# Patient Record
Sex: Male | Born: 1970 | Race: White | Hispanic: No | Marital: Married | Smoking: Current every day smoker
Health system: Southern US, Community
[De-identification: ages and names within clinical notes are randomized; demographics above are authoritative.]

## PROBLEM LIST (undated history)

## (undated) DIAGNOSIS — R002 Palpitations: Secondary | ICD-10-CM

## (undated) DIAGNOSIS — G8929 Other chronic pain: Secondary | ICD-10-CM

## (undated) DIAGNOSIS — D369 Benign neoplasm, unspecified site: Secondary | ICD-10-CM

## (undated) DIAGNOSIS — I456 Pre-excitation syndrome: Secondary | ICD-10-CM

## (undated) DIAGNOSIS — Z720 Tobacco use: Secondary | ICD-10-CM

## (undated) DIAGNOSIS — I10 Essential (primary) hypertension: Secondary | ICD-10-CM

## (undated) DIAGNOSIS — F419 Anxiety disorder, unspecified: Secondary | ICD-10-CM

## (undated) DIAGNOSIS — R160 Hepatomegaly, not elsewhere classified: Secondary | ICD-10-CM

## (undated) DIAGNOSIS — R000 Tachycardia, unspecified: Secondary | ICD-10-CM

## (undated) DIAGNOSIS — K649 Unspecified hemorrhoids: Secondary | ICD-10-CM

## (undated) DIAGNOSIS — K219 Gastro-esophageal reflux disease without esophagitis: Secondary | ICD-10-CM

## (undated) DIAGNOSIS — K760 Fatty (change of) liver, not elsewhere classified: Secondary | ICD-10-CM

## (undated) DIAGNOSIS — R55 Syncope and collapse: Secondary | ICD-10-CM

## (undated) DIAGNOSIS — I2510 Atherosclerotic heart disease of native coronary artery without angina pectoris: Secondary | ICD-10-CM

## (undated) DIAGNOSIS — M1990 Unspecified osteoarthritis, unspecified site: Secondary | ICD-10-CM

## (undated) DIAGNOSIS — G4731 Primary central sleep apnea: Secondary | ICD-10-CM

## (undated) HISTORY — DX: Palpitations: R00.2

## (undated) HISTORY — PX: ADENOIDECTOMY: SUR15

## (undated) HISTORY — DX: Hepatomegaly, not elsewhere classified: R16.0

## (undated) HISTORY — DX: Tachycardia, unspecified: R00.0

## (undated) HISTORY — PX: HAND SURGERY: SHX662

## (undated) HISTORY — PX: COLONOSCOPY: SHX174

## (undated) HISTORY — DX: Syncope and collapse: R55

## (undated) HISTORY — DX: Benign neoplasm, unspecified site: D36.9

## (undated) HISTORY — PX: APPENDECTOMY: SHX54

## (undated) HISTORY — DX: Unspecified hemorrhoids: K64.9

## (undated) HISTORY — PX: ATRIAL ABLATION SURGERY: SHX560

## (undated) HISTORY — DX: Anxiety disorder, unspecified: F41.9

## (undated) HISTORY — DX: Essential (primary) hypertension: I10

## (undated) HISTORY — PX: CARDIAC ELECTROPHYSIOLOGY STUDY AND ABLATION: SHX1294

## (undated) HISTORY — PX: SHOULDER SURGERY: SHX246

## (undated) HISTORY — PX: AV NODE ABLATION: SHX1209

## (undated) HISTORY — DX: Other chronic pain: G89.29

---

## 2000-02-10 ENCOUNTER — Encounter: Payer: Self-pay | Admitting: Emergency Medicine

## 2000-02-10 ENCOUNTER — Emergency Department (HOSPITAL_COMMUNITY): Admission: EM | Admit: 2000-02-10 | Discharge: 2000-02-10 | Payer: Self-pay | Admitting: Emergency Medicine

## 2002-04-04 ENCOUNTER — Emergency Department (HOSPITAL_COMMUNITY): Admission: EM | Admit: 2002-04-04 | Discharge: 2002-04-04 | Payer: Self-pay | Admitting: *Deleted

## 2002-04-04 ENCOUNTER — Encounter: Payer: Self-pay | Admitting: *Deleted

## 2004-08-24 ENCOUNTER — Inpatient Hospital Stay (HOSPITAL_COMMUNITY): Admission: AC | Admit: 2004-08-24 | Discharge: 2004-08-25 | Payer: Self-pay

## 2004-09-26 ENCOUNTER — Ambulatory Visit: Payer: Self-pay | Admitting: Internal Medicine

## 2004-10-22 ENCOUNTER — Ambulatory Visit: Payer: Self-pay | Admitting: Cardiology

## 2004-10-22 ENCOUNTER — Ambulatory Visit (HOSPITAL_COMMUNITY): Admission: RE | Admit: 2004-10-22 | Discharge: 2004-10-22 | Payer: Self-pay | Admitting: Internal Medicine

## 2004-11-14 ENCOUNTER — Encounter: Admission: RE | Admit: 2004-11-14 | Discharge: 2004-11-14 | Payer: Self-pay | Admitting: Orthopaedic Surgery

## 2004-11-16 ENCOUNTER — Encounter: Admission: RE | Admit: 2004-11-16 | Discharge: 2004-11-16 | Payer: Self-pay | Admitting: Orthopaedic Surgery

## 2004-12-03 ENCOUNTER — Encounter: Admission: RE | Admit: 2004-12-03 | Discharge: 2004-12-03 | Payer: Self-pay | Admitting: Orthopedic Surgery

## 2004-12-04 ENCOUNTER — Emergency Department (HOSPITAL_COMMUNITY): Admission: EM | Admit: 2004-12-04 | Discharge: 2004-12-04 | Payer: Self-pay | Admitting: Emergency Medicine

## 2005-08-15 ENCOUNTER — Ambulatory Visit: Payer: Self-pay | Admitting: Internal Medicine

## 2005-08-18 ENCOUNTER — Ambulatory Visit: Payer: Self-pay | Admitting: Cardiology

## 2005-09-15 DIAGNOSIS — I2510 Atherosclerotic heart disease of native coronary artery without angina pectoris: Secondary | ICD-10-CM

## 2005-09-15 HISTORY — DX: Atherosclerotic heart disease of native coronary artery without angina pectoris: I25.10

## 2005-10-06 ENCOUNTER — Ambulatory Visit (HOSPITAL_COMMUNITY): Admission: RE | Admit: 2005-10-06 | Discharge: 2005-10-06 | Payer: Self-pay | Admitting: Internal Medicine

## 2005-11-28 ENCOUNTER — Emergency Department (HOSPITAL_COMMUNITY): Admission: EM | Admit: 2005-11-28 | Discharge: 2005-11-29 | Payer: Self-pay | Admitting: Emergency Medicine

## 2006-04-04 ENCOUNTER — Inpatient Hospital Stay (HOSPITAL_COMMUNITY): Admission: EM | Admit: 2006-04-04 | Discharge: 2006-04-04 | Payer: Self-pay | Admitting: Internal Medicine

## 2006-04-04 ENCOUNTER — Ambulatory Visit: Payer: Self-pay | Admitting: *Deleted

## 2006-04-21 ENCOUNTER — Ambulatory Visit: Payer: Self-pay

## 2006-05-06 ENCOUNTER — Ambulatory Visit: Payer: Self-pay | Admitting: Internal Medicine

## 2006-05-14 ENCOUNTER — Inpatient Hospital Stay (HOSPITAL_BASED_OUTPATIENT_CLINIC_OR_DEPARTMENT_OTHER): Admission: RE | Admit: 2006-05-14 | Discharge: 2006-05-14 | Payer: Self-pay | Admitting: Cardiology

## 2006-05-14 ENCOUNTER — Ambulatory Visit: Payer: Self-pay | Admitting: Cardiology

## 2006-12-18 ENCOUNTER — Emergency Department (HOSPITAL_COMMUNITY): Admission: EM | Admit: 2006-12-18 | Discharge: 2006-12-18 | Payer: Self-pay | Admitting: Emergency Medicine

## 2008-02-09 ENCOUNTER — Encounter: Payer: Self-pay | Admitting: Cardiovascular Disease

## 2008-04-05 ENCOUNTER — Ambulatory Visit (HOSPITAL_COMMUNITY): Admission: RE | Admit: 2008-04-05 | Discharge: 2008-04-05 | Payer: Self-pay | Admitting: Urology

## 2008-04-12 ENCOUNTER — Ambulatory Visit: Payer: Self-pay | Admitting: Cardiovascular Disease

## 2008-05-07 ENCOUNTER — Ambulatory Visit: Payer: Self-pay | Admitting: Cardiology

## 2008-05-12 ENCOUNTER — Ambulatory Visit: Payer: Self-pay | Admitting: Cardiology

## 2008-12-23 DIAGNOSIS — Z8679 Personal history of other diseases of the circulatory system: Secondary | ICD-10-CM | POA: Insufficient documentation

## 2008-12-23 DIAGNOSIS — I10 Essential (primary) hypertension: Secondary | ICD-10-CM | POA: Insufficient documentation

## 2008-12-23 DIAGNOSIS — R55 Syncope and collapse: Secondary | ICD-10-CM | POA: Insufficient documentation

## 2008-12-23 DIAGNOSIS — R002 Palpitations: Secondary | ICD-10-CM

## 2008-12-23 DIAGNOSIS — F411 Generalized anxiety disorder: Secondary | ICD-10-CM | POA: Insufficient documentation

## 2009-01-26 ENCOUNTER — Encounter (INDEPENDENT_AMBULATORY_CARE_PROVIDER_SITE_OTHER): Payer: Self-pay | Admitting: *Deleted

## 2009-03-09 ENCOUNTER — Ambulatory Visit (HOSPITAL_COMMUNITY): Admission: RE | Admit: 2009-03-09 | Discharge: 2009-03-09 | Payer: Self-pay | Admitting: Family Medicine

## 2009-03-10 ENCOUNTER — Emergency Department (HOSPITAL_COMMUNITY): Admission: EM | Admit: 2009-03-10 | Discharge: 2009-03-10 | Payer: Self-pay | Admitting: Emergency Medicine

## 2009-03-17 ENCOUNTER — Emergency Department (HOSPITAL_COMMUNITY): Admission: EM | Admit: 2009-03-17 | Discharge: 2009-03-17 | Payer: Self-pay | Admitting: Emergency Medicine

## 2009-03-23 ENCOUNTER — Ambulatory Visit (HOSPITAL_COMMUNITY): Admission: RE | Admit: 2009-03-23 | Discharge: 2009-03-23 | Payer: Self-pay | Admitting: Internal Medicine

## 2009-04-17 ENCOUNTER — Ambulatory Visit (HOSPITAL_COMMUNITY): Admission: RE | Admit: 2009-04-17 | Discharge: 2009-04-17 | Payer: Self-pay | Admitting: Internal Medicine

## 2009-06-18 ENCOUNTER — Encounter: Payer: Self-pay | Admitting: Cardiovascular Disease

## 2009-07-11 ENCOUNTER — Encounter: Payer: Self-pay | Admitting: Cardiovascular Disease

## 2010-09-15 ENCOUNTER — Emergency Department (HOSPITAL_COMMUNITY)
Admission: EM | Admit: 2010-09-15 | Discharge: 2010-09-15 | Payer: Self-pay | Source: Home / Self Care | Admitting: Emergency Medicine

## 2010-10-06 ENCOUNTER — Encounter: Payer: Self-pay | Admitting: Urology

## 2010-10-06 ENCOUNTER — Encounter: Payer: Self-pay | Admitting: Internal Medicine

## 2010-10-17 NOTE — Consult Note (Signed)
Summary: [facility]   Imported By: Marylou Mccoy 09/25/2009 12:13:12  _____________________________________________________________________  External Attachment:    Type:   Image     Comment:   External Document

## 2010-10-17 NOTE — Consult Note (Signed)
Summary: [facility]   Imported By: Marylou Mccoy 09/25/2009 12:11:17  _____________________________________________________________________  External Attachment:    Type:   Image     Comment:   External Document

## 2010-10-23 ENCOUNTER — Encounter: Payer: Self-pay | Admitting: Internal Medicine

## 2010-10-23 ENCOUNTER — Other Ambulatory Visit: Payer: Medicare Other

## 2010-10-23 ENCOUNTER — Encounter (INDEPENDENT_AMBULATORY_CARE_PROVIDER_SITE_OTHER): Payer: Medicare Other | Admitting: Internal Medicine

## 2010-10-23 ENCOUNTER — Other Ambulatory Visit: Payer: Self-pay | Admitting: Internal Medicine

## 2010-10-23 DIAGNOSIS — R002 Palpitations: Secondary | ICD-10-CM

## 2010-10-23 DIAGNOSIS — R61 Generalized hyperhidrosis: Secondary | ICD-10-CM | POA: Insufficient documentation

## 2010-10-23 DIAGNOSIS — I10 Essential (primary) hypertension: Secondary | ICD-10-CM

## 2010-10-23 LAB — BASIC METABOLIC PANEL
BUN: 12 mg/dL (ref 6–23)
Chloride: 105 mEq/L (ref 96–112)
GFR: 94.87 mL/min (ref >60.00)
Potassium: 4.7 mEq/L (ref 3.5–5.1)
Sodium: 139 mEq/L (ref 135–145)

## 2010-10-23 LAB — MAGNESIUM: Magnesium: 2 mg/dL (ref 1.5–2.5)

## 2010-10-23 LAB — TSH: TSH: 1.57 u[IU]/mL (ref 0.35–5.50)

## 2010-10-31 NOTE — Assessment & Plan Note (Signed)
Summary: ec6- gd/per pt call/mb/rs from bumplist/gd**12/20 notes on fi...  Medications Added TOPROL XL 100 MG XR24H-TAB (METOPROLOL SUCCINATE) Take one by mouth two times a day LISINOPRIL 5 MG TABS (LISINOPRIL) Take one tablet by mouth daily KLOR-CON M20 20 MEQ CR-TABS (POTASSIUM CHLORIDE CRYS CR) take one tablet once daily      Allergies Added: ! MORPHINE  CC:  pt is having palpitations and a pounding feeling in his chest and reports that he is getting diaphoretic.  Marland Kitchen  History of Present Illness: Mr.Timothy [name] is seen as a consultation from Dr. Carlena Sax because of palpitations and syncope.  I met the patient many years ago and has seen occasionally over the years for similar complaints. Initially he was found to have supraventricular tachycardia and atrial fibrillation and underwent catheter ablation at [facility]. The records describe WPW; I don't remember whether he had concealed or manifest accessory pathways. He describes a second ablation procedure sometime thereafter and again I don't have records and I don't know what was done.  Over the last 15 years, however, he has been seen multiple times by me and colleagues because of palpitations chest pains dizziness. As best as I can tell from the records available, no arrhythmia has been documented.  He comes in today with complaints over the last 6 months his symptoms have worsened. He describes these as a large strong heartbeat which he has recognized for many many years followed by one of 2 palpitations syndrome. The first is a "strong heart heartbea" with which he is modestly symptomatic associated with fatiguethat lasts for the next day or so and a "weak heart beat" which is associated with nausea diaphoresis pallor and presyncope. He also had a syncopal episode with one of these events a couple of months ago. He ended up sitting down and did not collapse to the ground.  He also has a history of hypertension for which he is taking therapy with beta  blockers which have been used for the palpitations as well and a diuretic. He has a history of some type of edema and low potassium was also been on potassium supplementation.  Recent blood work has not been drawn that he recalls  He also notes that he isn't having night sweats for the last couple of months on his head he is soaking his garments   Current Medications (verified): 1)  Toprol Xl 100 Mg Xr24h-Tab (Metoprolol Succinate) .... Once Daily 2)  Aspirin Adult Low Strength 81 Mg Tbec (Aspirin) .... Once Daily 3)  Hydrochlorothiazide 25 Mg Tabs (Hydrochlorothiazide) .... Once Daily 4)  Klor-Con M20 20 Meq Cr-Tabs (Potassium Chloride Crys Cr) .... Take One Tablet Once Daily  Allergies (verified): 1)  ! Morphine  Past History:  Past Medical History: Last updated: 12/23/2008 PAST MEDICAL HISTORY:  1.  Tachycardia status post ablation x2.  2.  Palpitations/PVC's.  3.  History of syncope.  4.  Anxiety.  5.  Hypertension.  Family History: Last updated: 12/23/2008  Father with history of coronary artery disease status post CABG, not premature.  Social History: Last updated: 12/23/2008 Tobacco Use - Yes. --1/2 to 1 ppd  Past Surgical History: Ablation  Review of Systems       full review of systems was negative apart from a history of present illness and past medical history.   Vital Signs:  Patient profile:   40 year old male Height:      72 inches Weight:      193 pounds BMI:  26.27 Pulse rate:   78 / minute Pulse rhythm:   regular BP sitting:   142 / 84  (left arm) Cuff size:   regular  Vitals Entered By: Judithe Modest CMA (October 23, 2010 11:43 AM)  Physical Exam  General:  Alert and oriented middle-aged Caucasian male appearing his stated age in no acute distress. HEENT  normal . Neck veins were flat; carotids brisk and full without bruits. No lymphadenopathy. Back without kyphosis. Lungs clear. Heart sounds regular without murmurs or gallops. PMI  nondisplaced. Abdomen soft with active bowel sounds without midline pulsation or hepatomegaly. Femoral pulses and distal pulses intact. Extremities were without clubbing cyanosis or edemaSkin warm and drywith multiple tattoos Neurological exam grossly normal; affect is anxious appearing    EKG  Procedure date:  10/23/2010  Findings:      sinus rhythm at 78 And 12.09/2007/24 one Axis is 60  Impression & Recommendations:  Problem # 1:  PALPITATIONS (ICD-785.1) the patient has ongoing problems with palpitations. He is exceedingly anxious about this. He says he thinks he is going to die". I will use an event recorder to try to elucidate the mechanism of his palpitations. It sounds like he has PVCs. There may also be a secondary rhythm issue following.  Orders: Event (Event) TLB-BMP (Basic Metabolic Panel-BMET) (80048-METABOL) TLB-TSH (Thyroid Stimulating Hormone) (84443-TSH) TLB-Magnesium (Mg) (83735-MG)  Problem # 2:  SYNCOPE (ICD-780.2) He has had recurrent syncope and dizziness. There are accompanying epiphenomenon suggesting early mediated syndrome which may be provoked by an arrhythmia if one can be identified or other non-arrhythmic triggers. Because of the potential for intravascular depletion to be provocative, we will discontinue his hydrochlorothiazide and replace it with lisinopril for his blood pressure. At his next visit he will need a metabolic profile. His updated medication list for this problem includes:    Toprol Xl 100 Mg Xr24h-tab (Metoprolol succinate) .Marland Kitchen... Take one by mouth two times a day    Aspirin Adult Low Strength 81 Mg Tbec (Aspirin) ..... Once daily    Lisinopril 5 Mg Tabs (Lisinopril) .Marland Kitchen... Take one tablet by mouth daily  Problem # 3:  NIGHT SWEATS (ICD-780.8) with his night sweats and his increased arrhythmia we will check his thyroid panel. I will forward these results to Dr. Carlena Sax  Problem # 4:  ANXIETY STATE, UNSPECIFIED (ICD-300.00) This is  undoubtedly a huge part of the problem. In the event that no arrhythmias identified, further therapy for anxiety will be paramount. Even if arrhythmias identified, I would suggest biofeedback or some type of other anti-anxiety therapy to help him live with the problem.  Other Orders: Pulmonary Referral (Pulmonary)  Patient Instructions: 1)  Your physician recommends that you schedule a follow-up appointment in: 5 weeks 2)  Your physician has recommended you make the following change in your medication: Stop Hydrochlorothiazide.  Start Lisinopril 5 mg by mouth daily. 3)  Your physician has recommended that you wear an event monitor.  Event monitors are medical devices that record the heart's electrical activity. Doctors most often use these monitors to diagnose arrhythmias. Arrhythmias are problems with the speed or rhythm of the heartbeat. The monitor is a small, portable device. You can wear one while you do your normal daily activities. This is usually used to diagnose what is causing palpitations/syncope (passing out). 4)  Your physician has recommended that you have a sleep study.  This test records several body functions during sleep, including:  brain activity, eye movement, oxygen and carbon dioxide  blood levels, heart rate and rhythm, breathing rate and rhythm, the flow of air through your mouth and nose, snoring, body muscle movements, and chest and belly movement. Prescriptions: LISINOPRIL 5 MG TABS (LISINOPRIL) Take one tablet by mouth daily  #30 x 11   Entered by:   Dossie Arbour, RN, BSN   Authorized by:   Nathen May, MD, [facility]   Signed by:   Dossie Arbour, RN, BSN on 10/23/2010   Method used:   Electronically to        [facility]. [address] [phone]* (retail)       [address] [place], [place]  [address]       Ph: [phone]       Fax: [phone]   RxID:   [id number]   Appended Document: ec6- gd/per pt call/mb/rs from bumplist/gd**12/20 notes on  fi... also spent about 5 min discussing stompping smoking

## 2010-11-06 ENCOUNTER — Ambulatory Visit (INDEPENDENT_AMBULATORY_CARE_PROVIDER_SITE_OTHER): Payer: Medicare Other | Admitting: Pulmonary Disease

## 2010-11-06 ENCOUNTER — Encounter: Payer: Self-pay | Admitting: Pulmonary Disease

## 2010-11-06 DIAGNOSIS — R61 Generalized hyperhidrosis: Secondary | ICD-10-CM

## 2010-11-06 DIAGNOSIS — R55 Syncope and collapse: Secondary | ICD-10-CM

## 2010-11-12 NOTE — Assessment & Plan Note (Signed)
Summary: [facility] APPT ONLY/CB   Allergies: 1)  ! Morphine   Sleep Study  Procedure date:  11/07/2010  Findings:      Portable sleep study FLow evaluation time - 3H (6A to 9A) AHI 10/h indicative of mild sleep disordered brathing but predominant central apneas & a pattern s/o cheyne stokes' respiration. This can be seen in patients  with h/o heart failure or stroke.Please corelate clinically. Suggest in lab polysomnogram to further investigate.  Other Orders: Sleep Std Airflow/Heartrate and O2 SAT unattended ([address])

## 2010-11-15 ENCOUNTER — Encounter (INDEPENDENT_AMBULATORY_CARE_PROVIDER_SITE_OTHER): Payer: Self-pay | Admitting: *Deleted

## 2010-11-21 NOTE — Procedures (Signed)
Summary: [facility]  [facility]   Imported By: Lester  11/14/2010 10:33:26  _____________________________________________________________________  External Attachment:    Type:   Image     Comment:   External Document

## 2010-11-21 NOTE — Letter (Signed)
Summary: Appointment - Reschedule  [facility], Main Office  [address]   [place], [place] [address]   Phone: [phone]  Fax: [phone]     November 15, 2010 MRN: [id number]   Timothy [name] [address] [place], [place]  [address]   Dear Mr. BOB,   Due to a change in our office schedule, your appointment on    12-05-2010                at 1:55 p.m.   must be changed.  It is very important that we reach you to reschedule this appointment. We look forward to participating in your health care needs. Please contact us at the number listed above at your earliest convenience to reschedule this appointment.     Sincerely,     Lorne Skeens  [facility] Scheduling Team

## 2010-11-22 ENCOUNTER — Encounter: Payer: Self-pay | Admitting: Internal Medicine

## 2010-12-05 ENCOUNTER — Ambulatory Visit: Payer: Medicare Other | Admitting: Internal Medicine

## 2010-12-26 ENCOUNTER — Telehealth: Payer: Self-pay | Admitting: *Deleted

## 2010-12-26 NOTE — Telephone Encounter (Signed)
LEFT MESSAGE RE MONITOR RESULTS PER DR Graciela Husbands PVC'S ONLY  .Timothy [name]

## 2010-12-26 NOTE — Telephone Encounter (Signed)
lmtcb ./cy 

## 2010-12-27 NOTE — Telephone Encounter (Signed)
LMTCB ./CY 

## 2010-12-30 ENCOUNTER — Encounter: Payer: Self-pay | Admitting: *Deleted

## 2010-12-30 NOTE — Telephone Encounter (Signed)
SEVERAL ATTEMPTS HAVE BEEN MADE VIA PHONE WITH MONITOR RESULTS  NOTE MAILED TO PT WITH RESULTS./CY

## 2010-12-31 ENCOUNTER — Encounter: Payer: Self-pay | Admitting: Internal Medicine

## 2011-01-28 NOTE — Letter (Signed)
May 12, 2008    Madelin Rear. Sherwood Gambler, MD  [address]  [place], [place] [address]   RE:  ADRIC, WREDE  MRN:  [id number]  /  DOB:  1971/05/23   Dear Peyton Najjar,   It was my pleasure evaluating Mr. Timothy [name] in the office today in  consultation at your request for palpitations and episodic diaphoresis  and dyspnea.  Mr. Samaan has previously been followed by Dr. Graciela Husbands.  He  has a history of PSVT for which two ablation procedures were performed  at [facility] in 90s.  He subsequently has intermittently been  troubled by palpitations and chest pain.  He was completely evaluated in  mid 2007 with an event recorder and cardiac catheterization.  No  significant arrhythmias were diagnosed.  He was found to have  nonobstructive coronary artery disease with a 50% lesion in the first  diagonal and 20-30% lesions elsewhere.  He had been maintained on  disability for approximately 8 years.  Dr. Graciela Husbands advised him to return  to work.  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  He describes episodes where he feels a pulse  deficit in his wrist.  There are times when he becomes diaphoretic and  dyspneic.  The relationship between his apparent arrhythmia and those  symptoms is not entirely clear.  Recent blood work included a CBC,  chemistry profile, TSH, cortisol levels, urine studies for carcinoid and  urine studies for pheochromocytoma.  Results were essentially negative.  He had mildly low p.m. cortisol.   Past medical history is otherwise notable for hypertension, which has  reportedly been difficult to control at times.  Blood pressures have  always been well within the normal range when seen in the office.  He  has depression.  He is a cigarette smoker with significant continuing  consumption.  He had an implanted loop recorder in 2006 that apparently  was unproductive.   Current medications include,  1. Toprol-XL 100 mg b.i.d.  2. Aspirin 81 mg  daily.  3. Hydrochlorothiazide 25 mg daily.  4. Wellbutrin 150 mg daily.  5. KCl 20 mEq daily.  6. Celexa 40 mg daily.   He has no known drug allergies.   Review of systems, past medical, social history and family history were  updated.  There are no notable additions except as described above.   PHYSICAL EXAMINATION:  GENERAL:  On exam, anxious and somewhat depressed-  appearing gentleman in no acute distress.  VITAL SIGNS:  The weight is 215, 32 pounds more than in 2006.  Blood  pressure 105/70, heart rate 65 and regular, respirations 16.  HEENT:  EOMs full; normal oral mucosa.  NECK:  No jugular venous distention; normal carotid upstrokes without  bruits.  ENDOCRINE:  No thyromegaly.  HEMATOPOIETIC:  No adenopathy.  LUNGS:  Clear.  CARDIAC:  Normal first and second heart sounds.  ABDOMEN:  Soft and nontender; normal bowel sounds; no organomegaly.  EXTREMITIES:  No edema; normal distal pulses.   Mr. Tabbert carried an event recorder for 3 weeks.  He reported no  symptoms during that interval.  His rhythm was sinus throughout except  for a single missed beats with a 2.3-second pause.  This appeared to  represent either sinus exit block or sinus pause.   IMPRESSION:  Mr. Steeves has a remote history of Wolff-Parkinson-White,  paroxysmal supraventricular tachycardia, and atrial fibrillation.  It  appears that these arrhythmias were cured with radiofrequency ablation.  He has had subsequent chest pain, palpitations, and dyspnea for which no  specific etiology has been apparent.  I suggest that most of this is due  to anxiety.  That was also Dr. Odessa Fleming impression when Mr. Charette was  last seen in 2007.  Since he has had some bradycardia with Toprol, his  dose will be decreased to 100 mg daily.  Since he did have  nonobstructive coronary artery disease and has a mildly abnormal lipid  profile, treatment with a statin is warranted.  He will start  simvastatin at a dose of 40 mg  daily.  We will check a lipid panel now  in a few months from now at which time, I will see him again in the  office.  He will call for any more impressive symptoms.    Sincerely,      Gerrit Friends. Dietrich Pates, MD, [facility]  Electronically Signed    RMR/MedQ  DD: 05/12/2008  DT: 05/12/2008  Job #: [id number]

## 2011-01-31 NOTE — Discharge Summary (Signed)
NAMETHELBERT, GARTIN               ACCOUNT NO.:  [id number]   MEDICAL RECORD NO.:  [id number]          PATIENT TYPE:  INP   LOCATION:  3709                         FACILITY:  [facility]   PHYSICIAN:  Doylene Canning. Ladona Ridgel, M.D.  DATE OF BIRTH:  1971-08-11   DATE OF ADMISSION:  04/04/2006  DATE OF DISCHARGE:  04/04/2006                                 DISCHARGE SUMMARY   SUMMARY OF HISTORY:  Mr. Schoeneck is a 40 year old male who presented to the  emergency room stating that he had not felt right all day and described some  diaphoresis and episodes of palpitations.  He stated that he fell against  his dresser and dislocated the finger on his left hand.  He did not have  true loss of consciousness.  He did describe some chest discomfort with  palpitations but denied shortness of breath.  Since that time he has not had  any further problems with chest discomfort or palpitations and feels like he  is at his baseline now.   PAST MEDICAL HISTORY:  Notable for some type of tachyarrhythmia ablation at  [facility] x2, history of anxiety, syncope and hypertension   LABORATORY DATA:  Chest x-ray did not show any active disease.  EKG showed  normal sinus rhythm, normal axis, early R-wave, nonspecific ST-T wave  changes.  Admission H&H was 14.8 and 43.0, normal indices, platelets 309,  WBC 8.7.  Sodium 137, potassium 3.1, BUN 18, creatinine 0.9, glucose 92.  Subsequent potassium on the day of discharge was 3.3.  CK, MB and troponins  were negative x2.  BNP was 51.  Total cholesterol was 204, triglycerides  108, HDL 41, LDL 141.   HOSPITAL COURSE:  Overnight Mr. Mitten did not have any further chest  discomfort or palpitations.  Telemetry did not show any evidence of  dysrhythmias.  After review, Dr. Ladona Ridgel felt that the patient could be  discharged home on his home medications with the addition of potassium given  his hypokalemia.   DISCHARGE DIAGNOSES:  1.  Near syncope.  2.  Hypokalemia.  3.   Tobacco use.   DISPOSITION:  The patient is discharged home.  He is asked to maintain low  salt, fat and cholesterol diet.  His activities are not restricted.  He was  asked to bring all his medications to all appointments.   His medications include:  1.  Wellbutrin 150 mg daily.  2.  Hydrochlorothiazide 25 mg daily.  3.  Toprol XL 100 mg b.i.d.  4.  He received a new prescription for potassium 20 mEq daily.   He was advised no smoking or tobacco products.  I have faxed the office our  request to call the patient with arrangements for an outpatient stress  Myoview.  This could be done either in [facility] or with [facility] with  subsequent follow-up.  We have also asked him to call Dr. Sherwood Gambler for a 1-2  weeks appointment.  Our cardiology office will need to obtain the records  from [facility] in regards to his past medical history.      Joellyn Rued,  P.A. [facility]    ______________________________  Doylene Canning. Ladona Ridgel, M.D.    EW/MEDQ  D:  04/04/2006  T:  04/04/2006  Job:  [id number]
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
## 2011-01-31 NOTE — H&P (Signed)
Timothy [name], Timothy [name]               ACCOUNT NO.:  [id number]   MEDICAL RECORD NO.:  [id number]          PATIENT TYPE:  INP   LOCATION:  3709                         FACILITY:  [facility]   PHYSICIAN:  Rod Holler, MD      DATE OF BIRTH:  1971/01/11   DATE OF ADMISSION:  04/04/2006  DATE OF DISCHARGE:                                HISTORY & PHYSICAL   CHIEF COMPLAINT:  Chest tightness.   HISTORY OF PRESENT ILLNESS:  Timothy [name] is a 40 year old male with a  pertinent past medical history of an ablation for some type of tachycardia  performed at [facility] twice in the past. He presented to an outside  hospital tonight with a complaint of near syncope. The patient had not felt  right all day, felt somewhat diaphoretic during the day, and had had  episodes of palpitations during the day. This afternoon, the patient had an  episode of near syncope, no true loss of consciousness, nearly passed out,  and fell against his dresser. When he did that, the patient dislocated his  finger on this left hand. As stated above, the patient did not have a true  loss of consciousness. With this episode, he did have some chest tightness,  along with palpitations. He had no shortness of breath. Since that time, he  has had no further episodes of palpitations and no further chest discomfort.  The patient states that he feels like he is at his baseline now. The patient  has not had any episode of feeling light headed when he goes from the  sitting to the standing position. The patient has had no GI or other  illnesses in the past couple of days.   PAST MEDICAL HISTORY:  1.  Tachycardia status post ablation x2.  2.  Palpitations/PVC's.  3.  History of syncope.  4.  Anxiety.  5.  Hypertension.   MEDICATIONS:  1.  Wellbutrin 150 mg p.o. daily.  2.  Hydrochlorothiazide 25 mg p.o. daily.  3.  Toprol XL 100 mg p.o. b.i.d.   ALLERGIES:  NO KNOWN DRUG ALLERGIES.   SOCIAL HISTORY:  The patient smokes  1/2 to 1 pack per day.   FAMILY HISTORY:  Father with history of coronary artery disease status post  CABG, not premature.   REVIEW OF SYSTEMS:  All systems reviewed in details and are negative except  as noted in the history of present illness.   PHYSICAL EXAMINATION:  VITAL SIGNS:  Temperature 97.9, heart rate 90,  respiratory rate 18, blood pressure 144/80. Oxygen saturation 97% on room  air.  GENERAL:  A well developed, well nourished male. Alert and oriented times  three. No apparent distress.  HEENT:  Normocephalic and atraumatic. Pupils are equal, round, and reactive  to light. Extraocular movements intact. Oropharynx clear.  NECK:  Supple. No adenopathy, no JVD, no carotid bruits.  CHEST:  Lungs clear to auscultation bilaterally with equal bilateral breath  sounds.  CARDIOVASCULAR:  Regular rhythm. Normal rate. Normal S1 and S2. A 1 to 2  over 6 systolic ejection murmur,  2+ peripheral  pulses. No femoral bruits.  ABDOMEN:  Soft, nontender, and nondistended. Active bowel sounds. No  hepatosplenomegaly.  EXTREMITIES:  No clubbing, cyanosis, or edema.  NEUROLOGIC:  No focal deficits.  SKIN:  No rashes.   LABORATORY DATA:  EKG shows a normal sinus rhythm without any ST or T wave  changes.   At the outside hospital, white blood cell count 8.7. Hematocrit 43. Platelet  count 309,000. Sodium 131, potassium 3.1, chloride 99, bicarb 25, BUN 18,  creatinine 0.9, glucose 92. Myoglobin 59, CK MB 2.4, troponin less than  0.05.   IMPRESSION:  Timothy [name] is a 40 year old male admitted with an episode of  chest discomfort, palpitations, and episode of near-syncope.   PLAN:  1.  CARDIOVASCULAR:  Will admit the patient to a telemetry bed, rule out      with serial cardiac enzymes, place him on his home dose of      hydrochlorothiazide and Toprol XL. Will place him on an aspirin daily.      Will get a lipid panel, and daily EKG, along with an EKG upon admission      to the hospital.   2.  PSYCHIATRIC:  Will continue the patient on his home dose of Wellbutrin.  3.  PULMONARY:  Will get a PA and lateral chest x-ray.  4.  ENDOCRINE:  Check thyroid function test.  5.  FLUIDS FOR ELECTROLYTES AND NUTRITION:  The patient's potassium was 3.1      at the outside hospital and was given oral potassium there. We will      recheck a BMP in the morning along with checking a magnesium level.  6.  PROPHYLAXIS:  Since the patient had an episode of near syncope at home,      we will make the patient a fall risk.      Rod Holler, MD  Electronically Signed     TRK/MEDQ  D:  04/04/2006  T:  04/04/2006  Job:  [id number]

## 2011-01-31 NOTE — Cardiovascular Report (Signed)
NAMESHAHIN, KNIERIM NO.:  [id number]   MEDICAL RECORD NO.:  [id number]          PATIENT TYPE:  OIB   LOCATION:  1962                         FACILITY:  [facility]   PHYSICIAN:  Jonelle Sidle, MD DATE OF BIRTH:  05/20/1971   DATE OF PROCEDURE:  DATE OF DISCHARGE:  05/14/2006                              CARDIAC CATHETERIZATION   PRIMARY CARDIOLOGIST:  Dr. Hurman Horn.   INDICATIONS:  Mr. Banh  is a 40 year old male with a history of prior  electrophysiology ablation at [facility] several years  ago.  He has had intermittent chest discomfort as well as palpitations and  underwent a recent exercise myocardial perfusion study.  This study was  limited by inadequate heart rate response and the fact that the patient had  the leg discomfort actually causing him to jump off of the treadmill during  the test.  The perfusion images were read by Dr. Myrtis Ser to show an ejection  fraction of 47% with some decrease in tracer activity at the apex.  The  patient is now referred for diagnostic cardiac catheterization to clearly  define the coronary anatomy.  The potential risks and benefits of the  procedure were explained to the patient in advance and informed consent was  obtained.   PROCEDURES PERFORMED:  1. Left heart catheterization  2. Selective coronary angiography.  3. Left ventriculography.   ACCESS AND EQUIPMENT:  The area about the right femoral artery was  anesthetized with 1% lidocaine and a 4-French sheath was placed in the right  femoral artery via the modified Seldinger technique following a single  anterior wall puncture.  Standard preformed 4-French JL-4 and 3-D RC  catheters were used for selective coronary angiography and an angled pigtail  catheter was used for left heart catheterization and left ventriculography.  All exchanges were made over wire and the patient tolerated the procedure  well without immediate  complications.  A  total of 90 mL Omnipaque were  used.   HEMODYNAMIC RESULTS:  Aorta 102/77 mmHg.  Left ventricle 102/3 mmHg.   ANGIOGRAPHIC FINDINGS:  1. Left main coronary artery is relatively long and gives rise to the left      anterior descending and circumflex coronary arteries.  There is no      significant flow-limiting coronary atherosclerosis noted.  2. Left anterior descending is a medium caliber vessel extending to the      apex.  Septal perforators are noted in the midvessel segment and prior      to this very proximally are two  diagonal branches.  No significant      flow-limiting disease is noted within the left anterior descending.      Within the first diagonal there is a bifurcation and in the inferior      subbranch is a 50%  stenosis.  This vessel size is less than 2 mm.  3. The circumflex coronary artery is a medium to large caliber vessel with      three obtuse marginal branches.  There is no significant flow-limiting      coronary atherosclerosis  noted.  4. The right coronary artery is a dominant vessel that is medium to large      in caliber.  There are minor luminal irregularities including      approximately 20% stenosis in the posterolateral system.   Left ventriculography was performed in the RAO projection and reveals an  ejection fraction approximately 50-55% without focal wall motion abnormality  and with no significant mitral regurgitation.   Diagnoses  1. Mild branch vessel coronary atherosclerosis with no major flow-limiting      disease in the larger epicardial vessels.  2. Left ventricular ejection fraction of approximately 50-55% with no      significant mitral regurgitation and a left ventricular end-diastolic      pressure of 3 mmHg.   DISCUSSION:  I reviewed the results with the patient, his family and with  Dr. Graciela Husbands.  This point would anticipate risk factor modification strategies.  There is no clear revascularization need at this point.  Follow-up will  be  with Dr. Graciela Husbands  as arranged.      Jonelle Sidle, MD  Electronically Signed     SGM/MEDQ  D:  05/14/2006  T:  05/14/2006  Job:  [id number]   cc:   Duke Salvia, MD, [facility]  Madelin Rear. Sherwood Gambler, MD

## 2011-01-31 NOTE — Op Note (Signed)
NAMEKAREN, KINNARD NO.:  [id number]   MEDICAL RECORD NO.:  [id number]          PATIENT TYPE:  OIB   LOCATION:  2899                         FACILITY:  [facility]   PHYSICIAN:  Duke Salvia, M.D.  DATE OF BIRTH:  December 01, 1970   DATE OF PROCEDURE:  10/23/2003  DATE OF DISCHARGE:                                 OPERATIVE REPORT   PREOPERATIVE DIAGNOSIS:  Syncope with previously implanted loop.   POSTOPERATIVE DIAGNOSIS:  Syncope with previously implanted loop.   PROCEDURE:  Explantation of a previously implanted loop recorder.   Following obtaining informed consent, the patient was brought to the  electrophysiology laboratory and placed on the fluoroscopic table in the  supine position.  After routine prep and drape, lidocaine was infiltrated  along the line of the previous incision and carried down to the layer of the  prepectoral fascia using electrocautery and sharp dissection.  The pocket  was opened.  The device was explanted.  The anchoring suture had previously  come loose and had been in the subcutaneous tissue.  The patient's pocket  was then copiously irrigated with antibiotic-containing saline solution.  The anterior and posterior aspects were opposed.  The wound was closed in  three layers in the normal fashion.  Antibiotics had been flushed  thoroughly.  The patient tolerated the procedure without apparent  complication.      SCK/MEDQ  D:  10/22/2004  T:  10/22/2004  Job:  [id number]   cc:   Electrophysiology Laboratory

## 2011-01-31 NOTE — Assessment & Plan Note (Signed)
[facility]                           ELECTROPHYSIOLOGY OFFICE NOTE   NAME:Timothy [name], Timothy [name]                      MRN:          [id number]  DATE:05/06/2006                            DOB:          Nov 21, 1970    Mr. Giovanni is a 40 year old gentleman with a history of chest pains,  palpitations. He underwent ablation at [facility] years and years ago. He  continues to have concerns about chest pain.  He underwent stress-testing  which was negative apart from the limitations of his maximum heart rate at  76%.  He stopped the treadmill at that point because he had cramping in his  calves which is a recurring issue with exertion. He does smoke.   MEDICATIONS:  Include Toprol 100 b.i.d., aspirin and hydrochlorothiazide as  well as Wellbutrin.   PHYSICAL EXAMINATION:  VITAL SIGNS:  His blood pressure is 122/81, pulse is  65.  LUNGS:  Clear.  HEART:  Regular heart sounds.  EXTREMITIES:  Without edema.   IMPRESSION:  1. Atypical chest pains with negative Myoview.  2. Heart rate limited probably by his beta blocker.  3. Lower extremity cramping in his calves, question claudication.  4. History of palpitations with antecedent history of ablation for      multiple pathways in WTPW.   Mr. Harpster continues to be very anxious about his chest pain issues. There  may be an anxiety disorder component of this. Because this is limiting to  his work and at his age, he continues to be very anxious about this. We  decided to pursue catheterization to definitively exclude coronary artery  disease so that he can get on with his life.  We discussed the potential  benefit as well as the potential risks and he is agreeable to proceeding.   We will also undertake ABIs to look at his calf cramping.   We have advised him to stop smoking. He is to follow up with Dr. Sherwood Gambler. I  think the other thing that may be worth doing is to decrease his Toprol to  50 mg twice a day and  will talk with him about that.                                   Duke Salvia, MD, [facility]   SCK/MedQ  DD:  05/06/2006  DT:  05/06/2006  Job #:  [id number]   cc:   Madelin Rear. Sherwood Gambler, MD

## 2011-05-30 ENCOUNTER — Emergency Department (HOSPITAL_COMMUNITY)
Admission: EM | Admit: 2011-05-30 | Discharge: 2011-05-30 | Disposition: A | Discharge status: Home / Self Care | Payer: Medicare Other | Attending: Emergency Medicine | Admitting: Emergency Medicine

## 2011-05-30 ENCOUNTER — Encounter (HOSPITAL_COMMUNITY): Payer: Self-pay | Admitting: *Deleted

## 2011-05-30 ENCOUNTER — Other Ambulatory Visit: Payer: Self-pay

## 2011-05-30 ENCOUNTER — Emergency Department (HOSPITAL_COMMUNITY): Payer: Medicare Other

## 2011-05-30 DIAGNOSIS — E876 Hypokalemia: Secondary | ICD-10-CM | POA: Insufficient documentation

## 2011-05-30 DIAGNOSIS — Z7982 Long term (current) use of aspirin: Secondary | ICD-10-CM | POA: Insufficient documentation

## 2011-05-30 DIAGNOSIS — R079 Chest pain, unspecified: Secondary | ICD-10-CM | POA: Insufficient documentation

## 2011-05-30 DIAGNOSIS — F17200 Nicotine dependence, unspecified, uncomplicated: Secondary | ICD-10-CM | POA: Insufficient documentation

## 2011-05-30 DIAGNOSIS — I498 Other specified cardiac arrhythmias: Secondary | ICD-10-CM | POA: Insufficient documentation

## 2011-05-30 DIAGNOSIS — I10 Essential (primary) hypertension: Secondary | ICD-10-CM

## 2011-05-30 LAB — BASIC METABOLIC PANEL
CO2: 28 mEq/L (ref 19–32)
Chloride: 90 mEq/L — ABNORMAL LOW (ref 96–112)
Glucose, Bld: 100 mg/dL — ABNORMAL HIGH (ref 70–99)
Potassium: 3.4 mEq/L — ABNORMAL LOW (ref 3.5–5.1)
Sodium: 133 mEq/L — ABNORMAL LOW (ref 135–145)

## 2011-05-30 LAB — CBC
Hemoglobin: 18.2 g/dL — ABNORMAL HIGH (ref 13.0–17.0)
Platelets: 312 10*3/uL (ref 150–400)
RBC: 5.98 MIL/uL — ABNORMAL HIGH (ref 4.22–5.81)
WBC: 15 10*3/uL — ABNORMAL HIGH (ref 4.0–10.5)

## 2011-05-30 LAB — TROPONIN I: Troponin I: <0.3 ng/mL (ref <0.30)

## 2011-05-30 MED ORDER — LABETALOL HCL 5 MG/ML IV SOLN
20.0000 mg | Freq: Once | INTRAVENOUS | Status: AC
  Administered 2011-05-30: 20 mg via INTRAVENOUS
  Filled 2011-05-30: qty 4

## 2011-05-30 MED ORDER — POTASSIUM CHLORIDE 20 MEQ PO PACK: 40.0000 meq | PACK | Freq: Once | ORAL | Status: DC

## 2011-05-30 NOTE — ED Provider Notes (Signed)
History     CSN: [id number] Arrival date & time: 05/30/2011  3:29 PM   Chief Complaint  Patient presents with  . Hypertension   Reports tingling in fingers of both hands and intermittent chest pain lasting 1 minute anterior nonradiating for past 3 days seen by Dr.Fusco earlier today blood pressure determined to be elevated sent here for further evaluation treated with his usual blood pressure medications. Presently feels anxious. Admits to noncompliance with medications for several days took his full dose of blood pressure medications today nothing makes symptoms better or worse. No chest pain presently  (Include location/radiation/quality/duration/timing/severity/associated sxs/prior treatment) HPI   Past Medical History  Diagnosis Date  . Tachycardia   . Palpitations   . Syncope   . Anxiety   . HTN (hypertension)      Past Surgical History  Procedure Date  . Atrial ablation surgery     Family History  Problem Relation Age of Onset  . Coronary artery disease Father     cabg    History  Substance Use Topics  . Smoking status: Current Everyday Smoker  . Smokeless tobacco: Not on file  . Alcohol Use: Not on file      Review of Systems  Constitutional: Negative.   HENT: Negative.   Respiratory: Negative.   Cardiovascular: Positive for chest pain.       Diaphoresis  Gastrointestinal: Negative.   Musculoskeletal: Negative.   Skin: Negative.   Neurological:       Paresthesias  Hematological: Negative.   Psychiatric/Behavioral: Negative.        Anxiety    Allergies  Morphine  Home Medications   Current Outpatient Rx  Name Route Sig Dispense Refill  . ASPIRIN 81 MG PO TABS Oral Take 81 mg by mouth daily.      Marland Kitchen LISINOPRIL 5 MG PO TABS Oral Take 5 mg by mouth daily.      Marland Kitchen METOPROLOL SUCCINATE 100 MG PO TB24 Oral Take 100 mg by mouth 2 (two) times daily.      Marland Kitchen POTASSIUM CHLORIDE CRYS CR 20 MEQ PO TBCR Oral Take 20 mEq by mouth daily.        Physical  Exam    BP 164/117  Pulse 119  Temp 98.4 F (36.9 C)  Resp 20  Ht 5\' 11"  (1.803 m)  Wt 187 lb (84.823 kg)  BMI 26.08 kg/m2  SpO2 95%  Physical Exam  Nursing note and vitals reviewed. Constitutional: He appears well-developed and well-nourished.       Anxious  HENT:  Head: Normocephalic and atraumatic.  Eyes: Conjunctivae are normal. Pupils are equal, round, and reactive to light.  Neck: Neck supple. No tracheal deviation present. No thyromegaly present.  Cardiovascular: Regular rhythm.   No murmur heard.      tachcardic  Pulmonary/Chest: Effort normal and breath sounds normal.  Abdominal: Soft. Bowel sounds are normal. He exhibits no distension. There is no tenderness.  Musculoskeletal: Normal range of motion. He exhibits no edema and no tenderness.  Neurological: He is alert. Coordination normal.  Skin: Skin is warm and dry. No rash noted.  Psychiatric: He has a normal mood and affect. Thought content normal.    ED Course  Procedures  Results for orders placed in visit on 10/23/10  BASIC METABOLIC PANEL      Component Value Range   Sodium 139  135 - 145 (mEq/L)   Potassium 4.7  3.5 - 5.1 (mEq/L)   Chloride 105  96 - 112 (  mEq/L)   CO2 27  19 - 32 (mEq/L)   Glucose, Bld 72  70 - 99 (mg/dL)   BUN 12  6 - 23 (mg/dL)   Creatinine, Ser 0.9  0.4 - 1.5 (mg/dL)   Calcium 9.4  8.4 - 78.2 (mg/dL)   GFR 95.62  >13.08 (mL/min)  TSH      Component Value Range   TSH 1.57  0.35 - 5.50 (uIU/mL)  MAGNESIUM      Component Value Range   Magnesium 2.0  1.5 - 2.5 (mg/dL)   No results found.   No diagnosis found.  ED ECG REPORT   Date: 05/30/2011  EKG Time: 4:34 PM  Rate: 110  Rhythm: sinus tachycardia,  normal EKG, normal sinus rhythm, unchanged from previous tracings  Axis: normal  Intervals:none  ST&T Change: Nonspecific ST-T wave change  Narrative Interpretation: Previous tracing from 04/04/2006 normal sinus rhythm 70 beats per minute otherwise no significant  change  Received labetalol 20 mg intravenously at 5:45 PM patient asymptomatic.         MDM Case was discussed with Dr.Fusco. Dr. Sherwood Gambler has adjusted his blood pressure medicine earlier today the office, has increased Norvasc dose to 10 mg and added hydrochlorothiazide. I did not feel the patient suffered end organ damage as result of elevated blood pressure. Troponin is negative after several days of symptoms. Patient wishes to go home. Plan he should check his blood pressure tomorrow morning if higher than 140/90 is to call Dr. Carlena Sax for further advice. Return to the emergency department he feels worse tonight. Diagnosis hypertension. #2 hypokalemia       Doug Sou, MD 05/30/11 1750

## 2011-05-30 NOTE — ED Notes (Signed)
C/o tingling in fingers, burning sensation , elevated blood pressure

## 2011-10-02 DIAGNOSIS — L0390 Cellulitis, unspecified: Secondary | ICD-10-CM | POA: Diagnosis not present

## 2011-10-02 DIAGNOSIS — Z6827 Body mass index (BMI) 27.0-27.9, adult: Secondary | ICD-10-CM | POA: Diagnosis not present

## 2011-10-02 DIAGNOSIS — G8929 Other chronic pain: Secondary | ICD-10-CM | POA: Diagnosis not present

## 2011-10-02 DIAGNOSIS — L0291 Cutaneous abscess, unspecified: Secondary | ICD-10-CM | POA: Diagnosis not present

## 2011-10-02 DIAGNOSIS — I10 Essential (primary) hypertension: Secondary | ICD-10-CM | POA: Diagnosis not present

## 2011-12-25 DIAGNOSIS — Z6827 Body mass index (BMI) 27.0-27.9, adult: Secondary | ICD-10-CM | POA: Diagnosis not present

## 2011-12-25 DIAGNOSIS — F411 Generalized anxiety disorder: Secondary | ICD-10-CM | POA: Diagnosis not present

## 2011-12-25 DIAGNOSIS — I10 Essential (primary) hypertension: Secondary | ICD-10-CM | POA: Diagnosis not present

## 2011-12-25 DIAGNOSIS — G8929 Other chronic pain: Secondary | ICD-10-CM | POA: Diagnosis not present

## 2011-12-25 DIAGNOSIS — K083 Retained dental root: Secondary | ICD-10-CM | POA: Diagnosis not present

## 2012-01-19 DIAGNOSIS — D649 Anemia, unspecified: Secondary | ICD-10-CM | POA: Diagnosis not present

## 2012-01-19 DIAGNOSIS — G4733 Obstructive sleep apnea (adult) (pediatric): Secondary | ICD-10-CM | POA: Diagnosis not present

## 2012-01-19 DIAGNOSIS — K219 Gastro-esophageal reflux disease without esophagitis: Secondary | ICD-10-CM | POA: Diagnosis not present

## 2012-01-19 DIAGNOSIS — I4891 Unspecified atrial fibrillation: Secondary | ICD-10-CM | POA: Diagnosis not present

## 2012-01-19 DIAGNOSIS — I10 Essential (primary) hypertension: Secondary | ICD-10-CM | POA: Diagnosis not present

## 2012-01-19 DIAGNOSIS — R5383 Other fatigue: Secondary | ICD-10-CM | POA: Diagnosis not present

## 2012-01-19 DIAGNOSIS — R5381 Other malaise: Secondary | ICD-10-CM | POA: Diagnosis not present

## 2012-01-19 DIAGNOSIS — I2510 Atherosclerotic heart disease of native coronary artery without angina pectoris: Secondary | ICD-10-CM | POA: Diagnosis not present

## 2012-02-05 ENCOUNTER — Ambulatory Visit (HOSPITAL_COMMUNITY)
Admission: RE | Admit: 2012-02-05 | Discharge: 2012-02-05 | Disposition: A | Discharge status: Home / Self Care | Payer: Medicare Other | Source: Ambulatory Visit | Attending: Physician Assistant | Admitting: Physician Assistant

## 2012-02-05 ENCOUNTER — Other Ambulatory Visit (HOSPITAL_COMMUNITY): Payer: Self-pay | Admitting: Physician Assistant

## 2012-02-05 ENCOUNTER — Other Ambulatory Visit (HOSPITAL_COMMUNITY): Payer: Self-pay | Admitting: Internal Medicine

## 2012-02-05 ENCOUNTER — Ambulatory Visit (HOSPITAL_COMMUNITY)
Admission: RE | Admit: 2012-02-05 | Discharge: 2012-02-05 | Disposition: A | Discharge status: Home / Self Care | Payer: Medicare Other | Source: Ambulatory Visit | Attending: Internal Medicine | Admitting: Internal Medicine

## 2012-02-05 DIAGNOSIS — G8929 Other chronic pain: Secondary | ICD-10-CM | POA: Diagnosis not present

## 2012-02-05 DIAGNOSIS — F411 Generalized anxiety disorder: Secondary | ICD-10-CM | POA: Diagnosis not present

## 2012-02-05 DIAGNOSIS — S6390XA Sprain of unspecified part of unspecified wrist and hand, initial encounter: Secondary | ICD-10-CM | POA: Diagnosis not present

## 2012-02-05 DIAGNOSIS — S63259A Unspecified dislocation of unspecified finger, initial encounter: Secondary | ICD-10-CM | POA: Diagnosis not present

## 2012-02-05 DIAGNOSIS — M79609 Pain in unspecified limb: Secondary | ICD-10-CM | POA: Insufficient documentation

## 2012-02-05 DIAGNOSIS — IMO0002 Reserved for concepts with insufficient information to code with codable children: Secondary | ICD-10-CM

## 2012-02-05 DIAGNOSIS — I10 Essential (primary) hypertension: Secondary | ICD-10-CM | POA: Diagnosis not present

## 2012-02-05 DIAGNOSIS — M7989 Other specified soft tissue disorders: Secondary | ICD-10-CM | POA: Diagnosis not present

## 2012-02-05 DIAGNOSIS — S63279A Dislocation of unspecified interphalangeal joint of unspecified finger, initial encounter: Secondary | ICD-10-CM | POA: Diagnosis not present

## 2012-02-05 DIAGNOSIS — Z6827 Body mass index (BMI) 27.0-27.9, adult: Secondary | ICD-10-CM | POA: Diagnosis not present

## 2012-02-05 DIAGNOSIS — X58XXXA Exposure to other specified factors, initial encounter: Secondary | ICD-10-CM | POA: Insufficient documentation

## 2012-03-08 DIAGNOSIS — K219 Gastro-esophageal reflux disease without esophagitis: Secondary | ICD-10-CM | POA: Diagnosis not present

## 2012-03-08 DIAGNOSIS — R002 Palpitations: Secondary | ICD-10-CM | POA: Diagnosis not present

## 2012-03-08 DIAGNOSIS — I10 Essential (primary) hypertension: Secondary | ICD-10-CM | POA: Diagnosis not present

## 2012-03-08 DIAGNOSIS — Z6827 Body mass index (BMI) 27.0-27.9, adult: Secondary | ICD-10-CM | POA: Diagnosis not present

## 2012-03-08 DIAGNOSIS — G8929 Other chronic pain: Secondary | ICD-10-CM | POA: Diagnosis not present

## 2012-04-19 DIAGNOSIS — Z6828 Body mass index (BMI) 28.0-28.9, adult: Secondary | ICD-10-CM | POA: Diagnosis not present

## 2012-04-19 DIAGNOSIS — G8929 Other chronic pain: Secondary | ICD-10-CM | POA: Diagnosis not present

## 2012-04-19 DIAGNOSIS — J209 Acute bronchitis, unspecified: Secondary | ICD-10-CM | POA: Diagnosis not present

## 2012-04-19 DIAGNOSIS — I10 Essential (primary) hypertension: Secondary | ICD-10-CM | POA: Diagnosis not present

## 2012-05-22 DIAGNOSIS — G8929 Other chronic pain: Secondary | ICD-10-CM | POA: Diagnosis not present

## 2012-05-22 DIAGNOSIS — R002 Palpitations: Secondary | ICD-10-CM | POA: Diagnosis not present

## 2012-05-22 DIAGNOSIS — I10 Essential (primary) hypertension: Secondary | ICD-10-CM | POA: Diagnosis not present

## 2012-05-22 DIAGNOSIS — J209 Acute bronchitis, unspecified: Secondary | ICD-10-CM | POA: Diagnosis not present

## 2012-07-02 DIAGNOSIS — G8929 Other chronic pain: Secondary | ICD-10-CM | POA: Diagnosis not present

## 2012-07-02 DIAGNOSIS — Z6828 Body mass index (BMI) 28.0-28.9, adult: Secondary | ICD-10-CM | POA: Diagnosis not present

## 2012-07-02 DIAGNOSIS — I10 Essential (primary) hypertension: Secondary | ICD-10-CM | POA: Diagnosis not present

## 2012-07-29 DIAGNOSIS — J209 Acute bronchitis, unspecified: Secondary | ICD-10-CM | POA: Diagnosis not present

## 2012-07-29 DIAGNOSIS — G8929 Other chronic pain: Secondary | ICD-10-CM | POA: Diagnosis not present

## 2012-07-29 DIAGNOSIS — Z6829 Body mass index (BMI) 29.0-29.9, adult: Secondary | ICD-10-CM | POA: Diagnosis not present

## 2012-08-11 ENCOUNTER — Ambulatory Visit (INDEPENDENT_AMBULATORY_CARE_PROVIDER_SITE_OTHER): Payer: Medicare Other | Admitting: Cardiology

## 2012-08-11 ENCOUNTER — Encounter: Payer: Self-pay | Admitting: Cardiology

## 2012-08-11 VITALS — BP 141/98 | HR 72 | Ht 72.0 in | Wt 224.0 lb

## 2012-08-11 DIAGNOSIS — R002 Palpitations: Secondary | ICD-10-CM

## 2012-08-11 DIAGNOSIS — I2510 Atherosclerotic heart disease of native coronary artery without angina pectoris: Secondary | ICD-10-CM

## 2012-08-11 DIAGNOSIS — I10 Essential (primary) hypertension: Secondary | ICD-10-CM

## 2012-08-11 DIAGNOSIS — F17200 Nicotine dependence, unspecified, uncomplicated: Secondary | ICD-10-CM | POA: Insufficient documentation

## 2012-08-11 DIAGNOSIS — Z79899 Other long term (current) drug therapy: Secondary | ICD-10-CM

## 2012-08-11 DIAGNOSIS — R000 Tachycardia, unspecified: Secondary | ICD-10-CM

## 2012-08-11 DIAGNOSIS — IMO0001 Reserved for inherently not codable concepts without codable children: Secondary | ICD-10-CM | POA: Insufficient documentation

## 2012-08-11 MED ORDER — LISINOPRIL 20 MG PO TABS: 20.0000 mg | ORAL_TABLET | Freq: Every day | ORAL | Status: DC

## 2012-08-11 MED ORDER — DILTIAZEM HCL ER COATED BEADS 240 MG PO CP24: 240.0000 mg | ORAL_CAPSULE | Freq: Every day | ORAL | Status: DC

## 2012-08-11 NOTE — Patient Instructions (Addendum)
   Stop Amlodipine  Begin Diltiazem CD 240mg  daily  Increase Lisinopril to 20mg  daily Continue all other current medications.  Labs in 10 days (around December 9) for BMET, Lipid, & Magnesium   Echo  Office will contact with results  Follow up in  1 month

## 2012-08-11 NOTE — Progress Notes (Signed)
Patient ID: Timothy [name], male   DOB: Mar 30, 1971, 41 y.o.   MRN: [id number] PCP: Dr. Sherwood Gambler  41 yo with long history of arrhythmias and palpitations presents for cardiology followup.  He was seen by Dr. Graciela Husbands back in the 1990s.  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  He ended up having 3 ablations at [facility] (1995, 1998, and 2001).  Since then, he has had problems with palpitations.  He had a loop recorder through the [facility] for 2 years around 2004.  Apparently no significant arrhythmia was found.  Last event monitor was in 2/12 and showed PVCs.  He will feel an extra beat followed a pause and then a strong beat.  The strong beat will given him diaphoresis, nausea, and chest pain.  He also sometimes gets a pounding sensation in his chest.  The palpitations can last for a few seconds or up to 10 minutes. No syncope.  He has been having these palpitations for years, and they have been somewhat debilitating.  Symptoms occur on most days.  He has a lot of anxiety surrounding them.   He drinks a fair amount of soft drinks and iced tea.   He does not get exertional dyspnea or chest pain.  The only time he gets chest pain is when he has palpitations.  His BP has been running high (150s/100s at times at home, 141/98 today).  No recent labs this year in our system.  He continues to smoke about 1 ppd.   ECG: NSR, normal  Labs (9/12): K 3.4, creatinine 0.72  PMH: 1.  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  He had 3 ablations at [facility], in 1995, 1998, and 2001.  Loop recorder around 2004 from the [facility], apparently no significant arrhythmia was found.  Last event monitor in 2/12 showed only PVCs.  Frequent palpitations still.  2. CAD: Nonobstructive.  LHC (2007) showed 50% stenosis in a small diagonal, luminal irregularities in other vessels.  EF was 50-55% on LV-gram.  3. HTN 4. H/o syncope 5.  Anxiety 6. Home sleep study in 2/12 showed AHI 10/hr with predominant central sleep apnea.   SH: Divorced, smokes about 1/2 ppd, lives in [place], unemployed.    FH: Father with CAD s/p CABG.   ROS: All systems reviewed and negative except as per HPI.   Current Outpatient Prescriptions  Medication Sig Dispense Refill  . ALPRAZolam (XANAX) 1 MG tablet Take 1 mg by mouth 3 (three) times daily as needed. Takes 3 to 4 times daily as needed for anxiety       . aspirin 81 MG tablet Take 81 mg by mouth daily.        . hydrochlorothiazide (HYDRODIURIL) 25 MG tablet Take 25 mg by mouth daily.        Marland Kitchen lisinopril (PRINIVIL,ZESTRIL) 20 MG tablet Take 1 tablet (20 mg total) by mouth daily.  30 tablet  6  . metoprolol (TOPROL-XL) 100 MG 24 hr tablet Take 100 mg by mouth 2 (two) times daily.        . Naphazoline HCl (CLEAR EYES OP) Apply 1 drop to eye daily as needed. For red eyes       . oxycodone (OXY-IR) 5 MG capsule Take 5 mg by mouth every 6 (six) hours as needed. For pain. May take 3 to 4 times daily.       Marland Kitchen  potassium chloride (K-DUR,KLOR-CON) 10 MEQ tablet Take 10 mEq by mouth 2 (two) times daily.       . [DISCONTINUED] lisinopril (PRINIVIL,ZESTRIL) 5 MG tablet Take 5 mg by mouth daily.        . [DISCONTINUED] metoprolol (TOPROL XL) 100 MG 24 hr tablet Take 100 mg by mouth daily.        Marland Kitchen diltiazem (CARDIZEM CD) 240 MG 24 hr capsule Take 1 capsule (240 mg total) by mouth daily.  30 capsule  6  . [DISCONTINUED] potassium chloride SA (K-DUR,KLOR-CON) 20 MEQ tablet Take 20 mEq by mouth daily.          BP 141/98  Pulse 72  Ht 6' (1.829 m)  Wt 224 lb (101.606 kg)  BMI 30.38 kg/m2 General: NAD Neck: No JVD, no thyromegaly or thyroid nodule.  Lungs: Clear to auscultation bilaterally with normal respiratory effort. CV: Nondisplaced PMI.  Heart regular S1/S2, no S3/S4, no murmur.  No peripheral edema.  No carotid bruit.  Normal pedal pulses.  Abdomen: Soft, nontender, no hepatosplenomegaly, no  distention.  Skin: Intact without lesions or rashes.  Neurologic: Alert and oriented x 3.  Psych: Normal affect. Extremities: No clubbing or cyanosis.  HEENT: Normal.   Assessment/Plan: 1. Palpitations: These are quite bothersome and have been present for many years.  He has a lot of anxiety around them.  No syncope/presyncope, but he will get diaphoretic with nausea and chest pain when he has them.  By description, they seem consistent with PVCs.  Most recent monitoring with the same symptoms present in 2/12 showed PVCs.   - I suggested that he wear a 48 hour holter to document PVCs and assess total burden, but he is resistant to wearing a monitor again (has had multiple through the years).   - Continue Toprol XL 100 mg bid.  I will have him stop amlodipine and instead take diltiazem CD 240 mg daily to see if this helps suppress the symptoms.  We also discussed cutting back on caffeine.  - If the above change does not help, he would be open to getting the holter monitor.  If he has a significant PVC load, would consider PVC ablation given his degree of symptoms (rather than committing him to a long-term antiarrhythmic medication).  - Echocardiogram to assess LV systolic function.  - Check BMET and Mg to make sure K and Mg are not significantly low.  2. HTN: BP runs high.  As above, I am stopping amlodipine and starting diltiazem, which may worsen BP control.  I will increase lisinopril to 20 mg daily.   3. CAD: Patient has a history of nonobstructive CAD.  He continues to smoke.   - Continue ASA 81 daily.  - He would likely benefit from a statin.  Will check lipids to decide on which statin and what dose.  4. Smoking:  I strongly encouraged him to quit today.  5. Followup in 1 month.   Marca Ancona 08/11/2012 11:39 AM

## 2012-08-26 ENCOUNTER — Other Ambulatory Visit: Payer: Medicare Other

## 2012-08-26 DIAGNOSIS — R0989 Other specified symptoms and signs involving the circulatory and respiratory systems: Secondary | ICD-10-CM

## 2012-08-28 DIAGNOSIS — G8929 Other chronic pain: Secondary | ICD-10-CM | POA: Diagnosis not present

## 2012-08-28 DIAGNOSIS — Z6829 Body mass index (BMI) 29.0-29.9, adult: Secondary | ICD-10-CM | POA: Diagnosis not present

## 2012-08-28 DIAGNOSIS — I10 Essential (primary) hypertension: Secondary | ICD-10-CM | POA: Diagnosis not present

## 2012-08-28 DIAGNOSIS — K219 Gastro-esophageal reflux disease without esophagitis: Secondary | ICD-10-CM | POA: Diagnosis not present

## 2012-08-28 DIAGNOSIS — Z23 Encounter for immunization: Secondary | ICD-10-CM | POA: Diagnosis not present

## 2012-09-17 ENCOUNTER — Other Ambulatory Visit: Payer: Self-pay | Admitting: *Deleted

## 2012-09-17 DIAGNOSIS — R000 Tachycardia, unspecified: Secondary | ICD-10-CM

## 2012-09-17 DIAGNOSIS — Z79899 Other long term (current) drug therapy: Secondary | ICD-10-CM

## 2012-09-17 DIAGNOSIS — I10 Essential (primary) hypertension: Secondary | ICD-10-CM

## 2012-09-29 ENCOUNTER — Ambulatory Visit: Payer: Medicare Other | Admitting: Cardiology

## 2012-09-30 DIAGNOSIS — G8929 Other chronic pain: Secondary | ICD-10-CM | POA: Diagnosis not present

## 2012-09-30 DIAGNOSIS — I10 Essential (primary) hypertension: Secondary | ICD-10-CM | POA: Diagnosis not present

## 2012-09-30 DIAGNOSIS — Z6829 Body mass index (BMI) 29.0-29.9, adult: Secondary | ICD-10-CM | POA: Diagnosis not present

## 2012-10-29 DIAGNOSIS — Z6828 Body mass index (BMI) 28.0-28.9, adult: Secondary | ICD-10-CM | POA: Diagnosis not present

## 2012-10-29 DIAGNOSIS — G8929 Other chronic pain: Secondary | ICD-10-CM | POA: Diagnosis not present

## 2012-10-29 DIAGNOSIS — I10 Essential (primary) hypertension: Secondary | ICD-10-CM | POA: Diagnosis not present

## 2012-11-25 DIAGNOSIS — G8929 Other chronic pain: Secondary | ICD-10-CM | POA: Diagnosis not present

## 2012-12-28 DIAGNOSIS — Z125 Encounter for screening for malignant neoplasm of prostate: Secondary | ICD-10-CM | POA: Diagnosis not present

## 2012-12-28 DIAGNOSIS — I2510 Atherosclerotic heart disease of native coronary artery without angina pectoris: Secondary | ICD-10-CM | POA: Diagnosis not present

## 2012-12-28 DIAGNOSIS — G8929 Other chronic pain: Secondary | ICD-10-CM | POA: Diagnosis not present

## 2012-12-28 DIAGNOSIS — N400 Enlarged prostate without lower urinary tract symptoms: Secondary | ICD-10-CM | POA: Diagnosis not present

## 2012-12-28 DIAGNOSIS — Z6830 Body mass index (BMI) 30.0-30.9, adult: Secondary | ICD-10-CM | POA: Diagnosis not present

## 2012-12-28 DIAGNOSIS — I10 Essential (primary) hypertension: Secondary | ICD-10-CM | POA: Diagnosis not present

## 2013-01-19 ENCOUNTER — Encounter: Payer: Medicare Other | Admitting: Physician Assistant

## 2013-01-19 NOTE — Progress Notes (Signed)
Primary Cardiologist: Marca Ancona, MD   HPI: Scheduled followup.   -   Allergies  Allergen Reactions  . Morphine Other (See Comments)    Makes overly sleepy    Current Outpatient Prescriptions  Medication Sig Dispense Refill  . ALPRAZolam (XANAX) 1 MG tablet Take 1 mg by mouth 3 (three) times daily as needed. Takes 3 to 4 times daily as needed for anxiety       . aspirin 81 MG tablet Take 81 mg by mouth daily.        Marland Kitchen diltiazem (CARDIZEM CD) 240 MG 24 hr capsule Take 1 capsule (240 mg total) by mouth daily.  30 capsule  6  . hydrochlorothiazide (HYDRODIURIL) 25 MG tablet Take 25 mg by mouth daily.        Marland Kitchen lisinopril (PRINIVIL,ZESTRIL) 20 MG tablet Take 1 tablet (20 mg total) by mouth daily.  30 tablet  6  . metoprolol (TOPROL-XL) 100 MG 24 hr tablet Take 100 mg by mouth 2 (two) times daily.        . Naphazoline HCl (CLEAR EYES OP) Apply 1 drop to eye daily as needed. For red eyes       . oxycodone (OXY-IR) 5 MG capsule Take 5 mg by mouth every 6 (six) hours as needed. For pain. May take 3 to 4 times daily.       . potassium chloride (K-DUR,KLOR-CON) 10 MEQ tablet Take 10 mEq by mouth 2 (two) times daily.        No current facility-administered medications for this visit.    Past Medical History  Diagnosis Date  . Tachycardia   . Palpitations   . Syncope   . Anxiety   . HTN (hypertension)     Past Surgical History  Procedure Laterality Date  . Atrial ablation surgery      History   Social History  . Marital Status: Married    Spouse Name: N/A    Number of Children: N/A  . Years of Education: N/A   Occupational History  . Not on file.   Social History Main Topics  . Smoking status: Current Every Day Smoker  . Smokeless tobacco: Not on file  . Alcohol Use: Not on file  . Drug Use: Not on file  . Sexually Active: Not on file   Other Topics Concern  . Not on file   Social History Narrative  . No narrative on file    Family History  Problem Relation  Age of Onset  . Coronary artery disease Father     cabg    ROS: no nausea, vomiting; no fever, chills; no melena, hematochezia; no claudication  PHYSICAL EXAM: There were no vitals taken for this visit. GENERAL: 42 year-old male; NAD HEENT: NCAT, PERRLA, EOMI; sclera clear; no xanthelasma NECK: palpable bilateral carotid pulses, no bruits; no JVD; no TM LUNGS: CTA bilaterally CARDIAC: RRR (S1, S2); no significant murmurs; no rubs or gallops ABDOMEN: soft, non-tender; intact BS EXTREMETIES: intact distal pulses; no significant peripheral edema SKIN: warm/dry; no obvious rash/lesions MUSCULOSKELETAL: no joint deformity NEURO: no focal deficit; NL affect   EKG: reviewed and available in Electronic Records   ASSESSMENT & PLAN:  No problem-specific assessment & plan notes found for this encounter.   Gene Maxcine Strong, PAC

## 2013-01-27 ENCOUNTER — Other Ambulatory Visit (HOSPITAL_COMMUNITY): Payer: Self-pay | Admitting: Physician Assistant

## 2013-01-27 DIAGNOSIS — Z6829 Body mass index (BMI) 29.0-29.9, adult: Secondary | ICD-10-CM | POA: Diagnosis not present

## 2013-01-27 DIAGNOSIS — R109 Unspecified abdominal pain: Secondary | ICD-10-CM | POA: Diagnosis not present

## 2013-01-27 DIAGNOSIS — G8929 Other chronic pain: Secondary | ICD-10-CM | POA: Diagnosis not present

## 2013-02-01 ENCOUNTER — Ambulatory Visit (HOSPITAL_COMMUNITY)
Admission: RE | Admit: 2013-02-01 | Discharge: 2013-02-01 | Disposition: A | Discharge status: Home / Self Care | Payer: Medicare Other | Source: Ambulatory Visit | Attending: Physician Assistant | Admitting: Physician Assistant

## 2013-02-01 DIAGNOSIS — R112 Nausea with vomiting, unspecified: Secondary | ICD-10-CM | POA: Insufficient documentation

## 2013-02-01 DIAGNOSIS — I10 Essential (primary) hypertension: Secondary | ICD-10-CM | POA: Diagnosis not present

## 2013-02-01 DIAGNOSIS — R1011 Right upper quadrant pain: Secondary | ICD-10-CM | POA: Diagnosis not present

## 2013-02-01 DIAGNOSIS — R109 Unspecified abdominal pain: Secondary | ICD-10-CM

## 2013-02-08 ENCOUNTER — Encounter (INDEPENDENT_AMBULATORY_CARE_PROVIDER_SITE_OTHER): Payer: Self-pay | Admitting: *Deleted

## 2013-03-01 DIAGNOSIS — M545 Low back pain: Secondary | ICD-10-CM | POA: Diagnosis not present

## 2013-03-01 DIAGNOSIS — G8929 Other chronic pain: Secondary | ICD-10-CM | POA: Diagnosis not present

## 2013-03-01 DIAGNOSIS — Z6830 Body mass index (BMI) 30.0-30.9, adult: Secondary | ICD-10-CM | POA: Diagnosis not present

## 2013-03-02 ENCOUNTER — Other Ambulatory Visit (HOSPITAL_COMMUNITY): Payer: Self-pay | Admitting: Physician Assistant

## 2013-03-02 DIAGNOSIS — M545 Low back pain: Secondary | ICD-10-CM

## 2013-03-02 DIAGNOSIS — R197 Diarrhea, unspecified: Secondary | ICD-10-CM

## 2013-03-02 DIAGNOSIS — D582 Other hemoglobinopathies: Secondary | ICD-10-CM

## 2013-03-02 DIAGNOSIS — G8929 Other chronic pain: Secondary | ICD-10-CM

## 2013-03-03 ENCOUNTER — Ambulatory Visit (INDEPENDENT_AMBULATORY_CARE_PROVIDER_SITE_OTHER): Payer: Medicare Other | Admitting: Cardiology

## 2013-03-03 ENCOUNTER — Encounter: Payer: Self-pay | Admitting: Cardiology

## 2013-03-03 ENCOUNTER — Other Ambulatory Visit (HOSPITAL_COMMUNITY): Payer: Self-pay | Admitting: Physician Assistant

## 2013-03-03 ENCOUNTER — Ambulatory Visit (INDEPENDENT_AMBULATORY_CARE_PROVIDER_SITE_OTHER): Payer: Medicare Other | Admitting: Internal Medicine

## 2013-03-03 ENCOUNTER — Inpatient Hospital Stay
Admission: RE | Admit: 2013-03-03 | Discharge: 2013-03-03 | Disposition: A | Discharge status: Home / Self Care | Payer: Self-pay | Source: Ambulatory Visit | Attending: Cardiology | Admitting: Cardiology

## 2013-03-03 VITALS — BP 134/98 | HR 113 | Ht 72.0 in | Wt 218.0 lb

## 2013-03-03 DIAGNOSIS — F17200 Nicotine dependence, unspecified, uncomplicated: Secondary | ICD-10-CM

## 2013-03-03 DIAGNOSIS — I471 Supraventricular tachycardia: Secondary | ICD-10-CM

## 2013-03-03 DIAGNOSIS — I4949 Other premature depolarization: Secondary | ICD-10-CM | POA: Diagnosis not present

## 2013-03-03 DIAGNOSIS — R002 Palpitations: Secondary | ICD-10-CM

## 2013-03-03 DIAGNOSIS — I10 Essential (primary) hypertension: Secondary | ICD-10-CM

## 2013-03-03 DIAGNOSIS — I493 Ventricular premature depolarization: Secondary | ICD-10-CM

## 2013-03-03 DIAGNOSIS — I498 Other specified cardiac arrhythmias: Secondary | ICD-10-CM

## 2013-03-03 DIAGNOSIS — R109 Unspecified abdominal pain: Secondary | ICD-10-CM

## 2013-03-03 DIAGNOSIS — Z8679 Personal history of other diseases of the circulatory system: Secondary | ICD-10-CM | POA: Diagnosis not present

## 2013-03-03 MED ORDER — CLONIDINE HCL 0.2 MG PO TABS: 0.2000 mg | ORAL_TABLET | Freq: Two times a day (BID) | ORAL | Status: DC

## 2013-03-03 NOTE — Patient Instructions (Signed)
   Increase Clonidine to 0.2mg  twice a day - new sent to pharm Continue all other current medications. Renal arterial doppler  Echo  Office will contact with results via phone or letter.   Referral to Dr. Graciela Husbands Your physician wants you to follow up in: 6 months.  You will receive a reminder letter in the mail one-two months in advance.  If you don't receive a letter, please call our office to schedule the follow up appointment

## 2013-03-06 NOTE — Progress Notes (Signed)
Patient ID: Ned Card., male   DOB: 09-12-1971, 42 y.o.   MRN: [id number] PCP: Dr. Sherwood Gambler  42 yo with long history of arrhythmias and palpitations presents for cardiology followup.  He was seen by Dr. Graciela Husbands back in the 1990s.  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  He ended up having 3 ablations at [facility] (1995, 1998, and 2001).  Since then, he has had problems with palpitations.  He had a loop recorder through the [facility] for 2 years around 2004.  Apparently no significant arrhythmia was found.  Last event monitor was in 2/12 and showed PVCs.  He will feel an extra beat followed a pause and then a strong beat.  The strong beat will given him diaphoresis, nausea, and chest pain.  He also sometimes gets a pounding sensation in his chest.  The palpitations can last for a few seconds or up to 10 minutes. No syncope.  He has been having these palpitations for years, and they have been somewhat debilitating.  He has daily long runs of tachypalpitations (up to a minute on average now).  He has a lot of anxiety surrounding them.   He does not get exertional dyspnea or chest pain.  The only time he gets chest pain is when he has palpitations.  His BP continues to run high.  He takes clonidine only the morning, and evening blood pressure seems to run too high.  He stopped lisinopril because he thought it made his palpitations worse.  He continues to smoke about 1 ppd.   Labs (9/12): K 3.4, creatinine 0.72  PMH: 1.  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  He had 3 ablations at [facility], in 1995, 1998, and 2001.  Loop recorder around 2004 from the [facility], apparently no significant arrhythmia was found.  Last event monitor in 2/12 showed only PVCs.  Frequent palpitations and runs still.  2. CAD: Nonobstructive.  LHC (2007) showed 50% stenosis in a small diagonal, luminal irregularities in other  vessels.  EF was 50-55% on LV-gram.  3. HTN 4. H/o syncope 5. Anxiety 6. Home sleep study in 2/12 showed AHI 10/hr with predominant central sleep apnea.   SH: Divorced, smokes about 1 ppd, lives in [place], unemployed.    FH: Father with CAD s/p CABG.   ROS: All systems reviewed and negative except as per HPI.   Current Outpatient Prescriptions  Medication Sig Dispense Refill  . ALPRAZolam (XANAX) 1 MG tablet Take 1 mg by mouth 3 (three) times daily as needed. Takes 3 to 4 times daily as needed for anxiety       . aspirin 81 MG tablet Take 81 mg by mouth daily.        Marland Kitchen CARTIA XT 120 MG 24 hr capsule Take 1 capsule by mouth every evening.      . cloNIDine (CATAPRES) 0.2 MG tablet Take 1 tablet (0.2 mg total) by mouth 2 (two) times daily.  60 tablet  6  . CYMBALTA 20 MG capsule as directed.      . diltiazem (CARDIZEM CD) 240 MG 24 hr capsule Take 1 capsule (240 mg total) by mouth daily.  30 capsule  6  . hydrochlorothiazide (HYDRODIURIL) 25 MG tablet Take 25 mg by mouth daily.        . metoprolol (TOPROL-XL) 100 MG 24 hr tablet Take 100 mg by mouth 2 (  two) times daily.        . mirtazapine (REMERON) 15 MG tablet as needed.      . Naphazoline HCl (CLEAR EYES OP) Apply 1 drop to eye daily as needed. For red eyes       . oxycodone (OXY-IR) 5 MG capsule Take 5 mg by mouth every 6 (six) hours as needed. For pain. May take 3 to 4 times daily.       . potassium chloride (K-DUR,KLOR-CON) 10 MEQ tablet Take 10 mEq by mouth 2 (two) times daily.        No current facility-administered medications for this visit.    BP 134/98  Pulse 113  Ht 6' (1.829 m)  Wt 218 lb (98.884 kg)  BMI 29.56 kg/m2  SpO2 96% General: NAD Neck: No JVD, no thyromegaly or thyroid nodule.  Lungs: Clear to auscultation bilaterally with normal respiratory effort. CV: Nondisplaced PMI.  Heart regular S1/S2, no S3/S4, no murmur.  No peripheral edema.  No carotid bruit.  Normal pedal pulses.  Abdomen: Soft, nontender,  no hepatosplenomegaly, no distention.  Skin: Intact without lesions or rashes.  Neurologic: Alert and oriented x 3.  Psych: Normal affect. Extremities: No clubbing or cyanosis.  HEENT: Normal.   Assessment/Plan: 1. Palpitations: These are quite bothersome and have been present for many years.  He has a lot of anxiety around them.  No syncope/presyncope, but he will get diaphoretic with nausea and chest pain when he has them.  Most recent monitoring in 2/12 showed PVCs.  He additionally has daily long runs of tachypalpitations more suggestive of SVT.  - He is reticent to wear a holter or 30 day monitor.  He says that "they never show anything."  Given his ongoing symptoms, I think that a Reveal monitor may be helpful here.  Will see if I can arrange this with Dr. Graciela Husbands, who he has seen in the past.    - Continue Toprol XL 100 mg bid and diltiazem CD.  We also discussed cutting back on caffeine.  - Echocardiogram to assess LV systolic function.  - Will get copy of most recent BMET from PCP.   2. HTN: BP runs high. He thinks lisinopril makes his palpitations worse.  - Increase clonidine to bid rather than qd.   - Check renal artery doppler US.   3. CAD: Patient has a history of nonobstructive CAD.  He continues to smoke.   - Continue ASA 81 daily.  - He would likely benefit from a statin.  Will get copy of most recent lipids from PCP to decide on what statin at which dose.  4. Smoking:  I strongly encouraged him to quit today.   Marca Ancona 03/06/2013

## 2013-03-07 ENCOUNTER — Ambulatory Visit (HOSPITAL_COMMUNITY)
Admission: RE | Admit: 2013-03-07 | Discharge: 2013-03-07 | Disposition: A | Discharge status: Home / Self Care | Payer: Medicare Other | Source: Ambulatory Visit | Attending: Physician Assistant | Admitting: Physician Assistant

## 2013-03-07 ENCOUNTER — Telehealth: Payer: Self-pay | Admitting: Cardiology

## 2013-03-07 DIAGNOSIS — M549 Dorsalgia, unspecified: Secondary | ICD-10-CM | POA: Insufficient documentation

## 2013-03-07 DIAGNOSIS — R197 Diarrhea, unspecified: Secondary | ICD-10-CM | POA: Diagnosis not present

## 2013-03-07 DIAGNOSIS — R109 Unspecified abdominal pain: Secondary | ICD-10-CM | POA: Insufficient documentation

## 2013-03-07 MED ORDER — IOHEXOL 300 MG/ML  SOLN
100.0000 mL | Freq: Once | INTRAMUSCULAR | Status: AC | PRN
  Administered 2013-03-07: 100 mL via INTRAVENOUS

## 2013-03-07 NOTE — Telephone Encounter (Signed)
Patient states that his heart is really "acting up" for the past three days. States that he wants Dr. Shirlee Latch to order him a monitor so that it will show up on a monitor. Please call patient on his Cell # [phone].

## 2013-03-08 ENCOUNTER — Other Ambulatory Visit: Payer: Self-pay | Admitting: *Deleted

## 2013-03-08 ENCOUNTER — Encounter (HOSPITAL_COMMUNITY): Payer: Self-pay

## 2013-03-08 ENCOUNTER — Observation Stay (HOSPITAL_COMMUNITY)
Admission: EM | Admit: 2013-03-08 | Discharge: 2013-03-09 | Disposition: A | Discharge status: Home / Self Care | Payer: Medicare Other | Attending: Family Medicine | Admitting: Family Medicine

## 2013-03-08 ENCOUNTER — Emergency Department (HOSPITAL_COMMUNITY): Payer: Medicare Other

## 2013-03-08 DIAGNOSIS — IMO0002 Reserved for concepts with insufficient information to code with codable children: Secondary | ICD-10-CM | POA: Diagnosis not present

## 2013-03-08 DIAGNOSIS — E876 Hypokalemia: Secondary | ICD-10-CM | POA: Diagnosis not present

## 2013-03-08 DIAGNOSIS — R61 Generalized hyperhidrosis: Secondary | ICD-10-CM

## 2013-03-08 DIAGNOSIS — I10 Essential (primary) hypertension: Secondary | ICD-10-CM | POA: Diagnosis present

## 2013-03-08 DIAGNOSIS — D751 Secondary polycythemia: Secondary | ICD-10-CM | POA: Diagnosis present

## 2013-03-08 DIAGNOSIS — Z79899 Other long term (current) drug therapy: Secondary | ICD-10-CM | POA: Diagnosis not present

## 2013-03-08 DIAGNOSIS — R000 Tachycardia, unspecified: Secondary | ICD-10-CM

## 2013-03-08 DIAGNOSIS — S41119A Laceration without foreign body of unspecified upper arm, initial encounter: Secondary | ICD-10-CM

## 2013-03-08 DIAGNOSIS — R0789 Other chest pain: Secondary | ICD-10-CM

## 2013-03-08 DIAGNOSIS — D45 Polycythemia vera: Secondary | ICD-10-CM | POA: Insufficient documentation

## 2013-03-08 DIAGNOSIS — I2510 Atherosclerotic heart disease of native coronary artery without angina pectoris: Secondary | ICD-10-CM

## 2013-03-08 DIAGNOSIS — F17200 Nicotine dependence, unspecified, uncomplicated: Secondary | ICD-10-CM | POA: Diagnosis not present

## 2013-03-08 DIAGNOSIS — R55 Syncope and collapse: Secondary | ICD-10-CM

## 2013-03-08 DIAGNOSIS — Z720 Tobacco use: Secondary | ICD-10-CM

## 2013-03-08 DIAGNOSIS — F411 Generalized anxiety disorder: Secondary | ICD-10-CM

## 2013-03-08 DIAGNOSIS — S51809A Unspecified open wound of unspecified forearm, initial encounter: Secondary | ICD-10-CM | POA: Insufficient documentation

## 2013-03-08 DIAGNOSIS — D72829 Elevated white blood cell count, unspecified: Secondary | ICD-10-CM | POA: Diagnosis not present

## 2013-03-08 DIAGNOSIS — R002 Palpitations: Principal | ICD-10-CM

## 2013-03-08 DIAGNOSIS — Z8679 Personal history of other diseases of the circulatory system: Secondary | ICD-10-CM

## 2013-03-08 DIAGNOSIS — W268XXA Contact with other sharp object(s), not elsewhere classified, initial encounter: Secondary | ICD-10-CM | POA: Insufficient documentation

## 2013-03-08 DIAGNOSIS — IMO0001 Reserved for inherently not codable concepts without codable children: Secondary | ICD-10-CM

## 2013-03-08 DIAGNOSIS — F419 Anxiety disorder, unspecified: Secondary | ICD-10-CM

## 2013-03-08 HISTORY — DX: Atherosclerotic heart disease of native coronary artery without angina pectoris: I25.10

## 2013-03-08 HISTORY — DX: Tobacco use: Z72.0

## 2013-03-08 HISTORY — DX: Pre-excitation syndrome: I45.6

## 2013-03-08 HISTORY — DX: Primary central sleep apnea: G47.31

## 2013-03-08 LAB — BASIC METABOLIC PANEL
BUN: 13 mg/dL (ref 6–23)
Creatinine, Ser: 1.06 mg/dL (ref 0.50–1.35)
GFR calc Af Amer: >90 mL/min (ref >90)
GFR calc non Af Amer: 84 mL/min — ABNORMAL LOW (ref >90)

## 2013-03-08 LAB — CBC WITH DIFFERENTIAL/PLATELET
Basophils Absolute: 0.1 10*3/uL (ref 0.0–0.1)
Eosinophils Absolute: 0.1 10*3/uL (ref 0.0–0.7)
HCT: 55.3 % — ABNORMAL HIGH (ref 39.0–52.0)
Lymphocytes Relative: 25 % (ref 12–46)
MCHC: 36.9 g/dL — ABNORMAL HIGH (ref 30.0–36.0)
Neutro Abs: 8.1 10*3/uL — ABNORMAL HIGH (ref 1.7–7.7)
Neutrophils Relative %: 62 % (ref 43–77)
RDW: 12.8 % (ref 11.5–15.5)

## 2013-03-08 LAB — PROTIME-INR
INR: 1.07 (ref 0.00–1.49)
Prothrombin Time: 13.8 s (ref 11.6–15.2)

## 2013-03-08 LAB — APTT: aPTT: 28 s (ref 24–37)

## 2013-03-08 LAB — TROPONIN I: Troponin I: <0.3 ng/mL (ref <0.30)

## 2013-03-08 MED ORDER — DILTIAZEM HCL ER COATED BEADS 240 MG PO CP24
240.0000 mg | ORAL_CAPSULE | Freq: Every morning | ORAL | Status: DC
  Administered 2013-03-09: 240 mg via ORAL
  Filled 2013-03-08 (×3): qty 1

## 2013-03-08 MED ORDER — CLONIDINE HCL 0.2 MG PO TABS
ORAL_TABLET | ORAL | Status: AC
  Administered 2013-03-08: 0.2 mg via ORAL
  Filled 2013-03-08: qty 1

## 2013-03-08 MED ORDER — MIRTAZAPINE 30 MG PO TABS
ORAL_TABLET | ORAL | Status: AC
  Filled 2013-03-08: qty 1

## 2013-03-08 MED ORDER — POTASSIUM CHLORIDE IN NACL 40-0.9 MEQ/L-% IV SOLN
INTRAVENOUS | Status: DC
  Administered 2013-03-08 – 2013-03-09 (×2): via INTRAVENOUS
  Filled 2013-03-08 (×4): qty 1000

## 2013-03-08 MED ORDER — NICOTINE 21 MG/24HR TD PT24
21.0000 mg | MEDICATED_PATCH | Freq: Every day | TRANSDERMAL | Status: DC
  Administered 2013-03-08 – 2013-03-09 (×2): 21 mg via TRANSDERMAL
  Filled 2013-03-08 (×2): qty 1

## 2013-03-08 MED ORDER — GUAIFENESIN-DM 100-10 MG/5ML PO SYRP: 5.0000 mL | ORAL_SOLUTION | ORAL | Status: DC | PRN

## 2013-03-08 MED ORDER — DILTIAZEM HCL ER COATED BEADS 120 MG PO CP24
120.0000 mg | ORAL_CAPSULE | Freq: Every day | ORAL | Status: DC
  Administered 2013-03-08: 120 mg via ORAL
  Filled 2013-03-08 (×3): qty 1

## 2013-03-08 MED ORDER — POTASSIUM CHLORIDE CRYS ER 20 MEQ PO TBCR
40.0000 meq | EXTENDED_RELEASE_TABLET | Freq: Every day | ORAL | Status: DC
  Filled 2013-03-08: qty 2

## 2013-03-08 MED ORDER — BACITRACIN ZINC 500 UNIT/GM EX OINT
TOPICAL_OINTMENT | CUTANEOUS | Status: AC
  Filled 2013-03-08: qty 0.9

## 2013-03-08 MED ORDER — ALBUTEROL SULFATE (5 MG/ML) 0.5% IN NEBU: 2.5000 mg | INHALATION_SOLUTION | RESPIRATORY_TRACT | Status: DC | PRN

## 2013-03-08 MED ORDER — BACITRACIN 500 UNIT/GM EX OINT: 1.0000 "application " | TOPICAL_OINTMENT | Freq: Two times a day (BID) | CUTANEOUS | Status: DC

## 2013-03-08 MED ORDER — DOUBLE ANTIBIOTIC 500-10000 UNIT/GM EX OINT
TOPICAL_OINTMENT | Freq: Once | CUTANEOUS | Status: DC
  Filled 2013-03-08: qty 1

## 2013-03-08 MED ORDER — ACETAMINOPHEN 650 MG RE SUPP: 650.0000 mg | Freq: Four times a day (QID) | RECTAL | Status: DC | PRN

## 2013-03-08 MED ORDER — METOPROLOL SUCCINATE ER 50 MG PO TB24
100.0000 mg | ORAL_TABLET | Freq: Two times a day (BID) | ORAL | Status: DC
  Administered 2013-03-08 – 2013-03-09 (×2): 100 mg via ORAL
  Filled 2013-03-08: qty 1
  Filled 2013-03-08 (×2): qty 2
  Filled 2013-03-08 (×3): qty 1

## 2013-03-08 MED ORDER — POTASSIUM CHLORIDE 10 MEQ/100ML IV SOLN
10.0000 meq | Freq: Once | INTRAVENOUS | Status: AC
  Administered 2013-03-08: 10 meq via INTRAVENOUS
  Filled 2013-03-08: qty 100

## 2013-03-08 MED ORDER — ASPIRIN 81 MG PO CHEW
81.0000 mg | CHEWABLE_TABLET | Freq: Every day | ORAL | Status: DC
  Administered 2013-03-09: 81 mg via ORAL
  Filled 2013-03-08: qty 1

## 2013-03-08 MED ORDER — HYDROMORPHONE HCL PF 1 MG/ML IJ SOLN: 0.5000 mg | INTRAMUSCULAR | Status: DC | PRN

## 2013-03-08 MED ORDER — OXYCODONE HCL 5 MG PO TABS
5.0000 mg | ORAL_TABLET | ORAL | Status: DC | PRN
  Administered 2013-03-08 – 2013-03-09 (×3): 5 mg via ORAL
  Filled 2013-03-08 (×3): qty 1

## 2013-03-08 MED ORDER — LIDOCAINE HCL (PF) 1 % IJ SOLN
INTRAMUSCULAR | Status: AC
  Administered 2013-03-08: 17:00:00
  Filled 2013-03-08: qty 5

## 2013-03-08 MED ORDER — POTASSIUM CHLORIDE CRYS ER 20 MEQ PO TBCR
40.0000 meq | EXTENDED_RELEASE_TABLET | Freq: Once | ORAL | Status: AC
  Administered 2013-03-08: 40 meq via ORAL
  Filled 2013-03-08: qty 2

## 2013-03-08 MED ORDER — HYDRALAZINE HCL 20 MG/ML IJ SOLN: 10.0000 mg | Freq: Four times a day (QID) | INTRAMUSCULAR | Status: DC | PRN

## 2013-03-08 MED ORDER — DILTIAZEM HCL ER COATED BEADS 120 MG PO CP24
ORAL_CAPSULE | ORAL | Status: AC
  Filled 2013-03-08: qty 1

## 2013-03-08 MED ORDER — MIRTAZAPINE 30 MG PO TABS
15.0000 mg | ORAL_TABLET | Freq: Every evening | ORAL | Status: DC | PRN
  Administered 2013-03-08: 15 mg via ORAL
  Filled 2013-03-08 (×2): qty 1

## 2013-03-08 MED ORDER — DOUBLE ANTIBIOTIC 500-10000 UNIT/GM EX OINT
TOPICAL_OINTMENT | Freq: Two times a day (BID) | CUTANEOUS | Status: DC
  Administered 2013-03-08 – 2013-03-09 (×2): 1 via TOPICAL
  Filled 2013-03-08 (×2): qty 1

## 2013-03-08 MED ORDER — CLONIDINE HCL 0.2 MG PO TABS: 0.2000 mg | ORAL_TABLET | Freq: Once | ORAL | Status: AC

## 2013-03-08 MED ORDER — ACETAMINOPHEN 325 MG PO TABS
650.0000 mg | ORAL_TABLET | Freq: Four times a day (QID) | ORAL | Status: DC | PRN
  Administered 2013-03-08: 650 mg via ORAL
  Filled 2013-03-08: qty 2

## 2013-03-08 MED ORDER — POTASSIUM CHLORIDE CRYS ER 20 MEQ PO TBCR
40.0000 meq | EXTENDED_RELEASE_TABLET | ORAL | Status: AC
  Administered 2013-03-08 (×2): 40 meq via ORAL
  Filled 2013-03-08: qty 2

## 2013-03-08 MED ORDER — TETANUS-DIPHTH-ACELL PERTUSSIS 5-2.5-18.5 LF-MCG/0.5 IM SUSP
0.5000 mL | Freq: Once | INTRAMUSCULAR | Status: AC
  Administered 2013-03-08: 0.5 mL via INTRAMUSCULAR
  Filled 2013-03-08: qty 0.5

## 2013-03-08 MED ORDER — CLONIDINE HCL 0.2 MG PO TABS
0.2000 mg | ORAL_TABLET | Freq: Two times a day (BID) | ORAL | Status: DC
  Administered 2013-03-09: 0.2 mg via ORAL
  Filled 2013-03-08: qty 1

## 2013-03-08 MED ORDER — POTASSIUM CHLORIDE IN NACL 40-0.9 MEQ/L-% IV SOLN
INTRAVENOUS | Status: AC
  Filled 2013-03-08: qty 2000

## 2013-03-08 MED ORDER — PANTOPRAZOLE SODIUM 40 MG PO TBEC
40.0000 mg | DELAYED_RELEASE_TABLET | Freq: Every day | ORAL | Status: DC
  Administered 2013-03-08 – 2013-03-09 (×2): 40 mg via ORAL
  Filled 2013-03-08 (×2): qty 1

## 2013-03-08 MED ORDER — METOPROLOL SUCCINATE ER 50 MG PO TB24
ORAL_TABLET | ORAL | Status: AC
  Filled 2013-03-08: qty 2

## 2013-03-08 NOTE — ED Notes (Signed)
Called [place] next door to Dr. Ethelda Chick at this time. Timothy [name]
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
## 2013-03-08 NOTE — ED Notes (Signed)
Report called to Misty Stanley, RN on unit 300.
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
## 2013-03-08 NOTE — ED Notes (Signed)
CRITICAL VALUE ALERT  Critical value received:  K 2.4  Date of notification:  03/08/2013  Time of notification:  1610  Critical value read back:yes  Nurse who received alert:  [facility]  MD notified (1st page):  Dr. Ethelda Chick  Time of first page:  1610  MD notified (2nd page):  Time of second page:  Responding MD:  Dr. Felix Pacini  Time MD responded: [phone]

## 2013-03-08 NOTE — ED Notes (Signed)
Laceration to right forearm cleaned with NS, wound bandaged with telfa, 4X4's, tape, pt tolerated well.

## 2013-03-09 ENCOUNTER — Ambulatory Visit: Payer: Medicare Other | Admitting: Physician Assistant

## 2013-03-09 DIAGNOSIS — I10 Essential (primary) hypertension: Secondary | ICD-10-CM | POA: Diagnosis not present

## 2013-03-09 DIAGNOSIS — F411 Generalized anxiety disorder: Secondary | ICD-10-CM

## 2013-03-09 DIAGNOSIS — I456 Pre-excitation syndrome: Secondary | ICD-10-CM

## 2013-03-09 DIAGNOSIS — I2510 Atherosclerotic heart disease of native coronary artery without angina pectoris: Secondary | ICD-10-CM

## 2013-03-09 DIAGNOSIS — I517 Cardiomegaly: Secondary | ICD-10-CM

## 2013-03-09 DIAGNOSIS — S51809A Unspecified open wound of unspecified forearm, initial encounter: Secondary | ICD-10-CM | POA: Diagnosis not present

## 2013-03-09 DIAGNOSIS — E876 Hypokalemia: Secondary | ICD-10-CM | POA: Diagnosis not present

## 2013-03-09 DIAGNOSIS — R002 Palpitations: Secondary | ICD-10-CM | POA: Diagnosis not present

## 2013-03-09 DIAGNOSIS — IMO0002 Reserved for concepts with insufficient information to code with codable children: Secondary | ICD-10-CM | POA: Diagnosis not present

## 2013-03-09 LAB — CBC WITH DIFFERENTIAL/PLATELET
Basophils Relative: 1 % (ref 0–1)
Eosinophils Absolute: 0.2 10*3/uL (ref 0.0–0.7)
MCH: 32.3 pg (ref 26.0–34.0)
MCHC: 35.3 g/dL (ref 30.0–36.0)
Neutrophils Relative %: 45 % (ref 43–77)
Platelets: 275 10*3/uL (ref 150–400)
RDW: 12.9 % (ref 11.5–15.5)

## 2013-03-09 LAB — COMPREHENSIVE METABOLIC PANEL
Albumin: 3.2 g/dL — ABNORMAL LOW (ref 3.5–5.2)
Alkaline Phosphatase: 91 U/L (ref 39–117)
BUN: 15 mg/dL (ref 6–23)
Chloride: 95 mEq/L — ABNORMAL LOW (ref 96–112)
GFR calc Af Amer: >90 mL/min (ref >90)
Glucose, Bld: 90 mg/dL (ref 70–99)
Potassium: 2.9 mEq/L — ABNORMAL LOW (ref 3.5–5.1)
Total Bilirubin: 1.2 mg/dL (ref 0.3–1.2)

## 2013-03-09 LAB — URINALYSIS, ROUTINE W REFLEX MICROSCOPIC
Bilirubin Urine: NEGATIVE
Leukocytes, UA: NEGATIVE
Nitrite: NEGATIVE
Specific Gravity, Urine: 1.02 (ref 1.005–1.030)
pH: 6 (ref 5.0–8.0)

## 2013-03-09 LAB — IRON AND TIBC: UIBC: 334 ug/dL (ref 125–400)

## 2013-03-09 LAB — LIPID PANEL
Cholesterol: 182 mg/dL (ref 0–200)
HDL: 49 mg/dL (ref >39)
LDL Cholesterol: 109 mg/dL — ABNORMAL HIGH (ref 0–99)
Triglycerides: 121 mg/dL (ref <150)

## 2013-03-09 LAB — MAGNESIUM: Magnesium: 2.3 mg/dL (ref 1.5–2.5)

## 2013-03-09 LAB — TSH: TSH: 2.181 u[IU]/mL (ref 0.350–4.500)

## 2013-03-09 LAB — URINE MICROSCOPIC-ADD ON

## 2013-03-09 LAB — FERRITIN: Ferritin: 83 ng/mL (ref 22–322)

## 2013-03-09 MED ORDER — CLONIDINE HCL 0.2 MG PO TABS: 0.2000 mg | ORAL_TABLET | Freq: Two times a day (BID) | ORAL | Status: DC

## 2013-03-09 MED ORDER — POTASSIUM CHLORIDE CRYS ER 20 MEQ PO TBCR: 40.0000 meq | EXTENDED_RELEASE_TABLET | Freq: Two times a day (BID) | ORAL | Status: DC

## 2013-03-09 MED ORDER — POTASSIUM CHLORIDE CRYS ER 20 MEQ PO TBCR
40.0000 meq | EXTENDED_RELEASE_TABLET | Freq: Two times a day (BID) | ORAL | Status: DC
  Administered 2013-03-09: 40 meq via ORAL
  Filled 2013-03-09: qty 2

## 2013-03-09 MED ORDER — GUAIFENESIN-DM 100-10 MG/5ML PO SYRP: 5.0000 mL | ORAL_SOLUTION | Freq: Four times a day (QID) | ORAL | Status: DC | PRN

## 2013-03-09 MED ORDER — DOUBLE ANTIBIOTIC 500-10000 UNIT/GM EX OINT: 1.0000 "application " | TOPICAL_OINTMENT | Freq: Two times a day (BID) | CUTANEOUS | Status: DC

## 2013-03-09 MED ORDER — NICOTINE 21 MG/24HR TD PT24: 1.0000 | MEDICATED_PATCH | TRANSDERMAL | Status: DC

## 2013-03-09 MED ORDER — POTASSIUM CHLORIDE CRYS ER 20 MEQ PO TBCR: 40.0000 meq | EXTENDED_RELEASE_TABLET | Freq: Every day | ORAL | Status: DC

## 2013-03-09 MED ORDER — CLONIDINE HCL 0.2 MG PO TABS: 0.2000 mg | ORAL_TABLET | Freq: Two times a day (BID) | ORAL | Status: DC | PRN

## 2013-03-09 MED ORDER — HYDROCHLOROTHIAZIDE 25 MG PO TABS: 25.0000 mg | ORAL_TABLET | Freq: Every day | ORAL | Status: DC

## 2013-03-09 NOTE — Discharge Summary (Signed)
Patient seen and agree. Discussed with Ms. Vedia Coffer. Agree with discharge.  Brendia Sacks, MD [facility] [phone]
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
## 2013-03-09 NOTE — Progress Notes (Signed)
Prescriptions given,states understanding of discharge 

## 2013-03-09 NOTE — Progress Notes (Addendum)
[facility] PROGRESS NOTE  Timothy [name] ZOX:[id number] DOB: 1971/03/01 DOA: 03/08/2013 PCP: Cassell Smiles., MD  Interval history/Subjective: Feels better. No new issues.  Objective: Afebrile, vitals stable. Blood pressure well controlled, heart rate within normal limits. Telemetry sinus bradycardia. No arrhythmias noted. Cardiovascular regular rate and rhythm. Respiratory clear to auscultation bilaterally. No wheezes, rales, rhonchi. Normal respiratory effort. Abdomen soft, no definite tenderness right upper quadrant.  Labs/studies: Cardiac enzymes negative. White blood cell count, hemoglobin and hematocrit within normal limits. TSH within normal limits. Urinalysis negative. Magnesium normal 2.3. Potassium improved 2.9.  Assessment:  Palpitations, history of WPW status post ablation. Resolved. Followed closely by Dr. Shirlee Latch with plans to refer to Dr. Graciela Husbands for a Reveal monitor.  Chest tightness, secondary to palpitations and hypokalemia. Cardiac enzymes negative. No further evaluation suggested by cardiology. 2-D echocardiogram pending.  Hypokalemia, Likely secondary to hydrochlorothiazide. Improved with repletion. Further repletion today. Magnesium normal.   Uncontrolled hypertension, aggravated by missed doses of chronic hypertensives yesterday. Now well-controlled.  Polycythemia, thought to be secondary to cigarette smoking.   Chronic anxiety   Tobacco abuse, recommend cessation   Laceration right forearm Chronic right upper quadrant abdominal pain. He has had extensive evaluation the outpatient setting including abdominal ultrasound and CT of the abdomen which were unremarkable. Liver function studies unremarkable, no leukocytosis. Given chronicity of symptoms without evidence of acute abnormality, further investigation can be deferred to the outpatient setting.  Plan:  Discussed with Dr. Dietrich Pates, he advises discharge home today without further evaluation, close  followup with Dr. Shirlee Latch. Recommends decreasing hydrochlorothiazide to 25 mg daily, increasing potassium to 40 mEq daily.   Continue diltiazem, clonidine twice a day, metoprolol.  Remove staples right forearm in 6 days  Recommend smoking cessation  Summary: 42 year old man presented with palpitations after lacerating right forearm.  PMH includes:  WPW  Brendia Sacks, MD [facility] [phone]

## 2013-03-09 NOTE — Progress Notes (Signed)
UR chart review completed.  
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
## 2013-03-09 NOTE — Progress Notes (Signed)
*  PRELIMINARY RESULTS* Echocardiogram 2D Echocardiogram has been performed.  Timothy [name] 03/09/2013, 9:49 AM

## 2013-03-10 ENCOUNTER — Ambulatory Visit: Payer: Medicare Other | Admitting: Cardiology

## 2013-03-11 NOTE — Progress Notes (Signed)
Entered in error---pt cancelled appointment

## 2013-03-16 ENCOUNTER — Ambulatory Visit: Payer: Medicare Other | Admitting: Cardiology

## 2013-03-16 ENCOUNTER — Ambulatory Visit (INDEPENDENT_AMBULATORY_CARE_PROVIDER_SITE_OTHER): Payer: Medicare Other | Admitting: Internal Medicine

## 2013-03-16 DIAGNOSIS — R0989 Other specified symptoms and signs involving the circulatory and respiratory systems: Secondary | ICD-10-CM

## 2013-03-17 ENCOUNTER — Encounter: Payer: Self-pay | Admitting: Cardiology

## 2013-03-21 ENCOUNTER — Ambulatory Visit (INDEPENDENT_AMBULATORY_CARE_PROVIDER_SITE_OTHER): Payer: Self-pay | Admitting: Internal Medicine

## 2013-03-22 ENCOUNTER — Encounter: Payer: Self-pay | Admitting: *Deleted

## 2013-03-23 ENCOUNTER — Institutional Professional Consult (permissible substitution): Payer: Medicare Other | Admitting: Internal Medicine

## 2013-03-30 ENCOUNTER — Encounter: Payer: Self-pay | Admitting: Internal Medicine

## 2013-03-31 DIAGNOSIS — I499 Cardiac arrhythmia, unspecified: Secondary | ICD-10-CM | POA: Diagnosis not present

## 2013-03-31 DIAGNOSIS — R109 Unspecified abdominal pain: Secondary | ICD-10-CM | POA: Diagnosis not present

## 2013-03-31 DIAGNOSIS — Z6829 Body mass index (BMI) 29.0-29.9, adult: Secondary | ICD-10-CM | POA: Diagnosis not present

## 2013-03-31 DIAGNOSIS — E876 Hypokalemia: Secondary | ICD-10-CM | POA: Diagnosis not present

## 2013-03-31 DIAGNOSIS — G8929 Other chronic pain: Secondary | ICD-10-CM | POA: Diagnosis not present

## 2013-04-05 ENCOUNTER — Encounter: Payer: Self-pay | Admitting: General Practice

## 2013-05-03 ENCOUNTER — Ambulatory Visit (INDEPENDENT_AMBULATORY_CARE_PROVIDER_SITE_OTHER): Payer: Medicare Other | Admitting: Gastroenterology

## 2013-05-03 ENCOUNTER — Encounter: Payer: Self-pay | Admitting: Gastroenterology

## 2013-05-03 VITALS — BP 182/117 | HR 94 | Temp 98.2°F | Ht 72.0 in | Wt 225.0 lb

## 2013-05-03 DIAGNOSIS — G8929 Other chronic pain: Secondary | ICD-10-CM

## 2013-05-03 DIAGNOSIS — R1011 Right upper quadrant pain: Secondary | ICD-10-CM | POA: Diagnosis not present

## 2013-05-03 DIAGNOSIS — Z800 Family history of malignant neoplasm of digestive organs: Secondary | ICD-10-CM | POA: Diagnosis not present

## 2013-05-03 DIAGNOSIS — K625 Hemorrhage of anus and rectum: Secondary | ICD-10-CM

## 2013-05-03 DIAGNOSIS — R198 Other specified symptoms and signs involving the digestive system and abdomen: Secondary | ICD-10-CM

## 2013-05-03 DIAGNOSIS — I10 Essential (primary) hypertension: Secondary | ICD-10-CM | POA: Diagnosis not present

## 2013-05-03 DIAGNOSIS — Z6830 Body mass index (BMI) 30.0-30.9, adult: Secondary | ICD-10-CM | POA: Diagnosis not present

## 2013-05-03 DIAGNOSIS — R194 Change in bowel habit: Secondary | ICD-10-CM | POA: Insufficient documentation

## 2013-05-03 MED ORDER — DEXLANSOPRAZOLE 60 MG PO CPDR: 60.0000 mg | DELAYED_RELEASE_CAPSULE | Freq: Every day | ORAL | Status: DC

## 2013-05-03 MED ORDER — PEG 3350-KCL-NA BICARB-NACL 420 G PO SOLR: 4000.0000 mL | ORAL | Status: DC

## 2013-05-03 NOTE — Progress Notes (Signed)
Primary Care Physician:  FUSCO,LAWRENCE J., MD  Primary Gastroenterologist:  Michael Rourk, MD   Chief Complaint  Patient presents with  . Abdominal Pain    HPI:  Timothy [name] is a 42 y.o. male here for further evaluation of right upper quadrant abdominal pain.  Four months ago started having RUQ pain and radiating to back. When it first started, stool was white/clay colored. Went on for three days. Certain foods make pain worse. Spicy foods/oranges/peanut butter/pears/apples/fatty food. Has tried multiple antacids including ranitidine, Rolaids without relief. Burning type pain. In the beginning, had N/V. Etoh "sets it off the chart". No heavy use per patient. At the most three beers a day on the weekends. No heartburn. No dysphagia. "Lots of bubbling" in the stomach. Ends up with diarrhea. Stools have been much softer than normal the last four months. Before was once in AM and then done. No melena. Rare brbpr on toilet tissue with hemorrhoids. No weight loss. Gained 35 pounds in the last six months. 40 ounces of Diet Mountain Dew per day. 48 ounces of water daily. Denies NSAIDs.  Recent CT and abdominal ultrasound as outlined below.  Admitted in June of 2014 when he presented with laceration on his right arm. Noted to have hypokalemia, palpitations. Given history of Wolff-Parkinson-White syndrome he was admitted for observation.  Current Outpatient Prescriptions  Medication Sig Dispense Refill  . acetaminophen (TYLENOL) 500 MG tablet Take 500-1,500 mg by mouth daily as needed for pain.      . aspirin 81 MG tablet Take 81 mg by mouth daily.        . CARTIA XT 120 MG 24 hr capsule Take 1 capsule by mouth every evening.      . cloNIDine (CATAPRES) 0.2 MG tablet Take 1 tablet (0.2 mg total) by mouth 2 (two) times daily.  60 tablet  6  . diltiazem (CARDIZEM CD) 240 MG 24 hr capsule Take 240 mg by mouth every morning.      . hydrochlorothiazide (HYDRODIURIL) 25 MG tablet Take 1 tablet (25 mg  total) by mouth daily.  30 tablet  0  . metoprolol (TOPROL-XL) 100 MG 24 hr tablet Take 100 mg by mouth 2 (two) times daily.        . mirtazapine (REMERON) 15 MG tablet Take 15 mg by mouth at bedtime as needed and may repeat dose one time if needed (for sleep).       . Multiple Vitamins-Minerals (MENS MULTIVITAMIN PLUS PO) Take 1 tablet by mouth daily.      . Naphazoline HCl (CLEAR EYES OP) Apply 1 drop to eye daily as needed. For red eyes       . oxycodone (OXY-IR) 5 MG capsule Take 5 mg by mouth every 6 (six) hours as needed. For pain. May take 3 to 4 times daily.       . potassium chloride SA (K-DUR,KLOR-CON) 20 MEQ tablet Take 40 mEq by mouth 2 (two) times daily.       No current facility-administered medications for this visit.    Allergies as of 05/03/2013 - Review Complete 05/03/2013  Allergen Reaction Noted  . Morphine Other (See Comments)     Past Medical History  Diagnosis Date  . Tachycardia   . Palpitations   . Syncope   . Anxiety   . HTN (hypertension)   . Wolff-Parkinson-White (WPW) syndrome     says was cured with ablation  . Central sleep apnea   . Tobacco abuse   .   CAD (coronary artery disease) 2007    50% stenosis small diagonal  . Chronic pain     since 1998 has been on chronic pain medication    Past Surgical History  Procedure Laterality Date  . Atrial ablation surgery    . Appendectomy    . Adenoidectomy    . Hand surgery    . Shoulder surgery      Family History  Problem Relation Age of Onset  . Coronary artery disease Father     cabg  . Breast cancer Mother   . Colon cancer Father     age 50, chemo/surgery    History   Social History  . Marital Status: Married    Spouse Name: N/A    Number of Children: 2  . Years of Education: N/A   Occupational History  . unemployed    Social History Main Topics  . Smoking status: Current Every Day Smoker    Types: Cigarettes  . Smokeless tobacco: Not on file  . Alcohol Use: No  . Drug Use:  No  . Sexual Activity: Yes   Other Topics Concern  . Not on file   Social History Narrative  . No narrative on file      ROS:  General: Negative for anorexia, weight loss, fever, chills, fatigue, weakness. Eyes: Negative for vision changes.  ENT: Negative for hoarseness, difficulty swallowing , nasal congestion. CV: Negative for chest pain, angina, palpitations, dyspnea on exertion, peripheral edema.  Respiratory: Negative for dyspnea at rest, dyspnea on exertion, cough, sputum, wheezing.  GI: See history of present illness. GU:  Negative for dysuria, hematuria, urinary incontinence, urinary frequency, nocturnal urination.  MS: Chronic low back pain.  Derm: Negative for rash or itching.  Neuro: Negative for weakness, abnormal sensation, seizure, frequent headaches, memory loss, confusion.  Psych: Negative for  depression, suicidal ideation, hallucinations. Positive anxiety Endo: Negative for unusual weight change.  Heme: Negative for bruising or bleeding. Allergy: Negative for rash or hives.    Physical Examination:  BP 182/117  Pulse 94  Temp(Src) 98.2 F (36.8 C) (Oral)  Ht 6' (1.829 m)  Wt 225 lb (102.059 kg)  BMI 30.51 kg/m2   General: Well-nourished, well-developed in no acute distress.  Head: Normocephalic, atraumatic.   Eyes: Conjunctiva pink, no icterus. Mouth: Oropharyngeal mucosa moist and pink , no lesions erythema or exudate. Neck: Supple without thyromegaly, masses, or lymphadenopathy.  Lungs: Clear to auscultation bilaterally.  Heart: Regular rate and rhythm, no murmurs rubs or gallops.  Abdomen: Tenderness noted in the epigastric/right upper quadrant. He also has tenderness over the right lower rib cage margin. Bowel sounds are normal, nondistended, no hepatosplenomegaly or masses, no abdominal bruits or    hernia , no rebound or guarding.   Rectal: Not performed Extremities: No lower extremity edema. No clubbing or deformities.  Neuro: Alert and  oriented x 4 , grossly normal neurologically.  Skin: Warm and dry, no rash or jaundice.   Psych: Alert and cooperative, normal mood and affect.  Labs: Lab Results  Component Value Date   CREATININE 1.01 03/09/2013   BUN 15 03/09/2013   NA 134* 03/09/2013   K 2.9* 03/09/2013   CL 95* 03/09/2013   CO2 34* 03/09/2013   Lab Results  Component Value Date   ALT 32 03/09/2013   AST 25 03/09/2013   ALKPHOS 91 03/09/2013   BILITOT 1.2 03/09/2013   Lab Results  Component Value Date   WBC 7.2 03/09/2013   HGB   17.0 03/09/2013   HCT 48.1 03/09/2013   MCV 91.3 03/09/2013   PLT 275 03/09/2013   Lab Results  Component Value Date   IRON 64 03/08/2013   TIBC 398 03/08/2013   FERRITIN 83 03/08/2013     Imaging Studies: CT of the abdomen pelvis with contrast on 03/07/2013 Probable mild hepatic steatosis. Prominent L4-L5 disc bulge.  Abdominal ultrasound on 02/01/2013 Fatty liver.    

## 2013-05-03 NOTE — Assessment & Plan Note (Addendum)
42 year old with 4 month history of epigastric/right upper quadrant abdominal pain, initially associated with nausea/vomiting, "clay-colored stools", loose stool. He has had CT of the abdomen pelvis, abdominal ultrasound, LFTs which were all normal. Tried over-the-counter antacids without relief. He notes increased pain related to certain foods. Denies typical heartburn. Also notably on exam he has pain over the rib cage which is likely musculoskeletal.  Other GI issues included change in bowel habits, bright red blood per thumb at times, family history of colon cancer at a young age. Overdue for colonoscopy at this time.  At this point, would recommend EGD and colonoscopy for further evaluation of above-stated symptoms and for colon cancer screening. Patient requires chronic narcotics therefore we'll offer deep sedation in the OR. I have discussed the risks, alternatives, benefits with regards to but not limited to the risk of reaction to medication, bleeding, infection, perforation and the patient is agreeable to proceed. Written consent to be obtained.  Start Dexilant 60mg  daily.   If abdominal pain remains unexplained after procedures, consider HIDA scan to complete GI work-up.

## 2013-05-03 NOTE — Patient Instructions (Addendum)
1. Colonoscopy and upper endoscopy with Dr. Jena Gauss in the near future. Please see separate instructions. 2. Please try Dexilant 60mg  daily in the interim. Samples provided.

## 2013-05-03 NOTE — Progress Notes (Signed)
CC'd to PCP 

## 2013-05-03 NOTE — Assessment & Plan Note (Signed)
Patient did not take his BP pills this morning. He plans to take as soon as he gets home. He will check his BP to ensure response.

## 2013-05-05 NOTE — Patient Instructions (Addendum)
20    Your procedure is scheduled on: 05/12/2013  Report to [facility] at   7:00  AM.  Call this number if you have problems the morning of surgery: [phone]   Remember:   Do not drink or eat food:After Midnight.    Clear liquids include soda, tea, black coffee, apple or grape juice, broth.  Take these medicines the morning of surgery with A SIP OF WATER: Diltiazem, Metoprolol, Dexilant, Clonidine, and Cartia   Do not wear jewelry, make-up or nail polish.  Do not wear lotions, powders, or perfumes. You may wear deodorant.  Do not shave 48 hours prior to surgery. Men may shave face and neck.  Do not bring valuables to the hospital.  Contacts, dentures or bridgework may not be worn into surgery.  Leave suitcase in the car. After surgery it may be brought to your room.  For patients admitted to the hospital, checkout time is 11:00 AM the day of discharge.   Patients discharged the day of surgery will not be allowed to drive home.  Name and phone number of your driver:    Please read over the following fact sheets that you were given: Pain Booklet, Lab Information and Anesthesia Post-op Instructions   Endoscopy Care After Please read the instructions outlined below and refer to this sheet in the next few weeks. These discharge instructions provide you with general information on caring for yourself after you leave the hospital. Your doctor may also give you specific instructions. While your treatment has been planned according to the most current medical practices available, unavoidable complications occasionally occur. If you have any problems or questions after discharge, please call your doctor. HOME CARE INSTRUCTIONS Activity  You may resume your regular activity but move at a slower pace for the next 24 hours.   Take frequent rest periods for the next 24 hours.   Walking will help expel (get rid of) the air and reduce the bloated feeling in your abdomen.   No driving for 24  hours (because of the anesthesia (medicine) used during the test).   You may shower.   Do not sign any important legal documents or operate any machinery for 24 hours (because of the anesthesia used during the test).  Nutrition  Drink plenty of fluids.   You may resume your normal diet.   Begin with a light meal and progress to your normal diet.   Avoid alcoholic beverages for 24 hours or as instructed by your caregiver.  Medications You may resume your normal medications unless your caregiver tells you otherwise. What you can expect today  You may experience abdominal discomfort such as a feeling of fullness or "gas" pains.   You may experience a sore throat for 2 to 3 days. This is normal. Gargling with salt water may help this.  Follow-up Your doctor will discuss the results of your test with you. SEEK IMMEDIATE MEDICAL CARE IF:  You have excessive nausea (feeling sick to your stomach) and/or vomiting.   You have severe abdominal pain and distention (swelling).   You have trouble swallowing.   You have a temperature over 100 F (37.8 C).   You have rectal bleeding or vomiting of blood.  Document Released: 04/15/2004 Document Revised: 08/21/2011 Document Reviewed: 10/27/2007 Colonoscopy Care After Read the instructions outlined below and refer to this sheet in the next few weeks. These discharge instructions provide you with general information on caring for yourself after you leave the hospital. Your doctor  may also give you specific instructions. While your treatment has been planned according to the most current medical practices available, unavoidable complications occasionally occur. If you have any problems or questions after discharge, call your doctor. HOME CARE INSTRUCTIONS ACTIVITY:  You may resume your regular activity, but move at a slower pace for the next 24 hours.  Take frequent rest periods for the next 24 hours.  Walking will help get rid of the air  and reduce the bloated feeling in your belly (abdomen).  No driving for 24 hours (because of the medicine (anesthesia) used during the test).  You may shower.  Do not sign any important legal documents or operate any machinery for 24 hours (because of the anesthesia used during the test). NUTRITION:  Drink plenty of fluids.  You may resume your normal diet as instructed by your doctor.  Begin with a light meal and progress to your normal diet. Heavy or fried foods are harder to digest and may make you feel sick to your stomach (nauseated).  Avoid alcoholic beverages for 24 hours or as instructed. MEDICATIONS:  You may resume your normal medications unless your doctor tells you otherwise. WHAT TO EXPECT TODAY:  Some feelings of bloating in the abdomen.  Passage of more gas than usual.  Spotting of blood in your stool or on the toilet paper. IF YOU HAD POLYPS REMOVED DURING THE COLONOSCOPY:  No aspirin products for 7 days or as instructed.  No alcohol for 7 days or as instructed.  Eat a soft diet for the next 24 hours. FINDING OUT THE RESULTS OF YOUR TEST Not all test results are available during your visit. If your test results are not back during the visit, make an appointment with your caregiver to find out the results. Do not assume everything is normal if you have not heard from your caregiver or the medical facility. It is important for you to follow up on all of your test results.  SEEK IMMEDIATE MEDICAL CARE IF:  You have more than a spotting of blood in your stool.  Your belly is swollen (abdominal distention).  You are nauseated or vomiting.  You have a fever.  You have abdominal pain or discomfort that is severe or gets worse throughout the day. Document Released: 04/15/2004 Document Revised: 11/24/2011 Document Reviewed: 04/13/2008 [facility] Patient Information 2014 [place], [place].

## 2013-05-06 ENCOUNTER — Encounter (HOSPITAL_COMMUNITY): Payer: Self-pay | Admitting: Pharmacy Technician

## 2013-05-06 ENCOUNTER — Encounter (HOSPITAL_COMMUNITY)
Admission: RE | Admit: 2013-05-06 | Discharge: 2013-05-06 | Disposition: A | Discharge status: Home / Self Care | Payer: Medicare Other | Source: Ambulatory Visit | Attending: Internal Medicine | Admitting: Internal Medicine

## 2013-05-06 ENCOUNTER — Encounter (HOSPITAL_COMMUNITY): Payer: Self-pay

## 2013-05-06 DIAGNOSIS — Z01812 Encounter for preprocedural laboratory examination: Secondary | ICD-10-CM | POA: Insufficient documentation

## 2013-05-06 DIAGNOSIS — D45 Polycythemia vera: Secondary | ICD-10-CM | POA: Insufficient documentation

## 2013-05-06 HISTORY — DX: Gastro-esophageal reflux disease without esophagitis: K21.9

## 2013-05-06 HISTORY — DX: Unspecified osteoarthritis, unspecified site: M19.90

## 2013-05-06 LAB — HEMOGLOBIN AND HEMATOCRIT, BLOOD: Hemoglobin: 17.2 g/dL — ABNORMAL HIGH (ref 13.0–17.0)

## 2013-05-06 LAB — BASIC METABOLIC PANEL
Calcium: 9.3 mg/dL (ref 8.4–10.5)
GFR calc Af Amer: >90 mL/min (ref >90)
GFR calc non Af Amer: >90 mL/min (ref >90)
Potassium: 3.9 mEq/L (ref 3.5–5.1)
Sodium: 133 mEq/L — ABNORMAL LOW (ref 135–145)

## 2013-05-10 NOTE — Progress Notes (Signed)
Quick Note:  Pre-op labs. Needs to see PCP about chronically low Na. Send copy to PCP. ______

## 2013-05-12 ENCOUNTER — Ambulatory Visit (HOSPITAL_COMMUNITY)
Admission: RE | Admit: 2013-05-12 | Discharge: 2013-05-12 | Disposition: A | Discharge status: Home / Self Care | Payer: Medicare Other | Source: Ambulatory Visit | Attending: Internal Medicine | Admitting: Internal Medicine

## 2013-05-12 ENCOUNTER — Encounter (HOSPITAL_COMMUNITY): Payer: Self-pay | Admitting: *Deleted

## 2013-05-12 ENCOUNTER — Ambulatory Visit (HOSPITAL_COMMUNITY): Payer: Medicare Other | Admitting: Anesthesiology

## 2013-05-12 ENCOUNTER — Encounter (HOSPITAL_COMMUNITY)
Admission: RE | Disposition: A | Discharge status: Home / Self Care | Payer: Self-pay | Source: Ambulatory Visit | Attending: Internal Medicine

## 2013-05-12 ENCOUNTER — Encounter (HOSPITAL_COMMUNITY): Payer: Self-pay | Admitting: Anesthesiology

## 2013-05-12 ENCOUNTER — Telehealth: Payer: Self-pay

## 2013-05-12 DIAGNOSIS — D126 Benign neoplasm of colon, unspecified: Secondary | ICD-10-CM | POA: Insufficient documentation

## 2013-05-12 DIAGNOSIS — I10 Essential (primary) hypertension: Secondary | ICD-10-CM | POA: Diagnosis not present

## 2013-05-12 DIAGNOSIS — Z85038 Personal history of other malignant neoplasm of large intestine: Secondary | ICD-10-CM | POA: Diagnosis not present

## 2013-05-12 DIAGNOSIS — K921 Melena: Secondary | ICD-10-CM | POA: Insufficient documentation

## 2013-05-12 DIAGNOSIS — K2100 Gastro-esophageal reflux disease with esophagitis, without bleeding: Secondary | ICD-10-CM | POA: Insufficient documentation

## 2013-05-12 DIAGNOSIS — R1011 Right upper quadrant pain: Secondary | ICD-10-CM | POA: Diagnosis not present

## 2013-05-12 DIAGNOSIS — K449 Diaphragmatic hernia without obstruction or gangrene: Secondary | ICD-10-CM | POA: Diagnosis not present

## 2013-05-12 DIAGNOSIS — D128 Benign neoplasm of rectum: Secondary | ICD-10-CM | POA: Insufficient documentation

## 2013-05-12 DIAGNOSIS — K648 Other hemorrhoids: Secondary | ICD-10-CM | POA: Diagnosis not present

## 2013-05-12 DIAGNOSIS — K620 Anal polyp: Secondary | ICD-10-CM | POA: Diagnosis not present

## 2013-05-12 DIAGNOSIS — K621 Rectal polyp: Secondary | ICD-10-CM

## 2013-05-12 DIAGNOSIS — K625 Hemorrhage of anus and rectum: Secondary | ICD-10-CM | POA: Diagnosis not present

## 2013-05-12 HISTORY — PX: POLYPECTOMY: SHX5525

## 2013-05-12 HISTORY — PX: COLONOSCOPY WITH PROPOFOL: SHX5780

## 2013-05-12 HISTORY — PX: BIOPSY: SHX5522

## 2013-05-12 HISTORY — PX: ESOPHAGOGASTRODUODENOSCOPY (EGD) WITH PROPOFOL: SHX5813

## 2013-05-12 SURGERY — COLONOSCOPY WITH PROPOFOL
Anesthesia: Monitor Anesthesia Care

## 2013-05-12 MED ORDER — FENTANYL CITRATE 0.05 MG/ML IJ SOLN
25.0000 ug | INTRAMUSCULAR | Status: AC
  Administered 2013-05-12 (×2): 25 ug via INTRAVENOUS

## 2013-05-12 MED ORDER — FENTANYL CITRATE 0.05 MG/ML IJ SOLN
INTRAMUSCULAR | Status: AC
  Filled 2013-05-12: qty 2

## 2013-05-12 MED ORDER — STERILE WATER FOR IRRIGATION IR SOLN
Status: DC | PRN
  Administered 2013-05-12: 08:00:00

## 2013-05-12 MED ORDER — PROPOFOL 10 MG/ML IV EMUL
INTRAVENOUS | Status: AC
  Filled 2013-05-12: qty 20

## 2013-05-12 MED ORDER — PROPOFOL INFUSION 10 MG/ML OPTIME
INTRAVENOUS | Status: DC | PRN
  Administered 2013-05-12: 50 ug/kg/min via INTRAVENOUS
  Administered 2013-05-12: 150 ug/kg/min via INTRAVENOUS

## 2013-05-12 MED ORDER — MIDAZOLAM HCL 2 MG/2ML IJ SOLN
1.0000 mg | INTRAMUSCULAR | Status: DC | PRN
  Administered 2013-05-12: 2 mg via INTRAVENOUS

## 2013-05-12 MED ORDER — LIDOCAINE HCL (PF) 1 % IJ SOLN
INTRAMUSCULAR | Status: AC
  Filled 2013-05-12: qty 5

## 2013-05-12 MED ORDER — ONDANSETRON HCL 4 MG/2ML IJ SOLN: 4.0000 mg | Freq: Once | INTRAMUSCULAR | Status: DC | PRN

## 2013-05-12 MED ORDER — WATER FOR IRRIGATION, STERILE IR SOLN
Status: DC | PRN
  Administered 2013-05-12: 1000 mL

## 2013-05-12 MED ORDER — BUTAMBEN-TETRACAINE-BENZOCAINE 2-2-14 % EX AERO
1.0000 | INHALATION_SPRAY | Freq: Once | CUTANEOUS | Status: AC
  Administered 2013-05-12: 1 via TOPICAL
  Filled 2013-05-12: qty 56

## 2013-05-12 MED ORDER — LACTATED RINGERS IV SOLN
INTRAVENOUS | Status: DC
  Administered 2013-05-12: 07:00:00 via INTRAVENOUS

## 2013-05-12 MED ORDER — ONDANSETRON HCL 4 MG/2ML IJ SOLN
4.0000 mg | Freq: Once | INTRAMUSCULAR | Status: AC
  Administered 2013-05-12: 4 mg via INTRAVENOUS

## 2013-05-12 MED ORDER — MIDAZOLAM HCL 2 MG/2ML IJ SOLN
INTRAMUSCULAR | Status: AC
  Filled 2013-05-12: qty 2

## 2013-05-12 MED ORDER — ONDANSETRON HCL 4 MG/2ML IJ SOLN
INTRAMUSCULAR | Status: AC
  Filled 2013-05-12: qty 2

## 2013-05-12 MED ORDER — FENTANYL CITRATE 0.05 MG/ML IJ SOLN: 25.0000 ug | INTRAMUSCULAR | Status: DC | PRN

## 2013-05-12 MED ORDER — FENTANYL CITRATE 0.05 MG/ML IJ SOLN
INTRAMUSCULAR | Status: DC | PRN
  Administered 2013-05-12 (×2): 25 ug via INTRAVENOUS
  Administered 2013-05-12 (×2): 50 ug via INTRAVENOUS

## 2013-05-12 SURGICAL SUPPLY — 25 items
BLOCK BITE 60FR ADLT L/F BLUE (MISCELLANEOUS) ×1 IMPLANT
DEVICE CLIP HEMOSTAT 235CM (CLIP) IMPLANT
ELECT REM PT RETURN 9FT ADLT (ELECTROSURGICAL) ×2
ELECTRODE REM PT RTRN 9FT ADLT (ELECTROSURGICAL) IMPLANT
FCP BXJMBJMB 240X2.8X (CUTTING FORCEPS) ×2
FLOOR PAD 36X40 (MISCELLANEOUS) ×2
FORCEPS BIOP RAD 4 LRG CAP 4 (CUTTING FORCEPS) IMPLANT
FORCEPS BIOP RJ4 240 W/NDL (CUTTING FORCEPS) ×4
FORCEPS BXJMBJMB 240X2.8X (CUTTING FORCEPS) IMPLANT
FORMALIN 10 PREFIL 20ML (MISCELLANEOUS) ×3 IMPLANT
INJECTOR/SNARE I SNARE (MISCELLANEOUS) IMPLANT
LUBRICANT JELLY 4.5OZ STERILE (MISCELLANEOUS) ×1 IMPLANT
MANIFOLD NEPTUNE II (INSTRUMENTS) ×1 IMPLANT
NDL SCLEROTHERAPY 25GX240 (NEEDLE) IMPLANT
NEEDLE SCLEROTHERAPY 25GX240 (NEEDLE) ×2 IMPLANT
PAD FLOOR 36X40 (MISCELLANEOUS) IMPLANT
PROBE APC STR FIRE (PROBE) IMPLANT
PROBE INJECTION GOLD (MISCELLANEOUS)
PROBE INJECTION GOLD 7FR (MISCELLANEOUS) IMPLANT
SNARE ROTATE MED OVAL 20MM (MISCELLANEOUS) ×1 IMPLANT
SYR 50ML LL SCALE MARK (SYRINGE) ×1 IMPLANT
TRAP SPECIMEN MUCOUS 40CC (MISCELLANEOUS) ×1 IMPLANT
TUBING ENDO SMARTCAP PENTAX (MISCELLANEOUS) ×1 IMPLANT
TUBING IRRIGATION ENDOGATOR (MISCELLANEOUS) ×1 IMPLANT
WATER STERILE IRR 1000ML POUR (IV SOLUTION) ×2 IMPLANT

## 2013-05-12 NOTE — H&P (View-Only) (Signed)
Primary Care Physician:  Cassell Smiles., MD  Primary Gastroenterologist:  Roetta Sessions, MD   Chief Complaint  Patient presents with  . Abdominal Pain    HPI:  Timothy [name] is a 42 y.o. male here for further evaluation of right upper quadrant abdominal pain.  Four months ago started having RUQ pain and radiating to back. When it first started, stool was white/clay colored. Went on for three days. Certain foods make pain worse. Spicy foods/oranges/peanut butter/pears/apples/fatty food. Has tried multiple antacids including ranitidine, Rolaids without relief. Burning type pain. In the beginning, had N/V. Etoh "sets it off the chart". No heavy use per patient. At the most three beers a day on the weekends. No heartburn. No dysphagia. "Lots of bubbling" in the stomach. Ends up with diarrhea. Stools have been much softer than normal the last four months. Before was once in AM and then done. No melena. Rare brbpr on toilet tissue with hemorrhoids. No weight loss. Gained 35 pounds in the last six months. 40 ounces of Diet Mountain Dew per day. 48 ounces of water daily. Denies NSAIDs.  Recent CT and abdominal ultrasound as outlined below.  Admitted in June of 2014 when he presented with laceration on his right arm. Noted to have hypokalemia, palpitations. Given history of Wolff-Parkinson-White syndrome he was admitted for observation.  Current Outpatient Prescriptions  Medication Sig Dispense Refill  . acetaminophen (TYLENOL) 500 MG tablet Take 500-1,500 mg by mouth daily as needed for pain.      Marland Kitchen aspirin 81 MG tablet Take 81 mg by mouth daily.        Marland Kitchen CARTIA XT 120 MG 24 hr capsule Take 1 capsule by mouth every evening.      . cloNIDine (CATAPRES) 0.2 MG tablet Take 1 tablet (0.2 mg total) by mouth 2 (two) times daily.  60 tablet  6  . diltiazem (CARDIZEM CD) 240 MG 24 hr capsule Take 240 mg by mouth every morning.      . hydrochlorothiazide (HYDRODIURIL) 25 MG tablet Take 1 tablet (25 mg  total) by mouth daily.  30 tablet  0  . metoprolol (TOPROL-XL) 100 MG 24 hr tablet Take 100 mg by mouth 2 (two) times daily.        . mirtazapine (REMERON) 15 MG tablet Take 15 mg by mouth at bedtime as needed and may repeat dose one time if needed (for sleep).       . Multiple Vitamins-Minerals (MENS MULTIVITAMIN PLUS PO) Take 1 tablet by mouth daily.      . Naphazoline HCl (CLEAR EYES OP) Apply 1 drop to eye daily as needed. For red eyes       . oxycodone (OXY-IR) 5 MG capsule Take 5 mg by mouth every 6 (six) hours as needed. For pain. May take 3 to 4 times daily.       . potassium chloride SA (K-DUR,KLOR-CON) 20 MEQ tablet Take 40 mEq by mouth 2 (two) times daily.       No current facility-administered medications for this visit.    Allergies as of 05/03/2013 - Review Complete 05/03/2013  Allergen Reaction Noted  . Morphine Other (See Comments)     Past Medical History  Diagnosis Date  . Tachycardia   . Palpitations   . Syncope   . Anxiety   . HTN (hypertension)   . Wolff-Parkinson-White (WPW) syndrome     says was cured with ablation  . Central sleep apnea   . Tobacco abuse   .  CAD (coronary artery disease) 2007    50% stenosis small diagonal  . Chronic pain     since 1998 has been on chronic pain medication    Past Surgical History  Procedure Laterality Date  . Atrial ablation surgery    . Appendectomy    . Adenoidectomy    . Hand surgery    . Shoulder surgery      Family History  Problem Relation Age of Onset  . Coronary artery disease Father     cabg  . Breast cancer Mother   . Colon cancer Father     age 48, chemo/surgery    History   Social History  . Marital Status: Married    Spouse Name: N/A    Number of Children: 2  . Years of Education: N/A   Occupational History  . unemployed    Social History Main Topics  . Smoking status: Current Every Day Smoker    Types: Cigarettes  . Smokeless tobacco: Not on file  . Alcohol Use: No  . Drug Use:  No  . Sexual Activity: Yes   Other Topics Concern  . Not on file   Social History Narrative  . No narrative on file      ROS:  General: Negative for anorexia, weight loss, fever, chills, fatigue, weakness. Eyes: Negative for vision changes.  ENT: Negative for hoarseness, difficulty swallowing , nasal congestion. CV: Negative for chest pain, angina, palpitations, dyspnea on exertion, peripheral edema.  Respiratory: Negative for dyspnea at rest, dyspnea on exertion, cough, sputum, wheezing.  GI: See history of present illness. GU:  Negative for dysuria, hematuria, urinary incontinence, urinary frequency, nocturnal urination.  MS: Chronic low back pain.  Derm: Negative for rash or itching.  Neuro: Negative for weakness, abnormal sensation, seizure, frequent headaches, memory loss, confusion.  Psych: Negative for  depression, suicidal ideation, hallucinations. Positive anxiety Endo: Negative for unusual weight change.  Heme: Negative for bruising or bleeding. Allergy: Negative for rash or hives.    Physical Examination:  BP 182/117  Pulse 94  Temp(Src) 98.2 F (36.8 C) (Oral)  Ht 6' (1.829 m)  Wt 225 lb (102.059 kg)  BMI 30.51 kg/m2   General: Well-nourished, well-developed in no acute distress.  Head: Normocephalic, atraumatic.   Eyes: Conjunctiva pink, no icterus. Mouth: Oropharyngeal mucosa moist and pink , no lesions erythema or exudate. Neck: Supple without thyromegaly, masses, or lymphadenopathy.  Lungs: Clear to auscultation bilaterally.  Heart: Regular rate and rhythm, no murmurs rubs or gallops.  Abdomen: Tenderness noted in the epigastric/right upper quadrant. He also has tenderness over the right lower rib cage margin. Bowel sounds are normal, nondistended, no hepatosplenomegaly or masses, no abdominal bruits or    hernia , no rebound or guarding.   Rectal: Not performed Extremities: No lower extremity edema. No clubbing or deformities.  Neuro: Alert and  oriented x 4 , grossly normal neurologically.  Skin: Warm and dry, no rash or jaundice.   Psych: Alert and cooperative, normal mood and affect.  Labs: Lab Results  Component Value Date   CREATININE 1.01 03/09/2013   BUN 15 03/09/2013   NA 134* 03/09/2013   K 2.9* 03/09/2013   CL 95* 03/09/2013   CO2 34* 03/09/2013   Lab Results  Component Value Date   ALT 32 03/09/2013   AST 25 03/09/2013   ALKPHOS 91 03/09/2013   BILITOT 1.2 03/09/2013   Lab Results  Component Value Date   WBC 7.2 03/09/2013   HGB  17.0 03/09/2013   HCT 48.1 03/09/2013   MCV 91.3 03/09/2013   PLT 275 03/09/2013   Lab Results  Component Value Date   IRON 64 03/08/2013   TIBC 398 03/08/2013   FERRITIN 83 03/08/2013     Imaging Studies: CT of the abdomen pelvis with contrast on 03/07/2013 Probable mild hepatic steatosis. Prominent L4-L5 disc bulge.  Abdominal ultrasound on 02/01/2013 Fatty liver.

## 2013-05-12 NOTE — Telephone Encounter (Signed)
Per RMR- pt needs to be put on the schedule for a hemorrhoid banding on a Wednesday afternoon (when he is not on call). Please schedule pt.

## 2013-05-12 NOTE — Transfer of Care (Signed)
Immediate Anesthesia Transfer of Care Note  Patient: Phillipe Clemon Holeman  Procedure(s) Performed: Procedure(s) (LRB): COLONOSCOPY WITH PROPOFOL (N/A) ESOPHAGOGASTRODUODENOSCOPY (EGD) WITH PROPOFOL (N/A) BIOPSY (N/A) POLYPECTOMY (N/A)  Patient Location: PACU  Anesthesia Type: MAC  Level of Consciousness: awake  Airway & Oxygen Therapy: Patient Spontanous Breathing. Nasal cannula  Post-op Assessment: Report given to PACU RN, Post -op Vital signs reviewed and stable and Patient moving all extremities  Post vital signs: Reviewed and stable  Complications: No apparent anesthesia complications

## 2013-05-12 NOTE — Anesthesia Procedure Notes (Signed)
Procedure Name: MAC Date/Time: 05/12/2013 7:40 AM Performed by: Franco Nones Pre-anesthesia Checklist: Patient identified, Emergency Drugs available, Suction available, Timeout performed and Patient being monitored Patient Re-evaluated:Patient Re-evaluated prior to inductionOxygen Delivery Method: Non-rebreather mask

## 2013-05-12 NOTE — H&P (View-Only) (Signed)
Quick Note:  Pre-op labs. Needs to see PCP about chronically low Na. Send copy to PCP. ______ 

## 2013-05-12 NOTE — Op Note (Signed)
[facility] [address] [place], [address]   ENDOSCOPY PROCEDURE REPORT  PATIENT: Timothy, [name]  MR#: [id number] BIRTHDATE: 11-21-1970 , 42  yrs. old GENDER: Male ENDOSCOPIST: R.  Roetta Sessions, MD FACP FACG REFERRED BY:  Artis Delay, M.D. PROCEDURE DATE:  05/12/2013 PROCEDURE:     EGD with gastric biopsy  INDICATIONS: right upper quadrant abdominal pain; GERD  INFORMED CONSENT:   The risks, benefits, limitations, alternatives and imponderables have been discussed.  The potential for biopsy, esophogeal dilation, etc. have also been reviewed.  Questions have been answered.  All parties agreeable.  Please see the history and physical in the medical record for more information.  MEDICATIONS:    deep sedation per Dr. [facility]  DESCRIPTION OF PROCEDURE:   The     endoscope was introduced through the mouth and advanced to the second portion of the duodenum without difficulty or limitations.  The mucosal surfaces were surveyed very carefully during advancement of the scope and upon withdrawal.  Retroflexion view of the proximal stomach and esophagogastric junction was performed.      FINDINGS: Linear distal esophageal erosions-2 columns extending up from the GE junction as much as 3 cm.  No Barrett's esophagus. Stomach empty.  Small hiatal hernia. Diffuse, fine submucosal petechiae. No ulcer or infiltrating process. Patent pylorus. Normal first and second portion of the duodenum.  THERAPEUTIC / DIAGNOSTIC MANEUVERS PERFORMED:  Biopsies of the abnormal gastric mucosa taken for histologic study   COMPLICATIONS:  None  IMPRESSION:  Erosive reflux esophagitis -  likely a major contributing factor to right upper quadrant abdominal pain. Hiatal hernia. Abnormal gastric mucosa  -   status post biopsy.  RECOMMENDATIONS:   Continue Dexilant 60 mg daily. Patient needs significant weight loss. GERD diet/information provided Followup  on pathology. See colonoscopy report.    _______________________________ R. Roetta Sessions, MD FACP [facility] eSigned:  R. Roetta Sessions, MD FACP [facility] 05/12/2013 8:42 AM     CC:  PATIENT NAME:  Timothy, [name] MR#: [id number]

## 2013-05-12 NOTE — Telephone Encounter (Signed)
I have scheduled patient for 9/24 at 2pm with RMR for a banding.

## 2013-05-12 NOTE — Anesthesia Preprocedure Evaluation (Signed)
Anesthesia Evaluation  Patient identified by MRN, date of birth, ID band Patient awake    Reviewed: Allergy & Precautions, H&P , NPO status , Patient's Chart, lab work & pertinent test results, reviewed documented beta blocker date and time   Airway Mallampati: II TM Distance: >3 FB     Dental  (+) Teeth Intact   Pulmonary Current Smoker,  breath sounds clear to auscultation        Cardiovascular hypertension, Pt. on medications and Pt. on home beta blockers + CAD + dysrhythmias (WPW s/p ablation, now occasional SVT episode w/ near syncope.) Rhythm:Regular Rate:Normal     Neuro/Psych PSYCHIATRIC DISORDERS Anxiety    GI/Hepatic GERD-  Medicated and Controlled,  Endo/Other    Renal/GU      Musculoskeletal   Abdominal   Peds  Hematology   Anesthesia Other Findings   Reproductive/Obstetrics                           Anesthesia Physical Anesthesia Plan  ASA: III  Anesthesia Plan: MAC   Post-op Pain Management:    Induction:   Airway Management Planned: Simple Face Mask  Additional Equipment:   Intra-op Plan:   Post-operative Plan:   Informed Consent: I have reviewed the patients History and Physical, chart, labs and discussed the procedure including the risks, benefits and alternatives for the proposed anesthesia with the patient or authorized representative who has indicated his/her understanding and acceptance.     Plan Discussed with:   Anesthesia Plan Comments:         Anesthesia Quick Evaluation

## 2013-05-12 NOTE — Op Note (Signed)
[facility] [address] [place], [address]   COLONOSCOPY PROCEDURE REPORT  PATIENT: [name], Timothy  MR#:         [id number] BIRTHDATE: [birth year] , 42  yrs. old GENDER: Male ENDOSCOPIST: R.  Roetta Sessions, MD FACP FACG REFERRED BY:  Artis Delay, M.D. PROCEDURE DATE:  05/12/2013 PROCEDURE:     Ileocolonoscopy with lesion ablation and snare polypectomy  INDICATIONS: Intermittent hematochezia  INFORMED CONSENT:  The risks, benefits, alternatives and imponderables including but not limited to bleeding, perforation as well as the possibility of a missed lesion have been reviewed.  The potential for biopsy, lesion removal, etc. have also been discussed.  Questions have been answered.  All parties agreeable. Please see the history and physical in the medical record for more information.  MEDICATIONS: Deep sedation per Dr. [facility]  DESCRIPTION OF PROCEDURE:  After a digital rectal exam was performed, the     colonoscope was advanced from the anus through the rectum and colon to the area of the cecum, ileocecal valve and appendiceal orifice.  The cecum was deeply intubated.  These structures were well-seen and photographed for the record.  From the level of the cecum and ileocecal valve, the scope was slowly and cautiously withdrawn.  The mucosal surfaces were carefully surveyed utilizing scope tip deflection to facilitate fold flattening as needed.  The scope was pulled down into the rectum where a thorough examination including retroflexion was performed.     FINDINGS:  Anal canal hemorrhoids. Adequate preparation. Normal rectum aside from a single diminutive polyp at 10 cm from the anal verge. (1) 5 mm polyp at the splenic flexure There was a 7 mm polyp in the mid sigmoid segment with an adjacent diminutive polyp; otherwise, the remainder of the colonic mucosa appeared normal. The distal 5 cm of terminal ileal mucosa appeared  normal.  THERAPEUTIC / DIAGNOSTIC MANEUVERS PERFORMED:  The above-mentioned polyps were hot and cold snare removed with the diminutive polyps ablated with the tip of the hot snare loop.  COMPLICATIONS: none  CECAL WITHDRAWAL TIME:  12 minutes  IMPRESSION:  Hemorrhoids (grade 3 by history)-likely source of hematochezia. Colonic and rectal polyps removed and/or treated as described above.  RECOMMENDATIONS: Followup on pathology. Patient would benefit from anoscopy and hemorrhoid banding as appropriate. We will get him set up in the near future. See  EGD report.   _______________________________ eSigned:  R. Roetta Sessions, MD FACP [facility] 05/12/2013 8:47 AM   CC:    PATIENT NAME:  Timothy [name], Timothy [name] MR#: [id number]

## 2013-05-12 NOTE — Interval H&P Note (Signed)
History and Physical Interval Note:  05/12/2013 7:27 AM  Timothy [name]  has presented today for surgery, with the diagnosis of Family History of Colon Cancer, RUQ Pain, Rectal Bleed, Change in Bowels  The various methods of treatment have been discussed with the patient and family. After consideration of risks, benefits and other options for treatment, the patient has consented to  Procedure(s) with comments: COLONOSCOPY WITH PROPOFOL (N/A) - 8:25-moved to 7:30am ESOPHAGOGASTRODUODENOSCOPY (EGD) WITH PROPOFOL (N/A) as a surgical intervention .  The patient's history has been reviewed, patient examined, no change in status, stable for surgery.  I have reviewed the patient's chart and labs.  Questions were answered to the patient's satisfaction.     Timothy [name]  No change. Describes prolapsing hemorrhoids that are reducible. EGD and colonoscopy per plan.The risks, benefits, limitations, imponderables and alternatives regarding both EGD and colonoscopy have been reviewed with the patient. Questions have been answered. All parties agreeable.

## 2013-05-12 NOTE — Anesthesia Postprocedure Evaluation (Signed)
Anesthesia Post Note  Patient: Timothy [name]  Procedure(s) Performed: Procedure(s) (LRB): COLONOSCOPY WITH PROPOFOL (N/A) ESOPHAGOGASTRODUODENOSCOPY (EGD) WITH PROPOFOL (N/A) BIOPSY (N/A) POLYPECTOMY (N/A)  Anesthesia type: MAC  Patient location: PACU  Post pain: Pain level controlled  Post assessment: Post-op Vital signs reviewed, Patient's Cardiovascular Status Stable, Respiratory Function Stable, Patent Airway, No signs of Nausea or vomiting and Pain level controlled  Last Vitals:  Filed Vitals:   05/12/13 0834  BP: 118/85  Pulse: 76  Temp: 36.5 C  Resp: 16    Post vital signs: Reviewed and stable  Level of consciousness: awake and alert   Complications: No apparent anesthesia complications

## 2013-05-12 NOTE — Interval H&P Note (Signed)
History and Physical Interval Note:  05/12/2013 7:26 AM  Timothy [name]  has presented today for surgery, with the diagnosis of Family History of Colon Cancer, RUQ Pain, Rectal Bleed, Change in Bowels  The various methods of treatment have been discussed with the patient and family. After consideration of risks, benefits and other options for treatment, the patient has consented to  Procedure(s) with comments: COLONOSCOPY WITH PROPOFOL (N/A) - 8:25-moved to 7:30am ESOPHAGOGASTRODUODENOSCOPY (EGD) WITH PROPOFOL (N/A) as a surgical intervention .  The patient's history has been reviewed, patient examined, no change in status, stable for surgery.  I have reviewed the patient's chart and labs.  Questions were answered to the patient's satisfaction.     [name]

## 2013-05-13 ENCOUNTER — Encounter (HOSPITAL_COMMUNITY): Payer: Self-pay | Admitting: Internal Medicine

## 2013-05-15 ENCOUNTER — Encounter: Payer: Self-pay | Admitting: Internal Medicine

## 2013-05-17 ENCOUNTER — Telehealth: Payer: Self-pay | Admitting: General Practice

## 2013-05-17 DIAGNOSIS — G8929 Other chronic pain: Secondary | ICD-10-CM

## 2013-05-17 NOTE — Progress Notes (Signed)
Letter mailed to pt. Please schedule HIDA

## 2013-05-17 NOTE — Progress Notes (Signed)
I called NM, no answer lmom.

## 2013-05-17 NOTE — Telephone Encounter (Signed)
I called NM to schedule Hida, no answer, lmom

## 2013-05-17 NOTE — Progress Notes (Signed)
Letter from: Corbin Ade  Reason for Letter: Results Review Send letter to patient.  Send copy of letter with path to referring provider and PCP.   Need HIDA w fatty meal or CCK to evaluate GB furt

## 2013-05-17 NOTE — Telephone Encounter (Signed)
Noted  

## 2013-05-18 NOTE — Progress Notes (Signed)
Pt is scheduled for 05/20/13@10 :00am

## 2013-05-18 NOTE — Progress Notes (Signed)
Pt is aware.  

## 2013-05-20 ENCOUNTER — Encounter (HOSPITAL_COMMUNITY)
Admission: RE | Admit: 2013-05-20 | Discharge: 2013-05-20 | Disposition: A | Discharge status: Home / Self Care | Payer: Medicare Other | Source: Ambulatory Visit | Attending: Internal Medicine | Admitting: Internal Medicine

## 2013-05-20 ENCOUNTER — Encounter (HOSPITAL_COMMUNITY): Payer: Self-pay

## 2013-05-20 DIAGNOSIS — R1011 Right upper quadrant pain: Secondary | ICD-10-CM | POA: Diagnosis not present

## 2013-05-20 DIAGNOSIS — G8929 Other chronic pain: Secondary | ICD-10-CM

## 2013-05-20 MED ORDER — TECHNETIUM TC 99M MEBROFENIN IV KIT
5.0000 | PACK | Freq: Once | INTRAVENOUS | Status: AC | PRN
  Administered 2013-05-20: 5 via INTRAVENOUS

## 2013-05-27 NOTE — Progress Notes (Unsigned)
error    This encounter was created in error - please disregard.

## 2013-05-27 NOTE — Progress Notes (Signed)
Quick Note:  Please let pt know, HIDA normal. ______

## 2013-06-01 DIAGNOSIS — Z6831 Body mass index (BMI) 31.0-31.9, adult: Secondary | ICD-10-CM | POA: Diagnosis not present

## 2013-06-01 DIAGNOSIS — G8929 Other chronic pain: Secondary | ICD-10-CM | POA: Diagnosis not present

## 2013-06-01 DIAGNOSIS — K649 Unspecified hemorrhoids: Secondary | ICD-10-CM | POA: Diagnosis not present

## 2013-06-08 ENCOUNTER — Encounter: Payer: Medicare Other | Admitting: Internal Medicine

## 2013-06-22 ENCOUNTER — Ambulatory Visit: Payer: Medicare Other | Admitting: Internal Medicine

## 2013-06-22 ENCOUNTER — Encounter: Payer: Self-pay | Admitting: Internal Medicine

## 2013-06-22 ENCOUNTER — Encounter (INDEPENDENT_AMBULATORY_CARE_PROVIDER_SITE_OTHER): Payer: Self-pay

## 2013-06-22 VITALS — BP 163/104 | HR 104 | Temp 97.4°F | Ht 72.0 in | Wt 230.2 lb

## 2013-06-22 DIAGNOSIS — K648 Other hemorrhoids: Secondary | ICD-10-CM

## 2013-06-22 HISTORY — PX: HEMORRHOID BANDING: SHX5850

## 2013-06-22 NOTE — Patient Instructions (Signed)
Continue Metamucil daily  Continue to avoid straining with having a bowel movement  Minimize the consumption of beer daily as discussed  Appointment to see [place] back in 2-3 weeks.  Please call if you have any interim problems  As far as GERD is concerned, continue Dexilant 60 mg orally daily. Can leave Carafate off.

## 2013-06-22 NOTE — Progress Notes (Signed)
[facility] banding procedure note:  The patient presents with symptomatic grade 3 hemorrhoids, unresponsive to maximal medical therapy, requesting rubber band ligation of his hemorrhoidal disease. All risks, benefits, and alternative forms of therapy were described and informed consent was obtained.  In the left lateral decubitus position anoscopic examination revealed prominent inflamed right posterior and right anterior hemorrhoidal piles. Otherwise exam negative.  The decision was made to band the Right anterior internal hemorrhoid, and the [facility] O'Regan System was used to perform band ligation without complication. Digital anorectal examination was then performed to assure proper positioning of the band, and to adjust the banded tissue as required. The band was palpated to be in appropriate position. There is no pain or pinching after placement. The patient was discharged home without pain or other issues. Dietary and behavioral recommendations were given along with follow-up instructions. The patient will return in 2-3 weeks for followup and possible additional banding as required.  No complications were encountered and the patient tolerated the procedure well.

## 2013-06-27 ENCOUNTER — Telehealth: Payer: Self-pay | Admitting: Internal Medicine

## 2013-06-27 NOTE — Telephone Encounter (Signed)
Patient had a hemorrhoid banding done here in the office of Wednesday of last week w/RMR and hes having a lot of pain and inflammation in his rectal area, please advise?

## 2013-06-27 NOTE — Telephone Encounter (Signed)
Pt pain level is a 5 all the time. His last BM was Saturday. He said that it hurt to go to the bathroom and to pass gas. He said it feel swallowing on the out side of the rectum. He had gotten into the shower to see if the warm water would help and it is not. He is having some bleeding also. He is trying to tough it out but the pain hurts to bad. Please advise.

## 2013-06-27 NOTE — Telephone Encounter (Signed)
He is taking Metamucil fiber. The pain started Friday but he does not think it is from the band. He said it feel like there is something swallowing near his rectum and it is pulling. He had some runny  BM on Saturday and it hurt. He feels like he is raw.

## 2013-06-27 NOTE — Telephone Encounter (Signed)
Pt can not afford the Rx is there anything different was can do for him. Please advise

## 2013-06-27 NOTE — Telephone Encounter (Signed)
Is possible he may have opened up a fissure since hemorrhoid banding. He had no pain whatsoever when he left on Wednesday and I agree with him, pain not likely coming from the band. Let's try nitroglycerin ointment 0.125%. Dispense 30 g. Apply pea-sized amount to the anus and rectum 4 times a day. One refill. Try and get it filled today. It will need to be compounded. Would get a 24-48 hours to settle down. Let's check on him day after tomorrow.

## 2013-06-27 NOTE — Telephone Encounter (Signed)
Per RMR there is really nothing elias to give him. I will call and check on him Wednesday to see how he is doing, he may need office visit Thursday.

## 2013-06-28 NOTE — Telephone Encounter (Signed)
Tried to call with no answer  

## 2013-06-28 NOTE — Telephone Encounter (Signed)
Let's check on him today

## 2013-06-29 DIAGNOSIS — R0989 Other specified symptoms and signs involving the circulatory and respiratory systems: Secondary | ICD-10-CM

## 2013-06-29 NOTE — Telephone Encounter (Signed)
Patient feeling a lot better with medication and warm bathes. His pain level is about a 3 now. He will continue to use the medication and do the warm bathes. He will call if he needs anything.

## 2013-06-30 DIAGNOSIS — Z6831 Body mass index (BMI) 31.0-31.9, adult: Secondary | ICD-10-CM | POA: Diagnosis not present

## 2013-06-30 DIAGNOSIS — I10 Essential (primary) hypertension: Secondary | ICD-10-CM | POA: Diagnosis not present

## 2013-06-30 DIAGNOSIS — G8929 Other chronic pain: Secondary | ICD-10-CM | POA: Diagnosis not present

## 2013-06-30 DIAGNOSIS — J029 Acute pharyngitis, unspecified: Secondary | ICD-10-CM | POA: Diagnosis not present

## 2013-07-06 ENCOUNTER — Telehealth: Payer: Self-pay

## 2013-07-06 NOTE — Telephone Encounter (Signed)
Pt is still having some pain with his rectum. He is still using the medication that we called in for him. His pain is still about a 4. I told him to call us if he needed to come in before his next appointment. Pt advise he would if needed.

## 2013-07-13 ENCOUNTER — Encounter: Payer: Self-pay | Admitting: Gastroenterology

## 2013-07-13 ENCOUNTER — Ambulatory Visit (INDEPENDENT_AMBULATORY_CARE_PROVIDER_SITE_OTHER): Payer: Medicare Other | Admitting: Gastroenterology

## 2013-07-13 ENCOUNTER — Ambulatory Visit: Payer: Medicare Other | Admitting: Gastroenterology

## 2013-07-13 VITALS — BP 140/100 | HR 98 | Temp 97.8°F | Wt 229.2 lb

## 2013-07-13 DIAGNOSIS — K649 Unspecified hemorrhoids: Secondary | ICD-10-CM

## 2013-07-13 DIAGNOSIS — K219 Gastro-esophageal reflux disease without esophagitis: Secondary | ICD-10-CM | POA: Insufficient documentation

## 2013-07-13 NOTE — Assessment & Plan Note (Signed)
S/p hemorrhoid banding on 06/22/13. Suspect he had thrombosed hemorrhoid given his description. At any rate, he has improved significantly. He has noted a marked improvement in his hemorrhoids after one banding and is interested in second banding over the next few weeks. He will call in couple of weeks and if he is doing well at that time, then consider appointment with Dr. Jena Gauss for repeat banding.

## 2013-07-13 NOTE — Patient Instructions (Signed)
1. Please call when you are ready for your next hemorrhoid banding.  2. Continue Dexilant daily for now. You can try to go to Prilosec at any time if it is more cost effective for you.

## 2013-07-13 NOTE — Assessment & Plan Note (Signed)
Doing much better since dietary changes, etoh cessation. Dexilant samples provided. If he wants to convert to omeprazole 20mg  daily in the near future, he would likely respond given significant lifestyle changes.

## 2013-07-13 NOTE — Progress Notes (Signed)
Primary Care Physician: Cassell Smiles., MD  Primary Gastroenterologist:  Roetta Sessions, MD   Chief Complaint  Patient presents with  . Follow-up  . Rectal Pain    HPI: Timothy [name] is a 42 y.o. male here all of recent hemorrhoid banding on 06/22/2013. He is a history of grade 3 hemorrhoids unresponsive to maximal medical therapy. Right anterior internal hemorrhoid was banded at that time. Patient states after he went home from the procedure he felt raw for few hours. He was fine for two days until his first BM. He states he developed hard/deep red/swollen tender area the size of a large pill just outside the anus. Nitroglycerin ointment was prescribed for ?fissure. The rectal pain has resolved. The hemorrhoid is shrunk in size. Has some itching. Only using NTG prn now. Using Witch hazel for burning. The large hemorrhoids he had prior to banding are gone.   Abdominal pain resolved. Quit all etoh and spicy foods. Dexilant daily. No heartburn. ?prilosec? Failure before, but he was drinking a lot of etoh then.     Current Outpatient Prescriptions  Medication Sig Dispense Refill  . acetaminophen (TYLENOL) 500 MG tablet Take 500-1,500 mg by mouth daily as needed for pain.      Marland Kitchen aspirin 81 MG tablet Take 81 mg by mouth daily.        Marland Kitchen CARTIA XT 120 MG 24 hr capsule Take 1 capsule by mouth daily.       . cloNIDine (CATAPRES) 0.2 MG tablet Take 1 tablet (0.2 mg total) by mouth 2 (two) times daily.  60 tablet  6  . dexlansoprazole (DEXILANT) 60 MG capsule Take 1 capsule (60 mg total) by mouth daily.  15 capsule  0  . diltiazem (CARDIZEM CD) 240 MG 24 hr capsule Take 240 mg by mouth every morning.      . furosemide (LASIX) 20 MG tablet Take 20 mg by mouth 2 (two) times daily.      . hydrochlorothiazide (HYDRODIURIL) 25 MG tablet Take 1 tablet (25 mg total) by mouth daily.  30 tablet  0  . metoprolol (TOPROL-XL) 100 MG 24 hr tablet Take 100 mg by mouth 2 (two) times daily.        . mirtazapine  (REMERON) 15 MG tablet Take 15 mg by mouth at bedtime as needed and may repeat dose one time if needed (for sleep).       . Multiple Vitamins-Minerals (MENS MULTIVITAMIN PLUS PO) Take 1 tablet by mouth daily.      Marland Kitchen oxycodone (OXY-IR) 5 MG capsule Take 5 mg by mouth every 6 (six) hours as needed. For pain. May take 3 to 4 times daily.       . potassium chloride SA (K-DUR,KLOR-CON) 20 MEQ tablet Take 40 mEq by mouth 2 (two) times daily.      . ranitidine (ZANTAC) 150 MG tablet Take 150 mg by mouth daily. OTC medication       No current facility-administered medications for this visit.    Allergies as of 07/13/2013 - Review Complete 07/13/2013  Allergen Reaction Noted  . Morphine Other (See Comments)     ROS:  General: Negative for anorexia, weight loss, fever, chills, fatigue, weakness. ENT: Negative for hoarseness, difficulty swallowing , nasal congestion. CV: Negative for chest pain, angina, palpitations, dyspnea on exertion, peripheral edema.  Respiratory: Negative for dyspnea at rest, dyspnea on exertion, cough, sputum, wheezing.  GI: See history of present illness. GU:  Negative for dysuria, hematuria, urinary incontinence,  urinary frequency, nocturnal urination.  Endo: Negative for unusual weight change.    Physical Examination:   BP 140/100  Pulse 98  Temp(Src) 97.8 F (36.6 C) (Oral)  Wt 229 lb 3.2 oz (103.964 kg)  BMI 31.08 kg/m2  General: Well-nourished, well-developed in no acute distress.  Eyes: No icterus. Mouth: Oropharyngeal mucosa moist and pink , no lesions erythema or exudate. Extremities: No lower extremity edema. No clubbing or deformities. Neuro: Alert and oriented x 4   Skin: Warm and dry, no jaundice.   Psych: Alert and cooperative, normal mood and affect.

## 2013-07-15 ENCOUNTER — Ambulatory Visit: Payer: Medicare Other | Admitting: Gastroenterology

## 2013-07-18 NOTE — Progress Notes (Signed)
cc'd to pcp 

## 2013-07-30 DIAGNOSIS — I10 Essential (primary) hypertension: Secondary | ICD-10-CM | POA: Diagnosis not present

## 2013-07-30 DIAGNOSIS — Z6830 Body mass index (BMI) 30.0-30.9, adult: Secondary | ICD-10-CM | POA: Diagnosis not present

## 2013-07-30 DIAGNOSIS — I2510 Atherosclerotic heart disease of native coronary artery without angina pectoris: Secondary | ICD-10-CM | POA: Diagnosis not present

## 2013-07-30 DIAGNOSIS — G8929 Other chronic pain: Secondary | ICD-10-CM | POA: Diagnosis not present

## 2013-08-17 DIAGNOSIS — L570 Actinic keratosis: Secondary | ICD-10-CM | POA: Diagnosis not present

## 2013-08-17 DIAGNOSIS — D485 Neoplasm of uncertain behavior of skin: Secondary | ICD-10-CM | POA: Diagnosis not present

## 2013-08-17 DIAGNOSIS — C44319 Basal cell carcinoma of skin of other parts of face: Secondary | ICD-10-CM | POA: Diagnosis not present

## 2013-08-17 DIAGNOSIS — D235 Other benign neoplasm of skin of trunk: Secondary | ICD-10-CM | POA: Diagnosis not present

## 2013-08-26 DIAGNOSIS — E785 Hyperlipidemia, unspecified: Secondary | ICD-10-CM | POA: Diagnosis not present

## 2013-08-26 DIAGNOSIS — Z681 Body mass index (BMI) 19 or less, adult: Secondary | ICD-10-CM | POA: Diagnosis not present

## 2013-08-26 DIAGNOSIS — Z0000 Encounter for general adult medical examination without abnormal findings: Secondary | ICD-10-CM | POA: Diagnosis not present

## 2013-08-26 DIAGNOSIS — G8929 Other chronic pain: Secondary | ICD-10-CM | POA: Diagnosis not present

## 2013-08-26 DIAGNOSIS — I10 Essential (primary) hypertension: Secondary | ICD-10-CM | POA: Diagnosis not present

## 2013-09-16 ENCOUNTER — Encounter: Payer: Medicare Other | Admitting: Cardiology

## 2013-09-16 NOTE — Progress Notes (Signed)
     ERROR No Show  

## 2013-09-22 ENCOUNTER — Encounter: Payer: Self-pay | Admitting: Cardiology

## 2013-09-27 DIAGNOSIS — Z6830 Body mass index (BMI) 30.0-30.9, adult: Secondary | ICD-10-CM | POA: Diagnosis not present

## 2013-09-27 DIAGNOSIS — G8929 Other chronic pain: Secondary | ICD-10-CM | POA: Diagnosis not present

## 2013-10-27 DIAGNOSIS — Z6831 Body mass index (BMI) 31.0-31.9, adult: Secondary | ICD-10-CM | POA: Diagnosis not present

## 2013-10-27 DIAGNOSIS — G8929 Other chronic pain: Secondary | ICD-10-CM | POA: Diagnosis not present

## 2013-10-27 DIAGNOSIS — J984 Other disorders of lung: Secondary | ICD-10-CM | POA: Diagnosis not present

## 2014-01-19 DIAGNOSIS — G4700 Insomnia, unspecified: Secondary | ICD-10-CM | POA: Diagnosis not present

## 2014-01-19 DIAGNOSIS — Z6831 Body mass index (BMI) 31.0-31.9, adult: Secondary | ICD-10-CM | POA: Diagnosis not present

## 2014-01-19 DIAGNOSIS — G8929 Other chronic pain: Secondary | ICD-10-CM | POA: Diagnosis not present

## 2014-02-15 DIAGNOSIS — R109 Unspecified abdominal pain: Secondary | ICD-10-CM | POA: Diagnosis not present

## 2014-02-15 DIAGNOSIS — N200 Calculus of kidney: Secondary | ICD-10-CM | POA: Diagnosis not present

## 2014-02-15 DIAGNOSIS — Z6830 Body mass index (BMI) 30.0-30.9, adult: Secondary | ICD-10-CM | POA: Diagnosis not present

## 2014-03-23 ENCOUNTER — Other Ambulatory Visit: Payer: Self-pay | Admitting: Cardiology

## 2014-04-19 DIAGNOSIS — I10 Essential (primary) hypertension: Secondary | ICD-10-CM | POA: Diagnosis not present

## 2014-04-19 DIAGNOSIS — Z6831 Body mass index (BMI) 31.0-31.9, adult: Secondary | ICD-10-CM | POA: Diagnosis not present

## 2014-04-19 DIAGNOSIS — Z719 Counseling, unspecified: Secondary | ICD-10-CM | POA: Diagnosis not present

## 2014-04-19 DIAGNOSIS — G8929 Other chronic pain: Secondary | ICD-10-CM | POA: Diagnosis not present

## 2014-04-21 ENCOUNTER — Telehealth: Payer: Self-pay | Admitting: Cardiology

## 2014-04-21 NOTE — Telephone Encounter (Signed)
LMTCB Pt already has an 05/03/14 appointment scheduled with Dr. Harl Bowie in our [place] office. Horton Chin RN

## 2014-04-21 NOTE — Telephone Encounter (Signed)
New message           Pt bp has been high and would like to see dr Aundra Dubin within the next 2 weeks / nothing with NP or PA / Can you work pt in

## 2014-04-28 NOTE — Telephone Encounter (Signed)
Left message for patient to call to confirm appointment

## 2014-05-03 ENCOUNTER — Ambulatory Visit (INDEPENDENT_AMBULATORY_CARE_PROVIDER_SITE_OTHER): Payer: Medicare Other | Admitting: Cardiology

## 2014-05-03 ENCOUNTER — Encounter: Payer: Self-pay | Admitting: Cardiology

## 2014-05-03 VITALS — BP 123/83 | HR 70 | Ht 72.0 in | Wt 229.0 lb

## 2014-05-03 DIAGNOSIS — R002 Palpitations: Secondary | ICD-10-CM | POA: Diagnosis not present

## 2014-05-03 DIAGNOSIS — I10 Essential (primary) hypertension: Secondary | ICD-10-CM

## 2014-05-03 MED ORDER — AMLODIPINE BESYLATE 5 MG PO TABS: 5.0000 mg | ORAL_TABLET | Freq: Every day | ORAL | Status: DC

## 2014-05-03 NOTE — Progress Notes (Signed)
Clinical Summary Mr. Meloche is a 43 y.o.male seen today for follow up of the following medical problems.   1. 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 Palpitations  - has had 3 ablations in the past at [facility] ([place], 2001) for WPW  - from notes has has continued problems with palpitations for several years. Apparently had loop recorder through [facility] for 2 years without any significant arrhythmias. Event monitor 10/2010 without any significant findings other than occasional PVCs. Appears there was a monitor ordered 02/2013 after a visit with Dr Marigene Ehlers that was never competed.  - today he continues to report palpitations at times. Sporadic in frequency, can not occur for months and then come on and last several days. Feeling of heart thumping with associated fatigue. Can last for a few minutes to up to 2 day consistently.  - no coffee, no energy drinks. 2 sodas daily. - compliant with meds, though he is unsure of his dilt dose. He asks to come off of dilt because it has never worked before and is expensive.    2. HTN - bp's run 150s/90s by his home checks - reports tried on lisinopril previously, had some form of side effect he is unsure of  3. OSA - severe OSA by prior study in 2009 - he has not wanted to wear a CPAP   Past Medical History  Diagnosis Date  . Tachycardia   . Palpitations   . Syncope   . Anxiety   . HTN (hypertension)   . Wolff-Parkinson-White (WPW) syndrome     says was cured with ablation  . Central sleep apnea   . Tobacco abuse   . CAD (coronary artery disease) 2007    50% stenosis small diagonal  . Chronic pain     since 1998 has been on chronic pain medication  . GERD (gastroesophageal reflux disease)   . Arthritis   . Tubular adenoma   . Hemorrhoids      Allergies  Allergen Reactions  . Morphine Other (See Comments)    Makes overly sleepy     Current Outpatient Prescriptions    Medication Sig Dispense Refill  . acetaminophen (TYLENOL) 500 MG tablet Take 500-1,500 mg by mouth daily as needed for pain.      Marland Kitchen aspirin 81 MG tablet Take 81 mg by mouth daily.        Marland Kitchen CARTIA XT 120 MG 24 hr capsule Take 1 capsule by mouth daily.       . cloNIDine (CATAPRES) 0.2 MG tablet Take 1 tablet (0.2 mg total) by mouth 2 (two) times daily.  60 tablet  6  . cloNIDine (CATAPRES) 0.2 MG tablet TAKE 1 TABLET BY MOUTH TWICE DAILY  14 tablet  0  . dexlansoprazole (DEXILANT) 60 MG capsule Take 1 capsule (60 mg total) by mouth daily.  15 capsule  0  . diltiazem (CARDIZEM CD) 240 MG 24 hr capsule Take 240 mg by mouth every morning.      . furosemide (LASIX) 20 MG tablet Take 20 mg by mouth 2 (two) times daily.      . hydrochlorothiazide (HYDRODIURIL) 25 MG tablet Take 1 tablet (25 mg total) by mouth daily.  30 tablet  0  . metoprolol (TOPROL-XL) 100 MG 24 hr tablet Take 100 mg by mouth 2 (two) times daily.        . mirtazapine (REMERON) 15 MG tablet  Take 15 mg by mouth at bedtime as needed and may repeat dose one time if needed (for sleep).       . Multiple Vitamins-Minerals (MENS MULTIVITAMIN PLUS PO) Take 1 tablet by mouth daily.      Marland Kitchen oxycodone (OXY-IR) 5 MG capsule Take 5 mg by mouth every 6 (six) hours as needed. For pain. May take 3 to 4 times daily.       . potassium chloride SA (K-DUR,KLOR-CON) 20 MEQ tablet Take 40 mEq by mouth 2 (two) times daily.      . ranitidine (ZANTAC) 150 MG tablet Take 150 mg by mouth daily. OTC medication       No current facility-administered medications for this visit.     Past Surgical History  Procedure Laterality Date  . Atrial ablation surgery    . Appendectomy    . Adenoidectomy    . Hand surgery    . Shoulder surgery    . Colonoscopy      age 8  . Av node ablation    . Cardiac electrophysiology study and ablation      SA node ablation  . Colonoscopy with propofol N/A 05/12/2013    Dr. Gala Romney- hemorrhoids, tubular adenoma  .  Esophagogastroduodenoscopy (egd) with propofol N/A 05/12/2013    Dr. Gala Romney- hiatal hernia, erosive reflux esophagitis  . Esophageal biopsy N/A 05/12/2013    Procedure: BIOPSY;  Surgeon: Daneil Dolin, MD;  Location: [facility];  Service: Endoscopy;  Laterality: N/A;  . Polypectomy N/A 05/12/2013    Procedure: POLYPECTOMY;  Surgeon: Daneil Dolin, MD;  Location: [facility];  Service: Endoscopy;  Laterality: N/A;  . Hemorrhoid banding  06/22/13     Allergies  Allergen Reactions  . Morphine Other (See Comments)    Makes overly sleepy      Family History  Problem Relation Age of Onset  . Coronary artery disease Father     cabg  . Breast cancer Mother   . Colon cancer Father     age 76, chemo/surgery     Social History Mr. Crail reports that he has been smoking Cigarettes.  He has a 25 pack-year smoking history. He does not have any smokeless tobacco history on file. Mr. Ellenwood reports that he drinks about 1.2 ounces of alcohol per week.   Review of Systems CONSTITUTIONAL: No weight loss, fever, chills, weakness or fatigue.  HEENT: Eyes: No visual loss, blurred vision, double vision or yellow sclerae.No hearing loss, sneezing, congestion, runny nose or sore throat.  SKIN: No rash or itching.  CARDIOVASCULAR: per HPI RESPIRATORY: No shortness of breath, cough or sputum.  GASTROINTESTINAL: No anorexia, nausea, vomiting or diarrhea. No abdominal pain or blood.  GENITOURINARY: No burning on urination, no polyuria NEUROLOGICAL: No headache, dizziness, syncope, paralysis, ataxia, numbness or tingling in the extremities. No change in bowel or bladder control.  MUSCULOSKELETAL: No muscle, back pain, joint pain or stiffness.  LYMPHATICS: No enlarged nodes. No history of splenectomy.  PSYCHIATRIC: No history of depression or anxiety.  ENDOCRINOLOGIC: No reports of sweating, cold or heat intolerance. No polyuria or polydipsia.  Marland Kitchen   Physical Examination p 70 bp 123/83 Wt 229 lbs BMI 31 Gen:  resting comfortably, no acute distress HEENT: no scleral icterus, pupils equal round and reactive, no palptable cervical adenopathy,  CV: RRR, no m/r/g, no JVD, no carotid bruits Resp: Clear to auscultation bilaterally GI: abdomen is soft, non-tender, non-distended, normal bowel sounds, no hepatosplenomegaly MSK: extremities are warm, no edema.  Skin: warm, no rash Neuro:  no focal deficits Psych: appropriate affect   Diagnostic Studies 02/2013 Echo  LVEF 55-60%, no WMAs,   04/2006 Cath HEMODYNAMIC RESULTS: Aorta 102/77 mmHg. Left ventricle 102/3 mmHg.  ANGIOGRAPHIC FINDINGS:  1. Left main coronary artery is relatively long and gives rise to the left  anterior descending and circumflex coronary arteries. There is no  significant flow-limiting coronary atherosclerosis noted.  2. Left anterior descending is a medium caliber vessel extending to the  apex. Septal perforators are noted in the midvessel segment and prior  to this very proximally are two diagonal branches. No significant  flow-limiting disease is noted within the left anterior descending.  Within the first diagonal there is a bifurcation and in the inferior  subbranch is a 50% stenosis. This vessel size is less than 2 mm.  3. The circumflex coronary artery is a medium to large caliber vessel with  three obtuse marginal branches. There is no significant flow-limiting  coronary atherosclerosis noted.  4. The right coronary artery is a dominant vessel that is medium to large  in caliber. There are minor luminal irregularities including  approximately 20% stenosis in the posterolateral system.  Left ventriculography was performed in the RAO projection and reveals an  ejection fraction approximately 50-55% without focal wall motion abnormality  and with no significant mitral regurgitation.  Diagnoses  1. Mild Nargis Abrams vessel coronary atherosclerosis with no major flow-limiting  disease in the larger epicardial vessels.  2.  Left ventricular ejection fraction of approximately 50-55% with no  significant mitral regurgitation and a left ventricular end-diastolic  pressure of 3 mmHg.       Assessment and Plan  1. CAD - mild non-obstructive disease by prior cath - no current symptoms  2. Palpitations - prior ablations several years ago for WPW - long history of ongoing palpitations, monitoring over the years has not detected significant arrhythmia - he does not want repeat monitoring. Also asks to stop dilt due to ineffectiveness and cost - will stop dilt - talked in detail that his severe OSA could be causing these symptoms, he remains reluctant for CPAP but will think about it  3. HTN - normal today in clinic, reports his home numbers are elevated - will start norvasc 5mg  daily, he reports prior reaction to ACE-I though not sure what type - also counseled on possible association with his severe OSA, continues to not want CPAP - he is to keep bp log for 2 weeks and call with results and bring bp cuff to next visit  4. OSA - severe by study in 2009 - he is to consider CPAP, will let us know at follow up   F/u 3 months     Arnoldo Lenis, M.D., F.A.C.C.

## 2014-05-03 NOTE — Patient Instructions (Signed)
   Stop Diltiazem  Begin Norvasc 5mg  daily - new sent to pharm Continue all other medications.   Your physician has requested that you regularly monitor and record your blood pressure readings at home. Please take readings approximately 2 hours after medication x 2 weeks & return to office for MD review.   Call office in 3 weeks with update Follow up in  3 months - bring BP cuff to visit at that time

## 2014-07-18 DIAGNOSIS — I10 Essential (primary) hypertension: Secondary | ICD-10-CM | POA: Diagnosis not present

## 2014-07-18 DIAGNOSIS — Z6830 Body mass index (BMI) 30.0-30.9, adult: Secondary | ICD-10-CM | POA: Diagnosis not present

## 2014-07-18 DIAGNOSIS — G894 Chronic pain syndrome: Secondary | ICD-10-CM | POA: Diagnosis not present

## 2014-08-23 DIAGNOSIS — I10 Essential (primary) hypertension: Secondary | ICD-10-CM | POA: Diagnosis not present

## 2014-08-23 DIAGNOSIS — E663 Overweight: Secondary | ICD-10-CM | POA: Diagnosis not present

## 2014-08-23 DIAGNOSIS — Z6829 Body mass index (BMI) 29.0-29.9, adult: Secondary | ICD-10-CM | POA: Diagnosis not present

## 2014-08-23 DIAGNOSIS — G894 Chronic pain syndrome: Secondary | ICD-10-CM | POA: Diagnosis not present

## 2014-08-23 DIAGNOSIS — E782 Mixed hyperlipidemia: Secondary | ICD-10-CM | POA: Diagnosis not present

## 2014-08-25 ENCOUNTER — Telehealth (HOSPITAL_COMMUNITY): Payer: Self-pay | Admitting: Hematology & Oncology

## 2014-08-25 NOTE — Telephone Encounter (Signed)
CALLED PT TO MAKE HIM AWARE OF APPT TIME AND DT 09/19/14 @ 3:30

## 2014-08-27 IMAGING — DX DG CHEST 1V PORT
1 series · 1 of 1 positions shown · non-contrast
Comparison: 05/30/2011

CLINICAL DATA: Tachycardia

PORTABLE CHEST - 1 VIEW

[portable]
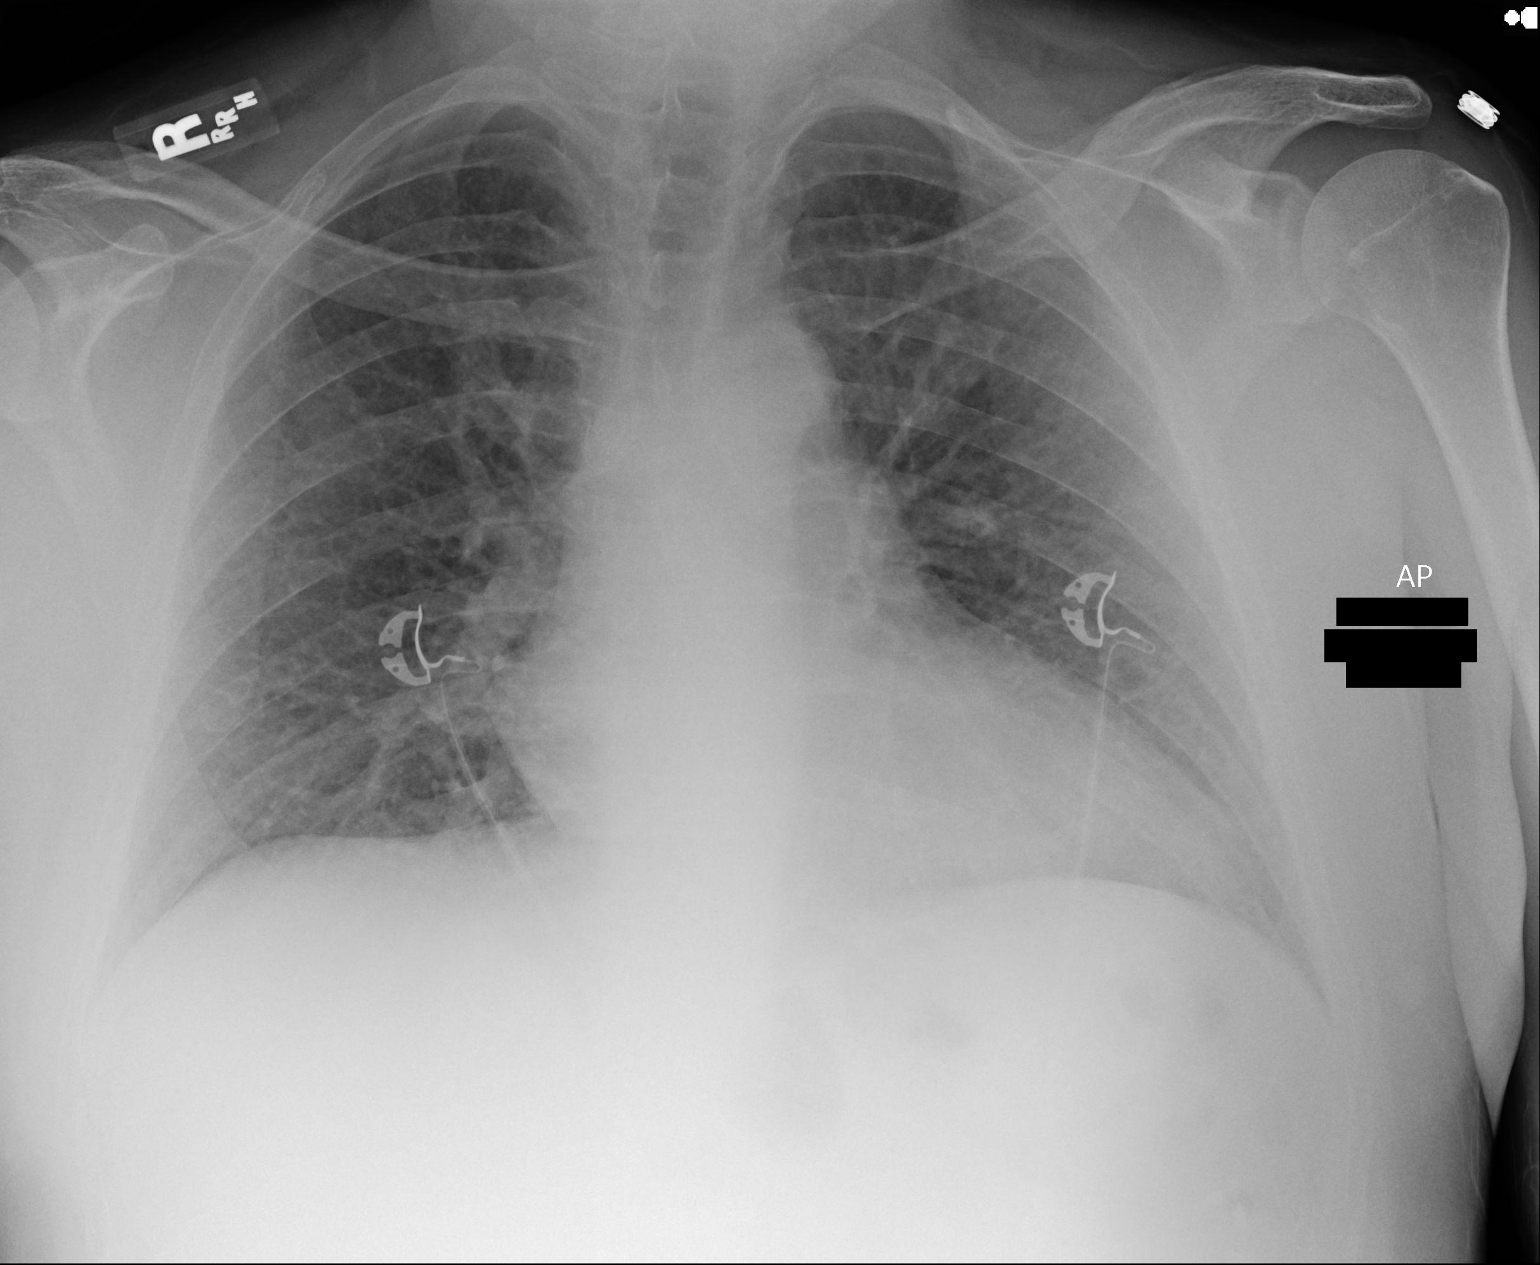

[1 of 1 positions shown; findings below may reference images not displayed]

FINDINGS: Lungs are under aerated and grossly clear.  Borderline
cardiomegaly.  No pneumothorax or pleural effusion.
IMPRESSION: No active cardiopulmonary disease.

## 2014-09-18 NOTE — Progress Notes (Signed)
[place] CONSULT NOTE  Patient Care Team: Redmond School, MD as PCP - General (Internal Medicine) Daneil Dolin, MD as Consulting Physician (Gastroenterology)  CHIEF COMPLAINTS/PURPOSE OF CONSULTATION:  Polycythemia  HISTORY OF PRESENTING ILLNESS:  Timothy [name] 44 y.o. male is here because of polycythemia.  He notes increasing headaches over the last few months but attributes it to his blood pressure. His blood pressure has been elevated since February of 2013.  He has undergone an extensive evaluation including renal artery evaluation here at [facility]. He currently takes 5 medications for his blood pressure. He denies visual changes, focal neurologic symptoms or pruritis. He no longer smokes 1 ppd and has not in over 2 years. At most he smokes 1/2 ppd.   He has a history of WPW, mild CAD, and severe OSA. Per reports he does not wear a CPAP.  MEDICAL HISTORY:  Past Medical History  Diagnosis Date  . Tachycardia   . Palpitations   . Syncope   . Anxiety   . HTN (hypertension)   . Wolff-Parkinson-White (WPW) syndrome     says was cured with ablation  . Central sleep apnea   . Tobacco abuse   . CAD (coronary artery disease) 2007    50% stenosis small diagonal  . Chronic pain     since 1998 has been on chronic pain medication  . GERD (gastroesophageal reflux disease)   . Arthritis   . Tubular adenoma   . Hemorrhoids   . Hepatomegaly 10/03/2014    SURGICAL HISTORY: Past Surgical History  Procedure Laterality Date  . Atrial ablation surgery    . Appendectomy    . Adenoidectomy    . Hand surgery    . Shoulder surgery    . Colonoscopy      age 55  . Av node ablation    . Cardiac electrophysiology study and ablation      SA node ablation  . Colonoscopy with propofol N/A 05/12/2013    Dr. Gala Romney- hemorrhoids, tubular adenoma  . Esophagogastroduodenoscopy (egd) with propofol N/A 05/12/2013    Dr. Gala Romney- hiatal hernia, erosive reflux esophagitis  .  Esophageal biopsy N/A 05/12/2013    Procedure: BIOPSY;  Surgeon: Daneil Dolin, MD;  Location: [facility];  Service: Endoscopy;  Laterality: N/A;  . Polypectomy N/A 05/12/2013    Procedure: POLYPECTOMY;  Surgeon: Daneil Dolin, MD;  Location: [facility];  Service: Endoscopy;  Laterality: N/A;  . Hemorrhoid banding  06/22/13    SOCIAL HISTORY: History   Social History  . Marital Status: Married    Spouse Name: N/A    Number of Children: 2  . Years of Education: N/A   Occupational History  . unemployed    Social History Main Topics  . Smoking status: Current Every Day Smoker -- 0.50 packs/day for 25 years    Types: Cigarettes  . Smokeless tobacco: Not on file  . Alcohol Use: 1.2 oz/week    2 Cans of beer per week     Comment: daily  . Drug Use: No  . Sexual Activity: Yes    Birth Control/ Protection: None   Other Topics Concern  . Not on file   Social History Narrative  Smokes 1/2 ppd, has not smoked 1 ppd in 2 years 3 children Currently working as a Chief Strategy Officer with [employer]: Family History  Problem Relation Age of Onset  . Coronary artery disease Father     cabg  . Breast  cancer Mother   . Colon cancer Father     age 44, chemo/surgery   Mother 34 healthy history of breast cancer Father 90 healthy  Heart problems 4 sisters, all have some form of heart "issues"   ALLERGIES:  is allergic to morphine.  MEDICATIONS:  Current Outpatient Prescriptions  Medication Sig Dispense Refill  . acetaminophen (TYLENOL) 500 MG tablet Take 500-1,500 mg by mouth daily as needed for pain.    Marland Kitchen amLODipine (NORVASC) 5 MG tablet Take 1 tablet (5 mg total) by mouth daily. 30 tablet 6  . aspirin 81 MG tablet Take 81 mg by mouth daily.      . cloNIDine (CATAPRES) 0.3 MG tablet Take 0.3 mg by mouth 2 (two) times daily.    . furosemide (LASIX) 20 MG tablet Take 20 mg by mouth 2 (two) times daily.    . hydrochlorothiazide (HYDRODIURIL) 25 MG tablet Take 1 tablet (25 mg total) by  mouth daily. 30 tablet 0  . metoprolol (TOPROL-XL) 100 MG 24 hr tablet Take 100 mg by mouth 2 (two) times daily.      . mirtazapine (REMERON) 15 MG tablet Take 15 mg by mouth at bedtime as needed and may repeat dose one time if needed (for sleep).     . Multiple Vitamins-Minerals (MENS MULTIVITAMIN PLUS PO) Take 1 tablet by mouth daily.    . Oxycodone HCl 20 MG TABS 20 mg every 6 (six) hours as needed.  0  . potassium chloride SA (K-DUR,KLOR-CON) 20 MEQ tablet Take 40 mEq by mouth 2 (two) times daily.    . ranitidine (ZANTAC) 150 MG tablet Take 150 mg by mouth as needed. OTC medication     Current Facility-Administered Medications  Medication Dose Route Frequency Provider Last Rate Last Dose  . Influenza vac split quadrivalent PF (FLUARIX) injection 0.5 mL  0.5 mL Intramuscular Once Molli Hazard, MD        Review of Systems  Constitutional: Positive for malaise/fatigue. Negative for fever, chills, weight loss and diaphoresis.  HENT: Negative for congestion, ear discharge, ear pain, hearing loss, nosebleeds, sore throat and tinnitus.        Headaches 2 x per week, new Does not snore, feels rested in am, does not fall asleep easily during the day  Eyes: Negative.   Respiratory: Negative.  Negative for stridor.   Cardiovascular: Positive for palpitations. Negative for chest pain, orthopnea, claudication, leg swelling and PND.       Known cardiac issues  Gastrointestinal: Negative.   Genitourinary:       Slow stream , hard to start stream  Musculoskeletal: Positive for back pain. Negative for myalgias, joint pain, falls and neck pain.       Bulging discs  Skin: Negative.   Neurological: Positive for headaches. Negative for dizziness, tingling, tremors, sensory change, speech change, focal weakness, seizures, loss of consciousness and weakness.  Endo/Heme/Allergies: Negative.   Psychiatric/Behavioral: Negative.     PHYSICAL EXAMINATION:  ECOG PERFORMANCE STATUS: 0 -  Asymptomatic  Filed Vitals:   09/19/14 1540  BP: 179/110  Pulse: 110  Temp: 97.6 F (36.4 C)  Resp: 20   Filed Weights   09/19/14 1540  Weight: 218 lb 8 oz (99.111 kg)     Physical Exam  Constitutional: He is oriented to person, place, and time and well-developed, well-nourished, and in no distress.  HENT:  Head: Normocephalic and atraumatic.  Nose: Nose normal.  Mouth/Throat: Oropharynx is clear and moist. No oropharyngeal exudate.  Eyes: Conjunctivae and EOM are normal. Pupils are equal, round, and reactive to light. Right eye exhibits no discharge. Left eye exhibits no discharge. No scleral icterus.  Neck: Normal range of motion. Neck supple. No tracheal deviation present. No thyromegaly present.  Cardiovascular: Normal rate, regular rhythm and normal heart sounds.  Exam reveals no gallop and no friction rub.   No murmur heard. Pulmonary/Chest: Effort normal and breath sounds normal. He has no wheezes. He has no rales.  Abdominal: Soft. Bowel sounds are normal. He exhibits no distension and no mass. There is no tenderness. There is no rebound and no guarding.  Musculoskeletal: Normal range of motion. He exhibits no edema.  Lymphadenopathy:    He has no cervical adenopathy.  Neurological: He is alert and oriented to person, place, and time. He has normal reflexes. No cranial nerve deficit. Gait normal. Coordination normal.  Skin: Skin is warm and dry. No rash noted.  Psychiatric: Mood, memory, affect and judgment normal.  Nursing note and vitals reviewed.    LABORATORY DATA:  I have reviewed the data as listed Lab Results  Component Value Date   WBC 9.6 09/19/2014   HGB 19.1* 09/19/2014   HCT 54.8* 09/19/2014   MCV 90.4 09/19/2014   PLT 337 09/19/2014     Chemistry      Component Value Date/Time   NA 134* 10/03/2014 1450   K 4.0 10/03/2014 1450   CL 103 10/03/2014 1450   CO2 25 10/03/2014 1450   BUN 9 10/03/2014 1450   CREATININE 0.87 10/03/2014 1450       Component Value Date/Time   CALCIUM 8.9 10/03/2014 1450   ALKPHOS 147* 09/19/2014 1633   AST 36 09/19/2014 1633   ALT 42 09/19/2014 1633   BILITOT 1.6* 09/19/2014 1633        ASSESSMENT & PLAN:  Polycythemia 44 year old male who presents for evaluation of polycythemia. Past medical history is remarkable for hypertension which has been difficult to control. He has by history severe obstructive sleep apnea but does not wear CPAP. He is a smoker currently only a half pack per day. He previously smoked more than a pack per day. 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 Vera. I will check a JAK 2 mutation today, B12 level, ferritin, CBC and rule out other causes of secondary polycythemia by checking an erythropoietin level as well. I will bring him back for phlebotomy if needed.   Even if he has secondary polycythemia there is evidence these patients should maintain a hematocrit less than 50, ideal hematocrit is uncertain. I discussed these issues with him today and advised him once all his laboratory studies are performed we will bring him back to discuss additional recommendations.    Orders Placed This Encounter  Procedures  . Bcr/abl gene rearrangement qnt, PCR    Standing Status: Future     Number of Occurrences: 1     Standing Expiration Date: 09/19/2015  . JAK2 genotypr    Standing Status: Future     Number of Occurrences: 1     Standing Expiration Date: 09/19/2015  . Ferritin    Standing Status: Future     Number of Occurrences: 1     Standing Expiration Date: 09/19/2015  . Iron and TIBC    Standing Status: Future     Number of Occurrences: 1     Standing Expiration Date: 09/19/2015  . Vitamin B12  Standing Status: Future     Number of Occurrences: 1     Standing Expiration Date: 09/19/2015  . CBC with Differential    Standing Status: Future     Number of Occurrences: 1     Standing Expiration  Date: 09/19/2015  . Comprehensive metabolic panel    Standing Status: Future     Number of Occurrences: 1     Standing Expiration Date: 09/19/2015  . Erythropoietin    Standing Status: Future     Number of Occurrences: 1     Standing Expiration Date: 09/19/2015  . Testosterone    Standing Status: Future     Number of Occurrences: 1     Standing Expiration Date: 09/19/2015  . Testosterone, free    Standing Status: Future     Number of Occurrences: 1     Standing Expiration Date: 09/19/2015  . TSH    Standing Status: Future     Number of Occurrences: 1     Standing Expiration Date: 09/19/2015  . Blood gas, arterial    Carboxyhemoglobin  . Miscellaneous test  . Testosterone  . Sex hormone binding globulin  . Testosterone, free  . Testosterone, % free  . P190 BCR-ABL 1  . P210 BCR-ABL 1    All questions were answered. The patient knows to call the clinic with any problems, questions or concerns.    Molli Hazard, MD MD 10/10/2014 7:18 AM

## 2014-09-19 ENCOUNTER — Encounter (HOSPITAL_COMMUNITY): Payer: Medicare Other | Attending: Hematology & Oncology | Admitting: Hematology & Oncology

## 2014-09-19 ENCOUNTER — Encounter (HOSPITAL_COMMUNITY): Payer: Self-pay | Admitting: Hematology & Oncology

## 2014-09-19 VITALS — BP 179/110 | HR 110 | Temp 97.6°F | Resp 20 | Ht 72.0 in | Wt 218.5 lb

## 2014-09-19 DIAGNOSIS — K219 Gastro-esophageal reflux disease without esophagitis: Secondary | ICD-10-CM | POA: Diagnosis not present

## 2014-09-19 DIAGNOSIS — I10 Essential (primary) hypertension: Secondary | ICD-10-CM | POA: Diagnosis not present

## 2014-09-19 DIAGNOSIS — G4733 Obstructive sleep apnea (adult) (pediatric): Secondary | ICD-10-CM | POA: Diagnosis not present

## 2014-09-19 DIAGNOSIS — D751 Secondary polycythemia: Secondary | ICD-10-CM | POA: Diagnosis not present

## 2014-09-19 DIAGNOSIS — K760 Fatty (change of) liver, not elsewhere classified: Secondary | ICD-10-CM | POA: Diagnosis not present

## 2014-09-19 DIAGNOSIS — Z720 Tobacco use: Secondary | ICD-10-CM

## 2014-09-19 DIAGNOSIS — Z7982 Long term (current) use of aspirin: Secondary | ICD-10-CM | POA: Insufficient documentation

## 2014-09-19 DIAGNOSIS — R160 Hepatomegaly, not elsewhere classified: Secondary | ICD-10-CM | POA: Diagnosis not present

## 2014-09-19 DIAGNOSIS — I2510 Atherosclerotic heart disease of native coronary artery without angina pectoris: Secondary | ICD-10-CM | POA: Diagnosis not present

## 2014-09-19 DIAGNOSIS — F17210 Nicotine dependence, cigarettes, uncomplicated: Secondary | ICD-10-CM | POA: Diagnosis not present

## 2014-09-19 DIAGNOSIS — D72829 Elevated white blood cell count, unspecified: Secondary | ICD-10-CM

## 2014-09-19 LAB — CBC WITH DIFFERENTIAL/PLATELET
BASOS PCT: 0 % (ref 0–1)
Basophils Absolute: 0 10*3/uL (ref 0.0–0.1)
EOS ABS: 0.1 10*3/uL (ref 0.0–0.7)
Eosinophils Relative: 1 % (ref 0–5)
HEMATOCRIT: 54.8 % — AB (ref 39.0–52.0)
HEMOGLOBIN: 19.1 g/dL — AB (ref 13.0–17.0)
LYMPHS PCT: 29 % (ref 12–46)
Lymphs Abs: 2.8 10*3/uL (ref 0.7–4.0)
MCH: 31.5 pg (ref 26.0–34.0)
MCHC: 34.9 g/dL (ref 30.0–36.0)
MCV: 90.4 fL (ref 78.0–100.0)
MONOS PCT: 8 % (ref 3–12)
Monocytes Absolute: 0.7 10*3/uL (ref 0.1–1.0)
NEUTROS ABS: 5.9 10*3/uL (ref 1.7–7.7)
NEUTROS PCT: 62 % (ref 43–77)
Platelets: 337 10*3/uL (ref 150–400)
RBC: 6.06 MIL/uL — AB (ref 4.22–5.81)
RDW: 13.7 % (ref 11.5–15.5)
WBC: 9.6 10*3/uL (ref 4.0–10.5)

## 2014-09-19 LAB — COMPREHENSIVE METABOLIC PANEL
ALK PHOS: 147 U/L — AB (ref 39–117)
ALT: 42 U/L (ref 0–53)
AST: 36 U/L (ref 0–37)
Albumin: 4.5 g/dL (ref 3.5–5.2)
Anion gap: 13 (ref 5–15)
BUN: 11 mg/dL (ref 6–23)
CALCIUM: 9.5 mg/dL (ref 8.4–10.5)
CHLORIDE: 95 meq/L — AB (ref 96–112)
CO2: 25 mmol/L (ref 19–32)
CREATININE: 0.86 mg/dL (ref 0.50–1.35)
GFR calc Af Amer: >90 mL/min (ref >90)
Glucose, Bld: 93 mg/dL (ref 70–99)
Potassium: 2.9 mmol/L — ABNORMAL LOW (ref 3.5–5.1)
Sodium: 133 mmol/L — ABNORMAL LOW (ref 135–145)
TOTAL PROTEIN: 8.1 g/dL (ref 6.0–8.3)
Total Bilirubin: 1.6 mg/dL — ABNORMAL HIGH (ref 0.3–1.2)

## 2014-09-19 LAB — FERRITIN: Ferritin: 112 ng/mL (ref 22–322)

## 2014-09-19 LAB — BLOOD GAS, ARTERIAL
ACID-BASE DEFICIT: 3.7 mmol/L — AB (ref 0.0–2.0)
Acid-Base Excess: 3.1 mmol/L — ABNORMAL HIGH (ref 0.0–2.0)
BICARBONATE: 26.5 meq/L — AB (ref 20.0–24.0)
Drawn by: 25788
O2 SAT: 96.6 %
TCO2: 21.2 mmol/L (ref 0–100)
pCO2 arterial: 36.2 mmHg (ref 35.0–45.0)
pH, Arterial: 7.478 — ABNORMAL HIGH (ref 7.350–7.450)
pO2, Arterial: 81.4 mmHg (ref 80.0–100.0)

## 2014-09-19 LAB — IRON AND TIBC
Iron: 147 ug/dL — ABNORMAL HIGH (ref 42–165)
SATURATION RATIOS: 36 % (ref 20–55)
TIBC: 411 ug/dL (ref 215–435)
UIBC: 264 ug/dL (ref 125–400)

## 2014-09-19 LAB — VITAMIN B12: Vitamin B-12: 725 pg/mL (ref 211–911)

## 2014-09-19 LAB — TSH: TSH: 1.252 u[IU]/mL (ref 0.350–4.500)

## 2014-09-19 MED ORDER — INFLUENZA VAC SPLIT QUAD 0.5 ML IM SUSY: 0.5000 mL | PREFILLED_SYRINGE | Freq: Once | INTRAMUSCULAR | Status: DC

## 2014-09-19 NOTE — Patient Instructions (Signed)
[place] Discharge Instructions  RECOMMENDATIONS MADE BY THE CONSULTANT AND ANY TEST RESULTS WILL BE SENT TO YOUR REFERRING PHYSICIAN.  I will see you back after your blood work.  We can phlebotomize here (take off blood) we will call you to set that up. Please call prior to your next visit if you have any questions or concerns.  Thank you for choosing [facility] to provide your oncology and hematology care.  To afford each patient quality time with our providers, please arrive at least 15 minutes before your scheduled appointment time.  With your help, our goal is to use those 15 minutes to complete the necessary work-up to ensure our physicians have the information they need to help with your evaluation and healthcare recommendations.    Effective January 1st, 2014, we ask that you re-schedule your appointment with our physicians should you arrive 10 or more minutes late for your appointment.  We strive to give you quality time with our providers, and arriving late affects you and other patients whose appointments are after yours.    Again, thank you for choosing [facility].  Our hope is that these requests will decrease the amount of time that you wait before being seen by our physicians.       _____________________________________________________________  Should you have questions after your visit to [facility], please contact our office at [phone] between the hours of 8:30 a.m. and 5:00 p.m.  Voicemails left after 4:30 p.m. will not be returned until the following business day.  For prescription refill requests, have your pharmacy contact our office with your prescription refill request.

## 2014-09-20 ENCOUNTER — Other Ambulatory Visit (HOSPITAL_COMMUNITY): Payer: Self-pay | Admitting: *Deleted

## 2014-09-20 ENCOUNTER — Other Ambulatory Visit (HOSPITAL_COMMUNITY): Payer: Self-pay | Admitting: Hematology & Oncology

## 2014-09-20 DIAGNOSIS — D751 Secondary polycythemia: Secondary | ICD-10-CM

## 2014-09-20 DIAGNOSIS — D72829 Elevated white blood cell count, unspecified: Secondary | ICD-10-CM

## 2014-09-20 LAB — TESTOSTERONE: Testosterone: 389 ng/dL (ref 300–890)

## 2014-09-21 ENCOUNTER — Other Ambulatory Visit (HOSPITAL_COMMUNITY): Payer: Self-pay | Admitting: Physician Assistant

## 2014-09-21 DIAGNOSIS — G890 Central pain syndrome: Secondary | ICD-10-CM | POA: Diagnosis not present

## 2014-09-21 DIAGNOSIS — R748 Abnormal levels of other serum enzymes: Secondary | ICD-10-CM

## 2014-09-21 DIAGNOSIS — M5126 Other intervertebral disc displacement, lumbar region: Secondary | ICD-10-CM

## 2014-09-21 DIAGNOSIS — Z6829 Body mass index (BMI) 29.0-29.9, adult: Secondary | ICD-10-CM | POA: Diagnosis not present

## 2014-09-21 DIAGNOSIS — E663 Overweight: Secondary | ICD-10-CM | POA: Diagnosis not present

## 2014-09-21 DIAGNOSIS — R109 Unspecified abdominal pain: Secondary | ICD-10-CM | POA: Diagnosis not present

## 2014-09-21 LAB — ERYTHROPOIETIN: ERYTHROPOIETIN: 8.2 m[IU]/mL (ref 2.6–18.5)

## 2014-09-21 LAB — TESTOSTERONE: Testosterone: 389 ng/dL (ref 300–890)

## 2014-09-22 ENCOUNTER — Encounter (HOSPITAL_BASED_OUTPATIENT_CLINIC_OR_DEPARTMENT_OTHER): Payer: Medicare Other

## 2014-09-22 ENCOUNTER — Encounter (HOSPITAL_COMMUNITY): Payer: Self-pay

## 2014-09-22 ENCOUNTER — Other Ambulatory Visit (HOSPITAL_COMMUNITY): Payer: Self-pay | Admitting: Physician Assistant

## 2014-09-22 ENCOUNTER — Ambulatory Visit (HOSPITAL_COMMUNITY)
Admission: RE | Admit: 2014-09-22 | Discharge: 2014-09-22 | Disposition: A | Discharge status: Home / Self Care | Payer: Medicare Other | Source: Ambulatory Visit | Attending: Physician Assistant | Admitting: Physician Assistant

## 2014-09-22 DIAGNOSIS — R748 Abnormal levels of other serum enzymes: Secondary | ICD-10-CM

## 2014-09-22 DIAGNOSIS — G890 Central pain syndrome: Secondary | ICD-10-CM

## 2014-09-22 DIAGNOSIS — R945 Abnormal results of liver function studies: Secondary | ICD-10-CM | POA: Diagnosis not present

## 2014-09-22 DIAGNOSIS — R7989 Other specified abnormal findings of blood chemistry: Secondary | ICD-10-CM | POA: Insufficient documentation

## 2014-09-22 DIAGNOSIS — D751 Secondary polycythemia: Secondary | ICD-10-CM

## 2014-09-22 LAB — BCR/ABL GENE REARRANGEMENT QNT, PCR
BCR ABL1 / ABL1 IS: 0 %
BCR ABL1 / ABL1: 0 %

## 2014-09-22 LAB — SEX HORMONE BINDING GLOBULIN: Sex Hormone Binding: 55 nmol/L — ABNORMAL HIGH (ref 10–50)

## 2014-09-22 LAB — P210 BCR-ABL 1: P210 BCR ABL1: NOT DETECTED

## 2014-09-22 LAB — P190 BCR-ABL 1: P190 BCR ABL1: NOT DETECTED

## 2014-09-22 LAB — TESTOSTERONE, % FREE: Testosterone-% Free: 1.4 % — ABNORMAL LOW (ref 1.6–2.9)

## 2014-09-22 LAB — TESTOSTERONE, FREE: TESTOSTERONE FREE: 55.9 pg/mL (ref 47.0–244.0)

## 2014-09-22 NOTE — Progress Notes (Signed)
Timothy [name] presents today for phlebotomy per MD orders. Phlebotomy procedure started at 0907 and ended at 0914. 500 cc removed. Patient tolerated procedure well. IV needle removed intact.  Patient encouraged to drink plenty of fluids today. Patient told that he may feel tired today and into tomorrow. Patient instructed that he may or may not notice a difference in the way he feels until another TPR is performed. He said ok.

## 2014-09-27 ENCOUNTER — Encounter (HOSPITAL_COMMUNITY): Payer: Self-pay

## 2014-09-27 ENCOUNTER — Ambulatory Visit (HOSPITAL_COMMUNITY)
Admission: RE | Admit: 2014-09-27 | Discharge: 2014-09-27 | Disposition: A | Discharge status: Home / Self Care | Payer: Medicare Other | Source: Ambulatory Visit | Attending: Physician Assistant | Admitting: Physician Assistant

## 2014-09-27 ENCOUNTER — Encounter (HOSPITAL_BASED_OUTPATIENT_CLINIC_OR_DEPARTMENT_OTHER): Payer: Medicare Other

## 2014-09-27 DIAGNOSIS — G890 Central pain syndrome: Secondary | ICD-10-CM

## 2014-09-27 DIAGNOSIS — R51 Headache: Secondary | ICD-10-CM | POA: Diagnosis not present

## 2014-09-27 DIAGNOSIS — M5126 Other intervertebral disc displacement, lumbar region: Secondary | ICD-10-CM

## 2014-09-27 DIAGNOSIS — M47812 Spondylosis without myelopathy or radiculopathy, cervical region: Secondary | ICD-10-CM | POA: Diagnosis not present

## 2014-09-27 DIAGNOSIS — R1110 Vomiting, unspecified: Secondary | ICD-10-CM | POA: Diagnosis not present

## 2014-09-27 DIAGNOSIS — D751 Secondary polycythemia: Secondary | ICD-10-CM

## 2014-09-27 DIAGNOSIS — M5136 Other intervertebral disc degeneration, lumbar region: Secondary | ICD-10-CM | POA: Diagnosis not present

## 2014-09-27 NOTE — Progress Notes (Signed)
Timothy [name] presents today for phlebotomy per MD orders. Phlebotomy procedure started at 0957 and ended at 1007. 500 cc removed. Patient tolerated procedure well. IV needle removed intact.  Pt instructed to go by PCP's office today since he has been symptomatic of low NA. Pt fell 1 week ago. Patient said he would go by PCP office today. (Pt reported that his head felt funny and that his arm and leg was tingling and felt funny the other day)

## 2014-09-28 ENCOUNTER — Other Ambulatory Visit (HOSPITAL_COMMUNITY): Payer: Medicare Other

## 2014-10-01 NOTE — Progress Notes (Signed)
Glo Herring., MD [place] [id number]  Polycythemia - Plan: Carboxyhemoglobin, Pulmonary function test, CBC with Differential, Ferritin, Iron and TIBC, CBC with Differential, Ferritin, Iron and TIBC  Hepatomegaly - Plan: AFP tumor marker  CURRENT THERAPY: S/P 2 therapeutic phlebotomies  INTERVAL HISTORY: Morley P Hilliker 44 y.o. 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   I personally reviewed and went over laboratory results with the patient.  The results are noted within this dictation.  He just completed his third therapeutic phlebotomy.  He has tolerated it well.   I personally reviewed and went over radiographic studies with the patient.  The results are noted within this dictation.  Primary care provider ordered a number of imaging tests and I will defer to them to review.  I have noted the hepatomegaly and I will work this up further with an AFP in 4 weeks.  Other imaging tests are not oncologically or hematologically relevant.  Hematologically, he denies any complaints and ROS questioning is negative.  Past Medical History  Diagnosis Date  . Tachycardia   . Palpitations   . Syncope   . Anxiety   . HTN (hypertension)   . Wolff-Parkinson-White (WPW) syndrome     says was cured with ablation  . Central sleep apnea   . Tobacco abuse   . CAD (coronary artery disease) 2007    50% stenosis small diagonal  . Chronic pain     since 1998 has been on chronic pain medication  . GERD (gastroesophageal reflux disease)   . Arthritis   . Tubular adenoma   . Hemorrhoids   . Hepatomegaly 10/03/2014    has ANXIETY STATE, UNSPECIFIED; ESSENTIAL HYPERTENSION, BENIGN; SYNCOPE; Palpitations; TACHYCARDIA, HX OF; NIGHT SWEATS; CAD (coronary artery disease); Smoking; Hypokalemia; History of  Wolff-Parkinson-White syndrome; Malignant hypertension; Polycythemia; Tobacco abuse; Chronic anxiety; Leukocytosis; Laceration of arm; Chest tightness; Abdominal pain, chronic, right upper quadrant; Bowel habit changes; FH: colon cancer; Rectal bleeding; GERD (gastroesophageal reflux disease); Hemorrhoids; and Hepatomegaly on his problem list.     is allergic to morphine.  Mr. Wanless does not currently have medications on file.  Past Surgical History  Procedure Laterality Date  . Atrial ablation surgery    . Appendectomy    . Adenoidectomy    . Hand surgery    . Shoulder surgery    . Colonoscopy      age 65  . Av node ablation    . Cardiac electrophysiology study and ablation      SA node ablation  . Colonoscopy with propofol N/A 05/12/2013    Dr. Gala Romney- hemorrhoids, tubular adenoma  . Esophagogastroduodenoscopy (egd) with propofol N/A 05/12/2013    Dr. Gala Romney- hiatal hernia, erosive reflux esophagitis  . Esophageal biopsy N/A 05/12/2013    Procedure: BIOPSY;  Surgeon: Daneil Dolin, MD;  Location: [facility];  Service: Endoscopy;  Laterality: N/A;  . Polypectomy N/A 05/12/2013    Procedure: POLYPECTOMY;  Surgeon: Daneil Dolin, MD;  Location: [facility];  Service: Endoscopy;  Laterality: N/A;  . Hemorrhoid banding  06/22/13    Denies any headaches, double vision, fevers, chills, night sweats, nausea, vomiting, diarrhea, constipation, chest pain, heart palpitations, shortness of breath, blood in stool, black tarry stool, urinary pain, urinary burning, urinary frequency, hematuria.   PHYSICAL EXAMINATION  ECOG PERFORMANCE STATUS: 0 -  Asymptomatic  Filed Vitals:   10/03/14 1446  BP: 142/91  Pulse: 92  Temp: 98.6 F (37 C)  Resp: 20    GENERAL:alert, no distress, well nourished, well developed, comfortable, cooperative, obese and smiling SKIN: skin color, texture, turgor are normal, no rashes or significant lesions HEAD: Normocephalic, No masses, lesions, tenderness or  abnormalities EYES: normal, PERRLA, EOMI, Conjunctiva are pink and non-injected EARS: External ears normal OROPHARYNX:mucous membranes are moist  NECK: supple, no adenopathy, thyroid normal size, non-tender, without nodularity, no stridor, non-tender, trachea midline LYMPH:  no palpable lymphadenopathy BREAST:not examined LUNGS: clear to auscultation , decreased breath sounds HEART: regular rate & rhythm, no murmurs and no gallops ABDOMEN:abdomen soft, obese and normal bowel sounds BACK: Back symmetric, no curvature. EXTREMITIES:less then 2 second capillary refill, no joint deformities, effusion, or inflammation, no cyanosis  NEURO: alert & oriented x 3 with fluent speech, no focal motor/sensory deficits, gait normal    LABORATORY DATA: CBC    Component Value Date/Time   WBC 9.6 09/19/2014 1633   RBC 6.06* 09/19/2014 1633   HGB 19.1* 09/19/2014 1633   HCT 54.8* 09/19/2014 1633   PLT 337 09/19/2014 1633   MCV 90.4 09/19/2014 1633   MCH 31.5 09/19/2014 1633   MCHC 34.9 09/19/2014 1633   RDW 13.7 09/19/2014 1633   LYMPHSABS 2.8 09/19/2014 1633   MONOABS 0.7 09/19/2014 1633   EOSABS 0.1 09/19/2014 1633   BASOSABS 0.0 09/19/2014 1633      Chemistry      Component Value Date/Time   NA 133* 09/19/2014 1633   K 2.9* 09/19/2014 1633   CL 95* 09/19/2014 1633   CO2 25 09/19/2014 1633   BUN 11 09/19/2014 1633   CREATININE 0.86 09/19/2014 1633      Component Value Date/Time   CALCIUM 9.5 09/19/2014 1633   ALKPHOS 147* 09/19/2014 1633   AST 36 09/19/2014 1633   ALT 42 09/19/2014 1633   BILITOT 1.6* 09/19/2014 1633       RADIOGRAPHIC STUDIES:  Mr Brain Wo Contrast  09/27/2014   CLINICAL DATA:  Headaches with occasional vomiting. More severe on the right.  EXAM: MRI HEAD WITHOUT CONTRAST  TECHNIQUE: Multiplanar, multiecho pulse sequences of the brain and surrounding structures were obtained without intravenous contrast.  COMPARISON:  Head CT 08/24/2004  FINDINGS: Diffusion  imaging does not show any acute or subacute infarction. The brainstem and cerebellum are normal, except that there is vascular impression upon the left side of the brainstem because of the vertebral artery. This is a common finding and is usually asymptomatic. There are reports of this being associated with cranial nerve palsies and pyramidal tract symptoms, but not headache. The cerebral hemispheres show a few tiny foci of T2 and FLAIR signal in the deep white matter. The finding is commonly seen in nonspecific. One could not exclude an early manifestation of demyelinating disease, but that is not strongly suggested. No cortical or large vessel territory abnormality. No mass lesion, hemorrhage, hydrocephalus or extra-axial collection. No pituitary mass. No significant sinus disease. No skull or skullbase lesion.  IMPRESSION: No cause of headache is identified. Study does not show any likely significant finding. There are a few punctate white matter foci that are probably not of clinical relevance.  Impression upon the left side of the brainstem by a tortuous basilar artery. Usually, this is asymptomatic. It can be associated with cranial nerve palsy or pyramidal tract signs. I am not aware of any association with headache.   Electronically  Signed   By: Nelson Chimes M.D.   On: 09/27/2014 17:37   Mr Cervical Spine Wo Contrast  09/27/2014   CLINICAL DATA:  Headache with vomiting. Associated with severe pain in the arms and hands. Generalized weakness.  EXAM: MRI CERVICAL SPINE WITHOUT CONTRAST  TECHNIQUE: Multiplanar, multisequence MR imaging of the cervical spine was performed. No intravenous contrast was administered.  COMPARISON:  Radiography 12/18/2006.  CT 08/24/2004.  FINDINGS: The foramen magnum is widely patent.  C1-2 is normal.  C2-3: No disc pathology. Mild facet degeneration on the right. No slippage or stenosis.  C3-4: Mild bulging of the disc. Mild right-sided facet degeneration. No compressive  narrowing of the canal or foramina.  C4-5: Spondylosis with endplate osteophytes and bulging of the disc. Narrowing of the ventral subarachnoid space but no compression of the cord. No foraminal encroachment.  C5-6: Spondylosis with endplate osteophytes and bulging of the disc. Narrowing of the ventral subarachnoid space but no compression of the cord. No foraminal stenosis. Mild discogenic endplate marrow changes.  C6-7: Spondylosis with endplate osteophytes and bulging of the disc. Mild narrowing of the ventral subarachnoid space but no compression of the cord. No foraminal stenosis. Mild discogenic endplate marrow changes.  C7-T1:  Normal interspace.  No abnormal cord signal.  IMPRESSION: Degenerative spondylosis most pronounced at C5-6 and C6-7. There is no compressive stenosis of the canal or foramina. There are some discogenic marrow changes at C5, C6 and C7 which could be associated with neck pain.   Electronically Signed   By: Nelson Chimes M.D.   On: 09/27/2014 19:11   Mr Lumbar Spine Wo Contrast  09/27/2014   CLINICAL DATA:  Headaches and vomiting. Severe pain in the arms. Generalized weakness.  EXAM: MRI LUMBAR SPINE WITHOUT CONTRAST  TECHNIQUE: Multiplanar, multisequence MR imaging of the lumbar spine was performed. No intravenous contrast was administered.  COMPARISON:  CT 03/07/2013  FINDINGS: T11-12: Mild bulging of the disc.  No canal or foraminal stenosis.  T12-L1, L1-2 and L2-3:  Normal.  L3-4: Minimal desiccation and bulging of the disc. No stenosis or neural compression.  L4-5: Disc degeneration with circumferential bulging. Slight indentation of the thecal sac but no apparent neural compression.  L5-S1:  Minimal disc bulge.  No stenosis or neural compression.  No significant facet arthropathy. No focal osseous lesion. The distal cord and conus are normal with conus tip at L1  IMPRESSION: Minimal, non-compressive disc bulges at L3-4 and L5-S1.  Small circumferential disc bulge at L4-5 which  indents the thecal sac slightly but does not appear to cause neural compression.   Electronically Signed   By: Nelson Chimes M.D.   On: 09/27/2014 19:16   US Abdomen Limited Ruq  09/22/2014   CLINICAL DATA:  Elevated LFTs.  EXAM: US ABDOMEN LIMITED - RIGHT UPPER QUADRANT  COMPARISON:  None.  FINDINGS: Gallbladder:  Gallbladder is contracted making evaluation difficult. No definite stones identified. Gallbladder wall thickness 2.2 mm. Negative Murphy's sign.  Common bile duct:  Diameter: 3.6 mm.  Liver:  Liver is echogenic suggesting fatty infiltration and/or hepatocellular disease. Liver measures 18.4 cm.  IMPRESSION: Echogenic liver suggesting fatty infiltration and/or hepatocellular disease. Hepatomegaly cannot be excluded.   Electronically Signed   By: Marcello Moores  Register   On: 09/22/2014 09:12     ASSESSMENT AND PLAN:  Polycythemia Jak2 negative, BCR/ABL negative, elevated carboxyhemoglobin of 6.9%, WNL epo level, in the setting of tobacco abuse without any identifiable vitamin deficiencies.  S/P three therapeutic phlebotomies on  1/8, 1/13, and 10/03/2014.  We will repeat labs in 4 weeks and 8 weeks.  We will get him set-up for PFT in near future.  Return in 8 weeks for follow-up.   Hepatomegaly Hepatomegaly on [place] with element of fatty liver disease.  Hepatocellular Ca cannot be ruled out so we will check an AFP in 4 weeks.    THERAPY PLAN:  He has completed three weekly therapeutic phlebotomies and tolerated them well.  We will check labs in 4 and 8 weeks.  We will perform PFT in the interim.  All questions were answered. The patient knows to call the clinic with any problems, questions or concerns. We can certainly see the patient much sooner if necessary.  Patient and plan discussed with Dr. Ancil Linsey and she is in agreement with the aforementioned.   KEFALAS,THOMAS 10/03/2014

## 2014-10-02 ENCOUNTER — Other Ambulatory Visit (HOSPITAL_COMMUNITY): Payer: Self-pay | Admitting: Oncology

## 2014-10-02 LAB — MISCELLANEOUS TEST

## 2014-10-02 LAB — JAK2 GENOTYPR

## 2014-10-03 ENCOUNTER — Encounter (HOSPITAL_COMMUNITY): Payer: Self-pay | Admitting: Oncology

## 2014-10-03 ENCOUNTER — Encounter (HOSPITAL_COMMUNITY): Payer: Self-pay

## 2014-10-03 ENCOUNTER — Encounter (HOSPITAL_BASED_OUTPATIENT_CLINIC_OR_DEPARTMENT_OTHER): Payer: Medicare Other

## 2014-10-03 ENCOUNTER — Encounter (HOSPITAL_BASED_OUTPATIENT_CLINIC_OR_DEPARTMENT_OTHER): Payer: Medicare Other | Admitting: Oncology

## 2014-10-03 ENCOUNTER — Encounter (HOSPITAL_COMMUNITY): Payer: Medicare Other

## 2014-10-03 VITALS — BP 142/91 | HR 92 | Temp 98.6°F | Resp 20 | Wt 225.0 lb

## 2014-10-03 DIAGNOSIS — D751 Secondary polycythemia: Secondary | ICD-10-CM

## 2014-10-03 DIAGNOSIS — R160 Hepatomegaly, not elsewhere classified: Secondary | ICD-10-CM

## 2014-10-03 DIAGNOSIS — I10 Essential (primary) hypertension: Secondary | ICD-10-CM | POA: Diagnosis not present

## 2014-10-03 DIAGNOSIS — F17210 Nicotine dependence, cigarettes, uncomplicated: Secondary | ICD-10-CM | POA: Diagnosis not present

## 2014-10-03 DIAGNOSIS — K760 Fatty (change of) liver, not elsewhere classified: Secondary | ICD-10-CM | POA: Diagnosis not present

## 2014-10-03 DIAGNOSIS — G4733 Obstructive sleep apnea (adult) (pediatric): Secondary | ICD-10-CM | POA: Diagnosis not present

## 2014-10-03 HISTORY — DX: Hepatomegaly, not elsewhere classified: R16.0

## 2014-10-03 LAB — BASIC METABOLIC PANEL
Anion gap: 6 (ref 5–15)
BUN: 9 mg/dL (ref 6–23)
CO2: 25 mmol/L (ref 19–32)
Calcium: 8.9 mg/dL (ref 8.4–10.5)
Chloride: 103 mEq/L (ref 96–112)
Creatinine, Ser: 0.87 mg/dL (ref 0.50–1.35)
GFR calc Af Amer: >90 mL/min (ref >90)
GFR calc non Af Amer: >90 mL/min (ref >90)
Glucose, Bld: 96 mg/dL (ref 70–99)
POTASSIUM: 4 mmol/L (ref 3.5–5.1)
SODIUM: 134 mmol/L — AB (ref 135–145)

## 2014-10-03 NOTE — Assessment & Plan Note (Signed)
Hepatomegaly on [place] with element of fatty liver disease.  Hepatocellular Ca cannot be ruled out so we will check an AFP in 4 weeks.

## 2014-10-03 NOTE — Assessment & Plan Note (Addendum)
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  S/P three therapeutic phlebotomies on 1/8, 1/13, and 10/03/2014.  We will repeat labs in 4 weeks and 8 weeks.  We will get him set-up for PFT in near future.  Return in 8 weeks for follow-up.

## 2014-10-03 NOTE — Addendum Note (Signed)
Addended by: Berneta Levins on: 10/03/2014 03:59 PM   Modules accepted: Orders

## 2014-10-03 NOTE — Patient Instructions (Addendum)
[place] at [facility] Discharge Instructions  RECOMMENDATIONS MADE BY THE CONSULTANT AND ANY TEST RESULTS WILL BE SENT TO YOUR REFERRING PHYSICIAN.   Labs reviewed.  All tests are negative for any primary cause of elevated hemoglobin. Repeat labs in 4 and 8 weeks. Return in 8 weeks for follow-up appointment We will get you set-up for Pulmonary function testing in the near future.     Thank you for choosing [place] at [facility] to provide your oncology and hematology care.  To afford each patient quality time with our provider, please arrive at least 15 minutes before your scheduled appointment time.    You need to re-schedule your appointment should you arrive 10 or more minutes late.  We strive to give you quality time with our providers, and arriving late affects you and other patients whose appointments are after yours.  Also, if you no show three or more times for appointments you may be dismissed from the clinic at the providers discretion.     Again, thank you for choosing [facility].  Our hope is that these requests will decrease the amount of time that you wait before being seen by our physicians.       _____________________________________________________________  Should you have questions after your visit to [facility], please contact our office at [phone] between the hours of 8:30 a.m. and 4:30 p.m.  Voicemails left after 4:30 p.m. will not be returned until the following business day.  For prescription refill requests, have your pharmacy contact our office.   Pulmonary Function Tests Pulmonary function tests (PFTs) measure how well your lungs are working. The tests can help to identify the causes of lung problems. They can also help your health care provider select the best treatment for you. Your health care provider may order pulmonary function for any of the following reasons:  When an  illness involving the lungs is suspected.  To follow changes in your lung function over time if you are known to have a chronic lung disease.  For industrial plant workers to examine the effects of being exposed to chemicals over a long period of time.  To assess lung function prior to surgery or other procedures.  For people who are smokers. Your measured lung function will be compared to the expected lung function of someone with healthy lungs who is similar to you in age, gender, size, and other factors. This is used to determine your "percent predicted" lung function, which is how your health care provider knows if your lung function is normal or abnormal. If you have had prior pulmonary function testing performed, your health care provider will also compare your current results with past tests to see if your lung function is better, worse, or staying the same. This can sometimes be useful to see if treatments are working.  LET [facility] CARE PROVIDER KNOW ABOUT:  Any allergies you have.  All medicines you are taking, including inhaler or nebulizer medicines, vitamins, herbs, eye drops, creams, and over-the-counter medicines.  Any blood disorders you have.  Previous surgeries you have had, especially recent eye surgery, abdominal surgery, or chest surgery. These can make performing pulmonary function tests difficult or unsafe.  Medical conditions you have.  Chest pain or heart problems.  Tuberculosis or respiratory infections, such as pneumonia, a cold, or the flu. If you think you will have difficulty performing any of the breathing maneuvers, ask your  health care provider if you should reschedule the test. RISKS AND COMPLICATIONS: Generally, pulmonary function testing is a safe procedure. However, as with any procedure, complications can occur. Possible complications include:  Lightheadedness due to overbreathing (hyperventilation).  An asthmatic attack from deep  breathing. BEFORE THE PROCEDURE  Take medicine as directed by your health care provider. If you take inhaler or nebulizer medicines, ask your health care provider which medicines you should take on the day of your test. Some inhaler medicines may interfere with pulmonary function tests, such as bronchodilator testing, if taken shortly before the test.  Avoid eating a large meal before your test.  Do not smoke before your test.  Wear comfortable clothing which will not interfere with breathing. PROCEDURE  You will be given a soft nose clip to wear during the procedure. This is done so that all of your breaths will go through your mouth instead of your nose.  You will be given a germ-free (sterile) mouthpiece. It will be attached to a spirometer. The spirometer is the machine that measures your breathing.  You will be instructed to perform various breathing maneuvers. The maneuvers will be done by breathing in (inhaling) and breathing out (exhaling). Depending on what measurements are ordered, you may be asked to repeat the maneuvers several times before the test is completed.  It is important to follow the instructions exactly to obtain accurate results. Make sure to blow as hard and as fast as you can when you are instructed to do so.  You may be given a bronchodilator after testing has been performed. A bronchodilator is a medicine which makes the small air passages in your lungs larger. These medicines usually make it easier to breathe. The tests are then repeated several minutes later after the bronchodilator has taken effect.  You will be monitored carefully during the procedure for faintness, dizziness, difficulty breathing, or any other problems. AFTER THE PROCEDURE   You may resume your usual diet, medicines, and activities as directed by your health care provider.  Your health care provider will go over your test results with you and determine what treatments may be  helpful. Document Released: 04/24/2004 Document Revised: 06/22/2013 Document Reviewed: 03/31/2013 [facility] Patient Information 2015 [place], [place]. This information is not intended to replace advice given to you by your health care provider. Make sure you discuss any questions you have with your health care provider.

## 2014-10-03 NOTE — Progress Notes (Signed)
Linton Ham Brigham presents today for phlebotomy per MD orders. Phlebotomy procedure started at 1503 and ended at 1508. 500cc removed. Patient tolerated procedure well. IV needle removed intact.

## 2014-10-04 ENCOUNTER — Encounter (HOSPITAL_COMMUNITY): Payer: Medicare Other

## 2014-10-10 NOTE — Assessment & Plan Note (Signed)
44 year old male who presents for evaluation of polycythemia. Past medical history is remarkable for hypertension which has been difficult to control. He has by history severe obstructive sleep apnea but does not wear CPAP. He is a smoker currently only a half pack per day. He previously smoked more than a pack per day. 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 Vera. I will check a JAK 2 mutation today, B12 level, ferritin, CBC and rule out other causes of secondary polycythemia by checking an erythropoietin level as well. I will bring him back for phlebotomy if needed.   Even if he has secondary polycythemia there is evidence these patients should maintain a hematocrit less than 50, ideal hematocrit is uncertain. I discussed these issues with him today and advised him once all his laboratory studies are performed we will bring him back to discuss additional recommendations.

## 2014-10-11 ENCOUNTER — Inpatient Hospital Stay (HOSPITAL_COMMUNITY): Admission: RE | Admit: 2014-10-11 | Payer: Medicare Other | Source: Ambulatory Visit

## 2014-10-18 ENCOUNTER — Encounter: Payer: Self-pay | Admitting: Hematology & Oncology

## 2014-10-25 ENCOUNTER — Ambulatory Visit (HOSPITAL_COMMUNITY): Payer: Medicare Other

## 2014-10-28 ENCOUNTER — Inpatient Hospital Stay (HOSPITAL_COMMUNITY)
Admission: EM | Admit: 2014-10-28 | Discharge: 2014-10-30 | DRG: 069 | Disposition: A | Discharge status: Home / Self Care | Payer: Medicare Other | Attending: Internal Medicine | Admitting: Internal Medicine

## 2014-10-28 ENCOUNTER — Encounter (HOSPITAL_COMMUNITY): Payer: Self-pay | Admitting: *Deleted

## 2014-10-28 ENCOUNTER — Emergency Department (HOSPITAL_COMMUNITY): Payer: Medicare Other

## 2014-10-28 DIAGNOSIS — I2510 Atherosclerotic heart disease of native coronary artery without angina pectoris: Secondary | ICD-10-CM | POA: Diagnosis present

## 2014-10-28 DIAGNOSIS — G8929 Other chronic pain: Secondary | ICD-10-CM | POA: Diagnosis present

## 2014-10-28 DIAGNOSIS — Z6829 Body mass index (BMI) 29.0-29.9, adult: Secondary | ICD-10-CM

## 2014-10-28 DIAGNOSIS — I456 Pre-excitation syndrome: Secondary | ICD-10-CM | POA: Diagnosis present

## 2014-10-28 DIAGNOSIS — I10 Essential (primary) hypertension: Secondary | ICD-10-CM | POA: Diagnosis present

## 2014-10-28 DIAGNOSIS — R200 Anesthesia of skin: Secondary | ICD-10-CM | POA: Diagnosis present

## 2014-10-28 DIAGNOSIS — K219 Gastro-esophageal reflux disease without esophagitis: Secondary | ICD-10-CM | POA: Diagnosis present

## 2014-10-28 DIAGNOSIS — E860 Dehydration: Secondary | ICD-10-CM | POA: Diagnosis present

## 2014-10-28 DIAGNOSIS — R42 Dizziness and giddiness: Secondary | ICD-10-CM | POA: Diagnosis not present

## 2014-10-28 DIAGNOSIS — E876 Hypokalemia: Secondary | ICD-10-CM | POA: Diagnosis present

## 2014-10-28 DIAGNOSIS — Z720 Tobacco use: Secondary | ICD-10-CM

## 2014-10-28 DIAGNOSIS — E663 Overweight: Secondary | ICD-10-CM | POA: Diagnosis present

## 2014-10-28 DIAGNOSIS — D751 Secondary polycythemia: Secondary | ICD-10-CM | POA: Diagnosis present

## 2014-10-28 DIAGNOSIS — Z8679 Personal history of other diseases of the circulatory system: Secondary | ICD-10-CM

## 2014-10-28 DIAGNOSIS — M1990 Unspecified osteoarthritis, unspecified site: Secondary | ICD-10-CM | POA: Diagnosis present

## 2014-10-28 DIAGNOSIS — I639 Cerebral infarction, unspecified: Secondary | ICD-10-CM | POA: Diagnosis not present

## 2014-10-28 DIAGNOSIS — Z7982 Long term (current) use of aspirin: Secondary | ICD-10-CM

## 2014-10-28 DIAGNOSIS — F17200 Nicotine dependence, unspecified, uncomplicated: Secondary | ICD-10-CM | POA: Diagnosis present

## 2014-10-28 DIAGNOSIS — Z885 Allergy status to narcotic agent status: Secondary | ICD-10-CM | POA: Diagnosis not present

## 2014-10-28 DIAGNOSIS — R531 Weakness: Secondary | ICD-10-CM | POA: Diagnosis not present

## 2014-10-28 DIAGNOSIS — F17210 Nicotine dependence, cigarettes, uncomplicated: Secondary | ICD-10-CM | POA: Diagnosis present

## 2014-10-28 DIAGNOSIS — R160 Hepatomegaly, not elsewhere classified: Secondary | ICD-10-CM | POA: Diagnosis present

## 2014-10-28 DIAGNOSIS — I161 Hypertensive emergency: Secondary | ICD-10-CM

## 2014-10-28 DIAGNOSIS — I340 Nonrheumatic mitral (valve) insufficiency: Secondary | ICD-10-CM | POA: Diagnosis present

## 2014-10-28 DIAGNOSIS — F419 Anxiety disorder, unspecified: Secondary | ICD-10-CM | POA: Diagnosis present

## 2014-10-28 DIAGNOSIS — G459 Transient cerebral ischemic attack, unspecified: Principal | ICD-10-CM | POA: Diagnosis present

## 2014-10-28 DIAGNOSIS — G4731 Primary central sleep apnea: Secondary | ICD-10-CM | POA: Diagnosis present

## 2014-10-28 DIAGNOSIS — IMO0001 Reserved for inherently not codable concepts without codable children: Secondary | ICD-10-CM | POA: Diagnosis present

## 2014-10-28 LAB — COMPREHENSIVE METABOLIC PANEL
ALT: 36 U/L (ref 0–53)
ANION GAP: 10 (ref 5–15)
AST: 38 U/L — AB (ref 0–37)
Albumin: 4.6 g/dL (ref 3.5–5.2)
Alkaline Phosphatase: 116 U/L (ref 39–117)
BUN: 12 mg/dL (ref 6–23)
CALCIUM: 9.5 mg/dL (ref 8.4–10.5)
CHLORIDE: 91 mmol/L — AB (ref 96–112)
CO2: 33 mmol/L — ABNORMAL HIGH (ref 19–32)
Creatinine, Ser: 0.98 mg/dL (ref 0.50–1.35)
GFR calc non Af Amer: >90 mL/min (ref >90)
Glucose, Bld: 106 mg/dL — ABNORMAL HIGH (ref 70–99)
Potassium: 2.3 mmol/L — CL (ref 3.5–5.1)
Sodium: 134 mmol/L — ABNORMAL LOW (ref 135–145)
Total Bilirubin: 0.9 mg/dL (ref 0.3–1.2)
Total Protein: 8.4 g/dL — ABNORMAL HIGH (ref 6.0–8.3)

## 2014-10-28 LAB — CBC
HCT: 47 % (ref 39.0–52.0)
Hemoglobin: 16.6 g/dL (ref 13.0–17.0)
MCH: 31.2 pg (ref 26.0–34.0)
MCHC: 35.3 g/dL (ref 30.0–36.0)
MCV: 88.3 fL (ref 78.0–100.0)
Platelets: 382 10*3/uL (ref 150–400)
RBC: 5.32 MIL/uL (ref 4.22–5.81)
RDW: 13.4 % (ref 11.5–15.5)
WBC: 10.7 10*3/uL — ABNORMAL HIGH (ref 4.0–10.5)

## 2014-10-28 LAB — DIFFERENTIAL
Basophils Absolute: 0.1 10*3/uL (ref 0.0–0.1)
Basophils Relative: 1 % (ref 0–1)
Eosinophils Absolute: 0.1 10*3/uL (ref 0.0–0.7)
Eosinophils Relative: 1 % (ref 0–5)
LYMPHS PCT: 30 % (ref 12–46)
Lymphs Abs: 3.2 10*3/uL (ref 0.7–4.0)
Monocytes Absolute: 0.9 10*3/uL (ref 0.1–1.0)
Monocytes Relative: 9 % (ref 3–12)
Neutro Abs: 6.5 10*3/uL (ref 1.7–7.7)
Neutrophils Relative %: 61 % (ref 43–77)

## 2014-10-28 LAB — I-STAT CHEM 8, ED
BUN: 11 mg/dL (ref 6–23)
Calcium, Ion: 1.11 mmol/L — ABNORMAL LOW (ref 1.12–1.23)
Chloride: 89 mmol/L — ABNORMAL LOW (ref 96–112)
Creatinine, Ser: 1 mg/dL (ref 0.50–1.35)
GLUCOSE: 108 mg/dL — AB (ref 70–99)
HEMATOCRIT: 56 % — AB (ref 39.0–52.0)
Hemoglobin: 19 g/dL — ABNORMAL HIGH (ref 13.0–17.0)
Potassium: 2.4 mmol/L — CL (ref 3.5–5.1)
Sodium: 135 mmol/L (ref 135–145)
TCO2: 29 mmol/L (ref 0–100)

## 2014-10-28 LAB — I-STAT TROPONIN, ED: TROPONIN I, POC: 0.01 ng/mL (ref 0.00–0.08)

## 2014-10-28 LAB — APTT: APTT: 26 s (ref 24–37)

## 2014-10-28 LAB — PROTIME-INR
INR: 1.02 (ref 0.00–1.49)
Prothrombin Time: 13.5 seconds (ref 11.6–15.2)

## 2014-10-28 LAB — ETHANOL

## 2014-10-28 MED ORDER — ASPIRIN 325 MG PO TABS: 325.0000 mg | ORAL_TABLET | Freq: Once | ORAL | Status: DC

## 2014-10-28 MED ORDER — NICOTINE 14 MG/24HR TD PT24
14.0000 mg | MEDICATED_PATCH | Freq: Every day | TRANSDERMAL | Status: DC
  Administered 2014-10-29 (×2): 14 mg via TRANSDERMAL
  Filled 2014-10-28 (×2): qty 1

## 2014-10-28 MED ORDER — HEPARIN SODIUM (PORCINE) 5000 UNIT/ML IJ SOLN
5000.0000 [IU] | Freq: Three times a day (TID) | INTRAMUSCULAR | Status: DC
  Administered 2014-10-29 – 2014-10-30 (×5): 5000 [IU] via SUBCUTANEOUS
  Filled 2014-10-28 (×7): qty 1

## 2014-10-28 MED ORDER — POTASSIUM CHLORIDE 10 MEQ/100ML IV SOLN
10.0000 meq | Freq: Once | INTRAVENOUS | Status: AC
  Administered 2014-10-28: 10 meq via INTRAVENOUS
  Filled 2014-10-28: qty 100

## 2014-10-28 MED ORDER — ASPIRIN 300 MG RE SUPP
300.0000 mg | Freq: Once | RECTAL | Status: AC
  Administered 2014-10-28: 300 mg via RECTAL
  Filled 2014-10-28: qty 1

## 2014-10-28 MED ORDER — POTASSIUM CHLORIDE CRYS ER 20 MEQ PO TBCR: 40.0000 meq | EXTENDED_RELEASE_TABLET | Freq: Two times a day (BID) | ORAL | Status: DC

## 2014-10-28 MED ORDER — ONDANSETRON HCL 4 MG/2ML IJ SOLN: 4.0000 mg | Freq: Four times a day (QID) | INTRAMUSCULAR | Status: DC | PRN

## 2014-10-28 MED ORDER — ACETAMINOPHEN 325 MG PO TABS: 650.0000 mg | ORAL_TABLET | Freq: Four times a day (QID) | ORAL | Status: DC | PRN

## 2014-10-28 MED ORDER — AMLODIPINE BESYLATE 5 MG PO TABS: 5.0000 mg | ORAL_TABLET | Freq: Every day | ORAL | Status: DC

## 2014-10-28 MED ORDER — FAMOTIDINE 20 MG PO TABS
20.0000 mg | ORAL_TABLET | Freq: Every day | ORAL | Status: DC
  Administered 2014-10-29 – 2014-10-30 (×2): 20 mg via ORAL
  Filled 2014-10-28 (×2): qty 1

## 2014-10-28 MED ORDER — METOPROLOL SUCCINATE ER 100 MG PO TB24
100.0000 mg | ORAL_TABLET | Freq: Two times a day (BID) | ORAL | Status: DC
  Administered 2014-10-29 – 2014-10-30 (×3): 100 mg via ORAL
  Filled 2014-10-28 (×3): qty 1

## 2014-10-28 MED ORDER — ACETAMINOPHEN 650 MG RE SUPP: 650.0000 mg | Freq: Four times a day (QID) | RECTAL | Status: DC | PRN

## 2014-10-28 MED ORDER — POTASSIUM CHLORIDE 10 MEQ/100ML IV SOLN
10.0000 meq | INTRAVENOUS | Status: AC
  Administered 2014-10-29 (×3): 10 meq via INTRAVENOUS
  Filled 2014-10-28 (×9): qty 100

## 2014-10-28 MED ORDER — ONDANSETRON HCL 4 MG PO TABS: 4.0000 mg | ORAL_TABLET | Freq: Four times a day (QID) | ORAL | Status: DC | PRN

## 2014-10-28 MED ORDER — HYDRALAZINE HCL 20 MG/ML IJ SOLN
10.0000 mg | INTRAMUSCULAR | Status: DC | PRN
  Administered 2014-10-29: 10 mg via INTRAVENOUS
  Filled 2014-10-28 (×2): qty 1

## 2014-10-28 MED ORDER — STROKE: EARLY STAGES OF RECOVERY BOOK
Freq: Once | Status: AC
  Administered 2014-10-29
  Filled 2014-10-28: qty 1

## 2014-10-28 MED ORDER — ASPIRIN 300 MG RE SUPP
RECTAL | Status: AC
  Filled 2014-10-28: qty 1

## 2014-10-28 MED ORDER — ASPIRIN 325 MG PO TABS
325.0000 mg | ORAL_TABLET | Freq: Every day | ORAL | Status: DC
  Administered 2014-10-29 – 2014-10-30 (×2): 325 mg via ORAL
  Filled 2014-10-28 (×2): qty 1

## 2014-10-28 MED ORDER — CLONIDINE HCL 0.1 MG PO TABS
0.3000 mg | ORAL_TABLET | Freq: Two times a day (BID) | ORAL | Status: DC
  Administered 2014-10-29 (×2): 0.3 mg via ORAL
  Filled 2014-10-28 (×2): qty 3

## 2014-10-28 MED ORDER — METHOCARBAMOL 1000 MG/10ML IJ SOLN
500.0000 mg | Freq: Once | INTRAMUSCULAR | Status: AC
Start: 2014-10-28 — End: 2014-10-29
  Administered 2014-10-29: 500 mg via INTRAVENOUS
  Filled 2014-10-28: qty 5

## 2014-10-28 MED ORDER — OXYCODONE HCL 5 MG PO TABS: 5.0000 mg | ORAL_TABLET | ORAL | Status: DC | PRN

## 2014-10-28 MED ORDER — SODIUM CHLORIDE 0.9 % IJ SOLN
3.0000 mL | Freq: Two times a day (BID) | INTRAMUSCULAR | Status: DC
  Administered 2014-10-28 – 2014-10-29 (×3): 3 mL via INTRAVENOUS

## 2014-10-28 MED ORDER — HYDROCHLOROTHIAZIDE 25 MG PO TABS
25.0000 mg | ORAL_TABLET | Freq: Every day | ORAL | Status: DC
  Administered 2014-10-29: 25 mg via ORAL
  Filled 2014-10-28: qty 1

## 2014-10-28 MED ORDER — ONDANSETRON HCL 4 MG/2ML IJ SOLN
4.0000 mg | Freq: Once | INTRAMUSCULAR | Status: AC
  Administered 2014-10-28: 4 mg via INTRAVENOUS
  Filled 2014-10-28: qty 2

## 2014-10-28 MED ORDER — POLYVINYL ALCOHOL 1.4 % OP SOLN: 1.0000 [drp] | Freq: Every day | OPHTHALMIC | Status: DC | PRN

## 2014-10-28 MED ORDER — KETOROLAC TROMETHAMINE 30 MG/ML IJ SOLN
30.0000 mg | Freq: Once | INTRAMUSCULAR | Status: AC
  Administered 2014-10-29: 30 mg via INTRAVENOUS
  Filled 2014-10-28: qty 1

## 2014-10-28 NOTE — ED Notes (Addendum)
Pt states tingling sensation to his left  hand, face, then foot. States numbness/tingling is moving up his extremities. States that this began ~12:30. States "arm feels very clammy" and "foot feels like it is in cold water or something" Pt also states recent MRI of the neck. Also states periods of nausea.

## 2014-10-28 NOTE — H&P (Addendum)
[facility] History and Physical  Timothy [name] XAJ:[id number] DOB: Nov 30, 1970 DOA: 10/28/2014  Referring physician: Dr Lacinda Axon - [facility] PCP: Glo Herring., MD   Chief Complaint: L sided numbness  HPI: Timothy [name] is a 44 y.o. male  Left upper extremity lower extremity and facial numbness. Started at 1300. Started in his fingertips as a pinprick or tingling sensation. Patient said it felt like it was "asleep". Symptoms progressed proximally up his arm. Similar sensation that started in his left toes and foot. The symptoms then progressed to weakness. Left facial numbness associated with left upper and lower lips. Associated with dizziness. Denies difficulty swallowing syncope, headache, chest pain, fever, abdominal pain, nausea vomiting diarrhea. Nothing relieves or worsens symptoms. Speech and mentation normal per patient Intermittent palpitations which are normal per patient  No longer progressing but not improving.   Of note patient with significant past head or medical history for Wolff-Parkinson-White syndrome status post 3 ablations. Most recent ablation 7 years ago.   Review of Systems:  Constitutional:  No weight loss, night sweats, Fevers, chills, fatigue.  HEENT:  No headaches, Difficulty swallowing,Tooth/dental problems,Sore throat,  No sneezing, itching, ear ache, nasal congestion, post nasal drip,  Cardio-vascular:  No chest pain, Orthopnea, PND, swelling in lower extremities, anasarca, dizziness  GI:  No heartburn, indigestion, abdominal pain, nausea, vomiting, diarrhea, change in bowel habits, loss of appetite  Resp:   No shortness of breath with exertion or at rest. No excess mucus, no productive cough, No non-productive cough, No coughing up of blood.No change in color of mucus.No wheezing.No chest wall deformity  Skin:  no rash or lesions.  GU:  no dysuria, change in color of urine, no urgency or frequency. No flank pain.  Musculoskeletal:   No joint  pain or swelling. No decreased range of motion. No back pain.  Psych:  No change in mood or affect. No depression or anxiety. No memory loss.   Past Medical History  Diagnosis Date  . Tachycardia   . Palpitations   . Syncope   . Anxiety   . HTN (hypertension)   . Wolff-Parkinson-White (WPW) syndrome     says was cured with ablation  . Central sleep apnea   . Tobacco abuse   . CAD (coronary artery disease) 2007    50% stenosis small diagonal  . Chronic pain     since 1998 has been on chronic pain medication  . GERD (gastroesophageal reflux disease)   . Arthritis   . Tubular adenoma   . Hemorrhoids   . Hepatomegaly 10/03/2014   Past Surgical History  Procedure Laterality Date  . Atrial ablation surgery    . Appendectomy    . Adenoidectomy    . Hand surgery    . Shoulder surgery    . Colonoscopy      age 58  . Av node ablation    . Cardiac electrophysiology study and ablation      SA node ablation  . Colonoscopy with propofol N/A 05/12/2013    Dr. Gala Romney- hemorrhoids, tubular adenoma  . Esophagogastroduodenoscopy (egd) with propofol N/A 05/12/2013    Dr. Gala Romney- hiatal hernia, erosive reflux esophagitis  . Esophageal biopsy N/A 05/12/2013    Procedure: BIOPSY;  Surgeon: Daneil Dolin, MD;  Location: [facility];  Service: Endoscopy;  Laterality: N/A;  . Polypectomy N/A 05/12/2013    Procedure: POLYPECTOMY;  Surgeon: Daneil Dolin, MD;  Location: [facility];  Service: Endoscopy;  Laterality: N/A;  . Hemorrhoid banding  06/22/13   Social History:  reports that he has been smoking Cigarettes.  He has a 12.5 pack-year smoking history. He does not have any smokeless tobacco history on file. He reports that he drinks about 1.2 oz of alcohol per week. He reports that he does not use illicit drugs.  Allergies  Allergen Reactions  . Morphine Other (See Comments)    Makes overly sleepy    Family History  Problem Relation Age of Onset  . Coronary artery disease Father     cabg  . Breast  cancer Mother   . Colon cancer Father     age 54, chemo/surgery     Prior to Admission medications   Medication Sig Start Date End Date Taking? Authorizing Provider  acetaminophen (TYLENOL) 500 MG tablet Take 500-1,500 mg by mouth daily as needed for pain.   Yes Historical Provider, MD  amLODipine (NORVASC) 5 MG tablet Take 1 tablet (5 mg total) by mouth daily. 05/03/14  Yes Arnoldo Lenis, MD  aspirin 81 MG tablet Take 81 mg by mouth daily.     Yes Historical Provider, MD  cloNIDine (CATAPRES) 0.3 MG tablet Take 0.3 mg by mouth 2 (two) times daily.   Yes Historical Provider, MD  furosemide (LASIX) 20 MG tablet Take 20 mg by mouth 2 (two) times daily.   Yes Historical Provider, MD  hydrochlorothiazide (HYDRODIURIL) 25 MG tablet Take 1 tablet (25 mg total) by mouth daily. 03/09/13  Yes Lezlie Octave Black, NP  hydroxypropyl methylcellulose / hypromellose (ISOPTO TEARS / GONIOVISC) 2.5 % ophthalmic solution Place 1 drop into both eyes daily as needed for dry eyes.   Yes Historical Provider, MD  metoprolol (TOPROL-XL) 100 MG 24 hr tablet Take 100 mg by mouth 2 (two) times daily.     Yes Historical Provider, MD  mirtazapine (REMERON) 15 MG tablet Take 15 mg by mouth at bedtime as needed and may repeat dose one time if needed (for sleep).  03/01/13  Yes Historical Provider, MD  Multiple Vitamins-Minerals (MENS MULTIVITAMIN PLUS PO) Take 1 tablet by mouth daily.   Yes Historical Provider, MD  Oxycodone HCl 20 MG TABS Take 20 mg by mouth every 6 (six) hours as needed (Pain).  07/18/14  Yes Historical Provider, MD  potassium chloride SA (K-DUR,KLOR-CON) 20 MEQ tablet Take 40 mEq by mouth 2 (two) times daily. 03/09/13  Yes Lezlie Octave Black, NP  ranitidine (ZANTAC) 150 MG tablet Take 150 mg by mouth as needed. OTC medication   Yes Historical Provider, MD   Physical Exam: Filed Vitals:   10/28/14 1900 10/28/14 2000 10/28/14 2026 10/28/14 2100  BP: 161/101 170/105 180/120 172/108  Pulse: 104  98 95  Temp:        TempSrc:      Resp: 18 19 17 17   Height:      Weight:      SpO2: 95%  95% 97%    Wt Readings from Last 3 Encounters:  10/28/14 95.255 kg (210 lb)  10/03/14 102.059 kg (225 lb)  09/27/14 99.791 kg (220 lb)    General:  Appears calm and comfortable Eyes:  PERRL, normal lids, irises & conjunctiva ENT:  grossly normal hearing, lips & tongue Neck:  no LAD, masses or thyromegaly Cardiovascular:  RRR, no m/r/g. No LE edema. Telemetry:  SR, no arrhythmias  Respiratory:  CTA bilaterally, no w/r/r. Normal respiratory effort. Abdomen:  soft, ntnd Skin:  no rash or induration seen on limited exam Musculoskeletal:  grossly normal tone  BUE/BLE Psychiatric:  grossly normal mood and affect, speech fluent and appropriate Neurologic: CN 2-12 intact, no dysmetria, no central ataxia, upper and lower extremity strength 5 out of 5 bilaterally. No change in sensation on exam. Patient endorses "what feeling" or "pins and needles" type of sensation.          Labs on Admission:  Basic Metabolic Panel:  Recent Labs Lab 10/28/14 1755 10/28/14 1830  NA 134* 135  K 2.3* 2.4*  CL 91* 89*  CO2 33*  --   GLUCOSE 106* 108*  BUN 12 11  CREATININE 0.98 1.00  CALCIUM 9.5  --    Liver Function Tests:  Recent Labs Lab 10/28/14 1755  AST 38*  ALT 36  ALKPHOS 116  BILITOT 0.9  PROT 8.4*  ALBUMIN 4.6   No results for input(s): LIPASE, AMYLASE in the last 168 hours. No results for input(s): AMMONIA in the last 168 hours. CBC:  Recent Labs Lab 10/28/14 1755 10/28/14 1830  WBC 10.7*  --   NEUTROABS 6.5  --   HGB 16.6 19.0*  HCT 47.0 56.0*  MCV 88.3  --   PLT 382  --    Cardiac Enzymes: No results for input(s): CKTOTAL, CKMB, CKMBINDEX, TROPONINI in the last 168 hours.  BNP (last 3 results) No results for input(s): BNP in the last 8760 hours.  ProBNP (last 3 results) No results for input(s): PROBNP in the last 8760 hours.  CBG: No results for input(s): GLUCAP in the last 168  hours.  Radiological Exams on Admission: Ct Head Wo Contrast  10/28/2014   CLINICAL DATA:  44 year old male code stroke. Left side weakness. Initial encounter.  EXAM: CT HEAD WITHOUT CONTRAST  TECHNIQUE: Contiguous axial images were obtained from the base of the skull through the vertex without intravenous contrast.  COMPARISON:  Brain MRI 09/27/2014 and earlier.  FINDINGS: Visualized paranasal sinuses and mastoids are clear. No acute osseous abnormality identified. Visualized orbits and scalp soft tissues are within normal limits.  No midline shift, mass effect, or evidence of intracranial mass lesion. No ventriculomegaly. Gray-white matter differentiation is within normal limits throughout the brain. No evidence of cortically based acute infarction identified. No suspicious intracranial vascular hyperdensity. No acute intracranial hemorrhage identified.  IMPRESSION: Stable and normal noncontrast CT appearance of the brain.  Study discussed by telephone with Dr. Nat Christen on 10/28/2014 at 18:28 .   Electronically Signed   By: Genevie Ann M.D.   On: 10/28/2014 18:31    EKG: Independently reviewed. RBBB, sinus, tach. RBBB noted on previous rhythm strip on 05/03/14  Assessment/Plan Principal Problem:   Left sided numbness Active Problems:   CAD (coronary artery disease)   Smoking   History of Wolff-Parkinson-White syndrome   Polycythemia   GERD (gastroesophageal reflux disease)   Stroke   Hypertensive emergency  L sided numbness and weakness: TIA vs stroke. Dr. Lacinda Axon discussed w/ Neurology who is requesting pt transfer to [facility]. Pt endorses difficulty w/ L side but not appreciated on exam. Pt w/ complete and symmetric sensation in all UE, LE and face. Pt w/ significant risk factors for stroke including arrhythmia, HTN, Smoker and possibly polycythemia???. ABCD2 score of 5.  - Admit to [facility] - Tele - MRI/MRA - Carotid dopplers - Echo - ASA - Neuro checks - Bedside swallow - Risk stratification  labs - PT/OT  HypoK: intermittently low for pt in past. 2.4 on admission. ED gave 36mEq IV. No significant EKG changes - Kdur 3mEq x1 -  KCL 5mEq x6 - BMET in am  Hypertensive emergency: marked elevation (180/120). Will allow for permissive  - Restart Norvasc, CLonidine, Metop, and HCTZ in the am - hydralazine PRN SBP >180 and DBP > 110 - Hold Lasix due to Hypok until nml. Pt to seek clarification by outpt team on reason for lasix dose as he states this is for HTN, not LE edema or heart failure  Polycythemia: Hgb 19 on admission. Recent phlebotomy of 500cc on 10/03/14 to drop count. Workup by Oncology ongoing but inconclusive at this time. Count elevated as high as 20.4 in 2014. Ongoing issue since 2012 per lab work.  - f/u outpt  H/o WPW: 3 ablations in the past. Most recent one 7 years ago. Occasionally w/ palpitations. No distinct delta waves noted on EKG - f/u Cards outpt  GERD:  - continue zantac  Insomnia./Depression??: medication listed as PRN for pts sleep  - continue home Remeron  Tobacco: smokes 1/2ppd - Nicotine  Code Status: FULL DVT Prophylaxis: Hep Family Communication: Mother and son Disposition Plan: Pending [facility] to [facility] and further stroke workup  MERRELL, DAVID J, MD Family Medicine [facility] [email or web address] Password [employer]    Dragon dictation services used for this H&P

## 2014-10-28 NOTE — Progress Notes (Signed)
Pt. Arrived via 2 [facility] workers with VSS and reporting no pain will continue to monitor. Ruben Gottron, RN 10/28/2014 11:15 PM

## 2014-10-28 NOTE — ED Notes (Signed)
CRITICAL VALUE ALERT  Critical value received:  K+ 2.3  Date of notification:  10/28/14  Time of notification:  1908  Critical value read back:Yes.    Nurse who received alert:  [facility]  MD notified (1st page):  Lacinda Axon  Time of first page:  1908  MD notified (2nd page):  Time of second page:  Responding MD:  Lacinda Axon  Time MD responded:  [phone]

## 2014-10-28 NOTE — ED Provider Notes (Signed)
CSN: [id number]     Arrival date & time 10/28/14  1736 History  This chart was scribed for Nat Christen, MD by Stephania Fragmin, ED Scribe. This patient was seen in room [facility] and the patient's care was started at 5:55 PM.     Chief Complaint  Patient presents with  . Transient Ischemic Attack   The history is provided by the patient and a parent. No language interpreter was used.     HPI Comments: Timothy [name] is a 44 y.o. male with an extensive past medical history who presents to the Emergency Department complaining of numbness, weakness, and tingling in his left arm, left leg, and left sided mouth that began about 5 hours ago, between 12:30 and 1:00 PM. He first noticed numbness and tingling in his left fingers, which felt like they were "asleep," and the sensation gradually traveled up his arm. He then felt the same sensation initially in his big toe, which gradually spread to his other toes and up his foot and leg up to his knee. He states he begun to have weakness in his left arm and leg as well. Patient also notes numbness and tingling in his left sided upper and lower lips, upper greater than lower. He complains of associated dizziness and the sensation that his arm and feet feel like they're "in cold water." However, patient states his vision is good and his mother states his speech seems normal. No modifying factors were noted.  Patient has an extensive past medical history, including 3 atrial ablation surgeries(at ages 56, 62, and 48) done by Dr. Caryl Comes at [facility], Dr. Rosita Fire, and Dr. Rosana Hoes to treat Wolff-Parkinson-White syndrome. He also takes 4 medications for hypertension--HCTZ, Toprol, Lisinopril, and Norvasc. He notes that 3 weeks ago, he had a phlebotomy which drew 3 pints of blood, which alleviated his hypertension. Patient also had a recent "head scan" due to shooting pain down his neck and back, which showed "white matter tied in with a blood vessel"--he will repeat the scan in 6  months.   Dr. Gerarda Fraction is patient's PCP.  Past Medical History  Diagnosis Date  . Tachycardia   . Palpitations   . Syncope   . Anxiety   . HTN (hypertension)   . Wolff-Parkinson-White (WPW) syndrome     says was cured with ablation  . Central sleep apnea   . Tobacco abuse   . CAD (coronary artery disease) 2007    50% stenosis small diagonal  . Chronic pain     since 1998 has been on chronic pain medication  . GERD (gastroesophageal reflux disease)   . Arthritis   . Tubular adenoma   . Hemorrhoids   . Hepatomegaly 10/03/2014   Past Surgical History  Procedure Laterality Date  . Atrial ablation surgery    . Appendectomy    . Adenoidectomy    . Hand surgery    . Shoulder surgery    . Colonoscopy      age 56  . Av node ablation    . Cardiac electrophysiology study and ablation      SA node ablation  . Colonoscopy with propofol N/A 05/12/2013    Dr. Gala Romney- hemorrhoids, tubular adenoma  . Esophagogastroduodenoscopy (egd) with propofol N/A 05/12/2013    Dr. Gala Romney- hiatal hernia, erosive reflux esophagitis  . Esophageal biopsy N/A 05/12/2013    Procedure: BIOPSY;  Surgeon: Daneil Dolin, MD;  Location: [facility];  Service: Endoscopy;  Laterality: N/A;  . Polypectomy N/A 05/12/2013  Procedure: POLYPECTOMY;  Surgeon: Daneil Dolin, MD;  Location: [facility];  Service: Endoscopy;  Laterality: N/A;  . Hemorrhoid banding  06/22/13   Family History  Problem Relation Age of Onset  . Coronary artery disease Father     cabg  . Breast cancer Mother   . Colon cancer Father     age 67, chemo/surgery   History  Substance Use Topics  . Smoking status: Current Every Day Smoker -- 0.50 packs/day for 25 years    Types: Cigarettes  . Smokeless tobacco: Not on file  . Alcohol Use: 1.2 oz/week    2 Cans of beer per week     Comment: daily    Review of Systems  Eyes: Negative for visual disturbance.  Neurological: Positive for dizziness, weakness and numbness. Negative for speech  difficulty.  All other systems reviewed and are negative.     Allergies  Morphine  Home Medications   Prior to Admission medications   Medication Sig Start Date End Date Taking? Authorizing Provider  acetaminophen (TYLENOL) 500 MG tablet Take 500-1,500 mg by mouth daily as needed for pain.   Yes Historical Provider, MD  amLODipine (NORVASC) 5 MG tablet Take 1 tablet (5 mg total) by mouth daily. 05/03/14  Yes Arnoldo Lenis, MD  aspirin 81 MG tablet Take 81 mg by mouth daily.     Yes Historical Provider, MD  cloNIDine (CATAPRES) 0.3 MG tablet Take 0.3 mg by mouth 2 (two) times daily.   Yes Historical Provider, MD  furosemide (LASIX) 20 MG tablet Take 20 mg by mouth 2 (two) times daily.   Yes Historical Provider, MD  hydrochlorothiazide (HYDRODIURIL) 25 MG tablet Take 1 tablet (25 mg total) by mouth daily. 03/09/13  Yes Lezlie Octave Black, NP  hydroxypropyl methylcellulose / hypromellose (ISOPTO TEARS / GONIOVISC) 2.5 % ophthalmic solution Place 1 drop into both eyes daily as needed for dry eyes.   Yes Historical Provider, MD  metoprolol (TOPROL-XL) 100 MG 24 hr tablet Take 100 mg by mouth 2 (two) times daily.     Yes Historical Provider, MD  mirtazapine (REMERON) 15 MG tablet Take 15 mg by mouth at bedtime as needed and may repeat dose one time if needed (for sleep).  03/01/13  Yes Historical Provider, MD  Multiple Vitamins-Minerals (MENS MULTIVITAMIN PLUS PO) Take 1 tablet by mouth daily.   Yes Historical Provider, MD  Oxycodone HCl 20 MG TABS Take 20 mg by mouth every 6 (six) hours as needed (Pain).  07/18/14  Yes Historical Provider, MD  potassium chloride SA (K-DUR,KLOR-CON) 20 MEQ tablet Take 40 mEq by mouth 2 (two) times daily. 03/09/13  Yes Lezlie Octave Black, NP  ranitidine (ZANTAC) 150 MG tablet Take 150 mg by mouth as needed. OTC medication   Yes Historical Provider, MD   BP 170/105 mmHg  Pulse 104  Temp(Src) 98.2 F (36.8 C) (Oral)  Resp 19  Ht 6' (1.829 m)  Wt 210 lb (95.255 kg)  BMI  28.47 kg/m2  SpO2 95% Physical Exam  Constitutional: He is oriented to person, place, and time. He appears well-developed and well-nourished.  HENT:  Head: Normocephalic and atraumatic.  Eyes: Conjunctivae and EOM are normal. Pupils are equal, round, and reactive to light.  Neck: Normal range of motion. Neck supple.  Cardiovascular: Normal rate and regular rhythm.   Pulmonary/Chest: Effort normal and breath sounds normal.  Abdominal: Soft. Bowel sounds are normal.  Musculoskeletal: Normal range of motion.  Neurological: He is alert and  oriented to person, place, and time.  Per patient's statement, left arm and left leg were slightly weaker.  Skin: Skin is warm and dry.  Psychiatric: He has a normal mood and affect. His behavior is normal.  Nursing note and vitals reviewed.   ED Course  Procedures (including critical care time)  DIAGNOSTIC STUDIES: Oxygen Saturation is 99% on room air, normal by my interpretation.    COORDINATION OF CARE: 6:02 PM - Discussed treatment plan with pt at bedside which includes CT head and probable hospital admission, and pt agreed to plan.   Labs Review Labs Reviewed  CBC - Abnormal; Notable for the following:    WBC 10.7 (*)    All other components within normal limits  COMPREHENSIVE METABOLIC PANEL - Abnormal; Notable for the following:    Sodium 134 (*)    Potassium 2.3 (*)    Chloride 91 (*)    CO2 33 (*)    Glucose, Bld 106 (*)    Total Protein 8.4 (*)    AST 38 (*)    All other components within normal limits  I-STAT CHEM 8, ED - Abnormal; Notable for the following:    Potassium 2.4 (*)    Chloride 89 (*)    Glucose, Bld 108 (*)    Calcium, Ion 1.11 (*)    Hemoglobin 19.0 (*)    HCT 56.0 (*)    All other components within normal limits  ETHANOL  PROTIME-INR  APTT  DIFFERENTIAL  URINE RAPID DRUG SCREEN (HOSP PERFORMED)  URINALYSIS, ROUTINE W REFLEX MICROSCOPIC  I-STAT TROPOININ, ED    Imaging Review Ct Head Wo  Contrast  10/28/2014   CLINICAL DATA:  44 year old male code stroke. Left side weakness. Initial encounter.  EXAM: CT HEAD WITHOUT CONTRAST  TECHNIQUE: Contiguous axial images were obtained from the base of the skull through the vertex without intravenous contrast.  COMPARISON:  Brain MRI 09/27/2014 and earlier.  FINDINGS: Visualized paranasal sinuses and mastoids are clear. No acute osseous abnormality identified. Visualized orbits and scalp soft tissues are within normal limits.  No midline shift, mass effect, or evidence of intracranial mass lesion. No ventriculomegaly. Gray-white matter differentiation is within normal limits throughout the brain. No evidence of cortically based acute infarction identified. No suspicious intracranial vascular hyperdensity. No acute intracranial hemorrhage identified.  IMPRESSION: Stable and normal noncontrast CT appearance of the brain.  Study discussed by telephone with Dr. Nat Christen on 10/28/2014 at 18:28 .   Electronically Signed   By: Genevie Ann M.D.   On: 10/28/2014 18:31     EKG Interpretation   Date/Time:  Saturday October 28 2014 18:27:56 EST Ventricular Rate:  114 PR Interval:  141 QRS Duration: 149 QT Interval:  397 QTC Calculation: 547 R Axis:   41 Text Interpretation:  Sinus tachycardia Right bundle branch block  Confirmed by Demarco Bacci  MD, Jeannene Tschetter ([id number]) on 10/28/2014 6:41:10 PM     CRITICAL CARE Performed by: Nat Christen Total critical care time: 40 Critical care time was exclusive of separately billable procedures and treating other patients. Critical care was necessary to treat or prevent imminent or life-threatening deterioration. Critical care was time spent personally by me on the following activities: development of treatment plan with patient and/or surrogate as well as nursing, discussions with consultants, evaluation of patient's response to treatment, examination of patient, obtaining history from patient or surrogate, ordering and performing  treatments and interventions, ordering and review of laboratory studies, ordering and review of radiographic studies, pulse oximetry  and re-evaluation of patient's condition. MDM   Final diagnoses:  Cerebral infarction due to unspecified mechanism   History and physical consistent with stroke. Code stroke called. Discussed with neurologist. Patient is hemodynamically stable. Will transfer to [facility].  I personally performed the services described in this documentation, which was scribed in my presence. The recorded information has been reviewed and is accurate.     Nat Christen, MD 10/28/14 2015

## 2014-10-28 NOTE — Progress Notes (Signed)
Called and received report from [place] at [facility] now awaiting [facility]. Joaquin Bend E, RN  8:38 PM 10/28/2014

## 2014-10-28 NOTE — ED Notes (Signed)
Patient c/o nausea. EDP made aware-verbal order given.

## 2014-10-28 NOTE — Progress Notes (Signed)
Called to get report from [facility] with no answer will try again. Ruben Gottron, [place]  10/28/2014 8:28 PM

## 2014-10-29 ENCOUNTER — Inpatient Hospital Stay (HOSPITAL_COMMUNITY): Payer: Medicare Other

## 2014-10-29 DIAGNOSIS — I639 Cerebral infarction, unspecified: Secondary | ICD-10-CM

## 2014-10-29 LAB — COMPREHENSIVE METABOLIC PANEL
ALK PHOS: 96 U/L (ref 39–117)
ALT: 27 U/L (ref 0–53)
ANION GAP: 9 (ref 5–15)
AST: 30 U/L (ref 0–37)
Albumin: 3.2 g/dL — ABNORMAL LOW (ref 3.5–5.2)
BILIRUBIN TOTAL: 1.9 mg/dL — AB (ref 0.3–1.2)
BUN: 7 mg/dL (ref 6–23)
CHLORIDE: 93 mmol/L — AB (ref 96–112)
CO2: 28 mmol/L (ref 19–32)
Calcium: 9 mg/dL (ref 8.4–10.5)
Creatinine, Ser: 0.91 mg/dL (ref 0.50–1.35)
GFR calc non Af Amer: >90 mL/min (ref >90)
GLUCOSE: 81 mg/dL (ref 70–99)
Potassium: 2.7 mmol/L — CL (ref 3.5–5.1)
Sodium: 130 mmol/L — ABNORMAL LOW (ref 135–145)
TOTAL PROTEIN: 6.3 g/dL (ref 6.0–8.3)

## 2014-10-29 LAB — LIPID PANEL
Cholesterol: 187 mg/dL (ref 0–200)
HDL: 38 mg/dL — ABNORMAL LOW (ref >39)
LDL Cholesterol: 129 mg/dL — ABNORMAL HIGH (ref 0–99)
Total CHOL/HDL Ratio: 4.9 RATIO
Triglycerides: 102 mg/dL (ref <150)
VLDL: 20 mg/dL (ref 0–40)

## 2014-10-29 LAB — URINALYSIS, ROUTINE W REFLEX MICROSCOPIC
Bilirubin Urine: NEGATIVE
Bilirubin Urine: NEGATIVE
GLUCOSE, UA: NEGATIVE mg/dL
Glucose, UA: NEGATIVE mg/dL
Ketones, ur: 15 mg/dL — AB
Ketones, ur: 15 mg/dL — AB
LEUKOCYTES UA: NEGATIVE
Leukocytes, UA: NEGATIVE
Nitrite: NEGATIVE
Nitrite: NEGATIVE
PH: 6 (ref 5.0–8.0)
PH: 6.5 (ref 5.0–8.0)
PROTEIN: NEGATIVE mg/dL
Protein, ur: NEGATIVE mg/dL
Specific Gravity, Urine: 1.01 (ref 1.005–1.030)
Specific Gravity, Urine: 1.007 (ref 1.005–1.030)
UROBILINOGEN UA: 0.2 mg/dL (ref 0.0–1.0)
Urobilinogen, UA: 0.2 mg/dL (ref 0.0–1.0)

## 2014-10-29 LAB — GLUCOSE, CAPILLARY
Glucose-Capillary: 90 mg/dL (ref 70–99)
Glucose-Capillary: 83 mg/dL (ref 70–99)
Glucose-Capillary: 86 mg/dL (ref 70–99)
Glucose-Capillary: 111 mg/dL — ABNORMAL HIGH (ref 70–99)
Glucose-Capillary: 102 mg/dL — ABNORMAL HIGH (ref 70–99)
Glucose-Capillary: 102 mg/dL — ABNORMAL HIGH (ref 70–99)

## 2014-10-29 LAB — CBC
HCT: 42 % (ref 39.0–52.0)
Hemoglobin: 14.2 g/dL (ref 13.0–17.0)
MCH: 29.5 pg (ref 26.0–34.0)
MCHC: 33.8 g/dL (ref 30.0–36.0)
MCV: 87.1 fL (ref 78.0–100.0)
PLATELETS: 343 10*3/uL (ref 150–400)
RBC: 4.82 MIL/uL (ref 4.22–5.81)
RDW: 13.3 % (ref 11.5–15.5)
WBC: 7.2 10*3/uL (ref 4.0–10.5)

## 2014-10-29 LAB — RAPID URINE DRUG SCREEN, HOSP PERFORMED
Amphetamines: NOT DETECTED
BENZODIAZEPINES: NOT DETECTED
Barbiturates: NOT DETECTED
COCAINE: NOT DETECTED
OPIATES: POSITIVE — AB
Tetrahydrocannabinol: POSITIVE — AB

## 2014-10-29 LAB — URINE MICROSCOPIC-ADD ON

## 2014-10-29 LAB — MAGNESIUM: MAGNESIUM: 2 mg/dL (ref 1.5–2.5)

## 2014-10-29 LAB — POTASSIUM: POTASSIUM: 3.6 mmol/L (ref 3.5–5.1)

## 2014-10-29 MED ORDER — HYDROMORPHONE HCL 1 MG/ML IJ SOLN
1.0000 mg | Freq: Once | INTRAMUSCULAR | Status: AC
  Administered 2014-10-29: 1 mg via INTRAVENOUS
  Filled 2014-10-29: qty 1

## 2014-10-29 MED ORDER — POTASSIUM CHLORIDE CRYS ER 20 MEQ PO TBCR
40.0000 meq | EXTENDED_RELEASE_TABLET | Freq: Two times a day (BID) | ORAL | Status: DC
  Administered 2014-10-29 (×2): 40 meq via ORAL
  Filled 2014-10-29 (×2): qty 2

## 2014-10-29 MED ORDER — SENNA 8.6 MG PO TABS
1.0000 | ORAL_TABLET | Freq: Two times a day (BID) | ORAL | Status: DC
  Administered 2014-10-29 – 2014-10-30 (×2): 8.6 mg via ORAL
  Filled 2014-10-29 (×2): qty 1

## 2014-10-29 MED ORDER — NICOTINE 21 MG/24HR TD PT24
21.0000 mg | MEDICATED_PATCH | Freq: Every day | TRANSDERMAL | Status: DC
  Administered 2014-10-29 – 2014-10-30 (×2): 21 mg via TRANSDERMAL
  Filled 2014-10-29 (×2): qty 1

## 2014-10-29 MED ORDER — POTASSIUM CHLORIDE 10 MEQ/100ML IV SOLN
10.0000 meq | INTRAVENOUS | Status: AC
  Administered 2014-10-29 (×4): 10 meq via INTRAVENOUS
  Filled 2014-10-29 (×3): qty 100

## 2014-10-29 MED ORDER — AMLODIPINE BESYLATE 10 MG PO TABS
10.0000 mg | ORAL_TABLET | Freq: Every day | ORAL | Status: DC
  Administered 2014-10-29 – 2014-10-30 (×2): 10 mg via ORAL
  Filled 2014-10-29 (×2): qty 1

## 2014-10-29 MED ORDER — POTASSIUM CHLORIDE 10 MEQ/100ML IV SOLN
10.0000 meq | INTRAVENOUS | Status: AC
  Administered 2014-10-29 (×2): 10 meq via INTRAVENOUS

## 2014-10-29 MED ORDER — POTASSIUM CHLORIDE CRYS ER 20 MEQ PO TBCR
40.0000 meq | EXTENDED_RELEASE_TABLET | Freq: Once | ORAL | Status: AC
  Administered 2014-10-29: 40 meq via ORAL
  Filled 2014-10-29: qty 2

## 2014-10-29 MED ORDER — OXYCODONE HCL 5 MG PO TABS
20.0000 mg | ORAL_TABLET | Freq: Four times a day (QID) | ORAL | Status: DC | PRN
  Administered 2014-10-29 – 2014-10-30 (×5): 20 mg via ORAL
  Filled 2014-10-29 (×9): qty 4

## 2014-10-29 MED ORDER — HYDRALAZINE HCL 25 MG PO TABS
25.0000 mg | ORAL_TABLET | Freq: Three times a day (TID) | ORAL | Status: DC
  Administered 2014-10-29: 25 mg via ORAL
  Filled 2014-10-29: qty 1

## 2014-10-29 NOTE — Progress Notes (Signed)
Physical Therapy Evaluation Patient Details Name: Timothy [name]. MRN: [id number] DOB: 1971/05/23 Today's Date: 10/29/2014   History of Present Illness  Patient is a 44 year old male with CAD, GERD, WPW syndrome, palpitations presented with Left upper extremity lower extremity and facial numbness. Started at 1300 on the day of admission in his fingertips as a pinprick or tingling sensation. Patient said it felt like it was "asleep". Symptoms progressed proximally up his arm. Similar sensation that started in his left toes and foot. The symptoms then progressed to weakness. Left facial numbness associated with left upper and lower lips. Associated with dizziness.  MRI report states no acute change.  Patient also with hypokalemia.  Clinical Impression  Patient is independent with all mobility and gait.  No acute PT needs identified - PT will sign off.    Follow Up Recommendations No PT follow up;Supervision - Intermittent (May want to consider OP PT for back pain at some point)    Equipment Recommendations  None recommended by PT    Recommendations for Other Services       Precautions / Restrictions Precautions Precautions: None Restrictions Weight Bearing Restrictions: No      Mobility  Bed Mobility Overal bed mobility: Independent                Transfers Overall transfer level: Independent Equipment used: None                Ambulation/Gait Ambulation/Gait assistance: Independent Ambulation Distance (Feet): 120 Feet Assistive device: None Gait Pattern/deviations: WFL(Within Functional Limits)   Gait velocity interpretation: at or above normal speed for age/gender General Gait Details: Patient demonstrates good gait pattern, speed, and balance.  Stairs Stairs: Yes Stairs assistance: Supervision Stair Management: No rails;Alternating pattern;Forwards Number of Stairs: 5 (x2) General stair comments: No assist required  Wheelchair Mobility     Modified Rankin (Stroke Patients Only) Modified Rankin (Stroke Patients Only) Pre-Morbid Rankin Score: No symptoms Modified Rankin: No significant disability (Reports continued numbness in Lt foot and hand)     Balance                                 Standardized Balance Assessment Standardized Balance Assessment : Dynamic Gait Index   Dynamic Gait Index Level Surface: Normal Change in Gait Speed: Normal Gait with Horizontal Head Turns: Normal Gait with Vertical Head Turns: Normal Gait and Pivot Turn: Normal Step Over Obstacle: Normal Step Around Obstacles: Normal Steps: Normal Total Score: 24       Pertinent Vitals/Pain Pain Assessment: 0-10 Pain Score: 5  Pain Location: back and neck (chronic) Pain Descriptors / Indicators: Aching;Nagging;Sore Pain Intervention(s): Monitored during session;Premedicated before session;Repositioned    Home Living Family/patient expects to be discharged to:: Private residence Living Arrangements: Spouse/significant other;Children Available Help at Discharge: Family;Available 24 hours/day Type of Home: House       Home Layout: One level Home Equipment: None      Prior Function Level of Independence: Independent         Comments: Works in physical job.     Hand Dominance   Dominant Hand: Right    Extremity/Trunk Assessment   Upper Extremity Assessment: Overall WFL for tasks assessed (Patient reports continued "numbness" Lt hand)           Lower Extremity Assessment: Overall WFL for tasks assessed (Patient reports continued "numbness" Lt foot)      Cervical / Trunk Assessment:  Normal  Communication   Communication: No difficulties  Cognition Arousal/Alertness: Awake/alert Behavior During Therapy: WFL for tasks assessed/performed Overall Cognitive Status: Within Functional Limits for tasks assessed                      General Comments      Exercises        Assessment/Plan     PT Assessment Patent does not need any further PT services  PT Diagnosis Abnormality of gait   PT Problem List    PT Treatment Interventions     PT Goals (Current goals can be found in the Care Plan section) Acute Rehab PT Goals PT Goal Formulation: All assessment and education complete, DC therapy    Frequency     Barriers to discharge        Co-evaluation               End of Session   Activity Tolerance: Patient tolerated treatment well Patient left: in bed;with call bell/phone within reach;with bed alarm set;with family/visitor present Nurse Communication: Mobility status (Safe to be up ambulating on his own)         Time: 2440-1027 PT Time Calculation (min) (ACUTE ONLY): 18 min   Charges:   PT Evaluation $Initial PT Evaluation Tier I: 1 Procedure     PT G CodesDespina Pole 11-13-14, 1:43 PM Carita Pian. Sanjuana Kava, [place] Pager [phone]

## 2014-10-29 NOTE — Progress Notes (Signed)
MD informed via amion text that K+ remains low at 2.7.

## 2014-10-29 NOTE — Progress Notes (Signed)
Patient ID: Timothy [name].  male  BPZ:[id number]    DOB: 12-29-1970    DOA: 10/28/2014  PCP: Glo Herring., MD   Patient is a 44 year old male with CAD, GERD, WPW syndrome, palpitations presented with Left upper extremity lower extremity and facial numbness. Started at 1300 on the day of admission in his fingertips as a pinprick or tingling sensation. Patient said it felt like it was "asleep". Symptoms progressed proximally up his arm. Similar sensation that started in his left toes and foot. The symptoms then progressed to weakness. Left facial numbness associated with left upper and lower lips. Associated with dizziness. Denied difficulty swallowing syncope, headache, chest pain, fever, abdominal pain, nausea vomiting diarrhea. Intermittent palpitations which are normal per patient. Patient was found to have hypokalemia with potassium of 2.3 at the time of admission, on HCTZ and Lasix at home.  Assessment/Plan: Principal Problem:   Left sided numbness: Likely TIA - MRI of the brain showed no acute intracranial process - MRA showed no acute vascular process, complete circle of Willis - 2-D echo, carotid Doppler pending - PT/OT/ST pending - Lipid panel, hemoglobin A1c pending  Active Problems: Hypokalemia - Recheck potassium this morning, CMET pending - Discontinued Lasix, patient denies history of CHF or peripheral edema, reports that Lasix was started for hypertension. He takes oral potassium 38meq BID, if continues to be low, may need to stop HCTZ as well    Accelerated hypertension - Restarted Norvasc, clonidine, Toprol-XL, for now continue HCTZ. Also added hydralazine oral. - Discontinued Lasix  Dehydration with polycythemia - Hemoglobin 19 on admission, patient had recent phlebotomy of 500 mL on 10/03/14, following oncology outpatient workup.  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 Follow up  outpatient with cardiology  Nicotine use Patient counseled strongly on tobacco cessation, continue nicotine   GERD  Continue PPI  DVT Prophylaxis: heparin subcutaneous   Code Status: full CODE STATUS   Family Communication: patient alert and oriented 4, family member in the room   Disposition: possible DC home today if all workup is done   Consultants: Neuro  Procedures:MRI of the brain, MRA  Antibiotics : None  Subjective :  Patient seen and examined, reports left-sided numbness improving, no chest pain or shortness of breath, frustrated on being NPO   Objective  Weight change:   Intake/Output Summary (Last 24 hours) at 10/29/14 1011 Last data filed at 10/29/14 0511  Gross per 24 hour  Intake     33 ml  Output    600 ml  Net   -567 ml   Blood pressure 159/108, pulse 70, temperature 97.7 F (36.5 C), temperature source Oral, resp. rate 16, height 6' (1.829 m), weight 97.7 kg (215 lb 6.2 oz), SpO2 95 %.  Physical Exam: General: Alert and awake, oriented x3, not in any acute distress. HEENT: anicteric sclera, PERLA, EOMI CVS: S1-S2 clear, no murmur rubs or gallops Chest: clear to auscultation bilaterally, no wheezing, rales or rhonchi Abdomen: soft nontender, nondistended, normal bowel sounds  Extremities: no cyanosis, clubbing or edema noted bilaterally Neuro: Cranial nerves II-XII intact, no focal neurological deficits  Lab Results: Basic Metabolic Panel:  Recent Labs Lab 10/28/14 1755 10/28/14 1830  NA 134* 135  K 2.3* 2.4*  CL 91* 89*  CO2 33*  --   GLUCOSE 106* 108*  BUN 12 11  CREATININE 0.98 1.00  CALCIUM 9.5  --    Liver  Function Tests:  Recent Labs Lab 10/28/14 1755  AST 38*  ALT 36  ALKPHOS 116  BILITOT 0.9  PROT 8.4*  ALBUMIN 4.6   No results for input(s): LIPASE, AMYLASE in the last 168 hours. No results for input(s): AMMONIA in the last 168 hours. CBC:  Recent Labs Lab 10/28/14 1755 10/28/14 1830 10/29/14 0858  WBC  10.7*  --  7.2  NEUTROABS 6.5  --   --   HGB 16.6 19.0* 14.2  HCT 47.0 56.0* 42.0  MCV 88.3  --  87.1  PLT 382  --  343   Cardiac Enzymes: No results for input(s): CKTOTAL, CKMB, CKMBINDEX, TROPONINI in the last 168 hours. BNP: Invalid input(s): POCBNP CBG:  Recent Labs Lab 10/29/14 0203 10/29/14 0417 10/29/14 0814  GLUCAP 90 83 86     Micro Results: No results found for this or any previous visit (from the past 240 hour(s)).  Studies/Results: Ct Head Wo Contrast  10/28/2014   CLINICAL DATA:  44 year old male code stroke. Left side weakness. Initial encounter.  EXAM: CT HEAD WITHOUT CONTRAST  TECHNIQUE: Contiguous axial images were obtained from the base of the skull through the vertex without intravenous contrast.  COMPARISON:  Brain MRI 09/27/2014 and earlier.  FINDINGS: Visualized paranasal sinuses and mastoids are clear. No acute osseous abnormality identified. Visualized orbits and scalp soft tissues are within normal limits.  No midline shift, mass effect, or evidence of intracranial mass lesion. No ventriculomegaly. Gray-white matter differentiation is within normal limits throughout the brain. No evidence of cortically based acute infarction identified. No suspicious intracranial vascular hyperdensity. No acute intracranial hemorrhage identified.  IMPRESSION: Stable and normal noncontrast CT appearance of the brain.  Study discussed by telephone with Dr. Nat Christen on 10/28/2014 at 18:28 .   Electronically Signed   By: Genevie Ann M.D.   On: 10/28/2014 18:31   Mri Brain Without Contrast  10/29/2014   CLINICAL DATA:  LEFT upper extremity weakness and facial numbness beginning yesterday afternoon. Symptoms ascending, similar symptoms in LEFT foot. Weakness. Dizziness. History of coronary artery disease and hypertension.  EXAM: MRI HEAD WITHOUT CONTRAST  MRA HEAD WITHOUT CONTRAST  TECHNIQUE: Multiplanar, multiecho pulse sequences of the brain and surrounding structures were obtained  without intravenous contrast. Angiographic images of the head were obtained using MRA technique without contrast.  COMPARISON:  CT of the head October 28, 2014 and MRI of the brain September 27, 2014  FINDINGS: MRI HEAD FINDINGS  The ventricles and sulci are normal for patient's age. No abnormal parenchymal signal, mass lesions, mass effect. No reduced diffusion to suggest acute ischemia. No susceptibility artifact to suggest hemorrhage. Cerebellar tonsils are at but not through the foramen magnum. A few tiny supratentorial white matter FLAIR T2 hyperintensities, within normal range for patient's age and though nonspecific could represent chronic small vessel ischemic disease.  No abnormal extra-axial fluid collections. No extra-axial masses though, contrast enhanced sequences would be more sensitive. Normal major intracranial vascular flow voids seen at the skull base. Tortuous LEFT vertebral artery again noted to deform the cervical medullary junction.  Ocular globes and orbital contents are unremarkable though not tailored for evaluation. No abnormal sellar expansion. Small LEFT maxillary mucosal retention cysts without paranasal sinus air-fluid level. The mastoid air cells appear well-aerated. No suspicious calvarial bone marrow signal. No abnormal sellar expansion. Craniocervical junction maintained.  MRA HEAD FINDINGS  Anterior circulation: Normal flow related enhancement of the included cervical, petrous, cavernous and supra clinoid internal carotid arteries. Patent  anterior communicating artery. Normal flow related enhancement of the anterior and middle cerebral arteries, including more distal segments. Fenestrated proximal LEFT A2 segment area  No large vessel occlusion, high-grade stenosis, abnormal luminal irregularity, aneurysm.  Posterior circulation: LEFT vertebral artery is dominant. Basilar artery is patent, with normal flow related enhancement of the main branch vessels. Diminutive P1 segments with  compensatory robust bilateral posterior communicating arteries. Normal flow related enhancement of the posterior cerebral arteries.  No large vessel occlusion, high-grade stenosis, abnormal luminal irregularity, aneurysm.  IMPRESSION: MRI HEAD: No acute intracranial process; normal noncontrast MRI of the brain, stable from September 27, 2014.  MRA HEAD: No acute vascular process, complete circle of Willis.   Electronically Signed   By: Elon Alas   On: 10/29/2014 01:43   Mr Jodene Nam Head/brain Wo Cm  10/29/2014   CLINICAL DATA:  LEFT upper extremity weakness and facial numbness beginning yesterday afternoon. Symptoms ascending, similar symptoms in LEFT foot. Weakness. Dizziness. History of coronary artery disease and hypertension.  EXAM: MRI HEAD WITHOUT CONTRAST  MRA HEAD WITHOUT CONTRAST  TECHNIQUE: Multiplanar, multiecho pulse sequences of the brain and surrounding structures were obtained without intravenous contrast. Angiographic images of the head were obtained using MRA technique without contrast.  COMPARISON:  CT of the head October 28, 2014 and MRI of the brain September 27, 2014  FINDINGS: MRI HEAD FINDINGS  The ventricles and sulci are normal for patient's age. No abnormal parenchymal signal, mass lesions, mass effect. No reduced diffusion to suggest acute ischemia. No susceptibility artifact to suggest hemorrhage. Cerebellar tonsils are at but not through the foramen magnum. A few tiny supratentorial white matter FLAIR T2 hyperintensities, within normal range for patient's age and though nonspecific could represent chronic small vessel ischemic disease.  No abnormal extra-axial fluid collections. No extra-axial masses though, contrast enhanced sequences would be more sensitive. Normal major intracranial vascular flow voids seen at the skull base. Tortuous LEFT vertebral artery again noted to deform the cervical medullary junction.  Ocular globes and orbital contents are unremarkable though not tailored  for evaluation. No abnormal sellar expansion. Small LEFT maxillary mucosal retention cysts without paranasal sinus air-fluid level. The mastoid air cells appear well-aerated. No suspicious calvarial bone marrow signal. No abnormal sellar expansion. Craniocervical junction maintained.  MRA HEAD FINDINGS  Anterior circulation: Normal flow related enhancement of the included cervical, petrous, cavernous and supra clinoid internal carotid arteries. Patent anterior communicating artery. Normal flow related enhancement of the anterior and middle cerebral arteries, including more distal segments. Fenestrated proximal LEFT A2 segment area  No large vessel occlusion, high-grade stenosis, abnormal luminal irregularity, aneurysm.  Posterior circulation: LEFT vertebral artery is dominant. Basilar artery is patent, with normal flow related enhancement of the main branch vessels. Diminutive P1 segments with compensatory robust bilateral posterior communicating arteries. Normal flow related enhancement of the posterior cerebral arteries.  No large vessel occlusion, high-grade stenosis, abnormal luminal irregularity, aneurysm.  IMPRESSION: MRI HEAD: No acute intracranial process; normal noncontrast MRI of the brain, stable from September 27, 2014.  MRA HEAD: No acute vascular process, complete circle of Willis.   Electronically Signed   By: Elon Alas   On: 10/29/2014 01:43    Medications: Scheduled Meds: . amLODipine  10 mg Oral Daily  . aspirin  325 mg Oral Daily  . cloNIDine  0.3 mg Oral BID  . famotidine  20 mg Oral Daily  . heparin  5,000 Units Subcutaneous 3 times per day  . hydrALAZINE  25 mg  Oral 3 times per day  . hydrochlorothiazide  25 mg Oral Daily  . metoprolol succinate  100 mg Oral BID  . nicotine  14 mg Transdermal Daily  . potassium chloride SA  40 mEq Oral BID  . sodium chloride  3 mL Intravenous Q12H   time spent 25 minutes    LOS: 1 day   RAI,RIPUDEEP M.D. [facility] 10/29/2014, 10:11 AM Pager: [phone]  If 7PM-7AM, please contact night-coverage [email or web address] Password [email or web address]

## 2014-10-29 NOTE — Evaluation (Signed)
Clinical/Bedside Swallow Evaluation Patient Details  Name: Timothy [name]. MRN: [id number] Date of Birth: 12-25-1970  Today's Date: 10/29/2014 Time: SLP Start Time (ACUTE ONLY): 62 SLP Stop Time (ACUTE ONLY): 0630 SLP Time Calculation (min) (ACUTE ONLY): 10 min  Past Medical History:  Past Medical History  Diagnosis Date  . Tachycardia   . Palpitations   . Syncope   . Anxiety   . HTN (hypertension)   . Wolff-Parkinson-White (WPW) syndrome     says was cured with ablation  . Central sleep apnea   . Tobacco abuse   . CAD (coronary artery disease) 2007    50% stenosis small diagonal  . Chronic pain     since 1998 has been on chronic pain medication  . GERD (gastroesophageal reflux disease)   . Arthritis   . Tubular adenoma   . Hemorrhoids   . Hepatomegaly 10/03/2014   Past Surgical History:  Past Surgical History  Procedure Laterality Date  . Atrial ablation surgery    . Appendectomy    . Adenoidectomy    . Hand surgery    . Shoulder surgery    . Colonoscopy      age 13  . Av node ablation    . Cardiac electrophysiology study and ablation      SA node ablation  . Colonoscopy with propofol N/A 05/12/2013    Dr. Gala Romney- hemorrhoids, tubular adenoma  . Esophagogastroduodenoscopy (egd) with propofol N/A 05/12/2013    Dr. Gala Romney- hiatal hernia, erosive reflux esophagitis  . Esophageal biopsy N/A 05/12/2013    Procedure: BIOPSY;  Surgeon: Daneil Dolin, MD;  Location: [facility];  Service: Endoscopy;  Laterality: N/A;  . Polypectomy N/A 05/12/2013    Procedure: POLYPECTOMY;  Surgeon: Daneil Dolin, MD;  Location: [facility];  Service: Endoscopy;  Laterality: N/A;  . Hemorrhoid banding  06/22/13   HPI:  44 year old male admitted with c/o left sided numbness. Head CT and MRI without acute changes. PMH of anxiety, CAD, GERD, arthritis, tobacco abuse.    Assessment / Plan / Recommendation Clinical Impression  Patient presents with a functional oropharyngeal swallow. No overt  indication of aspiration observed. Mild left sided labial numbness present but not impacting function.     Aspiration Risk  Mild    Diet Recommendation Regular;Thin liquid   Liquid Administration via: Cup;Straw Medication Administration: Whole meds with liquid Supervision: Patient able to self feed Compensations: Slow rate;Small sips/bites Postural Changes and/or Swallow Maneuvers: Seated upright 90 degrees    Other  Recommendations Oral Care Recommendations: Oral care BID   Follow Up Recommendations  None       Pertinent Vitals/Pain n/a        Swallow Study    General HPI: 44 year old male admitted with c/o left sided numbness. Head CT and MRI without acute changes. PMH of anxiety, CAD, GERD, arthritis, tobacco abuse.  Type of Study: Bedside swallow evaluation Previous Swallow Assessment: none Diet Prior to this Study: NPO Temperature Spikes Noted: No Respiratory Status: Room air History of Recent Intubation: No Behavior/Cognition: Alert;Cooperative;Pleasant mood Oral Cavity - Dentition: Adequate natural dentition Self-Feeding Abilities: Able to feed self Patient Positioning: Upright in bed Baseline Vocal Quality: Clear Volitional Cough: Strong Volitional Swallow: Able to elicit    Oral/Motor/Sensory Function Overall Oral Motor/Sensory Function: Appears within functional limits for tasks assessed (other than mild left sided labial numbness)   Ice Chips Ice chips: Not tested   Thin Liquid Thin Liquid: Within functional limits Presentation:  Cup;Self Fed;Straw    Nectar Thick Nectar Thick Liquid: Not tested   Honey Thick Honey Thick Liquid: Not tested   Puree Puree: Within functional limits Presentation: Self Fed;Spoon   Solid   GO   Timothy Heinecke MA, CCC-SLP [phone]  Solid: Within functional limits Presentation: [place] 10/29/2014,9:33 AM

## 2014-10-29 NOTE — Consult Note (Addendum)
Referring Physician: Lacinda Axon    Chief Complaint: Left sided numbness  HPI: Timothy [name]. is an 44 y.o. male who reports that he was at home on yesterday and noted the acute onset of numbness in his left hand.  It travelled up his arm and then went to the top lip on the right side of his mouth.  It developed to include the left side of his face and then the left leg with the foot being most severely affected.  His symptoms did not resolve and began to feel as if "fluid was moving".  The patient presented at that time.  Code stroke was called at [place] and he was deemed not a tPA candidate by the teleneurologist that evaluated him.  Patient was transferred to [facility] at that time.    Date last known well: Date: 10/28/2014 Time last known well: Time: 13:00 tPA Given: No: Outside time window  Past Medical History  Diagnosis Date  . Tachycardia   . Palpitations   . Syncope   . Anxiety   . HTN (hypertension)   . Wolff-Parkinson-White (WPW) syndrome     says was cured with ablation  . Central sleep apnea   . Tobacco abuse   . CAD (coronary artery disease) 2007    50% stenosis small diagonal  . Chronic pain     since 1998 has been on chronic pain medication  . GERD (gastroesophageal reflux disease)   . Arthritis   . Tubular adenoma   . Hemorrhoids   . Hepatomegaly 10/03/2014    Past Surgical History  Procedure Laterality Date  . Atrial ablation surgery    . Appendectomy    . Adenoidectomy    . Hand surgery    . Shoulder surgery    . Colonoscopy      age 50  . Av node ablation    . Cardiac electrophysiology study and ablation      SA node ablation  . Colonoscopy with propofol N/A 05/12/2013    Dr. Gala Romney- hemorrhoids, tubular adenoma  . Esophagogastroduodenoscopy (egd) with propofol N/A 05/12/2013    Dr. Gala Romney- hiatal hernia, erosive reflux esophagitis  . Esophageal biopsy N/A 05/12/2013    Procedure: BIOPSY;  Surgeon: Daneil Dolin, MD;  Location: [facility];  Service: Endoscopy;   Laterality: N/A;  . Polypectomy N/A 05/12/2013    Procedure: POLYPECTOMY;  Surgeon: Daneil Dolin, MD;  Location: [facility];  Service: Endoscopy;  Laterality: N/A;  . Hemorrhoid banding  06/22/13    Family History  Problem Relation Age of Onset  . Coronary artery disease Father     cabg  . Breast cancer Mother   . Colon cancer Father     age 87, chemo/surgery   Social History:  reports that he has been smoking Cigarettes.  He has a 12.5 pack-year smoking history. He does not have any smokeless tobacco history on file. He reports that he drinks about 1.2 oz of alcohol per week. He reports that he does not use illicit drugs.  Allergies:  Allergies  Allergen Reactions  . Morphine Other (See Comments)    Makes overly sleepy    Medications:  I have reviewed the patient's current medications. Prior to Admission:  Prescriptions prior to admission  Medication Sig Dispense Refill Last Dose  . acetaminophen (TYLENOL) 500 MG tablet Take 500-1,500 mg by mouth daily as needed for pain.   10/28/2014  . amLODipine (NORVASC) 5 MG tablet Take 1 tablet (5 mg total) by  mouth daily. 30 tablet 6 10/28/2014  . aspirin 81 MG tablet Take 81 mg by mouth daily.     10/28/2014  . cloNIDine (CATAPRES) 0.3 MG tablet Take 0.3 mg by mouth 2 (two) times daily.   10/27/2014  . furosemide (LASIX) 20 MG tablet Take 20 mg by mouth 2 (two) times daily.   10/28/2014  . hydrochlorothiazide (HYDRODIURIL) 25 MG tablet Take 1 tablet (25 mg total) by mouth daily. 30 tablet 0 10/28/2014  . hydroxypropyl methylcellulose / hypromellose (ISOPTO TEARS / GONIOVISC) 2.5 % ophthalmic solution Place 1 drop into both eyes daily as needed for dry eyes.   10/28/2014  . metoprolol (TOPROL-XL) 100 MG 24 hr tablet Take 100 mg by mouth 2 (two) times daily.     10/28/2014 at 0830  . mirtazapine (REMERON) 15 MG tablet Take 15 mg by mouth at bedtime as needed and may repeat dose one time if needed (for sleep).    Past Month  . Multiple  Vitamins-Minerals (MENS MULTIVITAMIN PLUS PO) Take 1 tablet by mouth daily.   10/28/2014  . Oxycodone HCl 20 MG TABS Take 20 mg by mouth every 6 (six) hours as needed (Pain).   0 10/28/2014  . potassium chloride SA (K-DUR,KLOR-CON) 20 MEQ tablet Take 40 mEq by mouth 2 (two) times daily.   10/28/2014  . ranitidine (ZANTAC) 150 MG tablet Take 150 mg by mouth as needed. OTC medication   Past Month   Scheduled: . amLODipine  5 mg Oral Daily  . aspirin  325 mg Oral Daily  . cloNIDine  0.3 mg Oral BID  . famotidine  20 mg Oral Daily  . heparin  5,000 Units Subcutaneous 3 times per day  . hydrochlorothiazide  25 mg Oral Daily  . metoprolol succinate  100 mg Oral BID  . nicotine  14 mg Transdermal Daily  . potassium chloride  10 mEq Intravenous Q1 Hr x 6  . potassium chloride SA  40 mEq Oral BID  . sodium chloride  3 mL Intravenous Q12H    ROS: History obtained from the patient  General ROS: negative for - chills, fatigue, fever, night sweats, weight gain or weight loss Psychological ROS: difficulty thinking for the past month Ophthalmic ROS: negative for - blurry vision, double vision, eye pain or loss of vision ENT ROS: negative for - epistaxis, nasal discharge, oral lesions, sore throat, tinnitus or vertigo Allergy and Immunology ROS: negative for - hives or itchy/watery eyes Hematological and Lymphatic ROS: negative for - bleeding problems, bruising or swollen lymph nodes Endocrine ROS: negative for - galactorrhea, hair pattern changes, polydipsia/polyuria or temperature intolerance Respiratory ROS: negative for - cough, hemoptysis, shortness of breath or wheezing Cardiovascular ROS: negative for - chest pain, dyspnea on exertion, edema or irregular heartbeat Gastrointestinal ROS: negative for - abdominal pain, diarrhea, hematemesis, nausea/vomiting or stool incontinence Genito-Urinary ROS: negative for - dysuria, hematuria, incontinence or urinary frequency/urgency Musculoskeletal ROS:  neck pain, low back pain, toe pain Neurological ROS: as noted in HPI Dermatological ROS: negative for rash and skin lesion changes  Physical Examination: Blood pressure 156/98, pulse 74, temperature 97.5 F (36.4 C), temperature source Oral, resp. rate 18, height 6' (1.829 m), weight 97.7 kg (215 lb 6.2 oz), SpO2 98 %.  HEENT-  Normocephalic, no lesions, without obvious abnormality.  Normal external eye and conjunctiva.  Normal TM's bilaterally.  Normal auditory canals and external ears. Normal external nose, mucus membranes and septum.  Normal pharynx. Cardiovascular- S1, S2 normal, pulses palpable  throughout   Lungs- chest clear, no wheezing, rales, normal symmetric air entry Abdomen- soft, non-tender; bowel sounds normal; no masses,  no organomegaly Extremities- no edema Lymph-no adenopathy palpable Musculoskeletal-no joint tenderness, deformity or swelling Skin-warm and dry, no hyperpigmentation, vitiligo, or suspicious lesions  Neurological Examination Mental Status: Alert, oriented, thought content appropriate.  Speech fluent without evidence of aphasia.  Able to follow 3 step commands without difficulty. Cranial Nerves: II: Discs flat bilaterally; Visual fields grossly normal, pupils equal, round, reactive to light and accommodation III,IV, VI: ptosis not present, extra-ocular motions intact bilaterally V,VII: smile symmetric, facial light touch sensation decreased on the left VIII: hearing normal bilaterally IX,X: gag reflex present XI: bilateral shoulder shrug XII: midline tongue extension Motor: Right : Upper extremity   5/5    Left:     Upper extremity   5/5  Lower extremity   5/5     Lower extremity   5/5 Tone and bulk:normal tone throughout; no atrophy noted Sensory: Pinprick and light touch decreased in the left hand and left lower extremity Deep Tendon Reflexes: 2+ and symmetric with absent AJ's bilaterally Plantars: Right: downgoing   Left:  downgoing Cerebellar: normal finger-to-nose and normal heel-to-shin testing bilaterally Gait: not tested due to safety concerns   Laboratory Studies:  Basic Metabolic Panel:  Recent Labs Lab 10/28/14 1755 10/28/14 1830  NA 134* 135  K 2.3* 2.4*  CL 91* 89*  CO2 33*  --   GLUCOSE 106* 108*  BUN 12 11  CREATININE 0.98 1.00  CALCIUM 9.5  --     Liver Function Tests:  Recent Labs Lab 10/28/14 1755  AST 38*  ALT 36  ALKPHOS 116  BILITOT 0.9  PROT 8.4*  ALBUMIN 4.6   No results for input(s): LIPASE, AMYLASE in the last 168 hours. No results for input(s): AMMONIA in the last 168 hours.  CBC:  Recent Labs Lab 10/28/14 1755 10/28/14 1830  WBC 10.7*  --   NEUTROABS 6.5  --   HGB 16.6 19.0*  HCT 47.0 56.0*  MCV 88.3  --   PLT 382  --     Cardiac Enzymes: No results for input(s): CKTOTAL, CKMB, CKMBINDEX, TROPONINI in the last 168 hours.  BNP: Invalid input(s): POCBNP  CBG:  Recent Labs Lab 10/29/14 0203  GLUCAP 68    Microbiology: No results found for this or any previous visit.  Coagulation Studies:  Recent Labs  10/28/14 1755  LABPROT 13.5  INR 1.02    Urinalysis: No results for input(s): COLORURINE, LABSPEC, PHURINE, GLUCOSEU, HGBUR, BILIRUBINUR, KETONESUR, PROTEINUR, UROBILINOGEN, NITRITE, LEUKOCYTESUR in the last 168 hours.  Invalid input(s): APPERANCEUR  Lipid Panel:    Component Value Date/Time   CHOL 182 03/09/2013 0530   TRIG 121 03/09/2013 0530   HDL 49 03/09/2013 0530   CHOLHDL 3.7 03/09/2013 0530   VLDL 24 03/09/2013 0530   LDLCALC 109* 03/09/2013 0530    HgbA1C: No results found for: HGBA1C  Urine Drug Screen:  No results found for: LABOPIA, COCAINSCRNUR, LABBENZ, AMPHETMU, THCU, LABBARB  Alcohol Level:  Recent Labs Lab 10/28/14 [place] <5    Other results: EKG: normal sinus rhythm at 88 bpm, RBBB.  Imaging: Ct Head Wo Contrast  10/28/2014   CLINICAL DATA:  44 year old male code stroke. Left side weakness.  Initial encounter.  EXAM: CT HEAD WITHOUT CONTRAST  TECHNIQUE: Contiguous axial images were obtained from the base of the skull through the vertex without intravenous contrast.  COMPARISON:  Brain MRI  09/27/2014 and earlier.  FINDINGS: Visualized paranasal sinuses and mastoids are clear. No acute osseous abnormality identified. Visualized orbits and scalp soft tissues are within normal limits.  No midline shift, mass effect, or evidence of intracranial mass lesion. No ventriculomegaly. Gray-white matter differentiation is within normal limits throughout the brain. No evidence of cortically based acute infarction identified. No suspicious intracranial vascular hyperdensity. No acute intracranial hemorrhage identified.  IMPRESSION: Stable and normal noncontrast CT appearance of the brain.  Study discussed by telephone with Dr. Nat Christen on 10/28/2014 at 18:28 .   Electronically Signed   By: Genevie Ann M.D.   On: 10/28/2014 18:31   Mri Brain Without Contrast  10/29/2014   CLINICAL DATA:  LEFT upper extremity weakness and facial numbness beginning yesterday afternoon. Symptoms ascending, similar symptoms in LEFT foot. Weakness. Dizziness. History of coronary artery disease and hypertension.  EXAM: MRI HEAD WITHOUT CONTRAST  MRA HEAD WITHOUT CONTRAST  TECHNIQUE: Multiplanar, multiecho pulse sequences of the brain and surrounding structures were obtained without intravenous contrast. Angiographic images of the head were obtained using MRA technique without contrast.  COMPARISON:  CT of the head October 28, 2014 and MRI of the brain September 27, 2014  FINDINGS: MRI HEAD FINDINGS  The ventricles and sulci are normal for patient's age. No abnormal parenchymal signal, mass lesions, mass effect. No reduced diffusion to suggest acute ischemia. No susceptibility artifact to suggest hemorrhage. Cerebellar tonsils are at but not through the foramen magnum. A few tiny supratentorial white matter FLAIR T2 hyperintensities, within  normal range for patient's age and though nonspecific could represent chronic small vessel ischemic disease.  No abnormal extra-axial fluid collections. No extra-axial masses though, contrast enhanced sequences would be more sensitive. Normal major intracranial vascular flow voids seen at the skull base. Tortuous LEFT vertebral artery again noted to deform the cervical medullary junction.  Ocular globes and orbital contents are unremarkable though not tailored for evaluation. No abnormal sellar expansion. Small LEFT maxillary mucosal retention cysts without paranasal sinus air-fluid level. The mastoid air cells appear well-aerated. No suspicious calvarial bone marrow signal. No abnormal sellar expansion. Craniocervical junction maintained.  MRA HEAD FINDINGS  Anterior circulation: Normal flow related enhancement of the included cervical, petrous, cavernous and supra clinoid internal carotid arteries. Patent anterior communicating artery. Normal flow related enhancement of the anterior and middle cerebral arteries, including more distal segments. Fenestrated proximal LEFT A2 segment area  No large vessel occlusion, high-grade stenosis, abnormal luminal irregularity, aneurysm.  Posterior circulation: LEFT vertebral artery is dominant. Basilar artery is patent, with normal flow related enhancement of the main branch vessels. Diminutive P1 segments with compensatory robust bilateral posterior communicating arteries. Normal flow related enhancement of the posterior cerebral arteries.  No large vessel occlusion, high-grade stenosis, abnormal luminal irregularity, aneurysm.  IMPRESSION: MRI HEAD: No acute intracranial process; normal noncontrast MRI of the brain, stable from September 27, 2014.  MRA HEAD: No acute vascular process, complete circle of Willis.   Electronically Signed   By: Elon Alas   On: 10/29/2014 01:43   Mr Jodene Nam Head/brain Wo Cm  10/29/2014   CLINICAL DATA:  LEFT upper extremity weakness and  facial numbness beginning yesterday afternoon. Symptoms ascending, similar symptoms in LEFT foot. Weakness. Dizziness. History of coronary artery disease and hypertension.  EXAM: MRI HEAD WITHOUT CONTRAST  MRA HEAD WITHOUT CONTRAST  TECHNIQUE: Multiplanar, multiecho pulse sequences of the brain and surrounding structures were obtained without intravenous contrast. Angiographic images of the head were obtained using MRA  technique without contrast.  COMPARISON:  CT of the head October 28, 2014 and MRI of the brain September 27, 2014  FINDINGS: MRI HEAD FINDINGS  The ventricles and sulci are normal for patient's age. No abnormal parenchymal signal, mass lesions, mass effect. No reduced diffusion to suggest acute ischemia. No susceptibility artifact to suggest hemorrhage. Cerebellar tonsils are at but not through the foramen magnum. A few tiny supratentorial white matter FLAIR T2 hyperintensities, within normal range for patient's age and though nonspecific could represent chronic small vessel ischemic disease.  No abnormal extra-axial fluid collections. No extra-axial masses though, contrast enhanced sequences would be more sensitive. Normal major intracranial vascular flow voids seen at the skull base. Tortuous LEFT vertebral artery again noted to deform the cervical medullary junction.  Ocular globes and orbital contents are unremarkable though not tailored for evaluation. No abnormal sellar expansion. Small LEFT maxillary mucosal retention cysts without paranasal sinus air-fluid level. The mastoid air cells appear well-aerated. No suspicious calvarial bone marrow signal. No abnormal sellar expansion. Craniocervical junction maintained.  MRA HEAD FINDINGS  Anterior circulation: Normal flow related enhancement of the included cervical, petrous, cavernous and supra clinoid internal carotid arteries. Patent anterior communicating artery. Normal flow related enhancement of the anterior and middle cerebral arteries,  including more distal segments. Fenestrated proximal LEFT A2 segment area  No large vessel occlusion, high-grade stenosis, abnormal luminal irregularity, aneurysm.  Posterior circulation: LEFT vertebral artery is dominant. Basilar artery is patent, with normal flow related enhancement of the main branch vessels. Diminutive P1 segments with compensatory robust bilateral posterior communicating arteries. Normal flow related enhancement of the posterior cerebral arteries.  No large vessel occlusion, high-grade stenosis, abnormal luminal irregularity, aneurysm.  IMPRESSION: MRI HEAD: No acute intracranial process; normal noncontrast MRI of the brain, stable from September 27, 2014.  MRA HEAD: No acute vascular process, complete circle of Willis.   Electronically Signed   By: Elon Alas   On: 10/29/2014 01:43    Assessment: 44 y.o. male with a history of cardiac issues and polycythemia s/p recent phlebotomy who presents with left sided numbness.  Neurological examination otherwise unremarkable.  Head CT and MRI of the brain personally reviewed and shows no acute changes.  Patient does have vascular risk factors.  Current hct normal.  Can not rule out a MR negative infarct.  Further work up recommended.    Stroke Risk Factors - hypertension and smoking  Plan: 1. HgbA1c, fasting lipid panel 2. PT consult, OT consult 3. Echocardiogram 4. Carotid dopplers 5. Prophylactic therapy-Continue ASA 6. NPO until RN stroke swallow screen 7. Telemetry monitoring 8. Frequent neuro checks 9. EEG 10. Smoking cessation [place], MD [facility] [phone] 10/29/2014, 3:00 AM

## 2014-10-30 ENCOUNTER — Inpatient Hospital Stay (HOSPITAL_COMMUNITY): Payer: Medicare Other

## 2014-10-30 DIAGNOSIS — I2510 Atherosclerotic heart disease of native coronary artery without angina pectoris: Secondary | ICD-10-CM

## 2014-10-30 DIAGNOSIS — I10 Essential (primary) hypertension: Secondary | ICD-10-CM

## 2014-10-30 DIAGNOSIS — G459 Transient cerebral ischemic attack, unspecified: Secondary | ICD-10-CM

## 2014-10-30 LAB — CBC
HCT: 42.9 % (ref 39.0–52.0)
HEMOGLOBIN: 14.2 g/dL (ref 13.0–17.0)
MCH: 29.3 pg (ref 26.0–34.0)
MCHC: 33.1 g/dL (ref 30.0–36.0)
MCV: 88.5 fL (ref 78.0–100.0)
Platelets: 347 10*3/uL (ref 150–400)
RBC: 4.85 MIL/uL (ref 4.22–5.81)
RDW: 13.5 % (ref 11.5–15.5)
WBC: 7.8 10*3/uL (ref 4.0–10.5)

## 2014-10-30 LAB — BASIC METABOLIC PANEL
Anion gap: 4 — ABNORMAL LOW (ref 5–15)
BUN: 6 mg/dL (ref 6–23)
CALCIUM: 9.1 mg/dL (ref 8.4–10.5)
CO2: 29 mmol/L (ref 19–32)
Chloride: 100 mmol/L (ref 96–112)
Creatinine, Ser: 0.85 mg/dL (ref 0.50–1.35)
Glucose, Bld: 93 mg/dL (ref 70–99)
POTASSIUM: 3.8 mmol/L (ref 3.5–5.1)
SODIUM: 133 mmol/L — AB (ref 135–145)

## 2014-10-30 LAB — MAGNESIUM: MAGNESIUM: 2.1 mg/dL (ref 1.5–2.5)

## 2014-10-30 LAB — HEMOGLOBIN A1C
Hgb A1c MFr Bld: 5 % (ref 4.8–5.6)
MEAN PLASMA GLUCOSE: 97 mg/dL

## 2014-10-30 MED ORDER — CLONIDINE HCL 0.2 MG PO TABS: 0.2000 mg | ORAL_TABLET | Freq: Two times a day (BID) | ORAL | Status: DC

## 2014-10-30 MED ORDER — ATORVASTATIN CALCIUM 10 MG PO TABS: 10.0000 mg | ORAL_TABLET | Freq: Every day | ORAL | Status: DC

## 2014-10-30 MED ORDER — AMLODIPINE BESYLATE 10 MG PO TABS: 10.0000 mg | ORAL_TABLET | Freq: Every day | ORAL | Status: DC

## 2014-10-30 MED ORDER — POTASSIUM CHLORIDE CRYS ER 20 MEQ PO TBCR
20.0000 meq | EXTENDED_RELEASE_TABLET | Freq: Every day | ORAL | Status: DC
  Administered 2014-10-30: 20 meq via ORAL
  Filled 2014-10-30: qty 1

## 2014-10-30 MED ORDER — ASPIRIN 325 MG PO TABS: 325.0000 mg | ORAL_TABLET | Freq: Every day | ORAL | Status: DC

## 2014-10-30 MED ORDER — HYDRALAZINE HCL 25 MG PO TABS: 25.0000 mg | ORAL_TABLET | Freq: Three times a day (TID) | ORAL | Status: DC

## 2014-10-30 MED ORDER — POTASSIUM CHLORIDE CRYS ER 20 MEQ PO TBCR: 20.0000 meq | EXTENDED_RELEASE_TABLET | Freq: Every day | ORAL | Status: DC

## 2014-10-30 MED ORDER — CLONIDINE HCL 0.1 MG PO TABS
0.2000 mg | ORAL_TABLET | Freq: Two times a day (BID) | ORAL | Status: DC
Start: 2014-10-30 — End: 2014-10-30
  Administered 2014-10-30: 0.2 mg via ORAL
  Filled 2014-10-30: qty 2

## 2014-10-30 NOTE — Progress Notes (Signed)
CARE MANAGEMENT NOTE 10/30/2014  Patient:  Timothy [name]   Account Number:  [id number]  Date Initiated:  10/30/2014  Documentation initiated by:  Olga Coaster  Subjective/Objective Assessment:   ADMITTED FOR STROKE WORKUP     Action/Plan:   CM FOLLOWING FOR DCP   Anticipated DC Date:  10/31/2014   Anticipated DC Plan:  [place]  CM consult        Status of service:   Medicare Important Message given?   (If response is "NO", the following Medicare IM given date fields will be blank)  Per UR Regulation:  Reviewed for med. necessity/level of care/duration of stay  Comments:  2/15/2016Mindi Slicker RN,BSN,MHA [phone]

## 2014-10-30 NOTE — Progress Notes (Signed)
Nutrition Brief Note  Patient identified on the Malnutrition Screening Tool (MST) Report  Wt Readings from Last 15 Encounters:  10/28/14 215 lb 6.2 oz (97.7 kg)  10/03/14 225 lb (102.059 kg)  09/27/14 220 lb (99.791 kg)  09/27/14 220 lb (99.791 kg)  09/27/14 220 lb (99.791 kg)  09/19/14 218 lb 8 oz (99.111 kg)  05/03/14 229 lb (103.874 kg)  07/13/13 229 lb 3.2 oz (103.964 kg)  06/22/13 230 lb 3.2 oz (104.418 kg)  05/12/13 233 lb (105.688 kg)  05/03/13 225 lb (102.059 kg)  03/08/13 215 lb (97.523 kg)  03/03/13 218 lb (98.884 kg)  08/11/12 224 lb (101.606 kg)  05/30/11 187 lb (84.823 kg)   44 y.o. male with a history of cardiac issues and polycythemia s/p recent phlebotomy who presents with left sided numbness. Neurological examination otherwise unremarkable. Head CT and MRI of the brain personally reviewed and shows no acute changes.Can not rule out a MR negative infarct.  SLP completed BSE which revealed no signs opf aspiration. Pt was advanced to a Heart Healthy, regular consistency diet with thin liquids. Pt is tolerating diet well.   Noted UBW fluctuates between 215-230#. Weight changes within the past year are not significant for time frame. Pt was on lasix PTA, which is likely a contributing factor to weight fluctuations.   Body mass index is 29.21 kg/(m^2). Patient meets criteria for overweight based on current BMI.   Current diet order is Heart Healthy, patient is consuming approximately 75% of meals at this time. Labs and medications reviewed.   No nutrition interventions warranted at this time. If nutrition issues arise, please consult RD.   Rito Lecomte A. Jimmye Norman, RD, LDN, CDE Pager: [phone] After hours Pager: [phone]

## 2014-10-30 NOTE — Progress Notes (Signed)
Discharge orders received. Pt for discharge home today. IV d/c'd. Pt given discharge instructions and prescriptions with verbalized understanding. Family in room to assist with discharge. Staff brought pt downstairs via wheelchair.   

## 2014-10-30 NOTE — Discharge Summary (Signed)
Physician Discharge Summary  Patient ID: Timothy [name]. MRN: [id number] DOB/AGE: 15-Aug-1971 44 y.o.  Admit date: 10/28/2014 Discharge date: 10/30/2014  Primary Care Physician:  Glo Herring., MD  Discharge Diagnoses:    . TIA . Uncontrolled hypertension . Hypokalemia . Dehydration . GERD (gastroesophageal reflux disease) . CAD (coronary artery disease) . Smoking . Polycythemia  Consults:  Neurology   Recommendations for Outpatient Follow-up:  Please note lasix and HCTZ have been discontinued.   TESTS THAT NEED FOLLOW-UP BMET for K level   DIET: Heart healthy diet  Allergies:   Allergies  Allergen Reactions  . Morphine Other (See Comments)    Makes overly sleepy     Discharge Medications:   Medication List    STOP taking these medications        furosemide 20 MG tablet  Commonly known as:  LASIX     hydrochlorothiazide 25 MG tablet  Commonly known as:  HYDRODIURIL      TAKE these medications        acetaminophen 500 MG tablet  Commonly known as:  TYLENOL  Take 500-1,500 mg by mouth daily as needed for pain.     amLODipine 10 MG tablet  Commonly known as:  NORVASC  Take 1 tablet (10 mg total) by mouth daily.     aspirin 325 MG tablet  Take 1 tablet (325 mg total) by mouth daily.     atorvastatin 10 MG tablet  Commonly known as:  LIPITOR  Take 1 tablet (10 mg total) by mouth at bedtime.     cloNIDine 0.2 MG tablet  Commonly known as:  CATAPRES  Take 1 tablet (0.2 mg total) by mouth 2 (two) times daily.     hydrALAZINE 25 MG tablet  Commonly known as:  APRESOLINE  Take 1 tablet (25 mg total) by mouth 3 (three) times daily.     hydroxypropyl methylcellulose / hypromellose 2.5 % ophthalmic solution  Commonly known as:  ISOPTO TEARS / GONIOVISC  Place 1 drop into both eyes daily as needed for dry eyes.     MENS MULTIVITAMIN PLUS PO  Take 1 tablet by mouth daily.     metoprolol succinate 100 MG 24 hr tablet  Commonly known as:   TOPROL-XL  Take 100 mg by mouth 2 (two) times daily.     mirtazapine 15 MG tablet  Commonly known as:  REMERON  Take 15 mg by mouth at bedtime as needed and may repeat dose one time if needed (for sleep).     Oxycodone HCl 20 MG Tabs  Take 20 mg by mouth every 6 (six) hours as needed (Pain).     potassium chloride SA 20 MEQ tablet  Commonly known as:  K-DUR,KLOR-CON  Take 1 tablet (20 mEq total) by mouth daily. x3days     ranitidine 150 MG tablet  Commonly known as:  ZANTAC  Take 150 mg by mouth as needed. OTC medication         Brief H and P: For complete details please refer to admission H and P, but in briefPatient is a 44 year old male with CAD, GERD, WPW syndrome, palpitations presented with Left upper extremity lower extremity and facial numbness. Started at 1300 on the day of admission in his fingertips as a pinprick or tingling sensation. Patient said it felt like it was "asleep". Symptoms progressed proximally up his arm. Similar sensation that started in his left toes and foot. The symptoms then progressed to weakness. Left facial numbness associated  with left upper and lower lips. Associated with dizziness. Denied difficulty swallowing syncope, headache, chest pain, fever, abdominal pain, nausea vomiting diarrhea. Intermittent palpitations which are normal per patient. Patient was found to have hypokalemia with potassium of 2.3 at the time of admission, on HCTZ and Lasix at home.  Hospital Course:   Left sided numbness: Likely TIA - MRI of the brain showed no acute intracranial process - MRA showed no acute vascular process, complete circle of Willis - 2-D echo showed EF 50-55%, mild mitral regurgitation - carotid Doppler showed 1-39% ICA stenosis bilaterally - EEG was normal - Patient was started on aspirin 325mg  daily  - PT/OT/ST showed no deficits, patient back to his baseline - Lipid panel showed LDL 129, started on lipitor - hemoglobin A1c5.0  Active  Problems: Hypokalemia - Recheck potassium this morning, CMET pending - Discontinued Lasix, patient denies history of CHF or peripheral edema, reports that Lasix was started for hypertension. He was oral potassium 61meq BID, HCTZ was discontinued. Patient's potassium significant improved after Lasix and HCTZ were discontinued. Patient was recommended to continue potassium 20 mainly: Daily for 3 days, then have the labs checked with his PCP after 3 days.   Accelerated hypertension - Restarted Norvasc, clonidine, Toprol-XL, added hydralazine oral. Patient had good response to hydralazine. I have tapered his clonidine to 0.2 mg twice a day.    Dehydration with polycythemia - Hemoglobin 19 on admission, patient had recent phlebotomy of 500 mL on 10/03/14, following oncology outpatient workup.  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 Follow up outpatient with cardiology  Nicotine use Patient counseled strongly on tobacco cessation.   GERD  Continue PPI  Day of Discharge BP 101/57 mmHg  Pulse 55  Temp(Src) 97.5 F (36.4 C) (Oral)  Resp 20  Ht 6' (1.829 m)  Wt 97.7 kg (215 lb 6.2 oz)  BMI 29.21 kg/m2  SpO2 100%  Physical Exam: General: Alert and awake oriented x3 not in any acute distress. HEENT: anicteric sclera, pupils reactive to light and accommodation CVS: S1-S2 clear no murmur rubs or gallops Chest: clear to auscultation bilaterally, no wheezing rales or rhonchi Abdomen: soft nontender, nondistended, normal bowel sounds Extremities: no cyanosis, clubbing or edema noted bilaterally Neuro: Cranial nerves II-XII intact, no focal neurological deficits   The results of significant diagnostics from this hospitalization (including imaging, microbiology, ancillary and laboratory) are listed below for reference.    LAB RESULTS: Basic Metabolic Panel:  Recent Labs Lab 10/29/14 0858 10/29/14 1624  10/30/14 0608  NA 130*  --  133*  K 2.7* 3.6 3.8  CL 93*  --  100  CO2 28  --  29  GLUCOSE 81  --  93  BUN 7  --  6  CREATININE 0.91  --  0.85  CALCIUM 9.0  --  9.1  MG 2.0  --  2.1   Liver Function Tests:  Recent Labs Lab 10/28/14 1755 10/29/14 0858  AST 38* 30  ALT 36 27  ALKPHOS 116 96  BILITOT 0.9 1.9*  PROT 8.4* 6.3  ALBUMIN 4.6 3.2*   No results for input(s): LIPASE, AMYLASE in the last 168 hours. No results for input(s): AMMONIA in the last 168 hours. CBC:  Recent Labs Lab 10/28/14 1755  10/29/14 0858 10/30/14 0608  WBC 10.7*  --  7.2 7.8  NEUTROABS 6.5  --   --   --   HGB 16.6  < >  14.2 14.2  HCT 47.0  < > 42.0 42.9  MCV 88.3  --  87.1 88.5  PLT 382  --  343 347  < > = values in this interval not displayed. Cardiac Enzymes: No results for input(s): CKTOTAL, CKMB, CKMBINDEX, TROPONINI in the last 168 hours. BNP: Invalid input(s): POCBNP CBG:  Recent Labs Lab 10/29/14 1619 10/29/14 2009  GLUCAP 102* 102*    Significant Diagnostic Studies:  Ct Head Wo Contrast  10/28/2014   CLINICAL DATA:  44 year old male code stroke. Left side weakness. Initial encounter.  EXAM: CT HEAD WITHOUT CONTRAST  TECHNIQUE: Contiguous axial images were obtained from the base of the skull through the vertex without intravenous contrast.  COMPARISON:  Brain MRI 09/27/2014 and earlier.  FINDINGS: Visualized paranasal sinuses and mastoids are clear. No acute osseous abnormality identified. Visualized orbits and scalp soft tissues are within normal limits.  No midline shift, mass effect, or evidence of intracranial mass lesion. No ventriculomegaly. Gray-white matter differentiation is within normal limits throughout the brain. No evidence of cortically based acute infarction identified. No suspicious intracranial vascular hyperdensity. No acute intracranial hemorrhage identified.  IMPRESSION: Stable and normal noncontrast CT appearance of the brain.  Study discussed by telephone with  Dr. Nat Christen on 10/28/2014 at 18:28 .   Electronically Signed   By: Genevie Ann M.D.   On: 10/28/2014 18:31   Mri Brain Without Contrast  10/29/2014   CLINICAL DATA:  LEFT upper extremity weakness and facial numbness beginning yesterday afternoon. Symptoms ascending, similar symptoms in LEFT foot. Weakness. Dizziness. History of coronary artery disease and hypertension.  EXAM: MRI HEAD WITHOUT CONTRAST  MRA HEAD WITHOUT CONTRAST  TECHNIQUE: Multiplanar, multiecho pulse sequences of the brain and surrounding structures were obtained without intravenous contrast. Angiographic images of the head were obtained using MRA technique without contrast.  COMPARISON:  CT of the head October 28, 2014 and MRI of the brain September 27, 2014  FINDINGS: MRI HEAD FINDINGS  The ventricles and sulci are normal for patient's age. No abnormal parenchymal signal, mass lesions, mass effect. No reduced diffusion to suggest acute ischemia. No susceptibility artifact to suggest hemorrhage. Cerebellar tonsils are at but not through the foramen magnum. A few tiny supratentorial white matter FLAIR T2 hyperintensities, within normal range for patient's age and though nonspecific could represent chronic small vessel ischemic disease.  No abnormal extra-axial fluid collections. No extra-axial masses though, contrast enhanced sequences would be more sensitive. Normal major intracranial vascular flow voids seen at the skull base. Tortuous LEFT vertebral artery again noted to deform the cervical medullary junction.  Ocular globes and orbital contents are unremarkable though not tailored for evaluation. No abnormal sellar expansion. Small LEFT maxillary mucosal retention cysts without paranasal sinus air-fluid level. The mastoid air cells appear well-aerated. No suspicious calvarial bone marrow signal. No abnormal sellar expansion. Craniocervical junction maintained.  MRA HEAD FINDINGS  Anterior circulation: Normal flow related enhancement of the  included cervical, petrous, cavernous and supra clinoid internal carotid arteries. Patent anterior communicating artery. Normal flow related enhancement of the anterior and middle cerebral arteries, including more distal segments. Fenestrated proximal LEFT A2 segment area  No large vessel occlusion, high-grade stenosis, abnormal luminal irregularity, aneurysm.  Posterior circulation: LEFT vertebral artery is dominant. Basilar artery is patent, with normal flow related enhancement of the main branch vessels. Diminutive P1 segments with compensatory robust bilateral posterior communicating arteries. Normal flow related enhancement of the posterior cerebral arteries.  No large vessel occlusion, high-grade stenosis, abnormal  luminal irregularity, aneurysm.  IMPRESSION: MRI HEAD: No acute intracranial process; normal noncontrast MRI of the brain, stable from September 27, 2014.  MRA HEAD: No acute vascular process, complete circle of Willis.   Electronically Signed   By: Elon Alas   On: 10/29/2014 01:43   Mr Jodene Nam Head/brain Wo Cm  10/29/2014   CLINICAL DATA:  LEFT upper extremity weakness and facial numbness beginning yesterday afternoon. Symptoms ascending, similar symptoms in LEFT foot. Weakness. Dizziness. History of coronary artery disease and hypertension.  EXAM: MRI HEAD WITHOUT CONTRAST  MRA HEAD WITHOUT CONTRAST  TECHNIQUE: Multiplanar, multiecho pulse sequences of the brain and surrounding structures were obtained without intravenous contrast. Angiographic images of the head were obtained using MRA technique without contrast.  COMPARISON:  CT of the head October 28, 2014 and MRI of the brain September 27, 2014  FINDINGS: MRI HEAD FINDINGS  The ventricles and sulci are normal for patient's age. No abnormal parenchymal signal, mass lesions, mass effect. No reduced diffusion to suggest acute ischemia. No susceptibility artifact to suggest hemorrhage. Cerebellar tonsils are at but not through the foramen  magnum. A few tiny supratentorial white matter FLAIR T2 hyperintensities, within normal range for patient's age and though nonspecific could represent chronic small vessel ischemic disease.  No abnormal extra-axial fluid collections. No extra-axial masses though, contrast enhanced sequences would be more sensitive. Normal major intracranial vascular flow voids seen at the skull base. Tortuous LEFT vertebral artery again noted to deform the cervical medullary junction.  Ocular globes and orbital contents are unremarkable though not tailored for evaluation. No abnormal sellar expansion. Small LEFT maxillary mucosal retention cysts without paranasal sinus air-fluid level. The mastoid air cells appear well-aerated. No suspicious calvarial bone marrow signal. No abnormal sellar expansion. Craniocervical junction maintained.  MRA HEAD FINDINGS  Anterior circulation: Normal flow related enhancement of the included cervical, petrous, cavernous and supra clinoid internal carotid arteries. Patent anterior communicating artery. Normal flow related enhancement of the anterior and middle cerebral arteries, including more distal segments. Fenestrated proximal LEFT A2 segment area  No large vessel occlusion, high-grade stenosis, abnormal luminal irregularity, aneurysm.  Posterior circulation: LEFT vertebral artery is dominant. Basilar artery is patent, with normal flow related enhancement of the main branch vessels. Diminutive P1 segments with compensatory robust bilateral posterior communicating arteries. Normal flow related enhancement of the posterior cerebral arteries.  No large vessel occlusion, high-grade stenosis, abnormal luminal irregularity, aneurysm.  IMPRESSION: MRI HEAD: No acute intracranial process; normal noncontrast MRI of the brain, stable from September 27, 2014.  MRA HEAD: No acute vascular process, complete circle of Willis.   Electronically Signed   By: Elon Alas   On: 10/29/2014 01:43    2D  ECHO: Study Conclusions  - Left ventricle: The cavity size was normal. Wall thickness was increased in a pattern of mild LVH. Systolic function was normal. The estimated ejection fraction was in the range of 50% to 55%. - Mitral valve: There was mild regurgitation.  Disposition and Follow-up: Discharge Instructions    Diet - low sodium heart healthy    Complete by:  As directed      Discharge instructions    Complete by:  As directed   Please note that Potassium is decreased to 81meq daily for 3 days. Then please get your repeat labs checked with your primary physician on Thursday/friday this week. You may not need to take any more potassium.  Please STOP hydrochlorothiazide and lasix.     Increase activity slowly  Complete by:  As directed             DISPOSITION: home    DISCHARGE FOLLOW-UP Follow-up Information    Follow up with Glo Herring., MD. Schedule an appointment as soon as possible for a visit in 1 week.   Specialty:  Internal Medicine   Why:  for hospital follow-up, obtain labs for potassium level   Contact information:   [address] [place] [address] [phone]       Follow up with SETHI,PRAMOD, MD. Schedule an appointment as soon as possible for a visit in 1 month.   Specialties:  Neurology, Radiology   Why:  for hospital follow-up   Contact information:   [address] [place] [address] [phone]        Time spent on Discharge: 40 mins  Signed:   Aden Youngman M.D. [facility] 10/30/2014, 4:34 PM Pager: [phone]

## 2014-10-30 NOTE — Progress Notes (Signed)
  Echocardiogram 2D Echocardiogram has been performed.  Landry Mellow, RDMS, RVT  10/30/2014, 1:49 PM

## 2014-10-30 NOTE — Procedures (Signed)
EEG report.  Brief clinical history:  44 y.o. male with a history of cardiac issues and polycythemia s/p recent phlebotomy who presents with left sided numbness.  Technique: this is a 17 channel routine scalp EEG performed at the bedside with bipolar and monopolar montages arranged in accordance to the international 10/20 system of electrode placement. One channel was dedicated to EKG recording.  The study was performed during wakefulness, drowsiness, and stage 2 sleep. Intermittent photic stimulation was the sole activating procedure utilized.  Description:In the wakeful state, the best background consisted of a medium amplitude, posterior dominant, well sustained, symmetric and reactive 10 Hz rhythm. Drowsiness demonstrated dropout of the alpha rhythm. Stage 2 sleep showed symmetric and synchronous sleep spindles without intermixed epileptiform discharges. Intermittent photic stimulation did induce a normal driving response.  No focal or generalized epileptiform discharges noted.  No pathologic areas of slowing seen.  EKG showed sinus rhythm.  Impression: this is a normal awake and asleep EEG. Please, be aware that a normal EEG does not exclude the possibility of epilepsy.  Clinical correlation is advised.   Dorian Pod, MD

## 2014-10-30 NOTE — Progress Notes (Signed)
OT Cancellation Note  Patient Details Name: Timothy [name]. MRN: [id number] DOB: [birth year]   Cancelled Treatment:    Reason Eval/Treat Not Completed: OT screened, no needs identified, will sign off - Pt reports numbness/tingling improving Lt hand and now is isolated to tingling his fingertips.  He denies coordination deficits or difficulties with ADLs.    [name], OTR/L [phone]  10/30/2014, 11:57 AM

## 2014-10-30 NOTE — Progress Notes (Signed)
VASCULAR LAB PRELIMINARY  PRELIMINARY  PRELIMINARY  PRELIMINARY  Carotid duplex  completed.    Preliminary report:  Bilateral:  1-39% ICA stenosis.  Vertebral artery flow is antegrade.      Maylee Bare, RVT 10/30/2014, 11:20 AM

## 2014-10-30 NOTE — Progress Notes (Signed)
Subjective: Patient still has some decreased sensation in the tips of his fingers on the left hand and the top of his foot on the left foot.  No other complaint.   Objective: Current vital signs: BP 118/65 mmHg  Pulse 52  Temp(Src) 97.7 F (36.5 C) (Oral)  Resp 20  Ht 6' (1.829 m)  Wt 97.7 kg (215 lb 6.2 oz)  BMI 29.21 kg/m2  SpO2 99% Vital signs in last 24 hours: Temp:  [97.7 F (36.5 C)-98.7 F (37.1 C)] 97.7 F (36.5 C) (02/15 0500) Pulse Rate:  [52-61] 52 (02/15 0500) Resp:  [16-20] 20 (02/15 0500) BP: (92-124)/(65-81) 118/65 mmHg (02/15 0500) SpO2:  [96 %-100 %] 99 % (02/15 0500)  Intake/Output from previous day: 02/14 0701 - 02/15 0700 In: 363 [P.O.:360; I.V.:3] Out: -  Intake/Output this shift:   Nutritional status: Diet Heart  Neurologic Exam:  Mental Status: Alert, oriented, thought content appropriate.  Speech fluent without evidence of aphasia.  Able to follow 3 step commands without difficulty. Cranial Nerves: II: Visual fields grossly normal, pupils equal, round, reactive to light and accommodation III,IV, VI: ptosis not present, extra-ocular motions intact bilaterally V,VII: smile symmetric, facial light touch sensation normal bilaterally VIII: hearing normal bilaterally IX,X: gag reflex present XI: bilateral shoulder shrug XII: midline tongue extension without atrophy or fasciculations  Motor: Right : Upper extremity   5/5    Left:     Upper extremity   5/5  Lower extremity   5/5     Lower extremity   5/5 Tone and bulk:normal tone throughout; no atrophy noted Sensory: Pinprick and light touch decreased on the tips of his fingers on the left hand and toes of his left foot.  Deep Tendon Reflexes:  Right: Upper Extremity   Left: Upper extremity   biceps (C-5 to C-6) 2/4   biceps (C-5 to C-6) 2/4 tricep (C7) 2/4    triceps (C7) 2/4 Brachioradialis (C6) 2/4  Brachioradialis (C6) 2/4  Lower Extremity Lower Extremity  quadriceps (L-2 to L-4) 2/4    quadriceps (L-2 to L-4) 2/4 Achilles (S1) 2/4   Achilles (S1) 2/4  Plantars: Right: downgoing   Left: downgoing Cerebellar: normal finger-to-nose,  normal heel-to-shin test     Lab Results: Basic Metabolic Panel:  Recent Labs Lab 10/28/14 1755 10/28/14 1830 10/29/14 0858 10/29/14 1624 10/30/14 0608  NA 134* 135 130*  --  133*  K 2.3* 2.4* 2.7* 3.6 3.8  CL 91* 89* 93*  --  100  CO2 33*  --  28  --  29  GLUCOSE 106* 108* 81  --  93  BUN 12 11 7   --  6  CREATININE 0.98 1.00 0.91  --  0.85  CALCIUM 9.5  --  9.0  --  9.1  MG  --   --  2.0  --  2.1    Liver Function Tests:  Recent Labs Lab 10/28/14 1755 10/29/14 0858  AST 38* 30  ALT 36 27  ALKPHOS 116 96  BILITOT 0.9 1.9*  PROT 8.4* 6.3  ALBUMIN 4.6 3.2*   No results for input(s): LIPASE, AMYLASE in the last 168 hours. No results for input(s): AMMONIA in the last 168 hours.  CBC:  Recent Labs Lab 10/28/14 1755 10/28/14 1830 10/29/14 0858 10/30/14 0608  WBC 10.7*  --  7.2 7.8  NEUTROABS 6.5  --   --   --   HGB 16.6 19.0* 14.2 14.2  HCT 47.0 56.0* 42.0 42.9  MCV 88.3  --  87.1 88.5  PLT 382  --  343 347    Cardiac Enzymes: No results for input(s): CKTOTAL, CKMB, CKMBINDEX, TROPONINI in the last 168 hours.  Lipid Panel:  Recent Labs Lab 10/29/14 0858  CHOL 187  TRIG 102  HDL 38*  CHOLHDL 4.9  VLDL 20  LDLCALC 129*    CBG:  Recent Labs Lab 10/29/14 0417 10/29/14 0814 10/29/14 1147 10/29/14 1619 10/29/14 2009  GLUCAP 83 86 111* 102* 102*    Microbiology: No results found for this or any previous visit.  Coagulation Studies:  Recent Labs  10/28/14 1755  LABPROT 13.5  INR 1.02    Imaging: Ct Head Wo Contrast  10/28/2014   CLINICAL DATA:  44 year old male code stroke. Left side weakness. Initial encounter.  EXAM: CT HEAD WITHOUT CONTRAST  TECHNIQUE: Contiguous axial images were obtained from the base of the skull through the vertex without intravenous contrast.  COMPARISON:   Brain MRI 09/27/2014 and earlier.  FINDINGS: Visualized paranasal sinuses and mastoids are clear. No acute osseous abnormality identified. Visualized orbits and scalp soft tissues are within normal limits.  No midline shift, mass effect, or evidence of intracranial mass lesion. No ventriculomegaly. Gray-white matter differentiation is within normal limits throughout the brain. No evidence of cortically based acute infarction identified. No suspicious intracranial vascular hyperdensity. No acute intracranial hemorrhage identified.  IMPRESSION: Stable and normal noncontrast CT appearance of the brain.  Study discussed by telephone with Dr. Nat Christen on 10/28/2014 at 18:28 .   Electronically Signed   By: Genevie Ann M.D.   On: 10/28/2014 18:31   Mri Brain Without Contrast  10/29/2014   CLINICAL DATA:  LEFT upper extremity weakness and facial numbness beginning yesterday afternoon. Symptoms ascending, similar symptoms in LEFT foot. Weakness. Dizziness. History of coronary artery disease and hypertension.  EXAM: MRI HEAD WITHOUT CONTRAST  MRA HEAD WITHOUT CONTRAST  TECHNIQUE: Multiplanar, multiecho pulse sequences of the brain and surrounding structures were obtained without intravenous contrast. Angiographic images of the head were obtained using MRA technique without contrast.  COMPARISON:  CT of the head October 28, 2014 and MRI of the brain September 27, 2014  FINDINGS: MRI HEAD FINDINGS  The ventricles and sulci are normal for patient's age. No abnormal parenchymal signal, mass lesions, mass effect. No reduced diffusion to suggest acute ischemia. No susceptibility artifact to suggest hemorrhage. Cerebellar tonsils are at but not through the foramen magnum. A few tiny supratentorial white matter FLAIR T2 hyperintensities, within normal range for patient's age and though nonspecific could represent chronic small vessel ischemic disease.  No abnormal extra-axial fluid collections. No extra-axial masses though, contrast  enhanced sequences would be more sensitive. Normal major intracranial vascular flow voids seen at the skull base. Tortuous LEFT vertebral artery again noted to deform the cervical medullary junction.  Ocular globes and orbital contents are unremarkable though not tailored for evaluation. No abnormal sellar expansion. Small LEFT maxillary mucosal retention cysts without paranasal sinus air-fluid level. The mastoid air cells appear well-aerated. No suspicious calvarial bone marrow signal. No abnormal sellar expansion. Craniocervical junction maintained.  MRA HEAD FINDINGS  Anterior circulation: Normal flow related enhancement of the included cervical, petrous, cavernous and supra clinoid internal carotid arteries. Patent anterior communicating artery. Normal flow related enhancement of the anterior and middle cerebral arteries, including more distal segments. Fenestrated proximal LEFT A2 segment area  No large vessel occlusion, high-grade stenosis, abnormal luminal irregularity, aneurysm.  Posterior circulation: LEFT vertebral artery is dominant. Basilar artery is patent,  with normal flow related enhancement of the main branch vessels. Diminutive P1 segments with compensatory robust bilateral posterior communicating arteries. Normal flow related enhancement of the posterior cerebral arteries.  No large vessel occlusion, high-grade stenosis, abnormal luminal irregularity, aneurysm.  IMPRESSION: MRI HEAD: No acute intracranial process; normal noncontrast MRI of the brain, stable from September 27, 2014.  MRA HEAD: No acute vascular process, complete circle of Willis.   Electronically Signed   By: Elon Alas   On: 10/29/2014 01:43   Mr Jodene Nam Head/brain Wo Cm  10/29/2014   CLINICAL DATA:  LEFT upper extremity weakness and facial numbness beginning yesterday afternoon. Symptoms ascending, similar symptoms in LEFT foot. Weakness. Dizziness. History of coronary artery disease and hypertension.  EXAM: MRI HEAD WITHOUT  CONTRAST  MRA HEAD WITHOUT CONTRAST  TECHNIQUE: Multiplanar, multiecho pulse sequences of the brain and surrounding structures were obtained without intravenous contrast. Angiographic images of the head were obtained using MRA technique without contrast.  COMPARISON:  CT of the head October 28, 2014 and MRI of the brain September 27, 2014  FINDINGS: MRI HEAD FINDINGS  The ventricles and sulci are normal for patient's age. No abnormal parenchymal signal, mass lesions, mass effect. No reduced diffusion to suggest acute ischemia. No susceptibility artifact to suggest hemorrhage. Cerebellar tonsils are at but not through the foramen magnum. A few tiny supratentorial white matter FLAIR T2 hyperintensities, within normal range for patient's age and though nonspecific could represent chronic small vessel ischemic disease.  No abnormal extra-axial fluid collections. No extra-axial masses though, contrast enhanced sequences would be more sensitive. Normal major intracranial vascular flow voids seen at the skull base. Tortuous LEFT vertebral artery again noted to deform the cervical medullary junction.  Ocular globes and orbital contents are unremarkable though not tailored for evaluation. No abnormal sellar expansion. Small LEFT maxillary mucosal retention cysts without paranasal sinus air-fluid level. The mastoid air cells appear well-aerated. No suspicious calvarial bone marrow signal. No abnormal sellar expansion. Craniocervical junction maintained.  MRA HEAD FINDINGS  Anterior circulation: Normal flow related enhancement of the included cervical, petrous, cavernous and supra clinoid internal carotid arteries. Patent anterior communicating artery. Normal flow related enhancement of the anterior and middle cerebral arteries, including more distal segments. Fenestrated proximal LEFT A2 segment area  No large vessel occlusion, high-grade stenosis, abnormal luminal irregularity, aneurysm.  Posterior circulation: LEFT vertebral  artery is dominant. Basilar artery is patent, with normal flow related enhancement of the main branch vessels. Diminutive P1 segments with compensatory robust bilateral posterior communicating arteries. Normal flow related enhancement of the posterior cerebral arteries.  No large vessel occlusion, high-grade stenosis, abnormal luminal irregularity, aneurysm.  IMPRESSION: MRI HEAD: No acute intracranial process; normal noncontrast MRI of the brain, stable from September 27, 2014.  MRA HEAD: No acute vascular process, complete circle of Willis.   Electronically Signed   By: Elon Alas   On: 10/29/2014 01:43    Medications:  Scheduled: . amLODipine  10 mg Oral Daily  . aspirin  325 mg Oral Daily  . atorvastatin  10 mg Oral q1800  . cloNIDine  0.2 mg Oral BID  . famotidine  20 mg Oral Daily  . heparin  5,000 Units Subcutaneous 3 times per day  . hydrALAZINE  25 mg Oral 3 times per day  . metoprolol succinate  100 mg Oral BID  . nicotine  21 mg Transdermal Daily  . potassium chloride SA  20 mEq Oral Daily  . senna  1 tablet Oral  BID  . sodium chloride  3 mL Intravenous Q12H    Assessment/Plan:  44 y.o. male with a history of cardiac issues and polycythemia s/p recent phlebotomy who presents with left sided numbness. Neurological examination otherwise unremarkable. Head CT and MRI of the brain personally reviewed and shows no acute changes.Can not rule out a MR negative infarct. Further work up recommended. EEG finished but not read. A1c, Echo, carotids all pending.  LDL 129.    Recommend: 1) Continue stroke work up 2) Continue Statin 3) ASA daily 4) Smoking cessation  If pending labs and diagnostic studies negative, no further neurology work up warranted.    Etta Quill PA-C [facility] [phone]  10/30/2014, 10:42 AM

## 2014-10-30 NOTE — Progress Notes (Signed)
EEG Completed; Results Pending  

## 2014-10-31 ENCOUNTER — Other Ambulatory Visit (HOSPITAL_COMMUNITY): Payer: Medicare Other

## 2014-11-28 ENCOUNTER — Other Ambulatory Visit (HOSPITAL_COMMUNITY): Payer: Medicare Other

## 2014-11-28 ENCOUNTER — Ambulatory Visit (HOSPITAL_COMMUNITY): Payer: Medicare Other | Admitting: Oncology

## 2014-11-28 NOTE — Assessment & Plan Note (Signed)
AFP today

## 2014-11-28 NOTE — Assessment & Plan Note (Signed)
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  S/P three therapeutic phlebotomies on 1/8, 1/13, and 10/03/2014. Labs today: CBC diff, iron/TIBC, ferritin, AFP. Labs in 3 months: CBC diff.  Return in 3 months for follow-up.

## 2014-11-28 NOTE — Progress Notes (Signed)
-  No show-  Timothy [name]  

## 2014-11-29 DIAGNOSIS — G894 Chronic pain syndrome: Secondary | ICD-10-CM | POA: Diagnosis not present

## 2014-11-29 DIAGNOSIS — Z6829 Body mass index (BMI) 29.0-29.9, adult: Secondary | ICD-10-CM | POA: Diagnosis not present

## 2014-11-29 DIAGNOSIS — G459 Transient cerebral ischemic attack, unspecified: Secondary | ICD-10-CM | POA: Diagnosis not present

## 2014-11-29 DIAGNOSIS — E663 Overweight: Secondary | ICD-10-CM | POA: Diagnosis not present

## 2014-12-07 ENCOUNTER — Encounter (HOSPITAL_COMMUNITY): Payer: Self-pay

## 2015-01-31 DIAGNOSIS — E663 Overweight: Secondary | ICD-10-CM | POA: Diagnosis not present

## 2015-01-31 DIAGNOSIS — Z6827 Body mass index (BMI) 27.0-27.9, adult: Secondary | ICD-10-CM | POA: Diagnosis not present

## 2015-01-31 DIAGNOSIS — G894 Chronic pain syndrome: Secondary | ICD-10-CM | POA: Diagnosis not present

## 2015-02-21 DIAGNOSIS — E663 Overweight: Secondary | ICD-10-CM | POA: Diagnosis not present

## 2015-02-21 DIAGNOSIS — Z6828 Body mass index (BMI) 28.0-28.9, adult: Secondary | ICD-10-CM | POA: Diagnosis not present

## 2015-02-21 DIAGNOSIS — N200 Calculus of kidney: Secondary | ICD-10-CM | POA: Diagnosis not present

## 2015-02-21 DIAGNOSIS — G894 Chronic pain syndrome: Secondary | ICD-10-CM | POA: Diagnosis not present

## 2015-03-22 DIAGNOSIS — G894 Chronic pain syndrome: Secondary | ICD-10-CM | POA: Diagnosis not present

## 2015-03-22 DIAGNOSIS — Z1389 Encounter for screening for other disorder: Secondary | ICD-10-CM | POA: Diagnosis not present

## 2015-03-22 DIAGNOSIS — Z6829 Body mass index (BMI) 29.0-29.9, adult: Secondary | ICD-10-CM | POA: Diagnosis not present

## 2015-03-22 DIAGNOSIS — E663 Overweight: Secondary | ICD-10-CM | POA: Diagnosis not present

## 2015-06-20 DIAGNOSIS — G894 Chronic pain syndrome: Secondary | ICD-10-CM | POA: Diagnosis not present

## 2015-06-20 DIAGNOSIS — Z1389 Encounter for screening for other disorder: Secondary | ICD-10-CM | POA: Diagnosis not present

## 2015-06-20 DIAGNOSIS — E663 Overweight: Secondary | ICD-10-CM | POA: Diagnosis not present

## 2015-06-20 DIAGNOSIS — Z6829 Body mass index (BMI) 29.0-29.9, adult: Secondary | ICD-10-CM | POA: Diagnosis not present

## 2015-06-22 DIAGNOSIS — G894 Chronic pain syndrome: Secondary | ICD-10-CM | POA: Diagnosis not present

## 2015-06-22 DIAGNOSIS — Z6829 Body mass index (BMI) 29.0-29.9, adult: Secondary | ICD-10-CM | POA: Diagnosis not present

## 2015-06-22 DIAGNOSIS — Z1389 Encounter for screening for other disorder: Secondary | ICD-10-CM | POA: Diagnosis not present

## 2015-06-22 DIAGNOSIS — E663 Overweight: Secondary | ICD-10-CM | POA: Diagnosis not present

## 2015-09-21 DIAGNOSIS — E663 Overweight: Secondary | ICD-10-CM | POA: Diagnosis not present

## 2015-09-21 DIAGNOSIS — Z6829 Body mass index (BMI) 29.0-29.9, adult: Secondary | ICD-10-CM | POA: Diagnosis not present

## 2015-09-21 DIAGNOSIS — Z1389 Encounter for screening for other disorder: Secondary | ICD-10-CM | POA: Diagnosis not present

## 2015-09-21 DIAGNOSIS — G894 Chronic pain syndrome: Secondary | ICD-10-CM | POA: Diagnosis not present

## 2015-12-17 DIAGNOSIS — G894 Chronic pain syndrome: Secondary | ICD-10-CM | POA: Diagnosis not present

## 2015-12-17 DIAGNOSIS — Z6829 Body mass index (BMI) 29.0-29.9, adult: Secondary | ICD-10-CM | POA: Diagnosis not present

## 2015-12-17 DIAGNOSIS — F5101 Primary insomnia: Secondary | ICD-10-CM | POA: Diagnosis not present

## 2015-12-17 DIAGNOSIS — I10 Essential (primary) hypertension: Secondary | ICD-10-CM | POA: Diagnosis not present

## 2016-01-10 ENCOUNTER — Other Ambulatory Visit (HOSPITAL_COMMUNITY): Payer: Self-pay | Admitting: Preventative Medicine

## 2016-01-10 ENCOUNTER — Ambulatory Visit (HOSPITAL_COMMUNITY)
Admission: RE | Admit: 2016-01-10 | Discharge: 2016-01-10 | Disposition: A | Discharge status: Home / Self Care | Payer: Medicare Other | Source: Ambulatory Visit | Attending: Preventative Medicine | Admitting: Preventative Medicine

## 2016-01-10 DIAGNOSIS — S72001A Fracture of unspecified part of neck of right femur, initial encounter for closed fracture: Secondary | ICD-10-CM | POA: Diagnosis present

## 2016-01-10 DIAGNOSIS — M25551 Pain in right hip: Secondary | ICD-10-CM

## 2016-01-10 DIAGNOSIS — S79911A Unspecified injury of right hip, initial encounter: Secondary | ICD-10-CM | POA: Diagnosis not present

## 2016-02-28 DIAGNOSIS — Z6830 Body mass index (BMI) 30.0-30.9, adult: Secondary | ICD-10-CM | POA: Diagnosis not present

## 2016-02-28 DIAGNOSIS — E6609 Other obesity due to excess calories: Secondary | ICD-10-CM | POA: Diagnosis not present

## 2016-02-28 DIAGNOSIS — L049 Acute lymphadenitis, unspecified: Secondary | ICD-10-CM | POA: Diagnosis not present

## 2016-02-28 DIAGNOSIS — G894 Chronic pain syndrome: Secondary | ICD-10-CM | POA: Diagnosis not present

## 2016-03-17 IMAGING — MR MR HEAD W/O CM
9 of 12 series · 30 of 48 positions shown · non-contrast
Comparison: Head CT 08/24/2004

CLINICAL DATA: Headaches with occasional vomiting. More severe on
the right.

EXAM:
MRI HEAD WITHOUT CONTRAST
TECHNIQUE: Multiplanar, multiecho pulse sequences of the brain and surrounding
structures were obtained without intravenous contrast.

[Series 3: T1 · sagittal · 5.0mm · 0.43mm/px · 2 of 21 slices shown (1 of 2)]
[im 1/21]
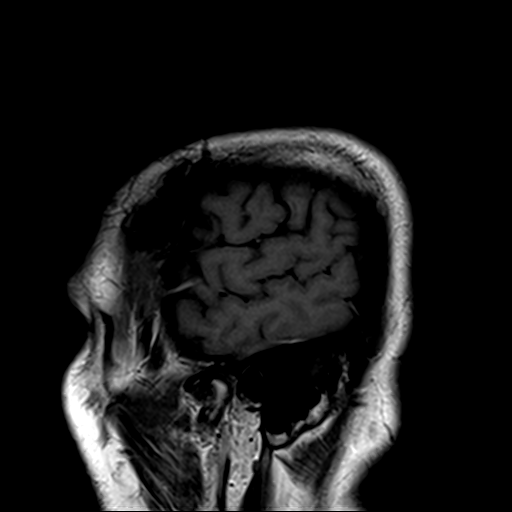
[im 21/21]
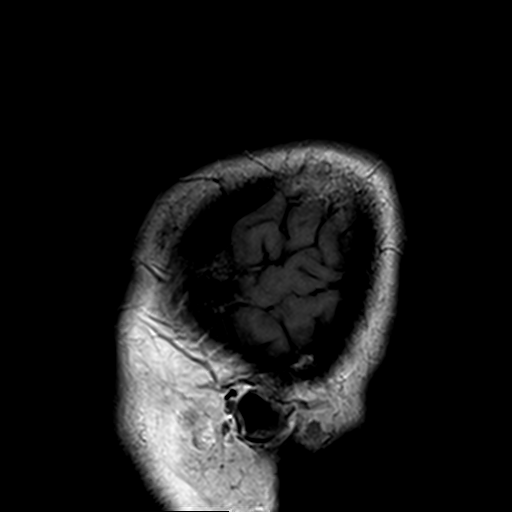

[Series 6: T2 · axial · 5.0mm · 0.51mm/px · z∈[-123,+33]mm · 2 of 25 slices shown (1 of 2)]
[im 1/25]
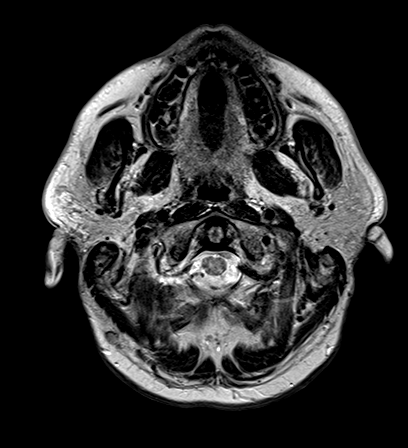
[im 25/25]
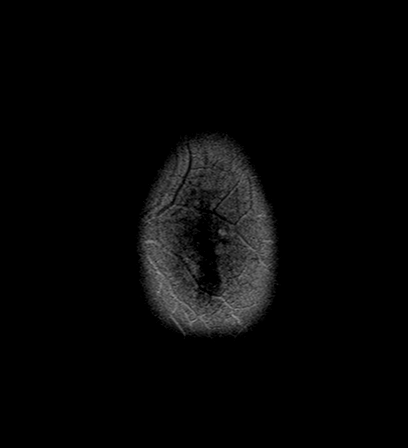

[Series 7: FLAIR · axial · 5.0mm · 0.38mm/px · z∈[-123,+33]mm · 2 of 25 slices shown]
[im 1/25]
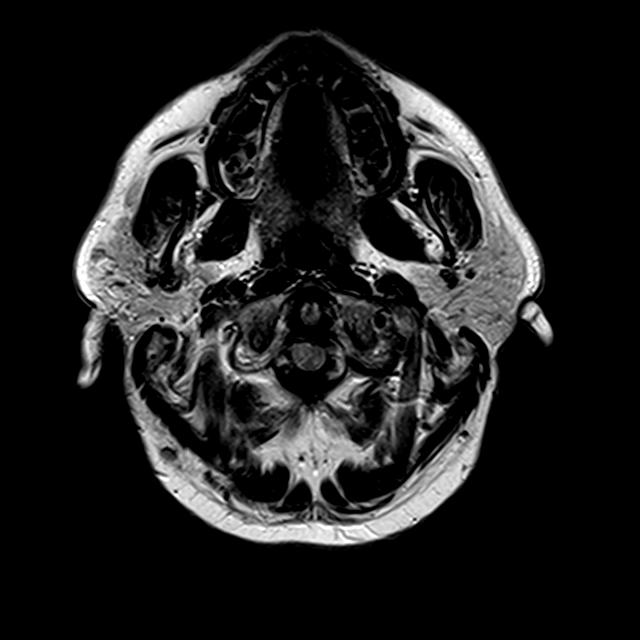
[im 25/25]
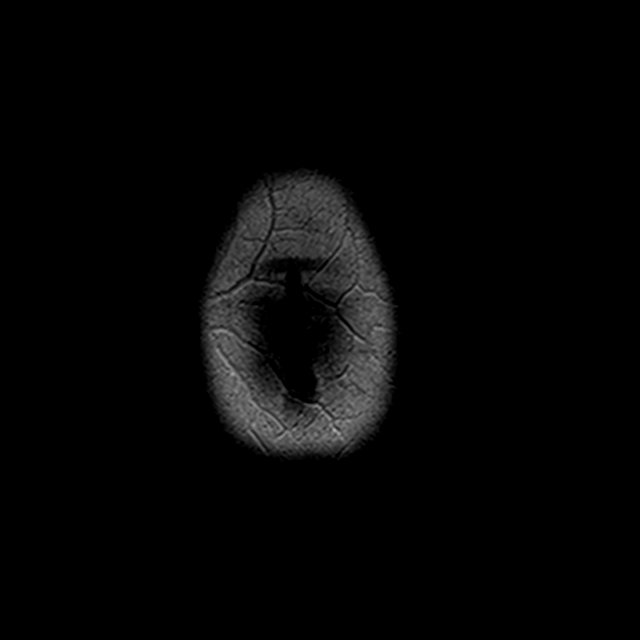

[Series 8: T1 · axial · 2.0mm · 0.45mm/px · z∈[-138,+36]mm · 7 of 88 slices shown (2 of 2)]
[im 1/88]
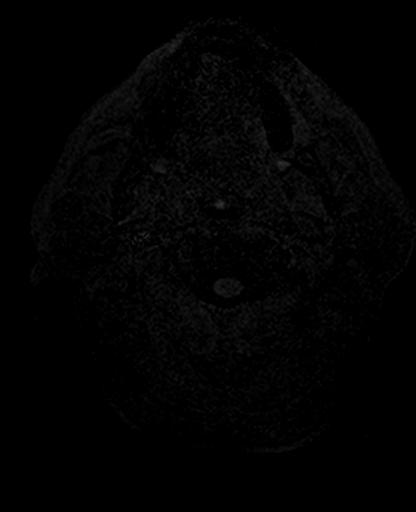
[im 15/88]
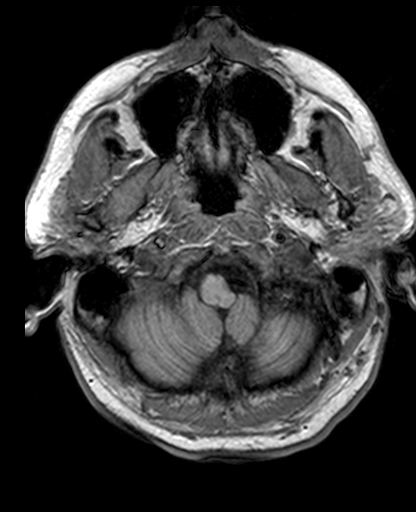
[im 30/88]
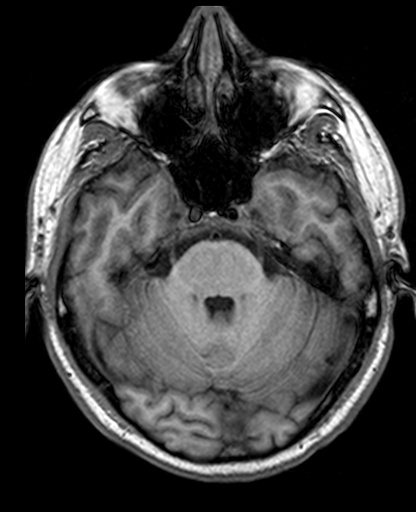
[im 44/88]
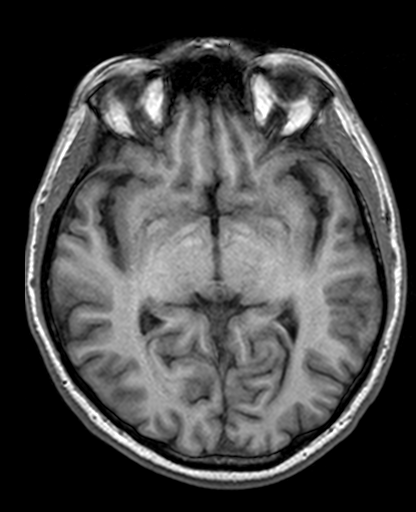
[im 59/88]
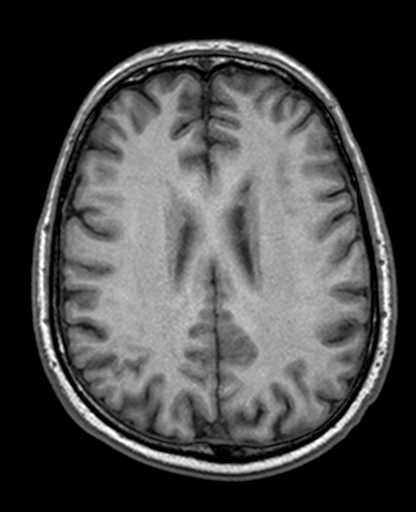
[im 73/88]
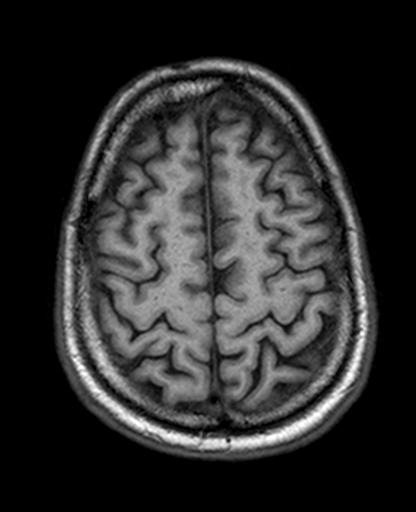
[im 88/88]
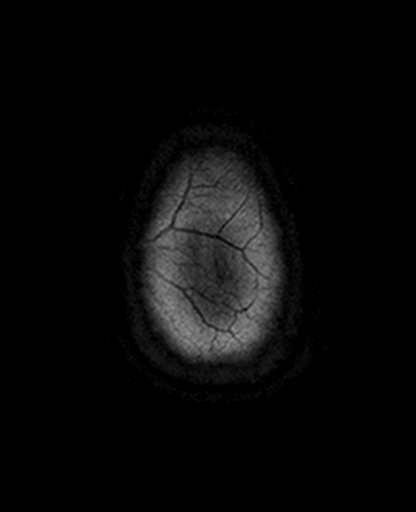

[Series 9: trauma axial · axial · 5.0mm · 0.45mm/px · z∈[-123,+33]mm · 2 of 25 slices shown]
[im 1/25]
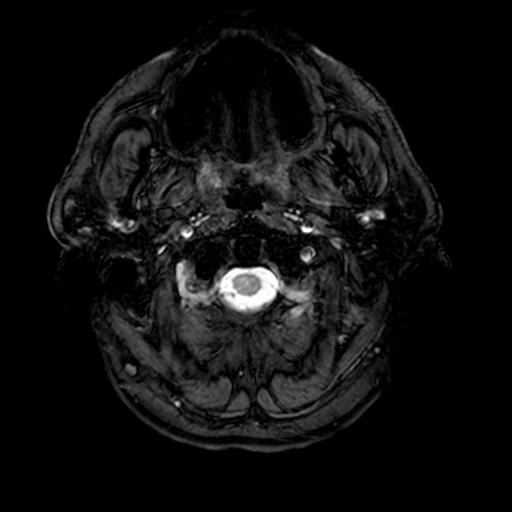
[im 25/25]
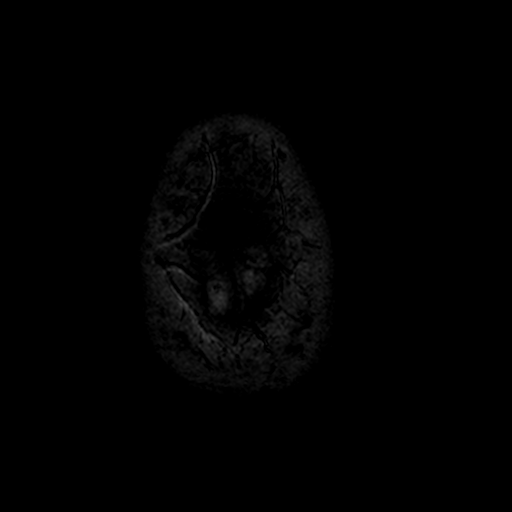

[Series 10: T2 · coronal · 5.0mm · 0.52mm/px · 2 of 25 slices shown (2 of 2)]
[im 1/25]
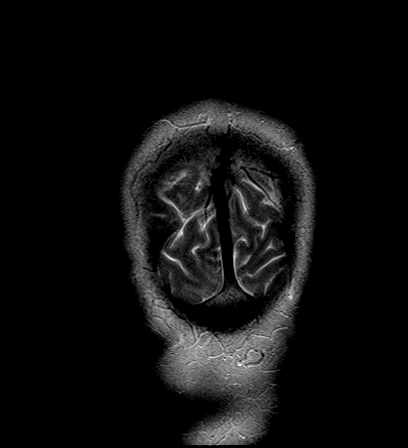
[im 25/25]
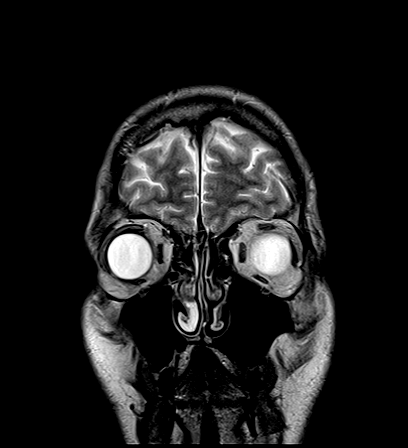

[Series 100: <mpr thick range> · axial · 3.0mm · 0.82mm/px · z∈[-124,+29]mm · 4 of 52 slices shown]
[im 1/52]
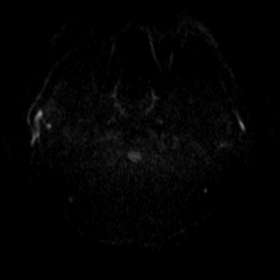
[im 18/52]
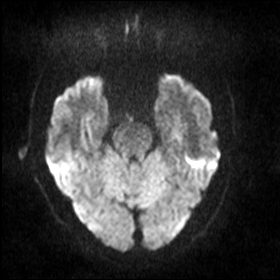
[im 35/52]
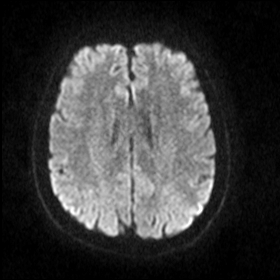
[im 52/52]
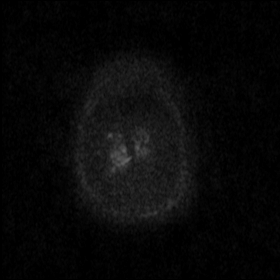

[Series 101: <mpr thick range(1)> · coronal · 3.0mm · 0.68mm/px · 5 of 62 slices shown]
[im 1/62]
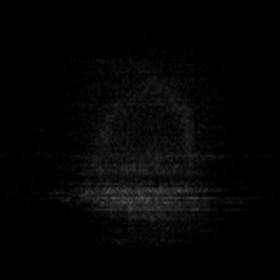
[im 16/62]
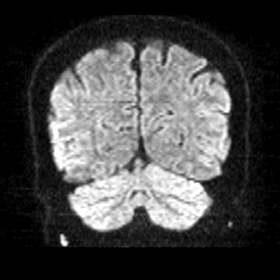
[im 31/62]
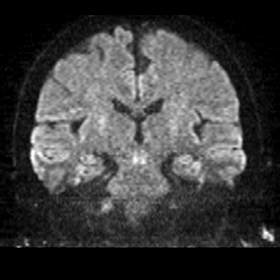
[im 46/62]
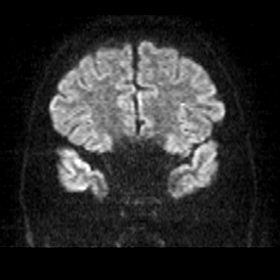
[im 62/62]
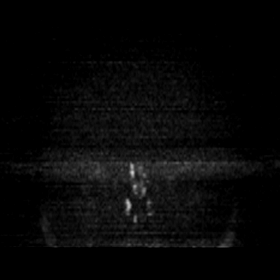

[Series 102: <mpr thick range(2)> · axial · 3.0mm · 0.82mm/px · z∈[-124,+29]mm · 4 of 51 slices shown]
[im 1/51]
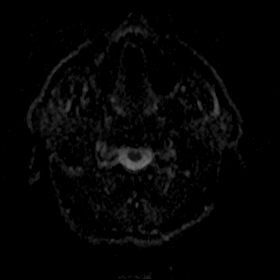
[im 17/51]
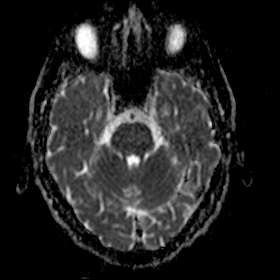
[im 34/51]
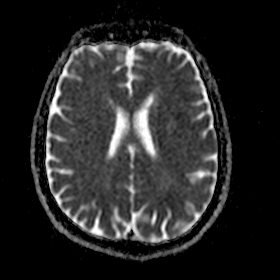
[im 51/51]
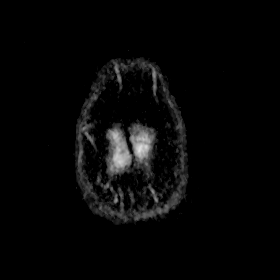

[30 of 48 positions shown; findings below may reference images not displayed]

FINDINGS: Diffusion imaging does not show any acute or subacute infarction.
The brainstem and cerebellum are normal, except that there is
vascular impression upon the left side of the brainstem because of
the vertebral artery. This is a common finding and is usually
asymptomatic. There are reports of this being associated with
cranial nerve palsies and pyramidal tract symptoms, but not
headache. The cerebral hemispheres show a few tiny foci of T2 and
FLAIR signal in the deep white matter. The finding is commonly seen
in nonspecific. One could not exclude an early manifestation of
demyelinating disease, but that is not strongly suggested. No
cortical or large vessel territory abnormality. No mass lesion,
hemorrhage, hydrocephalus or extra-axial collection. No pituitary
mass. No significant sinus disease. No skull or skullbase lesion.
IMPRESSION: No cause of headache is identified. Study does not show any likely
significant finding. There are a few punctate white matter foci that
are probably not of clinical relevance.

Impression upon the left side of the brainstem by a tortuous basilar
artery. Usually, this is asymptomatic. It can be associated with
cranial nerve palsy or pyramidal tract signs. I am not aware of any
association with headache.

## 2016-04-11 ENCOUNTER — Encounter (HOSPITAL_COMMUNITY): Payer: Self-pay | Admitting: Emergency Medicine

## 2016-04-11 ENCOUNTER — Observation Stay (HOSPITAL_COMMUNITY)
Admission: EM | Admit: 2016-04-11 | Discharge: 2016-04-12 | Disposition: A | Discharge status: Home / Self Care | Payer: Medicare Other | Attending: Internal Medicine | Admitting: Internal Medicine

## 2016-04-11 ENCOUNTER — Emergency Department (HOSPITAL_COMMUNITY): Payer: Medicare Other

## 2016-04-11 DIAGNOSIS — D751 Secondary polycythemia: Secondary | ICD-10-CM | POA: Diagnosis not present

## 2016-04-11 DIAGNOSIS — D7589 Other specified diseases of blood and blood-forming organs: Secondary | ICD-10-CM | POA: Insufficient documentation

## 2016-04-11 DIAGNOSIS — I2510 Atherosclerotic heart disease of native coronary artery without angina pectoris: Secondary | ICD-10-CM | POA: Diagnosis not present

## 2016-04-11 DIAGNOSIS — G4733 Obstructive sleep apnea (adult) (pediatric): Secondary | ICD-10-CM | POA: Diagnosis not present

## 2016-04-11 DIAGNOSIS — E876 Hypokalemia: Secondary | ICD-10-CM | POA: Insufficient documentation

## 2016-04-11 DIAGNOSIS — R042 Hemoptysis: Secondary | ICD-10-CM

## 2016-04-11 DIAGNOSIS — I10 Essential (primary) hypertension: Secondary | ICD-10-CM | POA: Diagnosis not present

## 2016-04-11 DIAGNOSIS — F17200 Nicotine dependence, unspecified, uncomplicated: Secondary | ICD-10-CM | POA: Diagnosis present

## 2016-04-11 DIAGNOSIS — I5033 Acute on chronic diastolic (congestive) heart failure: Secondary | ICD-10-CM

## 2016-04-11 DIAGNOSIS — G4731 Primary central sleep apnea: Secondary | ICD-10-CM | POA: Insufficient documentation

## 2016-04-11 DIAGNOSIS — R0600 Dyspnea, unspecified: Secondary | ICD-10-CM

## 2016-04-11 DIAGNOSIS — R748 Abnormal levels of other serum enzymes: Secondary | ICD-10-CM | POA: Insufficient documentation

## 2016-04-11 DIAGNOSIS — Z79899 Other long term (current) drug therapy: Secondary | ICD-10-CM | POA: Insufficient documentation

## 2016-04-11 DIAGNOSIS — Z7982 Long term (current) use of aspirin: Secondary | ICD-10-CM | POA: Insufficient documentation

## 2016-04-11 DIAGNOSIS — Z720 Tobacco use: Secondary | ICD-10-CM

## 2016-04-11 DIAGNOSIS — R778 Other specified abnormalities of plasma proteins: Secondary | ICD-10-CM

## 2016-04-11 DIAGNOSIS — Z8673 Personal history of transient ischemic attack (TIA), and cerebral infarction without residual deficits: Secondary | ICD-10-CM | POA: Diagnosis not present

## 2016-04-11 DIAGNOSIS — F17210 Nicotine dependence, cigarettes, uncomplicated: Secondary | ICD-10-CM | POA: Diagnosis not present

## 2016-04-11 DIAGNOSIS — R079 Chest pain, unspecified: Secondary | ICD-10-CM | POA: Insufficient documentation

## 2016-04-11 DIAGNOSIS — K219 Gastro-esophageal reflux disease without esophagitis: Secondary | ICD-10-CM | POA: Diagnosis not present

## 2016-04-11 DIAGNOSIS — R609 Edema, unspecified: Secondary | ICD-10-CM

## 2016-04-11 DIAGNOSIS — D75839 Thrombocytosis, unspecified: Secondary | ICD-10-CM

## 2016-04-11 DIAGNOSIS — I110 Hypertensive heart disease with heart failure: Secondary | ICD-10-CM | POA: Diagnosis not present

## 2016-04-11 DIAGNOSIS — R0602 Shortness of breath: Secondary | ICD-10-CM | POA: Diagnosis not present

## 2016-04-11 DIAGNOSIS — Z8679 Personal history of other diseases of the circulatory system: Secondary | ICD-10-CM

## 2016-04-11 DIAGNOSIS — E785 Hyperlipidemia, unspecified: Secondary | ICD-10-CM | POA: Insufficient documentation

## 2016-04-11 DIAGNOSIS — IMO0001 Reserved for inherently not codable concepts without codable children: Secondary | ICD-10-CM | POA: Diagnosis present

## 2016-04-11 DIAGNOSIS — D72829 Elevated white blood cell count, unspecified: Secondary | ICD-10-CM

## 2016-04-11 DIAGNOSIS — D473 Essential (hemorrhagic) thrombocythemia: Secondary | ICD-10-CM | POA: Diagnosis not present

## 2016-04-11 DIAGNOSIS — R7989 Other specified abnormal findings of blood chemistry: Secondary | ICD-10-CM

## 2016-04-11 DIAGNOSIS — R0601 Orthopnea: Secondary | ICD-10-CM

## 2016-04-11 LAB — TROPONIN I
TROPONIN I: 0.08 ng/mL — AB (ref <0.03)
Troponin I: 0.12 ng/mL (ref <0.03)
Troponin I: 0.06 ng/mL (ref <0.03)
Troponin I: 0.05 ng/mL (ref <0.03)

## 2016-04-11 LAB — URINALYSIS, ROUTINE W REFLEX MICROSCOPIC
BILIRUBIN URINE: NEGATIVE
Glucose, UA: NEGATIVE mg/dL
Hgb urine dipstick: NEGATIVE
Ketones, ur: NEGATIVE mg/dL
LEUKOCYTES UA: NEGATIVE
NITRITE: NEGATIVE
Protein, ur: NEGATIVE mg/dL
SPECIFIC GRAVITY, URINE: 1.011 (ref 1.005–1.030)
pH: 7.5 (ref 5.0–8.0)

## 2016-04-11 LAB — CBC WITH DIFFERENTIAL/PLATELET
BASOS ABS: 0.1 10*3/uL (ref 0.0–0.1)
Basophils Relative: 0 %
Eosinophils Absolute: 0.2 10*3/uL (ref 0.0–0.7)
Eosinophils Relative: 2 %
HEMATOCRIT: 43.9 % (ref 39.0–52.0)
HEMOGLOBIN: 14.9 g/dL (ref 13.0–17.0)
LYMPHS PCT: 13 %
Lymphs Abs: 2.1 10*3/uL (ref 0.7–4.0)
MCH: 26.1 pg (ref 26.0–34.0)
MCHC: 33.9 g/dL (ref 30.0–36.0)
MCV: 77 fL — AB (ref 78.0–100.0)
Monocytes Absolute: 1.1 10*3/uL — ABNORMAL HIGH (ref 0.1–1.0)
Monocytes Relative: 7 %
NEUTROS ABS: 12.8 10*3/uL — AB (ref 1.7–7.7)
NEUTROS PCT: 78 %
Platelets: 417 10*3/uL — ABNORMAL HIGH (ref 150–400)
RBC: 5.7 MIL/uL (ref 4.22–5.81)
RDW: 18.5 % — ABNORMAL HIGH (ref 11.5–15.5)
WBC: 16.3 10*3/uL — AB (ref 4.0–10.5)

## 2016-04-11 LAB — RAPID URINE DRUG SCREEN, HOSP PERFORMED
Amphetamines: NOT DETECTED
Barbiturates: NOT DETECTED
Benzodiazepines: NOT DETECTED
COCAINE: NOT DETECTED
OPIATES: NOT DETECTED
TETRAHYDROCANNABINOL: POSITIVE — AB

## 2016-04-11 LAB — BASIC METABOLIC PANEL
ANION GAP: 12 (ref 5–15)
BUN: 9 mg/dL (ref 6–20)
CHLORIDE: 99 mmol/L — AB (ref 101–111)
CO2: 27 mmol/L (ref 22–32)
Calcium: 9.2 mg/dL (ref 8.9–10.3)
Creatinine, Ser: 1.24 mg/dL (ref 0.61–1.24)
GFR calc non Af Amer: >60 mL/min (ref >60)
Glucose, Bld: 97 mg/dL (ref 65–99)
POTASSIUM: 3.3 mmol/L — AB (ref 3.5–5.1)
SODIUM: 138 mmol/L (ref 135–145)

## 2016-04-11 LAB — BRAIN NATRIURETIC PEPTIDE: B Natriuretic Peptide: 292.3 pg/mL — ABNORMAL HIGH (ref 0.0–100.0)

## 2016-04-11 LAB — D-DIMER, QUANTITATIVE (NOT AT ARMC)

## 2016-04-11 MED ORDER — OXYCODONE HCL 5 MG PO TABS
20.0000 mg | ORAL_TABLET | Freq: Four times a day (QID) | ORAL | Status: DC | PRN
  Administered 2016-04-11 – 2016-04-12 (×4): 20 mg via ORAL
  Filled 2016-04-11 (×4): qty 4

## 2016-04-11 MED ORDER — LORAZEPAM 2 MG/ML IJ SOLN: 0.2500 mg | Freq: Four times a day (QID) | INTRAMUSCULAR | Status: DC | PRN

## 2016-04-11 MED ORDER — LORAZEPAM 0.5 MG PO TABS
0.2500 mg | ORAL_TABLET | Freq: Four times a day (QID) | ORAL | Status: DC | PRN
  Administered 2016-04-11: 0.25 mg via ORAL
  Filled 2016-04-11: qty 1

## 2016-04-11 MED ORDER — ASPIRIN 325 MG PO TABS
325.0000 mg | ORAL_TABLET | Freq: Every day | ORAL | Status: DC
  Administered 2016-04-12: 325 mg via ORAL
  Filled 2016-04-11: qty 1

## 2016-04-11 MED ORDER — METOPROLOL SUCCINATE ER 100 MG PO TB24
100.0000 mg | ORAL_TABLET | Freq: Two times a day (BID) | ORAL | Status: DC
  Administered 2016-04-11 – 2016-04-12 (×3): 100 mg via ORAL
  Filled 2016-04-11 (×3): qty 1

## 2016-04-11 MED ORDER — ACETAMINOPHEN 325 MG PO TABS: 650.0000 mg | ORAL_TABLET | ORAL | Status: DC | PRN

## 2016-04-11 MED ORDER — GUAIFENESIN ER 600 MG PO TB12
600.0000 mg | ORAL_TABLET | Freq: Two times a day (BID) | ORAL | Status: DC
  Administered 2016-04-11 – 2016-04-12 (×3): 600 mg via ORAL
  Filled 2016-04-11 (×3): qty 1

## 2016-04-11 MED ORDER — ASPIRIN 81 MG PO CHEW
324.0000 mg | CHEWABLE_TABLET | Freq: Once | ORAL | Status: AC
  Administered 2016-04-11: 324 mg via ORAL
  Filled 2016-04-11: qty 4

## 2016-04-11 MED ORDER — POTASSIUM CHLORIDE CRYS ER 20 MEQ PO TBCR
40.0000 meq | EXTENDED_RELEASE_TABLET | Freq: Once | ORAL | Status: AC
  Administered 2016-04-11: 40 meq via ORAL
  Filled 2016-04-11: qty 2

## 2016-04-11 MED ORDER — SODIUM CHLORIDE 0.9% FLUSH: 3.0000 mL | INTRAVENOUS | Status: DC | PRN

## 2016-04-11 MED ORDER — CLONIDINE HCL 0.2 MG PO TABS
0.2000 mg | ORAL_TABLET | Freq: Two times a day (BID) | ORAL | Status: DC
  Administered 2016-04-11 – 2016-04-12 (×3): 0.2 mg via ORAL
  Filled 2016-04-11 (×3): qty 1

## 2016-04-11 MED ORDER — NICOTINE 21 MG/24HR TD PT24
21.0000 mg | MEDICATED_PATCH | Freq: Every day | TRANSDERMAL | Status: DC
  Administered 2016-04-11 – 2016-04-12 (×2): 21 mg via TRANSDERMAL
  Filled 2016-04-11 (×2): qty 1

## 2016-04-11 MED ORDER — ONDANSETRON HCL 4 MG/2ML IJ SOLN: 4.0000 mg | Freq: Four times a day (QID) | INTRAMUSCULAR | Status: DC | PRN

## 2016-04-11 MED ORDER — POTASSIUM CHLORIDE CRYS ER 20 MEQ PO TBCR
20.0000 meq | EXTENDED_RELEASE_TABLET | Freq: Every day | ORAL | Status: DC
  Administered 2016-04-12: 20 meq via ORAL
  Filled 2016-04-11: qty 1

## 2016-04-11 MED ORDER — SODIUM CHLORIDE 0.9% FLUSH
3.0000 mL | Freq: Two times a day (BID) | INTRAVENOUS | Status: DC
  Administered 2016-04-11: 3 mL via INTRAVENOUS

## 2016-04-11 MED ORDER — BISACODYL 5 MG PO TBEC: 5.0000 mg | DELAYED_RELEASE_TABLET | Freq: Every day | ORAL | Status: DC | PRN

## 2016-04-11 MED ORDER — FAMOTIDINE 20 MG PO TABS
20.0000 mg | ORAL_TABLET | Freq: Every day | ORAL | Status: DC
  Administered 2016-04-11 – 2016-04-12 (×2): 20 mg via ORAL
  Filled 2016-04-11 (×2): qty 1

## 2016-04-11 MED ORDER — POLYETHYLENE GLYCOL 3350 17 G PO PACK: 17.0000 g | PACK | Freq: Every day | ORAL | Status: DC | PRN

## 2016-04-11 MED ORDER — SODIUM CHLORIDE 0.9 % IV SOLN: 250.0000 mL | INTRAVENOUS | Status: DC | PRN

## 2016-04-11 MED ORDER — ENOXAPARIN SODIUM 40 MG/0.4ML ~~LOC~~ SOLN
40.0000 mg | SUBCUTANEOUS | Status: DC
  Administered 2016-04-11 – 2016-04-12 (×2): 40 mg via SUBCUTANEOUS
  Filled 2016-04-11 (×2): qty 0.4

## 2016-04-11 MED ORDER — FUROSEMIDE 20 MG PO TABS
20.0000 mg | ORAL_TABLET | Freq: Every day | ORAL | Status: DC
Start: 2016-04-12 — End: 2016-04-12
  Administered 2016-04-12: 20 mg via ORAL
  Filled 2016-04-11: qty 1

## 2016-04-11 NOTE — ED Triage Notes (Signed)
Per pt, he experienced sudden onset shortness of breath and chest pressure, with diaphoresis. Pt states that he took a lasix PTA.

## 2016-04-11 NOTE — Progress Notes (Signed)
Pt c/o having difficulty breathing and awaking from nap in a cold sweat.  Vital signs assessed and WNL.  Pt states that he feels anxious and these symptoms seem related to anxiety.  He said he would like something for his nerves, preferably not xanax.  Text page sent to NP.

## 2016-04-11 NOTE — Progress Notes (Signed)
Janne Napoleon, NP called.  Pt concerned about pain management.  States he has chronic back pain and takes oxycodone 10mg  four times daily.  She is going to update orders with PRN meds for pain.

## 2016-04-11 NOTE — ED Notes (Signed)
Report to [facility] W

## 2016-04-11 NOTE — Care Management Obs Status (Signed)
[facility] [facility] NOTIFICATION   Patient Details  Name: Timothy [name]. MRN: [id number] Date of Birth: [birth year]   Medicare Observation Status Notification Given:  Yes    Dawayne Patricia, [place] 04/11/2016, 11:40 AM

## 2016-04-11 NOTE — Progress Notes (Signed)
Patient has OSA but has never been prescribed CPAP.

## 2016-04-11 NOTE — Consult Note (Addendum)
Cardiology Consult    Patient ID: Timothy [name]. MRN: [id number], DOB/AGE: [birth year]   Admit date: 04/11/2016 Date of Consult: 04/11/2016  Primary Physician: Glo Herring., MD Reason for Consult: CHF, chest pain Primary Cardiologist: Dr. Harl Bowie, last seen 2015 Requesting Provider: Dr.Elgergawy  Patient Profile  Timothy [name] is a 45 year old male with a past medical history of HTN, CHF, Wolff-Parkinson-White syndrome (s/p ablation x 3), sleep apnea with noncompliance to CPAP, and non obstructive CAD. He presented to the ED on 04/11/16 with orthopnea and pink sputum.   History of Present Illness  Timothy [name] woke up early this morning with dyspnea and productive cough with pink sputum. He had some slight chest pain as well, that he describes as tightness. He took 120 mg of by mouth Lasix and had some relief of his symptoms.  He tells me about 3 weeks ago he started having episodes of chest tightness with profuse diaphoresis that would last about 20 minutes. He did feel short of breath with these episodes. He describes one episode about one week ago where he had acute onset chest pain located substernally with radiation to the left side of his chest, this lasted about one hour and was associated with some shortness of breath. He reports feeling more fatigued especially in the past month. He attributed this initially to working about 60 hours a week.  He has had a heart catheterization in the past. The last one was in 2007. At that time he had 50% stenosis in his LAD. His last echocardiogram was in February 2016, his EF was 50-55% and he had some mild mitral valve regurgitation.  He also has had a history of heart palpitations, and has had previous ablations for WPW syndrome. He is on metoprolol at home and takes this with high compliance. He tells me that he experiences palpitations at least 2 times a week, but sometimes as frequently as 10 times a day depending on his activity. His  palpitations are him times associated with diaphoresis.  His troponin is mildly elevated at 0.12. BNP is elevated at 292.3.   He is a current every day smoker, he has smoked for 30 years. He tells me he drinks 2 beers a day. Denies drug use however his UDS showed marijuana.  Also of note he tells me that he has had increased daytime somnolence and experiences extreme fatigue at times.  He has had a sleep study in the past and has been told that he needs a CPAP but this has been many years ago. He tells me that his wife complains of him not breathing at night while he sleeping.   He has a family history of coronary artery disease. His father is status post CABG in 2007.   Past Medical History   Past Medical History:  Diagnosis Date  . Anxiety   . Arthritis   . CAD (coronary artery disease) 2007   50% stenosis small diagonal  . Central sleep apnea   . Chronic pain    since 1998 has been on chronic pain medication  . GERD (gastroesophageal reflux disease)   . Hemorrhoids   . Hepatomegaly 10/03/2014  . HTN (hypertension)   . Palpitations   . Syncope   . Tachycardia   . Tobacco abuse   . Tubular adenoma   . Wolff-Parkinson-White (WPW) syndrome    says was cured with ablation    Past Surgical History:  Procedure Laterality Date  . ADENOIDECTOMY    .  APPENDECTOMY    . ATRIAL ABLATION SURGERY    . AV NODE ABLATION    . BIOPSY N/A 05/12/2013   Procedure: BIOPSY;  Surgeon: Daneil Dolin, MD;  Location: [facility];  Service: Endoscopy;  Laterality: N/A;  . CARDIAC ELECTROPHYSIOLOGY STUDY AND ABLATION     SA node ablation  . COLONOSCOPY     age 53  . COLONOSCOPY WITH PROPOFOL N/A 05/12/2013   Dr. Gala Romney- hemorrhoids, tubular adenoma  . ESOPHAGOGASTRODUODENOSCOPY (EGD) WITH PROPOFOL N/A 05/12/2013   Dr. Gala Romney- hiatal hernia, erosive reflux esophagitis  . HAND SURGERY    . HEMORRHOID BANDING  06/22/13  . POLYPECTOMY N/A 05/12/2013   Procedure: POLYPECTOMY;  Surgeon: Daneil Dolin, MD;   Location: [facility];  Service: Endoscopy;  Laterality: N/A;  . SHOULDER SURGERY       Allergies  Allergies  Allergen Reactions  . Morphine Other (See Comments)    Makes overly sleepy    Inpatient Medications    . [START ON 04/12/2016] aspirin  325 mg Oral Daily  . cloNIDine  0.2 mg Oral BID  . enoxaparin (LOVENOX) injection  40 mg Subcutaneous Q24H  . famotidine  20 mg Oral Daily  . [START ON 04/12/2016] furosemide  20 mg Oral Daily  . guaiFENesin  600 mg Oral BID  . metoprolol succinate  100 mg Oral BID  . nicotine  21 mg Transdermal Daily  . [START ON 04/12/2016] potassium chloride  20 mEq Oral Daily  . sodium chloride flush  3 mL Intravenous Q12H    Family History    Family History  Problem Relation Age of Onset  . Coronary artery disease Father     cabg  . Colon cancer Father     age 39, chemo/surgery  . Breast cancer Mother   . Hypertension Mother     Social History    Social History   Social History  . Marital status: Married    Spouse name: N/A  . Number of children: 2  . Years of education: N/A   Occupational History  . unemployed Unemployed   Social History Main Topics  . Smoking status: Current Every Day Smoker    Packs/day: 0.50    Years: 25.00    Types: Cigarettes  . Smokeless tobacco: Never Used  . Alcohol use 1.2 oz/week    2 Cans of beer per week     Comment: daily  . Drug use: No  . Sexual activity: Yes    Birth control/ protection: None   Other Topics Concern  . Not on file   Social History Narrative  . No narrative on file     Review of Systems    General:  No chills, fever, night sweats or weight changes.  Cardiovascular:  + chest pain, dyspnea on exertion, edema, + orthopnea, + palpitations, paroxysmal nocturnal dyspnea. Dermatological: No rash, lesions/masses Respiratory: No cough, + dyspnea Urologic: No hematuria, dysuria Abdominal:   No nausea, vomiting, diarrhea, bright red blood per rectum, melena, or  hematemesis Neurologic:  No visual changes, wkns, changes in mental status. All other systems reviewed and are otherwise negative except as noted above.  Physical Exam    Blood pressure 133/87, pulse 87, temperature 97.7 F (36.5 C), temperature source Oral, resp. rate 18, height 6' (1.829 m), weight 215 lb (97.5 kg), SpO2 100 %.  General: Pleasant, NAD Psych: Normal affect. Neuro: Alert and oriented X 3. Moves all extremities spontaneously. HEENT: Normal  Neck: Supple without bruits or  JVD. Lungs:  Resp regular and unlabored, CTA. Heart: RRR no s3, s4, or murmurs. Abdomen: Soft, non-tender, non-distended, BS + x 4.  Extremities: No clubbing, cyanosis or edema. DP/PT/Radials 2+ and equal bilaterally.  Labs    Tropon Recent Labs  04/11/16 0506 04/11/16 0931  TROPONINI 0.12* 0.08*   Lab Results  Component Value Date   WBC 16.3 (H) 04/11/2016   HGB 14.9 04/11/2016   HCT 43.9 04/11/2016   MCV 77.0 (L) 04/11/2016   PLT 417 (H) 04/11/2016    Recent Labs Lab 04/11/16 0506  NA 138  K 3.3*  CL 99*  CO2 27  BUN 9  CREATININE 1.24  CALCIUM 9.2  GLUCOSE 97   Lab Results  Component Value Date   CHOL 187 10/29/2014   HDL 38 (L) 10/29/2014   LDLCALC 129 (H) 10/29/2014   TRIG 102 10/29/2014   Lab Results  Component Value Date   DDIMER <0.00 (L) 04/11/2016     Radiology Studies    Dg Chest 2 View  Result Date: 04/11/2016 CLINICAL DATA:  45 year old male with hemoptysis. Shortness of breath. EXAM: CHEST  2 VIEW COMPARISON:  Chest radiograph dated 03/08/2013 FINDINGS: Two views of the chest demonstrate increased interstitial prominence with Kerley B-lines compatible with interstitial edema. Right lung base hazy density, likely atelectatic changes. Pneumonia is less likely. Clinical correlation is recommended. There is no focal consolidation, pleural effusion, or pneumothorax. A 1 cm nodular appearing density in the right lower lung field most likely represent the nipple  shadow. The cardiac silhouette is within normal limits. No acute osseous pathology. IMPRESSION: Mild interstitial edema.  No focal consolidation. Electronically Signed   By: Anner Crete M.D.   On: 04/11/2016 06:10   EKG & Cardiac Imaging    EKG:   Echocardiogram: pending   04/2006 Cath HEMODYNAMIC RESULTS: Aorta 102/77 mmHg. Left ventricle 102/3 mmHg.  ANGIOGRAPHIC FINDINGS:  1. Left main coronary artery is relatively long and gives rise to the left  anterior descending and circumflex coronary arteries. There is no  significant flow-limiting coronary atherosclerosis noted.  2. Left anterior descending is a medium caliber vessel extending to the  apex. Septal perforators are noted in the midvessel segment and prior  to this very proximally are two diagonal branches. No significant  flow-limiting disease is noted within the left anterior descending.  Within the first diagonal there is a bifurcation and in the inferior  subbranch is a 50% stenosis. This vessel size is less than 2 mm.  3. The circumflex coronary artery is a medium to large caliber vessel with  three obtuse marginal branches. There is no significant flow-limiting  coronary atherosclerosis noted.  4. The right coronary artery is a dominant vessel that is medium to large  in caliber. There are minor luminal irregularities including  approximately 20% stenosis in the posterolateral system.  Left ventriculography was performed in the RAO projection and reveals an  ejection fraction approximately 50-55% without focal wall motion abnormality  and with no significant mitral regurgitation.  Diagnoses  1. Mild branch vessel coronary atherosclerosis with no major flow-limiting  disease in the larger epicardial vessels.  2. Left ventricular ejection fraction of approximately 50-55% with no  significant mitral regurgitation and a left ventricular end-diastolic  pressure of 3 mmHg.      Assessment & Plan  1. Chest pain:  Patient has some classic anginal symptoms with chest pain at rest associated with SOB and profuse diaphoresis. He presents with SOB, elevated BNP  and chest x ray consistent with mild interstitial edema. However, I think he would benefit from ischemic evaluation this admission given his risk factors of HTN, family history,HLD,  and tobacco abuse. EKG shows NSR with RBBB.   2. Dyspnea: Does not appear massively volume overloaded on exam, however still has some orthopnea. Would give one dose IV lasix today. Will await Echo results.   3. HTN: Normotensive on beta blocker and clonidine. Looks like he is on amlodipine and hydralazine as well at home. Would restart this if hypertensive.   4. HLD: LDL is 129. Would restart home Lipitor.   Signed, Arbutus Leas, NP 04/11/2016, 12:37 PM Pager: [phone] The patient has been seen in conjunction with Jettie Booze, NP.Marland Kitchen All aspects of care have been considered and discussed. The patient has been personally interviewed, examined, and all clinical data has been reviewed.   45 year old gentleman with a confusing history and presentation of awakening with smothering productive of frothy blood-tinged sputum that he feels gradually improved after taking 120 mg of Lasix orally. He also states that upon awakening his heart rate was 220 bpm and remained in this range for greater than 2 hours. Upon arrival in the ED, heart rates were normal. He has a history of WPW, ablation 3 by Norville Haggard at [facility], nonobstructive coronary artery disease, hypertension, sleep apnea, tobacco abuse, and chronic pain syndrome.  We will consult a because of elevated troponin levels and the above history.  Current exam is normal. Monitor has not demonstrated any abnormality.  Overall, the etiology for the patient's presentation is not clear. Potentially he developed flash pulmonary edema due to arrhythmia related to WPW. Cannot exclude underlying CAD with  ACS.  Plan is to cycle cardiac markers, place on telemetry, perform echocardiogram, and developed a diagnostic and treatment plan based upon accumulating database.  Diagnostic considerations will include an ischemic evaluation with either nuclear scintigraphy or coronary angiography. He will need prolonged outpatient monitoring to discover if reentrant arrhythmias related to WPW could be the root of this presentation.

## 2016-04-11 NOTE — ED Provider Notes (Signed)
[facility] DEPT Provider Note   CSN: [id number] Arrival date & time: 04/11/16  0430  First Provider Contact:  First MD Initiated Contact with Patient 04/11/16 0444        History   Chief Complaint Chief Complaint  Patient presents with  . Shortness of Breath  . Chest Pain    HPI Timothy [name]. is a 45 y.o. male.  The history is provided by the patient.  He has history of hypertension, coronary artery disease, Wolff-Parkinson-White syndrome. He states that about 2 hours ago, he noted that he felt like he was drowning if he was laying flat. He felt better sitting up. He coughed up some blood which was bright red. After that initial episode, haste continued to cough up small amount of pink sputum. He has had some mild left-sided chest pain but has difficulty describing it. He did have mild nausea and vomited once. He denies diaphoresis. He insists that he felt fine before this episode 2 hours ago. He does have a history of Parkinson White syndrome with cardiac ablations. He takes aspirin but no other anticoagulants or antiplatelet agents. He denies history of DVT 10 and denies recent travel. He does smoke 1.5 packs of cigarettes a day and drinks 5-6 beers a day. He denies illicit drug use. He did take an extra dose of furosemide and states that he has urinated twice and seems to feel better after each time he urinates.  Past Medical History:  Diagnosis Date  . Anxiety   . Arthritis   . CAD (coronary artery disease) 2007   50% stenosis small diagonal  . Central sleep apnea   . Chronic pain    since 1998 has been on chronic pain medication  . GERD (gastroesophageal reflux disease)   . Hemorrhoids   . Hepatomegaly 10/03/2014  . HTN (hypertension)   . Palpitations   . Syncope   . Tachycardia   . Tobacco abuse   . Tubular adenoma   . Wolff-Parkinson-White (WPW) syndrome    says was cured with ablation    Patient Active Problem List   Diagnosis Date Noted  . Stroke  ([place]) 10/28/2014  . Hypertensive emergency 10/28/2014  . Left sided numbness 10/28/2014  . Hepatomegaly 10/03/2014  . GERD (gastroesophageal reflux disease) 07/13/2013  . Hemorrhoids 07/13/2013  . Abdominal pain, chronic, right upper quadrant 05/03/2013  . Bowel habit changes 05/03/2013  . FH: colon cancer 05/03/2013  . Rectal bleeding 05/03/2013  . Hypokalemia 03/08/2013  . History of Wolff-Parkinson-White syndrome 03/08/2013  . Malignant hypertension 03/08/2013  . Polycythemia 03/08/2013  . Tobacco abuse 03/08/2013  . Chronic anxiety 03/08/2013  . Leukocytosis 03/08/2013  . Laceration of arm 03/08/2013  . Chest tightness 03/08/2013  . CAD (coronary artery disease) 08/11/2012  . Smoking 08/11/2012  . NIGHT SWEATS 10/23/2010  . ANXIETY STATE, UNSPECIFIED 12/23/2008  . ESSENTIAL HYPERTENSION, BENIGN 12/23/2008  . SYNCOPE 12/23/2008  . Palpitations 12/23/2008  . TACHYCARDIA, HX OF 12/23/2008    Past Surgical History:  Procedure Laterality Date  . ADENOIDECTOMY    . APPENDECTOMY    . ATRIAL ABLATION SURGERY    . AV NODE ABLATION    . BIOPSY N/A 05/12/2013   Procedure: BIOPSY;  Surgeon: Daneil Dolin, MD;  Location: [facility];  Service: Endoscopy;  Laterality: N/A;  . CARDIAC ELECTROPHYSIOLOGY STUDY AND ABLATION     SA node ablation  . COLONOSCOPY     age 26  . COLONOSCOPY WITH PROPOFOL N/A 05/12/2013  Dr. Gala Romney- hemorrhoids, tubular adenoma  . ESOPHAGOGASTRODUODENOSCOPY (EGD) WITH PROPOFOL N/A 05/12/2013   Dr. Gala Romney- hiatal hernia, erosive reflux esophagitis  . HAND SURGERY    . HEMORRHOID BANDING  06/22/13  . POLYPECTOMY N/A 05/12/2013   Procedure: POLYPECTOMY;  Surgeon: Daneil Dolin, MD;  Location: [facility];  Service: Endoscopy;  Laterality: N/A;  . SHOULDER SURGERY         Home Medications    Prior to Admission medications   Medication Sig Start Date End Date Taking? Authorizing Provider  acetaminophen (TYLENOL) 500 MG tablet Take 500-1,500 mg by mouth daily as  needed for pain.    Historical Provider, MD  amLODipine (NORVASC) 10 MG tablet Take 1 tablet (10 mg total) by mouth daily. 10/30/14   Ripudeep Krystal Eaton, MD  aspirin 325 MG tablet Take 1 tablet (325 mg total) by mouth daily. 10/30/14   Ripudeep Krystal Eaton, MD  atorvastatin (LIPITOR) 10 MG tablet Take 1 tablet (10 mg total) by mouth at bedtime. 10/30/14   Ripudeep Krystal Eaton, MD  cloNIDine (CATAPRES) 0.2 MG tablet Take 1 tablet (0.2 mg total) by mouth 2 (two) times daily. 10/30/14   Ripudeep Krystal Eaton, MD  hydrALAZINE (APRESOLINE) 25 MG tablet Take 1 tablet (25 mg total) by mouth 3 (three) times daily. 10/30/14   Ripudeep Krystal Eaton, MD  hydroxypropyl methylcellulose / hypromellose (ISOPTO TEARS / GONIOVISC) 2.5 % ophthalmic solution Place 1 drop into both eyes daily as needed for dry eyes.    Historical Provider, MD  metoprolol (TOPROL-XL) 100 MG 24 hr tablet Take 100 mg by mouth 2 (two) times daily.      Historical Provider, MD  mirtazapine (REMERON) 15 MG tablet Take 15 mg by mouth at bedtime as needed and may repeat dose one time if needed (for sleep).  03/01/13   Historical Provider, MD  Multiple Vitamins-Minerals (MENS MULTIVITAMIN PLUS PO) Take 1 tablet by mouth daily.    Historical Provider, MD  Oxycodone HCl 20 MG TABS Take 20 mg by mouth every 6 (six) hours as needed (Pain).  07/18/14   Historical Provider, MD  potassium chloride SA (K-DUR,KLOR-CON) 20 MEQ tablet Take 1 tablet (20 mEq total) by mouth daily. x3days 10/30/14   Ripudeep Krystal Eaton, MD  ranitidine (ZANTAC) 150 MG tablet Take 150 mg by mouth as needed. OTC medication    Historical Provider, MD    Family History Family History  Problem Relation Age of Onset  . Coronary artery disease Father     cabg  . Breast cancer Mother   . Colon cancer Father     age 57, chemo/surgery    Social History Social History  Substance Use Topics  . Smoking status: Current Every Day Smoker    Packs/day: 0.50    Years: 25.00    Types: Cigarettes  . Smokeless tobacco: Not  on file  . Alcohol use 1.2 oz/week    2 Cans of beer per week     Comment: daily     Allergies   Morphine   Review of Systems Review of Systems  All other systems reviewed and are negative.    Physical Exam Updated Vital Signs BP 140/96 (BP Location: Right Arm)   Pulse 99   Temp 97.9 F (36.6 C) (Oral)   Resp 17   Ht 6' (1.829 m)   Wt 215 lb (97.5 kg)   SpO2 96%   BMI 29.16 kg/m   Physical Exam  Nursing note and vitals reviewed.  45 year old male, resting comfortably and in no acute distress. Vital signs are significant for hypertension. Oxygen saturation is 96%, which is normal. Head is normocephalic and atraumatic. PERRLA, EOMI. Oropharynx is clear. Neck is nontender and supple without adenopathy or JVD. Back is nontender and there is no CVA tenderness. Lungs are clear without rales, wheezes, or rhonchi. Chest is nontender. Heart has regular rate and rhythm without murmur. Abdomen is soft, flat, nontender without masses or hepatosplenomegaly and peristalsis is normoactive. Extremities have no cyanosis or edema, full range of motion is present. Skin is warm and dry without rash. Neurologic: Mental status is normal, cranial nerves are intact, there are no motor or sensory deficits.   ED Treatments / Results  Labs (all labs ordered are listed, but only abnormal results are displayed) Labs Reviewed  TROPONIN I - Abnormal; Notable for the following:       Result Value   Troponin I 0.12 (*)    All other components within normal limits  BASIC METABOLIC PANEL - Abnormal; Notable for the following:    Potassium 3.3 (*)    Chloride 99 (*)    All other components within normal limits  D-DIMER, QUANTITATIVE (NOT AT [facility]) - Abnormal; Notable for the following:    D-Dimer, Quant <0.00 (*)    All other components within normal limits  CBC WITH DIFFERENTIAL/PLATELET - Abnormal; Notable for the following:    WBC 16.3 (*)    MCV 77.0 (*)    RDW 18.5 (*)    Platelets  417 (*)    Neutro Abs 12.8 (*)    Monocytes Absolute 1.1 (*)    All other components within normal limits  BRAIN NATRIURETIC PEPTIDE - Abnormal; Notable for the following:    B Natriuretic Peptide 292.3 (*)    All other components within normal limits    EKG  EKG Interpretation  Date/Time:  Friday April 11 2016 04:40:38 EDT Ventricular Rate:  97 PR Interval:    QRS Duration: 144 QT Interval:  398 QTC Calculation: 506 R Axis:   70 Text Interpretation:  Sinus rhythm Right atrial enlargement Right bundle branch block When compared with ECG of 10/28/2014, Nonspecific T wave abnormality is no longer Present Confirmed by Roxanne Mins  MD, Donnika Kucher ([id number]) on 04/11/2016 4:43:57 AM       Radiology Dg Chest 2 View  Result Date: 04/11/2016 CLINICAL DATA:  45 year old male with hemoptysis. Shortness of breath. EXAM: CHEST  2 VIEW COMPARISON:  Chest radiograph dated 03/08/2013 FINDINGS: Two views of the chest demonstrate increased interstitial prominence with Kerley B-lines compatible with interstitial edema. Right lung base hazy density, likely atelectatic changes. Pneumonia is less likely. Clinical correlation is recommended. There is no focal consolidation, pleural effusion, or pneumothorax. A 1 cm nodular appearing density in the right lower lung field most likely represent the nipple shadow. The cardiac silhouette is within normal limits. No acute osseous pathology. IMPRESSION: Mild interstitial edema.  No focal consolidation. Electronically Signed   By: Anner Crete M.D.   On: 04/11/2016 06:10   Procedures Procedures (including critical care time)  Medications Ordered in ED Medications  potassium chloride SA (K-DUR,KLOR-CON) CR tablet 40 mEq (not administered)  aspirin chewable tablet 324 mg (324 mg Oral Given 04/11/16 0655)  potassium chloride SA (K-DUR,KLOR-CON) CR tablet 40 mEq (40 mEq Oral Given 04/11/16 [id number])     Initial Impression / Assessment and Plan / ED Course  I have reviewed the  triage vital signs and the nursing notes.  Pertinent labs & imaging results that were available during my care of the patient were reviewed by me and considered in my medical decision making (see chart for details).  Clinical Course  Comment By Time  Mild elevation of troponin, BNP. Mild hypokalemia possibly related to recent diuretic use. Mild thrombocytosis pot, possibly reactive. Delora Fuel, MD [id number]    Orthopnea and hemoptysis. This could be related to congestive heart failure although there are no other physical findings to suggest that. He has no risk factors for pulmonary embolus on, but will screen with d-dimer. ECG shows no acute changes.  Chest x-ray does show evidence of mild pulmonary edema. BNP is only mildly elevated. Troponin is also mildly elevated. Mild hypokalemia is probably related to his extra dose of furosemide that he took. Mild thrombocytosis is probably reactive. I suspect that his hemoptysis and orthopnea 07 from CHF. The question is whether his troponin elevation is secondary to demand ischemia related to CHF, or this with her CHF is related to myocardial injury. In any event, given his hemoptysis, anticoagulation is withheld at this point. He was given aspirin. Case is discussed with Dr. Blaine Hamper of [facility] agrees to admit the patient under observation status.  Final Clinical Impressions(s) / ED Diagnoses   Final diagnoses:  Hemoptysis  Elevated troponin I level  Elevated brain natriuretic peptide (BNP) level  Hypokalemia  Thrombocytosis (HCC)    New Prescriptions New Prescriptions   No medications on file     Delora Fuel, MD [id number]

## 2016-04-11 NOTE — H&P (Signed)
History and Physical    Timothy P Thal Jr. [id number] DOB: 1970/11/06 DOA: 04/11/2016  PCP: Glo Herring., MD  Patient coming from:   Home    Chief Complaint: shortness of breath  HPI: Timothy Barry. is a 45 y.o. male with a medical history significant for, but not  limited to, WPW, HTN, non-obstructive CAD,OSA,  tobacco abuse and GERD.  Patient developed acute shortness of breath during the night. He was unable to lay flat and sleep. No chest pain though he admits to some very transient exertional chest pain associated with diaphoresis last week at work. Patient felt like he was drowning. He coughed up some bright red blood. Patient to double dose of Lasix equally 120 mg around 1 AM. After filling 3 urinals, patient was breathing much easier. Patient states he is compliant with blood pressure medications because if he does not take them his blood pressure will "go through the roof". He continues to smoke  Patient complains of left sided abdominal bulging, especially when standing. No abdominal pain. BMs regular. No history of hernias.   ED Course:  Afebrile, BP elevated initially 140 over 96, currently 133 over 87. Heart rate mid 80- 100. Normal oxygen saturations on room air Potassium 3.3, BNP 292, troponin 0.12 Meds administered in ED: Aspirin 324 mg, K Dur 40 mEq No acute findings on chest x-ray  Review of Systems: As per HPI, otherwise 10 point review of systems negative.    Past Medical History:  Diagnosis Date  . Anxiety   . Arthritis   . CAD (coronary artery disease) 2007   50% stenosis small diagonal  . Central sleep apnea   . Chronic pain    since 1998 has been on chronic pain medication  . GERD (gastroesophageal reflux disease)   . Hemorrhoids   . Hepatomegaly 10/03/2014  . HTN (hypertension)   . Palpitations   . Syncope   . Tachycardia   . Tobacco abuse   . Tubular adenoma   . Wolff-Parkinson-White (WPW) syndrome    says was cured with ablation     Past Surgical History:  Procedure Laterality Date  . ADENOIDECTOMY    . APPENDECTOMY    . ATRIAL ABLATION SURGERY    . AV NODE ABLATION    . BIOPSY N/A 05/12/2013   Procedure: BIOPSY;  Surgeon: Daneil Dolin, MD;  Location: [facility];  Service: Endoscopy;  Laterality: N/A;  . CARDIAC ELECTROPHYSIOLOGY STUDY AND ABLATION     SA node ablation  . COLONOSCOPY     age 67  . COLONOSCOPY WITH PROPOFOL N/A 05/12/2013   Dr. Gala Romney- hemorrhoids, tubular adenoma  . ESOPHAGOGASTRODUODENOSCOPY (EGD) WITH PROPOFOL N/A 05/12/2013   Dr. Gala Romney- hiatal hernia, erosive reflux esophagitis  . HAND SURGERY    . HEMORRHOID BANDING  06/22/13  . POLYPECTOMY N/A 05/12/2013   Procedure: POLYPECTOMY;  Surgeon: Daneil Dolin, MD;  Location: [facility];  Service: Endoscopy;  Laterality: N/A;  . SHOULDER SURGERY      Social History   Social History  . Marital status: Married    Spouse name: N/A  . Number of children: 2  . Years of education: N/A   Occupational History  . unemployed Unemployed   Social History Main Topics  . Smoking status: Current Every Day Smoker    Packs/day: 0.50    Years: 25.00    Types: Cigarettes  . Smokeless tobacco: Never Used  . Alcohol use 1.2 oz/week    2 Cans of  beer per week     Comment: daily  . Drug use: No  . Sexual activity: Yes    Birth control/ protection: None   Other Topics Concern  . Not on file   Social History Narrative  . No narrative on file  Married, lives at home with wife. No assistive devices needed for ambulation  Allergies  Allergen Reactions  . Morphine Other (See Comments)    Makes overly sleepy    Family History  Problem Relation Age of Onset  . Coronary artery disease Father     cabg  . Colon cancer Father     age 74, chemo/surgery  . Breast cancer Mother   . Hypertension Mother     Prior to Admission medications   Medication Sig Start Date End Date Taking? Authorizing Provider  aspirin 325 MG tablet Take 1 tablet (325 mg total)  by mouth daily. 10/30/14  Yes Ripudeep Krystal Eaton, MD  cloNIDine (CATAPRES) 0.2 MG tablet Take 1 tablet (0.2 mg total) by mouth 2 (two) times daily. 10/30/14  Yes Ripudeep Krystal Eaton, MD  furosemide (LASIX) 20 MG tablet Take 20 mg by mouth daily.   Yes Historical Provider, MD  metoprolol (TOPROL-XL) 100 MG 24 hr tablet Take 100 mg by mouth 2 (two) times daily.     Yes Historical Provider, MD  Oxycodone HCl 20 MG TABS Take 20 mg by mouth every 6 (six) hours as needed (Pain).  07/18/14  Yes Historical Provider, MD  potassium chloride SA (K-DUR,KLOR-CON) 20 MEQ tablet Take 1 tablet (20 mEq total) by mouth daily. x3days 10/30/14  Yes Ripudeep Krystal Eaton, MD  ranitidine (ZANTAC) 150 MG tablet Take 150 mg by mouth as needed. OTC medication   Yes Historical Provider, MD  amLODipine (NORVASC) 10 MG tablet Take 1 tablet (10 mg total) by mouth daily. Patient not taking: Reported on 04/11/2016 10/30/14   Ripudeep Krystal Eaton, MD  atorvastatin (LIPITOR) 10 MG tablet Take 1 tablet (10 mg total) by mouth at bedtime. Patient not taking: Reported on 04/11/2016 10/30/14   Ripudeep Krystal Eaton, MD  hydrALAZINE (APRESOLINE) 25 MG tablet Take 1 tablet (25 mg total) by mouth 3 (three) times daily. Patient not taking: Reported on 04/11/2016 10/30/14   Ripudeep Krystal Eaton, MD    Physical Exam: Vitals:   04/11/16 0645 04/11/16 0700 04/11/16 0730 04/11/16 0756  BP: 127/89 126/81 133/87 133/87  Pulse: 84 84 81 87  Resp: 20 23 19 18   Temp:    97.7 F (36.5 C)  TempSrc:    Oral  SpO2: 97% 96% 97% 100%  Weight:    97.5 kg (215 lb)  Height:    6' (1.829 m)    Constitutional:  Pleasant well developed white male in NAD, calm, comfortable Vitals:   04/11/16 0645 04/11/16 0700 04/11/16 0730 04/11/16 0756  BP: 127/89 126/81 133/87 133/87  Pulse: 84 84 81 87  Resp: 20 23 19 18   Temp:    97.7 F (36.5 C)  TempSrc:    Oral  SpO2: 97% 96% 97% 100%  Weight:    97.5 kg (215 lb)  Height:    6' (1.829 m)   Eyes: PER, lids and conjunctivae normal ENMT:  Mucous membranes are moist. Posterior pharynx clear of any exudate or lesions..  Neck: normal, supple, no masses Respiratory: clear to auscultation bilaterally, no wheezing, no crackles. Normal respiratory effort. No accessory muscle use.  Cardiovascular: Regular rate and rhythm, no murmurs / rubs / gallops. No extremity  edema. 2+ pedal pulses.   Abdomen: no tenderness, no masses palpated. No hepatomegaly. Bowel sounds positive.  Musculoskeletal: no clubbing / cyanosis. No joint deformity upper and lower extremities. Good ROM, no contractures. Normal muscle tone.  Skin: no rashes, lesions, ulcers, LUE tatoo  Neurologic: CN 2-12 grossly intact. Sensation intact, Strength 5/5 in all 4.  Psychiatric: Normal judgment and insight. Alert and oriented x 3. Normal mood.   Labs on Admission: I have personally reviewed following labs and imaging studies  Radiological Exams on Admission: Dg Chest 2 View  Result Date: 04/11/2016 CLINICAL DATA:  45 year old male with hemoptysis. Shortness of breath. EXAM: CHEST  2 VIEW COMPARISON:  Chest radiograph dated 03/08/2013 FINDINGS: Two views of the chest demonstrate increased interstitial prominence with Kerley B-lines compatible with interstitial edema. Right lung base hazy density, likely atelectatic changes. Pneumonia is less likely. Clinical correlation is recommended. There is no focal consolidation, pleural effusion, or pneumothorax. A 1 cm nodular appearing density in the right lower lung field most likely represent the nipple shadow. The cardiac silhouette is within normal limits. No acute osseous pathology. IMPRESSION: Mild interstitial edema.  No focal consolidation. Electronically Signed   By: Anner Crete M.D.   On: 04/11/2016 06:10   EKG: Independently reviewed.   EKG Interpretation  Date/Time:  Friday April 11 2016 04:40:38 EDT Ventricular Rate:  97 PR Interval:    QRS Duration: 144 QT Interval:  398 QTC Calculation: 506 R Axis:   70 Text  Interpretation:  Sinus rhythm Right atrial enlargement Right bundle branch block When compared with ECG of 10/28/2014, Nonspecific T wave abnormality is no longer Present Confirmed by [facility]  MD, DAVID ([id number]) on 04/11/2016 4:43:57 AM       Assessment/Plan   Active Problems:   Leukocytosis   Hemoptysis   Dyspnea   Essential hypertension, benign   Smoking   History of Wolff-Parkinson-White syndrome   Polycythemia   Tobacco abuse   GERD (gastroesophageal reflux disease)      Acute dyspnea at rest / orthopnea with associated interstitial edema on CXR, elevated BNP, elevated troponin suggesting heart failure. D-dimer negative.  No acute changes on EKG.  Last echo Feb 2016 - EF 50-55%.         -Place in observation- telemetry -Chest pain order set utilized -Acute heart failure order set utilized -Cardiology consult called. -Continue home Lasix tomorrow. Of note patient took double dose of Lasix at 1 AM (total of 120 mg) with improvement in dyspnea -Intake and output, daily weights -Follow-up on echocardiogram -Trend troponins -UDS  Hemoptysis. D-dimer <0.0 -interstitial edema on CXR. Current smoker.   Daily NSAID use - Goody powders  Hypertension. BP elevated in emergency department.  . -Will start with administering his home morning medications  Hypokalemia, likely secondary to lasix. Mg+ normal.  -Got 24mEq in ED -am bmet -daily K+ 85mEq  Tobacco abuse. Patient requesting NicoDerm patch   . -Will start NicoDerm patch and less cardiology feels strongly against this time -RN to give smoking cessation education  Leukocytosis, Etiology undetermined.  -check u/a -am cbc  Prolonged QTC interval on EKG, 506 -Potassium slightly low, replacement in progress. Magnesium is normal -Avoid QT prolonging medications -Monitor on tele  Polycythemia, ? Primary. Patient smokes. He has been phlebotomized on 3 occasions, last one about 8 months ago. Has not seen hematology since. Hgb  normal at 14.9.   Hyperlipidemia  -Continue on statin  GERD. Takes Zantac as needed.  -Willchange to daily dosing given  patient's frequent NSAID use-                                   DVT prophylaxis:   Lovenox Code Status:   Full code Family Communication:  Treatment plan discussed with wife in room and she understands and agrees with the plan..  Disposition Plan:  Discharge home  24-48 hours              Consults called:  Cardiology. Spoke with Rosaria Ferries, P.A.  Admission status:  Observation -Telemetry  Tye Savoy NP [facility] Pager [phone]  If 7PM-7AM, please contact night-coverage [email or web address] Password [email or web address]  04/11/2016, 8:14 AM

## 2016-04-11 NOTE — Progress Notes (Signed)
This is a no charge note  Pending admission per Dr. Roxanne Mins.   45 year old man with past medical history of WPW, hypertension, tobacco abuse, CAD, GERD, anxiety, who presents with a sudden onset chest pressure, short of breath and hemoptysis. Troponin 0.12, BNP 292, negative d-dimer. Chest x-ray showed interstitial edema. Unclear etiology. Per EDp, no leg edema or crackle. 324 mg of ASA was given. Pt self treated with lasix at home. Pt is accepted tele bed for obs.  Ivor Costa, MD  [facility] Pager [phone]  If 7PM-7AM, please contact night-coverage [email or web address] Password [facility] 04/11/2016, 6:38 AM

## 2016-04-12 ENCOUNTER — Observation Stay (HOSPITAL_BASED_OUTPATIENT_CLINIC_OR_DEPARTMENT_OTHER): Payer: Medicare Other

## 2016-04-12 DIAGNOSIS — R0601 Orthopnea: Secondary | ICD-10-CM

## 2016-04-12 DIAGNOSIS — R0989 Other specified symptoms and signs involving the circulatory and respiratory systems: Secondary | ICD-10-CM

## 2016-04-12 DIAGNOSIS — R0609 Other forms of dyspnea: Secondary | ICD-10-CM

## 2016-04-12 DIAGNOSIS — R609 Edema, unspecified: Secondary | ICD-10-CM

## 2016-04-12 DIAGNOSIS — R799 Abnormal finding of blood chemistry, unspecified: Secondary | ICD-10-CM | POA: Diagnosis not present

## 2016-04-12 DIAGNOSIS — F17200 Nicotine dependence, unspecified, uncomplicated: Secondary | ICD-10-CM

## 2016-04-12 DIAGNOSIS — R0600 Dyspnea, unspecified: Secondary | ICD-10-CM

## 2016-04-12 DIAGNOSIS — R7989 Other specified abnormal findings of blood chemistry: Secondary | ICD-10-CM

## 2016-04-12 DIAGNOSIS — I5033 Acute on chronic diastolic (congestive) heart failure: Secondary | ICD-10-CM

## 2016-04-12 DIAGNOSIS — K219 Gastro-esophageal reflux disease without esophagitis: Secondary | ICD-10-CM | POA: Diagnosis not present

## 2016-04-12 DIAGNOSIS — I10 Essential (primary) hypertension: Secondary | ICD-10-CM | POA: Diagnosis not present

## 2016-04-12 LAB — BASIC METABOLIC PANEL
Anion gap: 11 (ref 5–15)
BUN: 8 mg/dL (ref 6–20)
CALCIUM: 8.9 mg/dL (ref 8.9–10.3)
CO2: 29 mmol/L (ref 22–32)
CREATININE: 1.33 mg/dL — AB (ref 0.61–1.24)
Chloride: 94 mmol/L — ABNORMAL LOW (ref 101–111)
GFR calc non Af Amer: >60 mL/min (ref >60)
Glucose, Bld: 68 mg/dL (ref 65–99)
Potassium: 3.2 mmol/L — ABNORMAL LOW (ref 3.5–5.1)
SODIUM: 134 mmol/L — AB (ref 135–145)

## 2016-04-12 LAB — CBC
HCT: 45.2 % (ref 39.0–52.0)
Hemoglobin: 14.5 g/dL (ref 13.0–17.0)
MCH: 25.7 pg — AB (ref 26.0–34.0)
MCHC: 32.1 g/dL (ref 30.0–36.0)
MCV: 80.1 fL (ref 78.0–100.0)
PLATELETS: 371 10*3/uL (ref 150–400)
RBC: 5.64 MIL/uL (ref 4.22–5.81)
RDW: 19 % — ABNORMAL HIGH (ref 11.5–15.5)
WBC: 12 10*3/uL — ABNORMAL HIGH (ref 4.0–10.5)

## 2016-04-12 LAB — ECHOCARDIOGRAM COMPLETE
HEIGHTINCHES: 72 in
WEIGHTICAEL: 3358.4 [oz_av]

## 2016-04-12 MED ORDER — HYDRALAZINE HCL 25 MG PO TABS: 25.0000 mg | ORAL_TABLET | Freq: Two times a day (BID) | ORAL | Status: DC

## 2016-04-12 MED ORDER — NICOTINE 21 MG/24HR TD PT24: 21.0000 mg | MEDICATED_PATCH | Freq: Every day | TRANSDERMAL | 0 refills | Status: DC

## 2016-04-12 MED ORDER — ZOLPIDEM TARTRATE 5 MG PO TABS
5.0000 mg | ORAL_TABLET | Freq: Once | ORAL | Status: AC
Start: 2016-04-12 — End: 2016-04-12
  Administered 2016-04-12: 5 mg via ORAL
  Filled 2016-04-12: qty 1

## 2016-04-12 NOTE — Discharge Summary (Signed)
Physician Discharge Summary  Timothy [name]. [id number] DOB: [birth year] DOA: 04/11/2016  PCP: Glo Herring., MD  Admit date: 04/11/2016 Discharge date: 04/12/2016  Time spent: 35 minutes  Recommendations for Outpatient Follow-up:  1. Repeat BMET at follow up to assess electrolytes and renal function 2. Adjust diuretics and medications and antihypertensive drugs as needed  3. Repeat CBC to follow thrombocytosis and polycythemia   Discharge Diagnoses:  Active Problems:   Essential hypertension, benign   Smoking   History of Wolff-Parkinson-White syndrome   Polycythemia   Tobacco abuse   Leukocytosis   GERD (gastroesophageal reflux disease)   Hemoptysis   Orthopnea   Elevated troponin I level   Elevated brain natriuretic peptide (BNP) level   Acute on chronic diastolic heart failure (HCC)   Interstitial edema   Discharge Condition: stable and improved. Discharge home with instructions To follow up with PCP in 10 days and with cardiology service in 2 weeks  Diet recommendation: heart healthy diet Filed Weights   04/11/16 0447 04/11/16 0756 04/12/16 0039  Weight: 97.5 kg (215 lb) 97.5 kg (215 lb) 95.2 kg (209 lb 14.4 oz)    History of present illness:  As per H&P written by Dr. Waldron Labs and NP Tye Savoy on 04/11/16 45 y.o. male with a medical history significant for, but not  limited to, WPW, HTN, non-obstructive CAD,OSA,  tobacco abuse and GERD.  Patient developed acute shortness of breath during the night. He was unable to lay flat and sleep. No chest pain though he admits to some very transient exertional chest pain associated with diaphoresis last week at work. Patient felt like he was drowning. He coughed up some bright red blood. Patient to double dose of Lasix equally 120 mg around 1 AM. After filling 3 urinals, patient was breathing much easier. Patient states he is compliant with blood pressure medications because if he does not take them his blood pressure  will "go through the roof". He continues to smoke   Hospital Course:  Acute dyspnea at rest / orthopnea with associated interstitial edema on CXR, elevated BNP, elevated troponin suggesting acute on chronic diastolic heart failure and demand ischemia.  -D-dimer negative.   -No acute changes on EKG or telmetry.   -Last echo Feb 2016 - EF 50-55%. Repeated echo pending          -discussed with Dr. Lovena Le who recommend discharge and cardiologist at follow up as an outpatient -diet instruction provided (for low sodium specially) -Chest pain resolved and not further discomfort prior to discharge -Advise to follow low sodium diet, daily weight and to take medications as prescribed  -Follow-up on echocardiogram results during follow up visit with cardiology service -patient in no distress and looking to embrace new life style changes  Frothy blood tinge sputum: -secondary to interstitial edema/vas congestion CHF -neg D-dimer <0.0 -interstitial edema on CXR and no infiltrates  Hypertension.  -advise to follow low sodium diet -will resume home medication regimen (except for Amlodipine) -advise to minimize daily use of NSAIDs  Hypokalemia, likely secondary to extra lasix.  -repleted and WNL: at discharge -Mg within normal limits -continue daily maintenance   Tobacco abuse.  -cessation counseling provided -discharge on nicotine patch   Leukocytosis, Etiology undetermined.  -no fever, no signs of infection -possibly associated with smoking -trending down and close to normal range at discharge  Prolonged QTC interval on EKG, 506 -no abnormalities seen on telemetry  -denies lightheadedness and dizziness -electrolytes repleted and WNL at discharge -minimize  use of agents that can prolonged QT  Polycythemia, ? Primary. Patient smokes. He has been phlebotomized on 3 occasions, last one about 8 months ago.  -Has not seen hematology since. Hgb normal at 14.9 on admission.  -will  continue ASA -will benefit of outpatient follow up with Hematology service   Hyperlipidemia  -Continue on statin -encouraged to take medication  GERD. Takes Zantac as needed.  -advise to minimize use of NSAIDs -Follow up with PCP to determine needs of PPI   Procedures:  2-D echo : done and pending at discharge  Consultations:  Cardiology   Discharge Exam: Vitals:   04/11/16 1951 04/12/16 0415  BP: 118/70 125/77  Pulse: 71 75  Resp: 20 18  Temp: 97.9 F (36.6 C) 97.6 F (36.4 C)    General: afebrile, no further CP or SOB.  Cardiovascular: S1 and S2, no rubs or gallops; regular rate and sinus rhythm  Respiratory: good air movement, no wheezing, no crackles Abd: soft, NT, ND, positive BS Extremities: no edema or cyanosis   Discharge Instructions   Discharge Instructions    (HEART FAILURE PATIENTS) Call MD:  Anytime you have any of the following symptoms: 1) 3 pound weight gain in 24 hours or 5 pounds in 1 week 2) shortness of breath, with or without a dry hacking cough 3) swelling in the hands, feet or stomach 4) if you have to sleep on extra pillows at night in order to breathe.    Complete by:  As directed   Call MD for:  difficulty breathing, headache or visual disturbances    Complete by:  As directed   Call MD for:  persistant dizziness or light-headedness    Complete by:  As directed   Diet - low sodium heart healthy    Complete by:  As directed   Discharge instructions    Complete by:  As directed   Check weight on daily basis Follow a low sodium diet (less than 2.4 gram daily) Maintain adequate hydration  Take medications as prescribed Arrange follow up with PCP in 10 days Follow up with cardiologist in 2 weeks (Call Dr. Harl Bowie office for appointment details)     Current Discharge Medication List    START taking these medications   Details  nicotine (NICODERM CQ - DOSED IN MG/24 HOURS) 21 mg/24hr patch Place 1 patch (21 mg total) onto the skin  daily. Qty: 28 patch, Refills: 0      CONTINUE these medications which have CHANGED   Details  hydrALAZINE (APRESOLINE) 25 MG tablet Take 1 tablet (25 mg total) by mouth 2 (two) times daily.      CONTINUE these medications which have NOT CHANGED   Details  aspirin 325 MG tablet Take 1 tablet (325 mg total) by mouth daily. Qty: 30 tablet, Refills: 11    cloNIDine (CATAPRES) 0.2 MG tablet Take 1 tablet (0.2 mg total) by mouth 2 (two) times daily. Qty: 60 tablet, Refills: 1    furosemide (LASIX) 20 MG tablet Take 20 mg by mouth daily.    metoprolol (TOPROL-XL) 100 MG 24 hr tablet Take 100 mg by mouth 2 (two) times daily.      Oxycodone HCl 20 MG TABS Take 20 mg by mouth every 6 (six) hours as needed (Pain).  Refills: 0   Associated Diagnoses: Polycythemia, secondary; Leukocytosis    potassium chloride SA (K-DUR,KLOR-CON) 20 MEQ tablet Take 1 tablet (20 mEq total) by mouth daily. x3days Qty: 3 tablet, Refills: 0  ranitidine (ZANTAC) 150 MG tablet Take 150 mg by mouth as needed. OTC medication    atorvastatin (LIPITOR) 10 MG tablet Take 1 tablet (10 mg total) by mouth at bedtime. Qty: 30 tablet, Refills: 11      STOP taking these medications     amLODipine (NORVASC) 10 MG tablet        Allergies  Allergen Reactions  . Morphine Other (See Comments)    Makes overly sleepy   Follow-up Information    FUSCO,LAWRENCE J., MD. Schedule an appointment as soon as possible for a visit in 10 day(s).   Specialty:  Internal Medicine Contact information: [address] [place] [id number] [phone]        Carlyle Dolly, MD. Call in 2 week(s).   Specialty:  Cardiology Why:  contact office for appointment details. Contact information: [address]  [place] [address] [phone]           The results of significant diagnostics from this hospitalization (including imaging, microbiology, ancillary and laboratory) are listed below for reference.     Significant Diagnostic Studies: Dg Chest 2 View  Result Date: 04/11/2016 CLINICAL DATA:  45 year old male with hemoptysis. Shortness of breath. EXAM: CHEST  2 VIEW COMPARISON:  Chest radiograph dated 03/08/2013 FINDINGS: Two views of the chest demonstrate increased interstitial prominence with Kerley B-lines compatible with interstitial edema. Right lung base hazy density, likely atelectatic changes. Pneumonia is less likely. Clinical correlation is recommended. There is no focal consolidation, pleural effusion, or pneumothorax. A 1 cm nodular appearing density in the right lower lung field most likely represent the nipple shadow. The cardiac silhouette is within normal limits. No acute osseous pathology. IMPRESSION: Mild interstitial edema.  No focal consolidation. Electronically Signed   By: Anner Crete M.D.   On: 04/11/2016 06:10   Microbiology: No results found for this or any previous visit (from the past 240 hour(s)).   Labs: Basic Metabolic Panel:  Recent Labs Lab 04/11/16 0506 04/12/16 0223  NA 138 134*  K 3.3* 3.2*  CL 99* 94*  CO2 27 29  GLUCOSE 97 68  BUN 9 8  CREATININE 1.24 1.33*  CALCIUM 9.2 8.9   CBC:  Recent Labs Lab 04/11/16 0506 04/12/16 0223  WBC 16.3* 12.0*  NEUTROABS 12.8*  --   HGB 14.9 14.5  HCT 43.9 45.2  MCV 77.0* 80.1  PLT 417* 371   Cardiac Enzymes:  Recent Labs Lab 04/11/16 0506 04/11/16 0931 04/11/16 1432 04/11/16 2047  TROPONINI 0.12* 0.08* 0.06* 0.05*   BNP: BNP (last 3 results)  Recent Labs  04/11/16 0506  BNP 292.3*    Signed:  Barton Dubois MD.  [facility] 04/12/2016, 1:12 PM

## 2016-04-12 NOTE — Progress Notes (Signed)
Patient ID: Raeanne Barry., male   DOB: August 29, 1971, 45 y.o.   MRN: [id number]    Patient Name: Timothy Ede. Date of Encounter: 04/12/2016     Active Problems:   Essential hypertension, benign   Smoking   History of Wolff-Parkinson-White syndrome   Polycythemia   Tobacco abuse   Leukocytosis   GERD (gastroesophageal reflux disease)   Hemoptysis   Dyspnea   Elevated troponin I level    SUBJECTIVE  No chest pain or sob. "I want to go home, I'm ok."  CURRENT MEDS . aspirin  325 mg Oral Daily  . cloNIDine  0.2 mg Oral BID  . enoxaparin (LOVENOX) injection  40 mg Subcutaneous Q24H  . famotidine  20 mg Oral Daily  . furosemide  20 mg Oral Daily  . guaiFENesin  600 mg Oral BID  . metoprolol succinate  100 mg Oral BID  . nicotine  21 mg Transdermal Daily  . potassium chloride  20 mEq Oral Daily  . sodium chloride flush  3 mL Intravenous Q12H    OBJECTIVE  Vitals:   04/11/16 1532 04/11/16 1951 04/12/16 0039 04/12/16 0415  BP: 112/65 118/70  125/77  Pulse: 72 71  75  Resp: 18 20  18   Temp: 98.4 F (36.9 C) 97.9 F (36.6 C)  97.6 F (36.4 C)  TempSrc: Oral Oral  Oral  SpO2: 99% 100%  99%  Weight:   209 lb 14.4 oz (95.2 kg)   Height:        Intake/Output Summary (Last 24 hours) at 04/12/16 0947 Last data filed at 04/12/16 0841  Gross per 24 hour  Intake              570 ml  Output              700 ml  Net             -130 ml   Filed Weights   04/11/16 0447 04/11/16 0756 04/12/16 0039  Weight: 215 lb (97.5 kg) 215 lb (97.5 kg) 209 lb 14.4 oz (95.2 kg)    PHYSICAL EXAM  General: Pleasant, NAD. Neuro: Alert and oriented X 3. Moves all extremities spontaneously. Psych: Normal affect. HEENT:  Normal  Neck: Supple without bruits or JVD. Lungs:  Resp regular and unlabored, CTA. Heart: RRR no s3, s4, or murmurs. Abdomen: Soft, non-tender, non-distended, BS + x 4.  Extremities: No clubbing, cyanosis or edema. DP/PT/Radials 2+ and equal  bilaterally.  Accessory Clinical Findings  CBC  Recent Labs  04/11/16 0506 04/12/16 0223  WBC 16.3* 12.0*  NEUTROABS 12.8*  --   HGB 14.9 14.5  HCT 43.9 45.2  MCV 77.0* 80.1  PLT 417* 123456   Basic Metabolic Panel  Recent Labs  04/11/16 0506 04/12/16 0223  NA 138 134*  K 3.3* 3.2*  CL 99* 94*  CO2 27 29  GLUCOSE 97 68  BUN 9 8  CREATININE 1.24 1.33*  CALCIUM 9.2 8.9   Liver Function Tests No results for input(s): AST, ALT, ALKPHOS, BILITOT, PROT, ALBUMIN in the last 72 hours. No results for input(s): LIPASE, AMYLASE in the last 72 hours. Cardiac Enzymes  Recent Labs  04/11/16 0931 04/11/16 1432 04/11/16 2047  TROPONINI 0.08* 0.06* 0.05*   BNP Invalid input(s): POCBNP D-Dimer  Recent Labs  04/11/16 0506  DDIMER <0.00*   Hemoglobin A1C No results for input(s): HGBA1C in the last 72 hours. Fasting Lipid Panel No results for input(s): CHOL, HDL,  LDLCALC, TRIG, CHOLHDL, LDLDIRECT in the last 72 hours. Thyroid Function Tests No results for input(s): TSH, T4TOTAL, T3FREE, THYROIDAB in the last 72 hours.  Invalid input(s): FREET3  TELE  nsr with RBBB  Radiology/Studies  Dg Chest 2 View  Result Date: 04/11/2016 CLINICAL DATA:  45 year old male with hemoptysis. Shortness of breath. EXAM: CHEST  2 VIEW COMPARISON:  Chest radiograph dated 03/08/2013 FINDINGS: Two views of the chest demonstrate increased interstitial prominence with Kerley B-lines compatible with interstitial edema. Right lung base hazy density, likely atelectatic changes. Pneumonia is less likely. Clinical correlation is recommended. There is no focal consolidation, pleural effusion, or pneumothorax. A 1 cm nodular appearing density in the right lower lung field most likely represent the nipple shadow. The cardiac silhouette is within normal limits. No acute osseous pathology. IMPRESSION: Mild interstitial edema.  No focal consolidation. Electronically Signed   By: Anner Crete M.D.   On:  04/11/2016 06:10   ASSESSMENT AND PLAN  1. Sob - resolved with lasix. He is now euvolemic. Almedia for DC home. He admits to dietary indiscretion with sodium. We discussed ways of avoiding eating too much salt. 2. H/o WPW, s/p ablation - he still has palpitations but no evidence of recurrent SVT. He is not pre-excited. 3. Palpitations - these have resolved.  4. Disp. - He is stable for DC home on his home meds with a low salt diet. He does not need to wait for a 2D echo. This can be done as an outpatient.  Gregg Taylor,M.D.  04/12/2016 9:47 AM

## 2016-04-12 NOTE — Progress Notes (Signed)
  Echocardiogram 2D Echocardiogram has been performed.  Tresa Res 04/12/2016, 12:55 PM

## 2016-04-18 ENCOUNTER — Ambulatory Visit (INDEPENDENT_AMBULATORY_CARE_PROVIDER_SITE_OTHER): Payer: Medicare Other | Admitting: Cardiology

## 2016-04-18 ENCOUNTER — Encounter: Payer: Self-pay | Admitting: Cardiology

## 2016-04-18 DIAGNOSIS — I429 Cardiomyopathy, unspecified: Secondary | ICD-10-CM

## 2016-04-18 DIAGNOSIS — I2583 Coronary atherosclerosis due to lipid rich plaque: Secondary | ICD-10-CM

## 2016-04-18 DIAGNOSIS — I509 Heart failure, unspecified: Secondary | ICD-10-CM | POA: Diagnosis not present

## 2016-04-18 DIAGNOSIS — Z8249 Family history of ischemic heart disease and other diseases of the circulatory system: Secondary | ICD-10-CM | POA: Insufficient documentation

## 2016-04-18 DIAGNOSIS — Z8679 Personal history of other diseases of the circulatory system: Secondary | ICD-10-CM

## 2016-04-18 DIAGNOSIS — I5021 Acute systolic (congestive) heart failure: Secondary | ICD-10-CM

## 2016-04-18 DIAGNOSIS — Z6828 Body mass index (BMI) 28.0-28.9, adult: Secondary | ICD-10-CM | POA: Diagnosis not present

## 2016-04-18 DIAGNOSIS — I2510 Atherosclerotic heart disease of native coronary artery without angina pectoris: Secondary | ICD-10-CM | POA: Diagnosis not present

## 2016-04-18 DIAGNOSIS — I10 Essential (primary) hypertension: Secondary | ICD-10-CM

## 2016-04-18 DIAGNOSIS — Z720 Tobacco use: Secondary | ICD-10-CM

## 2016-04-18 DIAGNOSIS — E785 Hyperlipidemia, unspecified: Secondary | ICD-10-CM | POA: Insufficient documentation

## 2016-04-18 DIAGNOSIS — K219 Gastro-esophageal reflux disease without esophagitis: Secondary | ICD-10-CM | POA: Diagnosis not present

## 2016-04-18 DIAGNOSIS — F1010 Alcohol abuse, uncomplicated: Secondary | ICD-10-CM

## 2016-04-18 DIAGNOSIS — I4510 Unspecified right bundle-branch block: Secondary | ICD-10-CM

## 2016-04-18 MED ORDER — CARVEDILOL 12.5 MG PO TABS: 12.5000 mg | ORAL_TABLET | Freq: Two times a day (BID) | ORAL | 11 refills | Status: DC

## 2016-04-18 MED ORDER — RAMIPRIL 10 MG PO CAPS: 10.0000 mg | ORAL_CAPSULE | Freq: Every day | ORAL | 11 refills | Status: DC

## 2016-04-18 MED ORDER — ATORVASTATIN CALCIUM 10 MG PO TABS: 10.0000 mg | ORAL_TABLET | Freq: Every day | ORAL | 11 refills | Status: DC

## 2016-04-18 NOTE — Assessment & Plan Note (Signed)
F had an MI in his 26's

## 2016-04-18 NOTE — Assessment & Plan Note (Signed)
Controlled.  

## 2016-04-18 NOTE — Assessment & Plan Note (Signed)
Last LDL 126- he couldn't afford statin Rx

## 2016-04-18 NOTE — Patient Instructions (Signed)
Medication Instructions:  Your physician has recommended you make the following change in your medication:  1) STOP Hydralazine 2) STOP Metoprolol 3) START Carvedilol 12.5 mg twice daily 4) START Altace 10 mg daily  Labwork: None ordered  Testing/Procedures: None ordered  Follow-Up: Your physician recommends that you schedule a follow-up appointment in: 8/10 @ 3:45 pm with [name], PA.   Thank you for choosing [facility]!!

## 2016-04-18 NOTE — Progress Notes (Signed)
04/18/2016 Timothy Miss Jr.   11-16-1970  [id number]  Primary Physician Timothy [name]., MD Primary Cardiologist: Dr Timothy [name]  HPI:  45 year old Caucasian male with a past medical history of HTN, CHF, Wolff-Parkinson-White syndrome (s/p ablation x 3 at [facility]), sleep apnea with noncompliance to CPAP, and non obstructive CAD. He presented to the ED on 04/11/16 with acute CHF and chest pain. He has had a heart catheterization in the past. The last one was in 2007. At that time he had 50% stenosis in his LAD. His last echocardiogram was in February 2016, his EF was 50-55% and he had some mild mitral valve regurgitation. His Troponin were mildly elevated. He says he had tachycardia at home before admission but he had no significant arrhythmia in the hospital. He was admitted and diuresed. Echo on the day of discharge revealed an EF of 35-40% with diffuse HK and grade 2 DD. He is in the office today for follow up. He admitted to me that he has been rationing his medications secondary to cost. He was only taking half his clonidine tablets, no Lipitor, and only half his Toprol dose.    Current Outpatient Prescriptions  Medication Sig Dispense Refill  . aspirin 325 MG tablet Take 1 tablet (325 mg total) by mouth daily. 30 tablet 11  . atorvastatin (LIPITOR) 10 MG tablet Take 1 tablet (10 mg total) by mouth at bedtime. 30 tablet 11  . cloNIDine (CATAPRES) 0.2 MG tablet Take 1 tablet (0.2 mg total) by mouth 2 (two) times daily. 60 tablet 1  . furosemide (LASIX) 20 MG tablet Take 20 mg by mouth daily.    . nicotine (NICODERM CQ - DOSED IN MG/24 HOURS) 21 mg/24hr patch Place 1 patch (21 mg total) onto the skin daily. 28 patch 0  . Oxycodone HCl 20 MG TABS Take 20 mg by mouth every 6 (six) hours as needed (Pain).   0  . potassium chloride SA (K-DUR,KLOR-CON) 20 MEQ tablet Take 1 tablet (20 mEq total) by mouth daily. x3days 3 tablet 0  . ranitidine (ZANTAC) 150 MG tablet Take 150 mg by mouth as needed. OTC  medication    . carvedilol (COREG) 12.5 MG tablet Take 1 tablet (12.5 mg total) by mouth 2 (two) times daily. 60 tablet 11  . ramipril (ALTACE) 10 MG capsule Take 1 capsule (10 mg total) by mouth daily. 30 capsule 11   No current facility-administered medications for this visit.     Allergies  Allergen Reactions  . Morphine Other (See Comments)    Makes overly sleepy    Social History   Social History  . Marital status: Married    Spouse name: N/A  . Number of children: 2  . Years of education: N/A   Occupational History  . unemployed Unemployed   Social History Main Topics  . Smoking status: Current Every Day Smoker    Packs/day: 0.50    Years: 25.00    Types: Cigarettes  . Smokeless tobacco: Never Used  . Alcohol use 1.2 oz/week    2 Cans of beer per week     Comment: daily  . Drug use: No  . Sexual activity: Yes    Birth control/ protection: None   Other Topics Concern  . Not on file   Social History Narrative  . No narrative on file     Review of Systems: General: negative for chills, fever, night sweats or weight changes.  Cardiovascular: negative for chest pain,  dyspnea on exertion, edema, orthopnea, palpitations, paroxysmal nocturnal dyspnea or shortness of breath Dermatological: negative for rash Respiratory: negative for cough or wheezing Urologic: negative for hematuria Abdominal: negative for nausea, vomiting, diarrhea, bright red blood per rectum, melena, or hematemesis Neurologic: negative for visual changes, syncope, or dizziness All other systems reviewed and are otherwise negative except as noted above.    Blood pressure 128/80, pulse 79, height 6' (1.829 m), weight 206 lb (93.4 kg), SpO2 98 %.  General appearance: alert, cooperative and no distress Neck: no carotid bruit and no JVD Lungs: clear to auscultation bilaterally Heart: regular rate and rhythm Extremities: extremities normal, atraumatic, no cyanosis or edema Skin: Skin color,  texture, turgor normal. No rashes or lesions Neurologic: Grossly normal   ASSESSMENT AND PLAN:   Acute CHF (congestive heart failure) (HCC) Pt admitted from home via EMS for acute CHF. He admitted he has been rationing his medications prior to this.   CAD (coronary artery disease) 50% LAD in 2007  Essential hypertension Controlled  History of Wolff-Parkinson-White syndrome Prior RFA at [facility]. He did have several minutes of tachycardia at home before admission but no documented arrhythmia in the hospital  Tobacco abuse .  ETOH abuse "2 beers a day"  RBBB .  Cardiomyopathy ([place]) Etiology not yet determined  Family history of coronary artery disease in father F had an MI in his 69's  Dyslipidemia Last LDL 126- he couldn't afford statin Rx   PLAN  I stopped his hydralazine and added Altace 10 mg. I stopped his Toprol and added Coreg 12.5 mg BID. I asked him to return next week for a BMP and B/P check. I will taper off his clonidine at that time. I'll discuss with Dr Timothy [name] further work up- Myoview vs Cath-m for his new cardiomyopathy.   Timothy Ransom PA-C 04/18/2016 4:30 PM

## 2016-04-18 NOTE — Assessment & Plan Note (Signed)
Etiology not yet determined 

## 2016-04-18 NOTE — Assessment & Plan Note (Signed)
Prior RFA at [facility]. He did have several minutes of tachycardia at home before admission but no documented arrhythmia in the hospital

## 2016-04-18 NOTE — Assessment & Plan Note (Signed)
"  2 beers a day"

## 2016-04-18 NOTE — Assessment & Plan Note (Signed)
50% LAD in 2007

## 2016-04-18 NOTE — Assessment & Plan Note (Signed)
Pt admitted from home via EMS for acute CHF. He admitted he has been rationing his medications prior to this.

## 2016-04-24 ENCOUNTER — Ambulatory Visit: Payer: Medicare Other | Admitting: Cardiology

## 2016-04-24 DIAGNOSIS — R0989 Other specified symptoms and signs involving the circulatory and respiratory systems: Secondary | ICD-10-CM

## 2016-04-25 ENCOUNTER — Other Ambulatory Visit: Payer: Self-pay | Admitting: Cardiology

## 2016-04-25 ENCOUNTER — Telehealth: Payer: Self-pay | Admitting: Cardiology

## 2016-04-25 DIAGNOSIS — I5022 Chronic systolic (congestive) heart failure: Secondary | ICD-10-CM

## 2016-04-25 NOTE — Telephone Encounter (Signed)
Left message to call back and discuss Rt and Lt heart cath.  [name] PA-C 04/25/2016 8:37 AM

## 2016-04-30 ENCOUNTER — Ambulatory Visit (HOSPITAL_COMMUNITY)
Admission: RE | Admit: 2016-04-30 | Payer: Medicare Other | Source: Ambulatory Visit | Admitting: Interventional Cardiology

## 2016-04-30 ENCOUNTER — Encounter (HOSPITAL_COMMUNITY): Admission: RE | Payer: Self-pay | Source: Ambulatory Visit

## 2016-04-30 SURGERY — RIGHT/LEFT HEART CATH AND CORONARY ANGIOGRAPHY
Anesthesia: LOCAL

## 2016-05-05 ENCOUNTER — Other Ambulatory Visit: Payer: Self-pay | Admitting: Adult Health

## 2016-05-05 ENCOUNTER — Ambulatory Visit (INDEPENDENT_AMBULATORY_CARE_PROVIDER_SITE_OTHER): Payer: Medicare Other | Admitting: Adult Health

## 2016-05-05 ENCOUNTER — Encounter: Payer: Self-pay | Admitting: Adult Health

## 2016-05-05 ENCOUNTER — Encounter: Payer: Self-pay | Admitting: *Deleted

## 2016-05-05 VITALS — BP 130/76 | HR 93 | Ht 72.0 in | Wt 208.0 lb

## 2016-05-05 DIAGNOSIS — I5041 Acute combined systolic (congestive) and diastolic (congestive) heart failure: Secondary | ICD-10-CM | POA: Diagnosis not present

## 2016-05-05 DIAGNOSIS — I10 Essential (primary) hypertension: Secondary | ICD-10-CM

## 2016-05-05 DIAGNOSIS — I519 Heart disease, unspecified: Secondary | ICD-10-CM | POA: Diagnosis not present

## 2016-05-05 DIAGNOSIS — Z01818 Encounter for other preprocedural examination: Secondary | ICD-10-CM

## 2016-05-05 DIAGNOSIS — I2510 Atherosclerotic heart disease of native coronary artery without angina pectoris: Secondary | ICD-10-CM

## 2016-05-05 NOTE — Progress Notes (Signed)
Cardiology Office Note   Date:  05/05/2016   ID:  Timothy Windmiller., DOB July 07, 1971, MRN [id number]  PCP:  Glo Herring., MD  Cardiologist: Cloria Spring, NP   No chief complaint on file.     History of Present Illness: Timothy Vicini. is a 45 y.o. male who presents for ongoing assessment and management of hypertension, CHF, Wolff-Parkinson-White-White syndrome (status post ablation x3 at [facility]. Other history includes obstructive sleep apnea with noncompliance of CPAP.  He was last sin the office by [name], PA, after ER visit for acute systolic CHF, with EF during hospitalization revealing LV of 35-40% with diffuse hypokinesis and grade 2 diastolic dysfunction. During the office visit he admitted that he was rationing his medications secondary to cost, only taking half of his clonidine tablets, not taking Lipitor, and only half of his metoprolol dose. He was to be scheduled for a right and left heart catheterization.  He has not been set up for RHC/LHC at this time. He is continuing to feel weak when he takes his medications as directed and therefore continues to take lower doses at home to save money.He has stopped taking clonidine altogether. He continues to smoke but is working on quitting and is actively trying to cut down. He is very anxious and emotional about his current health status.    Past Medical History:  Diagnosis Date  . Anxiety   . Arthritis   . CAD (coronary artery disease) 2007   50% stenosis small diagonal  . Central sleep apnea   . Chronic pain    since 1998 has been on chronic pain medication  . GERD (gastroesophageal reflux disease)   . Hemorrhoids   . Hepatomegaly 10/03/2014  . HTN (hypertension)   . Palpitations   . Syncope   . Tachycardia   . Tobacco abuse   . Tubular adenoma   . Wolff-Parkinson-White (WPW) syndrome    says was cured with ablation    Past Surgical History:  Procedure Laterality Date  .  ADENOIDECTOMY    . APPENDECTOMY    . ATRIAL ABLATION SURGERY    . AV NODE ABLATION    . BIOPSY N/A 05/12/2013   Procedure: BIOPSY;  Surgeon: Daneil Dolin, MD;  Location: [facility];  Service: Endoscopy;  Laterality: N/A;  . CARDIAC ELECTROPHYSIOLOGY STUDY AND ABLATION     SA node ablation  . COLONOSCOPY     age 42  . COLONOSCOPY WITH PROPOFOL N/A 05/12/2013   Dr. Gala Romney- hemorrhoids, tubular adenoma  . ESOPHAGOGASTRODUODENOSCOPY (EGD) WITH PROPOFOL N/A 05/12/2013   Dr. Gala Romney- hiatal hernia, erosive reflux esophagitis  . HAND SURGERY    . HEMORRHOID BANDING  06/22/13  . POLYPECTOMY N/A 05/12/2013   Procedure: POLYPECTOMY;  Surgeon: Daneil Dolin, MD;  Location: [facility];  Service: Endoscopy;  Laterality: N/A;  . SHOULDER SURGERY       Current Outpatient Prescriptions  Medication Sig Dispense Refill  . aspirin 325 MG tablet Take 1 tablet (325 mg total) by mouth daily. 30 tablet 11  . atorvastatin (LIPITOR) 10 MG tablet Take 1 tablet (10 mg total) by mouth at bedtime. 30 tablet 11  . carvedilol (COREG) 12.5 MG tablet Take 1 tablet (12.5 mg total) by mouth 2 (two) times daily. (Patient taking differently: Take 12.5 mg by mouth daily. ) 60 tablet 11  . furosemide (LASIX) 20 MG tablet Take 20 mg by mouth daily.    . nicotine (NICODERM CQ -  DOSED IN MG/24 HOURS) 21 mg/24hr patch Place 1 patch (21 mg total) onto the skin daily. 28 patch 0  . Oxycodone HCl 20 MG TABS Take 20 mg by mouth every 6 (six) hours as needed (Pain).   0  . potassium chloride SA (K-DUR,KLOR-CON) 20 MEQ tablet Take 1 tablet (20 mEq total) by mouth daily. x3days 3 tablet 0  . ramipril (ALTACE) 10 MG capsule Take 1 capsule (10 mg total) by mouth daily. 30 capsule 11  . ranitidine (ZANTAC) 150 MG tablet Take 150 mg by mouth as needed. OTC medication     No current facility-administered medications for this visit.     Allergies:   Morphine    Social History:  The patient  reports that he has been smoking Cigarettes.  He has a  12.50 pack-year smoking history. He has never used smokeless tobacco. He reports that he drinks about 1.2 oz of alcohol per week . He reports that he does not use drugs.   Family History:  The patient's family history includes Breast cancer in his mother; Colon cancer in his father; Coronary artery disease in his father; Hypertension in his mother.    ROS: All other systems are reviewed and negative. Unless otherwise mentioned in H&P    PHYSICAL EXAM: VS:  BP 130/76   Pulse 93   Ht 6' (1.829 m)   Wt 208 lb (94.3 kg)   SpO2 97%   BMI 28.21 kg/m  , BMI Body mass index is 28.21 kg/m. GEN: Well nourished, well developed, in no acute distress  HEENT: normal  Neck: no JVD, carotid bruits, or masses Cardiac: RRR; distant heart sounds, no murmurs, rubs, or gallops,no edema  Respiratory:  clear to auscultation bilaterally, normal work of breathing GI: soft, nontender, nondistended, + BS MS: no deformity or atrophy  Skin: warm and dry, no rash Neuro:  Strength and sensation are intact Psych: euthymic mood, full affect   EKG 04/11/2016: RBBB, rate of 90 bpm.   Recent Labs: 04/11/2016: B Natriuretic Peptide 292.3 04/12/2016: BUN 8; Creatinine, Ser 1.33; Hemoglobin 14.5; Platelets 371; Potassium 3.2; Sodium 134    Lipid Panel    Component Value Date/Time   CHOL 187 10/29/2014 0858   TRIG 102 10/29/2014 0858   HDL 38 (L) 10/29/2014 0858   CHOLHDL 4.9 10/29/2014 0858   VLDL 20 10/29/2014 0858   LDLCALC 129 (H) 10/29/2014 0858      Wt Readings from Last 3 Encounters:  05/05/16 208 lb (94.3 kg)  04/18/16 206 lb (93.4 kg)  04/12/16 209 lb 14.4 oz (95.2 kg)      Other studies Reviewed: Additional studies/ records that were reviewed today include: Ehcocardiogram Review of the above records demonstrates:  04/12/2016  Left ventricle: The cavity size was normal. Wall thickness was   normal. Systolic function was moderately reduced. The estimated   ejection fraction was in the range  of 35% to 40%. Moderate   diffuse hypokinesis with no identifiable regional variations.   Features are consistent with a pseudonormal left ventricular   filling pattern, with concomitant abnormal relaxation and   increased filling pressure (grade 2 diastolic dysfunction). - Mitral valve: There was mild regurgitation. - Left atrium: The atrium was moderately dilated. - Right atrium: The atrium was mildly dilated.   ASSESSMENT AND PLAN:  1. 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 He was not initially sent for RHC/LHC in this setting  due to hypertension with need for medical management and confirmation of compliance.   He continues to struggle with paying of medications, and will be referred to [facility] Amass for medication assistance. He will also be referred to [facility] Patient Assistance for help with billing for his procedures and hospitalization. 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 I have asked him not to lift or carry any objects over 10 lbs.   He is willing to proceed with cardiac cath for diagnostic prognostic purposes. He states that he has never used illicit drugs.   2. Hypertension: BP is not optimal for current LV fx. I have advised him to take coreg as directed along with his other medications. Will not restart clonidine at this time. I have advised him that he may feel worse before he feels better when resuming the medications as dose directed regimen. He verbalizes understanding.   3. CAD: Most recent cardiac cath in 2007 with disease of small diagonal per history. Will await cath results Continue statin and ASA.    Current medicines are reviewed at length with the patient today.    Labs/ tests ordered today include: RHC/LHC  No orders of the defined types were placed in this encounter.    Disposition:   FU with cardiology post cardiac cath.  Signed, Jory Sims, NP   05/05/2016 4:28 PM    Timothy [name] [address], [place], [place] [address] Phone: [phone]; Fax: [phone]

## 2016-05-05 NOTE — Progress Notes (Signed)
Name: Timothy [name].    DOB: 07-27-1971  Age: 45 y.o.  MR#: [id number]       PCP:  Glo Herring., MD      Insurance: Payor: MEDICARE / Plan: MEDICARE PART A AND B / Product Type: *No Product type* /   CC:   No chief complaint on file.   VS Vitals:   05/05/16 1556  BP: 130/76  Pulse: 93  SpO2: 97%  Weight: 208 lb (94.3 kg)  Height: 6' (1.829 m)    Weights Current Weight  05/05/16 208 lb (94.3 kg)  04/18/16 206 lb (93.4 kg)  04/12/16 209 lb 14.4 oz (95.2 kg)    Blood Pressure  BP Readings from Last 3 Encounters:  05/05/16 130/76  04/18/16 128/80  04/12/16 125/77     Admit date:  (Not on file) Last encounter with RMR:  Visit date not found   Allergy Morphine  Current Outpatient Prescriptions  Medication Sig Dispense Refill  . aspirin 325 MG tablet Take 1 tablet (325 mg total) by mouth daily. 30 tablet 11  . atorvastatin (LIPITOR) 10 MG tablet Take 1 tablet (10 mg total) by mouth at bedtime. 30 tablet 11  . carvedilol (COREG) 12.5 MG tablet Take 1 tablet (12.5 mg total) by mouth 2 (two) times daily. (Patient taking differently: Take 12.5 mg by mouth daily. ) 60 tablet 11  . furosemide (LASIX) 20 MG tablet Take 20 mg by mouth daily.    . nicotine (NICODERM CQ - DOSED IN MG/24 HOURS) 21 mg/24hr patch Place 1 patch (21 mg total) onto the skin daily. 28 patch 0  . Oxycodone HCl 20 MG TABS Take 20 mg by mouth every 6 (six) hours as needed (Pain).   0  . potassium chloride SA (K-DUR,KLOR-CON) 20 MEQ tablet Take 1 tablet (20 mEq total) by mouth daily. x3days 3 tablet 0  . ramipril (ALTACE) 10 MG capsule Take 1 capsule (10 mg total) by mouth daily. 30 capsule 11  . ranitidine (ZANTAC) 150 MG tablet Take 150 mg by mouth as needed. OTC medication     No current facility-administered medications for this visit.     Discontinued Meds:    Medications Discontinued During This Encounter  Medication Reason  . cloNIDine (CATAPRES) 0.2 MG tablet Error    Patient Active  Problem List   Diagnosis Date Noted  . Acute CHF (congestive heart failure) ([place]) 04/18/2016  . ETOH abuse 04/18/2016  . RBBB 04/18/2016  . Cardiomyopathy ([place]) 04/18/2016  . Family history of coronary artery disease in father 04/18/2016  . Dyslipidemia 04/18/2016  . Elevated brain natriuretic peptide (BNP) level   . Acute on chronic diastolic heart failure ([place])   . Interstitial edema   . Hemoptysis 04/11/2016  . Orthopnea 04/11/2016  . Elevated troponin I level   . Stroke ([place]) 10/28/2014  . Hypertensive emergency 10/28/2014  . Left sided numbness 10/28/2014  . Hepatomegaly 10/03/2014  . GERD (gastroesophageal reflux disease) 07/13/2013  . Hemorrhoids 07/13/2013  . Abdominal pain, chronic, right upper quadrant 05/03/2013  . Bowel habit changes 05/03/2013  . FH: colon cancer 05/03/2013  . Rectal bleeding 05/03/2013  . Hypokalemia 03/08/2013  . History of Wolff-Parkinson-White syndrome 03/08/2013  . Malignant hypertension 03/08/2013  . Polycythemia 03/08/2013  . Tobacco abuse 03/08/2013  . Chronic anxiety 03/08/2013  . Leukocytosis 03/08/2013  . Laceration of arm 03/08/2013  . Chest tightness 03/08/2013  . CAD (coronary artery disease) 08/11/2012  . Smoking 08/11/2012  . NIGHT SWEATS  10/23/2010  . ANXIETY STATE, UNSPECIFIED 12/23/2008  . Essential hypertension 12/23/2008  . SYNCOPE 12/23/2008  . Palpitations 12/23/2008  . TACHYCARDIA, HX OF 12/23/2008    LABS    Component Value Date/Time   NA 134 (L) 04/12/2016 0223   NA 138 04/11/2016 0506   NA 133 (L) 10/30/2014 0608   K 3.2 (L) 04/12/2016 0223   K 3.3 (L) 04/11/2016 0506   K 3.8 10/30/2014 0608   CL 94 (L) 04/12/2016 0223   CL 99 (L) 04/11/2016 0506   CL 100 10/30/2014 0608   CO2 29 04/12/2016 0223   CO2 27 04/11/2016 0506   CO2 29 10/30/2014 0608   GLUCOSE 68 04/12/2016 0223   GLUCOSE 97 04/11/2016 0506   GLUCOSE 93 10/30/2014 0608   BUN 8 04/12/2016 0223   BUN 9 04/11/2016 0506   BUN 6 10/30/2014  0608   CREATININE 1.33 (H) 04/12/2016 0223   CREATININE 1.24 04/11/2016 0506   CREATININE 0.85 10/30/2014 0608   CALCIUM 8.9 04/12/2016 0223   CALCIUM 9.2 04/11/2016 0506   CALCIUM 9.1 10/30/2014 0608   GFRNONAA >60 04/12/2016 0223   GFRNONAA >60 04/11/2016 0506   GFRNONAA >90 10/30/2014 0608   GFRAA >60 04/12/2016 0223   GFRAA >60 04/11/2016 0506   GFRAA >90 10/30/2014 0608   CMP     Component Value Date/Time   NA 134 (L) 04/12/2016 0223   K 3.2 (L) 04/12/2016 0223   CL 94 (L) 04/12/2016 0223   CO2 29 04/12/2016 0223   GLUCOSE 68 04/12/2016 0223   BUN 8 04/12/2016 0223   CREATININE 1.33 (H) 04/12/2016 0223   CALCIUM 8.9 04/12/2016 0223   PROT 6.3 10/29/2014 0858   ALBUMIN 3.2 (L) 10/29/2014 0858   AST 30 10/29/2014 0858   ALT 27 10/29/2014 0858   ALKPHOS 96 10/29/2014 0858   BILITOT 1.9 (H) 10/29/2014 0858   GFRNONAA >60 04/12/2016 0223   GFRAA >60 04/12/2016 0223       Component Value Date/Time   WBC 12.0 (H) 04/12/2016 0223   WBC 16.3 (H) 04/11/2016 0506   WBC 7.8 10/30/2014 0608   HGB 14.5 04/12/2016 0223   HGB 14.9 04/11/2016 0506   HGB 14.2 10/30/2014 0608   HCT 45.2 04/12/2016 0223   HCT 43.9 04/11/2016 0506   HCT 42.9 10/30/2014 0608   MCV 80.1 04/12/2016 0223   MCV 77.0 (L) 04/11/2016 0506   MCV 88.5 10/30/2014 0608    Lipid Panel     Component Value Date/Time   CHOL 187 10/29/2014 0858   TRIG 102 10/29/2014 0858   HDL 38 (L) 10/29/2014 0858   CHOLHDL 4.9 10/29/2014 0858   VLDL 20 10/29/2014 0858   LDLCALC 129 (H) 10/29/2014 0858    ABG    Component Value Date/Time   PHART 7.478 (H) 09/19/2014 1653   PCO2ART 36.2 09/19/2014 1653   PO2ART 81.4 09/19/2014 1653   HCO3 26.5 (H) 09/19/2014 1653   TCO2 29 10/28/2014 1830   ACIDBASEDEF 3.7 (H) 09/19/2014 1653   O2SAT 96.6 09/19/2014 1653     Lab Results  Component Value Date   TSH 1.252 09/19/2014   BNP (last 3 results)  Recent Labs  04/11/16 0506  BNP 292.3*    ProBNP (last 3  results) No results for input(s): PROBNP in the last 8760 hours.  Cardiac Panel (last 3 results) No results for input(s): CKTOTAL, CKMB, TROPONINI, RELINDX in the last 72 hours.  Iron/TIBC/Ferritin/ %Sat    Component Value  Date/Time   IRON 147 (H) 09/19/2014 1633   TIBC 411 09/19/2014 1633   FERRITIN 112 09/19/2014 1633   IRONPCTSAT 36 09/19/2014 1633     EKG Orders placed or performed during the hospital encounter of 04/11/16  . EKG 12-Lead  . EKG 12-Lead  . EKG     Prior Assessment and Plan Problem List as of 05/05/2016 Reviewed: 04/18/2016  4:39 PM by [name], PA-C     Cardiovascular and Mediastinum   Essential hypertension   Last Assessment & Plan 04/18/2016 Office Visit Written 04/18/2016  4:25 PM by Erlene Quan, PA-C    Controlled      SYNCOPE   CAD (coronary artery disease)   Last Assessment & Plan 04/18/2016 Office Visit Written 04/18/2016  4:24 PM by Erlene Quan, PA-C    50% LAD in 2007      Malignant hypertension   Hemorrhoids   Last Assessment & Plan 07/13/2013 Office Visit Written 07/13/2013  4:10 PM by Mahala Menghini, PA-C    S/p hemorrhoid banding on 06/22/13. Suspect he had thrombosed hemorrhoid given his description. At any rate, he has improved significantly. He has noted a marked improvement in his hemorrhoids after one banding and is interested in second banding over the next few weeks. He will call in couple of weeks and if he is doing well at that time, then consider appointment with Dr. Gala Romney for repeat banding.       Stroke [facility])   Hypertensive emergency   Acute on chronic diastolic heart failure (HCC)   Acute CHF (congestive heart failure) [facility])   Last Assessment & Plan 04/18/2016 Office Visit Written 04/18/2016  4:24 PM by Erlene Quan, PA-C    Pt admitted from home via EMS for acute CHF. He admitted he has been rationing his medications prior to this.       RBBB   Last Assessment & Plan 04/18/2016 Office Visit Written 04/18/2016  4:27 PM by Erlene Quan, PA-C    .      Cardiomyopathy [facility])   Last Assessment & Plan 04/18/2016 Office Visit Written 04/18/2016  4:28 PM by Erlene Quan, PA-C    Etiology not yet determined        Respiratory   Hemoptysis     Digestive   Rectal bleeding   GERD (gastroesophageal reflux disease)   Last Assessment & Plan 07/13/2013 Office Visit Written 07/13/2013  4:08 PM by Mahala Menghini, PA-C    Doing much better since dietary changes, etoh cessation. Dexilant samples provided. If he wants to convert to omeprazole 20mg  daily in the near future, he would likely respond given significant lifestyle changes.       Hepatomegaly   Last Assessment & Plan 11/28/2014 Office Visit Written 11/28/2014  8:59 AM by Baird Cancer, PA-C    AFP today.        Musculoskeletal and Integument   NIGHT SWEATS   Laceration of arm     Other   ANXIETY STATE, UNSPECIFIED   Palpitations   TACHYCARDIA, HX OF   Smoking   Hypokalemia   History of Wolff-Parkinson-White syndrome   Last Assessment & Plan 04/18/2016 Office Visit Written 04/18/2016  4:26 PM by Erlene Quan, PA-C    Prior RFA at [facility]. He did have several minutes of tachycardia at home before admission but no documented arrhythmia in the hospital      Polycythemia   Last Assessment & Plan 11/28/2014 Office Visit  Written 11/28/2014  8:59 AM by Baird Cancer, PA-C    Jak2 negative, BCR/ABL negative, elevated carboxyhemoglobin of 6.9%, WNL epo level, in the setting of tobacco abuse without any identifiable vitamin deficiencies.  S/P three therapeutic phlebotomies on 1/8, 1/13, and 10/03/2014. Labs today: CBC diff, iron/TIBC, ferritin, AFP. Labs in 3 months: CBC diff.  Return in 3 months for follow-up.      Tobacco abuse   Last Assessment & Plan 04/18/2016 Office Visit Written 04/18/2016  4:27 PM by Erlene Quan, PA-C    .      Chronic anxiety   Leukocytosis   Chest tightness   Abdominal pain, chronic, right upper quadrant   Last Assessment & Plan 05/03/2013  Office Visit Edited 05/03/2013  2:41 PM by Mahala Menghini, PA-C    45 year old with 4 month history of epigastric/right upper quadrant abdominal pain, initially associated with nausea/vomiting, "clay-colored stools", loose stool. He has had CT of the abdomen pelvis, abdominal ultrasound, LFTs which were all normal. Tried over-the-counter antacids without relief. He notes increased pain related to certain foods. Denies typical heartburn. Also notably on exam he has pain over the rib cage which is likely musculoskeletal.  Other GI issues included change in bowel habits, bright red blood per thumb at times, family history of colon cancer at a young age. Overdue for colonoscopy at this time.  At this point, would recommend EGD and colonoscopy for further evaluation of above-stated symptoms and for colon cancer screening. Patient requires chronic narcotics therefore we'll offer deep sedation in the OR. I have discussed the risks, alternatives, benefits with regards to but not limited to the risk of reaction to medication, bleeding, infection, perforation and the patient is agreeable to proceed. Written consent to be obtained.  Start Dexilant 60mg  daily.   If abdominal pain remains unexplained after procedures, consider HIDA scan to complete GI work-up.       Bowel habit changes   FH: colon cancer   Left sided numbness   Orthopnea   Elevated troponin I level   Elevated brain natriuretic peptide (BNP) level   Interstitial edema   ETOH abuse   Last Assessment & Plan 04/18/2016 Office Visit Written 04/18/2016  4:27 PM by Erlene Quan, PA-C    "2 beers a day"      Family history of coronary artery disease in father   Last Assessment & Plan 04/18/2016 Office Visit Written 04/18/2016  4:29 PM by Erlene Quan, PA-C    F had an MI in his 68's      Dyslipidemia   Last Assessment & Plan 04/18/2016 Office Visit Written 04/18/2016  4:30 PM by Erlene Quan, PA-C    Last LDL 126- he couldn't afford statin Rx           Imaging: Dg Chest 2 View  Result Date: 04/11/2016 CLINICAL DATA:  45 year old male with hemoptysis. Shortness of breath. EXAM: CHEST  2 VIEW COMPARISON:  Chest radiograph dated 03/08/2013 FINDINGS: Two views of the chest demonstrate increased interstitial prominence with Kerley B-lines compatible with interstitial edema. Right lung base hazy density, likely atelectatic changes. Pneumonia is less likely. Clinical correlation is recommended. There is no focal consolidation, pleural effusion, or pneumothorax. A 1 cm nodular appearing density in the right lower lung field most likely represent the nipple shadow. The cardiac silhouette is within normal limits. No acute osseous pathology. IMPRESSION: Mild interstitial edema.  No focal consolidation. Electronically Signed   By: Laren Everts.D.  On: 04/11/2016 06:10

## 2016-05-05 NOTE — Patient Instructions (Signed)
Your physician recommends that you schedule a follow-up appointment after Cath   Your physician recommends that you continue on your current medications as directed. Please refer to the Current Medication list given to you today.  Your physician has requested that you have a cardiac catheterization. Cardiac catheterization is used to diagnose and/or treat various heart conditions. Doctors may recommend this procedure for a number of different reasons. The most common reason is to evaluate chest pain. Chest pain can be a symptom of coronary artery disease (CAD), and cardiac catheterization can show whether plaque is narrowing or blocking your heart's arteries. This procedure is also used to evaluate the valves, as well as measure the blood flow and oxygen levels in different parts of your heart. For further information please visit www.cardiosmart.org. Please follow instruction sheet, as given.   If you need a refill on your cardiac medications before your next appointment, please call your pharmacy.  Thank you for choosing [facility]!   

## 2016-05-07 ENCOUNTER — Other Ambulatory Visit (HOSPITAL_COMMUNITY)
Admission: RE | Admit: 2016-05-07 | Discharge: 2016-05-07 | Disposition: A | Discharge status: Home / Self Care | Payer: Medicare Other | Source: Ambulatory Visit | Attending: Adult Health | Admitting: Adult Health

## 2016-05-07 DIAGNOSIS — I5041 Acute combined systolic (congestive) and diastolic (congestive) heart failure: Secondary | ICD-10-CM | POA: Diagnosis not present

## 2016-05-07 LAB — BASIC METABOLIC PANEL
Anion gap: 9 (ref 5–15)
BUN: 17 mg/dL (ref 6–20)
CALCIUM: 9.6 mg/dL (ref 8.9–10.3)
CO2: 31 mmol/L (ref 22–32)
CREATININE: 1.28 mg/dL — AB (ref 0.61–1.24)
Chloride: 93 mmol/L — ABNORMAL LOW (ref 101–111)
Glucose, Bld: 99 mg/dL (ref 65–99)
Potassium: 2.9 mmol/L — ABNORMAL LOW (ref 3.5–5.1)
SODIUM: 133 mmol/L — AB (ref 135–145)

## 2016-05-07 LAB — CBC WITH DIFFERENTIAL/PLATELET
Basophils Absolute: 0.1 10*3/uL (ref 0.0–0.1)
Basophils Relative: 1 %
EOS ABS: 0.4 10*3/uL (ref 0.0–0.7)
EOS PCT: 3 %
HCT: 49.1 % (ref 39.0–52.0)
Hemoglobin: 16.8 g/dL (ref 13.0–17.0)
Lymphocytes Relative: 32 %
Lymphs Abs: 4.3 10*3/uL — ABNORMAL HIGH (ref 0.7–4.0)
MCH: 26.2 pg (ref 26.0–34.0)
MCHC: 34.2 g/dL (ref 30.0–36.0)
MCV: 76.6 fL — ABNORMAL LOW (ref 78.0–100.0)
MONO ABS: 0.9 10*3/uL (ref 0.1–1.0)
Monocytes Relative: 7 %
NEUTROS PCT: 57 %
Neutro Abs: 7.7 10*3/uL (ref 1.7–7.7)
Platelets: 377 10*3/uL (ref 150–400)
RBC: 6.41 MIL/uL — AB (ref 4.22–5.81)
RDW: 16.8 % — AB (ref 11.5–15.5)
WBC: 13.4 10*3/uL — AB (ref 4.0–10.5)

## 2016-05-07 LAB — PROTIME-INR
INR: 0.91
PROTHROMBIN TIME: 12.2 s (ref 11.4–15.2)

## 2016-05-08 ENCOUNTER — Other Ambulatory Visit: Payer: Self-pay

## 2016-05-08 ENCOUNTER — Ambulatory Visit (HOSPITAL_COMMUNITY)
Admission: RE | Disposition: A | Discharge status: Home / Self Care | Payer: Self-pay | Source: Ambulatory Visit | Attending: Cardiology

## 2016-05-08 ENCOUNTER — Encounter (HOSPITAL_COMMUNITY): Payer: Self-pay | Admitting: Cardiology

## 2016-05-08 ENCOUNTER — Ambulatory Visit (HOSPITAL_COMMUNITY)
Admission: RE | Admit: 2016-05-08 | Discharge: 2016-05-08 | Disposition: A | Discharge status: Home / Self Care | Payer: Medicare Other | Source: Ambulatory Visit | Attending: Cardiology | Admitting: Cardiology

## 2016-05-08 DIAGNOSIS — F419 Anxiety disorder, unspecified: Secondary | ICD-10-CM | POA: Diagnosis not present

## 2016-05-08 DIAGNOSIS — Z9119 Patient's noncompliance with other medical treatment and regimen: Secondary | ICD-10-CM | POA: Diagnosis not present

## 2016-05-08 DIAGNOSIS — M1990 Unspecified osteoarthritis, unspecified site: Secondary | ICD-10-CM | POA: Diagnosis not present

## 2016-05-08 DIAGNOSIS — I110 Hypertensive heart disease with heart failure: Secondary | ICD-10-CM | POA: Insufficient documentation

## 2016-05-08 DIAGNOSIS — G4733 Obstructive sleep apnea (adult) (pediatric): Secondary | ICD-10-CM | POA: Diagnosis not present

## 2016-05-08 DIAGNOSIS — I4510 Unspecified right bundle-branch block: Secondary | ICD-10-CM | POA: Diagnosis not present

## 2016-05-08 DIAGNOSIS — I456 Pre-excitation syndrome: Secondary | ICD-10-CM | POA: Insufficient documentation

## 2016-05-08 DIAGNOSIS — I272 Other secondary pulmonary hypertension: Secondary | ICD-10-CM | POA: Diagnosis not present

## 2016-05-08 DIAGNOSIS — G8929 Other chronic pain: Secondary | ICD-10-CM | POA: Diagnosis not present

## 2016-05-08 DIAGNOSIS — I2510 Atherosclerotic heart disease of native coronary artery without angina pectoris: Secondary | ICD-10-CM | POA: Diagnosis present

## 2016-05-08 DIAGNOSIS — K219 Gastro-esophageal reflux disease without esophagitis: Secondary | ICD-10-CM | POA: Diagnosis not present

## 2016-05-08 DIAGNOSIS — Z8679 Personal history of other diseases of the circulatory system: Secondary | ICD-10-CM

## 2016-05-08 DIAGNOSIS — Z800 Family history of malignant neoplasm of digestive organs: Secondary | ICD-10-CM | POA: Diagnosis not present

## 2016-05-08 DIAGNOSIS — Z803 Family history of malignant neoplasm of breast: Secondary | ICD-10-CM | POA: Insufficient documentation

## 2016-05-08 DIAGNOSIS — Z885 Allergy status to narcotic agent status: Secondary | ICD-10-CM | POA: Diagnosis not present

## 2016-05-08 DIAGNOSIS — Z7982 Long term (current) use of aspirin: Secondary | ICD-10-CM | POA: Insufficient documentation

## 2016-05-08 DIAGNOSIS — Z8249 Family history of ischemic heart disease and other diseases of the circulatory system: Secondary | ICD-10-CM | POA: Insufficient documentation

## 2016-05-08 DIAGNOSIS — I10 Essential (primary) hypertension: Secondary | ICD-10-CM | POA: Diagnosis present

## 2016-05-08 DIAGNOSIS — F17210 Nicotine dependence, cigarettes, uncomplicated: Secondary | ICD-10-CM | POA: Diagnosis not present

## 2016-05-08 DIAGNOSIS — I5042 Chronic combined systolic (congestive) and diastolic (congestive) heart failure: Secondary | ICD-10-CM | POA: Diagnosis not present

## 2016-05-08 HISTORY — PX: CARDIAC CATHETERIZATION: SHX172

## 2016-05-08 LAB — POCT I-STAT 3, ART BLOOD GAS (G3+)
Acid-Base Excess: 3 mmol/L — ABNORMAL HIGH (ref 0.0–2.0)
Bicarbonate: 29.3 mEq/L — ABNORMAL HIGH (ref 20.0–24.0)
O2 Saturation: 95 %
PCO2 ART: 48.6 mmHg — AB (ref 35.0–45.0)
PH ART: 7.389 (ref 7.350–7.450)
TCO2: 31 mmol/L (ref 0–100)
pO2, Arterial: 79 mmHg — ABNORMAL LOW (ref 80.0–100.0)

## 2016-05-08 LAB — POCT I-STAT 3, VENOUS BLOOD GAS (G3P V)
ACID-BASE EXCESS: 4 mmol/L — AB (ref 0.0–2.0)
Bicarbonate: 29.4 mEq/L — ABNORMAL HIGH (ref 20.0–24.0)
O2 SAT: 69 %
PH VEN: 7.403 — AB (ref 7.250–7.300)
PO2 VEN: 36 mmHg (ref 31.0–45.0)
TCO2: 31 mmol/L (ref 0–100)
pCO2, Ven: 47.2 mmHg (ref 45.0–50.0)

## 2016-05-08 LAB — BASIC METABOLIC PANEL
ANION GAP: 8 (ref 5–15)
BUN: 17 mg/dL (ref 6–20)
CHLORIDE: 95 mmol/L — AB (ref 101–111)
CO2: 30 mmol/L (ref 22–32)
Calcium: 9.7 mg/dL (ref 8.9–10.3)
Creatinine, Ser: 1.23 mg/dL (ref 0.61–1.24)
GFR calc Af Amer: >60 mL/min (ref >60)
GLUCOSE: 94 mg/dL (ref 65–99)
POTASSIUM: 3.1 mmol/L — AB (ref 3.5–5.1)
Sodium: 133 mmol/L — ABNORMAL LOW (ref 135–145)

## 2016-05-08 SURGERY — RIGHT/LEFT HEART CATH AND CORONARY ANGIOGRAPHY
Anesthesia: LOCAL

## 2016-05-08 MED ORDER — OXYCODONE-ACETAMINOPHEN 5-325 MG PO TABS
2.0000 | ORAL_TABLET | Freq: Once | ORAL | Status: AC
  Administered 2016-05-08: 2 via ORAL

## 2016-05-08 MED ORDER — POTASSIUM CHLORIDE CRYS ER 20 MEQ PO TBCR
20.0000 meq | EXTENDED_RELEASE_TABLET | Freq: Once | ORAL | Status: AC
  Administered 2016-05-08: 20 meq via ORAL

## 2016-05-08 MED ORDER — POTASSIUM CHLORIDE CRYS ER 20 MEQ PO TBCR
EXTENDED_RELEASE_TABLET | ORAL | Status: AC
  Filled 2016-05-08: qty 1

## 2016-05-08 MED ORDER — SODIUM CHLORIDE 0.9 % WEIGHT BASED INFUSION: 1.0000 mL/kg/h | INTRAVENOUS | Status: DC

## 2016-05-08 MED ORDER — MIDAZOLAM HCL 2 MG/2ML IJ SOLN
INTRAMUSCULAR | Status: AC
  Filled 2016-05-08: qty 2

## 2016-05-08 MED ORDER — HEPARIN (PORCINE) IN NACL 2-0.9 UNIT/ML-% IJ SOLN
INTRAMUSCULAR | Status: AC
  Filled 2016-05-08: qty 1000

## 2016-05-08 MED ORDER — FENTANYL CITRATE (PF) 100 MCG/2ML IJ SOLN
INTRAMUSCULAR | Status: DC | PRN
  Administered 2016-05-08 (×3): 25 ug via INTRAVENOUS

## 2016-05-08 MED ORDER — SODIUM CHLORIDE 0.9% FLUSH: 3.0000 mL | Freq: Two times a day (BID) | INTRAVENOUS | Status: DC

## 2016-05-08 MED ORDER — VERAPAMIL HCL 2.5 MG/ML IV SOLN
INTRAVENOUS | Status: AC
  Filled 2016-05-08: qty 2

## 2016-05-08 MED ORDER — IOPAMIDOL (ISOVUE-370) INJECTION 76%
INTRAVENOUS | Status: AC
  Filled 2016-05-08: qty 100

## 2016-05-08 MED ORDER — SODIUM CHLORIDE 0.9% FLUSH: 3.0000 mL | INTRAVENOUS | Status: DC | PRN

## 2016-05-08 MED ORDER — SODIUM CHLORIDE 0.9 % WEIGHT BASED INFUSION: 1.0000 mL/kg/h | INTRAVENOUS | Status: AC

## 2016-05-08 MED ORDER — OXYCODONE-ACETAMINOPHEN 5-325 MG PO TABS
ORAL_TABLET | ORAL | Status: AC
  Filled 2016-05-08: qty 2

## 2016-05-08 MED ORDER — FENTANYL CITRATE (PF) 100 MCG/2ML IJ SOLN
INTRAMUSCULAR | Status: AC
  Filled 2016-05-08: qty 2

## 2016-05-08 MED ORDER — MIDAZOLAM HCL 2 MG/2ML IJ SOLN
INTRAMUSCULAR | Status: DC | PRN
Start: 2016-05-08 — End: 2016-05-08
  Administered 2016-05-08 (×3): 1 mg via INTRAVENOUS

## 2016-05-08 MED ORDER — HEPARIN SODIUM (PORCINE) 1000 UNIT/ML IJ SOLN
INTRAMUSCULAR | Status: AC
  Filled 2016-05-08: qty 1

## 2016-05-08 MED ORDER — SODIUM CHLORIDE 0.9 % WEIGHT BASED INFUSION
3.0000 mL/kg/h | INTRAVENOUS | Status: DC
  Administered 2016-05-08: 3 mL/kg/h via INTRAVENOUS

## 2016-05-08 MED ORDER — LIDOCAINE HCL (PF) 1 % IJ SOLN
INTRAMUSCULAR | Status: AC
  Filled 2016-05-08: qty 30

## 2016-05-08 MED ORDER — SODIUM CHLORIDE 0.9 % IV SOLN: 250.0000 mL | INTRAVENOUS | Status: DC | PRN

## 2016-05-08 MED ORDER — LIDOCAINE HCL (PF) 1 % IJ SOLN
INTRAMUSCULAR | Status: DC | PRN
  Administered 2016-05-08: 20 mL via SUBCUTANEOUS
  Administered 2016-05-08 (×2): 2 mL via SUBCUTANEOUS

## 2016-05-08 MED ORDER — IOPAMIDOL (ISOVUE-370) INJECTION 76%
INTRAVENOUS | Status: DC | PRN
  Administered 2016-05-08: 70 mL

## 2016-05-08 MED ORDER — VERAPAMIL HCL 2.5 MG/ML IV SOLN
INTRAVENOUS | Status: DC | PRN
  Administered 2016-05-08 (×2): via INTRA_ARTERIAL

## 2016-05-08 MED ORDER — ASPIRIN 81 MG PO CHEW: 81.0000 mg | CHEWABLE_TABLET | ORAL | Status: DC

## 2016-05-08 MED ORDER — HEPARIN (PORCINE) IN NACL 2-0.9 UNIT/ML-% IJ SOLN
INTRAMUSCULAR | Status: DC | PRN
  Administered 2016-05-08: 1000 mL

## 2016-05-08 SURGICAL SUPPLY — 18 items
CATH BALLN WEDGE 5F 110CM (CATHETERS) ×1 IMPLANT
CATH INFINITI 5FR ANG PIGTAIL (CATHETERS) ×1 IMPLANT
CATH INFINITI 5FR JL4 (CATHETERS) ×1 IMPLANT
CATH INFINITI JR4 5F (CATHETERS) ×1 IMPLANT
CATH SITESEER 5F NTR (CATHETERS) ×1 IMPLANT
DEVICE RAD COMP TR BAND LRG (VASCULAR PRODUCTS) ×1 IMPLANT
GLIDESHEATH SLEND SS 6F .021 (SHEATH) ×1 IMPLANT
GUIDEWIRE .025 260CM (WIRE) ×1 IMPLANT
KIT HEART LEFT (KITS) ×2 IMPLANT
KIT HEART RIGHT NAMIC (KITS) ×2 IMPLANT
PACK CARDIAC CATHETERIZATION (CUSTOM PROCEDURE TRAY) ×2 IMPLANT
SHEATH FAST CATH BRACH 5F 5CM (SHEATH) ×1 IMPLANT
SHEATH PINNACLE 5F 10CM (SHEATH) ×1 IMPLANT
SYR MEDRAD MARK V 150ML (SYRINGE) ×2 IMPLANT
TRANSDUCER W/STOPCOCK (MISCELLANEOUS) ×4 IMPLANT
TUBING CIL FLEX 10 FLL-RA (TUBING) ×2 IMPLANT
WIRE EMERALD 3MM-J .035X150CM (WIRE) ×1 IMPLANT
WIRE SAFE-T 1.5MM-J .035X260CM (WIRE) ×1 IMPLANT

## 2016-05-08 NOTE — Progress Notes (Signed)
Site area: rt groin Site Prior to Removal:  Level 0 Pressure Applied For:  20 minutes Manual:   yes Patient Status During Pull:  stable Post Pull Site:  Level  0 Post Pull Instructions Given:  yes Post Pull Pulses Present: yes Dressing Applied:  tegaderm Bedrest begins @  0930 Comments:

## 2016-05-08 NOTE — Discharge Instructions (Signed)
Radial Site Care Refer to this sheet in the next few weeks. These instructions provide you with information about caring for yourself after your procedure. Your health care provider may also give you more specific instructions. Your treatment has been planned according to current medical practices, but problems sometimes occur. Call your health care provider if you have any problems or questions after your procedure. WHAT TO EXPECT AFTER THE PROCEDURE After your procedure, it is typical to have the following:  Bruising at the radial site that usually fades within 1-2 weeks.  Blood collecting in the tissue (hematoma) that may be painful to the touch. It should usually decrease in size and tenderness within 1-2 weeks. HOME CARE INSTRUCTIONS  Take medicines only as directed by your health care provider.  You may shower 24-48 hours after the procedure or as directed by your health care provider. Remove the bandage (dressing) and gently wash the site with plain soap and water. Pat the area dry with a clean towel. Do not rub the site, because this may cause bleeding.  Do not take baths, swim, or use a hot tub until your health care provider approves.  Check your insertion site every day for redness, swelling, or drainage.  Do not apply powder or lotion to the site.  Do not flex or bend the affected arm for 24 hours or as directed by your health care provider.  Do not push or pull heavy objects with the affected arm for 24 hours or as directed by your health care provider.  Do not lift over 10 lb (4.5 kg) for 5 days after your procedure or as directed by your health care provider.  Ask your health care provider when it is okay to:  Return to work or school.  Resume usual physical activities or sports.  Resume sexual activity.  Do not drive home if you are discharged the same day as the procedure. Have someone else drive you.  You may drive 24 hours after the procedure unless otherwise  instructed by your health care provider.  Do not operate machinery or power tools for 24 hours after the procedure.  If your procedure was done as an outpatient procedure, which means that you went home the same day as your procedure, a responsible adult should be with you for the first 24 hours after you arrive home.  Keep all follow-up visits as directed by your health care provider. This is important. SEEK MEDICAL CARE IF:  You have a fever.  You have chills.  You have increased bleeding from the radial site. Hold pressure on the site. CALL 911 SEEK IMMEDIATE MEDICAL CARE IF:  You have unusual pain at the radial site.  You have redness, warmth, or swelling at the radial site.  You have drainage (other than a small amount of blood on the dressing) from the radial site.  The radial site is bleeding, and the bleeding does not stop after 30 minutes of holding steady pressure on the site.  Your arm or hand becomes pale, cool, tingly, or numb.   This information is not intended to replace advice given to you by your health care provider. Make sure you discuss any questions you have with your health care provider.   Document Released: 10/04/2010 Document Revised: 09/22/2014 Document Reviewed: 03/20/2014 Elsevier Interactive Patient Education 2016 [employer] After Refer to this sheet in the next few weeks. These instructions provide you with information about caring for yourself after your procedure. Your health  care provider may also give you more specific instructions. Your treatment has been planned according to current medical practices, but problems sometimes occur. Call your health care provider if you have any problems or questions after your procedure. WHAT TO EXPECT AFTER THE PROCEDURE After your procedure, it is typical to have the following:  Bruising at the catheter insertion site that usually fades within 1-2 weeks.  Blood collecting in the tissue  (hematoma) that may be painful to the touch. It should usually decrease in size and tenderness within 1-2 weeks. HOME CARE INSTRUCTIONS  Take medicines only as directed by your health care provider.  You may shower 24-48 hours after the procedure or as directed by your health care provider. Remove the bandage (dressing) and gently wash the site with plain soap and water. Pat the area dry with a clean towel. Do not rub the site, because this may cause bleeding.  Do not take baths, swim, or use a hot tub until your health care provider approves.  Check your insertion site every day for redness, swelling, or drainage.  Do not apply powder or lotion to the site.  Do not lift over 10 lb (4.5 kg) for 5 days after your procedure or as directed by your health care provider.  Ask your health care provider when it is okay to:  Return to work or school.  Resume usual physical activities or sports.  Resume sexual activity.  Do not drive home if you are discharged the same day as the procedure. Have someone else drive you.  You may drive 24 hours after the procedure unless otherwise instructed by your health care provider.  Do not operate machinery or power tools for 24 hours after the procedure or as directed by your health care provider.  If your procedure was done as an outpatient procedure, which means that you went home the same day as your procedure, a responsible adult should be with you for the first 24 hours after you arrive home.  Keep all follow-up visits as directed by your health care provider. This is important. SEEK MEDICAL CARE IF:  You have a fever.  You have chills.  You have increased bleeding from the catheter insertion site. Hold pressure on the site. CALL 911 SEEK IMMEDIATE MEDICAL CARE IF:  You have unusual pain at the catheter insertion site.  You have redness, warmth, or swelling at the catheter insertion site.  You have drainage (other than a small amount of  blood on the dressing) from the catheter insertion site.  The catheter insertion site is bleeding, and the bleeding does not stop after 30 minutes of holding steady pressure on the site.  The area near or just beyond the catheter insertion site becomes pale, cool, tingly, or numb.   This information is not intended to replace advice given to you by your health care provider. Make sure you discuss any questions you have with your health care provider.   Document Released: 03/20/2005 Document Revised: 09/22/2014 Document Reviewed: 02/02/2013 Elsevier Interactive Patient Education [employer].

## 2016-05-08 NOTE — H&P (View-Only) (Signed)
Cardiology Office Note   Date:  05/05/2016   ID:  Timothy Westby., DOB 03-Jul-1971, MRN [id number]  PCP:  Glo Herring., MD  Cardiologist: Cloria Spring, NP   No chief complaint on file.     History of Present Illness: Timothy [place]. is a 45 y.o. male who presents for ongoing assessment and management of hypertension, CHF, Wolff-Parkinson-White-White syndrome (status post ablation x3 at [facility]. Other history includes obstructive sleep apnea with noncompliance of CPAP.  He was last sin the office by [name], PA, after ER visit for acute systolic CHF, with EF during hospitalization revealing LV of 35-40% with diffuse hypokinesis and grade 2 diastolic dysfunction. During the office visit he admitted that he was rationing his medications secondary to cost, only taking half of his clonidine tablets, not taking Lipitor, and only half of his metoprolol dose. He was to be scheduled for a right and left heart catheterization.  He has not been set up for RHC/LHC at this time. He is continuing to feel weak when he takes his medications as directed and therefore continues to take lower doses at home to save money.He has stopped taking clonidine altogether. He continues to smoke but is working on quitting and is actively trying to cut down. He is very anxious and emotional about his current health status.    Past Medical History:  Diagnosis Date  . Anxiety   . Arthritis   . CAD (coronary artery disease) 2007   50% stenosis small diagonal  . Central sleep apnea   . Chronic pain    since 1998 has been on chronic pain medication  . GERD (gastroesophageal reflux disease)   . Hemorrhoids   . Hepatomegaly 10/03/2014  . HTN (hypertension)   . Palpitations   . Syncope   . Tachycardia   . Tobacco abuse   . Tubular adenoma   . Wolff-Parkinson-White (WPW) syndrome    says was cured with ablation    Past Surgical History:  Procedure Laterality Date  .  ADENOIDECTOMY    . APPENDECTOMY    . ATRIAL ABLATION SURGERY    . AV NODE ABLATION    . BIOPSY N/A 05/12/2013   Procedure: BIOPSY;  Surgeon: Daneil Dolin, MD;  Location: [facility];  Service: Endoscopy;  Laterality: N/A;  . CARDIAC ELECTROPHYSIOLOGY STUDY AND ABLATION     SA node ablation  . COLONOSCOPY     age 82  . COLONOSCOPY WITH PROPOFOL N/A 05/12/2013   Dr. Gala Romney- hemorrhoids, tubular adenoma  . ESOPHAGOGASTRODUODENOSCOPY (EGD) WITH PROPOFOL N/A 05/12/2013   Dr. Gala Romney- hiatal hernia, erosive reflux esophagitis  . HAND SURGERY    . HEMORRHOID BANDING  06/22/13  . POLYPECTOMY N/A 05/12/2013   Procedure: POLYPECTOMY;  Surgeon: Daneil Dolin, MD;  Location: [facility];  Service: Endoscopy;  Laterality: N/A;  . SHOULDER SURGERY       Current Outpatient Prescriptions  Medication Sig Dispense Refill  . aspirin 325 MG tablet Take 1 tablet (325 mg total) by mouth daily. 30 tablet 11  . atorvastatin (LIPITOR) 10 MG tablet Take 1 tablet (10 mg total) by mouth at bedtime. 30 tablet 11  . carvedilol (COREG) 12.5 MG tablet Take 1 tablet (12.5 mg total) by mouth 2 (two) times daily. (Patient taking differently: Take 12.5 mg by mouth daily. ) 60 tablet 11  . furosemide (LASIX) 20 MG tablet Take 20 mg by mouth daily.    . nicotine (NICODERM CQ -  DOSED IN MG/24 HOURS) 21 mg/24hr patch Place 1 patch (21 mg total) onto the skin daily. 28 patch 0  . Oxycodone HCl 20 MG TABS Take 20 mg by mouth every 6 (six) hours as needed (Pain).   0  . potassium chloride SA (K-DUR,KLOR-CON) 20 MEQ tablet Take 1 tablet (20 mEq total) by mouth daily. x3days 3 tablet 0  . ramipril (ALTACE) 10 MG capsule Take 1 capsule (10 mg total) by mouth daily. 30 capsule 11  . ranitidine (ZANTAC) 150 MG tablet Take 150 mg by mouth as needed. OTC medication     No current facility-administered medications for this visit.     Allergies:   Morphine    Social History:  The patient  reports that he has been smoking Cigarettes.  He has a  12.50 pack-year smoking history. He has never used smokeless tobacco. He reports that he drinks about 1.2 oz of alcohol per week . He reports that he does not use drugs.   Family History:  The patient's family history includes Breast cancer in his mother; Colon cancer in his father; Coronary artery disease in his father; Hypertension in his mother.    ROS: All other systems are reviewed and negative. Unless otherwise mentioned in H&P    PHYSICAL EXAM: VS:  BP 130/76   Pulse 93   Ht 6' (1.829 m)   Wt 208 lb (94.3 kg)   SpO2 97%   BMI 28.21 kg/m  , BMI Body mass index is 28.21 kg/m. GEN: Well nourished, well developed, in no acute distress  HEENT: normal  Neck: no JVD, carotid bruits, or masses Cardiac: RRR; distant heart sounds, no murmurs, rubs, or gallops,no edema  Respiratory:  clear to auscultation bilaterally, normal work of breathing GI: soft, nontender, nondistended, + BS MS: no deformity or atrophy  Skin: warm and dry, no rash Neuro:  Strength and sensation are intact Psych: euthymic mood, full affect   EKG 04/11/2016: RBBB, rate of 90 bpm.   Recent Labs: 04/11/2016: B Natriuretic Peptide 292.3 04/12/2016: BUN 8; Creatinine, Ser 1.33; Hemoglobin 14.5; Platelets 371; Potassium 3.2; Sodium 134    Lipid Panel    Component Value Date/Time   CHOL 187 10/29/2014 0858   TRIG 102 10/29/2014 0858   HDL 38 (L) 10/29/2014 0858   CHOLHDL 4.9 10/29/2014 0858   VLDL 20 10/29/2014 0858   LDLCALC 129 (H) 10/29/2014 0858      Wt Readings from Last 3 Encounters:  05/05/16 208 lb (94.3 kg)  04/18/16 206 lb (93.4 kg)  04/12/16 209 lb 14.4 oz (95.2 kg)      Other studies Reviewed: Additional studies/ records that were reviewed today include: Ehcocardiogram Review of the above records demonstrates:  04/12/2016  Left ventricle: The cavity size was normal. Wall thickness was   normal. Systolic function was moderately reduced. The estimated   ejection fraction was in the range  of 35% to 40%. Moderate   diffuse hypokinesis with no identifiable regional variations.   Features are consistent with a pseudonormal left ventricular   filling pattern, with concomitant abnormal relaxation and   increased filling pressure (grade 2 diastolic dysfunction). - Mitral valve: There was mild regurgitation. - Left atrium: The atrium was moderately dilated. - Right atrium: The atrium was mildly dilated.   ASSESSMENT AND PLAN:  1. 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 He was not initially sent for RHC/LHC in this setting  due to hypertension with need for medical management and confirmation of compliance.   He continues to struggle with paying of medications, and will be referred to Denyse Amass for medication assistance. He will also be referred to [facility] Patient Assistance for help with billing for his procedures and hospitalization. 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 I have asked him not to lift or carry any objects over 10 lbs.   He is willing to proceed with cardiac cath for diagnostic prognostic purposes. He states that he has never used illicit drugs.   2. Hypertension: BP is not optimal for current LV fx. I have advised him to take coreg as directed along with his other medications. Will not restart clonidine at this time. I have advised him that he may feel worse before he feels better when resuming the medications as dose directed regimen. He verbalizes understanding.   3. CAD: Most recent cardiac cath in 2007 with disease of small diagonal per history. Will await cath results Continue statin and ASA.    Current medicines are reviewed at length with the patient today.    Labs/ tests ordered today include: RHC/LHC  No orders of the defined types were placed in this encounter.    Disposition:   FU with cardiology post cardiac cath.  Signed, Jory Sims, NP   05/05/2016 4:28 PM    Timothy [address] [place], [place] [address] Phone: [phone]; Fax: [phone]

## 2016-05-08 NOTE — Progress Notes (Signed)
Site area: Environmental health practitioner Prior to Removal:  Level 0 Pressure Applied For:  15 minutes Manual:   yes Patient Status During Pull:  stable Post Pull Site:  Level  0 Post Pull Instructions Given:  yes Post Pull Pulses Present: yes Dressing Applied:  Small tegaderm Bedrest begins @  Comments:

## 2016-05-08 NOTE — Interval H&P Note (Signed)
History and Physical Interval Note:  05/08/2016 7:29 AM  Timothy [name].  has presented today for surgery, with the diagnosis of pulm htn   decreased systolic function  The various methods of treatment have been discussed with the patient and family. After consideration of risks, benefits and other options for treatment, the patient has consented to  Procedure(s): Right/Left Heart Cath and Coronary Angiography (N/A) as a surgical intervention .  The patient's history has been reviewed, patient examined, no change in status, stable for surgery.  I have reviewed the patient's chart and labs.  Questions were answered to the patient's satisfaction.    Cath Lab Visit (complete for each Cath Lab visit)  Clinical Evaluation Leading to the Procedure:   ACS: No.  Non-ACS:    Anginal Classification: CCS III  Anti-ischemic medical therapy: Minimal Therapy (1 class of medications)  Non-Invasive Test Results: No non-invasive testing performed  Prior CABG: No previous CABG       Timothy [name] [facility] 05/08/2016 7:30 AM

## 2016-05-12 MED FILL — Verapamil HCl IV Soln 2.5 MG/ML: INTRAVENOUS | Qty: 2 | Status: AC

## 2016-05-12 MED FILL — Heparin Sodium (Porcine) Inj 1000 Unit/ML: INTRAMUSCULAR | Qty: 10 | Status: AC

## 2016-05-16 DIAGNOSIS — E782 Mixed hyperlipidemia: Secondary | ICD-10-CM | POA: Diagnosis not present

## 2016-05-16 DIAGNOSIS — E876 Hypokalemia: Secondary | ICD-10-CM | POA: Diagnosis not present

## 2016-05-16 DIAGNOSIS — E663 Overweight: Secondary | ICD-10-CM | POA: Diagnosis not present

## 2016-05-16 DIAGNOSIS — Z6829 Body mass index (BMI) 29.0-29.9, adult: Secondary | ICD-10-CM | POA: Diagnosis not present

## 2016-05-16 DIAGNOSIS — I5031 Acute diastolic (congestive) heart failure: Secondary | ICD-10-CM | POA: Diagnosis not present

## 2016-05-16 DIAGNOSIS — R400 Somnolence: Secondary | ICD-10-CM | POA: Diagnosis not present

## 2016-05-16 DIAGNOSIS — Z1389 Encounter for screening for other disorder: Secondary | ICD-10-CM | POA: Diagnosis not present

## 2016-05-16 DIAGNOSIS — R0681 Apnea, not elsewhere classified: Secondary | ICD-10-CM | POA: Diagnosis not present

## 2016-05-16 DIAGNOSIS — I420 Dilated cardiomyopathy: Secondary | ICD-10-CM | POA: Diagnosis not present

## 2016-05-16 DIAGNOSIS — R5383 Other fatigue: Secondary | ICD-10-CM | POA: Diagnosis not present

## 2016-05-23 ENCOUNTER — Ambulatory Visit (INDEPENDENT_AMBULATORY_CARE_PROVIDER_SITE_OTHER): Payer: Medicare Other | Admitting: Adult Health

## 2016-05-23 ENCOUNTER — Encounter: Payer: Self-pay | Admitting: Adult Health

## 2016-05-23 VITALS — BP 118/80 | HR 69 | Ht 72.0 in | Wt 211.0 lb

## 2016-05-23 DIAGNOSIS — I10 Essential (primary) hypertension: Secondary | ICD-10-CM | POA: Diagnosis not present

## 2016-05-23 DIAGNOSIS — I2510 Atherosclerotic heart disease of native coronary artery without angina pectoris: Secondary | ICD-10-CM

## 2016-05-23 DIAGNOSIS — I429 Cardiomyopathy, unspecified: Secondary | ICD-10-CM | POA: Diagnosis not present

## 2016-05-23 DIAGNOSIS — Z720 Tobacco use: Secondary | ICD-10-CM | POA: Diagnosis not present

## 2016-05-23 MED ORDER — CARVEDILOL 6.25 MG PO TABS: 9.7360 mg | ORAL_TABLET | Freq: Two times a day (BID) | ORAL | 6 refills | Status: DC

## 2016-05-23 NOTE — Patient Instructions (Signed)
Your physician recommends that you schedule a follow-up appointment in: November  Your physician has recommended you make the following change in your medication: Decrease Coreg to 9.735 mg (1 1/2 Tablets) Two Times Daily.  Your physician has requested that you have an echocardiogram. Echocardiography is a painless test that uses sound waves to create images of your heart. It provides your doctor with information about the size and shape of your heart and how well your heart's chambers and valves are working. This procedure takes approximately one hour. There are no restrictions for this procedure.  If you need a refill on your cardiac medications before your next appointment, please call your pharmacy.  Thank you for choosing [facility]!

## 2016-05-23 NOTE — Progress Notes (Signed)
Name: Timothy [name].    DOB: 14-Jan-1971  Age: 45 y.o.  MR#: [id number]       PCP:  Glo Herring., MD      Insurance: Payor: MEDICARE / Plan: MEDICARE PART A AND B / Product Type: *No Product type* /   CC:   No chief complaint on file.   VS Vitals:   05/23/16 1340  BP: 118/80  Pulse: 69  SpO2: 97%  Weight: 211 lb (95.7 kg)  Height: 6' (1.829 m)    Weights Current Weight  05/23/16 211 lb (95.7 kg)  05/08/16 205 lb (93 kg)  05/05/16 208 lb (94.3 kg)    Blood Pressure  BP Readings from Last 3 Encounters:  05/23/16 118/80  05/08/16 120/85  05/05/16 130/76     Admit date:  (Not on file) Last encounter with RMR:  05/05/2016   Allergy Morphine  Current Outpatient Prescriptions  Medication Sig Dispense Refill  . aspirin 325 MG tablet Take 1 tablet (325 mg total) by mouth daily. 30 tablet 11  . atorvastatin (LIPITOR) 10 MG tablet Take 1 tablet (10 mg total) by mouth at bedtime. 30 tablet 11  . carvedilol (COREG) 12.5 MG tablet Take 1 tablet (12.5 mg total) by mouth 2 (two) times daily. 60 tablet 11  . co-enzyme Q-10 30 MG capsule Take 100 mg by mouth daily.    . furosemide (LASIX) 20 MG tablet Take 20 mg by mouth daily.    . Garlic 10 MG CAPS Take 10 mg by mouth every evening.    . hydrochlorothiazide (HYDRODIURIL) 25 MG tablet Take 25 mg by mouth daily.  2  . oxyCODONE (ROXICODONE) 15 MG immediate release tablet Take 15 mg by mouth every 4 (four) hours as needed for pain.  0  . potassium chloride SA (K-DUR,KLOR-CON) 20 MEQ tablet Take 1 tablet (20 mEq total) by mouth daily. x3days 3 tablet 0  . ramipril (ALTACE) 10 MG capsule Take 1 capsule (10 mg total) by mouth daily. 30 capsule 11  . ranitidine (ZANTAC) 150 MG tablet Take 150 mg by mouth as needed for heartburn. OTC medication     No current facility-administered medications for this visit.     Discontinued Meds:    Medications Discontinued During This Encounter  Medication Reason  . nicotine (NICODERM CQ -  DOSED IN MG/24 HOURS) 21 mg/24hr patch Error  . potassium chloride (K-DUR) 10 MEQ tablet Error    Patient Active Problem List   Diagnosis Date Noted  . Acute on chronic diastolic heart failure ([place])     Priority: Low  . Chronic combined systolic (congestive) and diastolic (congestive) heart failure ([place]) 05/08/2016  . Acute CHF (congestive heart failure) ([place]) 04/18/2016  . ETOH abuse 04/18/2016  . RBBB 04/18/2016  . Cardiomyopathy ([place]) 04/18/2016  . Family history of coronary artery disease in father 04/18/2016  . Dyslipidemia 04/18/2016  . Elevated brain natriuretic peptide (BNP) level   . Interstitial edema   . Hemoptysis 04/11/2016  . Orthopnea 04/11/2016  . Elevated troponin I level   . Stroke ([place]) 10/28/2014  . Hypertensive emergency 10/28/2014  . Left sided numbness 10/28/2014  . Hepatomegaly 10/03/2014  . GERD (gastroesophageal reflux disease) 07/13/2013  . Hemorrhoids 07/13/2013  . Abdominal pain, chronic, right upper quadrant 05/03/2013  . Bowel habit changes 05/03/2013  . FH: colon cancer 05/03/2013  . Rectal bleeding 05/03/2013  . Hypokalemia 03/08/2013  . History of Wolff-Parkinson-White syndrome 03/08/2013  . Malignant hypertension 03/08/2013  . Polycythemia  03/08/2013  . Tobacco abuse 03/08/2013  . Chronic anxiety 03/08/2013  . Leukocytosis 03/08/2013  . Laceration of arm 03/08/2013  . Chest tightness 03/08/2013  . CAD (coronary artery disease) 08/11/2012  . Smoking 08/11/2012  . NIGHT SWEATS 10/23/2010  . ANXIETY STATE, UNSPECIFIED 12/23/2008  . Essential hypertension 12/23/2008  . SYNCOPE 12/23/2008  . Palpitations 12/23/2008  . TACHYCARDIA, HX OF 12/23/2008    LABS    Component Value Date/Time   NA 133 (L) 05/08/2016 0629   NA 133 (L) 05/07/2016 1153   NA 134 (L) 04/12/2016 0223   K 3.1 (L) 05/08/2016 0629   K 2.9 (L) 05/07/2016 1153   K 3.2 (L) 04/12/2016 0223   CL 95 (L) 05/08/2016 0629   CL 93 (L) 05/07/2016 1153   CL 94 (L)  04/12/2016 0223   CO2 30 05/08/2016 0629   CO2 31 05/07/2016 1153   CO2 29 04/12/2016 0223   GLUCOSE 94 05/08/2016 0629   GLUCOSE 99 05/07/2016 1153   GLUCOSE 68 04/12/2016 0223   BUN 17 05/08/2016 0629   BUN 17 05/07/2016 1153   BUN 8 04/12/2016 0223   CREATININE 1.23 05/08/2016 0629   CREATININE 1.28 (H) 05/07/2016 1153   CREATININE 1.33 (H) 04/12/2016 0223   CALCIUM 9.7 05/08/2016 0629   CALCIUM 9.6 05/07/2016 1153   CALCIUM 8.9 04/12/2016 0223   GFRNONAA >60 05/08/2016 0629   GFRNONAA >60 05/07/2016 1153   GFRNONAA >60 04/12/2016 0223   GFRAA >60 05/08/2016 0629   GFRAA >60 05/07/2016 1153   GFRAA >60 04/12/2016 0223   CMP     Component Value Date/Time   NA 133 (L) 05/08/2016 0629   K 3.1 (L) 05/08/2016 0629   CL 95 (L) 05/08/2016 0629   CO2 30 05/08/2016 0629   GLUCOSE 94 05/08/2016 0629   BUN 17 05/08/2016 0629   CREATININE 1.23 05/08/2016 0629   CALCIUM 9.7 05/08/2016 0629   PROT 6.3 10/29/2014 0858   ALBUMIN 3.2 (L) 10/29/2014 0858   AST 30 10/29/2014 0858   ALT 27 10/29/2014 0858   ALKPHOS 96 10/29/2014 0858   BILITOT 1.9 (H) 10/29/2014 0858   GFRNONAA >60 05/08/2016 0629   GFRAA >60 05/08/2016 0629       Component Value Date/Time   WBC 13.4 (H) 05/07/2016 1153   WBC 12.0 (H) 04/12/2016 0223   WBC 16.3 (H) 04/11/2016 0506   HGB 16.8 05/07/2016 1153   HGB 14.5 04/12/2016 0223   HGB 14.9 04/11/2016 0506   HCT 49.1 05/07/2016 1153   HCT 45.2 04/12/2016 0223   HCT 43.9 04/11/2016 0506   MCV 76.6 (L) 05/07/2016 1153   MCV 80.1 04/12/2016 0223   MCV 77.0 (L) 04/11/2016 0506    Lipid Panel     Component Value Date/Time   CHOL 187 10/29/2014 0858   TRIG 102 10/29/2014 0858   HDL 38 (L) 10/29/2014 0858   CHOLHDL 4.9 10/29/2014 0858   VLDL 20 10/29/2014 0858   LDLCALC 129 (H) 10/29/2014 0858    ABG    Component Value Date/Time   PHART 7.389 05/08/2016 0807   PCO2ART 48.6 (H) 05/08/2016 0807   PO2ART 79.0 (L) 05/08/2016 0807   HCO3 29.3 (H)  05/08/2016 0807   TCO2 31 05/08/2016 0807   ACIDBASEDEF 3.7 (H) 09/19/2014 1653   O2SAT 95.0 05/08/2016 0807     Lab Results  Component Value Date   TSH 1.252 09/19/2014   BNP (last 3 results)  Recent Labs  04/11/16 [id number]  BNP 292.3*    ProBNP (last 3 results) No results for input(s): PROBNP in the last 8760 hours.  Cardiac Panel (last 3 results) No results for input(s): CKTOTAL, CKMB, TROPONINI, RELINDX in the last 72 hours.  Iron/TIBC/Ferritin/ %Sat    Component Value Date/Time   IRON 147 (H) 09/19/2014 1633   TIBC 411 09/19/2014 1633   FERRITIN 112 09/19/2014 1633   IRONPCTSAT 36 09/19/2014 1633     EKG Orders placed or performed in visit on 05/08/16  . EKG 12-Lead     Prior Assessment and Plan Problem List as of 05/23/2016 Reviewed: 05/08/2016  7:18 AM by Peter Martinique, MD     Cardiovascular and Mediastinum   Acute on chronic diastolic heart failure [facility])   Essential hypertension   Last Assessment & Plan 04/18/2016 Office Visit Written 04/18/2016  4:25 PM by Erlene Quan, PA-C    Controlled      SYNCOPE   CAD (coronary artery disease)   Last Assessment & Plan 04/18/2016 Office Visit Written 04/18/2016  4:24 PM by Erlene Quan, PA-C    50% LAD in 2007      Malignant hypertension   Hemorrhoids   Last Assessment & Plan 07/13/2013 Office Visit Written 07/13/2013  4:10 PM by Mahala Menghini, PA-C    S/p hemorrhoid banding on 06/22/13. Suspect he had thrombosed hemorrhoid given his description. At any rate, he has improved significantly. He has noted a marked improvement in his hemorrhoids after one banding and is interested in second banding over the next few weeks. He will call in couple of weeks and if he is doing well at that time, then consider appointment with Dr. Gala Romney for repeat banding.       Stroke [facility])   Hypertensive emergency   Acute CHF (congestive heart failure) [facility])   Last Assessment & Plan 04/18/2016 Office Visit Written 04/18/2016  4:24 PM by Erlene Quan, PA-C    Pt admitted from home via EMS for acute CHF. He admitted he has been rationing his medications prior to this.       RBBB   Last Assessment & Plan 04/18/2016 Office Visit Written 04/18/2016  4:27 PM by Erlene Quan, PA-C    .      Cardiomyopathy [facility])   Last Assessment & Plan 04/18/2016 Office Visit Written 04/18/2016  4:28 PM by Erlene Quan, PA-C    Etiology not yet determined      Chronic combined systolic (congestive) and diastolic (congestive) heart failure ([place])     Respiratory   Hemoptysis     Digestive   Rectal bleeding   GERD (gastroesophageal reflux disease)   Last Assessment & Plan 07/13/2013 Office Visit Written 07/13/2013  4:08 PM by Mahala Menghini, PA-C    Doing much better since dietary changes, etoh cessation. Dexilant samples provided. If he wants to convert to omeprazole 20mg  daily in the near future, he would likely respond given significant lifestyle changes.       Hepatomegaly   Last Assessment & Plan 11/28/2014 Office Visit Written 11/28/2014  8:59 AM by Baird Cancer, PA-C    AFP today.        Musculoskeletal and Integument   NIGHT SWEATS   Laceration of arm     Other   ANXIETY STATE, UNSPECIFIED   Palpitations   TACHYCARDIA, HX OF   Smoking   Hypokalemia   History of Wolff-Parkinson-White syndrome   Last Assessment & Plan 04/18/2016 Office Visit Written  04/18/2016  4:26 PM by Erlene Quan, PA-C    Prior R[facility] at [facility]. He did have several minutes of tachycardia at home before admission but no documented arrhythmia in the hospital      Polycythemia   Last [place] 11/28/2014 Office Visit Written 11/28/2014  8:59 AM by Baird Cancer, PA-C    Jak2 negative, BCR/ABL negative, elevated carboxyhemoglobin of 6.9%, WNL epo level, in the setting of tobacco abuse without any identifiable vitamin deficiencies.  S/P three therapeutic phlebotomies on 1/8, 1/13, and 10/03/2014. Labs today: CBC diff, iron/TIBC, ferritin, AFP. Labs in 3 months:  CBC diff.  Return in 3 months for follow-up.      Tobacco abuse   Last Assessment & Plan 04/18/2016 Office Visit Written 04/18/2016  4:27 PM by Erlene Quan, PA-C    .      Chronic anxiety   Leukocytosis   Chest tightness   Abdominal pain, chronic, right upper quadrant   Last Assessment & Plan 05/03/2013 Office Visit Edited 05/03/2013  2:41 PM by Mahala Menghini, PA-C    45 year old with 4 month history of epigastric/right upper quadrant abdominal pain, initially associated with nausea/vomiting, "clay-colored stools", loose stool. He has had CT of the abdomen pelvis, abdominal ultrasound, LFTs which were all normal. Tried over-the-counter antacids without relief. He notes increased pain related to certain foods. Denies typical heartburn. Also notably on exam he has pain over the rib cage which is likely musculoskeletal.  Other GI issues included change in bowel habits, bright red blood per thumb at times, family history of colon cancer at a young age. Overdue for colonoscopy at this time.  At this point, would recommend EGD and colonoscopy for further evaluation of above-stated symptoms and for colon cancer screening. Patient requires chronic narcotics therefore we'll offer deep sedation in the OR. I have discussed the risks, alternatives, benefits with regards to but not limited to the risk of reaction to medication, bleeding, infection, perforation and the patient is agreeable to proceed. Written consent to be obtained.  Start Dexilant 60mg  daily.   If abdominal pain remains unexplained after procedures, consider HIDA scan to complete GI work-up.       Bowel habit changes   FH: colon cancer   Left sided numbness   Orthopnea   Elevated troponin I level   Elevated brain natriuretic peptide (BNP) level   Interstitial edema   ETOH abuse   Last Assessment & Plan 04/18/2016 Office Visit Written 04/18/2016  4:27 PM by Erlene Quan, PA-C    "2 beers a day"      Family history of coronary artery  disease in father   Last Assessment & Plan 04/18/2016 Office Visit Written 04/18/2016  4:29 PM by Erlene Quan, PA-C    F had an MI in his 75's      Dyslipidemia   Last Assessment & Plan 04/18/2016 Office Visit Written 04/18/2016  4:30 PM by Erlene Quan, PA-C    Last LDL 126- he couldn't afford statin Rx          Imaging: No results found.

## 2016-05-23 NOTE — Progress Notes (Signed)
Cardiology Office Note   Date:  05/23/2016   ID:  Timothy Barry., DOB 05-10-1971, MRN [id number]  PCP:  Glo Herring., MD  Cardiologist: Cloria Spring, NP   Chief Complaint  Patient presents with  . Hypertension  . Coronary Artery Disease      History of Present Illness: Timothy [name]. is a 45 y.o. male who presents for ongoing assessment and management of hypertension, history of CHF, Wolff-Parkinson-White syndrome, status post ablation 3 at [facility]). The patient was last in the office on 05/05/2016. The patient was set up for right heart cath and left heart cath. He is here for post procedure follow-up.  Conclusion     Prox LAD to Dist LAD lesion, 20 %stenosed.  1st Diag lesion, 50 %stenosed.  Ost Ramus to Ramus lesion, 30 %stenosed.  Ost 1st Mrg to 1st Mrg lesion, 20 %stenosed.  Prox RCA to Mid RCA lesion, 25 %stenosed.  Ost RPDA to RPDA lesion, 50 %stenosed.  There is mild left ventricular systolic dysfunction.  LV end diastolic pressure is mildly elevated.  The left ventricular ejection fraction is 45-50% by visual estimate.   1. Nonobstructive CAD 2. Mild LV dysfunction- EF 45-50% 3. Elevated LVEDP 4. Normal right heart pressures   He is a challenging patient is a continues to be symptomatic concerning being tired fatigue chest discomfort and dyspnea. I've gone over his cardiac cath results. 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 He has not been able to get ahold of Timothy [name] osseous runout of altered taste. He has had some clonidine that he had in the past which she is taking as a substitute. He says it sometimes makes him feel dizzy. He continues to smoke heavily.    Past Medical History:  Diagnosis Date  . Anxiety   . Arthritis   . CAD (coronary artery disease) 2007   50% stenosis small diagonal  . Central sleep apnea   . Chronic pain    since 1998 has been on chronic pain  medication  . GERD (gastroesophageal reflux disease)   . Hemorrhoids   . Hepatomegaly 10/03/2014  . HTN (hypertension)   . Palpitations   . Syncope   . Tachycardia   . Tobacco abuse   . Tubular adenoma   . Wolff-Parkinson-White (WPW) syndrome    says was cured with ablation    Past Surgical History:  Procedure Laterality Date  . ADENOIDECTOMY    . APPENDECTOMY    . ATRIAL ABLATION SURGERY    . AV NODE ABLATION    . BIOPSY N/A 05/12/2013   Procedure: BIOPSY;  Surgeon: Daneil Dolin, MD;  Location: [facility];  Service: Endoscopy;  Laterality: N/A;  . CARDIAC CATHETERIZATION N/A 05/08/2016   Procedure: Right/Left Heart Cath and Coronary Angiography;  Surgeon: Peter M Martinique, MD;  Location: [facility] CV LAB;  Service: Cardiovascular;  Laterality: N/A;  . CARDIAC ELECTROPHYSIOLOGY STUDY AND ABLATION     SA node ablation  . COLONOSCOPY     age 48  . COLONOSCOPY WITH PROPOFOL N/A 05/12/2013   Dr. Gala Romney- hemorrhoids, tubular adenoma  . ESOPHAGOGASTRODUODENOSCOPY (EGD) WITH PROPOFOL N/A 05/12/2013   Dr. Gala Romney- hiatal hernia, erosive reflux esophagitis  . HAND SURGERY    . HEMORRHOID BANDING  06/22/13  . POLYPECTOMY N/A 05/12/2013   Procedure: POLYPECTOMY;  Surgeon: Daneil Dolin, MD;  Location: [facility];  Service: Endoscopy;  Laterality: N/A;  . SHOULDER SURGERY  Current Outpatient Prescriptions  Medication Sig Dispense Refill  . aspirin 325 MG tablet Take 1 tablet (325 mg total) by mouth daily. 30 tablet 11  . atorvastatin (LIPITOR) 10 MG tablet Take 1 tablet (10 mg total) by mouth at bedtime. 30 tablet 11  . carvedilol (COREG) 12.5 MG tablet Take 1 tablet (12.5 mg total) by mouth 2 (two) times daily. 60 tablet 11  . co-enzyme Q-10 30 MG capsule Take 100 mg by mouth daily.    . furosemide (LASIX) 20 MG tablet Take 20 mg by mouth daily.    . Garlic 10 MG CAPS Take 10 mg by mouth every evening.    . hydrochlorothiazide (HYDRODIURIL) 25 MG tablet Take 25 mg by mouth daily.  2  .  oxyCODONE (ROXICODONE) 15 MG immediate release tablet Take 15 mg by mouth every 4 (four) hours as needed for pain.  0  . potassium chloride SA (K-DUR,KLOR-CON) 20 MEQ tablet Take 1 tablet (20 mEq total) by mouth daily. x3days 3 tablet 0  . ramipril (ALTACE) 10 MG capsule Take 1 capsule (10 mg total) by mouth daily. 30 capsule 11  . ranitidine (ZANTAC) 150 MG tablet Take 150 mg by mouth as needed for heartburn. OTC medication     No current facility-administered medications for this visit.     Allergies:   Morphine    Social History:  The patient  reports that he has been smoking Cigarettes.  He has a 12.50 pack-year smoking history. He has never used smokeless tobacco. He reports that he drinks about 1.2 oz of alcohol per week . He reports that he does not use drugs.   Family History:  The patient's family history includes Breast cancer in his mother; Colon cancer in his father; Coronary artery disease in his father; Hypertension in his mother.    ROS: All other systems are reviewed and negative. Unless otherwise mentioned in H&P    PHYSICAL EXAM: VS:  BP 118/80   Pulse 69   Ht 6' (1.829 m)   Wt 211 lb (95.7 kg)   SpO2 97%   BMI 28.62 kg/m  , BMI Body mass index is 28.62 kg/m. GEN: Well nourished, well developed, in no acute distress  HEENT: normal  Neck: no JVD, carotid bruits, or masses Cardiac: RRR; no murmurs, rubs, or gallops,no edema  Respiratory:  clear to auscultation bilaterally, normal work of breathing. He smells heavily of cigarettes. GI: soft, nontender, nondistended, + BS MS: no deformity or atrophy  Skin: warm and dry, no rash Neuro:  Strength and sensation are intact Psych: euthymic mood, full affect    Recent Labs: 04/11/2016: B Natriuretic Peptide 292.3 05/07/2016: Hemoglobin 16.8; Platelets 377 05/08/2016: BUN 17; Creatinine, Ser 1.23; Potassium 3.1; Sodium 133    Lipid Panel    Component Value Date/Time   CHOL 187 10/29/2014 0858   TRIG 102  10/29/2014 0858   HDL 38 (L) 10/29/2014 0858   CHOLHDL 4.9 10/29/2014 0858   VLDL 20 10/29/2014 0858   LDLCALC 129 (H) 10/29/2014 0858      Wt Readings from Last 3 Encounters:  05/23/16 211 lb (95.7 kg)  05/08/16 205 lb (93 kg)  05/05/16 208 lb (94.3 kg)     ASSESSMENT AND PLAN:  1.  Nonischemic cardiomyopathy: Cardiac catheterization did not reveal any obstructive disease. Pulmonary pressures were normal. The patient has continued complaints overall fatigue and dizziness. I will decrease his carvedilol to 9.375 twice a day to help with fatigue. He is to  continue taking it as directed. I will have a follow-up echocardiogram completed November for reassessment of LV systolic function.  He has not been able to get his Altace as directed. We are contacting signed Denyse Amass concerning his all day's prescription. I've advised him to continue take the clonidine daily until this is resolved. Did not take it when necessary.  2. Hypertension, compliance issues concerning side effects or any issues with him also states that he feels very fatigued and has not been able to get medications from med assist. My plan is to increase his all tasted 20 mg daily once we get down on the carvedilol but will have to follow-up with Denyse Amass in order to make sure he's getting it  3. Ongoing tobacco abuse: I have advised on smoking cessation. Current medicines are reviewed at length with the patient today.  He is not inclined to quit at this time.  Labs/ tests ordered today include:Echocardiogram No orders of the defined types were placed in this encounter.    Disposition:   FU with 3 months.  Signed, Jory Sims, NP  05/23/2016 2:11 PM    [facility] [address], [place], [place] [address] Phone: [phone]; Fax: [phone]

## 2016-05-28 DIAGNOSIS — Z719 Counseling, unspecified: Secondary | ICD-10-CM | POA: Diagnosis not present

## 2016-05-28 DIAGNOSIS — R5383 Other fatigue: Secondary | ICD-10-CM | POA: Diagnosis not present

## 2016-06-01 ENCOUNTER — Telehealth: Payer: Self-pay | Admitting: Physician Assistant

## 2016-06-01 NOTE — Telephone Encounter (Signed)
Timothy [name]. is a 45 y.o. male with hx of NICM with EF 35-40, mild non-obs CAD by LHC in 8/17 who called the answering service with complaints of R arm pain. LHC was done via R FA approach on 05/08/16.  R wrist was attempted but catheter could not be advanced. Patient was doing well up until 2 days ago.  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  He started having pain in his R arm in the area of the radial access from his cath up his arm to his elbow. Pain noted with movement and to touch.  No significant redness.  It is hard to extend his arm at his elbow. No fevers or chills.  No chest pain.  No dyspnea.  He is concerned this is a complication of his cath. I have tried to reassure him that it is extremely unlikely that this is from the attempt at radial access for his cath on 05/08/16 as he had no symptoms until 2 days ago.  There are no symptoms of infection. He remains concerned that this is related to his cath. I will ask our [place] office to call him tomorrow to arrange an appointment to evaluate this. Otherwise, if he worsens today, I have asked him to go to the ED. Richardson Dopp, PA-C   06/01/2016 1:34 PM

## 2016-06-02 ENCOUNTER — Telehealth: Payer: Self-pay | Admitting: Adult Health

## 2016-06-02 NOTE — Telephone Encounter (Signed)
Please give the pt a call @ [phone] --he's unable to come in this afternoon. Pt states he's having some bruising now on his arm w/ less pain.

## 2016-06-02 NOTE — Telephone Encounter (Signed)
Please make nurse visit for him.

## 2016-06-02 NOTE — Telephone Encounter (Signed)
Pt requests to be seen tomorrow,states he has more bruising at site,apt made for 4 pm

## 2016-06-03 ENCOUNTER — Ambulatory Visit (INDEPENDENT_AMBULATORY_CARE_PROVIDER_SITE_OTHER): Payer: Medicare Other

## 2016-06-03 VITALS — BP 124/68 | HR 94

## 2016-06-03 DIAGNOSIS — M79601 Pain in right arm: Secondary | ICD-10-CM

## 2016-06-03 NOTE — Patient Instructions (Signed)
Get ultrasound of right arm,we will call you with results     Thank you for choosing [facility] !

## 2016-06-03 NOTE — Progress Notes (Signed)
Had attempted right radial artery catherization 3 weeks ago,still has pain and aching,stinging ensation.Doppler noted strong radial and brachial pulses,getting right arm [place] to r/o DVT

## 2016-06-03 NOTE — Telephone Encounter (Signed)
Appt. Made for 06/02/16 and rescheduled for 06/03/16 per patient.

## 2016-06-06 ENCOUNTER — Ambulatory Visit (HOSPITAL_COMMUNITY): Admission: RE | Admit: 2016-06-06 | Payer: Medicare Other | Source: Ambulatory Visit

## 2016-08-13 DIAGNOSIS — Z6828 Body mass index (BMI) 28.0-28.9, adult: Secondary | ICD-10-CM | POA: Diagnosis not present

## 2016-08-13 DIAGNOSIS — M47812 Spondylosis without myelopathy or radiculopathy, cervical region: Secondary | ICD-10-CM | POA: Diagnosis not present

## 2016-08-13 DIAGNOSIS — Z1389 Encounter for screening for other disorder: Secondary | ICD-10-CM | POA: Diagnosis not present

## 2016-08-13 DIAGNOSIS — J329 Chronic sinusitis, unspecified: Secondary | ICD-10-CM | POA: Diagnosis not present

## 2016-08-13 DIAGNOSIS — G894 Chronic pain syndrome: Secondary | ICD-10-CM | POA: Diagnosis not present

## 2016-08-13 DIAGNOSIS — I10 Essential (primary) hypertension: Secondary | ICD-10-CM | POA: Diagnosis not present

## 2016-11-06 DIAGNOSIS — I429 Cardiomyopathy, unspecified: Secondary | ICD-10-CM | POA: Diagnosis not present

## 2016-11-06 DIAGNOSIS — G894 Chronic pain syndrome: Secondary | ICD-10-CM | POA: Diagnosis not present

## 2016-11-06 DIAGNOSIS — I5032 Chronic diastolic (congestive) heart failure: Secondary | ICD-10-CM | POA: Diagnosis not present

## 2016-11-06 DIAGNOSIS — Z6827 Body mass index (BMI) 27.0-27.9, adult: Secondary | ICD-10-CM | POA: Diagnosis not present

## 2016-11-06 DIAGNOSIS — I10 Essential (primary) hypertension: Secondary | ICD-10-CM | POA: Diagnosis not present

## 2016-11-06 DIAGNOSIS — E663 Overweight: Secondary | ICD-10-CM | POA: Diagnosis not present

## 2017-01-28 DIAGNOSIS — I429 Cardiomyopathy, unspecified: Secondary | ICD-10-CM | POA: Diagnosis not present

## 2017-01-28 DIAGNOSIS — Z6826 Body mass index (BMI) 26.0-26.9, adult: Secondary | ICD-10-CM | POA: Diagnosis not present

## 2017-01-28 DIAGNOSIS — G894 Chronic pain syndrome: Secondary | ICD-10-CM | POA: Diagnosis not present

## 2017-01-28 DIAGNOSIS — I639 Cerebral infarction, unspecified: Secondary | ICD-10-CM | POA: Diagnosis not present

## 2017-01-28 DIAGNOSIS — E782 Mixed hyperlipidemia: Secondary | ICD-10-CM | POA: Diagnosis not present

## 2017-01-28 DIAGNOSIS — Z125 Encounter for screening for malignant neoplasm of prostate: Secondary | ICD-10-CM | POA: Diagnosis not present

## 2017-01-28 DIAGNOSIS — E663 Overweight: Secondary | ICD-10-CM | POA: Diagnosis not present

## 2017-01-28 DIAGNOSIS — Z79891 Long term (current) use of opiate analgesic: Secondary | ICD-10-CM | POA: Diagnosis not present

## 2017-01-28 DIAGNOSIS — Z720 Tobacco use: Secondary | ICD-10-CM | POA: Diagnosis not present

## 2017-01-28 DIAGNOSIS — I5032 Chronic diastolic (congestive) heart failure: Secondary | ICD-10-CM | POA: Diagnosis not present

## 2017-01-28 DIAGNOSIS — Z719 Counseling, unspecified: Secondary | ICD-10-CM | POA: Diagnosis not present

## 2017-05-03 ENCOUNTER — Other Ambulatory Visit: Payer: Self-pay | Admitting: Cardiology

## 2017-05-04 ENCOUNTER — Other Ambulatory Visit: Payer: Self-pay | Admitting: Adult Health

## 2017-05-04 ENCOUNTER — Other Ambulatory Visit: Payer: Self-pay

## 2017-05-04 MED ORDER — CARVEDILOL 6.25 MG PO TABS: 9.7360 mg | ORAL_TABLET | Freq: Two times a day (BID) | ORAL | 0 refills | Status: DC

## 2017-05-04 NOTE — Telephone Encounter (Signed)
Refilled 1 month supply of coreg 9.375 mg twice a day, lm for pt to call for fu apt,was due to come back 08/2016

## 2017-05-28 DIAGNOSIS — I10 Essential (primary) hypertension: Secondary | ICD-10-CM | POA: Diagnosis not present

## 2017-05-28 DIAGNOSIS — I5032 Chronic diastolic (congestive) heart failure: Secondary | ICD-10-CM | POA: Diagnosis not present

## 2017-05-28 DIAGNOSIS — G894 Chronic pain syndrome: Secondary | ICD-10-CM | POA: Diagnosis not present

## 2017-05-28 DIAGNOSIS — E663 Overweight: Secondary | ICD-10-CM | POA: Diagnosis not present

## 2017-05-28 DIAGNOSIS — Z6826 Body mass index (BMI) 26.0-26.9, adult: Secondary | ICD-10-CM | POA: Diagnosis not present

## 2017-05-28 DIAGNOSIS — Z23 Encounter for immunization: Secondary | ICD-10-CM | POA: Diagnosis not present

## 2017-05-28 DIAGNOSIS — I429 Cardiomyopathy, unspecified: Secondary | ICD-10-CM | POA: Diagnosis not present

## 2017-05-28 DIAGNOSIS — J418 Mixed simple and mucopurulent chronic bronchitis: Secondary | ICD-10-CM | POA: Diagnosis not present

## 2017-05-28 DIAGNOSIS — J329 Chronic sinusitis, unspecified: Secondary | ICD-10-CM | POA: Diagnosis not present

## 2017-08-20 DIAGNOSIS — I10 Essential (primary) hypertension: Secondary | ICD-10-CM | POA: Diagnosis not present

## 2017-08-20 DIAGNOSIS — Z1389 Encounter for screening for other disorder: Secondary | ICD-10-CM | POA: Diagnosis not present

## 2017-08-20 DIAGNOSIS — E663 Overweight: Secondary | ICD-10-CM | POA: Diagnosis not present

## 2017-08-20 DIAGNOSIS — Z6826 Body mass index (BMI) 26.0-26.9, adult: Secondary | ICD-10-CM | POA: Diagnosis not present

## 2017-08-20 DIAGNOSIS — I429 Cardiomyopathy, unspecified: Secondary | ICD-10-CM | POA: Diagnosis not present

## 2017-08-20 DIAGNOSIS — F419 Anxiety disorder, unspecified: Secondary | ICD-10-CM | POA: Diagnosis not present

## 2017-08-20 DIAGNOSIS — M5417 Radiculopathy, lumbosacral region: Secondary | ICD-10-CM | POA: Diagnosis not present

## 2017-08-20 DIAGNOSIS — D751 Secondary polycythemia: Secondary | ICD-10-CM | POA: Diagnosis not present

## 2017-08-20 DIAGNOSIS — G894 Chronic pain syndrome: Secondary | ICD-10-CM | POA: Diagnosis not present

## 2017-08-20 DIAGNOSIS — I639 Cerebral infarction, unspecified: Secondary | ICD-10-CM | POA: Diagnosis not present

## 2017-11-13 DIAGNOSIS — I471 Supraventricular tachycardia: Secondary | ICD-10-CM | POA: Diagnosis not present

## 2017-11-13 DIAGNOSIS — I5042 Chronic combined systolic (congestive) and diastolic (congestive) heart failure: Secondary | ICD-10-CM | POA: Diagnosis not present

## 2017-11-13 DIAGNOSIS — I10 Essential (primary) hypertension: Secondary | ICD-10-CM | POA: Diagnosis not present

## 2017-11-13 DIAGNOSIS — Z6827 Body mass index (BMI) 27.0-27.9, adult: Secondary | ICD-10-CM | POA: Diagnosis not present

## 2017-11-13 DIAGNOSIS — E663 Overweight: Secondary | ICD-10-CM | POA: Diagnosis not present

## 2017-11-13 DIAGNOSIS — Z1389 Encounter for screening for other disorder: Secondary | ICD-10-CM | POA: Diagnosis not present

## 2017-11-13 DIAGNOSIS — I639 Cerebral infarction, unspecified: Secondary | ICD-10-CM | POA: Diagnosis not present

## 2017-11-13 DIAGNOSIS — I429 Cardiomyopathy, unspecified: Secondary | ICD-10-CM | POA: Diagnosis not present

## 2017-11-13 DIAGNOSIS — G894 Chronic pain syndrome: Secondary | ICD-10-CM | POA: Diagnosis not present

## 2017-11-13 DIAGNOSIS — I5033 Acute on chronic diastolic (congestive) heart failure: Secondary | ICD-10-CM | POA: Diagnosis not present

## 2017-11-13 DIAGNOSIS — R002 Palpitations: Secondary | ICD-10-CM | POA: Diagnosis not present

## 2017-12-28 DIAGNOSIS — I10 Essential (primary) hypertension: Secondary | ICD-10-CM | POA: Diagnosis not present

## 2017-12-28 DIAGNOSIS — I5042 Chronic combined systolic (congestive) and diastolic (congestive) heart failure: Secondary | ICD-10-CM | POA: Diagnosis not present

## 2017-12-28 DIAGNOSIS — E663 Overweight: Secondary | ICD-10-CM | POA: Diagnosis not present

## 2017-12-28 DIAGNOSIS — I471 Supraventricular tachycardia: Secondary | ICD-10-CM | POA: Diagnosis not present

## 2017-12-28 DIAGNOSIS — N200 Calculus of kidney: Secondary | ICD-10-CM | POA: Diagnosis not present

## 2017-12-28 DIAGNOSIS — Z6826 Body mass index (BMI) 26.0-26.9, adult: Secondary | ICD-10-CM | POA: Diagnosis not present

## 2017-12-28 DIAGNOSIS — G894 Chronic pain syndrome: Secondary | ICD-10-CM | POA: Diagnosis not present

## 2017-12-28 DIAGNOSIS — J329 Chronic sinusitis, unspecified: Secondary | ICD-10-CM | POA: Diagnosis not present

## 2018-01-12 DIAGNOSIS — M62830 Muscle spasm of back: Secondary | ICD-10-CM | POA: Diagnosis not present

## 2018-01-12 DIAGNOSIS — M5417 Radiculopathy, lumbosacral region: Secondary | ICD-10-CM | POA: Diagnosis not present

## 2018-01-12 DIAGNOSIS — E663 Overweight: Secondary | ICD-10-CM | POA: Diagnosis not present

## 2018-01-12 DIAGNOSIS — M5136 Other intervertebral disc degeneration, lumbar region: Secondary | ICD-10-CM | POA: Diagnosis not present

## 2018-01-12 DIAGNOSIS — I10 Essential (primary) hypertension: Secondary | ICD-10-CM | POA: Diagnosis not present

## 2018-01-12 DIAGNOSIS — Z6826 Body mass index (BMI) 26.0-26.9, adult: Secondary | ICD-10-CM | POA: Diagnosis not present

## 2018-01-12 DIAGNOSIS — M5410 Radiculopathy, site unspecified: Secondary | ICD-10-CM | POA: Diagnosis not present

## 2018-02-09 DIAGNOSIS — Z6828 Body mass index (BMI) 28.0-28.9, adult: Secondary | ICD-10-CM | POA: Diagnosis not present

## 2018-02-09 DIAGNOSIS — G894 Chronic pain syndrome: Secondary | ICD-10-CM | POA: Diagnosis not present

## 2018-02-09 DIAGNOSIS — Z1389 Encounter for screening for other disorder: Secondary | ICD-10-CM | POA: Diagnosis not present

## 2018-02-09 DIAGNOSIS — S80862A Insect bite (nonvenomous), left lower leg, initial encounter: Secondary | ICD-10-CM | POA: Diagnosis not present

## 2018-02-09 DIAGNOSIS — E663 Overweight: Secondary | ICD-10-CM | POA: Diagnosis not present

## 2018-02-09 DIAGNOSIS — I10 Essential (primary) hypertension: Secondary | ICD-10-CM | POA: Diagnosis not present

## 2018-04-26 ENCOUNTER — Encounter: Payer: Self-pay | Admitting: Internal Medicine

## 2018-05-04 DIAGNOSIS — Z6827 Body mass index (BMI) 27.0-27.9, adult: Secondary | ICD-10-CM | POA: Diagnosis not present

## 2018-05-04 DIAGNOSIS — R079 Chest pain, unspecified: Secondary | ICD-10-CM | POA: Diagnosis not present

## 2018-05-04 DIAGNOSIS — I10 Essential (primary) hypertension: Secondary | ICD-10-CM | POA: Diagnosis not present

## 2018-05-04 DIAGNOSIS — R002 Palpitations: Secondary | ICD-10-CM | POA: Diagnosis not present

## 2018-05-04 DIAGNOSIS — E663 Overweight: Secondary | ICD-10-CM | POA: Diagnosis not present

## 2018-05-04 DIAGNOSIS — M5417 Radiculopathy, lumbosacral region: Secondary | ICD-10-CM | POA: Diagnosis not present

## 2018-05-04 DIAGNOSIS — Z23 Encounter for immunization: Secondary | ICD-10-CM | POA: Diagnosis not present

## 2018-05-05 DIAGNOSIS — Z23 Encounter for immunization: Secondary | ICD-10-CM | POA: Diagnosis not present

## 2018-05-05 DIAGNOSIS — M5417 Radiculopathy, lumbosacral region: Secondary | ICD-10-CM | POA: Diagnosis not present

## 2018-05-05 DIAGNOSIS — R079 Chest pain, unspecified: Secondary | ICD-10-CM | POA: Diagnosis not present

## 2018-05-05 DIAGNOSIS — I10 Essential (primary) hypertension: Secondary | ICD-10-CM | POA: Diagnosis not present

## 2018-05-05 DIAGNOSIS — E663 Overweight: Secondary | ICD-10-CM | POA: Diagnosis not present

## 2018-05-05 DIAGNOSIS — R002 Palpitations: Secondary | ICD-10-CM | POA: Diagnosis not present

## 2018-05-05 DIAGNOSIS — Z6827 Body mass index (BMI) 27.0-27.9, adult: Secondary | ICD-10-CM | POA: Diagnosis not present

## 2018-06-23 NOTE — Progress Notes (Deleted)
Cardiology Office Note   Date:  06/23/2018   ID:  Timothy Barry., DOB 03-01-1971, MRN [id number]  PCP:  Redmond School, MD  Cardiologist: Dr.Branch  No chief complaint on file.    History of Present Illness: Timothy [name]. is a 47 y.o. male who presents for ongoing assessment and management of hypertension, CHF, NICM WPW s/p ablation X 3 at [facility]. He had R& L heart cath in 04/2016 revealing non-obstructive CAD with normal right heart pressures. He has been working with patient assistance in [place] for medications as he is unable to afford them. He unfortunately continues to smoke and has no plans to quit,   Past Medical History:  Diagnosis Date  . Anxiety   . Arthritis   . CAD (coronary artery disease) 2007   50% stenosis small diagonal  . Central sleep apnea   . Chronic pain    since 1998 has been on chronic pain medication  . GERD (gastroesophageal reflux disease)   . Hemorrhoids   . Hepatomegaly 10/03/2014  . HTN (hypertension)   . Palpitations   . Syncope   . Tachycardia   . Tobacco abuse   . Tubular adenoma   . Wolff-Parkinson-White (WPW) syndrome    says was cured with ablation    Past Surgical History:  Procedure Laterality Date  . ADENOIDECTOMY    . APPENDECTOMY    . ATRIAL ABLATION SURGERY    . AV NODE ABLATION    . BIOPSY N/A 05/12/2013   Procedure: BIOPSY;  Surgeon: Daneil Dolin, MD;  Location: [facility];  Service: Endoscopy;  Laterality: N/A;  . CARDIAC CATHETERIZATION N/A 05/08/2016   Procedure: Right/Left Heart Cath and Coronary Angiography;  Surgeon: Peter M Martinique, MD;  Location: [facility] CV LAB;  Service: Cardiovascular;  Laterality: N/A;  . CARDIAC ELECTROPHYSIOLOGY STUDY AND ABLATION     SA node ablation  . COLONOSCOPY     age 56  . COLONOSCOPY WITH PROPOFOL N/A 05/12/2013   Dr. Gala Romney- hemorrhoids, tubular adenoma  . ESOPHAGOGASTRODUODENOSCOPY (EGD) WITH PROPOFOL N/A 05/12/2013   Dr. Gala Romney- hiatal hernia, erosive reflux esophagitis    . HAND SURGERY    . HEMORRHOID BANDING  06/22/13  . POLYPECTOMY N/A 05/12/2013   Procedure: POLYPECTOMY;  Surgeon: Daneil Dolin, MD;  Location: [facility];  Service: Endoscopy;  Laterality: N/A;  . SHOULDER SURGERY       Current Outpatient Medications  Medication Sig Dispense Refill  . aspirin 325 MG tablet Take 1 tablet (325 mg total) by mouth daily. 30 tablet 11  . atorvastatin (LIPITOR) 10 MG tablet Take 1 tablet (10 mg total) by mouth at bedtime. 30 tablet 11  . carvedilol (COREG) 6.25 MG tablet TAKE 1 AND 1/2 TABLETS BY MOUTH TWICE DAILY 270 tablet 0  . co-enzyme Q-10 30 MG capsule Take 100 mg by mouth daily.    . furosemide (LASIX) 20 MG tablet Take 20 mg by mouth daily.    . Garlic 10 MG CAPS Take 10 mg by mouth every evening.    . hydrochlorothiazide (HYDRODIURIL) 25 MG tablet Take 25 mg by mouth daily.  2  . oxyCODONE (ROXICODONE) 15 MG immediate release tablet Take 15 mg by mouth every 4 (four) hours as needed for pain.  0  . potassium chloride SA (K-DUR,KLOR-CON) 20 MEQ tablet Take 1 tablet (20 mEq total) by mouth daily. x3days 3 tablet 0  . ramipril (ALTACE) 10 MG capsule Take 1 capsule (10 mg total) by  mouth daily. 30 capsule 11  . ranitidine (ZANTAC) 150 MG tablet Take 150 mg by mouth as needed for heartburn. OTC medication     No current facility-administered medications for this visit.     Allergies:   Morphine    Social History:  The patient  reports that he has been smoking cigarettes. He has a 12.50 pack-year smoking history. He has never used smokeless tobacco. He reports that he drinks about 2.0 standard drinks of alcohol per week. He reports that he does not use drugs.   Family History:  The patient's family history includes Breast cancer in his mother; Colon cancer in his father; Coronary artery disease in his father; Hypertension in his mother.    ROS: All other systems are reviewed and negative. Unless otherwise mentioned in H&P    PHYSICAL EXAM: VS:  There  were no vitals taken for this visit. , BMI There is no height or weight on file to calculate BMI. GEN: Well nourished, well developed, in no acute distress HEENT: normal Neck: no JVD, carotid bruits, or masses Cardiac: ***RRR; no murmurs, rubs, or gallops,no edema  Respiratory:  Clear to auscultation bilaterally, normal work of breathing GI: soft, nontender, nondistended, + BS MS: no deformity or atrophy Skin: warm and dry, no rash Neuro:  Strength and sensation are intact Psych: euthymic mood, full affect   EKG:  EKG {ACTION; IS/IS [id number]} ordered today. The ekg ordered today demonstrates ***   Recent Labs: No results found for requested labs within last 8760 hours.    Lipid Panel    Component Value Date/Time   CHOL 187 10/29/2014 0858   TRIG 102 10/29/2014 0858   HDL 38 (L) 10/29/2014 0858   CHOLHDL 4.9 10/29/2014 0858   VLDL 20 10/29/2014 0858   LDLCALC 129 (H) 10/29/2014 0858      Wt Readings from Last 3 Encounters:  05/23/16 211 lb (95.7 kg)  05/08/16 205 lb (93 kg)  05/05/16 208 lb (94.3 kg)      Other studies Reviewed: Additional studies/ records that were reviewed today include: ***. Review of the above records demonstrates: ***   ASSESSMENT AND PLAN:  1.  ***   Current medicines are reviewed at length with the patient today.    Labs/ tests ordered today include: *** Phill Myron. [place], ANP, AACC   06/23/2018 7:50 AM    [place] [address] Office [phone] Fax [phone]

## 2018-06-24 ENCOUNTER — Ambulatory Visit: Payer: Medicare Other | Admitting: Adult Health

## 2018-06-24 DIAGNOSIS — R0989 Other specified symptoms and signs involving the circulatory and respiratory systems: Secondary | ICD-10-CM

## 2018-06-25 ENCOUNTER — Encounter: Payer: Self-pay | Admitting: *Deleted

## 2018-07-28 DIAGNOSIS — G894 Chronic pain syndrome: Secondary | ICD-10-CM | POA: Diagnosis not present

## 2018-07-28 DIAGNOSIS — I10 Essential (primary) hypertension: Secondary | ICD-10-CM | POA: Diagnosis not present

## 2018-07-28 DIAGNOSIS — Z6828 Body mass index (BMI) 28.0-28.9, adult: Secondary | ICD-10-CM | POA: Diagnosis not present

## 2018-07-28 DIAGNOSIS — M25519 Pain in unspecified shoulder: Secondary | ICD-10-CM | POA: Diagnosis not present

## 2018-07-28 DIAGNOSIS — I2510 Atherosclerotic heart disease of native coronary artery without angina pectoris: Secondary | ICD-10-CM | POA: Diagnosis not present

## 2018-07-28 DIAGNOSIS — M5432 Sciatica, left side: Secondary | ICD-10-CM | POA: Diagnosis not present

## 2018-07-28 DIAGNOSIS — Z0001 Encounter for general adult medical examination with abnormal findings: Secondary | ICD-10-CM | POA: Diagnosis not present

## 2018-07-28 DIAGNOSIS — D751 Secondary polycythemia: Secondary | ICD-10-CM | POA: Diagnosis not present

## 2018-07-28 DIAGNOSIS — E782 Mixed hyperlipidemia: Secondary | ICD-10-CM | POA: Diagnosis not present

## 2018-10-22 DIAGNOSIS — I639 Cerebral infarction, unspecified: Secondary | ICD-10-CM | POA: Diagnosis not present

## 2018-10-22 DIAGNOSIS — E663 Overweight: Secondary | ICD-10-CM | POA: Diagnosis not present

## 2018-10-22 DIAGNOSIS — Z1389 Encounter for screening for other disorder: Secondary | ICD-10-CM | POA: Diagnosis not present

## 2018-10-22 DIAGNOSIS — M545 Low back pain: Secondary | ICD-10-CM | POA: Diagnosis not present

## 2018-10-22 DIAGNOSIS — I471 Supraventricular tachycardia: Secondary | ICD-10-CM | POA: Diagnosis not present

## 2018-10-22 DIAGNOSIS — I429 Cardiomyopathy, unspecified: Secondary | ICD-10-CM | POA: Diagnosis not present

## 2018-10-22 DIAGNOSIS — I10 Essential (primary) hypertension: Secondary | ICD-10-CM | POA: Diagnosis not present

## 2018-10-22 DIAGNOSIS — G894 Chronic pain syndrome: Secondary | ICD-10-CM | POA: Diagnosis not present

## 2018-10-22 DIAGNOSIS — Z6829 Body mass index (BMI) 29.0-29.9, adult: Secondary | ICD-10-CM | POA: Diagnosis not present

## 2018-10-22 DIAGNOSIS — F419 Anxiety disorder, unspecified: Secondary | ICD-10-CM | POA: Diagnosis not present

## 2018-10-26 DIAGNOSIS — Z125 Encounter for screening for malignant neoplasm of prostate: Secondary | ICD-10-CM | POA: Diagnosis not present

## 2018-10-26 DIAGNOSIS — Z1389 Encounter for screening for other disorder: Secondary | ICD-10-CM | POA: Diagnosis not present

## 2018-10-26 DIAGNOSIS — E782 Mixed hyperlipidemia: Secondary | ICD-10-CM | POA: Diagnosis not present

## 2018-10-26 DIAGNOSIS — I10 Essential (primary) hypertension: Secondary | ICD-10-CM | POA: Diagnosis not present

## 2018-10-26 DIAGNOSIS — R109 Unspecified abdominal pain: Secondary | ICD-10-CM | POA: Diagnosis not present

## 2018-11-25 DIAGNOSIS — I10 Essential (primary) hypertension: Secondary | ICD-10-CM | POA: Diagnosis not present

## 2018-11-25 DIAGNOSIS — Z6829 Body mass index (BMI) 29.0-29.9, adult: Secondary | ICD-10-CM | POA: Diagnosis not present

## 2018-11-25 DIAGNOSIS — I429 Cardiomyopathy, unspecified: Secondary | ICD-10-CM | POA: Diagnosis not present

## 2018-11-25 DIAGNOSIS — I471 Supraventricular tachycardia: Secondary | ICD-10-CM | POA: Diagnosis not present

## 2018-11-25 DIAGNOSIS — I5042 Chronic combined systolic (congestive) and diastolic (congestive) heart failure: Secondary | ICD-10-CM | POA: Diagnosis not present

## 2018-11-25 DIAGNOSIS — G894 Chronic pain syndrome: Secondary | ICD-10-CM | POA: Diagnosis not present

## 2018-11-25 DIAGNOSIS — I5033 Acute on chronic diastolic (congestive) heart failure: Secondary | ICD-10-CM | POA: Diagnosis not present

## 2018-12-23 DIAGNOSIS — E663 Overweight: Secondary | ICD-10-CM | POA: Diagnosis not present

## 2018-12-23 DIAGNOSIS — G894 Chronic pain syndrome: Secondary | ICD-10-CM | POA: Diagnosis not present

## 2018-12-23 DIAGNOSIS — Z6829 Body mass index (BMI) 29.0-29.9, adult: Secondary | ICD-10-CM | POA: Diagnosis not present

## 2019-01-07 DIAGNOSIS — G4730 Sleep apnea, unspecified: Secondary | ICD-10-CM | POA: Diagnosis not present

## 2019-01-17 DIAGNOSIS — G894 Chronic pain syndrome: Secondary | ICD-10-CM | POA: Diagnosis not present

## 2019-01-24 DIAGNOSIS — G4733 Obstructive sleep apnea (adult) (pediatric): Secondary | ICD-10-CM | POA: Diagnosis not present

## 2019-02-14 DIAGNOSIS — G894 Chronic pain syndrome: Secondary | ICD-10-CM | POA: Diagnosis not present

## 2019-02-14 DIAGNOSIS — I10 Essential (primary) hypertension: Secondary | ICD-10-CM | POA: Diagnosis not present

## 2019-03-15 DIAGNOSIS — G894 Chronic pain syndrome: Secondary | ICD-10-CM | POA: Diagnosis not present

## 2019-04-13 DIAGNOSIS — G894 Chronic pain syndrome: Secondary | ICD-10-CM | POA: Diagnosis not present

## 2019-04-13 DIAGNOSIS — J301 Allergic rhinitis due to pollen: Secondary | ICD-10-CM | POA: Diagnosis not present

## 2019-05-11 DIAGNOSIS — G894 Chronic pain syndrome: Secondary | ICD-10-CM | POA: Diagnosis not present

## 2019-05-11 DIAGNOSIS — I10 Essential (primary) hypertension: Secondary | ICD-10-CM | POA: Diagnosis not present

## 2019-06-09 DIAGNOSIS — G894 Chronic pain syndrome: Secondary | ICD-10-CM | POA: Diagnosis not present

## 2019-06-15 DIAGNOSIS — I456 Pre-excitation syndrome: Secondary | ICD-10-CM | POA: Diagnosis not present

## 2019-06-15 DIAGNOSIS — E7849 Other hyperlipidemia: Secondary | ICD-10-CM | POA: Diagnosis not present

## 2019-06-15 DIAGNOSIS — G894 Chronic pain syndrome: Secondary | ICD-10-CM | POA: Diagnosis not present

## 2019-06-15 DIAGNOSIS — I10 Essential (primary) hypertension: Secondary | ICD-10-CM | POA: Diagnosis not present

## 2019-07-05 DIAGNOSIS — G894 Chronic pain syndrome: Secondary | ICD-10-CM | POA: Diagnosis not present

## 2019-08-08 DIAGNOSIS — G894 Chronic pain syndrome: Secondary | ICD-10-CM | POA: Diagnosis not present

## 2019-09-29 DIAGNOSIS — G894 Chronic pain syndrome: Secondary | ICD-10-CM | POA: Diagnosis not present

## 2019-11-01 DIAGNOSIS — G894 Chronic pain syndrome: Secondary | ICD-10-CM | POA: Diagnosis not present

## 2019-12-01 DIAGNOSIS — G894 Chronic pain syndrome: Secondary | ICD-10-CM | POA: Diagnosis not present

## 2019-12-29 DIAGNOSIS — J301 Allergic rhinitis due to pollen: Secondary | ICD-10-CM | POA: Diagnosis not present

## 2019-12-29 DIAGNOSIS — R609 Edema, unspecified: Secondary | ICD-10-CM | POA: Diagnosis not present

## 2019-12-29 DIAGNOSIS — I10 Essential (primary) hypertension: Secondary | ICD-10-CM | POA: Diagnosis not present

## 2019-12-29 DIAGNOSIS — G894 Chronic pain syndrome: Secondary | ICD-10-CM | POA: Diagnosis not present

## 2020-01-26 DIAGNOSIS — G894 Chronic pain syndrome: Secondary | ICD-10-CM | POA: Diagnosis not present

## 2020-02-23 DIAGNOSIS — G894 Chronic pain syndrome: Secondary | ICD-10-CM | POA: Diagnosis not present

## 2020-02-23 DIAGNOSIS — J22 Unspecified acute lower respiratory infection: Secondary | ICD-10-CM | POA: Diagnosis not present

## 2020-05-28 DIAGNOSIS — J301 Allergic rhinitis due to pollen: Secondary | ICD-10-CM | POA: Diagnosis not present

## 2020-05-28 DIAGNOSIS — G894 Chronic pain syndrome: Secondary | ICD-10-CM | POA: Diagnosis not present

## 2020-06-27 DIAGNOSIS — F419 Anxiety disorder, unspecified: Secondary | ICD-10-CM | POA: Diagnosis not present

## 2020-06-27 DIAGNOSIS — G894 Chronic pain syndrome: Secondary | ICD-10-CM | POA: Diagnosis not present

## 2020-06-27 DIAGNOSIS — Z20828 Contact with and (suspected) exposure to other viral communicable diseases: Secondary | ICD-10-CM | POA: Diagnosis not present

## 2020-07-18 ENCOUNTER — Encounter: Payer: Self-pay | Admitting: Cardiology

## 2020-07-18 ENCOUNTER — Other Ambulatory Visit: Payer: Self-pay

## 2020-07-18 ENCOUNTER — Ambulatory Visit: Payer: Medicare Other | Admitting: Cardiology

## 2020-07-18 VITALS — BP 158/98 | HR 93 | Resp 16 | Ht 72.0 in | Wt 210.0 lb

## 2020-07-18 DIAGNOSIS — R072 Precordial pain: Secondary | ICD-10-CM | POA: Diagnosis not present

## 2020-07-18 DIAGNOSIS — R9431 Abnormal electrocardiogram [ECG] [EKG]: Secondary | ICD-10-CM | POA: Diagnosis not present

## 2020-07-18 DIAGNOSIS — I517 Cardiomegaly: Secondary | ICD-10-CM | POA: Diagnosis not present

## 2020-07-18 DIAGNOSIS — I10 Essential (primary) hypertension: Secondary | ICD-10-CM

## 2020-07-18 DIAGNOSIS — R002 Palpitations: Secondary | ICD-10-CM

## 2020-07-18 DIAGNOSIS — E7800 Pure hypercholesterolemia, unspecified: Secondary | ICD-10-CM

## 2020-07-18 DIAGNOSIS — R0609 Other forms of dyspnea: Secondary | ICD-10-CM | POA: Diagnosis not present

## 2020-07-18 DIAGNOSIS — Z720 Tobacco use: Secondary | ICD-10-CM | POA: Diagnosis not present

## 2020-07-18 DIAGNOSIS — R0600 Dyspnea, unspecified: Secondary | ICD-10-CM

## 2020-07-18 MED ORDER — SPIRONOLACTONE 25 MG PO TABS: 25.0000 mg | ORAL_TABLET | ORAL | 2 refills | Status: DC

## 2020-07-18 MED ORDER — CARVEDILOL 25 MG PO TABS: 25.0000 mg | ORAL_TABLET | Freq: Two times a day (BID) | ORAL | 1 refills | Status: DC

## 2020-07-18 NOTE — Progress Notes (Signed)
Primary Physician/Referring:  Redmond School, MD  Patient ID: Timothy Barry., male    DOB: Feb 11, 1971, 49 y.o.   MRN: [id number]  Chief Complaint  Patient presents with  . Chest Pain  . New Patient (Initial Visit)   HPI:    Timothy Mutchler.  is a 49 y.o. Caucasian male with hypertension, hyperlipidemia, non-ischemic cardiomyopathy with moderate LV systolic dysfunction, history of WPW syndrome SP ablation x3 in 1995, 1998 and 2001 at [facility], presents to establish cardiac care, states that over the past 3 to 4 years he has been having frequent episodes of palpitations that do not bother him in the form of skipped beats.  However recently he has started noticing exertional dyspnea and episodes of chest discomfort in the middle of the chest.  Symptoms are brought on by exertion and relieved with rest.  He also complains of feeling extremely fatigued and weak and tired all the time.  He denies leg edema, PND or orthopnea.  He has had occasional dizziness but no syncope.  Denies symptoms of claudication.  He is also noticed that his blood pressure is not well controlled.  Past Medical History:  Diagnosis Date  . Anxiety   . Arthritis   . CAD (coronary artery disease) 2007   50% stenosis small diagonal  . Central sleep apnea   . Chronic pain    since 1998 has been on chronic pain medication  . GERD (gastroesophageal reflux disease)   . Hemorrhoids   . Hepatomegaly 10/03/2014  . HTN (hypertension)   . Palpitations   . Syncope   . Tachycardia   . Tobacco abuse   . Tubular adenoma   . Wolff-Parkinson-White (WPW) syndrome    says was cured with ablation   Past Surgical History:  Procedure Laterality Date  . ADENOIDECTOMY    . APPENDECTOMY    . ATRIAL ABLATION SURGERY    . AV NODE ABLATION    . BIOPSY N/A 05/12/2013   Procedure: BIOPSY;  Surgeon: Daneil Dolin, MD;  Location: [facility];  Service: Endoscopy;  Laterality: N/A;  . CARDIAC CATHETERIZATION N/A  05/08/2016   Procedure: Right/Left Heart Cath and Coronary Angiography;  Surgeon: Peter M Martinique, MD;  Location: [facility];  Service: Cardiovascular;  Laterality: N/A;  . CARDIAC ELECTROPHYSIOLOGY STUDY AND ABLATION     SA node ablation  . COLONOSCOPY     age 89  . COLONOSCOPY WITH PROPOFOL N/A 05/12/2013   Dr. Gala Romney- hemorrhoids, tubular adenoma  . ESOPHAGOGASTRODUODENOSCOPY (EGD) WITH PROPOFOL N/A 05/12/2013   Dr. Gala Romney- hiatal hernia, erosive reflux esophagitis  . HAND SURGERY    . HEMORRHOID BANDING  06/22/13  . POLYPECTOMY N/A 05/12/2013   Procedure: POLYPECTOMY;  Surgeon: Daneil Dolin, MD;  Location: [facility];  Service: Endoscopy;  Laterality: N/A;  . SHOULDER SURGERY     Family History  Problem Relation Age of Onset  . Coronary artery disease Father        cabg  . Colon cancer Father        age 53, chemo/surgery  . Breast cancer Mother   . Hypertension Mother     Social History   Tobacco Use  . Smoking status: Current Every Day Smoker    Packs/day: 0.50    Years: 25.00    Pack years: 12.50    Types: Cigarettes  . Smokeless tobacco: Never Used  Substance Use Topics  . Alcohol use: Yes    Alcohol/week:  2.0 standard drinks    Types: 2 Cans of beer per week    Comment: daily   Marital Status: Married  ROS  Review of Systems  Constitutional: Positive for malaise/fatigue and night sweats.  Cardiovascular: Positive for chest pain and dyspnea on exertion. Negative for leg swelling.  Respiratory: Positive for snoring.   Gastrointestinal: Negative for melena.  Psychiatric/Behavioral: Positive for depression. The patient is nervous/anxious.    Objective  Blood pressure (!) 158/98, pulse 93, resp. rate 16, height 6' (1.829 m), weight 210 lb (95.3 kg), SpO2 97 %.  Vitals with BMI 07/18/2020 07/18/2020 06/03/2016  Height - 6\' 0"  -  Weight - 210 lbs -  BMI - 32.99 -  Systolic 242 683 419  Diastolic 98 622 68  Pulse 93 101 94     Physical Exam Cardiovascular:      Rate and Rhythm: Normal rate and regular rhythm.     Pulses: Normal pulses and intact distal pulses.          Carotid pulses are on the right side with bruit and on the left side with bruit.    Heart sounds: Murmur heard.  Harsh holosystolic murmur is present with a grade of 2/6 at the lower left sternal border radiating to the lower right sternal border.  No gallop.      Comments: No leg edema, no JVD. Pulmonary:     Effort: Pulmonary effort is normal.     Breath sounds: Normal breath sounds.  Abdominal:     General: Bowel sounds are normal.     Palpations: Abdomen is soft.    Laboratory examination:   No results for input(s): NA, K, CL, CO2, GLUCOSE, BUN, CREATININE, CALCIUM, GFRNONAA, GFRAA in the last 8760 hours. CrCl cannot be calculated (Patient's most recent lab result is older than the maximum 21 days allowed.).  CMP Latest Ref Rng & Units 05/08/2016 05/07/2016 04/12/2016  Glucose 65 - 99 mg/dL 94 99 68  BUN 6 - 20 mg/dL 17 17 8   Creatinine 0.61 - 1.24 mg/dL 1.23 1.28(H) 1.33(H)  Sodium 135 - 145 mmol/L 133(L) 133(L) 134(L)  Potassium 3.5 - 5.1 mmol/L 3.1(L) 2.9(L) 3.2(L)  Chloride 101 - 111 mmol/L 95(L) 93(L) 94(L)  CO2 22 - 32 mmol/L 30 31 29   Calcium 8.9 - 10.3 mg/dL 9.7 9.6 8.9  Total Protein 6.0 - 8.3 g/dL - - -  Total Bilirubin 0.3 - 1.2 mg/dL - - -  Alkaline Phos 39 - 117 U/L - - -  AST 0 - 37 U/L - - -  ALT 0 - 53 U/L - - -   CBC Latest Ref Rng & Units 05/07/2016 04/12/2016 04/11/2016  WBC 4.0 - 10.5 K/uL 13.4(H) 12.0(H) 16.3(H)  Hemoglobin 13.0 - 17.0 g/dL 16.8 14.5 14.9  Hematocrit 39 - 52 % 49.1 45.2 43.9  Platelets 150 - 400 K/uL 377 371 417(H)   Lipid Panel     Component Value Date/Time   CHOL 187 10/29/2014 0858   TRIG 102 10/29/2014 0858   HDL 38 (L) 10/29/2014 0858   CHOLHDL 4.9 10/29/2014 0858   VLDL 20 10/29/2014 0858   LDLCALC 129 (H) 10/29/2014 0858    HEMOGLOBIN A1C Lab Results  Component Value Date   HGBA1C 5.0 10/28/2014   MPG 97  10/28/2014   TSH No results for input(s): TSH in the last 8760 hours.  External labs:   Cholesterol, total 260.000 m 10/26/2018 HDL 56.000 mg 10/26/2018 LDL 167.000 m 10/26/2018 Triglycerides 185.000 m  10/26/2018  Hemoglobin 20.300 g/d 10/26/2018 Platelets 388.000 X1 10/26/2018  Creatinine, Serum 0.960 MG/ 10/26/2018 Potassium 2.600 mm 10/26/2018 Magnesium N/D ALT (SGPT) 15.000 IU/ 10/26/2018  TSH 3.560 10/26/2018   Medications and allergies   Allergies  Allergen Reactions  . Morphine Other (See Comments)    Makes overly sleepy     Outpatient Medications Prior to Visit  Medication Sig Dispense Refill  . aspirin 325 MG tablet Take 1 tablet (325 mg total) by mouth daily. 30 tablet 11  . atorvastatin (LIPITOR) 10 MG tablet Take 1 tablet (10 mg total) by mouth at bedtime. 30 tablet 11  . cloNIDine (CATAPRES - DOSED IN MG/24 HR) 0.3 mg/24hr patch 0.3 mg once a week.    . cloNIDine (CATAPRES) 0.2 MG tablet Take 0.2 mg by mouth 2 (two) times daily as needed (BP >150/90).     . furosemide (LASIX) 40 MG tablet Take 40 mg by mouth daily.     . Garlic 10 MG CAPS Take 10 mg by mouth every evening.    . hydrochlorothiazide (HYDRODIURIL) 25 MG tablet Take 25 mg by mouth daily.  2  . oxyCODONE (ROXICODONE) 15 MG immediate release tablet Take 15 mg by mouth every 4 (four) hours as needed for pain.  0  . potassium chloride SA (KLOR-CON) 20 MEQ tablet Take 1 tablet (20 mEq total) by mouth daily. x3days 3 tablet 0  . ramipril (ALTACE) 10 MG capsule Take 1 capsule (10 mg total) by mouth daily. 30 capsule 11  . ranitidine (ZANTAC) 150 MG tablet Take 150 mg by mouth as needed for heartburn. OTC medication    . carvedilol (COREG) 6.25 MG tablet TAKE 1 AND 1/2 TABLETS BY MOUTH TWICE DAILY 270 tablet 0  . potassium chloride SA (K-DUR,KLOR-CON) 20 MEQ tablet Take 1 tablet (20 mEq total) by mouth daily. x3days (Patient taking differently: Take 20 mEq by mouth in the morning, at noon, in the evening, and at  bedtime. ) 3 tablet 0  . co-enzyme Q-10 30 MG capsule Take 100 mg by mouth daily. (Patient not taking: Reported on 07/18/2020)     No facility-administered medications prior to visit.    Radiology:   No results found.  Cardiac Studies:   Right and left heart catheterization 05/08/2016:  1. Nonobstructive CAD 2. Mild LV dysfunction- EF 45-50% 3. Elevated LVEDP 4. Normal right heart pressures  Echocardiogram 04/12/2016: Left ventricle: The cavity size was normal. Wall thickness was normal. Systolic function was moderately reduced. The estimated ejection fraction was in the range of 35% to 40%. Moderate diffuse hypokinesis with no identifiable regional variations. Features are consistent with a pseudonormal left ventricular filling pattern, with concomitant abnormal relaxation and increased filling pressure (grade 2 diastolic dysfunction). - Mitral valve: There was mild regurgitation. - Left atrium: The atrium was moderately dilated. - Right atrium: The atrium was mildly dilated.  EKG:   EKG 07/18/2020: Normal sinus rhythm at rate of 92 bpm, left atrial enlargement, normal axis.  Right bundle branch block.  Diffuse anterolateral ST-T wave changes, anterolateral ischemia versus subendocardial infarct.  Compared to 05/08/2016, ST-T wave abnormality in the anterolateral leads is new.  Assessment     ICD-10-CM   1. Precordial pain  R07.2 EKG 12-Lead    PCV ECHOCARDIOGRAM COMPLETE    PCV MYOCARDIAL PERFUSION WO LEXISCAN  2. Dyspnea on exertion  R06.00 PCV ECHOCARDIOGRAM COMPLETE    PCV MYOCARDIAL PERFUSION WO LEXISCAN  3. Nonspecific abnormal electrocardiogram (ECG) (EKG)  R94.31   4.  Cardiomegaly  I51.7 PCV MYOCARDIAL PERFUSION WO LEXISCAN  5. Palpitations  R00.2   6. Primary hypertension  I10 carvedilol (COREG) 25 MG tablet    Aldosterone + renin activity w/ ratio    PCV ECHOCARDIOGRAM COMPLETE    Basic metabolic panel    spironolactone (ALDACTONE) 25 MG tablet    potassium chloride  SA (KLOR-CON) 20 MEQ tablet    Basic metabolic panel  7. Hypercholesteremia  E78.00 Lipid Panel With LDL/HDL Ratio  8. Tobacco use  Z72.0      Medications Discontinued During This Encounter  Medication Reason  . carvedilol (COREG) 6.25 MG tablet Reorder  . potassium chloride SA (K-DUR,KLOR-CON) 20 MEQ tablet     Meds ordered this encounter  Medications  . carvedilol (COREG) 25 MG tablet    Sig: Take 1 tablet (25 mg total) by mouth 2 (two) times daily.    Dispense:  180 tablet    Refill:  1    **Patient requests 90 days supply**  . spironolactone (ALDACTONE) 25 MG tablet    Sig: Take 1 tablet (25 mg total) by mouth every morning.    Dispense:  30 tablet    Refill:  2    Recommendations:   Timothy Helbing. is a 49 y.o. Caucasian male with hypertension, hyperlipidemia, non-ischemic cardiomyopathy with moderate LV systolic dysfunction, history of WPW syndrome SP ablation x3 in 1995, 1998 and 2001 at [facility], presents to establish cardiac care, states that over the past 3 to 4 years he has been having frequent episodes of palpitations that do not bother him in the form of skipped beats.  However recently he has started noticing exertional dyspnea and episodes of chest discomfort in the middle of the chest.  Symptoms are brought on by exertion and relieved with rest.  He has difficulty in controlling his blood pressure, snoring and fatigue and daytime somnolence, anxiety and depression and ongoing tobacco use disorder.  We will increase his Coreg from 6.25mg  to 25 mg twice daily and add Aldactone 25 mg every morning, advised him to reduce his potassium supplements from 4 tablets a day to 1 tablet a day.  He may have adrenal adenoma leading to uncontrolled hypertension with hypokalemia.  Will obtain aldosterone to rennin ratio prior to starting Aldactone. As I am starting spironolactone, will repeat BMP in 2 weeks. I will obtain lipid profile testing to establish a baseline  again before initiating therapy with a statin.    Chest pain symptoms are concerning for angina will obtain exercise nuclear stress test.  He has known nonischemic cardiomyopathy with moderate LV dysfunction, will repeat echocardiogram.  He also has a very prominent systolic murmur appears to suggest a VSD or left ventricular intracavitary gradient.  We discussed regarding smoking cessation and making lifestyle changes and weight loss.  This was a greater than 80 minutes of patient face-to-face evaluation and review of his medical records.  Extremely complex presentation.   Adrian Prows, MD, [facility] 07/18/2020, 8:31 PM Office: [phone]

## 2020-07-20 DIAGNOSIS — G894 Chronic pain syndrome: Secondary | ICD-10-CM | POA: Diagnosis not present

## 2020-07-30 ENCOUNTER — Ambulatory Visit: Payer: Medicare Other

## 2020-07-30 ENCOUNTER — Other Ambulatory Visit: Payer: Self-pay

## 2020-07-30 DIAGNOSIS — I517 Cardiomegaly: Secondary | ICD-10-CM

## 2020-07-30 DIAGNOSIS — R0609 Other forms of dyspnea: Secondary | ICD-10-CM

## 2020-07-30 DIAGNOSIS — R0600 Dyspnea, unspecified: Secondary | ICD-10-CM

## 2020-07-30 DIAGNOSIS — R072 Precordial pain: Secondary | ICD-10-CM

## 2020-07-30 DIAGNOSIS — I10 Essential (primary) hypertension: Secondary | ICD-10-CM | POA: Diagnosis not present

## 2020-07-31 DIAGNOSIS — Z6827 Body mass index (BMI) 27.0-27.9, adult: Secondary | ICD-10-CM | POA: Diagnosis not present

## 2020-07-31 DIAGNOSIS — Z0001 Encounter for general adult medical examination with abnormal findings: Secondary | ICD-10-CM | POA: Diagnosis not present

## 2020-07-31 DIAGNOSIS — Z1331 Encounter for screening for depression: Secondary | ICD-10-CM | POA: Diagnosis not present

## 2020-07-31 DIAGNOSIS — E663 Overweight: Secondary | ICD-10-CM | POA: Diagnosis not present

## 2020-07-31 DIAGNOSIS — T781XXA Other adverse food reactions, not elsewhere classified, initial encounter: Secondary | ICD-10-CM | POA: Diagnosis not present

## 2020-07-31 DIAGNOSIS — K047 Periapical abscess without sinus: Secondary | ICD-10-CM | POA: Diagnosis not present

## 2020-08-22 DIAGNOSIS — G894 Chronic pain syndrome: Secondary | ICD-10-CM | POA: Diagnosis not present

## 2020-09-04 ENCOUNTER — Ambulatory Visit: Payer: Medicare Other | Admitting: Cardiology

## 2020-09-24 DIAGNOSIS — G894 Chronic pain syndrome: Secondary | ICD-10-CM | POA: Diagnosis not present

## 2020-09-28 ENCOUNTER — Ambulatory Visit: Payer: Medicare Other | Admitting: Cardiology

## 2020-10-23 DIAGNOSIS — I110 Hypertensive heart disease with heart failure: Secondary | ICD-10-CM | POA: Diagnosis not present

## 2020-10-23 DIAGNOSIS — G894 Chronic pain syndrome: Secondary | ICD-10-CM | POA: Diagnosis not present

## 2020-10-23 DIAGNOSIS — F419 Anxiety disorder, unspecified: Secondary | ICD-10-CM | POA: Diagnosis not present

## 2020-10-23 DIAGNOSIS — I429 Cardiomyopathy, unspecified: Secondary | ICD-10-CM | POA: Diagnosis not present

## 2020-10-23 DIAGNOSIS — I10 Essential (primary) hypertension: Secondary | ICD-10-CM | POA: Diagnosis not present

## 2020-10-23 DIAGNOSIS — Z6828 Body mass index (BMI) 28.0-28.9, adult: Secondary | ICD-10-CM | POA: Diagnosis not present

## 2020-10-23 DIAGNOSIS — R7989 Other specified abnormal findings of blood chemistry: Secondary | ICD-10-CM | POA: Diagnosis not present

## 2020-10-23 DIAGNOSIS — Z125 Encounter for screening for malignant neoplasm of prostate: Secondary | ICD-10-CM | POA: Diagnosis not present

## 2020-10-23 DIAGNOSIS — E7849 Other hyperlipidemia: Secondary | ICD-10-CM | POA: Diagnosis not present

## 2020-10-23 DIAGNOSIS — I5020 Unspecified systolic (congestive) heart failure: Secondary | ICD-10-CM | POA: Diagnosis not present

## 2020-10-30 ENCOUNTER — Other Ambulatory Visit: Payer: Self-pay | Admitting: Cardiology

## 2020-10-30 DIAGNOSIS — I10 Essential (primary) hypertension: Secondary | ICD-10-CM

## 2020-11-12 DIAGNOSIS — K828 Other specified diseases of gallbladder: Secondary | ICD-10-CM | POA: Diagnosis not present

## 2020-11-12 DIAGNOSIS — N281 Cyst of kidney, acquired: Secondary | ICD-10-CM | POA: Diagnosis not present

## 2020-11-12 DIAGNOSIS — R932 Abnormal findings on diagnostic imaging of liver and biliary tract: Secondary | ICD-10-CM | POA: Diagnosis not present

## 2020-11-12 DIAGNOSIS — R7989 Other specified abnormal findings of blood chemistry: Secondary | ICD-10-CM | POA: Diagnosis not present

## 2020-11-12 DIAGNOSIS — K7689 Other specified diseases of liver: Secondary | ICD-10-CM | POA: Diagnosis not present

## 2020-11-20 DIAGNOSIS — G894 Chronic pain syndrome: Secondary | ICD-10-CM | POA: Diagnosis not present

## 2020-11-20 DIAGNOSIS — R052 Subacute cough: Secondary | ICD-10-CM | POA: Diagnosis not present

## 2020-11-21 ENCOUNTER — Encounter: Payer: Self-pay | Admitting: Internal Medicine

## 2020-12-18 ENCOUNTER — Telehealth: Payer: Self-pay | Admitting: *Deleted

## 2020-12-18 ENCOUNTER — Encounter: Payer: Self-pay | Admitting: Internal Medicine

## 2020-12-18 ENCOUNTER — Emergency Department (HOSPITAL_COMMUNITY): Payer: Medicare Other

## 2020-12-18 ENCOUNTER — Other Ambulatory Visit: Payer: Self-pay

## 2020-12-18 ENCOUNTER — Inpatient Hospital Stay (HOSPITAL_COMMUNITY)
Admission: EM | Admit: 2020-12-18 | Discharge: 2020-12-20 | DRG: 304 | Disposition: A | Discharge status: Home / Self Care | Payer: Medicare Other | Attending: Family Medicine | Admitting: Family Medicine

## 2020-12-18 ENCOUNTER — Encounter (HOSPITAL_COMMUNITY): Payer: Self-pay | Admitting: Emergency Medicine

## 2020-12-18 ENCOUNTER — Telehealth (INDEPENDENT_AMBULATORY_CARE_PROVIDER_SITE_OTHER): Payer: Medicare Other | Admitting: Internal Medicine

## 2020-12-18 ENCOUNTER — Telehealth: Payer: Self-pay | Admitting: Internal Medicine

## 2020-12-18 DIAGNOSIS — R1310 Dysphagia, unspecified: Secondary | ICD-10-CM

## 2020-12-18 DIAGNOSIS — R079 Chest pain, unspecified: Secondary | ICD-10-CM | POA: Diagnosis present

## 2020-12-18 DIAGNOSIS — I4510 Unspecified right bundle-branch block: Secondary | ICD-10-CM | POA: Diagnosis present

## 2020-12-18 DIAGNOSIS — R1314 Dysphagia, pharyngoesophageal phase: Secondary | ICD-10-CM | POA: Diagnosis present

## 2020-12-18 DIAGNOSIS — Z9049 Acquired absence of other specified parts of digestive tract: Secondary | ICD-10-CM

## 2020-12-18 DIAGNOSIS — Z720 Tobacco use: Secondary | ICD-10-CM | POA: Diagnosis present

## 2020-12-18 DIAGNOSIS — I5042 Chronic combined systolic (congestive) and diastolic (congestive) heart failure: Secondary | ICD-10-CM | POA: Diagnosis present

## 2020-12-18 DIAGNOSIS — E785 Hyperlipidemia, unspecified: Secondary | ICD-10-CM | POA: Diagnosis present

## 2020-12-18 DIAGNOSIS — K760 Fatty (change of) liver, not elsewhere classified: Secondary | ICD-10-CM | POA: Diagnosis present

## 2020-12-18 DIAGNOSIS — K219 Gastro-esophageal reflux disease without esophagitis: Secondary | ICD-10-CM

## 2020-12-18 DIAGNOSIS — R531 Weakness: Secondary | ICD-10-CM | POA: Diagnosis not present

## 2020-12-18 DIAGNOSIS — I25119 Atherosclerotic heart disease of native coronary artery with unspecified angina pectoris: Secondary | ICD-10-CM | POA: Diagnosis present

## 2020-12-18 DIAGNOSIS — F17210 Nicotine dependence, cigarettes, uncomplicated: Secondary | ICD-10-CM | POA: Diagnosis present

## 2020-12-18 DIAGNOSIS — I208 Other forms of angina pectoris: Secondary | ICD-10-CM

## 2020-12-18 DIAGNOSIS — K2270 Barrett's esophagus without dysplasia: Secondary | ICD-10-CM | POA: Diagnosis present

## 2020-12-18 DIAGNOSIS — F1010 Alcohol abuse, uncomplicated: Secondary | ICD-10-CM

## 2020-12-18 DIAGNOSIS — F10239 Alcohol dependence with withdrawal, unspecified: Secondary | ICD-10-CM | POA: Diagnosis present

## 2020-12-18 DIAGNOSIS — K254 Chronic or unspecified gastric ulcer with hemorrhage: Secondary | ICD-10-CM | POA: Diagnosis present

## 2020-12-18 DIAGNOSIS — Z8719 Personal history of other diseases of the digestive system: Secondary | ICD-10-CM

## 2020-12-18 DIAGNOSIS — R0789 Other chest pain: Secondary | ICD-10-CM | POA: Diagnosis not present

## 2020-12-18 DIAGNOSIS — I161 Hypertensive emergency: Secondary | ICD-10-CM | POA: Diagnosis not present

## 2020-12-18 DIAGNOSIS — R112 Nausea with vomiting, unspecified: Secondary | ICD-10-CM | POA: Diagnosis not present

## 2020-12-18 DIAGNOSIS — Z20822 Contact with and (suspected) exposure to covid-19: Secondary | ICD-10-CM | POA: Diagnosis present

## 2020-12-18 DIAGNOSIS — G4700 Insomnia, unspecified: Secondary | ICD-10-CM | POA: Diagnosis present

## 2020-12-18 DIAGNOSIS — Z9114 Patient's other noncompliance with medication regimen: Secondary | ICD-10-CM

## 2020-12-18 DIAGNOSIS — I110 Hypertensive heart disease with heart failure: Secondary | ICD-10-CM | POA: Diagnosis present

## 2020-12-18 DIAGNOSIS — I456 Pre-excitation syndrome: Secondary | ICD-10-CM | POA: Diagnosis present

## 2020-12-18 DIAGNOSIS — Z7982 Long term (current) use of aspirin: Secondary | ICD-10-CM

## 2020-12-18 DIAGNOSIS — K209 Esophagitis, unspecified without bleeding: Secondary | ICD-10-CM | POA: Diagnosis present

## 2020-12-18 DIAGNOSIS — I10 Essential (primary) hypertension: Secondary | ICD-10-CM

## 2020-12-18 DIAGNOSIS — R7989 Other specified abnormal findings of blood chemistry: Secondary | ICD-10-CM | POA: Insufficient documentation

## 2020-12-18 DIAGNOSIS — F411 Generalized anxiety disorder: Secondary | ICD-10-CM | POA: Diagnosis present

## 2020-12-18 DIAGNOSIS — G8929 Other chronic pain: Secondary | ICD-10-CM | POA: Diagnosis present

## 2020-12-18 DIAGNOSIS — Z885 Allergy status to narcotic agent status: Secondary | ICD-10-CM

## 2020-12-18 DIAGNOSIS — R1110 Vomiting, unspecified: Secondary | ICD-10-CM | POA: Insufficient documentation

## 2020-12-18 DIAGNOSIS — K2100 Gastro-esophageal reflux disease with esophagitis, without bleeding: Secondary | ICD-10-CM | POA: Diagnosis present

## 2020-12-18 DIAGNOSIS — K279 Peptic ulcer, site unspecified, unspecified as acute or chronic, without hemorrhage or perforation: Secondary | ICD-10-CM | POA: Diagnosis present

## 2020-12-18 DIAGNOSIS — F10939 Alcohol use, unspecified with withdrawal, unspecified: Secondary | ICD-10-CM | POA: Diagnosis present

## 2020-12-18 DIAGNOSIS — K920 Hematemesis: Secondary | ICD-10-CM

## 2020-12-18 DIAGNOSIS — F419 Anxiety disorder, unspecified: Secondary | ICD-10-CM | POA: Diagnosis present

## 2020-12-18 DIAGNOSIS — G4731 Primary central sleep apnea: Secondary | ICD-10-CM | POA: Diagnosis present

## 2020-12-18 DIAGNOSIS — E876 Hypokalemia: Secondary | ICD-10-CM | POA: Diagnosis present

## 2020-12-18 DIAGNOSIS — Z79899 Other long term (current) drug therapy: Secondary | ICD-10-CM

## 2020-12-18 HISTORY — DX: Fatty (change of) liver, not elsewhere classified: K76.0

## 2020-12-18 LAB — LIPID PANEL
Cholesterol: 278 mg/dL — ABNORMAL HIGH (ref 0–200)
HDL: >135 mg/dL (ref >40)
Triglycerides: 67 mg/dL (ref <150)
VLDL: 13 mg/dL (ref 0–40)

## 2020-12-18 LAB — DIFFERENTIAL
Abs Immature Granulocytes: 0.03 10*3/uL (ref 0.00–0.07)
Basophils Absolute: 0.1 10*3/uL (ref 0.0–0.1)
Basophils Relative: 1 %
Eosinophils Absolute: 0 10*3/uL (ref 0.0–0.5)
Eosinophils Relative: 0 %
Immature Granulocytes: 0 %
Lymphocytes Relative: 21 %
Lymphs Abs: 1.5 10*3/uL (ref 0.7–4.0)
Monocytes Absolute: 0.8 10*3/uL (ref 0.1–1.0)
Monocytes Relative: 11 %
Neutro Abs: 4.9 10*3/uL (ref 1.7–7.7)
Neutrophils Relative %: 67 %

## 2020-12-18 LAB — CBC
HCT: 51.2 % (ref 39.0–52.0)
Hemoglobin: 17.9 g/dL — ABNORMAL HIGH (ref 13.0–17.0)
MCH: 33.9 pg (ref 26.0–34.0)
MCHC: 35 g/dL (ref 30.0–36.0)
MCV: 97 fL (ref 80.0–100.0)
Platelets: 245 10*3/uL (ref 150–400)
RBC: 5.28 MIL/uL (ref 4.22–5.81)
RDW: 13 % (ref 11.5–15.5)
WBC: 7.3 10*3/uL (ref 4.0–10.5)
nRBC: 0 % (ref 0.0–0.2)

## 2020-12-18 LAB — TROPONIN I (HIGH SENSITIVITY)
Troponin I (High Sensitivity): 81 ng/L — ABNORMAL HIGH (ref <18)
Troponin I (High Sensitivity): 92 ng/L — ABNORMAL HIGH (ref <18)
Troponin I (High Sensitivity): 80 ng/L — ABNORMAL HIGH (ref <18)
Troponin I (High Sensitivity): 68 ng/L — ABNORMAL HIGH (ref <18)

## 2020-12-18 LAB — HEPATIC FUNCTION PANEL
ALT: 114 U/L — ABNORMAL HIGH (ref 0–44)
AST: 127 U/L — ABNORMAL HIGH (ref 15–41)
Albumin: 4.7 g/dL (ref 3.5–5.0)
Alkaline Phosphatase: 88 U/L (ref 38–126)
Bilirubin, Direct: 0.2 mg/dL (ref 0.0–0.2)
Indirect Bilirubin: 1.2 mg/dL — ABNORMAL HIGH (ref 0.3–0.9)
Total Bilirubin: 1.4 mg/dL — ABNORMAL HIGH (ref 0.3–1.2)
Total Protein: 8.4 g/dL — ABNORMAL HIGH (ref 6.5–8.1)

## 2020-12-18 LAB — IRON AND TIBC
Iron: 165 ug/dL (ref 45–182)
Saturation Ratios: 51 % — ABNORMAL HIGH (ref 17.9–39.5)
TIBC: 326 ug/dL (ref 250–450)
UIBC: 161 ug/dL

## 2020-12-18 LAB — BASIC METABOLIC PANEL
Anion gap: 19 — ABNORMAL HIGH (ref 5–15)
BUN: 14 mg/dL (ref 6–20)
CO2: 23 mmol/L (ref 22–32)
Calcium: 9.3 mg/dL (ref 8.9–10.3)
Chloride: 95 mmol/L — ABNORMAL LOW (ref 98–111)
Creatinine, Ser: 1.12 mg/dL (ref 0.61–1.24)
GFR, Estimated: >60 mL/min (ref >60)
Glucose, Bld: 114 mg/dL — ABNORMAL HIGH (ref 70–99)
Potassium: 2.9 mmol/L — ABNORMAL LOW (ref 3.5–5.1)
Sodium: 137 mmol/L (ref 135–145)

## 2020-12-18 LAB — PROTIME-INR
INR: 0.9 (ref 0.8–1.2)
Prothrombin Time: 11.8 seconds (ref 11.4–15.2)

## 2020-12-18 LAB — HIV ANTIBODY (ROUTINE TESTING W REFLEX): HIV Screen 4th Generation wRfx: NONREACTIVE

## 2020-12-18 LAB — LIPASE, BLOOD: Lipase: 63 U/L — ABNORMAL HIGH (ref 11–51)

## 2020-12-18 LAB — TSH: TSH: 2.644 u[IU]/mL (ref 0.350–4.500)

## 2020-12-18 LAB — RESP PANEL BY RT-PCR (FLU A&B, COVID) ARPGX2
Influenza A by PCR: NEGATIVE
Influenza B by PCR: NEGATIVE
SARS Coronavirus 2 by RT PCR: NEGATIVE

## 2020-12-18 LAB — PHOSPHORUS: Phosphorus: 2.6 mg/dL (ref 2.5–4.6)

## 2020-12-18 LAB — HEPATITIS C ANTIBODY: HCV Ab: NONREACTIVE

## 2020-12-18 LAB — HEPATITIS B SURFACE ANTIGEN: Hepatitis B Surface Ag: NONREACTIVE

## 2020-12-18 LAB — FERRITIN: Ferritin: 919 ng/mL — ABNORMAL HIGH (ref 24–336)

## 2020-12-18 LAB — ETHANOL: Alcohol, Ethyl (B): <10 mg/dL (ref <10)

## 2020-12-18 LAB — HEPATITIS A ANTIBODY, TOTAL: hep A Total Ab: NONREACTIVE

## 2020-12-18 LAB — HEPATITIS B CORE ANTIBODY, TOTAL: Hep B Core Total Ab: NONREACTIVE

## 2020-12-18 LAB — BRAIN NATRIURETIC PEPTIDE: B Natriuretic Peptide: 849 pg/mL — ABNORMAL HIGH (ref 0.0–100.0)

## 2020-12-18 LAB — MAGNESIUM: Magnesium: 1.5 mg/dL — ABNORMAL LOW (ref 1.7–2.4)

## 2020-12-18 MED ORDER — SPIRONOLACTONE 25 MG PO TABS
25.0000 mg | ORAL_TABLET | ORAL | Status: DC
  Administered 2020-12-19 – 2020-12-20 (×2): 25 mg via ORAL
  Filled 2020-12-18 (×2): qty 1

## 2020-12-18 MED ORDER — LORAZEPAM 2 MG/ML IJ SOLN
0.0000 mg | Freq: Four times a day (QID) | INTRAMUSCULAR | Status: DC
  Administered 2020-12-18: 2 mg via INTRAVENOUS
  Filled 2020-12-18: qty 1

## 2020-12-18 MED ORDER — SODIUM CHLORIDE 0.9 % IV BOLUS
1000.0000 mL | Freq: Once | INTRAVENOUS | Status: AC
  Administered 2020-12-18: 1000 mL via INTRAVENOUS

## 2020-12-18 MED ORDER — METOPROLOL TARTRATE 5 MG/5ML IV SOLN
5.0000 mg | Freq: Once | INTRAVENOUS | Status: AC
  Administered 2020-12-18: 5 mg via INTRAVENOUS
  Filled 2020-12-18: qty 5

## 2020-12-18 MED ORDER — PANTOPRAZOLE SODIUM 40 MG PO TBEC
40.0000 mg | DELAYED_RELEASE_TABLET | Freq: Two times a day (BID) | ORAL | Status: DC
  Administered 2020-12-18 – 2020-12-20 (×5): 40 mg via ORAL
  Filled 2020-12-18 (×6): qty 1

## 2020-12-18 MED ORDER — POTASSIUM CHLORIDE IN NACL 40-0.9 MEQ/L-% IV SOLN
INTRAVENOUS | Status: DC
  Filled 2020-12-18 (×3): qty 1000

## 2020-12-18 MED ORDER — LORAZEPAM 2 MG/ML IJ SOLN
0.5000 mg | Freq: Once | INTRAMUSCULAR | Status: AC
  Administered 2020-12-18: 0.5 mg via INTRAVENOUS
  Filled 2020-12-18: qty 1

## 2020-12-18 MED ORDER — SODIUM CHLORIDE 0.9 % IV SOLN: INTRAVENOUS | Status: DC

## 2020-12-18 MED ORDER — CARVEDILOL 12.5 MG PO TABS
25.0000 mg | ORAL_TABLET | Freq: Two times a day (BID) | ORAL | Status: DC
  Administered 2020-12-18 – 2020-12-20 (×4): 25 mg via ORAL
  Filled 2020-12-18 (×4): qty 2

## 2020-12-18 MED ORDER — ONDANSETRON HCL 4 MG/2ML IJ SOLN
4.0000 mg | Freq: Once | INTRAMUSCULAR | Status: AC
  Administered 2020-12-18: 4 mg via INTRAVENOUS
  Filled 2020-12-18: qty 2

## 2020-12-18 MED ORDER — HEPARIN SODIUM (PORCINE) 5000 UNIT/ML IJ SOLN
5000.0000 [IU] | Freq: Three times a day (TID) | INTRAMUSCULAR | Status: AC
  Administered 2020-12-18 – 2020-12-19 (×4): 5000 [IU] via SUBCUTANEOUS
  Filled 2020-12-18 (×4): qty 1

## 2020-12-18 MED ORDER — LORAZEPAM 2 MG/ML IJ SOLN
1.0000 mg | INTRAMUSCULAR | Status: DC | PRN
  Administered 2020-12-19: 2 mg via INTRAVENOUS
  Administered 2020-12-19: 1 mg via INTRAVENOUS
  Administered 2020-12-19 – 2020-12-20 (×2): 2 mg via INTRAVENOUS
  Filled 2020-12-18 (×4): qty 1

## 2020-12-18 MED ORDER — NICOTINE 14 MG/24HR TD PT24
14.0000 mg | MEDICATED_PATCH | Freq: Every day | TRANSDERMAL | Status: DC
  Administered 2020-12-18 – 2020-12-20 (×3): 14 mg via TRANSDERMAL
  Filled 2020-12-18 (×3): qty 1

## 2020-12-18 MED ORDER — ADULT MULTIVITAMIN W/MINERALS CH
1.0000 | ORAL_TABLET | Freq: Every day | ORAL | Status: DC
  Administered 2020-12-18 – 2020-12-20 (×3): 1 via ORAL
  Filled 2020-12-18 (×3): qty 1

## 2020-12-18 MED ORDER — LORAZEPAM 2 MG/ML IJ SOLN
1.0000 mg | Freq: Once | INTRAMUSCULAR | Status: AC
  Administered 2020-12-18: 1 mg via INTRAVENOUS
  Filled 2020-12-18: qty 1

## 2020-12-18 MED ORDER — THIAMINE HCL 100 MG/ML IJ SOLN: 100.0000 mg | Freq: Every day | INTRAMUSCULAR | Status: DC

## 2020-12-18 MED ORDER — CLONIDINE HCL 0.2 MG PO TABS
0.2000 mg | ORAL_TABLET | Freq: Two times a day (BID) | ORAL | Status: DC
  Administered 2020-12-18 – 2020-12-20 (×5): 0.2 mg via ORAL
  Filled 2020-12-18 (×5): qty 1

## 2020-12-18 MED ORDER — POTASSIUM CHLORIDE 10 MEQ/100ML IV SOLN
10.0000 meq | Freq: Once | INTRAVENOUS | Status: AC
  Administered 2020-12-18: 10 meq via INTRAVENOUS
  Filled 2020-12-18: qty 100

## 2020-12-18 MED ORDER — FOLIC ACID 1 MG PO TABS
1.0000 mg | ORAL_TABLET | Freq: Every day | ORAL | Status: DC
  Administered 2020-12-18 – 2020-12-20 (×3): 1 mg via ORAL
  Filled 2020-12-18 (×3): qty 1

## 2020-12-18 MED ORDER — THIAMINE HCL 100 MG PO TABS
100.0000 mg | ORAL_TABLET | Freq: Every day | ORAL | Status: DC
  Administered 2020-12-18 – 2020-12-20 (×3): 100 mg via ORAL
  Filled 2020-12-18 (×3): qty 1

## 2020-12-18 MED ORDER — CHLORHEXIDINE GLUCONATE CLOTH 2 % EX PADS
6.0000 | MEDICATED_PAD | Freq: Every day | CUTANEOUS | Status: DC
  Administered 2020-12-18 – 2020-12-20 (×3): 6 via TOPICAL

## 2020-12-18 MED ORDER — POTASSIUM CHLORIDE CRYS ER 20 MEQ PO TBCR
40.0000 meq | EXTENDED_RELEASE_TABLET | Freq: Once | ORAL | Status: AC
  Administered 2020-12-18: 40 meq via ORAL
  Filled 2020-12-18: qty 2

## 2020-12-18 MED ORDER — ACETAMINOPHEN 325 MG PO TABS: 650.0000 mg | ORAL_TABLET | ORAL | Status: DC | PRN

## 2020-12-18 MED ORDER — NITROGLYCERIN IN D5W 200-5 MCG/ML-% IV SOLN
0.0000 ug/min | INTRAVENOUS | Status: DC
  Administered 2020-12-18: 5 ug/min via INTRAVENOUS
  Filled 2020-12-18: qty 250

## 2020-12-18 MED ORDER — LORAZEPAM 2 MG/ML IJ SOLN: 0.0000 mg | Freq: Two times a day (BID) | INTRAMUSCULAR | Status: DC

## 2020-12-18 MED ORDER — LORAZEPAM 1 MG PO TABS: 1.0000 mg | ORAL_TABLET | ORAL | Status: DC | PRN

## 2020-12-18 MED ORDER — ATORVASTATIN CALCIUM 10 MG PO TABS
10.0000 mg | ORAL_TABLET | Freq: Every day | ORAL | Status: DC
  Administered 2020-12-18 – 2020-12-19 (×2): 10 mg via ORAL
  Filled 2020-12-18 (×2): qty 1

## 2020-12-18 MED ORDER — ONDANSETRON HCL 4 MG/2ML IJ SOLN: 4.0000 mg | Freq: Four times a day (QID) | INTRAMUSCULAR | Status: DC | PRN

## 2020-12-18 NOTE — Telephone Encounter (Signed)
Communication noted.  

## 2020-12-18 NOTE — Progress Notes (Signed)
After patient received admission antihypertensive agents and initiation of CIWA protocol/ativan; he ended up receiving nitroglycerin drip for further control of his blood pressure and extra consistent benzodiazepine continue ongoing tremulousness and agitation.  He became hypotensive slightly hypoxic and mildly somnolent.  Stepdown bed was requested medications adjusted and IV boluses provided.  Close monitoring and strict I's and O's ordered.  Barton Dubois MD [phone]

## 2020-12-18 NOTE — Progress Notes (Addendum)
Subjective: Came to the emergency room due to worsening chest pain. Very mild now in the left of his chest and his neck. About 3/10 in severity.   We again discussed his IG symptoms which he discussed with Dr. Gala Romney earlier today.  Reports since having COVID in February of last year, he has had increased mucus production and postnasal drainage.  Also feels there is an area in the right side of his neck that is different.  States he feels certain foods will get hung in this area.  Also, when waking in the morning, he feels mucus has collected in this area.  States he can push on the area and will instantly have regurgitation of mucus.  He also coughs up a lot of mucus in the mornings and this typically triggers vomiting.  Usually vomits 1 or 2 times every morning.  States he has to blow his nose to stop the vomiting.  Intermittent nosebleeds.  3 months ago, he had vomiting with red flecks after 2 or 3 episodes of emesis.  This morning, he vomited about a "1/2 cup" of burgundy colored blood.  No coffee-ground emesis.  3 total episodes of emesis this morning with one episode of hematemesis.  Last episode of emesis around 2 hours ago.  No nausea currently.  Vomiting is not preceded by abdominal pain.  He does admit to mild abdominal pain across lower abdomen and upper abdomen after episodes of vomiting that resolves after a few minutes.  No current abdominal pain.  Reports he does have pain in his right upper quadrant at times.  Unable to give me specifics but again denies postprandial symptoms.  Also denies typical acid reflux symptoms.  Overall, symptoms have been progressive over the last 6 months.  No brbpr or melena.  Denies constipation or diarrhea.  Alka seltzer and BC powders routinely for arthritis.  3-6 beer daily and sometimes a shot of vodka. Last drink alcohol 2 days ago but typically drinks daily.  While I was in the room, patient attempted getting out of his bed and walking to the  restroom, but he about fell.  Nurse was at bedside as well.  He ended up standing to use a urinal and had some lightheadedness and diaphoresis.   I assisted nurse with getting him back in bed. No increased chest pain and symptoms improved after lying in bed.   Objective: Vital signs in last 24 hours: Temp:  [98.5 F (36.9 C)] 98.5 F (36.9 C) (04/05 1120) Pulse Rate:  [91-129] 104 (04/05 1415) Resp:  [11-32] 32 (04/05 1415) BP: (150-207)/(111-145) 150/111 (04/05 1415) SpO2:  [94 %-99 %] 94 % (04/05 1415) Weight:  [95.3 kg] 95.3 kg (04/05 1121)   General:   Alert and oriented, NAD, tremulous, very unsteady on his feet, diaphoretic after standing while urinating in urinal.  Head:  Normocephalic and atraumatic. Eyes:  No icterus, sclera clear. Conjuctiva pink.  Neck:  Supple, without thyromegaly or masses.  Heart:  S1, S2 present, no murmurs noted.  Lungs: Clear to auscultation bilaterally, without wheezing, rales, or rhonchi.  Abdomen:  Bowel sounds present, soft, non-distended. Mild TTP in RUQ. No HSM or hernias noted. No rebound or guarding. No masses appreciated  Msk:  Symmetrical without gross deformities. Normal posture. Extremities:  Without edema. Neurologic:  Alert and  oriented x4;  grossly normal neurologically. Psych:  Normal mood and affect.  Intake/Output from previous day: No intake/output data recorded. Intake/Output this shift: Total I/O In: 1000 [  IV Piggyback:1000] Out: -   Lab Results: Recent Labs    12/18/20 1025  WBC 7.3  HGB 17.9*  HCT 51.2  PLT 245   BMET Recent Labs    12/18/20 1025  NA 137  K 2.9*  CL 95*  CO2 23  GLUCOSE 114*  BUN 14  CREATININE 1.12  CALCIUM 9.3   LFT Recent Labs    12/18/20 1025  PROT 8.4*  ALBUMIN 4.7  AST 127*  ALT 114*  ALKPHOS 88  BILITOT 1.4*  BILIDIR 0.2  IBILI 1.2*   PT/INR Recent Labs    12/18/20 1025  LABPROT 11.8  INR 0.9    Studies/Results: DG Chest 1 View  Result Date:  12/18/2020 CLINICAL DATA:  Chest pain. EXAM: CHEST  1 VIEW COMPARISON:  April 11, 2016. FINDINGS: The heart size and mediastinal contours are within normal limits. Both lungs are clear. No pneumothorax or pleural effusion is noted. The visualized skeletal structures are unremarkable. IMPRESSION: No active disease. Electronically Signed   By: Marijo Conception M.D.   On: 12/18/2020 12:26    Assessment: 50 year old male with history of NSAID and alcohol abuse, CAD, HTN, HLD, nonischemic cardiomyopathy with moderate LV systolic dysfunction, history of WPW s/p ablation x3, sleep apnea, GERD, reflux esophagitis, who was actually seen via virtual visit by Dr. Gala Romney today complaining of several months of intermittent nausea/vomiting, reflux symptoms, lump in his throat, and vague esophageal dysphagia with plans for Dexilant daily and arranging outpatient EGD. He is now presenting to the emergency room with chest pain radiating to left side of neck and vomiting blood x1.  Found to have blood pressure 207/133 with pulse 122 in the ED. Labs with troponin elevated at 81, hemoglobin 17.9, lipase 63, potassium 2.9, AST 127, ALT 114, total bilirubin 1.4 with indirect 1.2.  Cardiology and GI have been consulted.  EKG without acute ischemic changes.  Chest x-ray with no acute process.  Cardiology has seen patient and suspect elevated troponin is likely secondary to HTN and tachycardia.  Recommended trending troponin, follow-up echo, and follow symptoms with BP control.  Vomiting with intermittent hematemesis: 6+ month history of daily vomiting every morning which seems to be induced by cough and regurgitation of "mucus" in the setting of increased PND. Symptoms started after Covid in February 2021, but has been progressively worsening. Also reports an area in the right side of his neck which he feels collects mucus overnight.  Reports he can push on this area at times and instantly have regurgitation of mucus.  Hematemesis 3  months ago and again this morning x1. Denies melena or BRBPR.  Denies any abdominal pain prior to onset of vomiting or typical reflux symptoms.  He has been taking Alka-Seltzer and BC powders routinely and also drinks alcohol daily. Abdominal exam with mild TTP in the RUQ.   Symptoms are very vague and may be related to PND and posttussive emesis.  Symptoms could also be secondary to nocturnal GERD, gastritis, duodenitis, or PUD in the setting of NSAIDs and alcohol.  Lipase is not diagnostic for pancreatitis, and clinical picture does not fit pancreatitis.  Possible Mallory-Weiss tear contributing to hematemesis.  Query whether he may also have a Zenker's diverticulum.  Encouragingly, no anemia and BUN remaines within normal limits. We will plan for possible EGD tomorrow, PPI BID, clear liquids today as tolerated, and zofran as needed.   Dysphagia: Vague description of dysphagia with sensation of items getting hung in the right side of  his neck.  Reports he can push on this area and may regurgitate mucus.  Previously with plans for outpatient EGD.  However, we will attempt to pursue EGD while inpatient once his potassium is replaced and he is stabilized from cardiac in alcohol withdrawal perspective.  If still having significant tremors tomorrow, we can pursue esophagram.  Elevated LFTs with elevated bilirubin: AST 127, ALT 114, alk phos normal, total bilirubin 1.4, indirect bilirubin 1.2.  Notably, per review outpatient office visit note today, labs on 10/24/2020 with AST 176, ALT 202, total bilirubin 0.7. Abdominal ultrasound 11/12/2020 with distended gallbladder without gallstones or wall thickening, CBD 4 mm, increased liver parenchymal echogenicity without nodularity, spleen normal. Suspect LFT elevation is likely secondary to chronic alcohol abuse.  Elevated bilirubin may be secondary to Gilbert's syndrome as this is primarily unconjugated bilirubin.  Will go ahead and screen for hepatitis and  hemochromatosis.   Tremors: Patient is very tremulous today.  Likely experiencing alcohol withdrawal.  I notify Dr. Dyann Kief who is addressing this.   Plan: 1. Monitor H/H and for overt GI bleeding.   2.  Protonix 40 mg twice daily. 3.  Clear liquids today.  N.p.o. at midnight. 4.  Possible EGD with dilation with propofol with Dr. Laural Golden tomorrow pending cardiac stability and no significant ongoing tremors. Will need to reassess in the am.  5.  Can pursue esophagram if unable to proceed with EGD tomorrow. 6.  Zofran as needed.  Recommend scheduling Zofran if recurrent nausea. 7.  Strict avoidance of all NSAIDs moving forward. 8.  Hepatitis A antibody total, hepatitis B surface antibody, B surface antigen, hepatitis B core antibody total, hepatitis C antibody, iron panel with ferritin. 9.  Continue supportive measures.    LOS: 0 days    12/18/2020, 3:17 PM   Aliene Altes, [facility]  Gastroenterology attending attestation note: Agree with the below findings as written. The patient was presented to me by the APP (Advanced Practice Provider) I personally reviewed the medical chart and evaluated the patient. I personally evaluated and examined the patient by myself.  I agree with Mrs. Tivis Ringer Harper's H/P and assessment.  Briefly, this 50 year old male With past medical history of GERD, alcohol abuse, Wolff-Parkinson-White syndrome, hypertension, coronary artery , OSA, anxiety and arthritis, came to the hospital after being evaluated today in the GI clinic through telehealth for persistent chest discomfort.  The patient reports that for months he has presented episodes of regurgitation and heartburn along with coughing which is constantly in the morning which triggers vomiting episodes.  He also has presented some scant amount of blood with his vomiting episodes but this is not frequent.  Last episode was today in the morning which was the largest, states half a cup.   He also reports having abdominal pain in his upper abdomen.  States that he takes Alka-Seltzer and BC powder frequently for arthritis.  He drinks 6-8 beers every day and in the days he does not drink beer has shots of vodka. Was found to have reflux esophagitis on an EGD in 2014.  He was advised to take Dexilant at that time which he believes it helped but he is only currently taking famotidine as needed and does not take it on a frequent basis.  Labs upon admission showed hypokalemia of 2.9, CMP showed elevation of AST 127, ALT 114, lipase was 63, albumin was 4.7, total bilirubin was 1.4, alkaline phosphatase 88.  CBC showed increased hemoglobin of 17.9 with normal cell  lines otherwise, INR was 0.9.  Negative alcohol and normal TSH of 2.64.  Chest x-ray was within the limits.  His symptoms are somewhat erratic and it is unclear if his symptoms are related to acid reflux or if he is presenting significant vomiting, although he is presenting hypokalemia.  We will need to explore this further with an EGD, possibly tomorrow if he is a stable as he is presenting tremors which are concerning for alcohol withdrawal symptoms.  He will be kept on clear liquid diet today and n.p.o. after midnight for potential EGD.  If he is not stable for procedure, we will proceed with an esophagram tomorrow to evaluate his symptoms further.  In terms of his elevated transaminases, this could be related to alcohol intake, but will check acute viral hepatitis panel and iron stores.  Maylon Peppers, MD Gastroenterology and Hepatology [facility]

## 2020-12-18 NOTE — ED Triage Notes (Signed)
Pt c/o cp that started 2 hours ago that radiates to the left side of neck. Pt states he also threw up blood 1x this morning

## 2020-12-18 NOTE — Patient Instructions (Signed)
   Stop using aspirin and NSAIDs of all forms including Alka-Seltzer and BC powders  Resume Dexilant 60 mg daily (dispense 30 with 11 refills)  Maintain hydration.  If you cannot keep liquids down, go to the ER  As discussed, an upper endoscopy is indicated.  You may need your esophagus dilated, etc.  We will schedule you for an EGD with possible esophageal dilation in the near future.  ASA 3.  Propofol.  CBC, INR, Chem-12 today.  Stop drinking alcohol and tobacco  Further recommendations to follow.

## 2020-12-18 NOTE — ED Notes (Signed)
Patient VSS, more alert and answers questions appropriately at this time. IVF bolus continues.

## 2020-12-18 NOTE — Progress Notes (Signed)
Referring Provider:  Primary Care Physician:  Redmond School, MD  Primary GI:   Patient Location: Home   Provider Location: [place] office   Reason for Visit:    Persons present on the virtual encounter, with roles:    Total time (minutes) spent on medical discussion: ### minutes   Due to COVID-19, visit was conducted using virtual method.  Visit was requested by patient.  Virtual Visit via MyChart Video Note Due to COVID-19, visit is conducted virtually and was requested by patient.   I connected with Raeanne Barry. on 12/18/20 at  8:30 AM EDT by telephone and verified that I am speaking with the correct person using two identifiers.   I discussed the limitations, risks, security and privacy concerns of performing an evaluation and management service by telephone and the availability of in person appointments. I also discussed with the patient that there may be a patient responsible charge related to this service. The patient expressed understanding and agreed to proceed.  Chief Complaint  Patient presents with  . Emesis    Vomits mucous sometimes in mornings 4-5x/week. Vomited this morning. Occurs since having COVID 10/2019, getting worse. Starts clear then yellow mucous. Possibly looked bloody this morning but had took an orange Copywriter, advertising. Had [place] at [facility] 3 weeks ago-fatty liver, enlarged heart, ?mass/?infection in gallbladder.     History of Present Illness:  50 year old with NSAID and alcohol abuse referred for increased mucus production in his throat.  Originally, slated for a face-to-face encounter.  Pt reports vomiting this morning.  Converted to a telephone visit.  Several months worth of intermittent nausea, reflux symptoms and vague esophageal dysphagia.  States he may have a fullness in his right neck.  Some difficulty swallowing food. Ongoing alcohol use up to 6 alcoholic beverages daily.  Multiple aspirin products used on a regular basis including  Alka-Seltzer and BC powders.  No melena or rectal bleeding or hematemesis.  History of reflux esophagitis seen on 2014 EGD.  He supposed to be on Dexilant.  Only takes famotidine as needed.  Had Covid x2 last year as per his report.  Recent cardiac evaluation including a stress test and echo unrevealing per patient report.   History of WPW -status post ablation..  Recent labs from 10/24/2020 demonstrated a hemoglobin and hematocrit of 17.3 and 48.6 AST and ALT of 176 and 202,  respectively bilirubin 0.7 creatinine 1.45 Ultrasound at [facility] recently demonstrated steatosis /  distended gallbladder.  Takes hydrocodone for chronic pain.  History of multiple colonic adenomas removed 2014-overdue for surveillance examination.  History of a successful hemorrhoid banding.  Past Medical History:  Diagnosis Date  . Anxiety   . Arthritis   . CAD (coronary artery disease) 2007   50% stenosis small diagonal  . Central sleep apnea   . Chronic pain    since 1998 has been on chronic pain medication  . GERD (gastroesophageal reflux disease)   . Hemorrhoids   . Hepatomegaly 10/03/2014  . HTN (hypertension)   . Palpitations   . Syncope   . Tachycardia   . Tobacco abuse   . Tubular adenoma   . Wolff-Parkinson-White (WPW) syndrome    says was cured with ablation     Past Surgical History:  Procedure Laterality Date  . ADENOIDECTOMY    . APPENDECTOMY    . ATRIAL ABLATION SURGERY    . AV NODE ABLATION    . BIOPSY N/A 05/12/2013   Procedure: BIOPSY;  Surgeon: Daneil Dolin, MD;  Location: [facility];  Service: Endoscopy;  Laterality: N/A;  . CARDIAC CATHETERIZATION N/A 05/08/2016   Procedure: Right/Left Heart Cath and Coronary Angiography;  Surgeon: Peter M Martinique, MD;  Location: [place] [facility];  Service: Cardiovascular;  Laterality: N/A;  . CARDIAC ELECTROPHYSIOLOGY STUDY AND ABLATION     SA node ablation  . COLONOSCOPY     age 20  . COLONOSCOPY WITH PROPOFOL N/A 05/12/2013   Dr. Gala Romney-  hemorrhoids, tubular adenoma  . ESOPHAGOGASTRODUODENOSCOPY (EGD) WITH PROPOFOL N/A 05/12/2013   Dr. Gala Romney- hiatal hernia, erosive reflux esophagitis  . HAND SURGERY    . HEMORRHOID BANDING  06/22/13  . POLYPECTOMY N/A 05/12/2013   Procedure: POLYPECTOMY;  Surgeon: Daneil Dolin, MD;  Location: [facility];  Service: Endoscopy;  Laterality: N/A;  . SHOULDER SURGERY       Current Meds  Medication Sig  . acetaminophen (TYLENOL) 500 MG tablet Take 1,000 mg by mouth every 6 (six) hours as needed.  Marland Kitchen aspirin 325 MG tablet Take 1 tablet (325 mg total) by mouth daily.  . Aspirin-Salicylamide-Caffeine (BC HEADACHE POWDER PO) Take by mouth as needed.  Marland Kitchen atorvastatin (LIPITOR) 10 MG tablet Take 1 tablet (10 mg total) by mouth at bedtime.  . carvedilol (COREG) 25 MG tablet TAKE (1) TABLET BY MOUTH TWICE DAILY.  Marland Kitchen Cholecalciferol (VITAMIN D3) 25 MCG (1000 UT) CAPS Take 2 capsules by mouth daily.  . cloNIDine (CATAPRES - DOSED IN MG/24 HR) 0.3 mg/24hr patch 0.3 mg once a week.  . cloNIDine (CATAPRES) 0.2 MG tablet Take 0.2 mg by mouth 2 (two) times daily as needed (BP >150/90).   Marland Kitchen co-enzyme Q-10 30 MG capsule Take 100 mg by mouth daily.  . Famotidine (PEPCID PO) Take by mouth as needed.  . Flaxseed, Linseed, (FLAX SEED OIL PO) Take by mouth daily.  . furosemide (LASIX) 40 MG tablet Take 40 mg by mouth 2 (two) times daily.  . Garlic 10 MG CAPS Take 10 mg by mouth every evening.  . hydrochlorothiazide (HYDRODIURIL) 25 MG tablet Take 25 mg by mouth daily.  Marland Kitchen oxyCODONE (ROXICODONE) 15 MG immediate release tablet Take 15 mg by mouth every 4 (four) hours as needed for pain.  Marland Kitchen Phenyleph-CPM-DM-APAP (ALKA-SELTZER PLUS COLD & FLU PO) Take by mouth as needed.  . potassium chloride SA (KLOR-CON) 20 MEQ tablet Take 20 mEq by mouth 2 (two) times daily.  Marland Kitchen spironolactone (ALDACTONE) 25 MG tablet Take 1 tablet (25 mg total) by mouth every morning.     Family History  Problem Relation Age of Onset  . Coronary artery  disease Father        cabg  . Colon cancer Father        age 63, chemo/surgery  . Breast cancer Mother   . Hypertension Mother     Social History   Socioeconomic History  . Marital status: Married    Spouse name: Not on file  . Number of children: 2  . Years of education: Not on file  . Highest education level: Not on file  Occupational History  . Occupation: unemployed    Fish farm manager: UNEMPLOYED  Tobacco Use  . Smoking status: Current Every Day Smoker    Packs/day: 0.50    Years: 25.00    Pack years: 12.50    Types: Cigarettes  . Smokeless tobacco: Never Used  Substance and Sexual Activity  . Alcohol use: Yes    Comment: daily; 12/18/20-5 days/week, 6 drinks at  time  . Drug use: Not Currently    Types: Marijuana    Comment: hx marijuana use  . Sexual activity: Yes    Birth control/protection: None  Other Topics Concern  . Not on file  Social History Narrative  . Not on file   Social Determinants of Health   Financial Resource Strain: Not on file  Food Insecurity: Not on file  Transportation Needs: Not on file  Physical Activity: Not on file  Stress: Not on file  Social Connections: Not on file   Care everywhere reviewed: Ultrasound [facility] 2/28.  Fatty appearing liver gallbladder physiologically distended.  No mass no evidence of cirrhosis.  CBD 4 mm    Review of Systems:  As in history of present illness. Observations/Objective: No distress. Unable to perform physical exam due to telephone encounter. No video available.   Assessment and Plan:  50 year old gentleman with alcohol abuse, long-term tobacco exposure, history of erosive reflux esophagitis recent Covid infection having issues with increased mucus production in the setting of excess aspirin ingestion and no PPI therapy.  He complains of a lump in his throat and vague esophageal dysphagia.  Vomiting recently precluded face-to-face office visit.  As discussed with the patient, a telephone  encounter for this gentleman's reported complaints is suboptimal to say the least.  Increased mucus production nonspecific.  I am concerned about the lump that he perceives and esophageal dysphagia and longstanding tobacco and alcohol exposure.  History of complicated reflux esophagitis -  Currently on an inadequate treatment regimen. History of fatty liver.  Elevated transaminases.  History of colonic polyps-overdue for surveillance colonoscopy  Recommendations:  Based on today's telephone encounter, there is enough information to affirmatively recommend a diagnostic EGD in the near future at the hospital.  I discussed the risk and benefits of its with the patient.  ASA 3.  Propofol.  Stop using aspirin and NSAIDs of all forms including Alka-Seltzer and BC powders  Resume Dexilant 60 mg daily (dispense 30 with 11 refills)  Maintain hydration.  If you cannot keep liquids down, go to the ER  As discussed, an upper endoscopy is indicated.  You may need your esophagus dilated, etc.  We will schedule you for an EGD with possible esophageal dilation in the near future.  ASA 3.  Propofol.  Schedule ASAP  CBC, INR, Chem-12 today.  Stop drinking alcohol and tobacco  Will need updated colonoscopy this year.  Further recommendations to follow.      Follow Up Instructions:    I discussed the assessment and treatment plan with the patient. The patient was provided an opportunity to ask questions and all were answered. The patient agreed with the plan and demonstrated an understanding of the instructions.   The patient was advised to call back or seek an in-person evaluation if the symptoms worsen or if the condition fails to improve as anticipated.  I provided 14 minutes of face-to-face time during this MyChart Video encounter.  Bridgette Habermann, MD [facility] Gastroenterology

## 2020-12-18 NOTE — Clinical Social Work Note (Signed)
CSW attempted to complete assessment with pt in room. Pt was able to state that he lives with his wife Timothy [name]. CSW inquired about pts ability to complete ADLs independently, pt dozed off, CSW spoke again and he states that recently he has been needing assistance. Pt then falls asleep again. TOC to complete assessment when pt is more alert.

## 2020-12-18 NOTE — H&P (Addendum)
History and Physical    Timothy [name] VEH:[id number] DOB: August 04, 1971 DOA: 12/18/2020  PCP: Redmond School, MD   Patient coming from: Home  I have personally briefly reviewed patient's old medical records in [place]  Chief Complaint: Intermittent nausea/vomiting, some hematemesis and chest pain.  HPI: Timothy [name] is a 50 y.o. male with medical history significant of hypertension, gastroesophageal flux disease, tobacco abuse, prior history of Wolff-Parkinson-White syndrome, fatty liver disease, chronic combined CHF, alcohol abuse (ongoing), prior history of coronary disease (50% stenosis small diagonal); who presented to the emergency department secondary to chest discomfort, intermittent nausea/vomiting and a scant hematemesis.  Patient reports symptoms present for the last 3 to 4 days.  Worse in the last 2.  He was eventually seen by gastroenterologist as an outpatient for concerns of ongoing reflux symptoms, vomiting and feeling of food being stuck in his chest.  Plan was for outpatient endoscopic evaluation.  24 hours after that patient noticing some blood going along with the vomiting and ended experiencing sustained with chest discomfort slightly radiated to the left, lasting approximately 2 hours and with intensity of 6-7/10.  ED Course: EKG demonstrated sinus tachycardia, elevated troponin in the 80s, potassium 2.9 mild transaminitis, lipase of 63 and a chest x-ray without acute process.  Elevated blood pressure seen (systolic blood pressure 836'O and diastolic BP in the 294'T range), Case discussed with cardiology service who recommended electrolyte repletion, blood pressure control and cycle troponin.  TRH was contacted to place in the hospital for further evaluation and management of chest pain with ongoing concern for hematemesis positive.  GI was also consulted  Review of Systems: As per HPI otherwise all other systems reviewed and are negative.   Past Medical History:   Diagnosis Date  . Anxiety   . Arthritis   . CAD (coronary artery disease) 2007   50% stenosis small diagonal  . Central sleep apnea   . Chronic pain    since 1998 has been on chronic pain medication  . Fatty liver   . GERD (gastroesophageal reflux disease)   . Hemorrhoids   . Hepatomegaly 10/03/2014  . HTN (hypertension)   . Palpitations   . Syncope   . Tachycardia   . Tobacco abuse   . Tubular adenoma   . Wolff-Parkinson-White (WPW) syndrome    says was cured with ablation    Past Surgical History:  Procedure Laterality Date  . ADENOIDECTOMY    . APPENDECTOMY    . ATRIAL ABLATION SURGERY    . AV NODE ABLATION    . BIOPSY N/A 05/12/2013   Procedure: BIOPSY;  Surgeon: Daneil Dolin, MD;  Location: [facility];  Service: Endoscopy;  Laterality: N/A;  . CARDIAC CATHETERIZATION N/A 05/08/2016   Procedure: Right/Left Heart Cath and Coronary Angiography;  Surgeon: Peter M Martinique, MD;  Location: [facility];  Service: Cardiovascular;  Laterality: N/A;  . CARDIAC ELECTROPHYSIOLOGY STUDY AND ABLATION     SA node ablation  . COLONOSCOPY     age 58  . COLONOSCOPY WITH PROPOFOL N/A 05/12/2013   Dr. Gala Romney- hemorrhoids, tubular adenoma  . ESOPHAGOGASTRODUODENOSCOPY (EGD) WITH PROPOFOL N/A 05/12/2013   Dr. Gala Romney- hiatal hernia, erosive reflux esophagitis  . HAND SURGERY    . HEMORRHOID BANDING  06/22/13  . POLYPECTOMY N/A 05/12/2013   Procedure: POLYPECTOMY;  Surgeon: Daneil Dolin, MD;  Location: [facility];  Service: Endoscopy;  Laterality: N/A;  . SHOULDER SURGERY      Social History  reports that he has been smoking cigarettes. He has a 12.50 pack-year smoking history. He has never used smokeless tobacco. He reports current alcohol use. He reports previous drug use. Drug: Marijuana.  Allergies  Allergen Reactions  . Morphine Other (See Comments)    Makes overly sleepy    Family History  Problem Relation Age of Onset  . Coronary artery disease Father        cabg  . Colon  cancer Father        age 31, chemo/surgery  . Breast cancer Mother   . Hypertension Mother     Prior to Admission medications   Medication Sig Start Date End Date Taking? Authorizing Provider  acetaminophen (TYLENOL) 500 MG tablet Take 1,000 mg by mouth every 6 (six) hours as needed.    [provider]  aspirin 325 MG tablet Take 1 tablet (325 mg total) by mouth daily. 10/30/14   Rai, Vernelle Emerald, MD  Aspirin-Salicylamide-Caffeine (BC HEADACHE POWDER PO) Take by mouth as needed.    [provider]  atorvastatin (LIPITOR) 10 MG tablet Take 1 tablet (10 mg total) by mouth at bedtime. 04/18/16   Erlene Quan, PA-C  carvedilol (COREG) 25 MG tablet TAKE (1) TABLET BY MOUTH TWICE DAILY. 10/30/20   Adrian Prows, MD  Cholecalciferol (VITAMIN D3) 25 MCG (1000 UT) CAPS Take 2 capsules by mouth daily.    [provider]  cloNIDine (CATAPRES - DOSED IN MG/24 HR) 0.3 mg/24hr patch 0.3 mg once a week. 06/29/20   [provider]  cloNIDine (CATAPRES) 0.2 MG tablet Take 0.2 mg by mouth 2 (two) times daily as needed (BP >150/90).     [provider]  co-enzyme Q-10 30 MG capsule Take 100 mg by mouth daily.    [provider]  Famotidine (PEPCID PO) Take by mouth as needed.    [provider]  Flaxseed, Linseed, (FLAX SEED OIL PO) Take by mouth daily.    [provider]  furosemide (LASIX) 40 MG tablet Take 40 mg by mouth 2 (two) times daily.    [provider]  Garlic 10 MG CAPS Take 10 mg by mouth every evening.    [provider]  hydrochlorothiazide (HYDRODIURIL) 25 MG tablet Take 25 mg by mouth daily. 04/12/16   [provider]  oxyCODONE (ROXICODONE) 15 MG immediate release tablet Take 15 mg by mouth every 4 (four) hours as needed for pain. 03/12/16   [provider]  Phenyleph-CPM-DM-APAP (ALKA-SELTZER PLUS COLD & FLU PO) Take by mouth as needed.    [provider]  potassium chloride SA  (KLOR-CON) 20 MEQ tablet Take 20 mEq by mouth 2 (two) times daily. 07/18/20   Adrian Prows, MD  spironolactone (ALDACTONE) 25 MG tablet Take 1 tablet (25 mg total) by mouth every morning. 07/18/20 10/16/20  Adrian Prows, MD    Physical Exam: Vitals:   12/18/20 1330 12/18/20 1345 12/18/20 1400 12/18/20 1415  BP: (!) 180/134 (!) 173/135 (!) 160/134 (!) 150/111  Pulse: (!) 106 (!) 105 (!) 109 (!) 104  Resp: 18 14  (!) 32  Temp:      TempSrc:      SpO2: 98% 98% 99% 94%  Weight:      Height:        Constitutional: anxious tremulous and expressing mid chest discomfort. No SOB, no nausea and no vomiting currently. Vitals:   12/18/20 1330 12/18/20 1345 12/18/20 1400 12/18/20 1415  BP: (!) 180/134 (!) 173/135 Marland Kitchen)  160/134 (!) 150/111  Pulse: (!) 106 (!) 105 (!) 109 (!) 104  Resp: 18 14  (!) 32  Temp:      TempSrc:      SpO2: 98% 98% 99% 94%  Weight:      Height:       Eyes: PERRL, lids and conjunctivae normal, no icterus ENMT: Mucous membranes are moist. Posterior pharynx clear of any exudate or lesions. Neck: normal, supple, no masses, no thyromegaly, no JVD Respiratory: clear to auscultation bilaterally, no wheezing, no crackles.  No using accessory muscles. Cardiovascular: Sinus tachycardia, no rubs, no gallops, no JVD on exam. Abdomen: Some vague discomfort in his left upper quadrant/midepigastric region; positive bowel sounds, no distention, no guarding. Musculoskeletal: no cyanosis, clubbing or edema. Skin: no petechiae. Neurologic: CN 2-12 grossly intact.  Moving 4 limbs setting x2; no focal deficit. Psychiatric: Anxious and restlessness appreciated; no suicidal ideation hallucination.  Labs on Admission: I have personally reviewed following labs and imaging studies  CBC: Recent Labs  Lab 12/18/20 1025  WBC 7.3  NEUTROABS 4.9  HGB 17.9*  HCT 51.2  MCV 97.0  PLT 160    Basic Metabolic Panel: Recent Labs  Lab 12/18/20 1025  NA 137  K 2.9*  CL 95*  CO2 23  GLUCOSE 114*   BUN 14  CREATININE 1.12  CALCIUM 9.3    GFR: Estimated Creatinine Clearance: 93.5 mL/min (by C-G formula based on SCr of 1.12 mg/dL).  Liver Function Tests: Recent Labs  Lab 12/18/20 1025  AST 127*  ALT 114*  ALKPHOS 88  BILITOT 1.4*  PROT 8.4*  ALBUMIN 4.7    Urine analysis:    Component Value Date/Time   COLORURINE YELLOW 04/11/2016 1044   APPEARANCEUR CLEAR 04/11/2016 1044   LABSPEC 1.011 04/11/2016 1044   PHURINE 7.5 04/11/2016 1044   GLUCOSEU NEGATIVE 04/11/2016 1044   HGBUR NEGATIVE 04/11/2016 1044   BILIRUBINUR NEGATIVE 04/11/2016 1044   KETONESUR NEGATIVE 04/11/2016 1044   PROTEINUR NEGATIVE 04/11/2016 1044   UROBILINOGEN 0.2 10/29/2014 0937   NITRITE NEGATIVE 04/11/2016 1044   LEUKOCYTESUR NEGATIVE 04/11/2016 1044    Radiological Exams on Admission: DG Chest 1 View  Result Date: 12/18/2020 CLINICAL DATA:  Chest pain. EXAM: CHEST  1 VIEW COMPARISON:  April 11, 2016. FINDINGS: The heart size and mediastinal contours are within normal limits. Both lungs are clear. No pneumothorax or pleural effusion is noted. The visualized skeletal structures are unremarkable. IMPRESSION: No active disease. Electronically Signed   By: Marijo Conception M.D.   On: 12/18/2020 12:26    EKG: Independently reviewed.  No acute ischemic changes; sinus rhythm.  Right bundle branch block seen.  Assessment/Plan 1-chest pain -Atypical in presentation -Underlying history and risk factors present -Elevated troponins -Following cardiology recommendations will control blood pressure, cycle troponin, telemetry monitoring electrolytes repletion. -Started on PPI and GI service has been consulted to further assist with his management.  2-nausea/vomiting, hematemesis and dysphagia -Continue PPI -full liquid Diet for now. -GI service consulted will follow recommendations as he might need endoscopy and possible esophageal dilatation. -As needed antiemetics has been ordered. -follow Hgb trend,  currently stable and not requiring transfusion.   3-hypertensive crisis -In the setting lack of medication compliance in the last 2 days with ongoing nausea/vomiting -Resume home antihypertensive regimen -Follow clinical response.  4-alcohol abuse -Cessation counseling provided -Patient start on CIWA protocol -Thiamine folic acid has been ordered.  5-tobacco abuse -Cessation counseling provided -Patient has -Continue Flagyl.  6-hypokalemia -In the  setting of decreased oral intake and ongoing GI losses -Telemetry monitoring, potassium repletion initiated -Follow-up closely -Checking phosphorus magnesium level.  7-history of Wolff-Parkinson-White -Patient is carvedilol medication has been resumed  8-chronic combined CHF -no crackles on exam -no signs of fluid overload -follow Strict I's and O's  -continue b-blockers and spironolactone   DVT prophylaxis: SCDs Code Status:   Full code Family Communication:  No family at bedside Disposition Plan:   Patient is from:  Home   Anticipated DC to:  home  Anticipated DC date:  4/6>>4/7  Anticipated DC barriers: Work-up completion; upper GI evaluation and ability to tolerates PO's. Consults called:  Cardiology and gastroenterology  Admission status:  Observation, LOS < 2 midnights, telemetry   Severity of Illness: Mild to moderate; presenting with chest discomfort, uncontrolled high blood pressure and intermittent episode of nausea/hematemesis and sensation of throat discomfort in his chest.  Cardiology and gastroenterology has been consulted.  Cycle troponin, provide PPI and control vital signs.    Barton Dubois MD [facility]  How to contact the [facility] Attending or Consulting provider [address] or covering provider during after hours [place], for this patient?   1. Check the care team in [facility] and look for a) attending/consulting [facility] provider listed and b) the [facility] team listed 2. Log into [email or web address] and use [place]  universal password to access. If you do not have the password, please contact the hospital operator. 3. Locate the [facility] provider you are looking for under [facility] and page to a number that you can be directly reached. 4. If you still have difficulty reaching the provider, please page the [facility] (Director on Call) for the Hospitalists listed on amion for assistance.  12/18/2020, 3:17 PM

## 2020-12-18 NOTE — Telephone Encounter (Signed)
Pt had a Virtual OV with Dr Gala Romney this morning. Patient called to let us know that he was going to the ER.

## 2020-12-18 NOTE — Consult Note (Signed)
Cardiology Consultation:   Patient ID: Timothy [name] MRN: [id number]; DOB: 07/07/1971  Admit date: 12/18/2020 Date of Consult: 12/18/2020  PCP:  Redmond School, [place]  Cardiologist:  Dr Adrian Prows Advanced Practice Provider:  No care team member to display Electrophysiologist:  None  [id number]    Patient Profile:   Timothy [name] is a 50 y.o. male with a hx of  NICM LVEF 35-40% in 2017, mild to moderate nonobstructive CAD by 2017 cath, HTN, history of WPW with history ablation x 3 at [facility], chronic fatigue, who is being seen today for the evaluation of  at the request of chest pain, HTN, and elevated tropoinin.   History of Present Illness:   Timothy [name] 50 yo male history of NICM LVEF 35-40% in 2017, mild to moderate nonobstructive CAD by 2017 cath, HTN, history of WPW with history ablation x 3 at [facility], chronic fatigue, presents with chest pain and hematemsis  Reports chest pain x 3-4 days. LUQ abdomen, midchest and left neck dull pain lasting up to 2 hours, can be worst with position. Has felt anxious, sweaty, nauseous. Reports episode of hematemsis earlier today.   ER vitals: p 121 bp 207/133 95% RA K 2.9 Cr 1.12 BUN 14 AST 127 ALT 114 T bili 1.4 Lipase 63 INR 0.9 Trop 81--> CXR no acute process EKG SR, RBBB  2017 echo: LVEF 35-40%, grade II dd, mild MR 2017 cath: mild to moderate nonobstructive CAD, normal right heart pressures 07/2020 nuclear stress: no ischemia or scar 07/2020 echo LVEF 45-50%, grade I dd        Past Medical History:  Diagnosis Date  . Anxiety   . Arthritis   . CAD (coronary artery disease) 2007   50% stenosis small diagonal  . Central sleep apnea   . Chronic pain    since 1998 has been on chronic pain medication  . Fatty liver   . GERD (gastroesophageal reflux disease)   . Hemorrhoids   . Hepatomegaly 10/03/2014  . HTN (hypertension)   . Palpitations   . Syncope   . Tachycardia   . Tobacco abuse   .  Tubular adenoma   . Wolff-Parkinson-White (WPW) syndrome    says was cured with ablation    Past Surgical History:  Procedure Laterality Date  . ADENOIDECTOMY    . APPENDECTOMY    . ATRIAL ABLATION SURGERY    . AV NODE ABLATION    . BIOPSY N/A 05/12/2013   Procedure: BIOPSY;  Surgeon: Daneil Dolin, MD;  Location: [facility];  Service: Endoscopy;  Laterality: N/A;  . CARDIAC CATHETERIZATION N/A 05/08/2016   Procedure: Right/Left Heart Cath and Coronary Angiography;  Surgeon: Peter M Martinique, MD;  Location: [facility];  Service: Cardiovascular;  Laterality: N/A;  . CARDIAC ELECTROPHYSIOLOGY STUDY AND ABLATION     SA node ablation  . COLONOSCOPY     age 70  . COLONOSCOPY WITH PROPOFOL N/A 05/12/2013   Dr. Gala Romney- hemorrhoids, tubular adenoma  . ESOPHAGOGASTRODUODENOSCOPY (EGD) WITH PROPOFOL N/A 05/12/2013   Dr. Gala Romney- hiatal hernia, erosive reflux esophagitis  . HAND SURGERY    . HEMORRHOID BANDING  06/22/13  . POLYPECTOMY N/A 05/12/2013   Procedure: POLYPECTOMY;  Surgeon: Daneil Dolin, MD;  Location: [facility];  Service: Endoscopy;  Laterality: N/A;  . SHOULDER SURGERY       Inpatient Medications: Scheduled Meds: . potassium chloride SA  40 mEq Oral Once   Continuous  Infusions: . potassium chloride     PRN Meds:   Allergies:    Allergies  Allergen Reactions  . Morphine Other (See Comments)    Makes overly sleepy    Social History:   Social History   Socioeconomic History  . Marital status: Married    Spouse name: Not on file  . Number of children: 2  . Years of education: Not on file  . Highest education level: Not on file  Occupational History  . Occupation: unemployed    Fish farm manager: UNEMPLOYED  Tobacco Use  . Smoking status: Current Every Day Smoker    Packs/day: 0.50    Years: 25.00    Pack years: 12.50    Types: Cigarettes  . Smokeless tobacco: Never Used  Substance and Sexual Activity  . Alcohol use: Yes    Comment: daily; 12/18/20-5 days/week, 6 drinks  at time  . Drug use: Not Currently    Types: Marijuana    Comment: hx marijuana use  . Sexual activity: Yes    Birth control/protection: None  Other Topics Concern  . Not on file  Social History Narrative  . Not on file   Social Determinants of Health   Financial Resource Strain: Not on file  Food Insecurity: Not on file  Transportation Needs: Not on file  Physical Activity: Not on file  Stress: Not on file  Social Connections: Not on file  Intimate Partner Violence: Not on file    Family History:    Family History  Problem Relation Age of Onset  . Coronary artery disease Father        cabg  . Colon cancer Father        age 40, chemo/surgery  . Breast cancer Mother   . Hypertension Mother      ROS:  Please see the history of present illness.  All other ROS reviewed and negative.     Physical Exam/Data:   Vitals:   12/18/20 1330 12/18/20 1345 12/18/20 1400 12/18/20 1415  BP: (!) 180/134 (!) 173/135 (!) 160/134 (!) 150/111  Pulse: (!) 106 (!) 105 (!) 109 (!) 104  Resp: 18 14  (!) 32  Temp:      TempSrc:      SpO2: 98% 98% 99% 94%  Weight:      Height:        Intake/Output Summary (Last 24 hours) at 12/18/2020 1447 Last data filed at 12/18/2020 1352 Gross per 24 hour  Intake 1000 ml  Output --  Net 1000 ml   Last 3 Weights 12/18/2020 07/18/2020 05/23/2016  Weight (lbs) 210 lb 210 lb 211 lb  Weight (kg) 95.255 kg 95.255 kg 95.709 kg     Body mass index is 28.48 kg/m.  General:  Well nourished, well developed, in no acute distress HEENT: normal Lymph: no adenopathy Neck: no JVD Endocrine:  No thryomegaly Vascular: No carotid bruits; FA pulses 2+ bilaterally without bruits  Cardiac:  normal S1, S2; RRR; no murmur  Lungs:  clear to auscultation bilaterally, no wheezing, rhonchi or rales  Abd: soft, nontender, no hepatomegaly  Ext: no edema Musculoskeletal:  No deformities, BUE and BLE strength normal and equal Skin: warm and dry  Neuro:  CNs 2-12 intact, no  focal abnormalities noted Psych:  Normal affect     Laboratory Data:  High Sensitivity Troponin:   Recent Labs  Lab 12/18/20 1025  TROPONINIHS 81*     Chemistry Recent Labs  Lab 12/18/20 1025  NA 137  K  2.9*  CL 95*  CO2 23  GLUCOSE 114*  BUN 14  CREATININE 1.12  CALCIUM 9.3  GFRNONAA >60  ANIONGAP 19*    Recent Labs  Lab 12/18/20 1025  PROT 8.4*  ALBUMIN 4.7  AST 127*  ALT 114*  ALKPHOS 88  BILITOT 1.4*   Hematology Recent Labs  Lab 12/18/20 1025  WBC 7.3  RBC 5.28  HGB 17.9*  HCT 51.2  MCV 97.0  MCH 33.9  MCHC 35.0  RDW 13.0  PLT 245   BNPNo results for input(s): BNP, PROBNP in the last 168 hours.  DDimer No results for input(s): DDIMER in the last 168 hours.   Radiology/Studies:  DG Chest 1 View  Result Date: 12/18/2020 CLINICAL DATA:  Chest pain. EXAM: CHEST  1 VIEW COMPARISON:  April 11, 2016. FINDINGS: The heart size and mediastinal contours are within normal limits. Both lungs are clear. No pneumothorax or pleural effusion is noted. The visualized skeletal structures are unremarkable. IMPRESSION: No active disease. Electronically Signed   By: Marijo Conception M.D.   On: 12/18/2020 12:26     Assessment and Plan:   1. Chest pain - not consistent with ischemia, mild trop likely related to HTN and tachycardia - cath 2017 mild to mod disease, nuclear stress test 07/2020 no ischemia - EKG without acute ischemic changes - follow trop trend. Follow up echo - follow symptoms with bp control. With hematemesis consider GI evaluation  2. Hypertensive emergency - he reports medication compliance - jittery/tremulous, UDS is pending. HIgh EtoH intake, unclear if could be tied into some EtOH related symptoms. With hypertension, tachycardia, tremulousness concern for possible withdrawal vs use of other substance. He reports compliance with clonidine and has patch on currently.  - SBPs back in the 180s, will start NG gtt for now.    3. Hematemsis - per  primary team - reports heavy EtoH and bc powder use.   4. History of EtOH abuse - per primary team - jittery/tremulous at this time unclear if could be signs of early withdrawal or perhaps use of another substance. F/u UDS, primary team evaluation  5. Elevated liver enzymes - likely EtOH related, per primary team..  6. History of WPW - no active issues  7. History of NICM - f/u repeat echo, no evidence of fluid overload by exam.   8. Hypokalemia - replace, likely secondary to EtOH use. Add a Mg to labs For questions or updates, please contact [place] Please consult [email or web address] for contact info under    Signed, Carlyle Dolly, MD  12/18/2020 2:47 PM

## 2020-12-18 NOTE — Telephone Encounter (Signed)
FYI to Dr. Gala Romney, pt going to ED.

## 2020-12-18 NOTE — Telephone Encounter (Signed)
Pt consented to a virtual visit. 

## 2020-12-18 NOTE — ED Notes (Signed)
In room to check on patient and noted sats to be low with hypotension. Patient appears to be very drowsy. MD notified and fluid bolus started, oxygen initiated.

## 2020-12-18 NOTE — Telephone Encounter (Signed)
Timothy [name]., you are scheduled for a virtual visit with your provider today.  Just as we do with appointments in the office, we must obtain your consent to participate.  Your consent will be active for this visit and any virtual visit you may have with one of our providers in the next 365 days.  If you have a MyChart account, I can also send a copy of this consent to you electronically.  All virtual visits are billed to your insurance company just like a traditional visit in the office.  As this is a virtual visit, video technology does not allow for your provider to perform a traditional examination.  This may limit your provider's ability to fully assess your condition.  If your provider identifies any concerns that need to be evaluated in person or the need to arrange testing such as labs, EKG, etc, we will make arrangements to do so.  Although advances in technology are sophisticated, we cannot ensure that it will always work on either your end or our end.  If the connection with a video visit is poor, we may have to switch to a telephone visit.  With either a video or telephone visit, we are not always able to ensure that we have a secure connection.   I need to obtain your verbal consent now.   Are you willing to proceed with your visit today?

## 2020-12-18 NOTE — ED Notes (Signed)
Dr. Roderic Palau aware of c/o chest pain and abnormal VS.

## 2020-12-18 NOTE — ED Provider Notes (Signed)
[facility] EMERGENCY DEPARTMENT Provider Note   CSN: [id number] Arrival date & time: 12/18/20  1108     History Chief Complaint  Patient presents with  . Chest Pain    Timothy [name] is a 50 y.o. male.  Patient complains of chest pain.  Patient has a history of WPW and ablations x3.  He is also has coronary artery disease and EtOH abuse  The history is provided by the patient and medical records. No language interpreter was used.  Chest Pain Pain location:  L chest Pain quality: aching   Pain radiates to:  Does not radiate Pain severity:  Mild Onset quality:  Sudden Timing:  Intermittent Progression:  Waxing and waning Chronicity:  New Associated symptoms: no abdominal pain, no back pain, no cough, no fatigue and no headache        Past Medical History:  Diagnosis Date  . Anxiety   . Arthritis   . CAD (coronary artery disease) 2007   50% stenosis small diagonal  . Central sleep apnea   . Chronic pain    since 1998 has been on chronic pain medication  . Fatty liver   . GERD (gastroesophageal reflux disease)   . Hemorrhoids   . Hepatomegaly 10/03/2014  . HTN (hypertension)   . Palpitations   . Syncope   . Tachycardia   . Tobacco abuse   . Tubular adenoma   . Wolff-Parkinson-White (WPW) syndrome    says was cured with ablation    Patient Active Problem List   Diagnosis Date Noted  . Chronic combined systolic (congestive) and diastolic (congestive) heart failure ([place]) 05/08/2016  . Acute CHF (congestive heart failure) ([place]) 04/18/2016  . ETOH abuse 04/18/2016  . RBBB 04/18/2016  . Cardiomyopathy ([place]) 04/18/2016  . Family history of coronary artery disease in father 04/18/2016  . Dyslipidemia 04/18/2016  . Elevated brain natriuretic peptide (BNP) level   . Acute on chronic diastolic heart failure ([place])   . Interstitial edema   . Hemoptysis 04/11/2016  . Orthopnea 04/11/2016  . Elevated troponin I level   . Stroke ([place]) 10/28/2014  . Hypertensive  emergency 10/28/2014  . Left sided numbness 10/28/2014  . Hepatomegaly 10/03/2014  . GERD (gastroesophageal reflux disease) 07/13/2013  . Hemorrhoids 07/13/2013  . Abdominal pain, chronic, right upper quadrant 05/03/2013  . Bowel habit changes 05/03/2013  . FH: colon cancer 05/03/2013  . Rectal bleeding 05/03/2013  . Hypokalemia 03/08/2013  . History of Wolff-Parkinson-White syndrome 03/08/2013  . Malignant hypertension 03/08/2013  . Polycythemia 03/08/2013  . Tobacco abuse 03/08/2013  . Chronic anxiety 03/08/2013  . Leukocytosis 03/08/2013  . Laceration of arm 03/08/2013  . Chest tightness 03/08/2013  . CAD (coronary artery disease) 08/11/2012  . Smoking 08/11/2012  . NIGHT SWEATS 10/23/2010  . ANXIETY STATE, UNSPECIFIED 12/23/2008  . Essential hypertension 12/23/2008  . SYNCOPE 12/23/2008  . Palpitations 12/23/2008  . TACHYCARDIA, HX OF 12/23/2008    Past Surgical History:  Procedure Laterality Date  . ADENOIDECTOMY    . APPENDECTOMY    . ATRIAL ABLATION SURGERY    . AV NODE ABLATION    . BIOPSY N/A 05/12/2013   Procedure: BIOPSY;  Surgeon: Daneil Dolin, MD;  Location: [facility];  Service: Endoscopy;  Laterality: N/A;  . CARDIAC CATHETERIZATION N/A 05/08/2016   Procedure: Right/Left Heart Cath and Coronary Angiography;  Surgeon: Peter M Martinique, MD;  Location: [facility];  Service: Cardiovascular;  Laterality: N/A;  . CARDIAC ELECTROPHYSIOLOGY STUDY AND ABLATION  SA node ablation  . COLONOSCOPY     age 34  . COLONOSCOPY WITH PROPOFOL N/A 05/12/2013   Dr. Gala Romney- hemorrhoids, tubular adenoma  . ESOPHAGOGASTRODUODENOSCOPY (EGD) WITH PROPOFOL N/A 05/12/2013   Dr. Gala Romney- hiatal hernia, erosive reflux esophagitis  . HAND SURGERY    . HEMORRHOID BANDING  06/22/13  . POLYPECTOMY N/A 05/12/2013   Procedure: POLYPECTOMY;  Surgeon: Daneil Dolin, MD;  Location: [facility];  Service: Endoscopy;  Laterality: N/A;  . SHOULDER SURGERY         Family History  Problem  Relation Age of Onset  . Coronary artery disease Father        cabg  . Colon cancer Father        age 30, chemo/surgery  . Breast cancer Mother   . Hypertension Mother     Social History   Tobacco Use  . Smoking status: Current Every Day Smoker    Packs/day: 0.50    Years: 25.00    Pack years: 12.50    Types: Cigarettes  . Smokeless tobacco: Never Used  Substance Use Topics  . Alcohol use: Yes    Comment: daily; 12/18/20-5 days/week, 6 drinks at time  . Drug use: Not Currently    Types: Marijuana    Comment: hx marijuana use    Home Medications Prior to Admission medications   Medication Sig Start Date End Date Taking? Authorizing Provider  acetaminophen (TYLENOL) 500 MG tablet Take 1,000 mg by mouth every 6 (six) hours as needed.    [provider]  aspirin 325 MG tablet Take 1 tablet (325 mg total) by mouth daily. 10/30/14   Rai, Vernelle Emerald, MD  Aspirin-Salicylamide-Caffeine (BC HEADACHE POWDER PO) Take by mouth as needed.    [provider]  atorvastatin (LIPITOR) 10 MG tablet Take 1 tablet (10 mg total) by mouth at bedtime. 04/18/16   Erlene Quan, PA-C  carvedilol (COREG) 25 MG tablet TAKE (1) TABLET BY MOUTH TWICE DAILY. 10/30/20   Adrian Prows, MD  Cholecalciferol (VITAMIN D3) 25 MCG (1000 UT) CAPS Take 2 capsules by mouth daily.    [provider]  cloNIDine (CATAPRES - DOSED IN MG/24 HR) 0.3 mg/24hr patch 0.3 mg once a week. 06/29/20   [provider]  cloNIDine (CATAPRES) 0.2 MG tablet Take 0.2 mg by mouth 2 (two) times daily as needed (BP >150/90).     [provider]  co-enzyme Q-10 30 MG capsule Take 100 mg by mouth daily.    [provider]  Famotidine (PEPCID PO) Take by mouth as needed.    [provider]  Flaxseed, Linseed, (FLAX SEED OIL PO) Take by mouth daily.    [provider]  furosemide (LASIX) 40 MG tablet Take 40 mg by mouth 2 (two) times daily.    [provider]  Garlic 10  MG CAPS Take 10 mg by mouth every evening.    [provider]  hydrochlorothiazide (HYDRODIURIL) 25 MG tablet Take 25 mg by mouth daily. 04/12/16   [provider]  oxyCODONE (ROXICODONE) 15 MG immediate release tablet Take 15 mg by mouth every 4 (four) hours as needed for pain. 03/12/16   [provider]  Phenyleph-CPM-DM-APAP (ALKA-SELTZER PLUS COLD & FLU PO) Take by mouth as needed.    [provider]  potassium chloride SA (KLOR-CON) 20 MEQ tablet Take 20 mEq by mouth 2 (two) times daily. 07/18/20   Adrian Prows, MD  spironolactone (ALDACTONE) 25 MG tablet Take 1  tablet (25 mg total) by mouth every morning. 07/18/20 10/16/20  Adrian Prows, MD    Allergies    Morphine  Review of Systems   Review of Systems  Constitutional: Negative for appetite change and fatigue.  HENT: Negative for congestion, ear discharge and sinus pressure.   Eyes: Negative for discharge.  Respiratory: Negative for cough.   Cardiovascular: Positive for chest pain.  Gastrointestinal: Negative for abdominal pain and diarrhea.  Genitourinary: Negative for frequency and hematuria.  Musculoskeletal: Negative for back pain.  Skin: Negative for rash.  Neurological: Negative for seizures and headaches.  Psychiatric/Behavioral: Negative for hallucinations.    Physical Exam Updated Vital Signs BP (!) 150/111   Pulse (!) 104   Temp 98.5 F (36.9 C) (Oral)   Resp (!) 32   Ht 6' (1.829 m)   Wt 95.3 kg   SpO2 94%   BMI 28.48 kg/m   Physical Exam Vitals and nursing note reviewed.  Constitutional:      Appearance: He is well-developed.  HENT:     Head: Normocephalic.     Nose: Nose normal.  Eyes:     General: No scleral icterus.    Conjunctiva/sclera: Conjunctivae normal.  Neck:     Thyroid: No thyromegaly.  Cardiovascular:     Rate and Rhythm: Normal rate and regular rhythm.     Heart sounds: No murmur heard. No friction rub. No gallop.   Pulmonary:     Breath sounds: No  stridor. No wheezing or rales.  Chest:     Chest wall: No tenderness.  Abdominal:     General: There is no distension.     Tenderness: There is no abdominal tenderness. There is no rebound.  Musculoskeletal:        General: Normal range of motion.     Cervical back: Neck supple.  Lymphadenopathy:     Cervical: No cervical adenopathy.  Skin:    Findings: No erythema or rash.  Neurological:     Mental Status: He is alert and oriented to person, place, and time.     Motor: No abnormal muscle tone.     Coordination: Coordination normal.  Psychiatric:        Behavior: Behavior normal.     ED Results / Procedures / Treatments   Labs (all labs ordered are listed, but only abnormal results are displayed) Labs Reviewed  BASIC METABOLIC PANEL - Abnormal; Notable for the following components:      Result Value   Potassium 2.9 (*)    Chloride 95 (*)    Glucose, Bld 114 (*)    Anion gap 19 (*)    All other components within normal limits  HEPATIC FUNCTION PANEL - Abnormal; Notable for the following components:   Total Protein 8.4 (*)    AST 127 (*)    ALT 114 (*)    Total Bilirubin 1.4 (*)    Indirect Bilirubin 1.2 (*)    All other components within normal limits  LIPASE, BLOOD - Abnormal; Notable for the following components:   Lipase 63 (*)    All other components within normal limits  CBC - Abnormal; Notable for the following components:   Hemoglobin 17.9 (*)    All other components within normal limits  TROPONIN I (HIGH SENSITIVITY) - Abnormal; Notable for the following components:   Troponin I (High Sensitivity) 81 (*)    All other components within normal limits  PROTIME-INR  DIFFERENTIAL  RAPID URINE DRUG SCREEN, HOSP PERFORMED  TROPONIN  I (HIGH SENSITIVITY)    EKG None  Radiology DG Chest 1 View  Result Date: 12/18/2020 CLINICAL DATA:  Chest pain. EXAM: CHEST  1 VIEW COMPARISON:  April 11, 2016. FINDINGS: The heart size and mediastinal contours are within normal  limits. Both lungs are clear. No pneumothorax or pleural effusion is noted. The visualized skeletal structures are unremarkable. IMPRESSION: No active disease. Electronically Signed   By: Marijo Conception M.D.   On: 12/18/2020 12:26    Procedures Procedures   Medications Ordered in ED Medications  potassium chloride 10 mEq in 100 mL IVPB (has no administration in time range)  potassium chloride SA (KLOR-CON) CR tablet 40 mEq (has no administration in time range)  sodium chloride 0.9 % bolus 1,000 mL (0 mLs Intravenous Stopped 12/18/20 1352)  metoprolol tartrate (LOPRESSOR) injection 5 mg (5 mg Intravenous Given 12/18/20 1202)  LORazepam (ATIVAN) injection 0.5 mg (0.5 mg Intravenous Given 12/18/20 1203)  LORazepam (ATIVAN) injection 1 mg (1 mg Intravenous Given 12/18/20 1358)  ondansetron (ZOFRAN) injection 4 mg (4 mg Intravenous Given 12/18/20 1358)    ED Course  I have reviewed the triage vital signs and the nursing notes.  Pertinent labs & imaging results that were available during my care of the patient were reviewed by me and considered in my medical decision making (see chart for details). CRITICAL CARE Performed by: Milton Ferguson Total critical care time: 40 minutes Critical care time was exclusive of separately billable procedures and treating other patients. Critical care was necessary to treat or prevent imminent or life-threatening deterioration. Critical care was time spent personally by me on the following activities: development of treatment plan with patient and/or surrogate as well as nursing, discussions with consultants, evaluation of patient's response to treatment, examination of patient, obtaining history from patient or surrogate, ordering and performing treatments and interventions, ordering and review of laboratory studies, ordering and review of radiographic studies, pulse oximetry and re-evaluation of patient's condition.    MDM Rules/Calculators/A&P  Patient with chest  pain and elevated troponin.  He will be admitted to medicine with cardiology consult                         Final Clinical Impression(s) / ED Diagnoses Final diagnoses:  Atypical chest pain  Other forms of angina pectoris [facility])    Rx / DC Orders ED Discharge Orders    None       Milton Ferguson, MD 12/21/20 1340

## 2020-12-19 ENCOUNTER — Observation Stay (HOSPITAL_COMMUNITY): Payer: Medicare Other

## 2020-12-19 DIAGNOSIS — E785 Hyperlipidemia, unspecified: Secondary | ICD-10-CM | POA: Diagnosis present

## 2020-12-19 DIAGNOSIS — F10230 Alcohol dependence with withdrawal, uncomplicated: Secondary | ICD-10-CM

## 2020-12-19 DIAGNOSIS — Z8719 Personal history of other diseases of the digestive system: Secondary | ICD-10-CM | POA: Diagnosis not present

## 2020-12-19 DIAGNOSIS — F17210 Nicotine dependence, cigarettes, uncomplicated: Secondary | ICD-10-CM | POA: Diagnosis present

## 2020-12-19 DIAGNOSIS — Z20822 Contact with and (suspected) exposure to covid-19: Secondary | ICD-10-CM | POA: Diagnosis present

## 2020-12-19 DIAGNOSIS — I5042 Chronic combined systolic (congestive) and diastolic (congestive) heart failure: Secondary | ICD-10-CM | POA: Diagnosis present

## 2020-12-19 DIAGNOSIS — R531 Weakness: Secondary | ICD-10-CM | POA: Diagnosis present

## 2020-12-19 DIAGNOSIS — R079 Chest pain, unspecified: Secondary | ICD-10-CM | POA: Diagnosis not present

## 2020-12-19 DIAGNOSIS — F411 Generalized anxiety disorder: Secondary | ICD-10-CM | POA: Diagnosis present

## 2020-12-19 DIAGNOSIS — R1310 Dysphagia, unspecified: Secondary | ICD-10-CM | POA: Diagnosis not present

## 2020-12-19 DIAGNOSIS — Z9049 Acquired absence of other specified parts of digestive tract: Secondary | ICD-10-CM | POA: Diagnosis not present

## 2020-12-19 DIAGNOSIS — Z7982 Long term (current) use of aspirin: Secondary | ICD-10-CM | POA: Diagnosis not present

## 2020-12-19 DIAGNOSIS — K259 Gastric ulcer, unspecified as acute or chronic, without hemorrhage or perforation: Secondary | ICD-10-CM | POA: Diagnosis not present

## 2020-12-19 DIAGNOSIS — K760 Fatty (change of) liver, not elsewhere classified: Secondary | ICD-10-CM | POA: Diagnosis present

## 2020-12-19 DIAGNOSIS — R1314 Dysphagia, pharyngoesophageal phase: Secondary | ICD-10-CM | POA: Diagnosis present

## 2020-12-19 DIAGNOSIS — R130 Aphagia: Secondary | ICD-10-CM | POA: Diagnosis not present

## 2020-12-19 DIAGNOSIS — K2270 Barrett's esophagus without dysplasia: Secondary | ICD-10-CM | POA: Diagnosis not present

## 2020-12-19 DIAGNOSIS — R0789 Other chest pain: Secondary | ICD-10-CM | POA: Diagnosis not present

## 2020-12-19 DIAGNOSIS — F10239 Alcohol dependence with withdrawal, unspecified: Secondary | ICD-10-CM | POA: Diagnosis present

## 2020-12-19 DIAGNOSIS — F10939 Alcohol use, unspecified with withdrawal, unspecified: Secondary | ICD-10-CM | POA: Diagnosis present

## 2020-12-19 DIAGNOSIS — E876 Hypokalemia: Secondary | ICD-10-CM | POA: Diagnosis present

## 2020-12-19 DIAGNOSIS — K3189 Other diseases of stomach and duodenum: Secondary | ICD-10-CM | POA: Diagnosis not present

## 2020-12-19 DIAGNOSIS — I161 Hypertensive emergency: Secondary | ICD-10-CM | POA: Diagnosis present

## 2020-12-19 DIAGNOSIS — I110 Hypertensive heart disease with heart failure: Secondary | ICD-10-CM | POA: Diagnosis present

## 2020-12-19 DIAGNOSIS — I456 Pre-excitation syndrome: Secondary | ICD-10-CM | POA: Diagnosis present

## 2020-12-19 DIAGNOSIS — G4700 Insomnia, unspecified: Secondary | ICD-10-CM | POA: Diagnosis present

## 2020-12-19 DIAGNOSIS — G4731 Primary central sleep apnea: Secondary | ICD-10-CM | POA: Diagnosis present

## 2020-12-19 DIAGNOSIS — K254 Chronic or unspecified gastric ulcer with hemorrhage: Secondary | ICD-10-CM | POA: Diagnosis present

## 2020-12-19 DIAGNOSIS — F1010 Alcohol abuse, uncomplicated: Secondary | ICD-10-CM | POA: Diagnosis not present

## 2020-12-19 DIAGNOSIS — Z79899 Other long term (current) drug therapy: Secondary | ICD-10-CM | POA: Diagnosis not present

## 2020-12-19 DIAGNOSIS — K2100 Gastro-esophageal reflux disease with esophagitis, without bleeding: Secondary | ICD-10-CM | POA: Diagnosis present

## 2020-12-19 DIAGNOSIS — I4510 Unspecified right bundle-branch block: Secondary | ICD-10-CM | POA: Diagnosis present

## 2020-12-19 DIAGNOSIS — I25119 Atherosclerotic heart disease of native coronary artery with unspecified angina pectoris: Secondary | ICD-10-CM | POA: Diagnosis present

## 2020-12-19 DIAGNOSIS — G8929 Other chronic pain: Secondary | ICD-10-CM | POA: Diagnosis present

## 2020-12-19 DIAGNOSIS — I2510 Atherosclerotic heart disease of native coronary artery without angina pectoris: Secondary | ICD-10-CM | POA: Diagnosis not present

## 2020-12-19 LAB — ECHOCARDIOGRAM COMPLETE
AR max vel: 2.2 cm2
AV Area VTI: 3.02 cm2
AV Area mean vel: 1.77 cm2
AV Mean grad: 2.8 mmHg
AV Peak grad: 5 mmHg
Ao pk vel: 1.12 m/s
Area-P 1/2: 3.39 cm2
Height: 72 in
S' Lateral: 3.84 cm
Weight: 3333.36 oz

## 2020-12-19 LAB — RAPID URINE DRUG SCREEN, HOSP PERFORMED
Amphetamines: NOT DETECTED
Barbiturates: NOT DETECTED
Benzodiazepines: POSITIVE — AB
Cocaine: NOT DETECTED
Opiates: NOT DETECTED
Tetrahydrocannabinol: POSITIVE — AB

## 2020-12-19 LAB — CBC
HCT: 47.8 % (ref 39.0–52.0)
Hemoglobin: 16.4 g/dL (ref 13.0–17.0)
MCH: 33.4 pg (ref 26.0–34.0)
MCHC: 34.3 g/dL (ref 30.0–36.0)
MCV: 97.4 fL (ref 80.0–100.0)
Platelets: 203 10*3/uL (ref 150–400)
RBC: 4.91 MIL/uL (ref 4.22–5.81)
RDW: 12.7 % (ref 11.5–15.5)
WBC: 5.7 10*3/uL (ref 4.0–10.5)
nRBC: 0 % (ref 0.0–0.2)

## 2020-12-19 LAB — COMPREHENSIVE METABOLIC PANEL
ALT: 85 U/L — ABNORMAL HIGH (ref 0–44)
AST: 92 U/L — ABNORMAL HIGH (ref 15–41)
Albumin: 3.7 g/dL (ref 3.5–5.0)
Alkaline Phosphatase: 69 U/L (ref 38–126)
Anion gap: 14 (ref 5–15)
BUN: 12 mg/dL (ref 6–20)
CO2: 25 mmol/L (ref 22–32)
Calcium: 8.2 mg/dL — ABNORMAL LOW (ref 8.9–10.3)
Chloride: 99 mmol/L (ref 98–111)
Creatinine, Ser: 0.98 mg/dL (ref 0.61–1.24)
GFR, Estimated: >60 mL/min (ref >60)
Glucose, Bld: 84 mg/dL (ref 70–99)
Potassium: 3.4 mmol/L — ABNORMAL LOW (ref 3.5–5.1)
Sodium: 138 mmol/L (ref 135–145)
Total Bilirubin: 2.1 mg/dL — ABNORMAL HIGH (ref 0.3–1.2)
Total Protein: 6.6 g/dL (ref 6.5–8.1)

## 2020-12-19 LAB — MRSA PCR SCREENING: MRSA by PCR: NEGATIVE

## 2020-12-19 LAB — HEPATITIS B SURFACE ANTIBODY, QUANTITATIVE: Hep B S AB Quant (Post): 14.5 m[IU]/mL (ref >9.9)

## 2020-12-19 LAB — MAGNESIUM
Magnesium: 1.4 mg/dL — ABNORMAL LOW (ref 1.7–2.4)
Magnesium: 1.5 mg/dL — ABNORMAL LOW (ref 1.7–2.4)

## 2020-12-19 LAB — PHOSPHORUS: Phosphorus: 2.5 mg/dL (ref 2.5–4.6)

## 2020-12-19 MED ORDER — BUSPIRONE HCL 5 MG PO TABS
10.0000 mg | ORAL_TABLET | Freq: Three times a day (TID) | ORAL | Status: DC
  Administered 2020-12-19 – 2020-12-20 (×3): 10 mg via ORAL
  Filled 2020-12-19 (×3): qty 2

## 2020-12-19 MED ORDER — OXYCODONE HCL 5 MG PO TABS
10.0000 mg | ORAL_TABLET | Freq: Four times a day (QID) | ORAL | Status: DC | PRN
  Administered 2020-12-19 – 2020-12-20 (×4): 10 mg via ORAL
  Filled 2020-12-19 (×4): qty 2

## 2020-12-19 MED ORDER — MAGNESIUM SULFATE 4 GM/100ML IV SOLN
4.0000 g | Freq: Once | INTRAVENOUS | Status: AC
  Administered 2020-12-19: 4 g via INTRAVENOUS
  Filled 2020-12-19: qty 100

## 2020-12-19 MED ORDER — POTASSIUM CHLORIDE CRYS ER 20 MEQ PO TBCR: 40.0000 meq | EXTENDED_RELEASE_TABLET | ORAL | Status: DC

## 2020-12-19 MED ORDER — TRAZODONE HCL 50 MG PO TABS
150.0000 mg | ORAL_TABLET | Freq: Every day | ORAL | Status: DC
  Administered 2020-12-19: 150 mg via ORAL
  Filled 2020-12-19: qty 3

## 2020-12-19 NOTE — H&P (View-Only) (Signed)
Subjective: No abdominal pain today. Gagged this morning on phlegm but no N/V. No chest pain. BP at bedside 161/110. Last alcohol 3 days ago.   Objective: Vital signs in last 24 hours: Temp:  [98.1 F (36.7 C)-98.5 F (36.9 C)] 98.3 F (36.8 C) (04/06 0418) Pulse Rate:  [71-129] 76 (04/06 0800) Resp:  [0-32] 15 (04/06 0800) BP: (96-207)/(70-145) 161/110 (04/06 0800) SpO2:  [88 %-100 %] 99 % (04/06 0800) Weight:  [93.1 kg-95.3 kg] 94.5 kg (04/06 0412)   General:   Alert and oriented, pleasant Head:  Normocephalic and atraumatic. Eyes:  No icterus, sclera clear. Conjuctiva pink.  Abdomen:  Bowel sounds present, soft, mild TTP epigastric, non-distended. No HSM or hernias noted. No rebound or guarding. No masses appreciated  Msk:  Symmetrical without gross deformities. Normal posture. Extremities:  Without  edema. Neurologic:  Alert and  oriented x4 Psych:  Alert and cooperative. Normal mood and affect.  Intake/Output from previous day: 04/05 0701 - 04/06 0700 In: 1749.4 [I.V.:699.4; IV Piggyback:1050] Out: 625 [Urine:625] Intake/Output this shift: No intake/output data recorded.  Lab Results: Recent Labs    12/18/20 1025 12/18/20 1736  WBC 7.3 5.7  HGB 17.9* 16.4  HCT 51.2 47.8  PLT 245 203   BMET Recent Labs    12/18/20 1025 12/18/20 1927  NA 137 138  K 2.9* 3.4*  CL 95* 99  CO2 23 25  GLUCOSE 114* 84  BUN 14 12  CREATININE 1.12 0.98  CALCIUM 9.3 8.2*   LFT Recent Labs    12/18/20 1025 12/18/20 1927  PROT 8.4* 6.6  ALBUMIN 4.7 3.7  AST 127* 92*  ALT 114* 85*  ALKPHOS 88 69  BILITOT 1.4* 2.1*  BILIDIR 0.2  --   IBILI 1.2*  --    PT/INR Recent Labs    12/18/20 1025  LABPROT 11.8  INR 0.9   Hepatitis Panel Recent Labs    12/18/20 1736  HEPBSAG NON REACTIVE  HCVAB NON REACTIVE    Studies/Results: DG Chest 1 View  Result Date: 12/18/2020 CLINICAL DATA:  Chest pain. EXAM: CHEST  1 VIEW COMPARISON:  April 11, 2016. FINDINGS: The heart  size and mediastinal contours are within normal limits. Both lungs are clear. No pneumothorax or pleural effusion is noted. The visualized skeletal structures are unremarkable. IMPRESSION: No active disease. Electronically Signed   By: Marijo Conception M.D.   On: 12/18/2020 12:26    Assessment: 50 year old male with history of NSAID and alcohol abuse, CAD, HTN, HLD, nonischemic cardiomyopathy with moderate LV systolic dysfunction, history of WPW s/p ablation x3, sleep apnea, GERD, reflux esophagitis, presenting to the ED after virtual visit with Dr. Gala Romney yesterday due to intermittent N/V, dysphagia, chest pain, and reported hematemesis. Found to have hypertensive crisis in setting of medication non-compliance due to N/V. Cardiology following as well.   N/V: improved. "gagging" this morning on phlegm. No overt GI bleeding. Notably, he has been taking alka-seltzer and BC powders routinely as outpatient, drinks ETOH daily. No evidence for DTs currently. Remains hypertensive with BP 161/110 this morning, off Nitro drip,  and cardiology is following. No ischemic testing indicated. ECHO for today. Urine drug screen requested but not completed. Anticipate EGD on 4/7 after better BP control.   Elevated transaminases: improved today, initially AST 127, ALT 114, now 92 and 85 respectively. Likely related to known alcohol use, fatty liver, and alcoholic hepatitis. Hold off on imaging unless trending up and can follow as outpatient. INR normal.  Plan: Full liquids NPO after midnight Anticipate EGD on 4/7 after BP under better control Repeat HFP in am Appreciate Cardiology following Hold morning dose of Heparin in am PPI BID   Annitta Needs, PhD, ANP-BC [facility]     LOS: 0 days    12/19/2020, 9:07 AM

## 2020-12-19 NOTE — Progress Notes (Signed)
*  PRELIMINARY RESULTS* Echocardiogram 2D Echocardiogram has been performed.  Leavy Cella 12/19/2020, 2:48 PM

## 2020-12-19 NOTE — Progress Notes (Signed)
Subjective: No abdominal pain today. Gagged this morning on phlegm but no N/V. No chest pain. BP at bedside 161/110. Last alcohol 3 days ago.   Objective: Vital signs in last 24 hours: Temp:  [98.1 F (36.7 C)-98.5 F (36.9 C)] 98.3 F (36.8 C) (04/06 0418) Pulse Rate:  [71-129] 76 (04/06 0800) Resp:  [0-32] 15 (04/06 0800) BP: (96-207)/(70-145) 161/110 (04/06 0800) SpO2:  [88 %-100 %] 99 % (04/06 0800) Weight:  [93.1 kg-95.3 kg] 94.5 kg (04/06 0412)   General:   Alert and oriented, pleasant Head:  Normocephalic and atraumatic. Eyes:  No icterus, sclera clear. Conjuctiva pink.  Abdomen:  Bowel sounds present, soft, mild TTP epigastric, non-distended. No HSM or hernias noted. No rebound or guarding. No masses appreciated  Msk:  Symmetrical without gross deformities. Normal posture. Extremities:  Without  edema. Neurologic:  Alert and  oriented x4 Psych:  Alert and cooperative. Normal mood and affect.  Intake/Output from previous day: 04/05 0701 - 04/06 0700 In: 1749.4 [I.V.:699.4; IV Piggyback:1050] Out: 625 [Urine:625] Intake/Output this shift: No intake/output data recorded.  Lab Results: Recent Labs    12/18/20 1025 12/18/20 1736  WBC 7.3 5.7  HGB 17.9* 16.4  HCT 51.2 47.8  PLT 245 203   BMET Recent Labs    12/18/20 1025 12/18/20 1927  NA 137 138  K 2.9* 3.4*  CL 95* 99  CO2 23 25  GLUCOSE 114* 84  BUN 14 12  CREATININE 1.12 0.98  CALCIUM 9.3 8.2*   LFT Recent Labs    12/18/20 1025 12/18/20 1927  PROT 8.4* 6.6  ALBUMIN 4.7 3.7  AST 127* 92*  ALT 114* 85*  ALKPHOS 88 69  BILITOT 1.4* 2.1*  BILIDIR 0.2  --   IBILI 1.2*  --    PT/INR Recent Labs    12/18/20 1025  LABPROT 11.8  INR 0.9   Hepatitis Panel Recent Labs    12/18/20 1736  HEPBSAG NON REACTIVE  HCVAB NON REACTIVE    Studies/Results: DG Chest 1 View  Result Date: 12/18/2020 CLINICAL DATA:  Chest pain. EXAM: CHEST  1 VIEW COMPARISON:  April 11, 2016. FINDINGS: The heart  size and mediastinal contours are within normal limits. Both lungs are clear. No pneumothorax or pleural effusion is noted. The visualized skeletal structures are unremarkable. IMPRESSION: No active disease. Electronically Signed   By: Marijo Conception M.D.   On: 12/18/2020 12:26    Assessment: 50 year old male with history of NSAID and alcohol abuse, CAD, HTN, HLD, nonischemic cardiomyopathy with moderate LV systolic dysfunction, history of WPW s/p ablation x3, sleep apnea, GERD, reflux esophagitis, presenting to the ED after virtual visit with Dr. Gala Romney yesterday due to intermittent N/V, dysphagia, chest pain, and reported hematemesis. Found to have hypertensive crisis in setting of medication non-compliance due to N/V. Cardiology following as well.   N/V: improved. "gagging" this morning on phlegm. No overt GI bleeding. Notably, he has been taking alka-seltzer and BC powders routinely as outpatient, drinks ETOH daily. No evidence for DTs currently. Remains hypertensive with BP 161/110 this morning, off Nitro drip,  and cardiology is following. No ischemic testing indicated. ECHO for today. Urine drug screen requested but not completed. Anticipate EGD on 4/7 after better BP control.   Elevated transaminases: improved today, initially AST 127, ALT 114, now 92 and 85 respectively. Likely related to known alcohol use, fatty liver, and alcoholic hepatitis. Hold off on imaging unless trending up and can follow as outpatient. INR normal.  Plan: Full liquids NPO after midnight Anticipate EGD on 4/7 after BP under better control Repeat HFP in am Appreciate Cardiology following Hold morning dose of Heparin in am PPI BID   Annitta Needs, PhD, ANP-BC [facility]     LOS: 0 days    12/19/2020, 9:07 AM

## 2020-12-19 NOTE — Progress Notes (Signed)
Progress Note  Patient Name: Timothy [name] Date of Encounter: 12/19/2020  [facility] Cardiologist: Dr Einar Gip  Subjective   No chest pain this AM, some left sided neck pain  Inpatient Medications    Scheduled Meds: . atorvastatin  10 mg Oral QHS  . carvedilol  25 mg Oral BID WC  . Chlorhexidine Gluconate Cloth  6 each Topical Q0600  . cloNIDine  0.2 mg Oral BID  . folic acid  1 mg Oral Daily  . heparin injection (subcutaneous)  5,000 Units Subcutaneous Q8H  . multivitamin with minerals  1 tablet Oral Daily  . nicotine  14 mg Transdermal Daily  . pantoprazole  40 mg Oral BID  . spironolactone  25 mg Oral [id number]  . thiamine  100 mg Oral Daily   Or  . thiamine  100 mg Intravenous Daily   Continuous Infusions: . 0.9 % NaCl with KCl 40 mEq / L 100 mL/hr at 12/19/20 0411  . magnesium sulfate bolus IVPB    . nitroGLYCERIN Stopped (12/18/20 1744)   PRN Meds: acetaminophen, LORazepam **OR** LORazepam, ondansetron (ZOFRAN) IV, oxyCODONE   Vital Signs    Vitals:   12/19/20 0500 12/19/20 0600 12/19/20 0700 12/19/20 0800  BP: (!) 135/101 (!) 150/102  (!) 161/110  Pulse: 71 75 77 76  Resp: 12 (!) 21 15 15   Temp:      TempSrc:      SpO2: 98% 97% 100% 99%  Weight:      Height:        Intake/Output Summary (Last 24 hours) at 12/19/2020 0819 Last data filed at 12/19/2020 0400 Gross per 24 hour  Intake 1749.44 ml  Output 625 ml  Net 1124.44 ml   Last 3 Weights 12/19/2020 12/18/2020 12/18/2020  Weight (lbs) 208 lb 5.4 oz 205 lb 4 oz 210 lb  Weight (kg) 94.5 kg 93.1 kg 95.255 kg      Telemetry    NSR - Personally Reviewed  ECG    n/a - Personally Reviewed  Physical Exam   GEN: No acute distress.   Neck: No JVD Cardiac: RRR, no murmurs, rubs, or gallops.  Respiratory: Clear to auscultation bilaterally. GI: Soft, nontender, non-distended  MS: No edema; No deformity. Neuro:  Nonfocal  Psych: Normal affect   Labs    High Sensitivity Troponin:   Recent Labs  Lab  12/18/20 1025 12/18/20 1427 12/18/20 1927 12/18/20 2146  TROPONINIHS 81* 92* 80* 68*      Chemistry Recent Labs  Lab 12/18/20 1025 12/18/20 1927  NA 137 138  K 2.9* 3.4*  CL 95* 99  CO2 23 25  GLUCOSE 114* 84  BUN 14 12  CREATININE 1.12 0.98  CALCIUM 9.3 8.2*  PROT 8.4* 6.6  ALBUMIN 4.7 3.7  AST 127* 92*  ALT 114* 85*  ALKPHOS 88 69  BILITOT 1.4* 2.1*  GFRNONAA >60 >60  ANIONGAP 19* 14     Hematology Recent Labs  Lab 12/18/20 1025 12/18/20 1736  WBC 7.3 5.7  RBC 5.28 4.91  HGB 17.9* 16.4  HCT 51.2 47.8  MCV 97.0 97.4  MCH 33.9 33.4  MCHC 35.0 34.3  RDW 13.0 12.7  PLT 245 203    BNP Recent Labs  Lab 12/18/20 1500  BNP 849.0*     DDimer No results for input(s): DDIMER in the last 168 hours.   Radiology    DG Chest 1 View  Result Date: 12/18/2020 CLINICAL DATA:  Chest pain. EXAM: CHEST  1 VIEW  COMPARISON:  April 11, 2016. FINDINGS: The heart size and mediastinal contours are within normal limits. Both lungs are clear. No pneumothorax or pleural effusion is noted. The visualized skeletal structures are unremarkable. IMPRESSION: No active disease. Electronically Signed   By: Marijo Conception M.D.   On: 12/18/2020 12:26    Cardiac Studies     Patient Profile     Timothy [name] is a 50 y.o. male with a hx of  NICM LVEF 35-40% in 2017, mild to moderate nonobstructive CAD by 2017 cath, HTN, history of WPW with history ablation x 3 at [facility], chronic fatigue, who is being seen today for the evaluation of  at the request of chest pain, HTN, and elevated tropoinin.   Assessment & Plan    1. Atypical chest pain - mild trop in setting of severe HTN, tachycardia trending down - overall not consistent with ischemia - cath 2017 mild to mod CAD, 07/2020 nuclear stress no ischemia - f/u echo, no plans for ischemic testing at this time. COntinue blood pressure control, GI workup  2. Hypertensive emergency - SBPs in the 200s on admission, chest pain with mild  troponin elevation - started on NG drip transiently yesterday, now off - on coreg 25mg  bid, clonidine 0.2mg  bid, aldactone 25mg  daily. BP's did well overnight, trending back up today - UDS is still pending  3. EtOH abuse - per primary team - EtOH level was undetectable. UDS is still pending  4. Vomiting with intermittent hematemsis/Dysphagia - per GI    For questions or updates, please contact [place] Please consult [email or web address] for contact info under        Signed, Carlyle Dolly, MD  12/19/2020, 8:19 AM

## 2020-12-19 NOTE — Progress Notes (Signed)
Patient Demographics:    Timothy [name], is a 50 y.o. male, DOB - [birth year], NTI:[id number]  Admit date - 12/18/2020   Admitting Physician Cayetano Mikita Denton Brick, MD  Outpatient Primary MD for the patient is Redmond School, MD  LOS - 0   Chief Complaint  Patient presents with  . Chest Pain        Subjective:    Timothy [name] today has no fevers, no emesis,  No chest pain,   =-Wife at bedside,  tremors and anxiety noted--requiring IV lorazepam -Epigastric and GI symptoms persist  Assessment  & Plan :    Active Problems:   Chest pain   Atypical chest pain   Alcohol withdrawal (HCC)  Brief Summary:- 50 y.o. male with medical history significant of hypertension, gastroesophageal flux disease, tobacco abuse, prior history of Wolff-Parkinson-White syndrome, fatty liver disease, chronic combined CHF, alcohol abuse (ongoing), prior history of coronary disease (50% stenosis small diagonal);  admitted on 12/18/2020 with small episode of hematemesis in the setting of persistent nausea vomiting and alcohol intoxication/alcohol withdrawal -tremors and anxiety noted--requiring IV lorazepam  A/p 1) persistent emesis/hematemesis and dysphagia--- in the setting of alcohol abuse, GI consult appreciated plan is for EGD with possible dilatation on 12/20/2020 -Continue Protonix n.p.o. after midnight  2)Uncontrolled HTN--off nitro drip, BP improving as alcohol withdrawal symptoms improved -Continue clonidine, Aldactone and Coreg  3) combined systolic and diastolic dysfunction CHF-- Syndrome--repeat echo with EF of 45 to 50% with grade 1 diastolic dysfunction -Cardiology consult appreciated no plans for ischemia work-up at this time -Troponins noted -Continue Coreg and Aldactone  4) alcohol abuse alcohol withdrawal---tremors and anxiety noted--requiring IV lorazepam -, continue lorazepam per CIWA protocol continue folic  acid and thiamine -UDS with benzos and THC  5) hypokalemia/hypomagnesemia--- in the setting of vomiting and alcohol abuse -replace and recheck  6) atypical chest pain--- please see #3 above LHC 2017 with mild to moderate nonobstructive CAD -Myoview from 08/04/2020 without reversible ischemia  7)H/o WPW---history ablation x 3 at [facility]--continue Coreg  8) anxiety/Insomnia----trazodone and BuSpar as ordered  Disposition/Need for in-Hospital Stay- patient unable to be discharged at this time due to --alcohol withdrawal requiring IV lorazepam, hematemesis requiring further GI work-up including EGD with dilatation  Status is: Inpatient  Remains inpatient appropriate because:Please see above   Disposition: The patient is from: Home              Anticipated d/c is to: Home              Anticipated d/c date is: 1 day              Patient currently is not medically stable to d/c. Barriers: Not Clinically Stable-   Code Status :  -  Code Status: Full Code   Family Communication:   (patient is alert, awake and coherent)   Consults  :  Gi/card  DVT Prophylaxis  :   - SCDs  heparin injection 5,000 Units Start: 12/18/20 2200    Lab Results  Component Value Date   PLT 203 12/18/2020    Inpatient Medications  Scheduled Meds: . atorvastatin  10 mg Oral QHS  . busPIRone  10 mg Oral TID  . carvedilol  25 mg Oral BID WC  .  Chlorhexidine Gluconate Cloth  6 each Topical Q0600  . cloNIDine  0.2 mg Oral BID  . folic acid  1 mg Oral Daily  . heparin injection (subcutaneous)  5,000 Units Subcutaneous Q8H  . multivitamin with minerals  1 tablet Oral Daily  . nicotine  14 mg Transdermal Daily  . pantoprazole  40 mg Oral BID  . spironolactone  25 mg Oral BH-q7a  . thiamine  100 mg Oral Daily   Or  . thiamine  100 mg Intravenous Daily  . traZODone  150 mg Oral QHS   Continuous Infusions: . 0.9 % NaCl with KCl 40 mEq / L 50 mL/hr at 12/19/20 1537   PRN Meds:.acetaminophen, LORazepam  **OR** LORazepam, ondansetron (ZOFRAN) IV, oxyCODONE    Anti-infectives (From admission, onward)   None        Objective:   Vitals:   12/19/20 1300 12/19/20 1400 12/19/20 1500 12/19/20 1649  BP:  (!) 147/96    Pulse: 63 66 64   Resp: 13 14 17    Temp:    98.3 F (36.8 C)  TempSrc:    Oral  SpO2: 98% 98% 95%   Weight:      Height:        Wt Readings from Last 3 Encounters:  12/19/20 94.5 kg  07/18/20 95.3 kg  05/23/16 95.7 kg     Intake/Output Summary (Last 24 hours) at 12/19/2020 1756 Last data filed at 12/19/2020 1700 Gross per 24 hour  Intake 1697.11 ml  Output 1125 ml  Net 572.11 ml    Physical Exam  Gen:- Awake Alert,  In no apparent distress  HEENT:- [place].AT, No sclera icterus Neck-Supple Neck,No JVD,.  Lungs-  CTAB , fair symmetrical air movement CV- S1, S2 normal, regular  Abd-  +ve B.Sounds, Abd Soft, No tenderness,    Extremity/Skin:- No  edema, pedal pulses present  Psych-affect is anxious , oriented x3 Neuro-no new focal deficits, +ve  tremors   Data Review:   Micro Results Recent Results (from the past 240 hour(s))  Resp Panel by RT-PCR (Flu A&B, Covid) Nasopharyngeal Swab     Status: None   Collection Time: 12/18/20  4:41 PM   Specimen: Nasopharyngeal Swab; Nasopharyngeal(NP) swabs in vial transport medium  Result Value Ref Range Status   SARS Coronavirus 2 by RT PCR NEGATIVE NEGATIVE Final    Comment: (NOTE) SARS-CoV-2 target nucleic acids are NOT DETECTED.  The SARS-CoV-2 RNA is generally detectable in upper respiratory specimens during the acute phase of infection. The lowest concentration of SARS-CoV-2 viral copies this assay can detect is 138 copies/mL. A negative result does not preclude SARS-Cov-2 infection and should not be used as the sole basis for treatment or other patient management decisions. A negative result may occur with  improper specimen collection/handling, submission of specimen other than nasopharyngeal swab, presence  of viral mutation(s) within the areas targeted by this assay, and inadequate number of viral copies(<138 copies/mL). A negative result must be combined with clinical observations, patient history, and epidemiological information. The expected result is Negative.  Fact Sheet for Patients:  EntrepreneurPulse.com.au  Fact Sheet for Healthcare Providers:  IncredibleEmployment.be  This test is no t yet approved or cleared by the [place] FDA and  has been authorized for detection and/or diagnosis of SARS-CoV-2 by FDA under an Emergency Use Authorization (EUA). This EUA will remain  in effect (meaning this test can be used) for the duration of the COVID-19 declaration under Section 564(b)(1) of the Act, 21  U.S.C.section 360bbb-3(b)(1), unless the authorization is terminated  or revoked sooner.       Influenza A by PCR NEGATIVE NEGATIVE Final   Influenza B by PCR NEGATIVE NEGATIVE Final    Comment: (NOTE) The Xpert Xpress SARS-CoV-2/FLU/RSV plus assay is intended as an aid in the diagnosis of influenza from Nasopharyngeal swab specimens and should not be used as a sole basis for treatment. Nasal washings and aspirates are unacceptable for Xpert Xpress SARS-CoV-2/FLU/RSV testing.  Fact Sheet for Patients: EntrepreneurPulse.com.au  Fact Sheet for Healthcare Providers: IncredibleEmployment.be  This test is not yet approved or cleared by the [place] FDA and has been authorized for detection and/or diagnosis of SARS-CoV-2 by FDA under an Emergency Use Authorization (EUA). This EUA will remain in effect (meaning this test can be used) for the duration of the COVID-19 declaration under Section 564(b)(1) of the Act, 21 U.S.C. section 360bbb-3(b)(1), unless the authorization is terminated or revoked.  Performed at [facility], [address] [place], [place] [address]   MRSA PCR Screening     Status: None    Collection Time: 12/18/20  6:24 PM   Specimen: Nasal Mucosa; Nasopharyngeal  Result Value Ref Range Status   MRSA by PCR NEGATIVE NEGATIVE Final    Comment:        The GeneXpert MRSA Assay (FDA approved for NASAL specimens only), is one component of a comprehensive MRSA colonization surveillance program. It is not intended to diagnose MRSA infection nor to guide or monitor treatment for MRSA infections. Performed at [facility], [address] [place], [place] [address]     Radiology Reports DG Chest 1 View  Result Date: 12/18/2020 CLINICAL DATA:  Chest pain. EXAM: CHEST  1 VIEW COMPARISON:  April 11, 2016. FINDINGS: The heart size and mediastinal contours are within normal limits. Both lungs are clear. No pneumothorax or pleural effusion is noted. The visualized skeletal structures are unremarkable. IMPRESSION: No active disease. Electronically Signed   By: Marijo Conception M.D.   On: 12/18/2020 12:26   ECHOCARDIOGRAM COMPLETE  Result Date: 12/19/2020    ECHOCARDIOGRAM REPORT   Patient Name:   Timothy [name] Date of Exam: 12/19/2020 Medical Rec #:  [id number]       Height:       72.0 in Accession #:    [id number]      Weight:       208.3 lb Date of Birth:  24-Oct-1970       BSA:          2.168 m Patient Age:    33 years        BP:           116/73 mmHg Patient Gender: M               HR:           64 bpm. Exam Location:  [facility] Procedure: 2D Echo Indications:    Chest Pain R07.9  History:        Patient has prior history of Echocardiogram examinations, most                 recent 04/03/2016. CAD, Stroke, Signs/Symptoms:Chest Pain and                 Syncope; Risk Factors:Dyslipidemia and Current Smoker. History                 of Wolff-Parkinson-White syndrome, ETOH, RBBB.  Sonographer:    Leavy Cella  RDCS (AE) Referring Phys: [id number] [place]  1. Left ventricular ejection fraction, by estimation, is 45 to 50%. The left ventricle has mildly decreased function. The  left ventricle has no regional wall motion abnormalities. There is moderate left ventricular hypertrophy. Left ventricular diastolic parameters are consistent with Grade I diastolic dysfunction (impaired relaxation).  2. Right ventricular systolic function is normal. The right ventricular size is normal.  3. Left atrial size was mildly dilated.  4. The mitral valve is normal in structure. No evidence of mitral valve regurgitation. No evidence of mitral stenosis.  5. The aortic valve is tricuspid. There is mild calcification of the aortic valve. There is mild thickening of the aortic valve. Aortic valve regurgitation is not visualized. No aortic stenosis is present.  6. The inferior vena cava is normal in size with greater than 50% respiratory variability, suggesting right atrial pressure of 3 mmHg. FINDINGS  Left Ventricle: Left ventricular ejection fraction, by estimation, is 45 to 50%. The left ventricle has mildly decreased function. The left ventricle has no regional wall motion abnormalities. The left ventricular internal cavity size was normal in size. There is moderate left ventricular hypertrophy. Left ventricular diastolic parameters are consistent with Grade I diastolic dysfunction (impaired relaxation). Normal left ventricular filling pressure. Right Ventricle: The right ventricular size is normal. No increase in right ventricular wall thickness. Right ventricular systolic function is normal. Left Atrium: Left atrial size was mildly dilated. Right Atrium: Right atrial size was normal in size. Pericardium: There is no evidence of pericardial effusion. Mitral Valve: The mitral valve is normal in structure. There is mild thickening of the mitral valve leaflet(s). There is mild calcification of the mitral valve leaflet(s). Mild mitral annular calcification. No evidence of mitral valve regurgitation. No evidence of mitral valve stenosis. Tricuspid Valve: The tricuspid valve is normal in structure. Tricuspid  valve regurgitation is trivial. No evidence of tricuspid stenosis. Aortic Valve: The aortic valve is tricuspid. There is mild calcification of the aortic valve. There is mild thickening of the aortic valve. There is mild aortic valve annular calcification. Aortic valve regurgitation is not visualized. No aortic stenosis  is present. Aortic valve mean gradient measures 2.8 mmHg. Aortic valve peak gradient measures 5.0 mmHg. Aortic valve area, by VTI measures 3.02 cm. Pulmonic Valve: The pulmonic valve was not well visualized. Pulmonic valve regurgitation is not visualized. No evidence of pulmonic stenosis. Aorta: The aortic root is normal in size and structure. Pulmonary Artery: Indeterminant PASP, inadequate TR jet. Venous: The inferior vena cava is normal in size with greater than 50% respiratory variability, suggesting right atrial pressure of 3 mmHg. IAS/Shunts: No atrial level shunt detected by color flow Doppler.  LEFT VENTRICLE PLAX 2D LVIDd:         5.18 cm  Diastology LVIDs:         3.84 cm  LV e' medial:    4.47 cm/s LV PW:         1.34 cm  LV E/e' medial:  12.6 LV IVS:        1.30 cm  LV e' lateral:   5.17 cm/s LVOT diam:     2.10 cm  LV E/e' lateral: 10.9 LV SV:         61 LV SV Index:   28 LVOT Area:     3.46 cm  RIGHT VENTRICLE RV S prime:     12.30 cm/s TAPSE (M-mode): 2.5 cm LEFT ATRIUM  Index       RIGHT ATRIUM           Index LA diam:        4.30 cm 1.98 cm/m  RA Area:     16.70 cm LA Vol (A2C):   89.4 ml 41.24 ml/m RA Volume:   43.40 ml  20.02 ml/m LA Vol (A4C):   65.5 ml 30.21 ml/m LA Biplane Vol: 81.5 ml 37.59 ml/m  AORTIC VALVE AV Area (Vmax):    2.20 cm AV Area (Vmean):   1.77 cm AV Area (VTI):     3.02 cm AV Vmax:           111.69 cm/s AV Vmean:          78.564 cm/s AV VTI:            0.202 m AV Peak Grad:      5.0 mmHg AV Mean Grad:      2.8 mmHg LVOT Vmax:         70.80 cm/s LVOT Vmean:        40.100 cm/s LVOT VTI:          0.176 m LVOT/AV VTI ratio: 0.87  AORTA Ao Root  diam: 3.80 cm MITRAL VALVE MV Area (PHT): 3.39 cm     SHUNTS MV Decel Time: 224 msec     Systemic VTI:  0.18 m MV E velocity: 56.10 cm/s   Systemic Diam: 2.10 cm MV A velocity: 108.00 cm/s MV E/A ratio:  0.52 Carlyle Dolly MD Electronically signed by Carlyle Dolly MD Signature Date/Time: 12/19/2020/3:45:43 PM    Final      CBC Recent Labs  Lab 12/18/20 1025 12/18/20 1736  WBC 7.3 5.7  HGB 17.9* 16.4  HCT 51.2 47.8  PLT 245 203  MCV 97.0 97.4  MCH 33.9 33.4  MCHC 35.0 34.3  RDW 13.0 12.7  LYMPHSABS 1.5  --   MONOABS 0.8  --   EOSABS 0.0  --   BASOSABS 0.1  --     Chemistries  Recent Labs  Lab 12/18/20 1025 12/18/20 1513 12/18/20 1927 12/19/20 0645  NA 137  --  138  --   K 2.9*  --  3.4*  --   CL 95*  --  99  --   CO2 23  --  25  --   GLUCOSE 114*  --  84  --   BUN 14  --  12  --   CREATININE 1.12  --  0.98  --   CALCIUM 9.3  --  8.2*  --   MG  --  1.5* 1.4* 1.5*  AST 127*  --  92*  --   ALT 114*  --  85*  --   ALKPHOS 88  --  69  --   BILITOT 1.4*  --  2.1*  --    ------------------------------------------------------------------------------------------------------------------ Recent Labs    12/18/20 1515  CHOL 278*  HDL >135  LDLCALC NOT CALCULATED  TRIG 67  CHOLHDL NOT CALCULATED    Lab Results  Component Value Date   HGBA1C 5.0 10/28/2014   ------------------------------------------------------------------------------------------------------------------ Recent Labs    12/18/20 1500  TSH 2.644   ------------------------------------------------------------------------------------------------------------------ Recent Labs    12/18/20 1736  FERRITIN 919*  TIBC 326  IRON 165    Coagulation profile Recent Labs  Lab 12/18/20 1025  INR 0.9    No results for input(s): DDIMER in the last 72 hours.  Cardiac Enzymes No results for input(s): CKMB,  TROPONINI, MYOGLOBIN in the last 168 hours.  Invalid input(s):  CK ------------------------------------------------------------------------------------------------------------------    Component Value Date/Time   BNP 849.0 (H) 12/18/2020 1500     Roxan Hockey M.D on 12/19/2020 at 5:56 PM  Go to [email or web address] - for contact info  [facility] - Office  [phone]

## 2020-12-20 ENCOUNTER — Inpatient Hospital Stay (HOSPITAL_COMMUNITY): Payer: Medicare Other | Admitting: Anesthesiology

## 2020-12-20 ENCOUNTER — Encounter (HOSPITAL_COMMUNITY): Payer: Self-pay | Admitting: Family Medicine

## 2020-12-20 ENCOUNTER — Encounter (HOSPITAL_COMMUNITY)
Admission: EM | Disposition: A | Discharge status: Home / Self Care | Payer: Self-pay | Source: Home / Self Care | Attending: Family Medicine

## 2020-12-20 ENCOUNTER — Other Ambulatory Visit: Payer: Self-pay

## 2020-12-20 DIAGNOSIS — K209 Esophagitis, unspecified without bleeding: Secondary | ICD-10-CM | POA: Diagnosis present

## 2020-12-20 DIAGNOSIS — K279 Peptic ulcer, site unspecified, unspecified as acute or chronic, without hemorrhage or perforation: Secondary | ICD-10-CM | POA: Diagnosis present

## 2020-12-20 DIAGNOSIS — R1310 Dysphagia, unspecified: Secondary | ICD-10-CM

## 2020-12-20 DIAGNOSIS — F10230 Alcohol dependence with withdrawal, uncomplicated: Secondary | ICD-10-CM | POA: Diagnosis not present

## 2020-12-20 DIAGNOSIS — K2270 Barrett's esophagus without dysplasia: Secondary | ICD-10-CM | POA: Diagnosis not present

## 2020-12-20 HISTORY — PX: ESOPHAGEAL DILATION: SHX303

## 2020-12-20 HISTORY — PX: BIOPSY: SHX5522

## 2020-12-20 HISTORY — PX: ESOPHAGOGASTRODUODENOSCOPY (EGD) WITH PROPOFOL: SHX5813

## 2020-12-20 LAB — COMPREHENSIVE METABOLIC PANEL
ALT: 84 U/L — ABNORMAL HIGH (ref 0–44)
AST: 99 U/L — ABNORMAL HIGH (ref 15–41)
Albumin: 3.3 g/dL — ABNORMAL LOW (ref 3.5–5.0)
Alkaline Phosphatase: 72 U/L (ref 38–126)
Anion gap: 12 (ref 5–15)
BUN: 15 mg/dL (ref 6–20)
CO2: 21 mmol/L — ABNORMAL LOW (ref 22–32)
Calcium: 9.3 mg/dL (ref 8.9–10.3)
Chloride: 104 mmol/L (ref 98–111)
Creatinine, Ser: 0.95 mg/dL (ref 0.61–1.24)
GFR, Estimated: >60 mL/min (ref >60)
Glucose, Bld: 75 mg/dL (ref 70–99)
Potassium: 4 mmol/L (ref 3.5–5.1)
Sodium: 137 mmol/L (ref 135–145)
Total Bilirubin: 2.1 mg/dL — ABNORMAL HIGH (ref 0.3–1.2)
Total Protein: 5.9 g/dL — ABNORMAL LOW (ref 6.5–8.1)

## 2020-12-20 SURGERY — ESOPHAGOGASTRODUODENOSCOPY (EGD) WITH PROPOFOL
Anesthesia: General

## 2020-12-20 MED ORDER — GLYCOPYRROLATE 0.2 MG/ML IJ SOLN
INTRAMUSCULAR | Status: DC | PRN
  Administered 2020-12-20: .1 mg via INTRAVENOUS

## 2020-12-20 MED ORDER — LIDOCAINE HCL (CARDIAC) PF 100 MG/5ML IV SOSY
PREFILLED_SYRINGE | INTRAVENOUS | Status: DC | PRN
  Administered 2020-12-20: 50 mg via INTRATRACHEAL

## 2020-12-20 MED ORDER — BUSPIRONE HCL 10 MG PO TABS: 10.0000 mg | ORAL_TABLET | Freq: Three times a day (TID) | ORAL | 2 refills | Status: DC

## 2020-12-20 MED ORDER — ADULT MULTIVITAMIN W/MINERALS CH: 1.0000 | ORAL_TABLET | Freq: Every day | ORAL | 2 refills | Status: DC

## 2020-12-20 MED ORDER — TRAZODONE HCL 150 MG PO TABS: 150.0000 mg | ORAL_TABLET | Freq: Every day | ORAL | 2 refills | Status: DC

## 2020-12-20 MED ORDER — KETAMINE HCL 10 MG/ML IJ SOLN
INTRAMUSCULAR | Status: DC | PRN
  Administered 2020-12-20: 10 mg via INTRAVENOUS

## 2020-12-20 MED ORDER — STERILE WATER FOR IRRIGATION IR SOLN
Status: DC | PRN
  Administered 2020-12-20: 1.5 mL

## 2020-12-20 MED ORDER — THIAMINE HCL 100 MG PO TABS: 100.0000 mg | ORAL_TABLET | Freq: Every day | ORAL | 2 refills | Status: DC

## 2020-12-20 MED ORDER — VENTOLIN HFA 108 (90 BASE) MCG/ACT IN AERS: 2.0000 | INHALATION_SPRAY | RESPIRATORY_TRACT | 0 refills | Status: DC | PRN

## 2020-12-20 MED ORDER — FOLIC ACID 1 MG PO TABS: 1.0000 mg | ORAL_TABLET | Freq: Every day | ORAL | 2 refills | Status: AC

## 2020-12-20 MED ORDER — SODIUM CHLORIDE 0.9 % IV SOLN: INTRAVENOUS | Status: DC

## 2020-12-20 MED ORDER — NICOTINE 14 MG/24HR TD PT24: 14.0000 mg | MEDICATED_PATCH | Freq: Every day | TRANSDERMAL | 0 refills | Status: DC

## 2020-12-20 MED ORDER — CLONIDINE HCL 0.2 MG PO TABS: 0.2000 mg | ORAL_TABLET | Freq: Two times a day (BID) | ORAL | 4 refills | Status: DC | PRN

## 2020-12-20 MED ORDER — ACETAMINOPHEN 325 MG PO TABS: 650.0000 mg | ORAL_TABLET | ORAL | 0 refills | Status: DC | PRN

## 2020-12-20 MED ORDER — ATORVASTATIN CALCIUM 10 MG PO TABS: 10.0000 mg | ORAL_TABLET | Freq: Every day | ORAL | 11 refills | Status: DC

## 2020-12-20 MED ORDER — PROPOFOL 500 MG/50ML IV EMUL
INTRAVENOUS | Status: DC | PRN
  Administered 2020-12-20: 175 ug/kg/min via INTRAVENOUS

## 2020-12-20 MED ORDER — CLONIDINE 0.3 MG/24HR TD PTWK
0.3000 mg | MEDICATED_PATCH | TRANSDERMAL | 5 refills | Status: DC
Start: 2020-12-20 — End: 2022-09-21

## 2020-12-20 MED ORDER — PANTOPRAZOLE SODIUM 40 MG PO TBEC: 40.0000 mg | DELAYED_RELEASE_TABLET | Freq: Two times a day (BID) | ORAL | 5 refills | Status: DC

## 2020-12-20 MED ORDER — HYDROXYZINE HCL 25 MG PO TABS: 25.0000 mg | ORAL_TABLET | Freq: Three times a day (TID) | ORAL | 2 refills | Status: AC | PRN

## 2020-12-20 MED ORDER — PROPOFOL 10 MG/ML IV BOLUS
INTRAVENOUS | Status: DC | PRN
  Administered 2020-12-20: 50 mg via INTRAVENOUS

## 2020-12-20 MED ORDER — KETAMINE HCL 50 MG/5ML IJ SOSY
PREFILLED_SYRINGE | INTRAMUSCULAR | Status: AC
  Filled 2020-12-20: qty 5

## 2020-12-20 MED ORDER — LOSARTAN POTASSIUM 25 MG PO TABS
25.0000 mg | ORAL_TABLET | Freq: Every day | ORAL | Status: DC
  Administered 2020-12-20: 25 mg via ORAL
  Filled 2020-12-20: qty 1

## 2020-12-20 MED ORDER — CARVEDILOL 25 MG PO TABS: 25.0000 mg | ORAL_TABLET | Freq: Two times a day (BID) | ORAL | 2 refills | Status: DC

## 2020-12-20 MED ORDER — LACTATED RINGERS IV SOLN: INTRAVENOUS | Status: DC

## 2020-12-20 MED ORDER — FUROSEMIDE 40 MG PO TABS: 40.0000 mg | ORAL_TABLET | Freq: Every day | ORAL | 3 refills | Status: DC

## 2020-12-20 MED ORDER — SPIRONOLACTONE 25 MG PO TABS: 25.0000 mg | ORAL_TABLET | ORAL | 2 refills | Status: DC

## 2020-12-20 MED ORDER — LOSARTAN POTASSIUM 25 MG PO TABS: 25.0000 mg | ORAL_TABLET | Freq: Every day | ORAL | 3 refills | Status: DC

## 2020-12-20 NOTE — Discharge Instructions (Signed)
1)Avoid ibuprofen/Advil/Aleve/Motrin/Goody Powders/Naproxen/BC powders/Meloxicam/Diclofenac/Indomethacin and other Nonsteroidal anti-inflammatory medications as these will make you more likely to bleed and can cause stomach ulcers, can also cause Kidney problems.   2)Complete Abstinence from alcohol and tobacco advised--- outpatient or inpatient alcohol rehab program would be helpful  3)Please Stop HCTZ/hydrochlorothiazide due to low potassium and low magnesium levels  4) okay to continue Aldactone/Spironolactone and Lasix/furosemide  5)Please follow-up with Gastroenterologist Dr Gala Romney in 4 weeks for recheck and reevaluation  6)Gastroenterologist Dr Gala Romney is recommending that he see an ENT physician for you throat problems  7)Please take buspirone, hydroxyzine and trazodone as prescribed for anxiety and for sleep

## 2020-12-20 NOTE — Anesthesia Preprocedure Evaluation (Signed)
Anesthesia Evaluation  Patient identified by MRN, date of birth, ID band Patient awake    Reviewed: Allergy & Precautions, H&P , NPO status , Patient's Chart, lab work & pertinent test results, reviewed documented beta blocker date and time   Airway Mallampati: II  TM Distance: >3 FB Neck ROM: full    Dental no notable dental hx. (+) Teeth Intact   Pulmonary neg pulmonary ROS, Current Smoker,    Pulmonary exam normal breath sounds clear to auscultation       Cardiovascular Exercise Tolerance: Good hypertension, + CAD   Rhythm:regular Rate:Normal     Neuro/Psych PSYCHIATRIC DISORDERS Anxiety CVA    GI/Hepatic GERD  Medicated,(+)     substance abuse  alcohol use,   Endo/Other  negative endocrine ROS  Renal/GU negative Renal ROS  negative genitourinary   Musculoskeletal   Abdominal   Peds  Hematology negative hematology ROS (+)   Anesthesia Other Findings Alcoholic being treated for DT's.   Reproductive/Obstetrics negative OB ROS                             Anesthesia Physical Anesthesia Plan  ASA: III  Anesthesia Plan: General   Post-op Pain Management:    Induction:   PONV Risk Score and Plan: Propofol infusion  Airway Management Planned:   Additional Equipment:   Intra-op Plan:   Post-operative Plan:   Informed Consent: I have reviewed the patients History and Physical, chart, labs and discussed the procedure including the risks, benefits and alternatives for the proposed anesthesia with the patient or authorized representative who has indicated his/her understanding and acceptance.     Dental Advisory Given  Plan Discussed with: CRNA  Anesthesia Plan Comments:         Anesthesia Quick Evaluation

## 2020-12-20 NOTE — Op Note (Signed)
[facility] Patient Name: Timothy [name] Procedure Date: 12/20/2020 10:40 AM MRN: [id number] Date of Birth: 1971/01/08 Attending MD: Norvel Richards , MD CSN: [id number] Age: 50 Admit Type: Outpatient Procedure:                Upper GI endoscopy Indications:              Dysphagia, Nausea with vomiting Providers:                Norvel Richards, MD, Caprice Kluver, Raphael Gibney, Technician Referring MD:              Medicines:                Propofol per Anesthesia Complications:            No immediate complications. Estimated Blood Loss:     Estimated blood loss was minimal. Procedure:                Pre-Anesthesia Assessment:                           - Prior to the procedure, a History and Physical                            was performed, and patient medications and                            allergies were reviewed. The patient's tolerance of                            previous anesthesia was also reviewed. The risks                            and benefits of the procedure and the sedation                            options and risks were discussed with the patient.                            All questions were answered, and informed consent                            was obtained. Prior Anticoagulants: The patient has                            taken no previous anticoagulant or antiplatelet                            agents. ASA Grade Assessment: III - A patient with                            severe systemic disease. After reviewing the risks  and benefits, the patient was deemed in                            satisfactory condition to undergo the procedure.                           After obtaining informed consent, the endoscope was                            passed under direct vision. Throughout the                            procedure, the patient's blood pressure, pulse, and                            oxygen  saturations were monitored continuously. The                            [id number]) was introduced through the mouth,                            and advanced to the second part of duodenum. The                            upper GI endoscopy was accomplished without                            difficulty. The patient tolerated the procedure                            well. Scope In: 11:25:43 AM Scope Out: 11:38:52 AM Total Procedure Duration: 0 hours 13 minutes 9 seconds  Findings:      Hypopharynx appeared normal. Tubular esophagus patent throughout its       course. Accentuated undulating Z-line. 2 cm "tongue" of salmon-colored       epithelium coming up into the distal esophagus. No nodularity. A couple       of areas of erosions at the margins consistent with reflux esophagitis.      Examination of the gastric mucosa revealed to 7 mm stellate shaped       ulcers with clean bases seen in the antrum. They appear to be benign       lesions. Please see photos. Patent pylorus.      The duodenal bulb and second portion of the duodenum were normal. Scope       was withdrawn. 26 French Maloney dilator passed to full insertion       easily. A look back revealed no apparent complication related to this       maneuver.      Finally, biopsies of the abnormal distal esophagus and gastric ulcers       taken for histologic study. Impression:               -Abnormal distal esophagus - query short segment                            Barrett's esophagus - status  post biopsy following                            Maloney dilation.                           -2 small benign-appearing gastric ulcers?"status                            post biopsy. Likely NSAID related.                           -Normal duodenal bulb and second portion of the                            duodenum.                           -Hypopharynx appeared normal today. However, in                            this clinical setting,  patient should be referred                            for an ENT evaluation. Moderate Sedation:      Moderate (conscious) sedation was personally administered by an       anesthesia professional. The following parameters were monitored: oxygen       saturation, heart rate, blood pressure, respiratory rate, EKG, adequacy       of pulmonary ventilation, and response to care. Recommendation:           - Patient has a contact number available for                            emergencies. The signs and symptoms of potential                            delayed complications were discussed with the                            patient.                           - Return patient to hospital ward for observation.                           - Advance diet as tolerated.                           - Continue present medications. Continue Protonix                            40 mg twice daily?"can probably transition to the                            oral route within the next 24 hours. Obviously,  would be best for patient to alcohol, NSAIDs and                            tobacco moving forward.                           - Outpatient ENT evaluation. Office follow-up with                            [place] in 4 to 6 weeks. At patient request, I called                            Lattie Haw, wife, at [phone]. Got voicemail. Left a                            detailed message. Procedure Code(s):        --- Professional ---                           [phone], Esophagogastroduodenoscopy, flexible,                            transoral; diagnostic, including collection of                            specimen(s) by brushing or washing, when performed                            (separate procedure) Diagnosis Code(s):        --- Professional ---                           R13.10, Dysphagia, unspecified                           R11.2, Nausea with vomiting, unspecified CPT copyright 2019 American  Medical Association. All rights reserved. The codes documented in this report are preliminary and upon coder review may  be revised to meet current compliance requirements. Cristopher Estimable. Paralee Pendergrass, MD Norvel Richards, MD 12/20/2020 11:55:07 AM This report has been signed electronically. Number of Addenda: 0

## 2020-12-20 NOTE — Interval H&P Note (Signed)
History and Physical Interval Note:  12/20/2020 11:10 AM  Timothy [name]  has presented today for surgery, with the diagnosis of Nausea vomiting dysphagia and hematemesis.  The various methods of treatment have been discussed with the patient and family. After consideration of risks, benefits and other options for treatment, the patient has consented to  Procedure(s): ESOPHAGOGASTRODUODENOSCOPY (EGD) WITH PROPOFOL (N/A) ESOPHAGEAL DILATION (N/A) as a surgical intervention.  The patient's history has been reviewed, patient examined, no change in status, stable for surgery.  I have reviewed the patient's chart and labs.  Questions were answered to the patient's satisfaction.     Timothy [name]    Patient seen and examined in Endo.  Has no abdominal pain.  He is hungry.  He is feeling better no nausea.  Dysphagia and "lump" in his throat persist.  We will proceed with EGD with possible esophageal dilation as feasible/appropriate per plan.  The risks, benefits, limitations, alternatives and imponderables have been reviewed with the patient. Potential for esophageal dilation, biopsy, etc. have also been reviewed.  Questions have been answered. All parties agreeable.

## 2020-12-20 NOTE — Progress Notes (Signed)
PIV removed. Discharge paperwork and instructions given to pt. Pt transported via wheelchair with nurse to personal vehicle.

## 2020-12-20 NOTE — Transfer of Care (Signed)
Immediate Anesthesia Transfer of Care Note  Patient: Timothy [name]  Procedure(s) Performed: ESOPHAGOGASTRODUODENOSCOPY (EGD) WITH PROPOFOL (N/A ) ESOPHAGEAL DILATION BIOPSY ESOPHAGEAL DILATION (N/A )  Patient Location: PACU  Anesthesia Type:General  Level of Consciousness: awake  Airway & Oxygen Therapy: Patient Spontanous Breathing  Post-op Assessment: Report given to RN and Post -op Vital signs reviewed and stable  Post vital signs: Reviewed and stable  Last Vitals:  Vitals Value Taken Time  BP    Temp    Pulse    Resp    SpO2      Last Pain:  Vitals:   12/20/20 1119  TempSrc:   PainSc: 0-No pain         Complications: No complications documented.

## 2020-12-20 NOTE — TOC Progression Note (Signed)
TOC provided written list of SA treatment resources for patient's reference. Anticipating dc later today. No other TOC needs identified.

## 2020-12-20 NOTE — Progress Notes (Signed)
Progress Note  Patient Name: Timothy [name] Date of Encounter: 12/20/2020  [facility] Cardiologist: Dr Einar Gip  Subjective   Feels anxious this morning.   Inpatient Medications    Scheduled Meds: . atorvastatin  10 mg Oral QHS  . busPIRone  10 mg Oral TID  . carvedilol  25 mg Oral BID WC  . Chlorhexidine Gluconate Cloth  6 each Topical Q0600  . cloNIDine  0.2 mg Oral BID  . folic acid  1 mg Oral Daily  . multivitamin with minerals  1 tablet Oral Daily  . nicotine  14 mg Transdermal Daily  . pantoprazole  40 mg Oral BID  . spironolactone  25 mg Oral BH-q7a  . thiamine  100 mg Oral Daily   Or  . thiamine  100 mg Intravenous Daily  . traZODone  150 mg Oral QHS   Continuous Infusions: . 0.9 % NaCl with KCl 40 mEq / L 50 mL/hr at 12/20/20 0500   PRN Meds: acetaminophen, LORazepam **OR** LORazepam, ondansetron (ZOFRAN) IV, oxyCODONE   Vital Signs    Vitals:   12/20/20 0503 12/20/20 0535 12/20/20 0600 12/20/20 0700  BP: (!) 132/104  (!) 134/103 (!) 156/101  Pulse: 69  70   Resp: 14  20   Temp:  (!) 97.5 F (36.4 C)    TempSrc:  Oral    SpO2: 97%  90%   Weight:      Height:        Intake/Output Summary (Last 24 hours) at 12/20/2020 0804 Last data filed at 12/20/2020 0500 Gross per 24 hour  Intake 1644.3 ml  Output 1150 ml  Net 494.3 ml   Last 3 Weights 12/20/2020 12/19/2020 12/18/2020  Weight (lbs) 214 lb 1.1 oz 208 lb 5.4 oz 205 lb 4 oz  Weight (kg) 97.1 kg 94.5 kg 93.1 kg      Telemetry    NSR - Personally Reviewed  ECG    n/a - Personally Reviewed  Physical Exam   GEN: No acute distress.   Neck: No JVD Cardiac: RRR, no murmurs, rubs, or gallops.  Respiratory: Clear to auscultation bilaterally. GI: Soft, nontender, non-distended  MS: No edema; No deformity. Neuro:  Nonfocal  Psych: Normal affect   Labs    High Sensitivity Troponin:   Recent Labs  Lab 12/18/20 1025 12/18/20 1427 12/18/20 1927 12/18/20 2146  TROPONINIHS 81* 92* 80* 68*       Chemistry Recent Labs  Lab 12/18/20 1025 12/18/20 1927 12/20/20 0628  NA 137 138 137  K 2.9* 3.4* 4.0  CL 95* 99 104  CO2 23 25 21*  GLUCOSE 114* 84 75  BUN 14 12 15   CREATININE 1.12 0.98 0.95  CALCIUM 9.3 8.2* 9.3  PROT 8.4* 6.6 5.9*  ALBUMIN 4.7 3.7 3.3*  AST 127* 92* 99*  ALT 114* 85* 84*  ALKPHOS 88 69 72  BILITOT 1.4* 2.1* 2.1*  GFRNONAA >60 >60 >60  ANIONGAP 19* 14 12     Hematology Recent Labs  Lab 12/18/20 1025 12/18/20 1736  WBC 7.3 5.7  RBC 5.28 4.91  HGB 17.9* 16.4  HCT 51.2 47.8  MCV 97.0 97.4  MCH 33.9 33.4  MCHC 35.0 34.3  RDW 13.0 12.7  PLT 245 203    BNP Recent Labs  Lab 12/18/20 1500  BNP 849.0*     DDimer No results for input(s): DDIMER in the last 168 hours.   Radiology    DG Chest 1 View  Result Date: 12/18/2020  CLINICAL DATA:  Chest pain. EXAM: CHEST  1 VIEW COMPARISON:  April 11, 2016. FINDINGS: The heart size and mediastinal contours are within normal limits. Both lungs are clear. No pneumothorax or pleural effusion is noted. The visualized skeletal structures are unremarkable. IMPRESSION: No active disease. Electronically Signed   By: Marijo Conception M.D.   On: 12/18/2020 12:26   ECHOCARDIOGRAM COMPLETE  Result Date: 12/19/2020    ECHOCARDIOGRAM REPORT   Patient Name:   Timothy [name] Donner Date of Exam: 12/19/2020 Medical Rec #:  [id number]       Height:       72.0 in Accession #:    [id number]      Weight:       208.3 lb Date of Birth:  Jan 16, 1971       BSA:          2.168 m Patient Age:    44 years        BP:           116/73 mmHg Patient Gender: M               HR:           64 bpm. Exam Location:  [facility] Procedure: 2D Echo Indications:    Chest Pain R07.9  History:        Patient has prior history of Echocardiogram examinations, most                 recent 04/03/2016. CAD, Stroke, Signs/Symptoms:Chest Pain and                 Syncope; Risk Factors:Dyslipidemia and Current Smoker. History                 of Wolff-Parkinson-White  syndrome, ETOH, RBBB.  Sonographer:    Leavy Cella RDCS (AE) Referring Phys: [id number] [place]  1. Left ventricular ejection fraction, by estimation, is 45 to 50%. The left ventricle has mildly decreased function. The left ventricle has no regional wall motion abnormalities. There is moderate left ventricular hypertrophy. Left ventricular diastolic parameters are consistent with Grade I diastolic dysfunction (impaired relaxation).  2. Right ventricular systolic function is normal. The right ventricular size is normal.  3. Left atrial size was mildly dilated.  4. The mitral valve is normal in structure. No evidence of mitral valve regurgitation. No evidence of mitral stenosis.  5. The aortic valve is tricuspid. There is mild calcification of the aortic valve. There is mild thickening of the aortic valve. Aortic valve regurgitation is not visualized. No aortic stenosis is present.  6. The inferior vena cava is normal in size with greater than 50% respiratory variability, suggesting right atrial pressure of 3 mmHg. FINDINGS  Left Ventricle: Left ventricular ejection fraction, by estimation, is 45 to 50%. The left ventricle has mildly decreased function. The left ventricle has no regional wall motion abnormalities. The left ventricular internal cavity size was normal in size. There is moderate left ventricular hypertrophy. Left ventricular diastolic parameters are consistent with Grade I diastolic dysfunction (impaired relaxation). Normal left ventricular filling pressure. Right Ventricle: The right ventricular size is normal. No increase in right ventricular wall thickness. Right ventricular systolic function is normal. Left Atrium: Left atrial size was mildly dilated. Right Atrium: Right atrial size was normal in size. Pericardium: There is no evidence of pericardial effusion. Mitral Valve: The mitral valve is normal in structure. There is mild thickening of the mitral valve leaflet(s). There  is mild calcification of the mitral valve leaflet(s). Mild mitral annular calcification. No evidence of mitral valve regurgitation. No evidence of mitral valve stenosis. Tricuspid Valve: The tricuspid valve is normal in structure. Tricuspid valve regurgitation is trivial. No evidence of tricuspid stenosis. Aortic Valve: The aortic valve is tricuspid. There is mild calcification of the aortic valve. There is mild thickening of the aortic valve. There is mild aortic valve annular calcification. Aortic valve regurgitation is not visualized. No aortic stenosis  is present. Aortic valve mean gradient measures 2.8 mmHg. Aortic valve peak gradient measures 5.0 mmHg. Aortic valve area, by VTI measures 3.02 cm. Pulmonic Valve: The pulmonic valve was not well visualized. Pulmonic valve regurgitation is not visualized. No evidence of pulmonic stenosis. Aorta: The aortic root is normal in size and structure. Pulmonary Artery: Indeterminant PASP, inadequate TR jet. Venous: The inferior vena cava is normal in size with greater than 50% respiratory variability, suggesting right atrial pressure of 3 mmHg. IAS/Shunts: No atrial level shunt detected by color flow Doppler.  LEFT VENTRICLE PLAX 2D LVIDd:         5.18 cm  Diastology LVIDs:         3.84 cm  LV e' medial:    4.47 cm/s LV PW:         1.34 cm  LV E/e' medial:  12.6 LV IVS:        1.30 cm  LV e' lateral:   5.17 cm/s LVOT diam:     2.10 cm  LV E/e' lateral: 10.9 LV SV:         61 LV SV Index:   28 LVOT Area:     3.46 cm  RIGHT VENTRICLE RV S prime:     12.30 cm/s TAPSE (M-mode): 2.5 cm LEFT ATRIUM             Index       RIGHT ATRIUM           Index LA diam:        4.30 cm 1.98 cm/m  RA Area:     16.70 cm LA Vol (A2C):   89.4 ml 41.24 ml/m RA Volume:   43.40 ml  20.02 ml/m LA Vol (A4C):   65.5 ml 30.21 ml/m LA Biplane Vol: 81.5 ml 37.59 ml/m  AORTIC VALVE AV Area (Vmax):    2.20 cm AV Area (Vmean):   1.77 cm AV Area (VTI):     3.02 cm AV Vmax:           111.69 cm/s  AV Vmean:          78.564 cm/s AV VTI:            0.202 m AV Peak Grad:      5.0 mmHg AV Mean Grad:      2.8 mmHg LVOT Vmax:         70.80 cm/s LVOT Vmean:        40.100 cm/s LVOT VTI:          0.176 m LVOT/AV VTI ratio: 0.87  AORTA Ao Root diam: 3.80 cm MITRAL VALVE MV Area (PHT): 3.39 cm     SHUNTS MV Decel Time: 224 msec     Systemic VTI:  0.18 m MV E velocity: 56.10 cm/s   Systemic Diam: 2.10 cm MV A velocity: 108.00 cm/s MV E/A ratio:  0.52 Carlyle Dolly MD Electronically signed by Carlyle Dolly MD Signature Date/Time: 12/19/2020/3:45:43 PM    Final  Cardiac Studies     Patient Profile  CALOGERO GEISEN a 50 y.o.malewith a hx of NICM LVEF 35-40% in 2017, 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  Assessment & Plan   1. Atypical chest pain - mild trop in setting of severe HTN, tachycardia trending down - overall not consistent with ischemia - cath 2017 mild to mod CAD, 07/2020 nuclear stress no ischemia - echo LVEF 45-50%, low normal to mildly decreased. No focal WMAs. Could be related to poorly controlled HTN vs alcohol use. He had LVEF 35-40% in 2017 that later normalized. Continue coreg, aldactone, bp control and EtoH cessation. Add losartan 25mg  daily  2. Hypertensive emergency - SBPs in the 200s on admission, chest pain with mild troponin elevation - started on NG drip transiently now off - on coreg 25mg  bid, clonidine 0.2mg  bid, aldactone 25mg  daily. Bp's up and down, adding losartan 25mg  daily.   3. EtOH abuse - per primary team - EtOH level was undetectable. UDS is still pending  4. Vomiting with intermittent hematemsis/Dysphagia - per GI   No further cardiology workup planned at this time, he will need to f/u with his primary cardiologist Dr Einar Gip at discharged We will sign  off inpatient care.   For questions or updates, please contact [place] Please consult [email or web address] for contact info under        Signed, Carlyle Dolly, MD  12/20/2020, 8:04 AM

## 2020-12-20 NOTE — Anesthesia Postprocedure Evaluation (Signed)
Anesthesia Post Note  Patient: Timothy [name]  Procedure(s) Performed: ESOPHAGOGASTRODUODENOSCOPY (EGD) WITH PROPOFOL (N/A ) ESOPHAGEAL DILATION BIOPSY ESOPHAGEAL DILATION (N/A )  Patient location during evaluation: Phase II Anesthesia Type: General Level of consciousness: awake Pain management: pain level controlled Vital Signs Assessment: post-procedure vital signs reviewed and stable Respiratory status: spontaneous breathing Cardiovascular status: stable Postop Assessment: no apparent nausea or vomiting Anesthetic complications: no   No complications documented.   Last Vitals:  Vitals:   12/20/20 0909 12/20/20 1147  BP: (!) 143/109   Pulse: 77 66  Resp: 19   Temp: 36.6 C 36.6 C  SpO2: 97%     Last Pain:  Vitals:   12/20/20 1147  TempSrc: Oral  PainSc:                  Elon Lomeli Hristova

## 2020-12-20 NOTE — Discharge Summary (Signed)
Timothy [name], is a 50 y.o. male  DOB February 21, 1971  MRN [id number].  Admission date:  12/18/2020  Admitting Physician  Randall Rampersad Denton Brick, MD  Discharge Date:  12/20/2020   Primary MD  Redmond School, MD  Recommendations for primary care physician for things to follow:   1)Avoid ibuprofen/Advil/Aleve/Motrin/Goody Powders/Naproxen/BC powders/Meloxicam/Diclofenac/Indomethacin and other Nonsteroidal anti-inflammatory medications as these will make you more likely to bleed and can cause stomach ulcers, can also cause Kidney problems.   2) complete abstinence from alcohol and tobacco advised--- outpatient or inpatient alcohol rehab program would be helpful  3) please stop HCTZ/hydrochlorothiazide due to low potassium and low magnesium levels  4) okay to continue Aldactone/spironolactone and Lasix/furosemide  5) please follow-up with gastroenterologist Dr Gala Romney in 4 weeks for recheck and reevaluation  6) gastroenterologist Dr Gala Romney is recommending that he see an ENT physician for you throat problems  7) please take buspirone, hydroxyzine and trazodone as prescribed for anxiety and for sleep   Admission Diagnosis  Weak [R53.1] Atypical chest pain [R07.89] Chest pain [R07.9] Other forms of angina pectoris ([place]) [I20.8] Alcohol withdrawal ([place]) [F10.239]   Discharge Diagnosis  Weak [R53.1] Atypical chest pain [R07.89] Chest pain [R07.9] Other forms of angina pectoris ([place]) [I20.8] Alcohol withdrawal ([place]) [F10.239]    Active Problems:   PUD (peptic ulcer disease)/Gastric Ulcers---etoh and Nsaid related   Esophagitis-etoh and NSAID related   Anxiety state   Tobacco abuse   Chronic anxiety   ETOH abuse   Chest pain   Dysphagia   Atypical chest pain   Alcohol withdrawal ([place])      Past Medical History:  Diagnosis Date  . Anxiety   . Arthritis   . CAD (coronary artery disease) 2007   50% stenosis  small diagonal  . Central sleep apnea   . Chronic pain    since 1998 has been on chronic pain medication  . Fatty liver   . GERD (gastroesophageal reflux disease)   . Hemorrhoids   . Hepatomegaly 10/03/2014  . HTN (hypertension)   . Palpitations   . Syncope   . Tachycardia   . Tobacco abuse   . Tubular adenoma   . Wolff-Parkinson-White (WPW) syndrome    says was cured with ablation    Past Surgical History:  Procedure Laterality Date  . ADENOIDECTOMY    . APPENDECTOMY    . ATRIAL ABLATION SURGERY    . AV NODE ABLATION    . BIOPSY N/A 05/12/2013   Procedure: BIOPSY;  Surgeon: Daneil Dolin, MD;  Location: [facility];  Service: Endoscopy;  Laterality: N/A;  . CARDIAC CATHETERIZATION N/A 05/08/2016   Procedure: Right/Left Heart Cath and Coronary Angiography;  Surgeon: Peter M Martinique, MD;  Location: [facility];  Service: Cardiovascular;  Laterality: N/A;  . CARDIAC ELECTROPHYSIOLOGY STUDY AND ABLATION     SA node ablation  . COLONOSCOPY     age 89  . COLONOSCOPY WITH PROPOFOL N/A 05/12/2013   Dr. Gala Romney- hemorrhoids, tubular adenoma  . ESOPHAGOGASTRODUODENOSCOPY (EGD) WITH  PROPOFOL N/A 05/12/2013   Dr. Gala Romney- hiatal hernia, erosive reflux esophagitis  . HAND SURGERY    . HEMORRHOID BANDING  06/22/13  . POLYPECTOMY N/A 05/12/2013   Procedure: POLYPECTOMY;  Surgeon: Daneil Dolin, MD;  Location: [facility];  Service: Endoscopy;  Laterality: N/A;  . SHOULDER SURGERY       HPI  from the history and physical done on the day of admission:    Chief Complaint: Intermittent nausea/vomiting, some hematemesis and chest pain.  HPI: Timothy [name] is a 50 y.o. male with medical history significant of hypertension, gastroesophageal flux disease, tobacco abuse, prior history of Wolff-Parkinson-White syndrome, fatty liver disease, chronic combined CHF, alcohol abuse (ongoing), prior history of coronary disease (50% stenosis small diagonal); who presented to the emergency department secondary  to chest discomfort, intermittent nausea/vomiting and a scant hematemesis.  Patient reports symptoms present for the last 3 to 4 days.  Worse in the last 2.  He was eventually seen by gastroenterologist as an outpatient for concerns of ongoing reflux symptoms, vomiting and feeling of food being stuck in his chest.  Plan was for outpatient endoscopic evaluation.  24 hours after that patient noticing some blood going along with the vomiting and ended experiencing sustained with chest discomfort slightly radiated to the left, lasting approximately 2 hours and with intensity of 6-7/10.  ED Course: EKG demonstrated sinus tachycardia, elevated troponin in the 80s, potassium 2.9 mild transaminitis, lipase of 63 and a chest x-ray without acute process.  Elevated blood pressure seen (systolic blood pressure 562'Z and diastolic BP in the 308'M range), Case discussed with cardiology service who recommended electrolyte repletion, blood pressure control and cycle troponin.  TRH was contacted to place in the hospital for further evaluation and management of chest pain with ongoing concern for hematemesis positive.  GI was also consulted   Hospital Course:   Brief Summary:- 50 y.o.malewith medical history significant ofhypertension, gastroesophageal flux disease, tobacco abuse, prior history of Wolff-Parkinson-White syndrome, fatty liver disease, chronic combined CHF,alcohol abuse (ongoing), prior history of coronary disease (50% stenosis small diagonal); admitted on 12/18/2020 with small episode of hematemesis in the setting of persistent nausea vomiting and alcohol intoxication/alcohol withdrawal -tremors and anxiety  resolved  A/p 1) persistent emesis/hematemesis and dysphagia--- in the setting of alcohol abuse, - GI consult appreciated, s/p  EGD   on 12/20/2020 which showed Abnormal distal esophagus - query short segment , Barrett's esophagus - status post biopsy following Maloney dilatio and -2 small  benign-appearing gastric ulcers status post biopsy. Likely NSAID related -Avoid NSAIDs, avoid EtOH, -Compliance with PPI advised -ENT follow-up for dysphagia advised   2)Uncontrolled HTN--off nitro drip, BP improved as alcohol withdrawal symptoms improved -Discharged home on antihypertensives as outlined in discharge med rec  3) combined systolic and diastolic dysfunction CHF-- Syndrome--repeat echo with EF of 45 to 50% with grade 1 diastolic dysfunction -Cardiology consult appreciated no plans for ischemia work-up at this time -Troponins noted -Continue Coreg and Aldactone  4) alcohol abuse alcohol withdrawal---tremors and anxiety  resolved after treatment with IV lorazepam -   continue folic acid and thiamine -Abstinence from alcohol advised -UDS with benzos and THC  5) hypokalemia/hypomagnesemia--- in the setting of vomiting and alcohol abuse -replaced   6) atypical chest pain--- please see #3 above LHC 2017 with mild to moderate nonobstructive CAD -Myoview from 08/04/2020 without reversible ischemia  7)H/o WPW---history ablation x 3 at [facility]--continue Coreg  8) anxiety/Insomnia----trazodone, hydroxyzine and BuSpar , abstinence from alcohol advised  Disposition/--discharge home with family   Disposition: The patient is from: Home  Anticipated d/c is to: Home  Code Status :  -  Code Status: Full Code   Family Communication:   (patient is alert, awake and coherent)   Consults  :  Gi/card  Discharge Condition: stable  Follow UP   Follow-up Information    Adrian Prows, MD. Schedule an appointment as soon as possible for a visit in 2 week(s).   Specialty: Cardiology Contact information: [place] [address] [phone]                Diet and Activity recommendation:  As advised  Discharge Instructions    Discharge Instructions    Call MD for:  difficulty breathing, headache or visual disturbances    Complete by: As directed    Call MD for:  persistant dizziness or light-headedness   Complete by: As directed    Call MD for:  persistant nausea and vomiting   Complete by: As directed    Call MD for:  temperature >100.4   Complete by: As directed    Diet - low sodium heart healthy   Complete by: As directed    Discharge instructions   Complete by: As directed    1)Avoid ibuprofen/Advil/Aleve/Motrin/Goody Powders/Naproxen/BC powders/Meloxicam/Diclofenac/Indomethacin and other Nonsteroidal anti-inflammatory medications as these will make you more likely to bleed and can cause stomach ulcers, can also cause Kidney problems.   2) complete abstinence from alcohol and tobacco advised--- outpatient or inpatient alcohol rehab program would be helpful  3) please stop HCTZ/hydrochlorothiazide due to low potassium and low magnesium levels  4) okay to continue Aldactone/spironolactone and Lasix/furosemide  5) please follow-up with gastroenterologist Dr Gala Romney in 4 weeks for recheck and reevaluation  6) gastroenterologist Dr Gala Romney is recommending that he see an ENT physician for you throat problems  7) please take buspirone, hydroxyzine and trazodone as prescribed for anxiety and for sleep   Increase activity slowly   Complete by: As directed         Discharge Medications     Allergies as of 12/20/2020      Reactions   Morphine Other (See Comments)   Makes overly sleepy      Medication List    STOP taking these medications   ALKA-SELTZER PLUS COLD & FLU PO   aspirin 325 MG tablet   BC HEADACHE POWDER PO   co-enzyme Q-10 30 MG capsule   FLAX SEED OIL PO   Garlic 10 MG Caps   hydrochlorothiazide 25 MG tablet Commonly known as: HYDRODIURIL   PEPCID PO   potassium chloride SA 20 MEQ tablet Commonly known as: KLOR-CON     TAKE these medications   acetaminophen 325 MG tablet Commonly known as: TYLENOL Take 2 tablets (650 mg total) by mouth every 4 (four) hours as  needed for mild pain, fever or headache. What changed:   medication strength  how much to take  when to take this  reasons to take this   atorvastatin 10 MG tablet Commonly known as: LIPITOR Take 1 tablet (10 mg total) by mouth at bedtime.   busPIRone 10 MG tablet Commonly known as: BUSPAR Take 1 tablet (10 mg total) by mouth 3 (three) times daily. For anxiety What changed:   when to take this  additional instructions   carvedilol 25 MG tablet Commonly known as: COREG Take 1 tablet (25 mg total) by mouth 2 (two) times daily with a  meal. What changed: See the new instructions.   cloNIDine 0.2 MG tablet Commonly known as: CATAPRES Take 1 tablet (0.2 mg total) by mouth 2 (two) times daily as needed (BP >150/90).   cloNIDine 0.3 mg/24hr patch Commonly known as: CATAPRES - Dosed in mg/24 hr Place 1 patch (0.3 mg total) onto the skin once a week. What changed: how to take this   folic acid 1 MG tablet Commonly known as: FOLVITE Take 1 tablet (1 mg total) by mouth daily. Start taking on: December 21, 2020   furosemide 40 MG tablet Commonly known as: LASIX Take 1 tablet (40 mg total) by mouth daily. What changed: when to take this   hydrOXYzine 25 MG tablet Commonly known as: ATARAX/VISTARIL Take 1 tablet (25 mg total) by mouth 3 (three) times daily as needed for anxiety or nausea.   losartan 25 MG tablet Commonly known as: COZAAR Take 1 tablet (25 mg total) by mouth daily. Start taking on: December 21, 2020   multivitamin with minerals Tabs tablet Take 1 tablet by mouth daily. Start taking on: December 21, 2020   nicotine 14 mg/24hr patch Commonly known as: NICODERM CQ - dosed in mg/24 hours Place 1 patch (14 mg total) onto the skin daily. Start taking on: December 21, 2020   oxyCODONE 15 MG immediate release tablet Commonly known as: ROXICODONE Take 15 mg by mouth every 4 (four) hours as needed for pain.   pantoprazole 40 MG tablet Commonly known as: PROTONIX Take 1  tablet (40 mg total) by mouth 2 (two) times daily.   spironolactone 25 MG tablet Commonly known as: ALDACTONE Take 1 tablet (25 mg total) by mouth every morning.   thiamine 100 MG tablet Take 1 tablet (100 mg total) by mouth daily. Start taking on: December 21, 2020   traZODone 150 MG tablet Commonly known as: DESYREL Take 1 tablet (150 mg total) by mouth at bedtime. For Sleep and Anxiety   Ventolin HFA 108 (90 Base) MCG/ACT inhaler Generic drug: albuterol Inhale 2 puffs into the lungs every 4 (four) hours as needed for wheezing or shortness of breath.   Vitamin D3 25 MCG (1000 UT) Caps Take 2 capsules by mouth daily.       Major procedures and Radiology Reports - PLEASE review detailed and final reports for all details, in brief    DG Chest 1 View  Result Date: 12/18/2020 CLINICAL DATA:  Chest pain. EXAM: CHEST  1 VIEW COMPARISON:  April 11, 2016. FINDINGS: The heart size and mediastinal contours are within normal limits. Both lungs are clear. No pneumothorax or pleural effusion is noted. The visualized skeletal structures are unremarkable. IMPRESSION: No active disease. Electronically Signed   By: Marijo Conception M.D.   On: 12/18/2020 12:26   ECHOCARDIOGRAM COMPLETE  Result Date: 12/19/2020    ECHOCARDIOGRAM REPORT   Patient Name:   GODSON POLLAN Macha Date of Exam: 12/19/2020 Medical Rec #:  [id number]       Height:       72.0 in Accession #:    [id number]      Weight:       208.3 lb Date of Birth:  [birth year]       BSA:          2.168 m Patient Age:    26 years        BP:           116/73 mmHg Patient Gender: M  HR:           64 bpm. Exam Location:  [facility] Procedure: 2D Echo Indications:    Chest Pain R07.9  History:        Patient has prior history of Echocardiogram examinations, most                 recent 04/03/2016. CAD, Stroke, Signs/Symptoms:Chest Pain and                 Syncope; Risk Factors:Dyslipidemia and Current Smoker. History                 of  Wolff-Parkinson-White syndrome, ETOH, RBBB.  Sonographer:    Leavy Cella RDCS (AE) Referring Phys: [id number] [place]  1. Left ventricular ejection fraction, by estimation, is 45 to 50%. The left ventricle has mildly decreased function. The left ventricle has no regional wall motion abnormalities. There is moderate left ventricular hypertrophy. Left ventricular diastolic parameters are consistent with Grade I diastolic dysfunction (impaired relaxation).  2. Right ventricular systolic function is normal. The right ventricular size is normal.  3. Left atrial size was mildly dilated.  4. The mitral valve is normal in structure. No evidence of mitral valve regurgitation. No evidence of mitral stenosis.  5. The aortic valve is tricuspid. There is mild calcification of the aortic valve. There is mild thickening of the aortic valve. Aortic valve regurgitation is not visualized. No aortic stenosis is present.  6. The inferior vena cava is normal in size with greater than 50% respiratory variability, suggesting right atrial pressure of 3 mmHg. FINDINGS  Left Ventricle: Left ventricular ejection fraction, by estimation, is 45 to 50%. The left ventricle has mildly decreased function. The left ventricle has no regional wall motion abnormalities. The left ventricular internal cavity size was normal in size. There is moderate left ventricular hypertrophy. Left ventricular diastolic parameters are consistent with Grade I diastolic dysfunction (impaired relaxation). Normal left ventricular filling pressure. Right Ventricle: The right ventricular size is normal. No increase in right ventricular wall thickness. Right ventricular systolic function is normal. Left Atrium: Left atrial size was mildly dilated. Right Atrium: Right atrial size was normal in size. Pericardium: There is no evidence of pericardial effusion. Mitral Valve: The mitral valve is normal in structure. There is mild thickening of the mitral  valve leaflet(s). There is mild calcification of the mitral valve leaflet(s). Mild mitral annular calcification. No evidence of mitral valve regurgitation. No evidence of mitral valve stenosis. Tricuspid Valve: The tricuspid valve is normal in structure. Tricuspid valve regurgitation is trivial. No evidence of tricuspid stenosis. Aortic Valve: The aortic valve is tricuspid. There is mild calcification of the aortic valve. There is mild thickening of the aortic valve. There is mild aortic valve annular calcification. Aortic valve regurgitation is not visualized. No aortic stenosis  is present. Aortic valve mean gradient measures 2.8 mmHg. Aortic valve peak gradient measures 5.0 mmHg. Aortic valve area, by VTI measures 3.02 cm. Pulmonic Valve: The pulmonic valve was not well visualized. Pulmonic valve regurgitation is not visualized. No evidence of pulmonic stenosis. Aorta: The aortic root is normal in size and structure. Pulmonary Artery: Indeterminant PASP, inadequate TR jet. Venous: The inferior vena cava is normal in size with greater than 50% respiratory variability, suggesting right atrial pressure of 3 mmHg. IAS/Shunts: No atrial level shunt detected by color flow Doppler.  LEFT VENTRICLE PLAX 2D LVIDd:         5.18 cm  Diastology LVIDs:  3.84 cm  LV e' medial:    4.47 cm/s LV PW:         1.34 cm  LV E/e' medial:  12.6 LV IVS:        1.30 cm  LV e' lateral:   5.17 cm/s LVOT diam:     2.10 cm  LV E/e' lateral: 10.9 LV SV:         61 LV SV Index:   28 LVOT Area:     3.46 cm  RIGHT VENTRICLE RV S prime:     12.30 cm/s TAPSE (M-mode): 2.5 cm LEFT ATRIUM             Index       RIGHT ATRIUM           Index LA diam:        4.30 cm 1.98 cm/m  RA Area:     16.70 cm LA Vol (A2C):   89.4 ml 41.24 ml/m RA Volume:   43.40 ml  20.02 ml/m LA Vol (A4C):   65.5 ml 30.21 ml/m LA Biplane Vol: 81.5 ml 37.59 ml/m  AORTIC VALVE AV Area (Vmax):    2.20 cm AV Area (Vmean):   1.77 cm AV Area (VTI):     3.02 cm AV  Vmax:           111.69 cm/s AV Vmean:          78.564 cm/s AV VTI:            0.202 m AV Peak Grad:      5.0 mmHg AV Mean Grad:      2.8 mmHg LVOT Vmax:         70.80 cm/s LVOT Vmean:        40.100 cm/s LVOT VTI:          0.176 m LVOT/AV VTI ratio: 0.87  AORTA Ao Root diam: 3.80 cm MITRAL VALVE MV Area (PHT): 3.39 cm     SHUNTS MV Decel Time: 224 msec     Systemic VTI:  0.18 m MV E velocity: 56.10 cm/s   Systemic Diam: 2.10 cm MV A velocity: 108.00 cm/s MV E/A ratio:  0.52 Carlyle Dolly MD Electronically signed by Carlyle Dolly MD Signature Date/Time: 12/19/2020/3:45:43 PM    Final     Micro Results   Recent Results (from the past 240 hour(s))  Resp Panel by RT-PCR (Flu A&B, Covid) Nasopharyngeal Swab     Status: None   Collection Time: 12/18/20  4:41 PM   Specimen: Nasopharyngeal Swab; Nasopharyngeal(NP) swabs in vial transport medium  Result Value Ref Range Status   SARS Coronavirus 2 by RT PCR NEGATIVE NEGATIVE Final    Comment: (NOTE) SARS-CoV-2 target nucleic acids are NOT DETECTED.  The SARS-CoV-2 RNA is generally detectable in upper respiratory specimens during the acute phase of infection. The lowest concentration of SARS-CoV-2 viral copies this assay can detect is 138 copies/mL. A negative result does not preclude SARS-Cov-2 infection and should not be used as the sole basis for treatment or other patient management decisions. A negative result may occur with  improper specimen collection/handling, submission of specimen other than nasopharyngeal swab, presence of viral mutation(s) within the areas targeted by this assay, and inadequate number of viral copies(<138 copies/mL). A negative result must be combined with clinical observations, patient history, and epidemiological information. The expected result is Negative.  Fact Sheet for Patients:  EntrepreneurPulse.com.au  Fact Sheet for Healthcare Providers:  IncredibleEmployment.be  This  test  is no t yet approved or cleared by the [place] and  has been authorized for detection and/or diagnosis of SARS-CoV-2 by FDA under an Emergency Use Authorization (EUA). This EUA will remain  in effect (meaning this test can be used) for the duration of the COVID-19 declaration under Section 564(b)(1) of the Act, 21 U.S.C.section 360bbb-3(b)(1), unless the authorization is terminated  or revoked sooner.       Influenza A by PCR NEGATIVE NEGATIVE Final   Influenza B by PCR NEGATIVE NEGATIVE Final    Comment: (NOTE) The Xpert Xpress SARS-CoV-2/FLU/RSV plus assay is intended as an aid in the diagnosis of influenza from Nasopharyngeal swab specimens and should not be used as a sole basis for treatment. Nasal washings and aspirates are unacceptable for Xpert Xpress SARS-CoV-2/FLU/RSV testing.  Fact Sheet for Patients: EntrepreneurPulse.com.au  Fact Sheet for Healthcare Providers: IncredibleEmployment.be  This test is not yet approved or cleared by the [place] FDA and has been authorized for detection and/or diagnosis of SARS-CoV-2 by FDA under an Emergency Use Authorization (EUA). This EUA will remain in effect (meaning this test can be used) for the duration of the COVID-19 declaration under Section 564(b)(1) of the Act, 21 U.S.C. section 360bbb-3(b)(1), unless the authorization is terminated or revoked.  Performed at [facility], [address] [place], [place] [address]   MRSA PCR Screening     Status: None   Collection Time: 12/18/20  6:24 PM   Specimen: Nasal Mucosa; Nasopharyngeal  Result Value Ref Range Status   MRSA by PCR NEGATIVE NEGATIVE Final    Comment:        The GeneXpert MRSA Assay (FDA approved for NASAL specimens only), is one component of a comprehensive MRSA colonization surveillance program. It is not intended to diagnose MRSA infection nor to guide or monitor treatment for MRSA  infections. Performed at [facility], [address] [place], [place] [address]        Today   Subjective    Sylvanus Telford today has no new complaints -Tolerated oral intake after EGD -Dad and mom at bedside questions answered -No chest pains no dizziness no palpitations, eager to go home          Patient has been seen and examined prior to discharge   Objective   Blood pressure 132/84, pulse 64, temperature 97.8 F (36.6 C), temperature source Oral, resp. rate 12, height 6' (1.829 m), weight 90.7 kg, SpO2 100 %.   Intake/Output Summary (Last 24 hours) at 12/20/2020 1536 Last data filed at 12/20/2020 1507 Gross per 24 hour  Intake 2680.38 ml  Output 1150 ml  Net 1530.38 ml    Exam Gen:- Awake Alert, no acute distress  HEENT:- [place].AT, No sclera icterus Neck-Supple Neck,No JVD,.  Lungs-  CTAB , good air movement bilaterally  CV- S1, S2 normal, regular Abd-  +ve B.Sounds, Abd Soft, No tenderness,    Extremity/Skin:- No  edema,   good pulses Psych-affect is appropriate, oriented x3 Neuro-no new focal deficits,     Data Review   CBC w Diff:  Lab Results  Component Value Date   WBC 5.7 12/18/2020   HGB 16.4 12/18/2020   HCT 47.8 12/18/2020   PLT 203 12/18/2020   LYMPHOPCT 21 12/18/2020   MONOPCT 11 12/18/2020   EOSPCT 0 12/18/2020   BASOPCT 1 12/18/2020    CMP:  Lab Results  Component Value Date   NA 137 12/20/2020   K 4.0 12/20/2020   CL 104  12/20/2020   CO2 21 (L) 12/20/2020   BUN 15 12/20/2020   CREATININE 0.95 12/20/2020   PROT 5.9 (L) 12/20/2020   ALBUMIN 3.3 (L) 12/20/2020   BILITOT 2.1 (H) 12/20/2020   ALKPHOS 72 12/20/2020   AST 99 (H) 12/20/2020   ALT 84 (H) 12/20/2020  .   Total Discharge time is about 33 minutes  Roxan Hockey M.D on 12/20/2020 at 3:36 PM  Go to [email or web address] -  for contact info  [facility] - Office  [phone]

## 2020-12-21 ENCOUNTER — Encounter: Payer: Self-pay | Admitting: Internal Medicine

## 2020-12-21 ENCOUNTER — Encounter (HOSPITAL_COMMUNITY): Payer: Self-pay | Admitting: Internal Medicine

## 2020-12-21 LAB — SURGICAL PATHOLOGY

## 2020-12-23 ENCOUNTER — Encounter: Payer: Self-pay | Admitting: Internal Medicine

## 2020-12-24 DIAGNOSIS — G894 Chronic pain syndrome: Secondary | ICD-10-CM | POA: Diagnosis not present

## 2020-12-25 ENCOUNTER — Other Ambulatory Visit: Payer: Self-pay

## 2020-12-25 ENCOUNTER — Telehealth: Payer: Self-pay | Admitting: Internal Medicine

## 2020-12-25 MED ORDER — DEXLANSOPRAZOLE 60 MG PO CPDR: 60.0000 mg | DELAYED_RELEASE_CAPSULE | Freq: Every day | ORAL | 11 refills | Status: DC

## 2020-12-25 NOTE — Telephone Encounter (Signed)
PATIENT SAW DR Nicolaus AND HE NEEDS DEXILANT SENT TO [facility] [phone]

## 2020-12-25 NOTE — Telephone Encounter (Signed)
RX sent to pts pharmacy.  

## 2020-12-31 ENCOUNTER — Telehealth: Payer: Self-pay

## 2020-12-31 ENCOUNTER — Telehealth: Payer: Self-pay | Admitting: *Deleted

## 2020-12-31 MED ORDER — PANTOPRAZOLE SODIUM 40 MG PO TBEC: 40.0000 mg | DELAYED_RELEASE_TABLET | Freq: Two times a day (BID) | ORAL | 5 refills | Status: DC

## 2020-12-31 NOTE — Addendum Note (Signed)
Addended by: Annitta Needs on: 12/31/2020 09:53 AM   Modules accepted: Orders

## 2020-12-31 NOTE — Telephone Encounter (Signed)
Resume Protonix BID. I have sent this to the pharmacy.

## 2020-12-31 NOTE — Telephone Encounter (Signed)
I sent in Protonix BID to take.

## 2020-12-31 NOTE — Telephone Encounter (Signed)
I checked Cover My Meds and [facility] has not done a PA on this pt. So he either waits for this or change Rx.

## 2020-12-31 NOTE — Telephone Encounter (Signed)
Patient states the pharmacy is still advising him they do not have his dexilant Rx

## 2020-12-31 NOTE — Telephone Encounter (Signed)
Phoned to the pharmacy first to see if they had the pt's Rx and they did, but both generic and name brand requires a PA  2. Phoned and spoke with the pt and advised him of this and advised of how long it may/maynot take for this PA. Pt is requesting "what else can be done for him because he needs something". I advised the pt that I would send a note to see and I will check Cover My Meds to see if [facility]mo Putt did a PA on this pt.

## 2020-12-31 NOTE — Telephone Encounter (Signed)
Phoned and advised the pt that Rx for Protonix was called in to the pharmacy and gave him the instructions

## 2020-12-31 NOTE — Telephone Encounter (Signed)
See other phone note

## 2021-01-17 DIAGNOSIS — J22 Unspecified acute lower respiratory infection: Secondary | ICD-10-CM | POA: Diagnosis not present

## 2021-01-17 DIAGNOSIS — G894 Chronic pain syndrome: Secondary | ICD-10-CM | POA: Diagnosis not present

## 2021-02-09 ENCOUNTER — Emergency Department (HOSPITAL_COMMUNITY): Payer: Medicare Other

## 2021-02-09 ENCOUNTER — Encounter (HOSPITAL_COMMUNITY): Payer: Self-pay

## 2021-02-09 ENCOUNTER — Observation Stay (HOSPITAL_COMMUNITY)
Admission: EM | Admit: 2021-02-09 | Discharge: 2021-02-10 | Disposition: A | Discharge status: Home / Self Care | Payer: Medicare Other | Attending: Family Medicine | Admitting: Family Medicine

## 2021-02-09 ENCOUNTER — Other Ambulatory Visit: Payer: Self-pay

## 2021-02-09 DIAGNOSIS — M47816 Spondylosis without myelopathy or radiculopathy, lumbar region: Secondary | ICD-10-CM | POA: Diagnosis not present

## 2021-02-09 DIAGNOSIS — I110 Hypertensive heart disease with heart failure: Secondary | ICD-10-CM | POA: Diagnosis not present

## 2021-02-09 DIAGNOSIS — Z8679 Personal history of other diseases of the circulatory system: Secondary | ICD-10-CM

## 2021-02-09 DIAGNOSIS — N281 Cyst of kidney, acquired: Secondary | ICD-10-CM | POA: Diagnosis not present

## 2021-02-09 DIAGNOSIS — R1010 Upper abdominal pain, unspecified: Secondary | ICD-10-CM | POA: Insufficient documentation

## 2021-02-09 DIAGNOSIS — R109 Unspecified abdominal pain: Secondary | ICD-10-CM

## 2021-02-09 DIAGNOSIS — I2583 Coronary atherosclerosis due to lipid rich plaque: Secondary | ICD-10-CM

## 2021-02-09 DIAGNOSIS — Z20822 Contact with and (suspected) exposure to covid-19: Secondary | ICD-10-CM | POA: Insufficient documentation

## 2021-02-09 DIAGNOSIS — F17210 Nicotine dependence, cigarettes, uncomplicated: Secondary | ICD-10-CM | POA: Insufficient documentation

## 2021-02-09 DIAGNOSIS — I5042 Chronic combined systolic (congestive) and diastolic (congestive) heart failure: Secondary | ICD-10-CM | POA: Diagnosis not present

## 2021-02-09 DIAGNOSIS — K2270 Barrett's esophagus without dysplasia: Secondary | ICD-10-CM | POA: Diagnosis present

## 2021-02-09 DIAGNOSIS — R112 Nausea with vomiting, unspecified: Secondary | ICD-10-CM | POA: Diagnosis not present

## 2021-02-09 DIAGNOSIS — F10939 Alcohol use, unspecified with withdrawal, unspecified: Secondary | ICD-10-CM | POA: Diagnosis present

## 2021-02-09 DIAGNOSIS — Z720 Tobacco use: Secondary | ICD-10-CM | POA: Diagnosis present

## 2021-02-09 DIAGNOSIS — K2920 Alcoholic gastritis without bleeding: Secondary | ICD-10-CM | POA: Diagnosis not present

## 2021-02-09 DIAGNOSIS — F10230 Alcohol dependence with withdrawal, uncomplicated: Secondary | ICD-10-CM | POA: Diagnosis not present

## 2021-02-09 DIAGNOSIS — E876 Hypokalemia: Secondary | ICD-10-CM | POA: Diagnosis not present

## 2021-02-09 DIAGNOSIS — K209 Esophagitis, unspecified without bleeding: Secondary | ICD-10-CM | POA: Diagnosis present

## 2021-02-09 DIAGNOSIS — F1010 Alcohol abuse, uncomplicated: Secondary | ICD-10-CM | POA: Diagnosis present

## 2021-02-09 DIAGNOSIS — Z23 Encounter for immunization: Secondary | ICD-10-CM | POA: Insufficient documentation

## 2021-02-09 DIAGNOSIS — Z7901 Long term (current) use of anticoagulants: Secondary | ICD-10-CM | POA: Insufficient documentation

## 2021-02-09 DIAGNOSIS — F10239 Alcohol dependence with withdrawal, unspecified: Principal | ICD-10-CM | POA: Insufficient documentation

## 2021-02-09 DIAGNOSIS — I2510 Atherosclerotic heart disease of native coronary artery without angina pectoris: Secondary | ICD-10-CM | POA: Diagnosis not present

## 2021-02-09 DIAGNOSIS — Y900 Blood alcohol level of less than 20 mg/100 ml: Secondary | ICD-10-CM | POA: Insufficient documentation

## 2021-02-09 DIAGNOSIS — K2900 Acute gastritis without bleeding: Secondary | ICD-10-CM

## 2021-02-09 DIAGNOSIS — I10 Essential (primary) hypertension: Secondary | ICD-10-CM | POA: Diagnosis present

## 2021-02-09 DIAGNOSIS — R0789 Other chest pain: Secondary | ICD-10-CM | POA: Diagnosis not present

## 2021-02-09 DIAGNOSIS — R079 Chest pain, unspecified: Secondary | ICD-10-CM | POA: Diagnosis not present

## 2021-02-09 DIAGNOSIS — K279 Peptic ulcer, site unspecified, unspecified as acute or chronic, without hemorrhage or perforation: Secondary | ICD-10-CM | POA: Diagnosis present

## 2021-02-09 DIAGNOSIS — K219 Gastro-esophageal reflux disease without esophagitis: Secondary | ICD-10-CM | POA: Diagnosis present

## 2021-02-09 DIAGNOSIS — K760 Fatty (change of) liver, not elsewhere classified: Secondary | ICD-10-CM | POA: Diagnosis not present

## 2021-02-09 LAB — CBC WITH DIFFERENTIAL/PLATELET
Abs Immature Granulocytes: 0.03 10*3/uL (ref 0.00–0.07)
Basophils Absolute: 0.1 10*3/uL (ref 0.0–0.1)
Basophils Relative: 1 %
Eosinophils Absolute: 0 10*3/uL (ref 0.0–0.5)
Eosinophils Relative: 1 %
HCT: 45.6 % (ref 39.0–52.0)
Hemoglobin: 16 g/dL (ref 13.0–17.0)
Immature Granulocytes: 0 %
Lymphocytes Relative: 20 %
Lymphs Abs: 1.5 10*3/uL (ref 0.7–4.0)
MCH: 32.7 pg (ref 26.0–34.0)
MCHC: 35.1 g/dL (ref 30.0–36.0)
MCV: 93.3 fL (ref 80.0–100.0)
Monocytes Absolute: 0.9 10*3/uL (ref 0.1–1.0)
Monocytes Relative: 12 %
Neutro Abs: 4.9 10*3/uL (ref 1.7–7.7)
Neutrophils Relative %: 66 %
Platelets: 235 10*3/uL (ref 150–400)
RBC: 4.89 MIL/uL (ref 4.22–5.81)
RDW: 11.9 % (ref 11.5–15.5)
WBC: 7.4 10*3/uL (ref 4.0–10.5)
nRBC: 0 % (ref 0.0–0.2)

## 2021-02-09 LAB — LIPASE, BLOOD: Lipase: 103 U/L — ABNORMAL HIGH (ref 11–51)

## 2021-02-09 LAB — COMPREHENSIVE METABOLIC PANEL
ALT: 77 U/L — ABNORMAL HIGH (ref 0–44)
AST: 74 U/L — ABNORMAL HIGH (ref 15–41)
Albumin: 4.7 g/dL (ref 3.5–5.0)
Alkaline Phosphatase: 83 U/L (ref 38–126)
Anion gap: 14 (ref 5–15)
BUN: 18 mg/dL (ref 6–20)
CO2: 22 mmol/L (ref 22–32)
Calcium: 9.7 mg/dL (ref 8.9–10.3)
Chloride: 95 mmol/L — ABNORMAL LOW (ref 98–111)
Creatinine, Ser: 1.21 mg/dL (ref 0.61–1.24)
GFR, Estimated: >60 mL/min (ref >60)
Glucose, Bld: 125 mg/dL — ABNORMAL HIGH (ref 70–99)
Potassium: 3.7 mmol/L (ref 3.5–5.1)
Sodium: 131 mmol/L — ABNORMAL LOW (ref 135–145)
Total Bilirubin: 2.2 mg/dL — ABNORMAL HIGH (ref 0.3–1.2)
Total Protein: 8.3 g/dL — ABNORMAL HIGH (ref 6.5–8.1)

## 2021-02-09 LAB — RESP PANEL BY RT-PCR (FLU A&B, COVID) ARPGX2
Influenza A by PCR: NEGATIVE
Influenza B by PCR: NEGATIVE
SARS Coronavirus 2 by RT PCR: NEGATIVE

## 2021-02-09 LAB — MAGNESIUM: Magnesium: 1.6 mg/dL — ABNORMAL LOW (ref 1.7–2.4)

## 2021-02-09 LAB — TROPONIN I (HIGH SENSITIVITY)
Troponin I (High Sensitivity): 34 ng/L — ABNORMAL HIGH (ref <18)
Troponin I (High Sensitivity): 31 ng/L — ABNORMAL HIGH (ref <18)

## 2021-02-09 LAB — ETHANOL: Alcohol, Ethyl (B): 11 mg/dL — ABNORMAL HIGH (ref <10)

## 2021-02-09 MED ORDER — SUCRALFATE 1 G PO TABS
1.0000 g | ORAL_TABLET | Freq: Three times a day (TID) | ORAL | Status: DC
  Administered 2021-02-09 – 2021-02-10 (×2): 1 g via ORAL
  Filled 2021-02-09 (×2): qty 1

## 2021-02-09 MED ORDER — ACETAMINOPHEN 650 MG RE SUPP: 650.0000 mg | Freq: Four times a day (QID) | RECTAL | Status: DC | PRN

## 2021-02-09 MED ORDER — SODIUM CHLORIDE 0.9 % IV BOLUS
1000.0000 mL | Freq: Once | INTRAVENOUS | Status: AC
  Administered 2021-02-09: 1000 mL via INTRAVENOUS

## 2021-02-09 MED ORDER — POLYETHYLENE GLYCOL 3350 17 G PO PACK: 17.0000 g | PACK | Freq: Every day | ORAL | Status: DC | PRN

## 2021-02-09 MED ORDER — M.V.I. ADULT IV INJ
INJECTION | INTRAVENOUS | Status: AC
  Filled 2021-02-09: qty 10

## 2021-02-09 MED ORDER — NICOTINE 21 MG/24HR TD PT24
21.0000 mg | MEDICATED_PATCH | Freq: Every day | TRANSDERMAL | Status: DC
  Administered 2021-02-09 – 2021-02-10 (×2): 21 mg via TRANSDERMAL
  Filled 2021-02-09 (×2): qty 1

## 2021-02-09 MED ORDER — FOLIC ACID 1 MG PO TABS
1.0000 mg | ORAL_TABLET | Freq: Every day | ORAL | Status: DC
  Administered 2021-02-10: 1 mg via ORAL
  Filled 2021-02-09: qty 1

## 2021-02-09 MED ORDER — FOLIC ACID 5 MG/ML IJ SOLN
INTRAMUSCULAR | Status: AC
  Filled 2021-02-09: qty 0.2

## 2021-02-09 MED ORDER — OXYCODONE HCL 5 MG PO TABS
10.0000 mg | ORAL_TABLET | Freq: Four times a day (QID) | ORAL | Status: DC | PRN
  Administered 2021-02-09 – 2021-02-10 (×4): 10 mg via ORAL
  Filled 2021-02-09 (×5): qty 2

## 2021-02-09 MED ORDER — SODIUM CHLORIDE 0.9% FLUSH
3.0000 mL | Freq: Two times a day (BID) | INTRAVENOUS | Status: DC
  Administered 2021-02-09: 3 mL via INTRAVENOUS

## 2021-02-09 MED ORDER — CARVEDILOL 12.5 MG PO TABS
25.0000 mg | ORAL_TABLET | Freq: Two times a day (BID) | ORAL | Status: DC
  Administered 2021-02-09 – 2021-02-10 (×2): 25 mg via ORAL
  Filled 2021-02-09 (×2): qty 2

## 2021-02-09 MED ORDER — PANTOPRAZOLE SODIUM 20 MG PO TBEC: 20.0000 mg | DELAYED_RELEASE_TABLET | Freq: Every day | ORAL | 1 refills | Status: DC

## 2021-02-09 MED ORDER — HEPARIN SODIUM (PORCINE) 5000 UNIT/ML IJ SOLN
5000.0000 [IU] | Freq: Three times a day (TID) | INTRAMUSCULAR | Status: DC
  Administered 2021-02-09 – 2021-02-10 (×2): 5000 [IU] via SUBCUTANEOUS
  Filled 2021-02-09 (×2): qty 1

## 2021-02-09 MED ORDER — LORAZEPAM 1 MG PO TABS
0.0000 mg | ORAL_TABLET | Freq: Four times a day (QID) | ORAL | Status: DC
  Filled 2021-02-09: qty 2

## 2021-02-09 MED ORDER — THIAMINE HCL 100 MG PO TABS
100.0000 mg | ORAL_TABLET | Freq: Every day | ORAL | Status: DC
  Administered 2021-02-10: 100 mg via ORAL
  Filled 2021-02-09: qty 1

## 2021-02-09 MED ORDER — LORAZEPAM 2 MG/ML IJ SOLN
2.0000 mg | Freq: Once | INTRAMUSCULAR | Status: AC
  Administered 2021-02-09: 2 mg via INTRAVENOUS
  Filled 2021-02-09: qty 1

## 2021-02-09 MED ORDER — SODIUM CHLORIDE 0.9 % IV SOLN: 250.0000 mL | INTRAVENOUS | Status: DC | PRN

## 2021-02-09 MED ORDER — LORAZEPAM 2 MG/ML IJ SOLN: 0.0000 mg | Freq: Two times a day (BID) | INTRAMUSCULAR | Status: DC

## 2021-02-09 MED ORDER — PANTOPRAZOLE SODIUM 40 MG IV SOLR
80.0000 mg | Freq: Once | INTRAVENOUS | Status: AC
  Administered 2021-02-09: 80 mg via INTRAVENOUS
  Filled 2021-02-09: qty 80

## 2021-02-09 MED ORDER — PANTOPRAZOLE SODIUM 40 MG IV SOLR
40.0000 mg | Freq: Two times a day (BID) | INTRAVENOUS | Status: DC
  Administered 2021-02-10: 40 mg via INTRAVENOUS
  Filled 2021-02-09: qty 40

## 2021-02-09 MED ORDER — THIAMINE HCL 100 MG/ML IJ SOLN
INTRAVENOUS | Status: AC
  Filled 2021-02-09: qty 1000

## 2021-02-09 MED ORDER — SODIUM CHLORIDE 0.9% FLUSH: 3.0000 mL | INTRAVENOUS | Status: DC | PRN

## 2021-02-09 MED ORDER — LORAZEPAM 1 MG PO TABS: 0.0000 mg | ORAL_TABLET | Freq: Two times a day (BID) | ORAL | Status: DC

## 2021-02-09 MED ORDER — MAGNESIUM SULFATE 2 GM/50ML IV SOLN
2.0000 g | Freq: Once | INTRAVENOUS | Status: AC
  Administered 2021-02-09: 2 g via INTRAVENOUS
  Filled 2021-02-09: qty 50

## 2021-02-09 MED ORDER — LORAZEPAM 2 MG/ML IJ SOLN: 2.0000 mg | Freq: Once | INTRAMUSCULAR | Status: DC

## 2021-02-09 MED ORDER — LORAZEPAM 2 MG/ML IJ SOLN
0.0000 mg | Freq: Four times a day (QID) | INTRAMUSCULAR | Status: DC
  Administered 2021-02-09 – 2021-02-10 (×5): 2 mg via INTRAVENOUS
  Filled 2021-02-09 (×5): qty 1

## 2021-02-09 MED ORDER — THIAMINE HCL 100 MG/ML IJ SOLN
100.0000 mg | Freq: Every day | INTRAMUSCULAR | Status: DC
  Administered 2021-02-09: 100 mg via INTRAVENOUS
  Filled 2021-02-09: qty 2

## 2021-02-09 MED ORDER — LOSARTAN POTASSIUM 50 MG PO TABS
25.0000 mg | ORAL_TABLET | Freq: Every day | ORAL | Status: DC
  Administered 2021-02-10: 25 mg via ORAL
  Filled 2021-02-09: qty 1

## 2021-02-09 MED ORDER — ONDANSETRON HCL 4 MG/2ML IJ SOLN
4.0000 mg | Freq: Four times a day (QID) | INTRAMUSCULAR | Status: DC | PRN
  Administered 2021-02-09 – 2021-02-10 (×2): 4 mg via INTRAVENOUS
  Filled 2021-02-09 (×2): qty 2

## 2021-02-09 MED ORDER — BUSPIRONE HCL 5 MG PO TABS
10.0000 mg | ORAL_TABLET | Freq: Three times a day (TID) | ORAL | Status: DC
  Administered 2021-02-09 – 2021-02-10 (×3): 10 mg via ORAL
  Filled 2021-02-09 (×3): qty 2

## 2021-02-09 MED ORDER — ALBUTEROL SULFATE (2.5 MG/3ML) 0.083% IN NEBU: 3.0000 mL | INHALATION_SOLUTION | RESPIRATORY_TRACT | Status: DC | PRN

## 2021-02-09 MED ORDER — THIAMINE HCL 100 MG/ML IJ SOLN
INTRAMUSCULAR | Status: AC
  Filled 2021-02-09: qty 2

## 2021-02-09 MED ORDER — TRAZODONE HCL 50 MG PO TABS
150.0000 mg | ORAL_TABLET | Freq: Every day | ORAL | Status: DC
  Administered 2021-02-09: 150 mg via ORAL
  Filled 2021-02-09: qty 3

## 2021-02-09 MED ORDER — VITAMIN D 25 MCG (1000 UNIT) PO TABS
2000.0000 [IU] | ORAL_TABLET | Freq: Every day | ORAL | Status: DC
  Administered 2021-02-10: 2000 [IU] via ORAL
  Filled 2021-02-09: qty 2

## 2021-02-09 MED ORDER — DIAZEPAM 2 MG PO TABS
2.0000 mg | ORAL_TABLET | Freq: Three times a day (TID) | ORAL | Status: DC
  Administered 2021-02-09 – 2021-02-10 (×3): 2 mg via ORAL
  Filled 2021-02-09 (×3): qty 1

## 2021-02-09 MED ORDER — CLONIDINE HCL 0.3 MG/24HR TD PTWK
0.3000 mg | MEDICATED_PATCH | TRANSDERMAL | Status: DC
  Filled 2021-02-09: qty 1

## 2021-02-09 MED ORDER — ADULT MULTIVITAMIN W/MINERALS CH
1.0000 | ORAL_TABLET | Freq: Every day | ORAL | Status: DC
  Administered 2021-02-10: 1 via ORAL
  Filled 2021-02-09: qty 1

## 2021-02-09 MED ORDER — LORAZEPAM 2 MG/ML IJ SOLN
1.0000 mg | Freq: Once | INTRAMUSCULAR | Status: AC
  Administered 2021-02-09: 1 mg via INTRAVENOUS
  Filled 2021-02-09: qty 1

## 2021-02-09 MED ORDER — ONDANSETRON HCL 4 MG PO TABS: 4.0000 mg | ORAL_TABLET | Freq: Four times a day (QID) | ORAL | Status: DC | PRN

## 2021-02-09 MED ORDER — ACETAMINOPHEN 325 MG PO TABS
650.0000 mg | ORAL_TABLET | Freq: Four times a day (QID) | ORAL | Status: DC | PRN
  Administered 2021-02-10: 650 mg via ORAL
  Filled 2021-02-09: qty 2

## 2021-02-09 MED ORDER — CHLORDIAZEPOXIDE HCL 25 MG PO CAPS: 50.0000 mg | ORAL_CAPSULE | Freq: Three times a day (TID) | ORAL | 0 refills | Status: DC | PRN

## 2021-02-09 MED ORDER — SPIRONOLACTONE 25 MG PO TABS
25.0000 mg | ORAL_TABLET | Freq: Every day | ORAL | Status: DC
  Administered 2021-02-10: 25 mg via ORAL
  Filled 2021-02-09: qty 1

## 2021-02-09 MED ORDER — HYDROXYZINE HCL 25 MG PO TABS
25.0000 mg | ORAL_TABLET | Freq: Three times a day (TID) | ORAL | Status: DC | PRN
  Administered 2021-02-10: 25 mg via ORAL
  Filled 2021-02-09: qty 1

## 2021-02-09 MED ORDER — ATORVASTATIN CALCIUM 10 MG PO TABS
10.0000 mg | ORAL_TABLET | Freq: Every day | ORAL | Status: DC
  Administered 2021-02-09: 10 mg via ORAL
  Filled 2021-02-09: qty 1

## 2021-02-09 MED ORDER — BISACODYL 10 MG RE SUPP: 10.0000 mg | Freq: Every day | RECTAL | Status: DC | PRN

## 2021-02-09 NOTE — Discharge Instructions (Addendum)
. Your testing was reassuring . Please avoid alcohol as this can make the pain worse . Prescribed a medicine called Librium which will help you to get off of alcohol, please take it exactly as prescribed . Please avoid eating fatty foods or foods that are fried as this can make it worse . Please avoid taking ibuprofen Advil or Aleve or any aspirin containing medications as that can make it worse also . If you develop severe or worsening symptoms return to the emergency department immediately  See the list below for resources for helping with alcohol problems  Substance Abuse Treatment Programs  Intensive Outpatient Programs [facility] [place]     [place]. , [place]       [facility] [address] [place], [place]  [facility]     (Inpatient and outpatient)     [address]           [place] [phone] (Suboxone and Methadone)  [place], [place] [address]      [place] [address] [place], [place]  [facility] (Outpatient/Inpatient, [place])    (insurance only) [phone]             [facility] ([place]) [place], [place]     [facility]     [address]     [place], [place]       [facility] (for caregivers and family) [phone] [address] [place]een [place]. [place], [place]      Residential Treatment Programs [facility]      [address], [place], [place] [address]  [phone]       [place], [place] [address] [phone]  [facility] [place]        [phone]       [facility] [phone]  [facility] ([place]             [place], [place] or [place] [address] [place], [address] [phone]  [facility] [place]    [address]      [place], [place]       [facility] [address] [place], [place]  [place]   [address] [place], [place] [address]     [phone]  Admissions: 8am-3pm M-F  Residential Treatment Services (RTS) [place], [place]  [facility] Program: Residential Program ([address]   [place], [place] or [phone]     [facility]: [place], [place] (Walk in Hours over the weekend or by referral)  [facility] [place], [place], [place] [address] [phone]  Crisis Mobile: [facility]:  [phone] (for crisis response 24 hours a day) [place]:      [phone]    IMPORTANT INFORMATION: PAY CLOSE ATTENTION   PHYSICIAN DISCHARGE INSTRUCTIONS  Follow with Primary care provider  Redmond School, MD  and other consultants as instructed by your Hospitalist Physician  [place] IF SYMPTOMS COME BACK, WORSEN OR NEW PROBLEM DEVELOPS   Please note: You were cared for by a hospitalist during your hospital stay. Every effort will be made to forward records to your primary care provider.  You can request that your primary care provider send for your hospital records if they have not received them.  Once you are discharged, your primary care physician will handle any further medical issues. Please note that NO REFILLS for any discharge medications will be authorized once you are discharged, as it is imperative that you return to your primary care physician (or establish a relationship with a primary care physician if you do not have one)  for your post hospital discharge needs so that they can reassess your need for medications and monitor your lab values.  Please get a complete blood count and chemistry panel checked by your Primary MD at your next visit, and again as instructed by your Primary MD.  Get Medicines reviewed and adjusted: Please take all your medications with you for your next visit with your Primary MD  Laboratory/radiological data: Please request your Primary MD to go over all hospital tests and procedure/radiological results at the follow up, please ask your primary care provider to get all Hospital records sent to his/her office.  In some cases, they will be blood work, cultures and biopsy results pending at the time of your discharge. Please request that your primary care provider follow up on these results.  If you are diabetic, please bring your blood sugar readings with you to your follow up appointment with primary care.    Please call and make your follow up appointments as soon as possible.    Also Note the following: If you experience worsening of your admission symptoms, develop shortness of breath, life threatening emergency, suicidal or homicidal thoughts you must seek medical attention immediately by calling 911 or calling your MD immediately  if symptoms less severe.  You must read complete instructions/literature along with all the possible adverse reactions/side effects for all the Medicines you take and that have been prescribed to you. Take any new Medicines after you have completely understood and accpet all the possible adverse reactions/side effects.   Do not drive when taking Pain medications or sleeping medications (Benzodiazepines)  Do not take more than prescribed Pain, Sleep and Anxiety Medications. It is not advisable to combine anxiety,sleep and pain medications without talking with your primary care practitioner  Special Instructions: If you have smoked or chewed Tobacco  in the  last 2 yrs please stop smoking, stop any regular Alcohol  and or any Recreational drug use.  Wear Seat belts while driving.  Do not drive if taking any narcotic, mind altering or controlled  substances or recreational drugs or alcohol.

## 2021-02-09 NOTE — H&P (Addendum)
Patient Demographics:    Timothy [name], is a 50 y.o. male  MRN: [id number]   DOB - 26-May-1971  Admit Date - 02/09/2021  Outpatient Primary MD for the patient is Redmond School, MD   Assessment & Plan:    Principal Problem:   Alcohol withdrawal/DTs Active Problems:   PUD (peptic ulcer disease)/Gastric Ulcers---etoh and Nsaid related   Esophagitis-etoh and NSAID related   Barrett's esophagus without dysplasia   Essential hypertension   CAD (coronary artery disease)   History of Wolff-Parkinson-White syndrome   Tobacco abuse   GERD (gastroesophageal reflux disease)   ETOH abuse   Chronic combined systolic (congestive) and diastolic (congestive) heart failure (HCC)   A/p 1)Nausea, Vomiting and Abdominal Pain/PUD-- -Barrett's esophagus, GERD, alcoholic gastritis and PUD with gastric ulcers-- in the setting of alcohol abuse, -s/p  EGD on 12/20/2020 showed gastric ulcers as well as esophagitis/gastritis with biopsy/pathology confirming Barrett's esophagus -CT abdomen and pelvis with fatty liver otherwise no acute findings -Lipase is 103,  but pancreas is unremarkable on CT abdomen and pelvis --IV Protonix and p.o. Carafate as ordered -Liquid diet for now may advance diet as tolerated -Antiemetics and pain control as ordered -Hold off on further GI consult unless H&H drops significantly  2)Alcohol withdrawal/DTs --patient is a heavy drinker, his wife drinks also - tachycardia, tachypnea, elevated BP and significant tremors with elevated CIWA score persisted despite IV lorazepam-- BAL is 11  Patient remained tachycardic with elevated CIWA score -Continue aggressive CIWA protocol -IV banana bag as ordered -Thiamine and folic acid as ordered -We will avoid IV Precedex drip if possible given low EF would  prefer lorazepam or Versed drip if needed  3)Chronic combined systolic and diastolic dysfunction CHF/Prior history of coronary disease (50% stenosis small diagonal)--  --recent echo with EF of 45 to 50% with grade 1 diastolic dysfunction --Troponins 34 repeat troponin 31, EKG sinus rhythm with right bundle branch block -LHC 2017with mild to moderate nonobstructive CAD -Myoview from 08/04/2020 without reversible ischemia -Continue Coreg, losartan and Aldactone  4)Hypokalemia/Hypochloremia ----Sodium is 131, chloride is 95 in the setting of vomiting and alcohol abuse IV fluids as ordered, check serum mag  5)Transaminitis/Fatty Liver-- T bili is 2.2 with AST of 74 and AST of 77, CT abdomen and pelvis with fatty liver -Liver ultrasound pending in a.m.  6)H/o WPW---history ablation x 3 at [facility]--continue Coreg  7)Anxiety/Insomnia----trazodone, hydroxyzine and BuSpar , abstinence from alcohol advised  8)Tobacco abuse--- nicotine patch as ordered  9)HTN--continue clonidine patch, Coreg, Aldactone and Losartan  Disposition/Need for in-Hospital Stay- patient unable to be discharged at this time due to persistent DT symptoms with persistent tachycardia and tachypnea requiring IV lorazepam and IV fluids, also persistent emesis requiring IV antiemetics and IV fluids pending better tolerance of oral intake  Dispo: The patient is from: Home              Anticipated d/c is to: Home  Anticipated d/c date is: 1 day              Patient currently is not medically stable to d/c. Barriers: Not Clinically Stable-   With History of - Reviewed by me  Past Medical History:  Diagnosis Date  . Anxiety   . Arthritis   . CAD (coronary artery disease) 2007   50% stenosis small diagonal  . Central sleep apnea   . Chronic pain    since 1998 has been on chronic pain medication  . Fatty liver   . GERD (gastroesophageal reflux disease)   . Hemorrhoids   . Hepatomegaly 10/03/2014  . HTN  (hypertension)   . Palpitations   . Syncope   . Tachycardia   . Tobacco abuse   . Tubular adenoma   . Wolff-Parkinson-White (WPW) syndrome    says was cured with ablation      Past Surgical History:  Procedure Laterality Date  . ADENOIDECTOMY    . APPENDECTOMY    . ATRIAL ABLATION SURGERY    . AV NODE ABLATION    . BIOPSY N/A 05/12/2013   Procedure: BIOPSY;  Surgeon: Daneil Dolin, MD;  Location: [facility];  Service: Endoscopy;  Laterality: N/A;  . BIOPSY  12/20/2020   Procedure: BIOPSY;  Surgeon: Daneil Dolin, MD;  [facility];  Service: Endoscopy;;  . CARDIAC CATHETERIZATION N/A 05/08/2016   Procedure: Right/Left Heart Cath and Coronary Angiography;  Surgeon: Peter M Martinique, MD;  Location: [place] [facility];  Service: Cardiovascular;  Laterality: N/A;  . CARDIAC ELECTROPHYSIOLOGY STUDY AND ABLATION     SA node ablation  . COLONOSCOPY     age 74  . COLONOSCOPY WITH PROPOFOL N/A 05/12/2013   Dr. Gala Romney- hemorrhoids, tubular adenoma  . ESOPHAGEAL DILATION  12/20/2020   Procedure: ESOPHAGEAL DILATION;  Surgeon: Daneil Dolin, MD;  [facility];  Service: Endoscopy;;  . ESOPHAGEAL DILATION N/A 12/20/2020   Procedure: ESOPHAGEAL DILATION;  Surgeon: Daneil Dolin, MD;  [facility];  Service: Endoscopy;  Laterality: N/A;  . ESOPHAGOGASTRODUODENOSCOPY (EGD) WITH PROPOFOL N/A 05/12/2013   Dr. Gala Romney- hiatal hernia, erosive reflux esophagitis  . ESOPHAGOGASTRODUODENOSCOPY (EGD) WITH PROPOFOL N/A 12/20/2020   Procedure: ESOPHAGOGASTRODUODENOSCOPY (EGD) WITH PROPOFOL;  Surgeon: Daneil Dolin, MD;  [facility];  Service: Endoscopy;  Laterality: N/A;  . HAND SURGERY    . HEMORRHOID BANDING  06/22/13  . POLYPECTOMY N/A 05/12/2013   Procedure: POLYPECTOMY;  Surgeon: Daneil Dolin, MD;  Location: [facility];  Service: Endoscopy;  Laterality: N/A;  . SHOULDER SURGERY      Chief Complaint  Patient presents with  . Chest Pain      HPI:    Timothy [name]  is a 50 y.o. male  with medical history significant for Barrett's esophagus, GERD, alcoholic gastritis and PUD with gastric ulcers, HTN, tobacco abuse, prior history of Wolff-Parkinson-White syndrome, fatty liver disease, chronic combined CHF,alcohol abuse with heavy use (ongoing), prior history of coronary disease (50% stenosis small diagonal)--with nausea vomiting and epigastric pain after excessive drinking of vodka-- -denies hematemesis or melena this time around -On further questioning patient specifically denies chest pains or pleuritic symptoms -Patient was found to have tachycardia, tachypnea elevated BP and significant tremors with elevated CIWA score required IV lorazepam--despite these measures---  Patient remained tachycardic with elevated CIWA score -Chest x-ray without acute cardiopulmonary finding, COVID negative -CT abdomen and pelvis without acute findings patient does have fatty  liver -Sodium is 131, chloride is 95 creatinine 1.21, T bili is 2.2 with AST of 74 and AST of 77 -BAL 11, CBC WNL -Lipase is 103 -Troponins 34 repeat troponin 31, EKG sinus rhythm with right bundle branch block   Review of systems:    In addition to the HPI above,   A full Review of  Systems was done, all other systems reviewed are negative except as noted above in HPI , .    Social History:  Reviewed by me    Social History   Tobacco Use  . Smoking status: Current Every Day Smoker    Packs/day: 0.50    Years: 25.00    Pack years: 12.50    Types: Cigarettes  . Smokeless tobacco: Never Used  Substance Use Topics  . Alcohol use: Yes    Comment: daily; 12/18/20-5 days/week, 6 drinks at time     Family History :  Reviewed by me    Family History  Problem Relation Age of Onset  . Coronary artery disease Father        cabg  . Colon cancer Father        age 70, chemo/surgery  . Breast cancer Mother   . Hypertension Mother      Home Medications:   Prior to Admission  medications   Medication Sig Start Date End Date Taking? Authorizing Provider  chlordiazePOXIDE (LIBRIUM) 25 MG capsule Take 2 capsules (50 mg total) by mouth 3 (three) times daily as needed for anxiety. 50mg  by mouth q 6 h day 1 50mg  by mouth q 8 h day 2 50mg  by mouth q 12 h day 3 50mg  by mough QHS day 4 02/09/21  Yes Noemi Chapel, MD  pantoprazole (PROTONIX) 20 MG tablet Take 1 tablet (20 mg total) by mouth daily. 02/09/21  Yes Noemi Chapel, MD  acetaminophen (TYLENOL) 325 MG tablet Take 2 tablets (650 mg total) by mouth every 4 (four) hours as needed for mild pain, fever or headache. 12/20/20   Roxan Hockey, MD  atorvastatin (LIPITOR) 10 MG tablet Take 1 tablet (10 mg total) by mouth at bedtime. 12/20/20   Roxan Hockey, MD  busPIRone (BUSPAR) 10 MG tablet Take 1 tablet (10 mg total) by mouth 3 (three) times daily. For anxiety 12/20/20   Roxan Hockey, MD  carvedilol (COREG) 25 MG tablet Take 1 tablet (25 mg total) by mouth 2 (two) times daily with a meal. 12/20/20   Masiah Lewing, MD  Cholecalciferol (VITAMIN D3) 25 MCG (1000 UT) CAPS Take 2 capsules by mouth daily.    [provider]  cloNIDine (CATAPRES - DOSED IN MG/24 HR) 0.3 mg/24hr patch Place 1 patch (0.3 mg total) onto the skin once a week. 12/20/20   Roxan Hockey, MD  cloNIDine (CATAPRES) 0.2 MG tablet Take 1 tablet (0.2 mg total) by mouth 2 (two) times daily as needed (BP >150/90). 12/20/20   Roxan Hockey, MD  folic acid (FOLVITE) 1 MG tablet Take 1 tablet (1 mg total) by mouth daily. 12/21/20   Roxan Hockey, MD  furosemide (LASIX) 40 MG tablet Take 1 tablet (40 mg total) by mouth daily. 12/20/20   Roxan Hockey, MD  hydrOXYzine (ATARAX/VISTARIL) 25 MG tablet Take 1 tablet (25 mg total) by mouth 3 (three) times daily as needed for anxiety or nausea. 12/20/20   Roxan Hockey, MD  losartan (COZAAR) 25 MG tablet Take 1 tablet (25 mg total) by mouth daily. 12/21/20   Roxan Hockey, MD  Multiple Vitamin (MULTIVITAMIN  WITH MINERALS) TABS tablet Take 1 tablet by mouth daily. 12/21/20   Roxan Hockey, MD  nicotine (NICODERM CQ - DOSED IN MG/24 HOURS) 14 mg/24hr patch Place 1 patch (14 mg total) onto the skin daily. 12/21/20   Roxan Hockey, MD  oxyCODONE (ROXICODONE) 15 MG immediate release tablet Take 15 mg by mouth every 4 (four) hours as needed for pain. 03/12/16   [provider]  spironolactone (ALDACTONE) 25 MG tablet Take 1 tablet (25 mg total) by mouth every morning. 12/20/20 03/20/21  Roxan Hockey, MD  thiamine 100 MG tablet Take 1 tablet (100 mg total) by mouth daily. 12/21/20   Roxan Hockey, MD  traZODone (DESYREL) 150 MG tablet Take 1 tablet (150 mg total) by mouth at bedtime. For Sleep and Anxiety 12/20/20   Roxan Hockey, MD  VENTOLIN HFA 108 (90 Base) MCG/ACT inhaler Inhale 2 puffs into the lungs every 4 (four) hours as needed for wheezing or shortness of breath. 12/20/20   Roxan Hockey, MD  dexlansoprazole (DEXILANT) 60 MG capsule Take 1 capsule (60 mg total) by mouth daily. 12/25/20 02/09/21  Daneil Dolin, MD     Allergies:     Allergies  Allergen Reactions  . Morphine Other (See Comments)    Makes overly sleepy     Physical Exam:   Vitals  Blood pressure (!) 120/100, pulse (!) 105, temperature 97.8 F (36.6 C), temperature source Oral, resp. rate 20, height 6' (1.829 m), weight 104.3 kg, SpO2 96 %.   Temp:  [97.8 F (36.6 C)] 97.8 F (36.6 C) (05/28 1118) Pulse Rate:  [91-114] 105 (05/28 2040) Resp:  [13-32] 20 (05/28 1945) BP: (105-176)/(85-152) 120/100 (05/28 2040) SpO2:  [92 %-99 %] 96 % (05/28 1945) Weight:  [104.3 kg] 104.3 kg (05/28 1123)   Physical Examination: General appearance - alert, and in no distress  Mental status - alert, oriented to person, place, and time,  Eyes - sclera icterus Neck - supple, no JVD elevation , Chest - clear  to auscultation bilaterally, symmetrical air movement,  Heart - S1 and S2 normal, regular, tachycardic Abdomen -  soft, epigastric and right upper quadrant area tenderness without rebound or guarding nondistended, increased truncal adiposity Neurological - screening mental status exam normal, neck supple without rigidity, cranial nerves II through XII intact, DTR's normal and symmetric -Significant tremors Extremities - no pedal edema noted, intact peripheral pulses Skin - warm, dry    Data Review:    CBC Recent Labs  Lab 02/09/21 1122  WBC 7.4  HGB 16.0  HCT 45.6  PLT 235  MCV 93.3  MCH 32.7  MCHC 35.1  RDW 11.9  LYMPHSABS 1.5  MONOABS 0.9  EOSABS 0.0  BASOSABS 0.1    Chemistries  Recent Labs  Lab 02/09/21 1122  NA 131*  K 3.7  CL 95*  CO2 22  GLUCOSE 125*  BUN 18  CREATININE 1.21  CALCIUM 9.7  AST 74*  ALT 77*  ALKPHOS 83  BILITOT 2.2*   estimated creatinine clearance is 90.2 mL/min (by C-G formula based on SCr of 1.21 mg/dL). ------------------------------------------------------------------------------------------------------------------ No results for input(s): TSH, T4TOTAL, T3FREE, THYROIDAB in the last 72 hours.  Invalid input(s): FREET3  Cardiac Enzymes No results for input(s): CKMB, TROPONINI, MYOGLOBIN in the last 168 hours.  Invalid input(s): CK     Component Value Date/Time   BNP 849.0 (H) 12/18/2020 1500   Urinalysis    Component Value Date/Time   COLORURINE YELLOW 04/11/2016 [place] 04/11/2016  1044   LABSPEC 1.011 04/11/2016 1044   PHURINE 7.5 04/11/2016 1044   GLUCOSEU NEGATIVE 04/11/2016 1044   HGBUR NEGATIVE 04/11/2016 1044   BILIRUBINUR NEGATIVE 04/11/2016 1044   KETONESUR NEGATIVE 04/11/2016 1044   PROTEINUR NEGATIVE 04/11/2016 1044   UROBILINOGEN 0.2 10/29/2014 0937   NITRITE NEGATIVE 04/11/2016 1044   LEUKOCYTESUR NEGATIVE 04/11/2016 1044     Imaging Results:    CT ABDOMEN PELVIS WO CONTRAST  Result Date: 02/09/2021 CLINICAL DATA:  Nausea and vomiting with chest pain, initial encounter EXAM: CT ABDOMEN AND  PELVIS WITHOUT CONTRAST TECHNIQUE: Multidetector CT imaging of the abdomen and pelvis was performed following the standard protocol without IV contrast. COMPARISON:  03/07/2013 FINDINGS: Lower chest: No acute abnormality. Hepatobiliary: Fatty infiltration of the liver is noted. The gallbladder is within normal limits. No biliary ductal dilatation is seen. Pancreas: Unremarkable. No pancreatic ductal dilatation or surrounding inflammatory changes. Spleen: Normal in size without focal abnormality. Adrenals/Urinary Tract: Adrenal glands are within normal limits. Kidneys are well visualized bilaterally. No renal calculi or obstructive changes are noted. A cyst is noted within the left kidney stable from the prior exam. Bladder is within normal limits. Stomach/Bowel: Appendix is not visualized consistent with a prior surgical history. No obstructive or inflammatory changes of the colon are seen. Stomach and small bowel are within normal limits. Vascular/Lymphatic: Aortic atherosclerosis. No enlarged abdominal or pelvic lymph nodes. Reproductive: Prostate is unremarkable. Other: No abdominal wall hernia or abnormality. No abdominopelvic ascites. Musculoskeletal: Degenerative changes of lumbar spine are noted. IMPRESSION: Fatty liver. Left renal cyst. No acute abnormality is noted to correspond with the patient's given clinical history. Electronically Signed   By: Inez Catalina M.D.   On: 02/09/2021 15:23   DG Chest Portable 1 View  Result Date: 02/09/2021 CLINICAL DATA:  chest pain EXAM: PORTABLE CHEST - 1 VIEW COMPARISON:  12/18/2020 FINDINGS: The left costophrenic angle is excluded from the study. The mediastinal contours are within normal limits. No cardiomegaly. The lungs are clear bilaterally without evidence of focal consolidation, pleural effusion, or pneumothorax. No acute osseous abnormality. IMPRESSION: No acute cardiopulmonary process. Electronically Signed   By: Ruthann Cancer MD   On: 02/09/2021 11:40    Radiological Exams on Admission: CT ABDOMEN PELVIS WO CONTRAST  Result Date: 02/09/2021 CLINICAL DATA:  Nausea and vomiting with chest pain, initial encounter EXAM: CT ABDOMEN AND PELVIS WITHOUT CONTRAST TECHNIQUE: Multidetector CT imaging of the abdomen and pelvis was performed following the standard protocol without IV contrast. COMPARISON:  03/07/2013 FINDINGS: Lower chest: No acute abnormality. Hepatobiliary: Fatty infiltration of the liver is noted. The gallbladder is within normal limits. No biliary ductal dilatation is seen. Pancreas: Unremarkable. No pancreatic ductal dilatation or surrounding inflammatory changes. Spleen: Normal in size without focal abnormality. Adrenals/Urinary Tract: Adrenal glands are within normal limits. Kidneys are well visualized bilaterally. No renal calculi or obstructive changes are noted. A cyst is noted within the left kidney stable from the prior exam. Bladder is within normal limits. Stomach/Bowel: Appendix is not visualized consistent with a prior surgical history. No obstructive or inflammatory changes of the colon are seen. Stomach and small bowel are within normal limits. Vascular/Lymphatic: Aortic atherosclerosis. No enlarged abdominal or pelvic lymph nodes. Reproductive: Prostate is unremarkable. Other: No abdominal wall hernia or abnormality. No abdominopelvic ascites. Musculoskeletal: Degenerative changes of lumbar spine are noted. IMPRESSION: Fatty liver. Left renal cyst. No acute abnormality is noted to correspond with the patient's given clinical history. Electronically Signed   By: Elta Gu[place]  Lukens M.D.   On: 02/09/2021 15:23   DG Chest Portable 1 View  Result Date: 02/09/2021 CLINICAL DATA:  chest pain EXAM: PORTABLE CHEST - 1 VIEW COMPARISON:  12/18/2020 FINDINGS: The left costophrenic angle is excluded from the study. The mediastinal contours are within normal limits. No cardiomegaly. The lungs are clear bilaterally without evidence of focal  consolidation, pleural effusion, or pneumothorax. No acute osseous abnormality. IMPRESSION: No acute cardiopulmonary process. Electronically Signed   By: Ruthann Cancer MD   On: 02/09/2021 11:40    DVT Prophylaxis -SCD /heparin AM Labs Ordered, also please review Full Orders  Family Communication: Admission, patients condition and plan of care including tests being ordered have been discussed with the patient  who indicate understanding and agree with the plan   Code Status - Full Code  Likely DC to home  Condition   -stable  Roxan Hockey M.D on 02/09/2021 at 9:04 PM Go to [email or web address] -  for contact info  [facility] - Office  [phone]

## 2021-02-09 NOTE — ED Provider Notes (Signed)
Emergency Department Provider Note   I have reviewed the triage vital signs and the nursing notes.   HISTORY  Chief Complaint Chest Pain   HPI Timothy [name] is a 50 y.o. male with past history of nonocclusive CAD, hypertension, WPW status post ablation, and chronic alcohol abuse presents to the emergency department with mainly epigastric abdominal pain with some radiation into the chest.  Patient describes a bandlike pain across the upper abdomen worse in the right upper quadrant.  He has had some nausea and vomiting.  He had an episode of diaphoresis today when pain was particularly severe.  He presents for further evaluation.  No shortness of breath.  No diarrhea.  No blood in the emesis or stool.  He did drink 4 shots of vodka last night and states he does drink regularly.     Past Medical History:  Diagnosis Date  . Anxiety   . Arthritis   . CAD (coronary artery disease) 2007   50% stenosis small diagonal  . Central sleep apnea   . Chronic pain    since 1998 has been on chronic pain medication  . Fatty liver   . GERD (gastroesophageal reflux disease)   . Hemorrhoids   . Hepatomegaly 10/03/2014  . HTN (hypertension)   . Palpitations   . Syncope   . Tachycardia   . Tobacco abuse   . Tubular adenoma   . Wolff-Parkinson-White (WPW) syndrome    says was cured with ablation    Patient Active Problem List   Diagnosis Date Noted  . Barrett's esophagus without dysplasia 02/09/2021  . Esophagitis-etoh and NSAID related 12/20/2020  . PUD (peptic ulcer disease)/Gastric Ulcers---etoh and Nsaid related 12/20/2020  . Alcohol withdrawal/DTs 12/19/2020  . Atypical chest pain   . Chest pain 12/18/2020  . Non-intractable vomiting   . Dysphagia   . Elevated LFTs   . Hyperbilirubinemia   . Chronic combined systolic (congestive) and diastolic (congestive) heart failure ([place]) 05/08/2016  . Acute CHF (congestive heart failure) ([place]) 04/18/2016  . ETOH abuse 04/18/2016  . RBBB  04/18/2016  . Cardiomyopathy ([place]) 04/18/2016  . Family history of coronary artery disease in father 04/18/2016  . Dyslipidemia 04/18/2016  . Elevated brain natriuretic peptide (BNP) level   . Acute on chronic diastolic heart failure ([place])   . Interstitial edema   . Hemoptysis 04/11/2016  . Orthopnea 04/11/2016  . Elevated troponin I level   . Stroke ([place]) 10/28/2014  . Hypertensive emergency 10/28/2014  . Left sided numbness 10/28/2014  . Hepatomegaly 10/03/2014  . GERD (gastroesophageal reflux disease) 07/13/2013  . Hemorrhoids 07/13/2013  . Abdominal pain, chronic, right upper quadrant 05/03/2013  . Bowel habit changes 05/03/2013  . FH: colon cancer 05/03/2013  . Rectal bleeding 05/03/2013  . Hypokalemia 03/08/2013  . History of Wolff-Parkinson-White syndrome 03/08/2013  . Malignant hypertension 03/08/2013  . Polycythemia 03/08/2013  . Tobacco abuse 03/08/2013  . Chronic anxiety 03/08/2013  . Leukocytosis 03/08/2013  . Laceration of arm 03/08/2013  . Chest tightness 03/08/2013  . CAD (coronary artery disease) 08/11/2012  . Smoking 08/11/2012  . NIGHT SWEATS 10/23/2010  . Anxiety state 12/23/2008  . Essential hypertension 12/23/2008  . SYNCOPE 12/23/2008  . Palpitations 12/23/2008  . TACHYCARDIA, HX OF 12/23/2008    Past Surgical History:  Procedure Laterality Date  . ADENOIDECTOMY    . APPENDECTOMY    . ATRIAL ABLATION SURGERY    . AV NODE ABLATION    . BIOPSY N/A 05/12/2013  Procedure: BIOPSY;  Surgeon: Daneil Dolin, MD;  Location: [facility];  Service: Endoscopy;  Laterality: N/A;  . BIOPSY  12/20/2020   Procedure: BIOPSY;  Surgeon: Daneil Dolin, MD;  Location: [facility];  Service: Endoscopy;;  . CARDIAC CATHETERIZATION N/A 05/08/2016   Procedure: Right/Left Heart Cath and Coronary Angiography;  Surgeon: Peter M Martinique, MD;  Location: [facility];  Service: Cardiovascular;  Laterality: N/A;  . CARDIAC ELECTROPHYSIOLOGY STUDY AND ABLATION     SA node  ablation  . COLONOSCOPY     age 38  . COLONOSCOPY WITH PROPOFOL N/A 05/12/2013   Dr. Gala Romney- hemorrhoids, tubular adenoma  . ESOPHAGEAL DILATION  12/20/2020   Procedure: ESOPHAGEAL DILATION;  Surgeon: Daneil Dolin, MD;  Location: [facility];  Service: Endoscopy;;  . ESOPHAGEAL DILATION N/A 12/20/2020   Procedure: ESOPHAGEAL DILATION;  Surgeon: Daneil Dolin, MD;  [facility];  Service: Endoscopy;  Laterality: N/A;  . ESOPHAGOGASTRODUODENOSCOPY (EGD) WITH PROPOFOL N/A 05/12/2013   Dr. Gala Romney- hiatal hernia, erosive reflux esophagitis  . ESOPHAGOGASTRODUODENOSCOPY (EGD) WITH PROPOFOL N/A 12/20/2020   Procedure: ESOPHAGOGASTRODUODENOSCOPY (EGD) WITH PROPOFOL;  Surgeon: Daneil Dolin, MD;  [facility];  Service: Endoscopy;  Laterality: N/A;  . HAND SURGERY    . HEMORRHOID BANDING  06/22/13  . POLYPECTOMY N/A 05/12/2013   Procedure: POLYPECTOMY;  Surgeon: Daneil Dolin, MD;  Location: [facility];  Service: Endoscopy;  Laterality: N/A;  . SHOULDER SURGERY      Allergies Morphine  Family History  Problem Relation Age of Onset  . Coronary artery disease Father        cabg  . Colon cancer Father        age 29, chemo/surgery  . Breast cancer Mother   . Hypertension Mother     Social History Social History   Tobacco Use  . Smoking status: Current Every Day Smoker    Packs/day: 0.50    Years: 25.00    Pack years: 12.50    Types: Cigarettes  . Smokeless tobacco: Never Used  Vaping Use  . Vaping Use: Never used  Substance Use Topics  . Alcohol use: Yes    Comment: daily; 12/18/20-5 days/week, 6 drinks at time  . Drug use: Yes    Types: Marijuana    Comment: hx marijuana use    Review of Systems  Constitutional: No fever/chills Eyes: No visual changes. ENT: No sore throat. Cardiovascular: Positive chest pain. Respiratory: Denies shortness of breath. Gastrointestinal: Positive epigastric abdominal pain. Positive nausea and vomiting.  No diarrhea.  No  constipation. Genitourinary: Negative for dysuria. Musculoskeletal: Negative for back pain. Skin: Negative for rash. Neurological: Negative for headaches, focal weakness or numbness.  10-point ROS otherwise negative.  ____________________________________________   PHYSICAL EXAM:  VITAL SIGNS: ED Triage Vitals  Enc Vitals Group     BP 02/09/21 1118 (!) 154/113     Pulse Rate 02/09/21 1118 (!) 114     Resp 02/09/21 1118 (!) 25     Temp 02/09/21 1118 97.8 F (36.6 C)     Temp Source 02/09/21 1118 Oral     SpO2 02/09/21 1118 98 %     Weight 02/09/21 1123 230 lb (104.3 kg)     Height 02/09/21 1123 6' (1.829 m)   Constitutional: Alert and oriented. Well appearing and in no acute distress. Eyes: Conjunctivae are normal.  Head: Atraumatic. Nose: No congestion/rhinnorhea. Mouth/Throat: Mucous membranes are moist.  Neck: No stridor.  Cardiovascular:  Normal rate, regular rhythm. Good peripheral circulation. Grossly normal heart sounds.   Respiratory: Normal respiratory effort.  No retractions. Lungs CTAB. Gastrointestinal: With mild epigastric tenderness throughout slightly worse on the right. No distention.  Musculoskeletal: No lower extremity tenderness nor edema. No gross deformities of extremities. Neurologic:  Normal speech and language. No gross focal neurologic deficits are appreciated.  Skin:  Skin is warm, dry and intact. No rash noted.   ____________________________________________   LABS (all labs ordered are listed, but only abnormal results are displayed)  Labs Reviewed  COMPREHENSIVE METABOLIC PANEL - Abnormal; Notable for the following components:      Result Value   Sodium 131 (*)    Chloride 95 (*)    Glucose, Bld 125 (*)    Total Protein 8.3 (*)    AST 74 (*)    ALT 77 (*)    Total Bilirubin 2.2 (*)    All other components within normal limits  LIPASE, BLOOD - Abnormal; Notable for the following components:   Lipase 103 (*)    All other components  within normal limits  ETHANOL - Abnormal; Notable for the following components:   Alcohol, Ethyl (B) 11 (*)    All other components within normal limits  MAGNESIUM - Abnormal; Notable for the following components:   Magnesium 1.6 (*)    All other components within normal limits  TROPONIN I (HIGH SENSITIVITY) - Abnormal; Notable for the following components:   Troponin I (High Sensitivity) 34 (*)    All other components within normal limits  TROPONIN I (HIGH SENSITIVITY) - Abnormal; Notable for the following components:   Troponin I (High Sensitivity) 31 (*)    All other components within normal limits  RESP PANEL BY RT-PCR (FLU A&B, COVID) [id number]  CBC WITH DIFFERENTIAL/PLATELET  CBC  COMPREHENSIVE METABOLIC PANEL   ____________________________________________  EKG   EKG Interpretation  Date/Time:  Saturday Feb 09 2021 11:16:22 EDT Ventricular Rate:  110 PR Interval:  143 QRS Duration: 151 QT Interval:  383 QTC Calculation: 519 R Axis:   42 Text Interpretation: Sinus tachycardia Right bundle branch block Abnormal inferior Q waves Similar to April 2022 Confirmed by Nanda Quinton [id number]) on 02/09/2021 11:19:26 AM       ____________________________________________  RADIOLOGY  CT ABDOMEN PELVIS WO CONTRAST  Result Date: 02/09/2021 CLINICAL DATA:  Nausea and vomiting with chest pain, initial encounter EXAM: CT ABDOMEN AND PELVIS WITHOUT CONTRAST TECHNIQUE: Multidetector CT imaging of the abdomen and pelvis was performed following the standard protocol without IV contrast. COMPARISON:  03/07/2013 FINDINGS: Lower chest: No acute abnormality. Hepatobiliary: Fatty infiltration of the liver is noted. The gallbladder is within normal limits. No biliary ductal dilatation is seen. Pancreas: Unremarkable. No pancreatic ductal dilatation or surrounding inflammatory changes. Spleen: Normal in size without focal abnormality. Adrenals/Urinary Tract: Adrenal glands are within normal limits.  Kidneys are well visualized bilaterally. No renal calculi or obstructive changes are noted. A cyst is noted within the left kidney stable from the prior exam. Bladder is within normal limits. Stomach/Bowel: Appendix is not visualized consistent with a prior surgical history. No obstructive or inflammatory changes of the colon are seen. Stomach and small bowel are within normal limits. Vascular/Lymphatic: Aortic atherosclerosis. No enlarged abdominal or pelvic lymph nodes. Reproductive: Prostate is unremarkable. Other: No abdominal wall hernia or abnormality. No abdominopelvic ascites. Musculoskeletal: Degenerative changes of lumbar spine are noted. IMPRESSION: Fatty liver. Left renal cyst. No acute abnormality is noted to correspond with the patient's given clinical  history. Electronically Signed   By: Inez Catalina M.D.   On: 02/09/2021 15:23   DG Chest Portable 1 View  Result Date: 02/09/2021 CLINICAL DATA:  chest pain EXAM: PORTABLE CHEST - 1 VIEW COMPARISON:  12/18/2020 FINDINGS: The left costophrenic angle is excluded from the study. The mediastinal contours are within normal limits. No cardiomegaly. The lungs are clear bilaterally without evidence of focal consolidation, pleural effusion, or pneumothorax. No acute osseous abnormality. IMPRESSION: No acute cardiopulmonary process. Electronically Signed   By: Ruthann Cancer MD   On: 02/09/2021 11:40    ____________________________________________   PROCEDURES  Procedure(s) performed:   Procedures  None  ____________________________________________   INITIAL IMPRESSION / ASSESSMENT AND PLAN / ED COURSE  Pertinent labs & imaging results that were available during my care of the patient were reviewed by me and considered in my medical decision making (see chart for details).   Patient presents to the emergency department with report initially of chest pain with diaphoresis.  He seems more tender in his epigastric and right upper quadrant  region.  He is alcoholic and suspect this is the underlying cause of his symptoms.  His EKG is abnormal but does seem similar to prior.  Initial troponin is only mildly elevated in line with prior values.  I reviewed his last cath from 2017 which showed no obstructive CAD.  Mild tachycardia here.  Gave the patient Ativan and IV fluids with alcohol being < 11.   CIWA elevated here.   Second troponin pending. ? Pancreatitis. Plan for reassess by Dr. Sabra Heck. Care transferred.  ____________________________________________  FINAL CLINICAL IMPRESSION(S) / ED DIAGNOSES  Final diagnoses:  Acute gastritis without hemorrhage, unspecified gastritis type  Alcohol withdrawal syndrome with complication (HCC)  Upper abdominal pain     MEDICATIONS GIVEN DURING THIS VISIT:  Medications  LORazepam (ATIVAN) injection 0-4 mg (2 mg Intravenous Given 02/10/21 0220)    Or  LORazepam (ATIVAN) tablet 0-4 mg ( Oral See Alternative 02/10/21 0220)  LORazepam (ATIVAN) injection 0-4 mg (has no administration in time range)    Or  LORazepam (ATIVAN) tablet 0-4 mg (has no administration in time range)  thiamine tablet 100 mg ( Oral See Alternative 02/09/21 1516)    Or  thiamine (B-1) injection 100 mg (100 mg Intravenous Given 02/09/21 1516)  LORazepam (ATIVAN) injection 2 mg (2 mg Intravenous Not Given 02/09/21 1604)  diazepam (VALIUM) tablet 2 mg (2 mg Oral Given 0/98/11 9147)  folic acid (FOLVITE) tablet 1 mg (has no administration in time range)  sodium chloride 0.9 % 1,000 mL with thiamine 829 mg, folic acid 1 mg, multivitamins adult 10 mL infusion ( Intravenous New Bag/Given 02/09/21 2354)  pantoprazole (PROTONIX) injection 40 mg (40 mg Intravenous Not Given 02/09/21 2240)  sucralfate (CARAFATE) tablet 1 g (1 g Oral Given 02/09/21 2239)  oxyCODONE (Oxy IR/ROXICODONE) immediate release tablet 10 mg (10 mg Oral Given 02/10/21 0329)  atorvastatin (LIPITOR) tablet 10 mg (10 mg Oral Given 02/09/21 2239)  carvedilol  (COREG) tablet 25 mg (25 mg Oral Given 02/09/21 2239)  cloNIDine (CATAPRES - Dosed in mg/24 hr) patch 0.3 mg (0.3 mg Transdermal Not Given 02/09/21 2241)  losartan (COZAAR) tablet 25 mg (has no administration in time range)  spironolactone (ALDACTONE) tablet 25 mg (has no administration in time range)  busPIRone (BUSPAR) tablet 10 mg (10 mg Oral Given 02/10/21 0551)  hydrOXYzine (ATARAX/VISTARIL) tablet 25 mg (25 mg Oral Given 02/10/21 0219)  nicotine (NICODERM CQ - dosed in mg/24 hours)  patch 21 mg (21 mg Transdermal Patch Applied 02/09/21 2044)  traZODone (DESYREL) tablet 150 mg (150 mg Oral Given 02/09/21 2240)  cholecalciferol (VITAMIN D3) tablet 2,000 Units (has no administration in time range)  multivitamin with minerals tablet 1 tablet (has no administration in time range)  albuterol (PROVENTIL) (2.5 MG/3ML) 0.083% nebulizer solution 3 mL (has no administration in time range)  sodium chloride flush (NS) 0.9 % injection 3 mL (3 mLs Intravenous Given 02/09/21 2241)  sodium chloride flush (NS) 0.9 % injection 3 mL (3 mLs Intravenous Given 02/09/21 2242)  sodium chloride flush (NS) 0.9 % injection 3 mL (has no administration in time range)  0.9 %  sodium chloride infusion (has no administration in time range)  acetaminophen (TYLENOL) tablet 650 mg (650 mg Oral Given 02/10/21 0602)    Or  acetaminophen (TYLENOL) suppository 650 mg ( Rectal See Alternative 02/10/21 0602)  polyethylene glycol (MIRALAX / GLYCOLAX) packet 17 g (has no administration in time range)  bisacodyl (DULCOLAX) suppository 10 mg (has no administration in time range)  ondansetron (ZOFRAN) tablet 4 mg ( Oral See Alternative 02/09/21 2049)    Or  ondansetron (ZOFRAN) injection 4 mg (4 mg Intravenous Given 02/09/21 2049)  heparin injection 5,000 Units (5,000 Units Subcutaneous Given 02/10/21 0551)  sodium chloride 0.9 % bolus 1,000 mL (0 mLs Intravenous Stopped 02/09/21 1354)  LORazepam (ATIVAN) injection 1 mg (1 mg Intravenous Given  02/09/21 1137)  LORazepam (ATIVAN) injection 2 mg (2 mg Intravenous Given 02/09/21 1352)  pantoprazole (PROTONIX) injection 80 mg (80 mg Intravenous Given 02/09/21 1610)  sodium chloride 0.9 % bolus 1,000 mL (0 mLs Intravenous Stopped 02/09/21 1913)  magnesium sulfate IVPB 2 g 50 mL (0 g Intravenous Stopped 02/09/21 2245)     NEW OUTPATIENT MEDICATIONS STARTED DURING THIS VISIT:  New Prescriptions   CHLORDIAZEPOXIDE (LIBRIUM) 25 MG CAPSULE    Take 2 capsules (50 mg total) by mouth 3 (three) times daily as needed for anxiety. 50mg  by mouth q 6 h day 1 50mg  by mouth q 8 h day 2 50mg  by mouth q 12 h day 3 50mg  by mough QHS day 4   PANTOPRAZOLE (PROTONIX) 20 MG TABLET    Take 1 tablet (20 mg total) by mouth daily.    Note:  This document was prepared using Dragon voice recognition software and may include unintentional dictation errors.  Nanda Quinton, MD, [facility]    Shields Pautz, W[facility], MD 02/10/21 [phone]

## 2021-02-09 NOTE — ED Notes (Signed)
Pt states back and abd pain

## 2021-02-09 NOTE — ED Notes (Signed)
Got pt a pillow

## 2021-02-09 NOTE — ED Triage Notes (Signed)
Pt to er room number 11, pt c/o chest pain and diaphoresis, ekg complete and given to MD long.  Pt states that he has about 4 shots of vodka a day.  Pt states that his chest pain started about 430am, states that it has been getting worse and now he feels shaky .

## 2021-02-09 NOTE — ED Provider Notes (Signed)
Labs are reassuring, troponin is trending down, COVID is negative, metabolic panel reveals no significant abnormalities, lipase is slightly elevated at 103, not 3 times the upper limit of normal.  CBC shows no leukocytosis, CT scan of the abdomen and pelvis was also unremarkable.  This patient was given Ativan, IV fluids, tachycardia has not improved nor has his tenderness and on my exam is significantly tender in the right upper quadrant and midepigastrium.  He has tremor in his bilateral upper extremities and continues to be tachycardic with a pulse between 101 110.  I suspect that he is still having alcohol withdrawal and he does not want to drink, with his inability to get any benzodiazepines at home I think it is reasonable to admit for possible pancreatitis, possible ultrasound in the morning to look at his gallbladder, ongoing benzodiazepines and fluids.  Hospitalist paged at 8:05 PM   Noemi Chapel, MD 02/09/21 2004

## 2021-02-10 ENCOUNTER — Observation Stay (HOSPITAL_COMMUNITY): Payer: Medicare Other

## 2021-02-10 DIAGNOSIS — K2270 Barrett's esophagus without dysplasia: Secondary | ICD-10-CM | POA: Diagnosis not present

## 2021-02-10 DIAGNOSIS — F10230 Alcohol dependence with withdrawal, uncomplicated: Secondary | ICD-10-CM | POA: Diagnosis not present

## 2021-02-10 DIAGNOSIS — N281 Cyst of kidney, acquired: Secondary | ICD-10-CM | POA: Diagnosis not present

## 2021-02-10 DIAGNOSIS — K760 Fatty (change of) liver, not elsewhere classified: Secondary | ICD-10-CM | POA: Diagnosis not present

## 2021-02-10 DIAGNOSIS — K209 Esophagitis, unspecified without bleeding: Secondary | ICD-10-CM | POA: Diagnosis not present

## 2021-02-10 DIAGNOSIS — I5042 Chronic combined systolic (congestive) and diastolic (congestive) heart failure: Secondary | ICD-10-CM | POA: Diagnosis not present

## 2021-02-10 DIAGNOSIS — F10239 Alcohol dependence with withdrawal, unspecified: Secondary | ICD-10-CM | POA: Diagnosis not present

## 2021-02-10 LAB — CBC
HCT: 42.7 % (ref 39.0–52.0)
Hemoglobin: 14.4 g/dL (ref 13.0–17.0)
MCH: 32.8 pg (ref 26.0–34.0)
MCHC: 33.7 g/dL (ref 30.0–36.0)
MCV: 97.3 fL (ref 80.0–100.0)
Platelets: 177 10*3/uL (ref 150–400)
RBC: 4.39 MIL/uL (ref 4.22–5.81)
RDW: 12 % (ref 11.5–15.5)
WBC: 5.9 10*3/uL (ref 4.0–10.5)
nRBC: 0 % (ref 0.0–0.2)

## 2021-02-10 LAB — COMPREHENSIVE METABOLIC PANEL
ALT: 67 U/L — ABNORMAL HIGH (ref 0–44)
AST: 62 U/L — ABNORMAL HIGH (ref 15–41)
Albumin: 4 g/dL (ref 3.5–5.0)
Alkaline Phosphatase: 78 U/L (ref 38–126)
Anion gap: 10 (ref 5–15)
BUN: 19 mg/dL (ref 6–20)
CO2: 23 mmol/L (ref 22–32)
Calcium: 9.1 mg/dL (ref 8.9–10.3)
Chloride: 97 mmol/L — ABNORMAL LOW (ref 98–111)
Creatinine, Ser: 1.01 mg/dL (ref 0.61–1.24)
GFR, Estimated: >60 mL/min (ref >60)
Glucose, Bld: 94 mg/dL (ref 70–99)
Potassium: 3.7 mmol/L (ref 3.5–5.1)
Sodium: 130 mmol/L — ABNORMAL LOW (ref 135–145)
Total Bilirubin: 2.8 mg/dL — ABNORMAL HIGH (ref 0.3–1.2)
Total Protein: 7.1 g/dL (ref 6.5–8.1)

## 2021-02-10 MED ORDER — PANTOPRAZOLE SODIUM 40 MG PO TBEC: 40.0000 mg | DELAYED_RELEASE_TABLET | Freq: Two times a day (BID) | ORAL | 2 refills | Status: DC

## 2021-02-10 MED ORDER — SODIUM CHLORIDE 0.9 % IV SOLN: INTRAVENOUS | Status: DC

## 2021-02-10 MED ORDER — FUROSEMIDE 40 MG PO TABS: 40.0000 mg | ORAL_TABLET | Freq: Every day | ORAL | 3 refills | Status: DC

## 2021-02-10 MED ORDER — PNEUMOCOCCAL VAC POLYVALENT 25 MCG/0.5ML IJ INJ
0.5000 mL | INJECTION | Freq: Once | INTRAMUSCULAR | Status: AC
  Administered 2021-02-10: 0.5 mL via INTRAMUSCULAR
  Filled 2021-02-10: qty 0.5

## 2021-02-10 MED ORDER — SUCRALFATE 1 G PO TABS: 1.0000 g | ORAL_TABLET | Freq: Three times a day (TID) | ORAL | 0 refills | Status: DC

## 2021-02-10 MED ORDER — CHLORDIAZEPOXIDE HCL 25 MG PO CAPS: 25.0000 mg | ORAL_CAPSULE | Freq: Three times a day (TID) | ORAL | 0 refills | Status: DC | PRN

## 2021-02-10 MED ORDER — MAGNESIUM SULFATE 2 GM/50ML IV SOLN
2.0000 g | Freq: Once | INTRAVENOUS | Status: AC
  Administered 2021-02-10: 2 g via INTRAVENOUS
  Filled 2021-02-10 (×2): qty 50

## 2021-02-10 MED ORDER — CLONIDINE HCL 0.2 MG/24HR TD PTWK
0.3000 mg | MEDICATED_PATCH | TRANSDERMAL | Status: DC
  Administered 2021-02-10: 0.3 mg via TRANSDERMAL
  Filled 2021-02-10 (×2): qty 1

## 2021-02-10 NOTE — ED Notes (Signed)
Pt asked for pain medication due to chronic pain "all over". Pt informed that it was not quite time for pain medication and that nurse will reevaluate when it is time for medication. At this time pt received ativan and hydroxyzine due to CIWA and not being able to sleep. Will continue to monitor.

## 2021-02-10 NOTE — ED Notes (Signed)
ED TO INPATIENT HANDOFF REPORT  ED Nurse Name and Phone #:   S Name/Age/Gender Timothy [name] 50 y.o. male Room/Bed: APA11/APA11  Code Status   Code Status: Full Code  Home/SNF/Other Home Patient oriented to: self, place, time and situation Is this baseline? Yes   Triage Complete: Triage complete  Chief Complaint Alcohol withdrawal ([place]) [F10.239]  Triage Note Pt to er room number 11, pt c/o chest pain and diaphoresis, ekg complete and given to MD long.  Pt states that he has about 4 shots of vodka a day.  Pt states that his chest pain started about 430am, states that it has been getting worse and now he feels shaky .     Allergies Allergies  Allergen Reactions  . Morphine Other (See Comments)    Makes overly sleepy    Level of Care/Admitting Diagnosis ED Disposition    ED Disposition Condition [place]: [facility] [[id number]]  Level of Care: Telemetry [5]  Covid Evaluation: Asymptomatic Screening Protocol (No Symptoms)  Diagnosis: Alcohol withdrawal ([place]) [291.81.ICD-9-CM]  Admitting Physician: Morrison Old  Attending Physician: Morrison Old       B Medical/Surgery History Past Medical History:  Diagnosis Date  . Anxiety   . Arthritis   . CAD (coronary artery disease) 2007   50% stenosis small diagonal  . Central sleep apnea   . Chronic pain    since 1998 has been on chronic pain medication  . Fatty liver   . GERD (gastroesophageal reflux disease)   . Hemorrhoids   . Hepatomegaly 10/03/2014  . HTN (hypertension)   . Palpitations   . Syncope   . Tachycardia   . Tobacco abuse   . Tubular adenoma   . Wolff-Parkinson-White (WPW) syndrome    says was cured with ablation   Past Surgical History:  Procedure Laterality Date  . ADENOIDECTOMY    . APPENDECTOMY    . ATRIAL ABLATION SURGERY    . AV NODE ABLATION    . BIOPSY N/A 05/12/2013   Procedure: BIOPSY;  Surgeon: Daneil Dolin, MD;   Location: [facility];  Service: Endoscopy;  Laterality: N/A;  . BIOPSY  12/20/2020   Procedure: BIOPSY;  Surgeon: Daneil Dolin, MD;  Location[facility];  Service: Endoscopy;;  . CARDIAC CATHETERIZATION N/A 05/08/2016   Procedure: Right/Left Heart Cath and Coronary Angiography;  Surgeon: Peter M Martinique, MD;  Location: [place] [facility];  Service: Cardiovascular;  Laterality: N/A;  . CARDIAC ELECTROPHYSIOLOGY STUDY AND ABLATION     SA node ablation  . COLONOSCOPY     age 70  . COLONOSCOPY WITH PROPOFOL N/A 05/12/2013   Dr. Gala Romney- hemorrhoids, tubular adenoma  . ESOPHAGEAL DILATION  12/20/2020   Procedure: ESOPHAGEAL DILATION;  Surgeon: Daneil Dolin, MD;  [facility];  Service: Endoscopy;;  . ESOPHAGEAL DILATION N/A 12/20/2020   Procedure: ESOPHAGEAL DILATION;  Surgeon: Daneil Dolin, MD;  [facility];  Service: Endoscopy;  Laterality: N/A;  . ESOPHAGOGASTRODUODENOSCOPY (EGD) WITH PROPOFOL N/A 05/12/2013   Dr. Gala Romney- hiatal hernia, erosive reflux esophagitis  . ESOPHAGOGASTRODUODENOSCOPY (EGD) WITH PROPOFOL N/A 12/20/2020   Procedure: ESOPHAGOGASTRODUODENOSCOPY (EGD) WITH PROPOFOL;  Surgeon: Daneil Dolin, MD;  [facility];  Service: Endoscopy;  Laterality: N/A;  . HAND SURGERY    . HEMORRHOID BANDING  06/22/13  . POLYPECTOMY N/A 05/12/2013   Procedure: POLYPECTOMY;  Surgeon: Daneil Dolin, MD;  Location: [facility];  Service: Endoscopy;  Laterality: N/A;  . SHOULDER SURGERY       A IV Location/Drains/Wounds Patient Lines/Drains/Airways Status    Active Line/Drains/Airways    Name Placement date Placement time Site Days   Peripheral IV 02/09/21 20 G Anterior;Distal;Right;Upper Arm 02/09/21  1120  Arm  1   Peripheral IV 02/09/21 20 G Right Hand 02/09/21  2201  Hand  1          Intake/Output Last 24 hours  Intake/Output Summary (Last 24 hours) at 02/10/2021 0827 Last data filed at 02/10/2021 0214 Gross per 24 hour  Intake 2050 ml  Output 275 ml   Net 1775 ml    Labs/Imaging Results for orders placed or performed during the hospital encounter of 02/09/21 (from the past 48 hour(s))  Comprehensive metabolic panel     Status: Abnormal   Collection Time: 02/09/21 11:22 AM  Result Value Ref Range   Sodium 131 (L) 135 - 145 mmol/L   Potassium 3.7 3.5 - 5.1 mmol/L   Chloride 95 (L) 98 - 111 mmol/L   CO2 22 22 - 32 mmol/L   Glucose, Bld 125 (H) 70 - 99 mg/dL    Comment: Glucose reference range applies only to samples taken after fasting for at least 8 hours.   BUN 18 6 - 20 mg/dL   Creatinine, Ser 1.21 0.61 - 1.24 mg/dL   Calcium 9.7 8.9 - 10.3 mg/dL   Total Protein 8.3 (H) 6.5 - 8.1 g/dL   Albumin 4.7 3.5 - 5.0 g/dL   AST 74 (H) 15 - 41 U/L   ALT 77 (H) 0 - 44 U/L   Alkaline Phosphatase 83 38 - 126 U/L   Total Bilirubin 2.2 (H) 0.3 - 1.2 mg/dL   GFR, Estimated >60 >60 mL/min    Comment: (NOTE) Calculated using the CKD-EPI Creatinine Equation (2021)    Anion gap 14 5 - 15    Comment: Performed at [facility], [address] [place], [place] [address]  Troponin I (High Sensitivity)     Status: Abnormal   Collection Time: 02/09/21 11:22 AM  Result Value Ref Range   Troponin I (High Sensitivity) 34 (H) <18 ng/L    Comment: (NOTE) Elevated high sensitivity troponin I (hsTnI) values and significant  changes across serial measurements may suggest ACS but many other  chronic and acute conditions are known to elevate hsTnI results.  Refer to the "Links" section for chest pain algorithms and additional  guidance. Performed at [facility], [address], [place], [place] [address]   CBC with Differential     Status: None   Collection Time: 02/09/21 11:22 AM  Result Value Ref Range   WBC 7.4 4.0 - 10.5 K/uL   RBC 4.89 4.22 - 5.81 MIL/uL   Hemoglobin 16.0 13.0 - 17.0 g/dL   HCT 45.6 39.0 - 52.0 %   MCV 93.3 80.0 - 100.0 fL   MCH 32.7 26.0 - 34.0 pg   MCHC 35.1 30.0 - 36.0 g/dL   RDW 11.9 11.5 - 15.5 %   Platelets 235 150 -  400 K/uL   nRBC 0.0 0.0 - 0.2 %   Neutrophils Relative % 66 %   Neutro Abs 4.9 1.7 - 7.7 K/uL   Lymphocytes Relative 20 %   Lymphs Abs 1.5 0.7 - 4.0 K/uL   Monocytes Relative 12 %   Monocytes Absolute 0.9 0.1 - 1.0 K/uL   Eosinophils Relative 1 %   Eosinophils Absolute 0.0 0.0 - 0.5  K/uL   Basophils Relative 1 %   Basophils Absolute 0.1 0.0 - 0.1 K/uL   Immature Granulocytes 0 %   Abs Immature Granulocytes 0.03 0.00 - 0.07 K/uL    Comment: Performed at [facility], [address] [place], [place] [address]  Ethanol     Status: Abnormal   Collection Time: 02/09/21 11:22 AM  Result Value Ref Range   Alcohol, Ethyl (B) 11 (H) <10 mg/dL    Comment: (NOTE) Lowest detectable limit for serum alcohol is 10 mg/dL.  For medical purposes only. Performed at [facility], [address], [place], [place] [address]   Lipase, blood     Status: Abnormal   Collection Time: 02/09/21 11:34 AM  Result Value Ref Range   Lipase 103 (H) 11 - 51 U/L    Comment: Performed at [facility], [address], [place], [place] [address]  Troponin I (High Sensitivity)     Status: Abnormal   Collection Time: 02/09/21  2:04 PM  Result Value Ref Range   Troponin I (High Sensitivity) 31 (H) <18 ng/L    Comment: (NOTE) Elevated high sensitivity troponin I (hsTnI) values and significant  changes across serial measurements may suggest ACS but many other  chronic and acute conditions are known to elevate hsTnI results.  Refer to the "Links" section for chest pain algorithms and additional  guidance. Performed at [facility], [address] [place], [place] [address]   Resp Panel by RT-PCR (Flu A&B, Covid) Nasopharyngeal Swab     Status: None   Collection Time: 02/09/21  3:18 PM   Specimen: Nasopharyngeal Swab; Nasopharyngeal(NP) swabs in vial transport medium  Result Value Ref Range   SARS Coronavirus 2 by RT PCR NEGATIVE NEGATIVE    Comment: (NOTE) SARS-CoV-2 target nucleic acids are NOT DETECTED.  The  SARS-CoV-2 RNA is generally detectable in upper respiratory specimens during the acute phase of infection. The lowest concentration of SARS-CoV-2 viral copies this assay can detect is 138 copies/mL. A negative result does not preclude SARS-Cov-2 infection and should not be used as the sole basis for treatment or other patient management decisions. A negative result may occur with  improper specimen collection/handling, submission of specimen other than nasopharyngeal swab, presence of viral mutation(s) within the areas targeted by this assay, and inadequate number of viral copies(<138 copies/mL). A negative result must be combined with clinical observations, patient history, and epidemiological information. The expected result is Negative.  Fact Sheet for Patients:  EntrepreneurPulse.com.au  Fact Sheet for Healthcare Providers:  IncredibleEmployment.be  This test is no t yet approved or cleared by the [place] FDA and  has been authorized for detection and/or diagnosis of SARS-CoV-2 by FDA under an Emergency Use Authorization (EUA). This EUA will remain  in effect (meaning this test can be used) for the duration of the COVID-19 declaration under Section 564(b)(1) of the Act, 21 U.S.C.section 360bbb-3(b)(1), unless the authorization is terminated  or revoked sooner.       Influenza A by PCR NEGATIVE NEGATIVE   Influenza B by PCR NEGATIVE NEGATIVE    Comment: (NOTE) The Xpert Xpress SARS-CoV-2/FLU/RSV plus assay is intended as an aid in the diagnosis of influenza from Nasopharyngeal swab specimens and should not be used as a sole basis for treatment. Nasal washings and aspirates are unacceptable for Xpert Xpress SARS-CoV-2/FLU/RSV testing.  Fact Sheet for Patients: EntrepreneurPulse.com.au  Fact Sheet for Healthcare Providers: IncredibleEmployment.be  This test is not yet approved or cleared by the  [place]  States FDA and has been authorized for detection and/or diagnosis of SARS-CoV-2 by FDA under an Emergency Use Authorization (EUA). This EUA will remain in effect (meaning this test can be used) for the duration of the COVID-19 declaration under Section 564(b)(1) of the Act, 21 U.S.C. section 360bbb-3(b)(1), unless the authorization is terminated or revoked.  Performed at [facility], [address] [place], [place] [address]   Magnesium     Status: Abnormal   Collection Time: 02/09/21  8:56 PM  Result Value Ref Range   Magnesium 1.6 (L) 1.7 - 2.4 mg/dL    Comment: Performed at [facility], [address], [place], [place] [address]  CBC     Status: None   Collection Time: 02/10/21  6:58 AM  Result Value Ref Range   WBC 5.9 4.0 - 10.5 K/uL   RBC 4.39 4.22 - 5.81 MIL/uL   Hemoglobin 14.4 13.0 - 17.0 g/dL   HCT 42.7 39.0 - 52.0 %   MCV 97.3 80.0 - 100.0 fL   MCH 32.8 26.0 - 34.0 pg   MCHC 33.7 30.0 - 36.0 g/dL   RDW 12.0 11.5 - 15.5 %   Platelets 177 150 - 400 K/uL   nRBC 0.0 0.0 - 0.2 %    Comment: Performed at [facility], [address], [place], [place] [address]  Comprehensive metabolic panel     Status: Abnormal   Collection Time: 02/10/21  6:58 AM  Result Value Ref Range   Sodium 130 (L) 135 - 145 mmol/L   Potassium 3.7 3.5 - 5.1 mmol/L   Chloride 97 (L) 98 - 111 mmol/L   CO2 23 22 - 32 mmol/L   Glucose, Bld 94 70 - 99 mg/dL    Comment: Glucose reference range applies only to samples taken after fasting for at least 8 hours.   BUN 19 6 - 20 mg/dL   Creatinine, Ser 1.01 0.61 - 1.24 mg/dL   Calcium 9.1 8.9 - 10.3 mg/dL   Total Protein 7.1 6.5 - 8.1 g/dL   Albumin 4.0 3.5 - 5.0 g/dL   AST 62 (H) 15 - 41 U/L   ALT 67 (H) 0 - 44 U/L   Alkaline Phosphatase 78 38 - 126 U/L   Total Bilirubin 2.8 (H) 0.3 - 1.2 mg/dL   GFR, Estimated >60 >60 mL/min    Comment: (NOTE) Calculated using the CKD-EPI Creatinine Equation (2021)    Anion gap 10 5 - 15    Comment:  Performed at [facility], [address], [place], [place] [address]   CT ABDOMEN PELVIS WO CONTRAST  Result Date: 02/09/2021 CLINICAL DATA:  Nausea and vomiting with chest pain, initial encounter EXAM: CT ABDOMEN AND PELVIS WITHOUT CONTRAST TECHNIQUE: Multidetector CT imaging of the abdomen and pelvis was performed following the standard protocol without IV contrast. COMPARISON:  03/07/2013 FINDINGS: Lower chest: No acute abnormality. Hepatobiliary: Fatty infiltration of the liver is noted. The gallbladder is within normal limits. No biliary ductal dilatation is seen. Pancreas: Unremarkable. No pancreatic ductal dilatation or surrounding inflammatory changes. Spleen: Normal in size without focal abnormality. Adrenals/Urinary Tract: Adrenal glands are within normal limits. Kidneys are well visualized bilaterally. No renal calculi or obstructive changes are noted. A cyst is noted within the left kidney stable from the prior exam. Bladder is within normal limits. Stomach/Bowel: Appendix is not visualized consistent with a prior surgical history. No obstructive or inflammatory changes of the colon are seen. Stomach and small bowel are within normal limits. Vascular/Lymphatic:  Aortic atherosclerosis. No enlarged abdominal or pelvic lymph nodes. Reproductive: Prostate is unremarkable. Other: No abdominal wall hernia or abnormality. No abdominopelvic ascites. Musculoskeletal: Degenerative changes of lumbar spine are noted. IMPRESSION: Fatty liver. Left renal cyst. No acute abnormality is noted to correspond with the patient's given clinical history. Electronically Signed   By: Inez Catalina M.D.   On: 02/09/2021 15:23   DG Chest Portable 1 View  Result Date: 02/09/2021 CLINICAL DATA:  chest pain EXAM: PORTABLE CHEST - 1 VIEW COMPARISON:  12/18/2020 FINDINGS: The left costophrenic angle is excluded from the study. The mediastinal contours are within normal limits. No cardiomegaly. The lungs are clear bilaterally  without evidence of focal consolidation, pleural effusion, or pneumothorax. No acute osseous abnormality. IMPRESSION: No acute cardiopulmonary process. Electronically Signed   By: Ruthann Cancer MD   On: 02/09/2021 11:40    Pending Labs Unresulted Labs (From admission, onward)         None      Vitals/Pain Today's Vitals   02/10/21 0730 02/10/21 0755 02/10/21 0800 02/10/21 0814  BP: (!) 121/92 (!) 127/96    Pulse:  (!) 104    Resp: 16  18   Temp:      TempSrc:      SpO2:      Weight:      Height:      PainSc:    8     Isolation Precautions No active isolations  Medications Medications  LORazepam (ATIVAN) injection 0-4 mg (2 mg Intravenous Given 02/10/21 0814)    Or  LORazepam (ATIVAN) tablet 0-4 mg ( Oral See Alternative 02/10/21 0814)  LORazepam (ATIVAN) injection 0-4 mg (has no administration in time range)    Or  LORazepam (ATIVAN) tablet 0-4 mg (has no administration in time range)  thiamine tablet 100 mg ( Oral See Alternative 02/09/21 1516)    Or  thiamine (B-1) injection 100 mg (100 mg Intravenous Given 02/09/21 1516)  LORazepam (ATIVAN) injection 2 mg (2 mg Intravenous Not Given 02/09/21 1604)  diazepam (VALIUM) tablet 2 mg (2 mg Oral Given 9/93/71 6967)  folic acid (FOLVITE) tablet 1 mg (has no administration in time range)  sodium chloride 0.9 % 1,000 mL with thiamine 893 mg, folic acid 1 mg, multivitamins adult 10 mL infusion ( Intravenous Stopped 02/10/21 0823)  pantoprazole (PROTONIX) injection 40 mg (40 mg Intravenous Not Given 02/09/21 2240)  sucralfate (CARAFATE) tablet 1 g (1 g Oral Given 02/09/21 2239)  oxyCODONE (Oxy IR/ROXICODONE) immediate release tablet 10 mg (10 mg Oral Given 02/10/21 0329)  atorvastatin (LIPITOR) tablet 10 mg (10 mg Oral Given 02/09/21 2239)  carvedilol (COREG) tablet 25 mg (25 mg Oral Given 02/10/21 0815)  losartan (COZAAR) tablet 25 mg (has no administration in time range)  spironolactone (ALDACTONE) tablet 25 mg (has no administration  in time range)  busPIRone (BUSPAR) tablet 10 mg (10 mg Oral Given 02/10/21 0551)  hydrOXYzine (ATARAX/VISTARIL) tablet 25 mg (25 mg Oral Given 02/10/21 0219)  nicotine (NICODERM CQ - dosed in mg/24 hours) patch 21 mg (21 mg Transdermal Patch Applied 02/09/21 2044)  traZODone (DESYREL) tablet 150 mg (150 mg Oral Given 02/09/21 2240)  cholecalciferol (VITAMIN D3) tablet 2,000 Units (has no administration in time range)  multivitamin with minerals tablet 1 tablet (has no administration in time range)  albuterol (PROVENTIL) (2.5 MG/3ML) 0.083% nebulizer solution 3 mL (has no administration in time range)  sodium chloride flush (NS) 0.9 % injection 3 mL (3 mLs Intravenous Given 02/09/21 2241)  sodium chloride flush (NS) 0.9 % injection 3 mL (3 mLs Intravenous Given 02/09/21 2242)  sodium chloride flush (NS) 0.9 % injection 3 mL (has no administration in time range)  0.9 %  sodium chloride infusion (has no administration in time range)  acetaminophen (TYLENOL) tablet 650 mg (650 mg Oral Given 02/10/21 0602)    Or  acetaminophen (TYLENOL) suppository 650 mg ( Rectal See Alternative 02/10/21 0602)  polyethylene glycol (MIRALAX / GLYCOLAX) packet 17 g (has no administration in time range)  bisacodyl (DULCOLAX) suppository 10 mg (has no administration in time range)  ondansetron (ZOFRAN) tablet 4 mg ( Oral See Alternative 02/10/21 0814)    Or  ondansetron (ZOFRAN) injection 4 mg (4 mg Intravenous Given 02/10/21 0814)  heparin injection 5,000 Units (5,000 Units Subcutaneous Given 02/10/21 0551)  magnesium sulfate IVPB 2 g 50 mL (has no administration in time range)  0.9 %  sodium chloride infusion (has no administration in time range)  cloNIDine (CATAPRES - Dosed in mg/24 hr) patch 0.3 mg (has no administration in time range)  sodium chloride 0.9 % bolus 1,000 mL (0 mLs Intravenous Stopped 02/09/21 1354)  LORazepam (ATIVAN) injection 1 mg (1 mg Intravenous Given 02/09/21 1137)  LORazepam (ATIVAN) injection 2 mg (2  mg Intravenous Given 02/09/21 1352)  pantoprazole (PROTONIX) injection 80 mg (80 mg Intravenous Given 02/09/21 1610)  sodium chloride 0.9 % bolus 1,000 mL (0 mLs Intravenous Stopped 02/09/21 1913)  magnesium sulfate IVPB 2 g 50 mL (0 g Intravenous Stopped 02/09/21 2245)    Mobility walks Moderate fall risk   Focused Assessments    R Recommendations: See Admitting Provider Note  Report given to:   Additional Notes:

## 2021-02-10 NOTE — Discharge Summary (Signed)
Physician Discharge Summary  KAYDE WAREHIME VWP:[id number] DOB: 04-Aug-1971 DOA: 02/09/2021  PCP: Redmond School, MD  Admit date: 02/09/2021 Discharge date: 02/10/2021  Admitted From:  Home  Disposition:  Home   Recommendations for Outpatient Follow-up:  1. Follow up with PCP in 1 weeks  Discharge Condition: STABLE   CODE STATUS: FULL DIET: soft food diet recommended   Brief Hospitalization Summary: Please see all hospital notes, images, labs for full details of the hospitalization. ADMISSION HPI:  Kaoru Rezendes  is a 50 y.o. male  with medical history significant for Barrett's esophagus, GERD, alcoholic gastritis and PUD with gastric ulcers, HTN, tobacco abuse, prior history of Wolff-Parkinson-White syndrome, fatty liver disease, chronic combined CHF,alcohol abuse with heavy use (ongoing), prior history of coronary disease (50% stenosis small diagonal)--with nausea vomiting and epigastric pain after excessive drinking of vodka-- -denies hematemesis or melena this time around -On further questioning patient specifically denies chest pains or pleuritic symptoms -Patient was found to have tachycardia, tachypnea elevated BP and significant tremors with elevated CIWA score required IV lorazepam--despite these measures---  Patient remained tachycardic with elevated CIWA score -Chest x-ray without acute cardiopulmonary finding, COVID negative -CT abdomen and pelvis without acute findings patient does have fatty liver -Sodium is 131, chloride is 95 creatinine 1.21, T bili is 2.2 with AST of 74 and AST of 77 -BAL 11, CBC WNL -Lipase is 103 -Troponins 34 repeat troponin 31, EKG sinus rhythm with right bundle branch block  Hospital Course   Pt was admitted for observation for his abdominal pain related to Barrett's esophagus, with GERD and chronic alcoholism.  He was treated with IV Protonix and gentle IV fluid hydration.  CT scan was unremarkable with the exception of fatty liver infiltration  which has been well known.  He also had an abdominal ultrasound that also showed fatty liver but no acute findings.  He was placed on a full liquid diet advance to soft foods diet and he is encouraged to go home on a soft foods diet.  He is tolerating diet.  He was noted to be stable with abdominal pain improved and no acute findings.  His magnesium was replaced IV.  He was given counseling on alcohol cessation.  The ED provider had sent him a prescription for Librium to the pharmacy yesterday.  He was advised to take Protonix 40 mg twice daily.  He was advised to follow-up with his PCP.  Ambulatory referral to GI.  Discharge Diagnoses:  Principal Problem:   Alcohol withdrawal/DTs Active Problems:   Essential hypertension   CAD (coronary artery disease)   History of Wolff-Parkinson-White syndrome   Tobacco abuse   GERD (gastroesophageal reflux disease)   ETOH abuse   Chronic combined systolic (congestive) and diastolic (congestive) heart failure (HCC)   Esophagitis-etoh and NSAID related   PUD (peptic ulcer disease)/Gastric Ulcers---etoh and Nsaid related   Barrett's esophagus without dysplasia   Discharge Instructions: Discharge Instructions    Ambulatory referral to Gastroenterology   Complete by: As directed      Allergies as of 02/10/2021      Reactions   Morphine Other (See Comments)   Makes overly sleepy      Medication List    STOP taking these medications   acetaminophen 325 MG tablet Commonly known as: TYLENOL   dexlansoprazole 60 MG capsule Commonly known as: DEXILANT   Ventolin HFA 108 (90 Base) MCG/ACT inhaler Generic drug: albuterol     TAKE these medications   atorvastatin 10 MG  tablet Commonly known as: LIPITOR Take 1 tablet (10 mg total) by mouth at bedtime.   busPIRone 10 MG tablet Commonly known as: BUSPAR Take 1 tablet (10 mg total) by mouth 3 (three) times daily. For anxiety   carvedilol 25 MG tablet Commonly known as: COREG Take 1 tablet (25  mg total) by mouth 2 (two) times daily with a meal.   chlordiazePOXIDE 25 MG capsule Commonly known as: LIBRIUM Take 2 capsules (50 mg total) by mouth 3 (three) times daily as needed for anxiety. 50mg  by mouth q 6 h day 1 50mg  by mouth q 8 h day 2 50mg  by mouth q 12 h day 3 50mg  by mough QHS day 4   cloNIDine 0.2 MG tablet Commonly known as: CATAPRES Take 1 tablet (0.2 mg total) by mouth 2 (two) times daily as needed (BP >150/90).   cloNIDine 0.3 mg/24hr patch Commonly known as: CATAPRES - Dosed in mg/24 hr Place 1 patch (0.3 mg total) onto the skin once a week.   fluticasone 50 MCG/ACT nasal spray Commonly known as: FLONASE Place into both nostrils.   folic acid 1 MG tablet Commonly known as: FOLVITE Take 1 tablet (1 mg total) by mouth daily.   furosemide 40 MG tablet Commonly known as: LASIX Take 1 tablet (40 mg total) by mouth daily. Start taking on: Feb 12, 2021 What changed: These instructions start on Feb 12, 2021. If you are unsure what to do until then, ask your doctor or other care provider.   hydrOXYzine 25 MG tablet Commonly known as: ATARAX/VISTARIL Take 1 tablet (25 mg total) by mouth 3 (three) times daily as needed for anxiety or nausea.   losartan 25 MG tablet Commonly known as: COZAAR Take 1 tablet (25 mg total) by mouth daily.   multivitamin with minerals Tabs tablet Take 1 tablet by mouth daily.   nicotine 14 mg/24hr patch Commonly known as: NICODERM CQ - dosed in mg/24 hours Place 1 patch (14 mg total) onto the skin daily.   oxyCODONE 15 MG immediate release tablet Commonly known as: ROXICODONE Take 15 mg by mouth every 4 (four) hours as needed for pain.   pantoprazole 40 MG tablet Commonly known as: PROTONIX Take 1 tablet (40 mg total) by mouth 2 (two) times daily. What changed: Another medication with the same name was removed. Continue taking this medication, and follow the directions you see here.   spironolactone 25 MG tablet Commonly  known as: ALDACTONE Take 1 tablet (25 mg total) by mouth every morning.   sucralfate 1 g tablet Commonly known as: CARAFATE Take 1 tablet (1 g total) by mouth 4 (four) times daily -  with meals and at bedtime for 14 days.   thiamine 100 MG tablet Take 1 tablet (100 mg total) by mouth daily.   traZODone 150 MG tablet Commonly known as: DESYREL Take 1 tablet (150 mg total) by mouth at bedtime. For Sleep and Anxiety   Vitamin D3 25 MCG (1000 UT) Caps Take 2 capsules by mouth daily.       Follow-up Information    [facility] EMERGENCY DEPARTMENT.   Specialty: Emergency Medicine Why: If symptoms worsen Contact information: [address] [id number] Prudy Feeler [place]       Redmond School, MD In 2 days.   Specialty: Internal Medicine Contact information: [address] [place] [address] [phone]              Allergies  Allergen Reactions  .  Morphine Other (See Comments)    Makes overly sleepy   Allergies as of 02/10/2021      Reactions   Morphine Other (See Comments)   Makes overly sleepy      Medication List    STOP taking these medications   acetaminophen 325 MG tablet Commonly known as: TYLENOL   dexlansoprazole 60 MG capsule Commonly known as: DEXILANT   Ventolin HFA 108 (90 Base) MCG/ACT inhaler Generic drug: albuterol     TAKE these medications   atorvastatin 10 MG tablet Commonly known as: LIPITOR Take 1 tablet (10 mg total) by mouth at bedtime.   busPIRone 10 MG tablet Commonly known as: BUSPAR Take 1 tablet (10 mg total) by mouth 3 (three) times daily. For anxiety   carvedilol 25 MG tablet Commonly known as: COREG Take 1 tablet (25 mg total) by mouth 2 (two) times daily with a meal.   chlordiazePOXIDE 25 MG capsule Commonly known as: LIBRIUM Take 2 capsules (50 mg total) by mouth 3 (three) times daily as needed for anxiety. 50mg  by mouth q 6 h day 1 50mg  by mouth q 8 h day 2 50mg  by  mouth q 12 h day 3 50mg  by mough QHS day 4   cloNIDine 0.2 MG tablet Commonly known as: CATAPRES Take 1 tablet (0.2 mg total) by mouth 2 (two) times daily as needed (BP >150/90).   cloNIDine 0.3 mg/24hr patch Commonly known as: CATAPRES - Dosed in mg/24 hr Place 1 patch (0.3 mg total) onto the skin once a week.   fluticasone 50 MCG/ACT nasal spray Commonly known as: FLONASE Place into both nostrils.   folic acid 1 MG tablet Commonly known as: FOLVITE Take 1 tablet (1 mg total) by mouth daily.   furosemide 40 MG tablet Commonly known as: LASIX Take 1 tablet (40 mg total) by mouth daily. Start taking on: Feb 12, 2021 What changed: These instructions start on Feb 12, 2021. If you are unsure what to do until then, ask your doctor or other care provider.   hydrOXYzine 25 MG tablet Commonly known as: ATARAX/VISTARIL Take 1 tablet (25 mg total) by mouth 3 (three) times daily as needed for anxiety or nausea.   losartan 25 MG tablet Commonly known as: COZAAR Take 1 tablet (25 mg total) by mouth daily.   multivitamin with minerals Tabs tablet Take 1 tablet by mouth daily.   nicotine 14 mg/24hr patch Commonly known as: NICODERM CQ - dosed in mg/24 hours Place 1 patch (14 mg total) onto the skin daily.   oxyCODONE 15 MG immediate release tablet Commonly known as: ROXICODONE Take 15 mg by mouth every 4 (four) hours as needed for pain.   pantoprazole 40 MG tablet Commonly known as: PROTONIX Take 1 tablet (40 mg total) by mouth 2 (two) times daily. What changed: Another medication with the same name was removed. Continue taking this medication, and follow the directions you see here.   spironolactone 25 MG tablet Commonly known as: ALDACTONE Take 1 tablet (25 mg total) by mouth every morning.   sucralfate 1 g tablet Commonly known as: CARAFATE Take 1 tablet (1 g total) by mouth 4 (four) times daily -  with meals and at bedtime for 14 days.   thiamine 100 MG tablet Take 1  tablet (100 mg total) by mouth daily.   traZODone 150 MG tablet Commonly known as: DESYREL Take 1 tablet (150 mg total) by mouth at bedtime. For Sleep and Anxiety   Vitamin D3  25 MCG (1000 UT) Caps Take 2 capsules by mouth daily.       Procedures/Studies: CT ABDOMEN PELVIS WO CONTRAST  Result Date: 02/09/2021 CLINICAL DATA:  Nausea and vomiting with chest pain, initial encounter EXAM: CT ABDOMEN AND PELVIS WITHOUT CONTRAST TECHNIQUE: Multidetector CT imaging of the abdomen and pelvis was performed following the standard protocol without IV contrast. COMPARISON:  03/07/2013 FINDINGS: Lower chest: No acute abnormality. Hepatobiliary: Fatty infiltration of the liver is noted. The gallbladder is within normal limits. No biliary ductal dilatation is seen. Pancreas: Unremarkable. No pancreatic ductal dilatation or surrounding inflammatory changes. Spleen: Normal in size without focal abnormality. Adrenals/Urinary Tract: Adrenal glands are within normal limits. Kidneys are well visualized bilaterally. No renal calculi or obstructive changes are noted. A cyst is noted within the left kidney stable from the prior exam. Bladder is within normal limits. Stomach/Bowel: Appendix is not visualized consistent with a prior surgical history. No obstructive or inflammatory changes of the colon are seen. Stomach and small bowel are within normal limits. Vascular/Lymphatic: Aortic atherosclerosis. No enlarged abdominal or pelvic lymph nodes. Reproductive: Prostate is unremarkable. Other: No abdominal wall hernia or abnormality. No abdominopelvic ascites. Musculoskeletal: Degenerative changes of lumbar spine are noted. IMPRESSION: Fatty liver. Left renal cyst. No acute abnormality is noted to correspond with the patient's given clinical history. Electronically Signed   By: Inez Catalina M.D.   On: 02/09/2021 15:23   US Abdomen Complete  Result Date: 02/10/2021 CLINICAL DATA:  Pain EXAM: ABDOMEN ULTRASOUND COMPLETE  COMPARISON:  Feb 01, 2021, January eighth, 2016 FINDINGS: Gallbladder: No gallstones or wall thickening visualized. No sonographic Murphy sign noted by sonographer. Common bile duct: Diameter: 4 mm, normal Liver: Diffusely increased hepatic echogenicity. Mildly nodular liver contour. No focal lesion identified. Portal vein is patent on color Doppler imaging with normal direction of blood flow towards the liver. IVC: No abnormality visualized. Pancreas: Not visualized secondary to shadowing bowel gas. Spleen: Size and appearance within normal limits. Right Kidney: Length: 11.6 cm. Echogenicity within normal limits. No mass or hydronephrosis visualized. Left Kidney: Length: 12.1. Echogenicity within normal limits. No hydronephrosis. Revisualization of a cyst in the peripelvic region which measures 16 x 19 by 19 mm. Abdominal aorta: No abdominal aortic aneurysm visualized. Other findings: None. IMPRESSION: 1. Hepatic steatosis. 2. Mildly nodular contour of the liver may reflect underlying cirrhosis. Electronically Signed   By: Valentino Saxon MD   On: 02/10/2021 12:37   DG Chest Portable 1 View  Result Date: 02/09/2021 CLINICAL DATA:  chest pain EXAM: PORTABLE CHEST - 1 VIEW COMPARISON:  12/18/2020 FINDINGS: The left costophrenic angle is excluded from the study. The mediastinal contours are within normal limits. No cardiomegaly. The lungs are clear bilaterally without evidence of focal consolidation, pleural effusion, or pneumothorax. No acute osseous abnormality. IMPRESSION: No acute cardiopulmonary process. Electronically Signed   By: Ruthann Cancer MD   On: 02/09/2021 11:40     Subjective: Pt has been tolerating the diet much better and abdominal pain is subsiding. No emesis.  No "shakes"   Discharge Exam: Vitals:   02/10/21 0830 02/10/21 0923  BP:  (!) 131/96  Pulse:  91  Resp: 18 18  Temp: 98 F (36.7 C) 98.1 F (36.7 C)  SpO2:  97%   Vitals:   02/10/21 0755 02/10/21 0800 02/10/21 0830  02/10/21 0923  BP: (!) 127/96   (!) 131/96  Pulse: (!) 104   91  Resp:  18 18 18   Temp:   98  F (36.7 C) 98.1 F (36.7 C)  TempSrc:   Oral Oral  SpO2:    97%  Weight:      Height:        General: Pt is alert, awake, not in acute distress Cardiovascular: normal S1/S2 +, no rubs, no gallops Respiratory: CTA bilaterally, no wheezing, no rhonchi Abdominal: Soft, NT, ND, bowel sounds + Extremities: no edema, no cyanosis   The results of significant diagnostics from this hospitalization (including imaging, microbiology, ancillary and laboratory) are listed below for reference.     Microbiology: Recent Results (from the past 240 hour(s))  Resp Panel by RT-PCR (Flu A&B, Covid) Nasopharyngeal Swab     Status: None   Collection Time: 02/09/21  3:18 PM   Specimen: Nasopharyngeal Swab; Nasopharyngeal(NP) swabs in vial transport medium  Result Value Ref Range Status   SARS Coronavirus 2 by RT PCR NEGATIVE NEGATIVE Final    Comment: (NOTE) SARS-CoV-2 target nucleic acids are NOT DETECTED.  The SARS-CoV-2 RNA is generally detectable in upper respiratory specimens during the acute phase of infection. The lowest concentration of SARS-CoV-2 viral copies this assay can detect is 138 copies/mL. A negative result does not preclude SARS-Cov-2 infection and should not be used as the sole basis for treatment or other patient management decisions. A negative result may occur with  improper specimen collection/handling, submission of specimen other than nasopharyngeal swab, presence of viral mutation(s) within the areas targeted by this assay, and inadequate number of viral copies(<138 copies/mL). A negative result must be combined with clinical observations, patient history, and epidemiological information. The expected result is Negative.  Fact Sheet for Patients:  EntrepreneurPulse.com.au  Fact Sheet for Healthcare Providers:   IncredibleEmployment.be  This test is no t yet approved or cleared by the [place] FDA and  has been authorized for detection and/or diagnosis of SARS-CoV-2 by FDA under an Emergency Use Authorization (EUA). This EUA will remain  in effect (meaning this test can be used) for the duration of the COVID-19 declaration under Section 564(b)(1) of the Act, 21 U.S.C.section 360bbb-3(b)(1), unless the authorization is terminated  or revoked sooner.       Influenza A by PCR NEGATIVE NEGATIVE Final   Influenza B by PCR NEGATIVE NEGATIVE Final    Comment: (NOTE) The Xpert Xpress SARS-CoV-2/FLU/RSV plus assay is intended as an aid in the diagnosis of influenza from Nasopharyngeal swab specimens and should not be used as a sole basis for treatment. Nasal washings and aspirates are unacceptable for Xpert Xpress SARS-CoV-2/FLU/RSV testing.  Fact Sheet for Patients: EntrepreneurPulse.com.au  Fact Sheet for Healthcare Providers: IncredibleEmployment.be  This test is not yet approved or cleared by the [place] FDA and has been authorized for detection and/or diagnosis of SARS-CoV-2 by FDA under an Emergency Use Authorization (EUA). This EUA will remain in effect (meaning this test can be used) for the duration of the COVID-19 declaration under Section 564(b)(1) of the Act, 21 U.S.C. section 360bbb-3(b)(1), unless the authorization is terminated or revoked.  Performed at [facility], [address], [place], [place] [address]      Labs: BNP (last 3 results) Recent Labs    12/18/20 1500  BNP 536.1*   Basic Metabolic Panel: Recent Labs  Lab 02/09/21 1122 02/09/21 2056 02/10/21 0658  NA 131*  --  130*  K 3.7  --  3.7  CL 95*  --  97*  CO2 22  --  23  GLUCOSE 125*  --  94  BUN 18  --  19  CREATININE 1.21  --  1.01  CALCIUM 9.7  --  9.1  MG  --  1.6*  --    Liver Function Tests: Recent Labs  Lab 02/09/21 1122  02/10/21 0658  AST 74* 62*  ALT 77* 67*  ALKPHOS 83 78  BILITOT 2.2* 2.8*  PROT 8.3* 7.1  ALBUMIN 4.7 4.0   Recent Labs  Lab 02/09/21 1134  LIPASE 103*   No results for input(s): AMMONIA in the last 168 hours. CBC: Recent Labs  Lab 02/09/21 1122 02/10/21 0658  WBC 7.4 5.9  NEUTROABS 4.9  --   HGB 16.0 14.4  HCT 45.6 42.7  MCV 93.3 97.3  PLT 235 177   Cardiac Enzymes: No results for input(s): CKTOTAL, CKMB, CKMBINDEX, TROPONINI in the last 168 hours. BNP: Invalid input(s): POCBNP CBG: No results for input(s): GLUCAP in the last 168 hours. D-Dimer No results for input(s): DDIMER in the last 72 hours. Hgb A1c No results for input(s): HGBA1C in the last 72 hours. Lipid Profile No results for input(s): CHOL, HDL, LDLCALC, TRIG, CHOLHDL, LDLDIRECT in the last 72 hours. Thyroid function studies No results for input(s): TSH, T4TOTAL, T3FREE, THYROIDAB in the last 72 hours.  Invalid input(s): FREET3 Anemia work up No results for input(s): VITAMINB12, FOLATE, FERRITIN, TIBC, IRON, RETICCTPCT in the last 72 hours. Urinalysis    Component Value Date/Time   COLORURINE YELLOW 04/11/2016 1044   APPEARANCEUR CLEAR 04/11/2016 1044   LABSPEC 1.011 04/11/2016 1044   PHURINE 7.5 04/11/2016 1044   GLUCOSEU NEGATIVE 04/11/2016 1044   HGBUR NEGATIVE 04/11/2016 1044   BILIRUBINUR NEGATIVE 04/11/2016 1044   KETONESUR NEGATIVE 04/11/2016 1044   PROTEINUR NEGATIVE 04/11/2016 1044   UROBILINOGEN 0.2 10/29/2014 0937   NITRITE NEGATIVE 04/11/2016 1044   LEUKOCYTESUR NEGATIVE 04/11/2016 1044   Sepsis Labs Invalid input(s): PROCALCITONIN,  WBC,  LACTICIDVEN Microbiology Recent Results (from the past 240 hour(s))  Resp Panel by RT-PCR (Flu A&B, Covid) Nasopharyngeal Swab     Status: None   Collection Time: 02/09/21  3:18 PM   Specimen: Nasopharyngeal Swab; Nasopharyngeal(NP) swabs in vial transport medium  Result Value Ref Range Status   SARS Coronavirus 2 by RT PCR NEGATIVE  NEGATIVE Final    Comment: (NOTE) SARS-CoV-2 target nucleic acids are NOT DETECTED.  The SARS-CoV-2 RNA is generally detectable in upper respiratory specimens during the acute phase of infection. The lowest concentration of SARS-CoV-2 viral copies this assay can detect is 138 copies/mL. A negative result does not preclude SARS-Cov-2 infection and should not be used as the sole basis for treatment or other patient management decisions. A negative result may occur with  improper specimen collection/handling, submission of specimen other than nasopharyngeal swab, presence of viral mutation(s) within the areas targeted by this assay, and inadequate number of viral copies(<138 copies/mL). A negative result must be combined with clinical observations, patient history, and epidemiological information. The expected result is Negative.  Fact Sheet for Patients:  EntrepreneurPulse.com.au  Fact Sheet for Healthcare Providers:  IncredibleEmployment.be  This test is no t yet approved or cleared by the [place] FDA and  has been authorized for detection and/or diagnosis of SARS-CoV-2 by FDA under an Emergency Use Authorization (EUA). This EUA will remain  in effect (meaning this test can be used) for the duration of the COVID-19 declaration under Section 564(b)(1) of the Act, 21 U.S.C.section 360bbb-3(b)(1), unless the authorization is terminated  or revoked sooner.       Influenza A by PCR NEGATIVE  NEGATIVE Final   Influenza B by PCR NEGATIVE NEGATIVE Final    Comment: (NOTE) The Xpert Xpress SARS-CoV-2/FLU/RSV plus assay is intended as an aid in the diagnosis of influenza from Nasopharyngeal swab specimens and should not be used as a sole basis for treatment. Nasal washings and aspirates are unacceptable for Xpert Xpress SARS-CoV-2/FLU/RSV testing.  Fact Sheet for Patients: EntrepreneurPulse.com.au  Fact Sheet for Healthcare  Providers: IncredibleEmployment.be  This test is not yet approved or cleared by the [place] FDA and has been authorized for detection and/or diagnosis of SARS-CoV-2 by FDA under an Emergency Use Authorization (EUA). This EUA will remain in effect (meaning this test can be used) for the duration of the COVID-19 declaration under Section 564(b)(1) of the Act, 21 U.S.C. section 360bbb-3(b)(1), unless the authorization is terminated or revoked.  Performed at [facility], [address] [place], [place] [address]    Time coordinating discharge:   SIGNED:  Irwin Brakeman, MD  [facility] 02/10/2021, 1:07 PM How to contact the [facility] Attending or Consulting provider [place] or covering provider during after hours [place], for this patient?  1. Check the care team in [facility] and look for a) attending/consulting [facility] provider listed and b) the [facility] team listed 2. Log into [email or web address] and use [place]'s universal password to access. If you do not have the password, please contact the hospital operator. 3. Locate the [facility] provider you are looking for under [facility] and page to a number that you can be directly reached. 4. If you still have difficulty reaching the provider, please page the [facility] (Director on Call) for the Hospitalists listed on amion for assistance.

## 2021-02-10 NOTE — ED Notes (Signed)
Pt appears with increased anxiety, agitation, restlessness, and states worsening pain in his back. CIWA obtained early with MD made aware of results as well as pain complaints.

## 2021-02-10 NOTE — Plan of Care (Signed)

## 2021-02-18 ENCOUNTER — Encounter: Payer: Self-pay | Admitting: *Deleted

## 2021-02-18 ENCOUNTER — Other Ambulatory Visit (HOSPITAL_COMMUNITY): Payer: Self-pay | Admitting: Internal Medicine

## 2021-02-18 DIAGNOSIS — R059 Cough, unspecified: Secondary | ICD-10-CM

## 2021-03-20 DIAGNOSIS — I10 Essential (primary) hypertension: Secondary | ICD-10-CM | POA: Diagnosis not present

## 2021-03-20 DIAGNOSIS — F331 Major depressive disorder, recurrent, moderate: Secondary | ICD-10-CM | POA: Diagnosis not present

## 2021-03-20 DIAGNOSIS — Z6832 Body mass index (BMI) 32.0-32.9, adult: Secondary | ICD-10-CM | POA: Diagnosis not present

## 2021-03-20 DIAGNOSIS — R944 Abnormal results of kidney function studies: Secondary | ICD-10-CM | POA: Diagnosis not present

## 2021-03-20 DIAGNOSIS — K219 Gastro-esophageal reflux disease without esophagitis: Secondary | ICD-10-CM | POA: Diagnosis not present

## 2021-03-20 DIAGNOSIS — G894 Chronic pain syndrome: Secondary | ICD-10-CM | POA: Diagnosis not present

## 2021-03-20 DIAGNOSIS — E6609 Other obesity due to excess calories: Secondary | ICD-10-CM | POA: Diagnosis not present

## 2021-03-20 DIAGNOSIS — I110 Hypertensive heart disease with heart failure: Secondary | ICD-10-CM | POA: Diagnosis not present

## 2021-04-09 ENCOUNTER — Ambulatory Visit: Payer: Medicare Other | Admitting: Gastroenterology

## 2021-04-09 ENCOUNTER — Encounter: Payer: Self-pay | Admitting: Internal Medicine

## 2021-04-24 DIAGNOSIS — G894 Chronic pain syndrome: Secondary | ICD-10-CM | POA: Diagnosis not present

## 2021-05-22 DIAGNOSIS — E6609 Other obesity due to excess calories: Secondary | ICD-10-CM | POA: Diagnosis not present

## 2021-05-22 DIAGNOSIS — J22 Unspecified acute lower respiratory infection: Secondary | ICD-10-CM | POA: Diagnosis not present

## 2021-05-22 DIAGNOSIS — G894 Chronic pain syndrome: Secondary | ICD-10-CM | POA: Diagnosis not present

## 2021-05-22 DIAGNOSIS — Z6831 Body mass index (BMI) 31.0-31.9, adult: Secondary | ICD-10-CM | POA: Diagnosis not present

## 2021-06-18 DIAGNOSIS — J329 Chronic sinusitis, unspecified: Secondary | ICD-10-CM | POA: Diagnosis not present

## 2021-06-18 DIAGNOSIS — G894 Chronic pain syndrome: Secondary | ICD-10-CM | POA: Diagnosis not present

## 2021-07-17 DIAGNOSIS — R058 Other specified cough: Secondary | ICD-10-CM | POA: Diagnosis not present

## 2021-07-17 DIAGNOSIS — G894 Chronic pain syndrome: Secondary | ICD-10-CM | POA: Diagnosis not present

## 2021-08-08 ENCOUNTER — Emergency Department (HOSPITAL_COMMUNITY): Payer: Medicare Other

## 2021-08-08 ENCOUNTER — Other Ambulatory Visit: Payer: Self-pay

## 2021-08-08 ENCOUNTER — Encounter (HOSPITAL_COMMUNITY): Payer: Self-pay | Admitting: Emergency Medicine

## 2021-08-08 ENCOUNTER — Emergency Department (HOSPITAL_COMMUNITY)
Admission: EM | Admit: 2021-08-08 | Discharge: 2021-08-08 | Disposition: A | Discharge status: Home / Self Care | Payer: Medicare Other | Attending: Emergency Medicine | Admitting: Emergency Medicine

## 2021-08-08 DIAGNOSIS — I5042 Chronic combined systolic (congestive) and diastolic (congestive) heart failure: Secondary | ICD-10-CM | POA: Insufficient documentation

## 2021-08-08 DIAGNOSIS — R7401 Elevation of levels of liver transaminase levels: Secondary | ICD-10-CM | POA: Insufficient documentation

## 2021-08-08 DIAGNOSIS — I2510 Atherosclerotic heart disease of native coronary artery without angina pectoris: Secondary | ICD-10-CM | POA: Diagnosis not present

## 2021-08-08 DIAGNOSIS — R112 Nausea with vomiting, unspecified: Secondary | ICD-10-CM | POA: Insufficient documentation

## 2021-08-08 DIAGNOSIS — I10 Essential (primary) hypertension: Secondary | ICD-10-CM

## 2021-08-08 DIAGNOSIS — Z951 Presence of aortocoronary bypass graft: Secondary | ICD-10-CM | POA: Diagnosis not present

## 2021-08-08 DIAGNOSIS — R7989 Other specified abnormal findings of blood chemistry: Secondary | ICD-10-CM

## 2021-08-08 DIAGNOSIS — Z79899 Other long term (current) drug therapy: Secondary | ICD-10-CM | POA: Diagnosis not present

## 2021-08-08 DIAGNOSIS — R0602 Shortness of breath: Secondary | ICD-10-CM | POA: Insufficient documentation

## 2021-08-08 DIAGNOSIS — R079 Chest pain, unspecified: Secondary | ICD-10-CM

## 2021-08-08 DIAGNOSIS — I110 Hypertensive heart disease with heart failure: Secondary | ICD-10-CM | POA: Insufficient documentation

## 2021-08-08 DIAGNOSIS — R945 Abnormal results of liver function studies: Secondary | ICD-10-CM | POA: Diagnosis not present

## 2021-08-08 DIAGNOSIS — R0789 Other chest pain: Secondary | ICD-10-CM | POA: Diagnosis not present

## 2021-08-08 DIAGNOSIS — F17210 Nicotine dependence, cigarettes, uncomplicated: Secondary | ICD-10-CM | POA: Diagnosis not present

## 2021-08-08 DIAGNOSIS — R000 Tachycardia, unspecified: Secondary | ICD-10-CM | POA: Insufficient documentation

## 2021-08-08 LAB — COMPREHENSIVE METABOLIC PANEL
ALT: 104 U/L — ABNORMAL HIGH (ref 0–44)
AST: 127 U/L — ABNORMAL HIGH (ref 15–41)
Albumin: 4.3 g/dL (ref 3.5–5.0)
Alkaline Phosphatase: 110 U/L (ref 38–126)
Anion gap: 18 — ABNORMAL HIGH (ref 5–15)
BUN: 13 mg/dL (ref 6–20)
CO2: 22 mmol/L (ref 22–32)
Calcium: 9.6 mg/dL (ref 8.9–10.3)
Chloride: 93 mmol/L — ABNORMAL LOW (ref 98–111)
Creatinine, Ser: 1.45 mg/dL — ABNORMAL HIGH (ref 0.61–1.24)
GFR, Estimated: 58 mL/min — ABNORMAL LOW (ref >60)
Glucose, Bld: 127 mg/dL — ABNORMAL HIGH (ref 70–99)
Potassium: 3.6 mmol/L (ref 3.5–5.1)
Sodium: 133 mmol/L — ABNORMAL LOW (ref 135–145)
Total Bilirubin: 1.7 mg/dL — ABNORMAL HIGH (ref 0.3–1.2)
Total Protein: 7.8 g/dL (ref 6.5–8.1)

## 2021-08-08 LAB — CBC WITH DIFFERENTIAL/PLATELET
Abs Immature Granulocytes: 0.05 10*3/uL (ref 0.00–0.07)
Basophils Absolute: 0.1 10*3/uL (ref 0.0–0.1)
Basophils Relative: 1 %
Eosinophils Absolute: 0 10*3/uL (ref 0.0–0.5)
Eosinophils Relative: 0 %
HCT: 49.1 % (ref 39.0–52.0)
Hemoglobin: 16.9 g/dL (ref 13.0–17.0)
Immature Granulocytes: 1 %
Lymphocytes Relative: 12 %
Lymphs Abs: 1.2 10*3/uL (ref 0.7–4.0)
MCH: 31.9 pg (ref 26.0–34.0)
MCHC: 34.4 g/dL (ref 30.0–36.0)
MCV: 92.6 fL (ref 80.0–100.0)
Monocytes Absolute: 0.7 10*3/uL (ref 0.1–1.0)
Monocytes Relative: 7 %
Neutro Abs: 8.6 10*3/uL — ABNORMAL HIGH (ref 1.7–7.7)
Neutrophils Relative %: 79 %
Platelets: 321 10*3/uL (ref 150–400)
RBC: 5.3 MIL/uL (ref 4.22–5.81)
RDW: 15.1 % (ref 11.5–15.5)
WBC: 10.7 10*3/uL — ABNORMAL HIGH (ref 4.0–10.5)
nRBC: 0 % (ref 0.0–0.2)

## 2021-08-08 LAB — TROPONIN I (HIGH SENSITIVITY)
Troponin I (High Sensitivity): 30 ng/L — ABNORMAL HIGH (ref <18)
Troponin I (High Sensitivity): 32 ng/L — ABNORMAL HIGH (ref <18)

## 2021-08-08 LAB — MAGNESIUM: Magnesium: 1.5 mg/dL — ABNORMAL LOW (ref 1.7–2.4)

## 2021-08-08 LAB — TSH: TSH: 3.684 u[IU]/mL (ref 0.350–4.500)

## 2021-08-08 LAB — LIPASE, BLOOD: Lipase: 51 U/L (ref 11–51)

## 2021-08-08 MED ORDER — LORAZEPAM 2 MG/ML IJ SOLN
1.0000 mg | Freq: Once | INTRAMUSCULAR | Status: AC
  Administered 2021-08-08: 1 mg via INTRAVENOUS
  Filled 2021-08-08: qty 1

## 2021-08-08 MED ORDER — SODIUM CHLORIDE 0.9 % IV BOLUS
500.0000 mL | Freq: Once | INTRAVENOUS | Status: AC
  Administered 2021-08-08: 500 mL via INTRAVENOUS

## 2021-08-08 MED ORDER — LIDOCAINE VISCOUS HCL 2 % MT SOLN
15.0000 mL | Freq: Once | OROMUCOSAL | Status: AC
  Administered 2021-08-08: 15 mL via ORAL
  Filled 2021-08-08: qty 15

## 2021-08-08 MED ORDER — ACETAMINOPHEN 325 MG PO TABS
650.0000 mg | ORAL_TABLET | Freq: Once | ORAL | Status: AC
  Administered 2021-08-08: 650 mg via ORAL
  Filled 2021-08-08: qty 2

## 2021-08-08 MED ORDER — FENTANYL CITRATE PF 50 MCG/ML IJ SOSY
50.0000 ug | PREFILLED_SYRINGE | Freq: Once | INTRAMUSCULAR | Status: AC
  Administered 2021-08-08: 50 ug via INTRAVENOUS
  Filled 2021-08-08: qty 1

## 2021-08-08 MED ORDER — NICOTINE 21 MG/24HR TD PT24
21.0000 mg | MEDICATED_PATCH | Freq: Once | TRANSDERMAL | Status: DC
  Administered 2021-08-08: 21 mg via TRANSDERMAL
  Filled 2021-08-08: qty 1

## 2021-08-08 MED ORDER — LABETALOL HCL 5 MG/ML IV SOLN
20.0000 mg | Freq: Once | INTRAVENOUS | Status: AC
  Administered 2021-08-08: 20 mg via INTRAVENOUS
  Filled 2021-08-08: qty 4

## 2021-08-08 MED ORDER — ALUM & MAG HYDROXIDE-SIMETH 200-200-20 MG/5ML PO SUSP
30.0000 mL | Freq: Once | ORAL | Status: AC
  Administered 2021-08-08: 30 mL via ORAL
  Filled 2021-08-08: qty 30

## 2021-08-08 MED ORDER — LORAZEPAM 2 MG/ML IJ SOLN
0.5000 mg | Freq: Once | INTRAMUSCULAR | Status: AC
  Administered 2021-08-08: 0.5 mg via INTRAVENOUS
  Filled 2021-08-08: qty 1

## 2021-08-08 MED ORDER — CLONIDINE HCL 0.2 MG PO TABS
0.2000 mg | ORAL_TABLET | Freq: Once | ORAL | Status: AC
  Administered 2021-08-08: 0.2 mg via ORAL
  Filled 2021-08-08: qty 1

## 2021-08-08 MED ORDER — MAGNESIUM SULFATE 2 GM/50ML IV SOLN
2.0000 g | Freq: Once | INTRAVENOUS | Status: AC
  Administered 2021-08-08: 2 g via INTRAVENOUS
  Filled 2021-08-08: qty 50

## 2021-08-08 NOTE — Discharge Instructions (Signed)
You were seen in the emergency department for chest pain along with elevated blood pressure and headache.  You had an EKG chest x-ray along with blood work and there was no evidence of a heart attack.  Your blood pressure and headache improved somewhat with medication here.  Your liver numbers were elevated and this may be related to alcohol use.  Please continue your regular medications and follow-up with your primary care doctor.  Return to the emergency department if any worsening or concerning symptoms.

## 2021-08-08 NOTE — ED Notes (Signed)
Dr aware of bp 

## 2021-08-08 NOTE — ED Provider Notes (Signed)
[facility] Provider Note   CSN: [id number] Arrival date & time: 08/08/21  1506     History No chief complaint on file.   Timothy [name] is a 50 y.o. male.  He said he woke up to urinate early this morning and then was nauseous and vomited.  Since then he has noticed some hollow feeling in his chest along with having 3 more episodes of vomiting.  No abdominal pain.  Mild shortness of breath.  He said he has had this before but never this much.  He admits to 1 beer.  He is a smoker.  No recent illness no diarrhea.  No fevers or chills.  The history is provided by the patient.  Chest Pain Pain location:  L chest Pain quality: aching   Pain radiates to:  Does not radiate Pain severity:  Moderate Onset quality:  Gradual Duration:  10 hours Timing:  Constant Progression:  Unchanged Chronicity:  Recurrent Context: at rest   Relieved by:  None tried Worsened by:  Nothing Ineffective treatments:  None tried Associated symptoms: nausea, shortness of breath and vomiting   Associated symptoms: no abdominal pain, no cough, no fever and no headache       Past Medical History:  Diagnosis Date   Anxiety    Arthritis    CAD (coronary artery disease) 2007   50% stenosis small diagonal   Central sleep apnea    Chronic pain    since 1998 has been on chronic pain medication   Fatty liver    GERD (gastroesophageal reflux disease)    Hemorrhoids    Hepatomegaly 10/03/2014   HTN (hypertension)    Palpitations    Syncope    Tachycardia    Tobacco abuse    Tubular adenoma    Wolff-Parkinson-White (WPW) syndrome    says was cured with ablation    Patient Active Problem List   Diagnosis Date Noted   Barrett's esophagus without dysplasia 02/09/2021   Esophagitis-etoh and NSAID related 12/20/2020   PUD (peptic ulcer disease)/Gastric Ulcers---etoh and Nsaid related 12/20/2020   Alcohol withdrawal/DTs 12/19/2020   Atypical chest pain    Chest pain 12/18/2020    Non-intractable vomiting    Dysphagia    Elevated LFTs    Hyperbilirubinemia    Chronic combined systolic (congestive) and diastolic (congestive) heart failure ([place]) 05/08/2016   Acute CHF (congestive heart failure) ([place]) 04/18/2016   ETOH abuse 04/18/2016   RBBB 04/18/2016   Cardiomyopathy ([place]) 04/18/2016   Family history of coronary artery disease in father 04/18/2016   Dyslipidemia 04/18/2016   Elevated brain natriuretic peptide (BNP) level    Acute on chronic diastolic heart failure ([place])    Interstitial edema    Hemoptysis 04/11/2016   Orthopnea 04/11/2016   Elevated troponin I level    Stroke ([place]) 10/28/2014   Hypertensive emergency 10/28/2014   Left sided numbness 10/28/2014   Hepatomegaly 10/03/2014   GERD (gastroesophageal reflux disease) 07/13/2013   Hemorrhoids 07/13/2013   Abdominal pain, chronic, right upper quadrant 05/03/2013   Bowel habit changes 05/03/2013   FH: colon cancer 05/03/2013   Rectal bleeding 05/03/2013   Hypokalemia 03/08/2013   History of Wolff-Parkinson-White syndrome 03/08/2013   Malignant hypertension 03/08/2013   Polycythemia 03/08/2013   Tobacco abuse 03/08/2013   Chronic anxiety 03/08/2013   Leukocytosis 03/08/2013   Laceration of arm 03/08/2013   Chest tightness 03/08/2013   CAD (coronary artery disease) 08/11/2012   Smoking 08/11/2012   NIGHT SWEATS  10/23/2010   Anxiety state 12/23/2008   Essential hypertension 12/23/2008   SYNCOPE 12/23/2008   Palpitations 12/23/2008   TACHYCARDIA, HX OF 12/23/2008    Past Surgical History:  Procedure Laterality Date   ADENOIDECTOMY     APPENDECTOMY     ATRIAL ABLATION SURGERY     AV NODE ABLATION     BIOPSY N/A 05/12/2013   Procedure: BIOPSY;  Surgeon: Daneil Dolin, MD;  Location: [facility];  Service: Endoscopy;  Laterality: N/A;   BIOPSY  12/20/2020   Procedure: BIOPSY;  Surgeon: Daneil Dolin, MD;  Location: [facility];  Service: Endoscopy;;   CARDIAC CATHETERIZATION N/A 05/08/2016    Procedure: Right/Left Heart Cath and Coronary Angiography;  Surgeon: Peter M Martinique, MD;  Location: [facility];  Service: Cardiovascular;  Laterality: N/A;   CARDIAC ELECTROPHYSIOLOGY STUDY AND ABLATION     SA node ablation   COLONOSCOPY     age 85   COLONOSCOPY WITH PROPOFOL N/A 05/12/2013   Dr. Gala Romney- hemorrhoids, tubular adenoma   ESOPHAGEAL DILATION  12/20/2020   Procedure: ESOPHAGEAL DILATION;  Surgeon: Daneil Dolin, MD;  Location: [facility];  Service: Endoscopy;;   ESOPHAGEAL DILATION N/A 12/20/2020   Procedure: ESOPHAGEAL DILATION;  Surgeon: Daneil Dolin, MD;  Location: [facility];  Service: Endoscopy;  Laterality: N/A;   ESOPHAGOGASTRODUODENOSCOPY (EGD) WITH PROPOFOL N/A 05/12/2013   Dr. Gala Romney- hiatal hernia, erosive reflux esophagitis   ESOPHAGOGASTRODUODENOSCOPY (EGD) WITH PROPOFOL N/A 12/20/2020   Procedure: ESOPHAGOGASTRODUODENOSCOPY (EGD) WITH PROPOFOL;  Surgeon: Daneil Dolin, MD;  [facility];  Service: Endoscopy;  Laterality: N/A;   HAND SURGERY     HEMORRHOID BANDING  06/22/13   POLYPECTOMY N/A 05/12/2013   Procedure: POLYPECTOMY;  Surgeon: Daneil Dolin, MD;  Location: [facility];  Service: Endoscopy;  Laterality: N/A;   SHOULDER SURGERY         Family History  Problem Relation Age of Onset   Coronary artery disease Father        cabg   Colon cancer Father        age 19, chemo/surgery   Breast cancer Mother    Hypertension Mother     Social History   Tobacco Use   Smoking status: Every Day    Packs/day: 0.50    Years: 25.00    Pack years: 12.50    Types: Cigarettes   Smokeless tobacco: Never  Vaping Use   Vaping Use: Never used  Substance Use Topics   Alcohol use: Yes    Comment: daily; 12/18/20-5 days/week, 6 drinks at time   Drug use: Yes    Types: Marijuana    Comment: hx marijuana use    Home Medications Prior to Admission medications   Medication Sig Start Date End Date Taking? Authorizing Provider  atorvastatin  (LIPITOR) 10 MG tablet Take 1 tablet (10 mg total) by mouth at bedtime. 12/20/20   Roxan Hockey, MD  busPIRone (BUSPAR) 10 MG tablet Take 1 tablet (10 mg total) by mouth 3 (three) times daily. For anxiety 12/20/20   Roxan Hockey, MD  carvedilol (COREG) 25 MG tablet Take 1 tablet (25 mg total) by mouth 2 (two) times daily with a meal. 12/20/20   Denton Brick, Courage, MD  chlordiazePOXIDE (LIBRIUM) 25 MG capsule Take 1 capsule (25 mg total) by mouth 3 (three) times daily as needed for anxiety or withdrawal. 02/10/21   Wynetta Emery, Clanford L, MD  Cholecalciferol (VITAMIN D3) 25 MCG (1000 UT) CAPS  Take 2 capsules by mouth daily.    [provider]  cloNIDine (CATAPRES - DOSED IN MG/24 HR) 0.3 mg/24hr patch Place 1 patch (0.3 mg total) onto the skin once a week. 12/20/20   Roxan Hockey, MD  cloNIDine (CATAPRES) 0.2 MG tablet Take 1 tablet (0.2 mg total) by mouth 2 (two) times daily as needed (BP >150/90). 12/20/20   Emokpae, Courage, MD  fluticasone (FLONASE) 50 MCG/ACT nasal spray Place into both nostrils. 12/24/20   [provider]  folic acid (FOLVITE) 1 MG tablet Take 1 tablet (1 mg total) by mouth daily. 12/21/20   Roxan Hockey, MD  furosemide (LASIX) 40 MG tablet Take 1 tablet (40 mg total) by mouth daily. 02/12/21   Johnson, Clanford L, MD  hydrOXYzine (ATARAX/VISTARIL) 25 MG tablet Take 1 tablet (25 mg total) by mouth 3 (three) times daily as needed for anxiety or nausea. 12/20/20   Roxan Hockey, MD  losartan (COZAAR) 25 MG tablet Take 1 tablet (25 mg total) by mouth daily. 12/21/20   Roxan Hockey, MD  Multiple Vitamin (MULTIVITAMIN WITH MINERALS) TABS tablet Take 1 tablet by mouth daily. 12/21/20   Roxan Hockey, MD  nicotine (NICODERM CQ - DOSED IN MG/24 HOURS) 14 mg/24hr patch Place 1 patch (14 mg total) onto the skin daily. 12/21/20   Roxan Hockey, MD  oxyCODONE (ROXICODONE) 15 MG immediate release tablet Take 15 mg by mouth every 4 (four) hours as needed for pain. 03/12/16    [provider]  pantoprazole (PROTONIX) 40 MG tablet Take 1 tablet (40 mg total) by mouth 2 (two) times daily. 02/10/21   Johnson, Clanford L, MD  spironolactone (ALDACTONE) 25 MG tablet Take 1 tablet (25 mg total) by mouth every morning. 12/20/20 03/20/21  Roxan Hockey, MD  sucralfate (CARAFATE) 1 g tablet Take 1 tablet (1 g total) by mouth 4 (four) times daily -  with meals and at bedtime for 14 days. 02/10/21 02/24/21  Murlean Iba, MD  thiamine 100 MG tablet Take 1 tablet (100 mg total) by mouth daily. 12/21/20   Roxan Hockey, MD  traZODone (DESYREL) 150 MG tablet Take 1 tablet (150 mg total) by mouth at bedtime. For Sleep and Anxiety 12/20/20   Roxan Hockey, MD  dexlansoprazole (DEXILANT) 60 MG capsule Take 1 capsule (60 mg total) by mouth daily. 12/25/20 02/09/21  Daneil Dolin, MD    Allergies    Morphine  Review of Systems   Review of Systems  Constitutional:  Negative for fever.  HENT:  Negative for sore throat.   Eyes:  Negative for visual disturbance.  Respiratory:  Positive for shortness of breath. Negative for cough.   Cardiovascular:  Positive for chest pain.  Gastrointestinal:  Positive for nausea and vomiting. Negative for abdominal pain.  Genitourinary:  Negative for dysuria.  Musculoskeletal:  Negative for neck pain.  Skin:  Negative for rash.  Neurological:  Negative for headaches.   Physical Exam Updated Vital Signs BP (!) 194/138   Pulse (!) 121   Temp 98.2 F (36.8 C) (Oral)   Resp (!) 22   Ht 6' (1.829 m)   Wt 108.9 kg   SpO2 97%   BMI 32.55 kg/m   Physical Exam Vitals and nursing note reviewed.  Constitutional:      General: He is not in acute distress.    Appearance: Normal appearance. He is well-developed.  HENT:     Head: Normocephalic and atraumatic.  Eyes:     Conjunctiva/sclera: Conjunctivae normal.  Cardiovascular:     Rate and Rhythm: Regular rhythm. Tachycardia present.     Heart sounds: No murmur heard. Pulmonary:      Effort: Pulmonary effort is normal. No respiratory distress.     Breath sounds: Normal breath sounds.  Abdominal:     Palpations: Abdomen is soft.     Tenderness: There is no abdominal tenderness. There is no guarding or rebound.  Musculoskeletal:        General: No swelling. Normal range of motion.     Cervical back: Neck supple.     Right lower leg: No edema.     Left lower leg: No edema.  Skin:    General: Skin is warm and dry.     Capillary Refill: Capillary refill takes less than 2 seconds.  Neurological:     General: No focal deficit present.     Mental Status: He is alert.  Psychiatric:        Mood and Affect: Mood normal.    ED Results / Procedures / Treatments   Labs (all labs ordered are listed, but only abnormal results are displayed) Labs Reviewed  CBC WITH DIFFERENTIAL/PLATELET - Abnormal; Notable for the following components:      Result Value   WBC 10.7 (*)    Neutro Abs 8.6 (*)    All other components within normal limits  COMPREHENSIVE METABOLIC PANEL - Abnormal; Notable for the following components:   Sodium 133 (*)    Chloride 93 (*)    Glucose, Bld 127 (*)    Creatinine, Ser 1.45 (*)    AST 127 (*)    ALT 104 (*)    Total Bilirubin 1.7 (*)    GFR, Estimated 58 (*)    Anion gap 18 (*)    All other components within normal limits  MAGNESIUM - Abnormal; Notable for the following components:   Magnesium 1.5 (*)    All other components within normal limits  TROPONIN I (HIGH SENSITIVITY) - Abnormal; Notable for the following components:   Troponin I (High Sensitivity) 30 (*)    All other components within normal limits  TROPONIN I (HIGH SENSITIVITY) - Abnormal; Notable for the following components:   Troponin I (High Sensitivity) 32 (*)    All other components within normal limits  LIPASE, BLOOD  TSH    EKG EKG Interpretation  Date/Time:  Thursday August 08 2021 15:19:47 EST Ventricular Rate:  118 PR Interval:  125 QRS Duration: 143 QT  Interval:  369 QTC Calculation: 517 R Axis:   51 Text Interpretation: Sinus tachycardia Probable left atrial enlargement Right bundle branch block No significant change since prior 5/22 Confirmed by Aletta Edouard [phone]) on 08/08/2021 3:31:26 PM  Radiology DG Chest Port 1 View  Result Date: 08/08/2021 CLINICAL DATA:  Acute chest pain and shortness of breath EXAM: PORTABLE CHEST 1 VIEW COMPARISON:  02/09/2021 FINDINGS: UPPER limits normal heart size again noted. There is no evidence of focal airspace disease, pulmonary edema, suspicious pulmonary nodule/mass, pleural effusion, or pneumothorax. No acute bony abnormalities are identified. IMPRESSION: No active disease. Electronically Signed   By: Margarette Canada M.D.   On: 08/08/2021 16:10    Procedures Procedures   Medications Ordered in ED Medications  sodium chloride 0.9 % bolus 500 mL (0 mLs Intravenous Stopped 08/08/21 1638)  LORazepam (ATIVAN) injection 0.5 mg (0.5 mg Intravenous Given 08/08/21 1643)  magnesium sulfate IVPB 2 g 50 mL (0 g Intravenous Stopped 08/08/21 1841)  alum & mag hydroxide-simeth (MAALOX/MYLANTA)  200-200-20 MG/5ML suspension 30 mL (30 mLs Oral Given 08/08/21 1845)    And  lidocaine (XYLOCAINE) 2 % viscous mouth solution 15 mL (15 mLs Oral Given 08/08/21 1845)  LORazepam (ATIVAN) injection 1 mg (1 mg Intravenous Given 08/08/21 1901)  labetalol (NORMODYNE) injection 20 mg (20 mg Intravenous Given 08/08/21 2027)  acetaminophen (TYLENOL) tablet 650 mg (650 mg Oral Given 08/08/21 2026)  fentaNYL (SUBLIMAZE) injection 50 mcg (50 mcg Intravenous Given 08/08/21 2130)  cloNIDine (CATAPRES) tablet 0.2 mg (0.2 mg Oral Given 08/08/21 2226)    ED Course  I have reviewed the triage vital signs and the nursing notes.  Pertinent labs & imaging results that were available during my care of the patient were reviewed by me and considered in my medical decision making (see chart for details).  Clinical Course as of 08/09/21 3748   Thu Aug 08, 2021  2011 Patient states his chest pain is improved.  He still notices it when he moves.  Labs show troponins fairly flat.  Now complaining of headache.  Blood pressure is quite elevated.  Michela Pitcher he has not taken his evening meds. [MB]    Clinical Course User Index [MB] Hayden Rasmussen, MD   MDM Rules/Calculators/A&P                          This patient complains of chest pain nausea vomiting elevated blood pressure tachycardia; this involves an extensive number of treatment Options and is a complaint that carries with it a high risk of complications and Morbidity. The differential includes ACS, pneumonia, pneumothorax, gastritis, peptic ulcer disease, PE, vascular, cholelithiasis, cholecystitis, alcohol withdrawal  I ordered, reviewed and interpreted labs, which included CBC with mildly elevated white count normal hemoglobin, chemistries fairly unremarkable other than elevated creatinine, LFTs elevated have been in the past also, magnesium low, troponins flat, TSH normal I ordered medication IV fluids and magnesium, IV Ativan, watch GI cocktail, labetalol, I ordered imaging studies which included chest x-ray and I independently    visualized and interpreted imaging which showed no acute findings Previous records obtained and reviewed in epic, patient admitted in May for atypical chest pain upper abdominal pain and diagnosed with gastritis and some alcohol withdrawal   After the interventions stated above, I reevaluated the patient and found patient's tachycardia to be improved blood pressure still elevated but improved.  Symptomatically improved.  Recommended alcohol in moderation and following up with his primary care doctor regarding his elevated blood pressure.  Return instructions discussed.   Final Clinical Impression(s) / ED Diagnoses Final diagnoses:  Nonspecific chest pain  Primary hypertension  Elevated LFTs    Rx / DC Orders ED Discharge Orders     None         Hayden Rasmussen, MD 08/09/21 [phone]

## 2021-08-08 NOTE — ED Triage Notes (Signed)
Chest pain started this morning radiates to left arm. Vomited 3 times this morning.

## 2021-08-14 DIAGNOSIS — R058 Other specified cough: Secondary | ICD-10-CM | POA: Diagnosis not present

## 2021-08-14 DIAGNOSIS — F419 Anxiety disorder, unspecified: Secondary | ICD-10-CM | POA: Diagnosis not present

## 2021-08-14 DIAGNOSIS — G894 Chronic pain syndrome: Secondary | ICD-10-CM | POA: Diagnosis not present

## 2021-08-14 DIAGNOSIS — J329 Chronic sinusitis, unspecified: Secondary | ICD-10-CM | POA: Diagnosis not present

## 2021-08-27 ENCOUNTER — Other Ambulatory Visit: Payer: Self-pay

## 2021-08-27 ENCOUNTER — Emergency Department (HOSPITAL_COMMUNITY): Payer: Medicare Other

## 2021-08-27 ENCOUNTER — Emergency Department (HOSPITAL_COMMUNITY)
Admission: EM | Admit: 2021-08-27 | Discharge: 2021-08-27 | Disposition: A | Discharge status: Home / Self Care | Payer: Medicare Other | Attending: Emergency Medicine | Admitting: Emergency Medicine

## 2021-08-27 ENCOUNTER — Encounter (HOSPITAL_COMMUNITY): Payer: Self-pay | Admitting: *Deleted

## 2021-08-27 DIAGNOSIS — I10 Essential (primary) hypertension: Secondary | ICD-10-CM

## 2021-08-27 DIAGNOSIS — Z79899 Other long term (current) drug therapy: Secondary | ICD-10-CM | POA: Insufficient documentation

## 2021-08-27 DIAGNOSIS — R195 Other fecal abnormalities: Secondary | ICD-10-CM | POA: Insufficient documentation

## 2021-08-27 DIAGNOSIS — R000 Tachycardia, unspecified: Secondary | ICD-10-CM | POA: Insufficient documentation

## 2021-08-27 DIAGNOSIS — R109 Unspecified abdominal pain: Secondary | ICD-10-CM | POA: Diagnosis not present

## 2021-08-27 DIAGNOSIS — Z951 Presence of aortocoronary bypass graft: Secondary | ICD-10-CM | POA: Diagnosis not present

## 2021-08-27 DIAGNOSIS — R519 Headache, unspecified: Secondary | ICD-10-CM | POA: Diagnosis not present

## 2021-08-27 DIAGNOSIS — G319 Degenerative disease of nervous system, unspecified: Secondary | ICD-10-CM | POA: Diagnosis not present

## 2021-08-27 DIAGNOSIS — S0990XA Unspecified injury of head, initial encounter: Secondary | ICD-10-CM | POA: Diagnosis not present

## 2021-08-27 DIAGNOSIS — I2510 Atherosclerotic heart disease of native coronary artery without angina pectoris: Secondary | ICD-10-CM | POA: Diagnosis not present

## 2021-08-27 DIAGNOSIS — R0789 Other chest pain: Secondary | ICD-10-CM | POA: Diagnosis not present

## 2021-08-27 DIAGNOSIS — F17210 Nicotine dependence, cigarettes, uncomplicated: Secondary | ICD-10-CM | POA: Diagnosis not present

## 2021-08-27 DIAGNOSIS — S301XXA Contusion of abdominal wall, initial encounter: Secondary | ICD-10-CM

## 2021-08-27 DIAGNOSIS — R42 Dizziness and giddiness: Secondary | ICD-10-CM

## 2021-08-27 DIAGNOSIS — R0602 Shortness of breath: Secondary | ICD-10-CM | POA: Insufficient documentation

## 2021-08-27 DIAGNOSIS — W138XXA Fall from, out of or through other building or structure, initial encounter: Secondary | ICD-10-CM | POA: Diagnosis not present

## 2021-08-27 DIAGNOSIS — R059 Cough, unspecified: Secondary | ICD-10-CM | POA: Diagnosis not present

## 2021-08-27 DIAGNOSIS — S0081XA Abrasion of other part of head, initial encounter: Secondary | ICD-10-CM | POA: Insufficient documentation

## 2021-08-27 DIAGNOSIS — I6529 Occlusion and stenosis of unspecified carotid artery: Secondary | ICD-10-CM | POA: Diagnosis not present

## 2021-08-27 DIAGNOSIS — I517 Cardiomegaly: Secondary | ICD-10-CM | POA: Diagnosis not present

## 2021-08-27 DIAGNOSIS — I5042 Chronic combined systolic (congestive) and diastolic (congestive) heart failure: Secondary | ICD-10-CM | POA: Insufficient documentation

## 2021-08-27 DIAGNOSIS — R1110 Vomiting, unspecified: Secondary | ICD-10-CM | POA: Diagnosis not present

## 2021-08-27 DIAGNOSIS — I110 Hypertensive heart disease with heart failure: Secondary | ICD-10-CM | POA: Insufficient documentation

## 2021-08-27 DIAGNOSIS — R079 Chest pain, unspecified: Secondary | ICD-10-CM

## 2021-08-27 DIAGNOSIS — R112 Nausea with vomiting, unspecified: Secondary | ICD-10-CM | POA: Insufficient documentation

## 2021-08-27 DIAGNOSIS — S3991XA Unspecified injury of abdomen, initial encounter: Secondary | ICD-10-CM | POA: Diagnosis present

## 2021-08-27 DIAGNOSIS — K760 Fatty (change of) liver, not elsewhere classified: Secondary | ICD-10-CM | POA: Diagnosis not present

## 2021-08-27 LAB — BASIC METABOLIC PANEL
Anion gap: 19 — ABNORMAL HIGH (ref 5–15)
BUN: 10 mg/dL (ref 6–20)
CO2: 20 mmol/L — ABNORMAL LOW (ref 22–32)
Calcium: 9.7 mg/dL (ref 8.9–10.3)
Chloride: 98 mmol/L (ref 98–111)
Creatinine, Ser: 1.08 mg/dL (ref 0.61–1.24)
GFR, Estimated: >60 mL/min (ref >60)
Glucose, Bld: 124 mg/dL — ABNORMAL HIGH (ref 70–99)
Potassium: 3.6 mmol/L (ref 3.5–5.1)
Sodium: 137 mmol/L (ref 135–145)

## 2021-08-27 LAB — HEPATIC FUNCTION PANEL
ALT: 104 U/L — ABNORMAL HIGH (ref 0–44)
AST: 98 U/L — ABNORMAL HIGH (ref 15–41)
Albumin: 4.1 g/dL (ref 3.5–5.0)
Alkaline Phosphatase: 101 U/L (ref 38–126)
Bilirubin, Direct: 0.2 mg/dL (ref 0.0–0.2)
Indirect Bilirubin: 1.1 mg/dL — ABNORMAL HIGH (ref 0.3–0.9)
Total Bilirubin: 1.3 mg/dL — ABNORMAL HIGH (ref 0.3–1.2)
Total Protein: 7.5 g/dL (ref 6.5–8.1)

## 2021-08-27 LAB — TROPONIN I (HIGH SENSITIVITY)
Troponin I (High Sensitivity): 59 ng/L — ABNORMAL HIGH (ref <18)
Troponin I (High Sensitivity): 43 ng/L — ABNORMAL HIGH (ref <18)

## 2021-08-27 LAB — PROTIME-INR
INR: 1 (ref 0.8–1.2)
Prothrombin Time: 13.1 seconds (ref 11.4–15.2)

## 2021-08-27 LAB — CBC
HCT: 51.3 % (ref 39.0–52.0)
Hemoglobin: 17.7 g/dL — ABNORMAL HIGH (ref 13.0–17.0)
MCH: 32.1 pg (ref 26.0–34.0)
MCHC: 34.5 g/dL (ref 30.0–36.0)
MCV: 93.1 fL (ref 80.0–100.0)
Platelets: 305 10*3/uL (ref 150–400)
RBC: 5.51 MIL/uL (ref 4.22–5.81)
RDW: 13.9 % (ref 11.5–15.5)
WBC: 10.8 10*3/uL — ABNORMAL HIGH (ref 4.0–10.5)
nRBC: 0 % (ref 0.0–0.2)

## 2021-08-27 LAB — TYPE AND SCREEN
ABO/RH(D): A POS
Antibody Screen: NEGATIVE

## 2021-08-27 LAB — TSH: TSH: 1.812 u[IU]/mL (ref 0.350–4.500)

## 2021-08-27 LAB — ABO/RH: ABO/RH(D): A POS

## 2021-08-27 LAB — POC OCCULT BLOOD, ED: Fecal Occult Bld: POSITIVE — AB

## 2021-08-27 LAB — LIPASE, BLOOD: Lipase: 51 U/L (ref 11–51)

## 2021-08-27 LAB — ETHANOL: Alcohol, Ethyl (B): 12 mg/dL — ABNORMAL HIGH (ref <10)

## 2021-08-27 MED ORDER — LORAZEPAM 2 MG/ML IJ SOLN
2.0000 mg | Freq: Once | INTRAMUSCULAR | Status: AC
  Administered 2021-08-27: 2 mg via INTRAVENOUS
  Filled 2021-08-27: qty 1

## 2021-08-27 MED ORDER — IOHEXOL 350 MG/ML SOLN
100.0000 mL | Freq: Once | INTRAVENOUS | Status: AC | PRN
  Administered 2021-08-27: 100 mL via INTRAVENOUS

## 2021-08-27 MED ORDER — OXYCODONE-ACETAMINOPHEN 5-325 MG PO TABS
1.0000 | ORAL_TABLET | Freq: Once | ORAL | Status: AC
  Administered 2021-08-27: 1 via ORAL
  Filled 2021-08-27: qty 1

## 2021-08-27 MED ORDER — SODIUM CHLORIDE 0.9 % IV BOLUS
1000.0000 mL | Freq: Once | INTRAVENOUS | Status: AC
  Administered 2021-08-27: 1000 mL via INTRAVENOUS

## 2021-08-27 MED ORDER — ONDANSETRON HCL 4 MG/2ML IJ SOLN
4.0000 mg | Freq: Once | INTRAMUSCULAR | Status: AC
  Administered 2021-08-27: 4 mg via INTRAVENOUS
  Filled 2021-08-27: qty 2

## 2021-08-27 MED ORDER — LABETALOL HCL 5 MG/ML IV SOLN
20.0000 mg | Freq: Once | INTRAVENOUS | Status: AC
  Administered 2021-08-27: 20 mg via INTRAVENOUS
  Filled 2021-08-27: qty 4

## 2021-08-27 NOTE — ED Notes (Signed)
Pt taken to ct 

## 2021-08-27 NOTE — ED Triage Notes (Signed)
Elevated blood pressure.

## 2021-08-27 NOTE — ED Provider Notes (Signed)
[facility] Provider Note   CSN: [id number] Arrival date & time: 08/27/21  1455     History Chief Complaint  Patient presents with   Chest Pain    Timothy [name] is a 50 y.o. male.  He is here with multiple complaints.  He has been having on and off chest pain and elevated blood pressures for a while.  2 nights ago he fell off of a trailer landing on his back.  He also fell again or had a syncopal event inside the house and struck his head.  He has had nausea and vomiting today.  He is also noticed his stools have been black for the last few days.  He smokes cigarettes and states he drinks alcohol but not as much as he used to and smokes marijuana.  He denies other drugs.  He also had a stabbing right-sided abdominal pain that is worse when he coughs.  This started yesterday.  The history is provided by the patient.  Chest Pain Pain location:  L chest Pain quality: aching   Pain radiates to:  Does not radiate Pain severity:  Moderate Onset quality:  Gradual Duration:  1 month Timing:  Intermittent Progression:  Unchanged Chronicity:  New Relieved by:  None tried Worsened by:  Nothing Ineffective treatments:  None tried Associated symptoms: abdominal pain, cough, headache, nausea, shortness of breath and vomiting   Associated symptoms: no fever   Risk factors: hypertension, male sex and smoking       Past Medical History:  Diagnosis Date   Anxiety    Arthritis    CAD (coronary artery disease) 2007   50% stenosis small diagonal   Central sleep apnea    Chronic pain    since 1998 has been on chronic pain medication   Fatty liver    GERD (gastroesophageal reflux disease)    Hemorrhoids    Hepatomegaly 10/03/2014   HTN (hypertension)    Palpitations    Syncope    Tachycardia    Tobacco abuse    Tubular adenoma    Wolff-Parkinson-White (WPW) syndrome    says was cured with ablation    Patient Active Problem List   Diagnosis Date Noted    Barrett's esophagus without dysplasia 02/09/2021   Esophagitis-etoh and NSAID related 12/20/2020   PUD (peptic ulcer disease)/Gastric Ulcers---etoh and Nsaid related 12/20/2020   Alcohol withdrawal/DTs 12/19/2020   Atypical chest pain    Chest pain 12/18/2020   Non-intractable vomiting    Dysphagia    Elevated LFTs    Hyperbilirubinemia    Chronic combined systolic (congestive) and diastolic (congestive) heart failure ([place]) 05/08/2016   Acute CHF (congestive heart failure) ([place]) 04/18/2016   ETOH abuse 04/18/2016   RBBB 04/18/2016   Cardiomyopathy ([place]) 04/18/2016   Family history of coronary artery disease in father 04/18/2016   Dyslipidemia 04/18/2016   Elevated brain natriuretic peptide (BNP) level    Acute on chronic diastolic heart failure ([facility])    Interstitial edema    Hemoptysis 04/11/2016   Orthopnea 04/11/2016   Elevated troponin I level    Stroke ([facility]) 10/28/2014   Hypertensive emergency 10/28/2014   Left sided numbness 10/28/2014   Hepatomegaly 10/03/2014   GERD (gastroesophageal reflux disease) 07/13/2013   Hemorrhoids 07/13/2013   Abdominal pain, chronic, right upper quadrant 05/03/2013   Bowel habit changes 05/03/2013   FH: colon cancer 05/03/2013   Rectal bleeding 05/03/2013   Hypokalemia 03/08/2013   History of Wolff-Parkinson-White syndrome 03/08/2013  Malignant hypertension 03/08/2013   Polycythemia 03/08/2013   Tobacco abuse 03/08/2013   Chronic anxiety 03/08/2013   Leukocytosis 03/08/2013   Laceration of arm 03/08/2013   Chest tightness 03/08/2013   CAD (coronary artery disease) 08/11/2012   Smoking 08/11/2012   NIGHT SWEATS 10/23/2010   Anxiety state 12/23/2008   Essential hypertension 12/23/2008   SYNCOPE 12/23/2008   Palpitations 12/23/2008   TACHYCARDIA, HX OF 12/23/2008    Past Surgical History:  Procedure Laterality Date   ADENOIDECTOMY     APPENDECTOMY     ATRIAL ABLATION SURGERY     AV NODE ABLATION     BIOPSY N/A 05/12/2013    Procedure: BIOPSY;  Surgeon: Daneil Dolin, MD;  Location: [facility];  Service: Endoscopy;  Laterality: N/A;   BIOPSY  12/20/2020   Procedure: BIOPSY;  Surgeon: Daneil Dolin, MD;  Location: [facility];  Service: Endoscopy;;   CARDIAC CATHETERIZATION N/A 05/08/2016   Procedure: Right/Left Heart Cath and Coronary Angiography;  Surgeon: Peter M Martinique, MD;  Location: [facility];  Service: Cardiovascular;  Laterality: N/A;   CARDIAC ELECTROPHYSIOLOGY STUDY AND ABLATION     SA node ablation   COLONOSCOPY     age 48   COLONOSCOPY WITH PROPOFOL N/A 05/12/2013   Dr. Gala Romney- hemorrhoids, tubular adenoma   ESOPHAGEAL DILATION  12/20/2020   Procedure: ESOPHAGEAL DILATION;  Surgeon: Daneil Dolin, MD;  Location: [facility];  Service: Endoscopy;;   ESOPHAGEAL DILATION N/A 12/20/2020   Procedure: ESOPHAGEAL DILATION;  Surgeon: Daneil Dolin, MD;  Location: [facility];  Service: Endoscopy;  Laterality: N/A;   ESOPHAGOGASTRODUODENOSCOPY (EGD) WITH PROPOFOL N/A 05/12/2013   Dr. Gala Romney- hiatal hernia, erosive reflux esophagitis   ESOPHAGOGASTRODUODENOSCOPY (EGD) WITH PROPOFOL N/A 12/20/2020   Procedure: ESOPHAGOGASTRODUODENOSCOPY (EGD) WITH PROPOFOL;  Surgeon: Daneil Dolin, MD;  Location: [facility];  Service: Endoscopy;  Laterality: N/A;   HAND SURGERY     HEMORRHOID BANDING  06/22/13   POLYPECTOMY N/A 05/12/2013   Procedure: POLYPECTOMY;  Surgeon: Daneil Dolin, MD;  Location: [facility];  Service: Endoscopy;  Laterality: N/A;   SHOULDER SURGERY         Family History  Problem Relation Age of Onset   Coronary artery disease Father        cabg   Colon cancer Father        age 34, chemo/surgery   Breast cancer Mother    Hypertension Mother     Social History   Tobacco Use   Smoking status: Every Day    Packs/day: 0.50    Years: 25.00    Pack years: 12.50    Types: Cigarettes   Smokeless tobacco: Never  Vaping Use   Vaping Use: Never used  Substance Use Topics   Alcohol use:  Yes    Comment: daily; 12/18/20-5 days/week, 6 drinks at time   Drug use: Yes    Types: Marijuana    Comment: hx marijuana use    Home Medications Prior to Admission medications   Medication Sig Start Date End Date Taking? Authorizing Provider  albuterol (PROAIR HFA) 108 (90 Base) MCG/ACT inhaler Inhale 2 puffs into the lungs every 6 (six) hours as needed for wheezing or shortness of breath.    [provider]  atorvastatin (LIPITOR) 10 MG tablet Take 1 tablet (10 mg total) by mouth at bedtime. 12/20/20   Roxan Hockey, MD  busPIRone (BUSPAR) 10 MG tablet Take 1 tablet (10 mg total) by  mouth 3 (three) times daily. For anxiety 12/20/20   Roxan Hockey, MD  carvedilol (COREG) 25 MG tablet Take 1 tablet (25 mg total) by mouth 2 (two) times daily with a meal. 12/20/20   Denton Brick, Courage, MD  chlordiazePOXIDE (LIBRIUM) 25 MG capsule Take 1 capsule (25 mg total) by mouth 3 (three) times daily as needed for anxiety or withdrawal. Patient not taking: Reported on 08/08/2021 02/10/21   Murlean Iba, MD  Cholecalciferol (VITAMIN D3) 25 MCG (1000 UT) CAPS Take 2 capsules by mouth daily.    [provider]  cloNIDine (CATAPRES - DOSED IN MG/24 HR) 0.3 mg/24hr patch Place 1 patch (0.3 mg total) onto the skin once a week. 12/20/20   Roxan Hockey, MD  cloNIDine (CATAPRES) 0.2 MG tablet Take 1 tablet (0.2 mg total) by mouth 2 (two) times daily as needed (BP >150/90). 12/20/20   Emokpae, Courage, MD  escitalopram (LEXAPRO) 10 MG tablet Take 10 mg by mouth daily. 07/17/21   [provider]  fluticasone (FLONASE) 50 MCG/ACT nasal spray Place into both nostrils. 12/24/20   [provider]  folic acid (FOLVITE) 1 MG tablet Take 1 tablet (1 mg total) by mouth daily. 12/21/20   Roxan Hockey, MD  furosemide (LASIX) 40 MG tablet Take 1 tablet (40 mg total) by mouth daily. 02/12/21   Johnson, Clanford L, MD  hydrOXYzine (ATARAX/VISTARIL) 25 MG tablet Take 1 tablet (25 mg total) by  mouth 3 (three) times daily as needed for anxiety or nausea. 12/20/20   Roxan Hockey, MD  losartan (COZAAR) 100 MG tablet Take 100 mg by mouth daily. 07/17/21   [provider]  losartan (COZAAR) 25 MG tablet Take 1 tablet (25 mg total) by mouth daily. Patient not taking: Reported on 08/08/2021 12/21/20   Roxan Hockey, MD  Multiple Vitamin (MULTIVITAMIN WITH MINERALS) TABS tablet Take 1 tablet by mouth daily. Patient not taking: Reported on 08/08/2021 12/21/20   Roxan Hockey, MD  nicotine (NICODERM CQ - DOSED IN MG/24 HOURS) 14 mg/24hr patch Place 1 patch (14 mg total) onto the skin daily. 12/21/20   Roxan Hockey, MD  oxyCODONE (ROXICODONE) 15 MG immediate release tablet Take 15 mg by mouth every 4 (four) hours as needed for pain. 03/12/16   [provider]  pantoprazole (PROTONIX) 40 MG tablet Take 1 tablet (40 mg total) by mouth 2 (two) times daily. 02/10/21   Johnson, Clanford L, MD  spironolactone (ALDACTONE) 25 MG tablet Take 1 tablet (25 mg total) by mouth every morning. 12/20/20 08/08/21  Roxan Hockey, MD  sucralfate (CARAFATE) 1 g tablet Take 1 tablet (1 g total) by mouth 4 (four) times daily -  with meals and at bedtime for 14 days. Patient not taking: Reported on 08/08/2021 02/10/21 02/24/21  Murlean Iba, MD  thiamine 100 MG tablet Take 1 tablet (100 mg total) by mouth daily. 12/21/20   Roxan Hockey, MD  traZODone (DESYREL) 150 MG tablet Take 1 tablet (150 mg total) by mouth at bedtime. For Sleep and Anxiety 12/20/20   Roxan Hockey, MD  dexlansoprazole (DEXILANT) 60 MG capsule Take 1 capsule (60 mg total) by mouth daily. 12/25/20 02/09/21  Daneil Dolin, MD    Allergies    Morphine  Review of Systems   Review of Systems  Constitutional:  Negative for fever.  HENT:  Negative for sore throat.   Eyes:  Negative for visual disturbance.  Respiratory:  Positive for cough and shortness of breath.   Cardiovascular:  Positive  for chest pain.   Gastrointestinal:  Positive for abdominal pain, nausea and vomiting.  Genitourinary:  Negative for dysuria.  Musculoskeletal:  Negative for neck pain.  Skin:  Positive for rash (l axilla) and wound.  Neurological:  Positive for syncope and headaches.   Physical Exam Updated Vital Signs BP (!) 185/129 (BP Location: Left Arm)    Pulse (!) 134    Temp 97.8 F (36.6 C) (Oral)    Resp 20    SpO2 99%   Physical Exam Vitals and nursing note reviewed.  Constitutional:      General: He is not in acute distress.    Appearance: He is well-developed.  HENT:     Head: Normocephalic.     Comments: Abrasions on his upper forehead Eyes:     Conjunctiva/sclera: Conjunctivae normal.  Cardiovascular:     Rate and Rhythm: Regular rhythm. Tachycardia present.     Heart sounds: No murmur heard. Pulmonary:     Effort: Pulmonary effort is normal. No respiratory distress.     Breath sounds: Normal breath sounds.  Abdominal:     Palpations: Abdomen is soft.     Tenderness: There is abdominal tenderness (right mid abdomen). There is no guarding or rebound.  Musculoskeletal:        General: No swelling. Normal range of motion.     Cervical back: Neck supple.     Right lower leg: No tenderness.     Left lower leg: No tenderness.  Skin:    General: Skin is warm and dry.     Capillary Refill: Capillary refill takes less than 2 seconds.  Neurological:     General: No focal deficit present.     Mental Status: He is alert.     Comments: Patient is awake and alert.  No facial asymmetry or focal weakness or numbness.  He does have tremor in his hands.  Psychiatric:        Mood and Affect: Mood normal.    ED Results / Procedures / Treatments   Labs (all labs ordered are listed, but only abnormal results are displayed) Labs Reviewed  BASIC METABOLIC PANEL - Abnormal; Notable for the following components:      Result Value   CO2 20 (*)    Glucose, Bld 124 (*)    Anion gap 19 (*)    All other  components within normal limits  CBC - Abnormal; Notable for the following components:   WBC 10.8 (*)    Hemoglobin 17.7 (*)    All other components within normal limits  HEPATIC FUNCTION PANEL - Abnormal; Notable for the following components:   AST 98 (*)    ALT 104 (*)    Total Bilirubin 1.3 (*)    Indirect Bilirubin 1.1 (*)    All other components within normal limits  ETHANOL - Abnormal; Notable for the following components:   Alcohol, Ethyl (B) 12 (*)    All other components within normal limits  POC OCCULT BLOOD, ED - Abnormal; Notable for the following components:   Fecal Occult Bld POSITIVE (*)    All other components within normal limits  TROPONIN I (HIGH SENSITIVITY) - Abnormal; Notable for the following components:   Troponin I (High Sensitivity) 59 (*)    All other components within normal limits  TROPONIN I (HIGH SENSITIVITY) - Abnormal; Notable for the following components:   Troponin I (High Sensitivity) 43 (*)    All other components within normal limits  LIPASE, BLOOD  PROTIME-INR  TSH  TYPE AND SCREEN  ABO/RH    EKG EKG Interpretation  Date/Time:  Tuesday August 27 2021 15:16:57 EST Ventricular Rate:  137 PR Interval:  140 QRS Duration: 124 QT Interval:  370 QTC Calculation: 558 R Axis:   6 Text Interpretation: Sinus tachycardia with Fusion complexes Right bundle branch block Minimal voltage criteria for LVH, may be normal variant ( R in aVL ) Inferior infarct , age undetermined T wave abnormality, consider lateral ischemia Abnormal ECG No significant change since prior 11/22 Confirmed by Aletta Edouard [phone]) on 08/27/2021 4:16:44 PM  Radiology CT Head Wo Contrast  Result Date: 08/27/2021 CLINICAL DATA:  Head trauma with vomiting EXAM: CT HEAD WITHOUT CONTRAST TECHNIQUE: Contiguous axial images were obtained from the base of the skull through the vertex without intravenous contrast. COMPARISON:  MRI brain 10/29/2014, CT brain 10/28/2014 FINDINGS:  Brain: No evidence of acute infarction, hemorrhage, hydrocephalus, extra-axial collection or mass lesion/mass effect. Mild atrophy. Vascular: No hyperdense vessels.  Carotid vascular calcification. Skull: Normal. Negative for fracture or focal lesion. Sinuses/Orbits: No acute finding. Other: None IMPRESSION: 1. No CT evidence for acute intracranial abnormality. 2. Mild atrophy. Electronically Signed   By: Donavan Foil M.D.   On: 08/27/2021 17:07   CT Angio Chest PE W/Cm &/Or Wo Cm  Result Date: 08/27/2021 CLINICAL DATA:  Right-sided pain with nausea vomiting EXAM: CT ANGIOGRAPHY CHEST WITH CONTRAST TECHNIQUE: Multidetector CT imaging of the chest was performed using the standard protocol during bolus administration of intravenous contrast. Multiplanar CT image reconstructions and MIPs were obtained to evaluate the vascular anatomy. CONTRAST:  169mL OMNIPAQUE IOHEXOL 350 MG/ML SOLN COMPARISON:  Chest x-ray 08/27/2021, CT chest 03/23/2009 FINDINGS: Cardiovascular: Satisfactory opacification of the pulmonary arteries to the segmental level. No evidence of pulmonary embolism. Nonaneurysmal aorta. No dissection is seen. Cardiomegaly. No pericardial effusion. Common origin of left common carotid and right brachiocephalic vessels at the arch. Mild atherosclerosis. Mediastinum/Nodes: No enlarged mediastinal, hilar, or axillary lymph nodes. Thyroid gland, trachea, and esophagus demonstrate no significant findings. Lungs/Pleura: Lungs are clear. No pleural effusion or pneumothorax. Upper Abdomen: Hepatic steatosis. Musculoskeletal: No chest wall abnormality. No acute or significant osseous findings. Review of the MIP images confirms the above findings. IMPRESSION: 1. Negative for acute pulmonary embolus or aortic dissection. 2. Clear lung fields. Aortic Atherosclerosis (ICD10-I70.0). Electronically Signed   By: Donavan Foil M.D.   On: 08/27/2021 17:12   CT Abdomen Pelvis W Contrast  Result Date:  08/27/2021 CLINICAL DATA:  Right-sided abdominal pain EXAM: CT ABDOMEN AND PELVIS WITH CONTRAST TECHNIQUE: Multidetector CT imaging of the abdomen and pelvis was performed using the standard protocol following bolus administration of intravenous contrast. CONTRAST:  135mL OMNIPAQUE IOHEXOL 350 MG/ML SOLN COMPARISON:  CT 03/07/2013, 02/09/2021 FINDINGS: Lower chest: Lung bases demonstrate no acute consolidation or effusion. Hepatobiliary: Hepatic steatosis. No calcified gallstone or biliary dilatation. Pancreas: Unremarkable. No pancreatic ductal dilatation or surrounding inflammatory changes. Spleen: Normal in size without focal abnormality. Adrenals/Urinary Tract: Adrenal glands are normal. Kidneys show no hydronephrosis. Cyst in the left kidney. The bladder is normal Stomach/Bowel: The stomach is nonenlarged. No dilated small bowel. No acute bowel wall thickening Vascular/Lymphatic: Moderate aortic atherosclerosis. No aneurysm. No suspicious nodes Reproductive: Prostate is unremarkable. Other: Negative for pelvic effusion or free air. Musculoskeletal: Edema and asymmetric enlargement of the right lateral abdominal wall musculature with hyperdense collection measuring 10.7 by 2.1 cm consistent with a hematoma. No extravasation. Lumbar alignment normal. No acute osseous abnormality. IMPRESSION: 1. No  CT evidence for acute intra-abdominal or pelvic abnormality. 2. Edema and asymmetric enlargement of right lateral abdominal wall musculature consistent with contusion and hematoma. 3. Hepatic steatosis Electronically Signed   By: Donavan Foil M.D.   On: 08/27/2021 17:19   DG Chest Port 1 View  Result Date: 08/27/2021 CLINICAL DATA:  Chest pain, recent fall EXAM: PORTABLE CHEST 1 VIEW COMPARISON:  Previous studies including the examination of 08/08/2021 FINDINGS: Transverse diameter of heart is increased. There are no signs of pulmonary edema or focal pulmonary consolidation. There is no significant pleural  effusion or pneumothorax. IMPRESSION: Cardiomegaly. There are no signs of pulmonary edema or focal pulmonary consolidation. There is no pleural effusion or pneumothorax. Electronically Signed   By: Elmer Picker M.D.   On: 08/27/2021 16:06    Procedures Procedures   Medications Ordered in ED Medications  sodium chloride 0.9 % bolus 1,000 mL (0 mLs Intravenous Stopped 08/27/21 1757)  ondansetron (ZOFRAN) injection 4 mg (4 mg Intravenous Given 08/27/21 1625)  LORazepam (ATIVAN) injection 2 mg (2 mg Intravenous Given 08/27/21 1625)  iohexol (OMNIPAQUE) 350 MG/ML injection 100 mL (100 mLs Intravenous Contrast Given 08/27/21 1701)  labetalol (NORMODYNE) injection 20 mg (20 mg Intravenous Given 08/27/21 1804)  oxyCODONE-acetaminophen (PERCOCET/ROXICET) 5-325 MG per tablet 1 tablet (1 tablet Oral Given 08/27/21 1803)    ED Course  I have reviewed the triage vital signs and the nursing notes.  Pertinent labs & imaging results that were available during my care of the patient were reviewed by me and considered in my medical decision making (see chart for details).  Clinical Course as of 08/28/21 1041  Tue Aug 27, 2021  1610 Chest x-ray showing cardiomegaly no gross infiltrates.  Awaiting radiology reading. [MB]  1610 Patient's blood pressures come down somewhat and heart rate has come down with medication.  He has a PCP with Dr. Riley Kill.  Recommended close follow-up with him regarding his blood pressure.  Counseled to stop drinking alcohol.  Heme positive stool but not on any blood thinners and hemoglobin stable.  We will also refer to GI. [MB]    Clinical Course User Index [MB] Hayden Rasmussen, MD   MDM Rules/Calculators/A&P                          Linton Ham Petrakis was evaluated in Emergency Department on 08/27/2021 for the symptoms described in the history of present illness. He was evaluated in the context of the global COVID-19 pandemic, which necessitated consideration that the  patient might be at risk for infection with the SARS-CoV-2 virus that causes COVID-19. Institutional protocols and algorithms that pertain to the evaluation of patients at risk for COVID-19 are in a state of rapid change based on information released by regulatory bodies including the CDC and federal and state organizations. These policies and algorithms were followed during the patient's care in the ED.  This patient complains of on and off chest pain, head injury, right sided abdominal pain after fall, elevated blood pressure, dark stools, nausea and vomiting; this involves an extensive number of treatment Options and is a complaint that carries with it a high risk of complications and Morbidity. The differential includes head injury, intracranial bleed, ACS, pneumonia, pneumonia thorax, intra-abdominal bleeding, GI bleed, dehydration, metabolic derangement, liver disease, hypertensive emergency  I ordered, reviewed and interpreted labs, which included CBC with normal white count normal hemoglobin, chemistries with mildly low bicarb reflecting some dehydration, AST and ALT  elevated possibly reflecting some alcoholic disease, alcohol mildly elevated, troponins elevated but flat, fecal occult positive I ordered medication IV fluids IV benzodiazepines, pain medication, labetalol for elevated blood pressure I ordered imaging studies which included chest x-ray, CT head angio chest and CT abdomen pelvis and I independently    visualized and interpreted imaging which showed signs of contusion to right abdominal wall  Previous records obtained and reviewed in epic seen by me a few weeks ago and admitted about 6 months ago with gastritis and alcohol abuse   After the interventions stated above, I reevaluated the patient and found patient remains hypertensive although otherwise asymptomatic.  No indications for admission at this time.  Recommended close follow-up with his PCP and GI doctors regarding his  symptoms.  Will need escalation of blood pressure medications likely.  Return instructions discussed  Final Clinical Impression(s) / ED Diagnoses Final diagnoses:  Nonspecific chest pain  Injury of head, initial encounter  Contusion of abdominal wall, initial encounter  Primary hypertension  Heme positive stool    Rx / DC Orders ED Discharge Orders     None        Hayden Rasmussen, MD 08/28/21 1047

## 2021-08-27 NOTE — Discharge Instructions (Addendum)
You were seen in the emergency department for evaluation of injuries to your head and abdomen along with chest pain and dark stools.  Your stools were positive for blood and this will need to follow-up with GI Dr. Gala Romney.  There is no evidence of heart attack.  Your blood pressure was very elevated here and you will need to follow-up with Dr. Riley Kill regarding this.  You probably need to be on more medication.  You also need to limit your alcohol as it is affecting your liver and may also be raising your blood pressure.

## 2021-08-27 NOTE — ED Notes (Addendum)
See triage notes. Pt has scar on top of head from fall. Pt diaphoretic. Pain to chest and right flank is worse with palpation. Patient shaky to arms/hands. States he is like that all the time. States he only drinks alcohol on the weekends. Comfort measures given.

## 2021-08-27 NOTE — ED Notes (Addendum)
Pt reports he fell night before last and hit his head. Reports +LOC. Pt also c/o RLQ abdominal pain since last night and mid chest pain since the fall. Pt also c/o n/v that started this morning. Pt actively vomiting upon arrival to ED room.

## 2021-09-10 DIAGNOSIS — G894 Chronic pain syndrome: Secondary | ICD-10-CM | POA: Diagnosis not present

## 2021-09-10 DIAGNOSIS — T148XXA Other injury of unspecified body region, initial encounter: Secondary | ICD-10-CM | POA: Diagnosis not present

## 2021-09-10 DIAGNOSIS — L732 Hidradenitis suppurativa: Secondary | ICD-10-CM | POA: Diagnosis not present

## 2021-09-24 ENCOUNTER — Emergency Department (HOSPITAL_COMMUNITY): Payer: Medicare Other

## 2021-09-24 ENCOUNTER — Emergency Department (HOSPITAL_COMMUNITY)
Admission: EM | Admit: 2021-09-24 | Discharge: 2021-09-24 | Disposition: A | Discharge status: Home / Self Care | Payer: Medicare Other | Attending: Emergency Medicine | Admitting: Emergency Medicine

## 2021-09-24 ENCOUNTER — Encounter (HOSPITAL_COMMUNITY): Payer: Self-pay | Admitting: Emergency Medicine

## 2021-09-24 ENCOUNTER — Other Ambulatory Visit: Payer: Self-pay

## 2021-09-24 DIAGNOSIS — Z20822 Contact with and (suspected) exposure to covid-19: Secondary | ICD-10-CM | POA: Diagnosis not present

## 2021-09-24 DIAGNOSIS — Z743 Need for continuous supervision: Secondary | ICD-10-CM | POA: Diagnosis not present

## 2021-09-24 DIAGNOSIS — R52 Pain, unspecified: Secondary | ICD-10-CM | POA: Diagnosis not present

## 2021-09-24 DIAGNOSIS — F17200 Nicotine dependence, unspecified, uncomplicated: Secondary | ICD-10-CM | POA: Insufficient documentation

## 2021-09-24 DIAGNOSIS — R519 Headache, unspecified: Secondary | ICD-10-CM | POA: Diagnosis not present

## 2021-09-24 DIAGNOSIS — I517 Cardiomegaly: Secondary | ICD-10-CM | POA: Diagnosis not present

## 2021-09-24 DIAGNOSIS — K760 Fatty (change of) liver, not elsewhere classified: Secondary | ICD-10-CM | POA: Insufficient documentation

## 2021-09-24 DIAGNOSIS — R42 Dizziness and giddiness: Secondary | ICD-10-CM | POA: Diagnosis not present

## 2021-09-24 DIAGNOSIS — I509 Heart failure, unspecified: Secondary | ICD-10-CM | POA: Diagnosis not present

## 2021-09-24 DIAGNOSIS — R000 Tachycardia, unspecified: Secondary | ICD-10-CM | POA: Diagnosis not present

## 2021-09-24 DIAGNOSIS — I700 Atherosclerosis of aorta: Secondary | ICD-10-CM | POA: Diagnosis not present

## 2021-09-24 DIAGNOSIS — R059 Cough, unspecified: Secondary | ICD-10-CM | POA: Diagnosis not present

## 2021-09-24 DIAGNOSIS — I110 Hypertensive heart disease with heart failure: Secondary | ICD-10-CM | POA: Insufficient documentation

## 2021-09-24 DIAGNOSIS — R109 Unspecified abdominal pain: Secondary | ICD-10-CM | POA: Diagnosis not present

## 2021-09-24 DIAGNOSIS — G4489 Other headache syndrome: Secondary | ICD-10-CM | POA: Diagnosis not present

## 2021-09-24 DIAGNOSIS — R0689 Other abnormalities of breathing: Secondary | ICD-10-CM | POA: Diagnosis not present

## 2021-09-24 LAB — CBC WITH DIFFERENTIAL/PLATELET
Abs Immature Granulocytes: 0.03 10*3/uL (ref 0.00–0.07)
Basophils Absolute: 0.1 10*3/uL (ref 0.0–0.1)
Basophils Relative: 1 %
Eosinophils Absolute: 0 10*3/uL (ref 0.0–0.5)
Eosinophils Relative: 1 %
HCT: 45 % (ref 39.0–52.0)
Hemoglobin: 15.8 g/dL (ref 13.0–17.0)
Immature Granulocytes: 0 %
Lymphocytes Relative: 11 %
Lymphs Abs: 0.8 10*3/uL (ref 0.7–4.0)
MCH: 32.6 pg (ref 26.0–34.0)
MCHC: 35.1 g/dL (ref 30.0–36.0)
MCV: 93 fL (ref 80.0–100.0)
Monocytes Absolute: 0.6 10*3/uL (ref 0.1–1.0)
Monocytes Relative: 8 %
Neutro Abs: 6.1 10*3/uL (ref 1.7–7.7)
Neutrophils Relative %: 79 %
Platelets: 234 10*3/uL (ref 150–400)
RBC: 4.84 MIL/uL (ref 4.22–5.81)
RDW: 13.2 % (ref 11.5–15.5)
WBC: 7.7 10*3/uL (ref 4.0–10.5)
nRBC: 0 % (ref 0.0–0.2)

## 2021-09-24 LAB — RESP PANEL BY RT-PCR (FLU A&B, COVID) ARPGX2
Influenza A by PCR: NEGATIVE
Influenza B by PCR: NEGATIVE
SARS Coronavirus 2 by RT PCR: NEGATIVE

## 2021-09-24 LAB — COMPREHENSIVE METABOLIC PANEL
ALT: 82 U/L — ABNORMAL HIGH (ref 0–44)
AST: 93 U/L — ABNORMAL HIGH (ref 15–41)
Albumin: 4.1 g/dL (ref 3.5–5.0)
Alkaline Phosphatase: 111 U/L (ref 38–126)
Anion gap: 14 (ref 5–15)
BUN: 13 mg/dL (ref 6–20)
CO2: 27 mmol/L (ref 22–32)
Calcium: 9.4 mg/dL (ref 8.9–10.3)
Chloride: 88 mmol/L — ABNORMAL LOW (ref 98–111)
Creatinine, Ser: 1.16 mg/dL (ref 0.61–1.24)
GFR, Estimated: >60 mL/min (ref >60)
Glucose, Bld: 106 mg/dL — ABNORMAL HIGH (ref 70–99)
Potassium: 3.2 mmol/L — ABNORMAL LOW (ref 3.5–5.1)
Sodium: 129 mmol/L — ABNORMAL LOW (ref 135–145)
Total Bilirubin: 1.1 mg/dL (ref 0.3–1.2)
Total Protein: 7.4 g/dL (ref 6.5–8.1)

## 2021-09-24 LAB — MAGNESIUM: Magnesium: 1.5 mg/dL — ABNORMAL LOW (ref 1.7–2.4)

## 2021-09-24 LAB — TROPONIN I (HIGH SENSITIVITY)
Troponin I (High Sensitivity): 46 ng/L — ABNORMAL HIGH (ref <18)
Troponin I (High Sensitivity): 49 ng/L — ABNORMAL HIGH (ref <18)

## 2021-09-24 LAB — CK: Total CK: 155 U/L (ref 49–397)

## 2021-09-24 MED ORDER — HYDROMORPHONE HCL 1 MG/ML IJ SOLN
0.5000 mg | Freq: Once | INTRAMUSCULAR | Status: DC
Start: 2021-09-24 — End: 2021-09-24
  Filled 2021-09-24: qty 1

## 2021-09-24 MED ORDER — POTASSIUM CHLORIDE 20 MEQ PO PACK
40.0000 meq | PACK | Freq: Once | ORAL | Status: AC
  Administered 2021-09-24: 40 meq via ORAL
  Filled 2021-09-24: qty 2

## 2021-09-24 MED ORDER — METOCLOPRAMIDE HCL 5 MG/ML IJ SOLN
10.0000 mg | Freq: Once | INTRAMUSCULAR | Status: AC
  Administered 2021-09-24: 10 mg via INTRAVENOUS
  Filled 2021-09-24: qty 2

## 2021-09-24 MED ORDER — HYDROMORPHONE HCL 1 MG/ML IJ SOLN
0.5000 mg | Freq: Once | INTRAMUSCULAR | Status: AC
Start: 2021-09-24 — End: 2021-09-24
  Administered 2021-09-24: 0.5 mg via INTRAVENOUS

## 2021-09-24 MED ORDER — MAGNESIUM SULFATE 2 GM/50ML IV SOLN
2.0000 g | Freq: Once | INTRAVENOUS | Status: AC
  Administered 2021-09-24: 2 g via INTRAVENOUS
  Filled 2021-09-24: qty 50

## 2021-09-24 MED ORDER — IOHEXOL 300 MG/ML  SOLN
100.0000 mL | Freq: Once | INTRAMUSCULAR | Status: AC | PRN
  Administered 2021-09-24: 100 mL via INTRAVENOUS

## 2021-09-24 MED ORDER — DIPHENHYDRAMINE HCL 50 MG/ML IJ SOLN
25.0000 mg | Freq: Once | INTRAMUSCULAR | Status: AC
  Administered 2021-09-24: 25 mg via INTRAVENOUS
  Filled 2021-09-24: qty 1

## 2021-09-24 MED ORDER — LACTATED RINGERS IV BOLUS
1000.0000 mL | Freq: Once | INTRAVENOUS | Status: AC
Start: 2021-09-24 — End: 2021-09-24
  Administered 2021-09-24: 1000 mL via INTRAVENOUS

## 2021-09-24 MED ORDER — KETOROLAC TROMETHAMINE 15 MG/ML IJ SOLN
15.0000 mg | Freq: Once | INTRAMUSCULAR | Status: AC
  Administered 2021-09-24: 15 mg via INTRAVENOUS
  Filled 2021-09-24: qty 1

## 2021-09-24 NOTE — ED Notes (Signed)
Pt back from CT

## 2021-09-24 NOTE — ED Notes (Signed)
Pt to Ct

## 2021-09-24 NOTE — ED Provider Notes (Signed)
[facility] Provider Note   CSN: [id number] Arrival date & time: 09/24/21  1623     History  Chief Complaint  Patient presents with   Hypertension    Timothy [name] is a 51 y.o. male.  HPI Patient presenting for concerns of multiple symptoms.  He has had chills, frontal headache, cough, and dizziness.  Additionally, he felt a sharp pain in his right flank following a coughing spell.  This is the area that he had a traumatic hematoma to 1 month ago.  He feels that the bruising worsened following this coughing spell.  Medical history is notable for HTN, smoking history, alcohol use, CHF, GERD, PUD.  Per chart review, home medications include Coreg, clonidine, Lexapro, Lasix, Cozaar, Protonix, Aldactone.     Home Medications Prior to Admission medications   Medication Sig Start Date End Date Taking? Authorizing Provider  albuterol (VENTOLIN HFA) 108 (90 Base) MCG/ACT inhaler Inhale 2 puffs into the lungs every 6 (six) hours as needed for wheezing or shortness of breath.   Yes [provider]  atorvastatin (LIPITOR) 10 MG tablet Take 1 tablet (10 mg total) by mouth at bedtime. 12/20/20  Yes Emokpae, Courage, MD  busPIRone (BUSPAR) 10 MG tablet Take 1 tablet (10 mg total) by mouth 3 (three) times daily. For anxiety 12/20/20  Yes Emokpae, Courage, MD  carvedilol (COREG) 25 MG tablet Take 1 tablet (25 mg total) by mouth 2 (two) times daily with a meal. 12/20/20  Yes Emokpae, Courage, MD  Cholecalciferol (VITAMIN D3) 25 MCG (1000 UT) CAPS Take 2 capsules by mouth daily.   Yes [provider]  cloNIDine (CATAPRES - DOSED IN MG/24 HR) 0.3 mg/24hr patch Place 1 patch (0.3 mg total) onto the skin once a week. 12/20/20  Yes Roxan Hockey, MD  cloNIDine (CATAPRES) 0.2 MG tablet Take 1 tablet (0.2 mg total) by mouth 2 (two) times daily as needed (BP >150/90). 12/20/20  Yes Emokpae, Courage, MD  escitalopram (LEXAPRO) 10 MG tablet Take 10 mg by mouth daily. 07/17/21   Yes [provider]  folic acid (FOLVITE) 1 MG tablet Take 1 tablet (1 mg total) by mouth daily. 12/21/20  Yes Emokpae, Courage, MD  furosemide (LASIX) 40 MG tablet Take 1 tablet (40 mg total) by mouth daily. 02/12/21  Yes Johnson, Clanford L, MD  hydrOXYzine (ATARAX/VISTARIL) 25 MG tablet Take 1 tablet (25 mg total) by mouth 3 (three) times daily as needed for anxiety or nausea. 12/20/20  Yes Emokpae, Courage, MD  losartan (COZAAR) 100 MG tablet Take 100 mg by mouth daily. 07/17/21  Yes [provider]  nystatin cream (MYCOSTATIN) Apply topically 4 (four) times daily. 09/10/21  Yes [provider]  oxyCODONE (ROXICODONE) 15 MG immediate release tablet Take 15 mg by mouth every 4 (four) hours as needed for pain. 03/12/16  Yes [provider]  pantoprazole (PROTONIX) 40 MG tablet Take 1 tablet (40 mg total) by mouth 2 (two) times daily. 02/10/21  Yes Johnson, Clanford L, MD  spironolactone (ALDACTONE) 25 MG tablet Take 1 tablet (25 mg total) by mouth every morning. 12/20/20 09/24/21 Yes Emokpae, Courage, MD  thiamine 100 MG tablet Take 1 tablet (100 mg total) by mouth daily. 12/21/20  Yes Roxan Hockey, MD  traZODone (DESYREL) 150 MG tablet Take 1 tablet (150 mg total) by mouth at bedtime. For Sleep and Anxiety 12/20/20  Yes Emokpae, Courage, MD  albuterol (PROVENTIL) (2.5 MG/3ML) 0.083% nebulizer solution Take 2.5 mg by nebulization every 4 (  four) hours as needed. 07/17/21   [provider]  chlordiazePOXIDE (LIBRIUM) 25 MG capsule Take 1 capsule (25 mg total) by mouth 3 (three) times daily as needed for anxiety or withdrawal. Patient not taking: Reported on 08/08/2021 02/10/21   Murlean Iba, MD  losartan (COZAAR) 25 MG tablet Take 1 tablet (25 mg total) by mouth daily. Patient not taking: Reported on 08/08/2021 12/21/20   Roxan Hockey, MD  Multiple Vitamin (MULTIVITAMIN WITH MINERALS) TABS tablet Take 1 tablet by mouth daily. Patient not taking: Reported on  08/08/2021 12/21/20   Roxan Hockey, MD  nicotine (NICODERM CQ - DOSED IN MG/24 HOURS) 14 mg/24hr patch Place 1 patch (14 mg total) onto the skin daily. Patient not taking: Reported on 09/24/2021 12/21/20   Roxan Hockey, MD  sucralfate (CARAFATE) 1 g tablet Take 1 tablet (1 g total) by mouth 4 (four) times daily -  with meals and at bedtime for 14 days. Patient not taking: Reported on 08/08/2021 02/10/21 02/24/21  Murlean Iba, MD  dexlansoprazole (DEXILANT) 60 MG capsule Take 1 capsule (60 mg total) by mouth daily. 12/25/20 02/09/21  Daneil Dolin, MD      Allergies    Morphine    Review of Systems   Review of Systems  Constitutional:  Positive for activity change, appetite change, chills, fatigue and fever.  HENT: Negative.    Respiratory:  Positive for cough. Negative for shortness of breath, wheezing and stridor.   Cardiovascular:  Negative for chest pain, palpitations and leg swelling.  Gastrointestinal:  Positive for nausea.  Genitourinary:  Positive for flank pain. Negative for dysuria.  Musculoskeletal:  Positive for arthralgias and myalgias. Negative for joint swelling, neck pain and neck stiffness.  Skin:  Negative for rash and wound.  Neurological:  Positive for dizziness, light-headedness and headaches. Negative for tremors, seizures, syncope, speech difficulty, weakness and numbness.  Hematological:  Does not bruise/bleed easily.  Psychiatric/Behavioral:  Negative for confusion and decreased concentration.    Physical Exam Updated Vital Signs BP (!) 138/99    Pulse 82    Temp 98.3 F (36.8 C) (Oral)    Resp 18    Ht 6' (1.829 m)    Wt 99.8 kg    SpO2 99%    BMI 29.84 kg/m  Physical Exam Vitals and nursing note reviewed.  Constitutional:      General: He is not in acute distress.    Appearance: Normal appearance. He is well-developed. He is not ill-appearing, toxic-appearing or diaphoretic.  HENT:     Head: Normocephalic and atraumatic.     Right Ear: External  ear normal.     Left Ear: External ear normal.     Nose: Nose normal.     Mouth/Throat:     Mouth: Mucous membranes are moist.     Pharynx: Oropharynx is clear.  Eyes:     General: No scleral icterus.    Extraocular Movements: Extraocular movements intact.     Conjunctiva/sclera: Conjunctivae normal.  Cardiovascular:     Rate and Rhythm: Normal rate and regular rhythm.     Heart sounds: No murmur heard. Pulmonary:     Effort: Pulmonary effort is normal. No respiratory distress.     Breath sounds: Normal breath sounds. No wheezing or rales.  Chest:     Chest wall: No tenderness.  Abdominal:     Palpations: Abdomen is soft.     Tenderness: There is abdominal tenderness (Area of right flank bruise).  Musculoskeletal:  General: No swelling or deformity. Normal range of motion.     Cervical back: Normal range of motion. No rigidity or tenderness.     Right lower leg: No edema.     Left lower leg: No edema.  Skin:    General: Skin is warm and dry.     Capillary Refill: Capillary refill takes less than 2 seconds.     Findings: Bruising present.  Neurological:     General: No focal deficit present.     Mental Status: He is alert and oriented to person, place, and time.     Cranial Nerves: No cranial nerve deficit.     Sensory: No sensory deficit.     Motor: No weakness.     Coordination: Coordination normal.     Gait: Gait normal.  Psychiatric:        Mood and Affect: Mood normal.        Behavior: Behavior normal.        Thought Content: Thought content normal.        Judgment: Judgment normal.    ED Results / Procedures / Treatments   Labs (all labs ordered are listed, but only abnormal results are displayed) Labs Reviewed  COMPREHENSIVE METABOLIC PANEL - Abnormal; Notable for the following components:      Result Value   Sodium 129 (*)    Potassium 3.2 (*)    Chloride 88 (*)    Glucose, Bld 106 (*)    AST 93 (*)    ALT 82 (*)    All other components within  normal limits  MAGNESIUM - Abnormal; Notable for the following components:   Magnesium 1.5 (*)    All other components within normal limits  TROPONIN I (HIGH SENSITIVITY) - Abnormal; Notable for the following components:   Troponin I (High Sensitivity) 46 (*)    All other components within normal limits  TROPONIN I (HIGH SENSITIVITY) - Abnormal; Notable for the following components:   Troponin I (High Sensitivity) 49 (*)    All other components within normal limits  RESP PANEL BY RT-PCR (FLU A&B, COVID) ARPGX2  CBC WITH DIFFERENTIAL/PLATELET  CK    EKG EKG Interpretation  Date/Time:  Tuesday September 24 2021 17:52:18 EST Ventricular Rate:  102 PR Interval:  164 QRS Duration: 96 QT Interval:  396 QTC Calculation: 516 R Axis:   42 Text Interpretation: Sinus tachycardia Probable left atrial enlargement Borderline repolarization abnormality Prolonged QT interval Confirmed by Godfrey Pick ([id number]) on 09/24/2021 6:07:15 PM  Radiology CT Head Wo Contrast  Result Date: 09/24/2021 CLINICAL DATA:  Headache, new or worsening, fall EXAM: CT HEAD WITHOUT CONTRAST TECHNIQUE: Contiguous axial images were obtained from the base of the skull through the vertex without intravenous contrast. RADIATION DOSE REDUCTION: This exam was performed according to the departmental dose-optimization program which includes automated exposure control, adjustment of the mA and/or kV according to patient size and/or use of iterative reconstruction technique. COMPARISON:  08/27/2021. FINDINGS: Brain: No evidence of acute infarction, hemorrhage, cerebral edema, mass, mass effect, or midline shift. No hydrocephalus or extra-axial fluid collection. Vascular: No hyperdense vessel. Skull: Normal. Negative for fracture or focal lesion. Sinuses/Orbits: Mild mucosal thickening in the left maxillary sinus. Otherwise clear. Other: The mastoid air cells are well aerated. IMPRESSION: IMPRESSION No acute intracranial process. Electronically  Signed   By: Merilyn Baba M.D.   On: 09/24/2021 18:44   CT ABDOMEN PELVIS W CONTRAST  Result Date: 09/24/2021 CLINICAL DATA:  Trauma right-sided pain with  bruising EXAM: CT ABDOMEN AND PELVIS WITH CONTRAST TECHNIQUE: Multidetector CT imaging of the abdomen and pelvis was performed using the standard protocol following bolus administration of intravenous contrast. CONTRAST:  130mL OMNIPAQUE IOHEXOL 300 MG/ML  SOLN COMPARISON:  CT 08/27/2021 FINDINGS: Lower chest: Lung bases demonstrate no acute airspace disease or pleural effusion. Coronary vascular calcification. Borderline cardiomegaly Hepatobiliary: Hepatic steatosis. Possible lobulated liver contour as may be seen with cirrhosis. No calcified gallstone or biliary dilatation Pancreas: Unremarkable. No pancreatic ductal dilatation or surrounding inflammatory changes. Spleen: Normal in size without focal abnormality. Adrenals/Urinary Tract: Adrenal glands are normal. Kidneys show no hydronephrosis. Cysts in the left kidney. Bladder unremarkable Stomach/Bowel: The stomach is nonenlarged. No dilated small bowel. No acute bowel wall thickening Vascular/Lymphatic: Moderate aortic atherosclerosis. No suspicious lymph nodes. Retroaortic left renal vein. Reproductive: Prostate is unremarkable. Other: Negative for pelvic effusion or free air. Musculoskeletal: No acute fracture is seen. Soft tissue stranding and edema within the right flank musculature with hyperdense expanded muscle consistent with hematoma and contusion. IMPRESSION: 1. No CT evidence for acute intra-abdominal or pelvic abnormality. 2. Edema and mild hyperdense enlargement of the right flank musculature consistent with hematoma and contusion. 3. Hepatic steatosis. Possible lobulated liver contour as may be seen with cirrhosis. Electronically Signed   By: Donavan Foil M.D.   On: 09/24/2021 18:51   DG Chest Portable 1 View  Result Date: 09/24/2021 CLINICAL DATA:  Cough EXAM: PORTABLE CHEST 1 VIEW  COMPARISON:  Chest radiograph dated August 27, 2021 FINDINGS: The heart is enlarged. Both lungs are clear. The visualized skeletal structures are unremarkable. IMPRESSION: Stable cardiomegaly without evidence of focal consolidation or pleural effusion. Electronically Signed   By: Keane Police D.O.   On: 09/24/2021 18:07    Procedures Procedures    Medications Ordered in ED Medications  metoCLOPramide (REGLAN) injection 10 mg (10 mg Intravenous Given 09/24/21 1806)  diphenhydrAMINE (BENADRYL) injection 25 mg (25 mg Intravenous Given 09/24/21 1806)  lactated ringers bolus 1,000 mL (0 mLs Intravenous Stopped 09/24/21 2319)  HYDROmorphone (DILAUDID) injection 0.5 mg (0.5 mg Intravenous Given 09/24/21 1802)  iohexol (OMNIPAQUE) 300 MG/ML solution 100 mL (100 mLs Intravenous Contrast Given 09/24/21 1841)  magnesium sulfate IVPB 2 g 50 mL (0 g Intravenous Stopped 09/24/21 2200)  potassium chloride (KLOR-CON) packet 40 mEq (40 mEq Oral Given 09/24/21 2100)  ketorolac (TORADOL) 15 MG/ML injection 15 mg (15 mg Intravenous Given 09/24/21 2100)    ED Course/ Medical Decision Making/ A&P                           Medical Decision Making  This patient presents to the ED for concern of headache, flank pain, bruising, cough, subjective fevers, chills, fatigue, this involves an extensive number of treatment options, and is a complaint that carries with it a high risk of complications and morbidity.  The differential diagnosis includes viral illness, polypharmacy, drug intoxication, drug withdrawal, ACS, rhabdomyolysis, electrolyte disturbance, worsening of chronic illness, anemia   Co morbidities that complicate the patient evaluation  HTN, smoking history, alcohol use, CHF, GERD, PUD   Additional history obtained:  External records from outside source obtained and reviewed including EMR   Lab Tests:  I Ordered, and personally interpreted labs.  The pertinent results include: Stable slight elevation  in troponins, consistent with prior lab work; absence of rhabdomyolysis; electrolyte abnormalities: Sodium, potassium, magnesium; mild elevation in transaminases, consistent with prior lab work; normal hemoglobin, no leukocytosis, COVID  and flu negative   Imaging Studies ordered:  I ordered imaging studies including chest x-ray, CT head, CT abdomen and pelvis I independently visualized and interpreted imaging which showed the following.  Chest x-ray: Stable cardiomegaly without any acute findings.  CT head: No acute intracranial abnormalities.  CT scan of abdomen and pelvis: Edema to right flank consistent with hematoma, no other acute findings I agree with the radiologist interpretation   Cardiac Monitoring:  The patient was maintained on a cardiac monitor.  I personally viewed and interpreted the cardiac monitored which showed an underlying rhythm of: Sinus rhythm   Medicines ordered and prescription drug management:  I ordered medication including Dilaudid for diffuse pain; Benadryl and Reglan for headache and nausea; IV fluids for dehydration; magnesium and potassium for electrolyte replacement; Toradol for continued analgesia Reevaluation of the patient after these medicines showed that the patient improved I have reviewed the patients home medicines and have made adjustments as needed  Problem List / ED Course:  Pleasant 51 year old male presenting for multiple symptoms.  Symptoms are consistent with an acute viral illness, however, COVID and flu are negative.  Patient was given multiple medications for symptomatic relief and IV fluids due to decreased p.o. intake.  Diagnostic work-up was notable for multiple electrolyte abnormalities.  These were replaced in the ED.  Patient did have improvement in his symptoms following ED interventions.  He was given reassurance that there were no acute findings on CT scans.  He did feel comfortable with discharge home.    Reevaluation:  After  the interventions noted above, I reevaluated the patient and found that they have :improved   Social Determinants of Health:  Patient does not appear to have a primary care doctor.  Given his multiple chronic comorbidities, he would benefit from a primary care doctor who can manage him over long-term to optimize quality of life.   Dispostion:  After consideration of the diagnostic results and the patients response to treatment, I feel that the patent would benefit from discharge and PCP follow-up.          Final Clinical Impression(s) / ED Diagnoses Final diagnoses:  Bad headache    Rx / DC Orders ED Discharge Orders     None         Godfrey Pick, MD 09/26/21 1643

## 2021-09-24 NOTE — ED Triage Notes (Signed)
Pt arrived via [facility] c/o hypertension, dizziness, and R lower flank pain. Said he felt  something pop in R lower flank--Significant bruising noted

## 2021-10-10 DIAGNOSIS — G894 Chronic pain syndrome: Secondary | ICD-10-CM | POA: Diagnosis not present

## 2021-10-10 DIAGNOSIS — K219 Gastro-esophageal reflux disease without esophagitis: Secondary | ICD-10-CM | POA: Diagnosis not present

## 2021-11-07 DIAGNOSIS — I10 Essential (primary) hypertension: Secondary | ICD-10-CM | POA: Diagnosis not present

## 2021-11-07 DIAGNOSIS — G894 Chronic pain syndrome: Secondary | ICD-10-CM | POA: Diagnosis not present

## 2021-11-07 DIAGNOSIS — E349 Endocrine disorder, unspecified: Secondary | ICD-10-CM | POA: Diagnosis not present

## 2021-11-07 DIAGNOSIS — L989 Disorder of the skin and subcutaneous tissue, unspecified: Secondary | ICD-10-CM | POA: Diagnosis not present

## 2021-11-07 DIAGNOSIS — U099 Post covid-19 condition, unspecified: Secondary | ICD-10-CM | POA: Diagnosis not present

## 2021-11-07 DIAGNOSIS — R058 Other specified cough: Secondary | ICD-10-CM | POA: Diagnosis not present

## 2021-12-05 DIAGNOSIS — G894 Chronic pain syndrome: Secondary | ICD-10-CM | POA: Diagnosis not present

## 2022-01-02 DIAGNOSIS — G894 Chronic pain syndrome: Secondary | ICD-10-CM | POA: Diagnosis not present

## 2022-01-02 DIAGNOSIS — I10 Essential (primary) hypertension: Secondary | ICD-10-CM | POA: Diagnosis not present

## 2022-01-29 DIAGNOSIS — I700 Atherosclerosis of aorta: Secondary | ICD-10-CM | POA: Diagnosis not present

## 2022-01-29 DIAGNOSIS — I5042 Chronic combined systolic (congestive) and diastolic (congestive) heart failure: Secondary | ICD-10-CM | POA: Diagnosis not present

## 2022-01-29 DIAGNOSIS — G894 Chronic pain syndrome: Secondary | ICD-10-CM | POA: Diagnosis not present

## 2022-01-29 DIAGNOSIS — E349 Endocrine disorder, unspecified: Secondary | ICD-10-CM | POA: Diagnosis not present

## 2022-01-29 DIAGNOSIS — I10 Essential (primary) hypertension: Secondary | ICD-10-CM | POA: Diagnosis not present

## 2022-02-26 DIAGNOSIS — E349 Endocrine disorder, unspecified: Secondary | ICD-10-CM | POA: Diagnosis not present

## 2022-02-26 DIAGNOSIS — I10 Essential (primary) hypertension: Secondary | ICD-10-CM | POA: Diagnosis not present

## 2022-02-26 DIAGNOSIS — I456 Pre-excitation syndrome: Secondary | ICD-10-CM | POA: Diagnosis not present

## 2022-03-24 DIAGNOSIS — I456 Pre-excitation syndrome: Secondary | ICD-10-CM | POA: Diagnosis not present

## 2022-03-24 DIAGNOSIS — G894 Chronic pain syndrome: Secondary | ICD-10-CM | POA: Diagnosis not present

## 2022-03-24 DIAGNOSIS — I10 Essential (primary) hypertension: Secondary | ICD-10-CM | POA: Diagnosis not present

## 2022-04-23 DIAGNOSIS — G894 Chronic pain syndrome: Secondary | ICD-10-CM | POA: Diagnosis not present

## 2022-04-23 DIAGNOSIS — I10 Essential (primary) hypertension: Secondary | ICD-10-CM | POA: Diagnosis not present

## 2022-04-23 DIAGNOSIS — I456 Pre-excitation syndrome: Secondary | ICD-10-CM | POA: Diagnosis not present

## 2022-04-23 DIAGNOSIS — E349 Endocrine disorder, unspecified: Secondary | ICD-10-CM | POA: Diagnosis not present

## 2022-05-27 DIAGNOSIS — U071 COVID-19: Secondary | ICD-10-CM | POA: Diagnosis not present

## 2022-07-08 ENCOUNTER — Other Ambulatory Visit (HOSPITAL_COMMUNITY): Payer: Self-pay | Admitting: Internal Medicine

## 2022-07-08 ENCOUNTER — Other Ambulatory Visit: Payer: Self-pay | Admitting: Cardiology

## 2022-07-08 DIAGNOSIS — Z0001 Encounter for general adult medical examination with abnormal findings: Secondary | ICD-10-CM | POA: Diagnosis not present

## 2022-07-08 DIAGNOSIS — R0600 Dyspnea, unspecified: Secondary | ICD-10-CM

## 2022-07-08 DIAGNOSIS — I10 Essential (primary) hypertension: Secondary | ICD-10-CM | POA: Diagnosis not present

## 2022-07-08 DIAGNOSIS — M79606 Pain in leg, unspecified: Secondary | ICD-10-CM | POA: Diagnosis not present

## 2022-07-08 DIAGNOSIS — K219 Gastro-esophageal reflux disease without esophagitis: Secondary | ICD-10-CM | POA: Diagnosis not present

## 2022-07-08 DIAGNOSIS — R0789 Other chest pain: Secondary | ICD-10-CM | POA: Diagnosis not present

## 2022-07-08 DIAGNOSIS — M4722 Other spondylosis with radiculopathy, cervical region: Secondary | ICD-10-CM | POA: Diagnosis not present

## 2022-07-08 DIAGNOSIS — R002 Palpitations: Secondary | ICD-10-CM | POA: Diagnosis not present

## 2022-07-08 DIAGNOSIS — N62 Hypertrophy of breast: Secondary | ICD-10-CM | POA: Diagnosis not present

## 2022-07-09 ENCOUNTER — Other Ambulatory Visit (HOSPITAL_COMMUNITY): Payer: Self-pay | Admitting: Internal Medicine

## 2022-07-09 DIAGNOSIS — M79606 Pain in leg, unspecified: Secondary | ICD-10-CM

## 2022-07-10 ENCOUNTER — Other Ambulatory Visit (HOSPITAL_COMMUNITY): Payer: Self-pay | Admitting: Internal Medicine

## 2022-07-10 DIAGNOSIS — N62 Hypertrophy of breast: Secondary | ICD-10-CM

## 2022-07-10 DIAGNOSIS — R928 Other abnormal and inconclusive findings on diagnostic imaging of breast: Secondary | ICD-10-CM

## 2022-08-01 ENCOUNTER — Encounter: Payer: Self-pay | Admitting: *Deleted

## 2022-08-19 DIAGNOSIS — G894 Chronic pain syndrome: Secondary | ICD-10-CM | POA: Diagnosis not present

## 2022-09-01 ENCOUNTER — Other Ambulatory Visit (HOSPITAL_COMMUNITY): Payer: Self-pay | Admitting: Internal Medicine

## 2022-09-01 DIAGNOSIS — R0600 Dyspnea, unspecified: Secondary | ICD-10-CM

## 2022-09-17 ENCOUNTER — Other Ambulatory Visit: Payer: Self-pay

## 2022-09-17 ENCOUNTER — Emergency Department (HOSPITAL_COMMUNITY): Payer: Medicare Other

## 2022-09-17 ENCOUNTER — Inpatient Hospital Stay (HOSPITAL_COMMUNITY)
Admission: EM | Admit: 2022-09-17 | Discharge: 2022-09-21 | DRG: 368 | Disposition: A | Discharge status: Home / Self Care | Payer: Medicare Other | Attending: Internal Medicine | Admitting: Internal Medicine

## 2022-09-17 DIAGNOSIS — Z8711 Personal history of peptic ulcer disease: Secondary | ICD-10-CM

## 2022-09-17 DIAGNOSIS — K2970 Gastritis, unspecified, without bleeding: Secondary | ICD-10-CM | POA: Diagnosis not present

## 2022-09-17 DIAGNOSIS — I5042 Chronic combined systolic (congestive) and diastolic (congestive) heart failure: Secondary | ICD-10-CM | POA: Diagnosis present

## 2022-09-17 DIAGNOSIS — K920 Hematemesis: Principal | ICD-10-CM | POA: Diagnosis present

## 2022-09-17 DIAGNOSIS — Z8679 Personal history of other diseases of the circulatory system: Secondary | ICD-10-CM

## 2022-09-17 DIAGNOSIS — F1010 Alcohol abuse, uncomplicated: Secondary | ICD-10-CM | POA: Diagnosis present

## 2022-09-17 DIAGNOSIS — E876 Hypokalemia: Secondary | ICD-10-CM | POA: Diagnosis not present

## 2022-09-17 DIAGNOSIS — I428 Other cardiomyopathies: Secondary | ICD-10-CM | POA: Diagnosis present

## 2022-09-17 DIAGNOSIS — E860 Dehydration: Secondary | ICD-10-CM | POA: Diagnosis not present

## 2022-09-17 DIAGNOSIS — E785 Hyperlipidemia, unspecified: Secondary | ICD-10-CM | POA: Diagnosis not present

## 2022-09-17 DIAGNOSIS — F17210 Nicotine dependence, cigarettes, uncomplicated: Secondary | ICD-10-CM | POA: Diagnosis present

## 2022-09-17 DIAGNOSIS — G8929 Other chronic pain: Secondary | ICD-10-CM | POA: Diagnosis present

## 2022-09-17 DIAGNOSIS — Z800 Family history of malignant neoplasm of digestive organs: Secondary | ICD-10-CM

## 2022-09-17 DIAGNOSIS — Z7982 Long term (current) use of aspirin: Secondary | ICD-10-CM | POA: Diagnosis not present

## 2022-09-17 DIAGNOSIS — R000 Tachycardia, unspecified: Secondary | ICD-10-CM | POA: Diagnosis not present

## 2022-09-17 DIAGNOSIS — I2510 Atherosclerotic heart disease of native coronary artery without angina pectoris: Secondary | ICD-10-CM | POA: Diagnosis not present

## 2022-09-17 DIAGNOSIS — K274 Chronic or unspecified peptic ulcer, site unspecified, with hemorrhage: Secondary | ICD-10-CM

## 2022-09-17 DIAGNOSIS — Z79899 Other long term (current) drug therapy: Secondary | ICD-10-CM | POA: Diagnosis not present

## 2022-09-17 DIAGNOSIS — K922 Gastrointestinal hemorrhage, unspecified: Secondary | ICD-10-CM

## 2022-09-17 DIAGNOSIS — K2101 Gastro-esophageal reflux disease with esophagitis, with bleeding: Secondary | ICD-10-CM | POA: Diagnosis not present

## 2022-09-17 DIAGNOSIS — R58 Hemorrhage, not elsewhere classified: Secondary | ICD-10-CM | POA: Diagnosis not present

## 2022-09-17 DIAGNOSIS — I110 Hypertensive heart disease with heart failure: Secondary | ICD-10-CM | POA: Diagnosis not present

## 2022-09-17 DIAGNOSIS — Z803 Family history of malignant neoplasm of breast: Secondary | ICD-10-CM | POA: Diagnosis not present

## 2022-09-17 DIAGNOSIS — K828 Other specified diseases of gallbladder: Secondary | ICD-10-CM | POA: Diagnosis not present

## 2022-09-17 DIAGNOSIS — J9811 Atelectasis: Secondary | ICD-10-CM | POA: Diagnosis not present

## 2022-09-17 DIAGNOSIS — R7989 Other specified abnormal findings of blood chemistry: Secondary | ICD-10-CM | POA: Diagnosis not present

## 2022-09-17 DIAGNOSIS — Z885 Allergy status to narcotic agent status: Secondary | ICD-10-CM | POA: Diagnosis not present

## 2022-09-17 DIAGNOSIS — N179 Acute kidney failure, unspecified: Secondary | ICD-10-CM | POA: Diagnosis not present

## 2022-09-17 DIAGNOSIS — N281 Cyst of kidney, acquired: Secondary | ICD-10-CM | POA: Diagnosis not present

## 2022-09-17 DIAGNOSIS — Z8249 Family history of ischemic heart disease and other diseases of the circulatory system: Secondary | ICD-10-CM | POA: Diagnosis not present

## 2022-09-17 DIAGNOSIS — D500 Iron deficiency anemia secondary to blood loss (chronic): Secondary | ICD-10-CM | POA: Diagnosis not present

## 2022-09-17 DIAGNOSIS — F17200 Nicotine dependence, unspecified, uncomplicated: Secondary | ICD-10-CM | POA: Diagnosis not present

## 2022-09-17 DIAGNOSIS — K2211 Ulcer of esophagus with bleeding: Secondary | ICD-10-CM | POA: Diagnosis present

## 2022-09-17 DIAGNOSIS — M6281 Muscle weakness (generalized): Secondary | ICD-10-CM | POA: Diagnosis not present

## 2022-09-17 DIAGNOSIS — K760 Fatty (change of) liver, not elsewhere classified: Secondary | ICD-10-CM | POA: Diagnosis not present

## 2022-09-17 DIAGNOSIS — Z8719 Personal history of other diseases of the digestive system: Secondary | ICD-10-CM

## 2022-09-17 DIAGNOSIS — K209 Esophagitis, unspecified without bleeding: Secondary | ICD-10-CM | POA: Diagnosis present

## 2022-09-17 DIAGNOSIS — R1111 Vomiting without nausea: Secondary | ICD-10-CM | POA: Diagnosis not present

## 2022-09-17 DIAGNOSIS — R079 Chest pain, unspecified: Secondary | ICD-10-CM | POA: Diagnosis not present

## 2022-09-17 DIAGNOSIS — J449 Chronic obstructive pulmonary disease, unspecified: Secondary | ICD-10-CM | POA: Diagnosis not present

## 2022-09-17 DIAGNOSIS — G4731 Primary central sleep apnea: Secondary | ICD-10-CM | POA: Diagnosis present

## 2022-09-17 DIAGNOSIS — I509 Heart failure, unspecified: Secondary | ICD-10-CM | POA: Diagnosis not present

## 2022-09-17 LAB — COMPREHENSIVE METABOLIC PANEL
ALT: 28 U/L (ref 0–44)
AST: 80 U/L — ABNORMAL HIGH (ref 15–41)
Albumin: 2.7 g/dL — ABNORMAL LOW (ref 3.5–5.0)
Alkaline Phosphatase: 224 U/L — ABNORMAL HIGH (ref 38–126)
Anion gap: 17 — ABNORMAL HIGH (ref 5–15)
BUN: 7 mg/dL (ref 6–20)
CO2: 24 mmol/L (ref 22–32)
Calcium: 8.6 mg/dL — ABNORMAL LOW (ref 8.9–10.3)
Chloride: 95 mmol/L — ABNORMAL LOW (ref 98–111)
Creatinine, Ser: 1.42 mg/dL — ABNORMAL HIGH (ref 0.61–1.24)
GFR, Estimated: 59 mL/min — ABNORMAL LOW (ref >60)
Glucose, Bld: 115 mg/dL — ABNORMAL HIGH (ref 70–99)
Potassium: 2.3 mmol/L — CL (ref 3.5–5.1)
Sodium: 136 mmol/L (ref 135–145)
Total Bilirubin: 1.9 mg/dL — ABNORMAL HIGH (ref 0.3–1.2)
Total Protein: 7.9 g/dL (ref 6.5–8.1)

## 2022-09-17 LAB — CBC WITH DIFFERENTIAL/PLATELET
Abs Immature Granulocytes: 0.05 10*3/uL (ref 0.00–0.07)
Basophils Absolute: 0.1 10*3/uL (ref 0.0–0.1)
Basophils Relative: 1 %
Eosinophils Absolute: 0 10*3/uL (ref 0.0–0.5)
Eosinophils Relative: 0 %
HCT: 40.2 % (ref 39.0–52.0)
Hemoglobin: 14.2 g/dL (ref 13.0–17.0)
Immature Granulocytes: 1 %
Lymphocytes Relative: 8 %
Lymphs Abs: 0.9 10*3/uL (ref 0.7–4.0)
MCH: 36.2 pg — ABNORMAL HIGH (ref 26.0–34.0)
MCHC: 35.3 g/dL (ref 30.0–36.0)
MCV: 102.6 fL — ABNORMAL HIGH (ref 80.0–100.0)
Monocytes Absolute: 0.7 10*3/uL (ref 0.1–1.0)
Monocytes Relative: 7 %
Neutro Abs: 8.7 10*3/uL — ABNORMAL HIGH (ref 1.7–7.7)
Neutrophils Relative %: 83 %
Platelets: 207 10*3/uL (ref 150–400)
RBC: 3.92 MIL/uL — ABNORMAL LOW (ref 4.22–5.81)
RDW: 15.1 % (ref 11.5–15.5)
WBC: 10.4 10*3/uL (ref 4.0–10.5)
nRBC: 0 % (ref 0.0–0.2)

## 2022-09-17 LAB — PROTIME-INR
INR: 1.2 (ref 0.8–1.2)
Prothrombin Time: 15.1 seconds (ref 11.4–15.2)

## 2022-09-17 LAB — TYPE AND SCREEN
ABO/RH(D): A POS
Antibody Screen: NEGATIVE

## 2022-09-17 LAB — BRAIN NATRIURETIC PEPTIDE: B Natriuretic Peptide: 262.2 pg/mL — ABNORMAL HIGH (ref 0.0–100.0)

## 2022-09-17 LAB — TROPONIN I (HIGH SENSITIVITY)
Troponin I (High Sensitivity): 58 ng/L — ABNORMAL HIGH (ref <18)
Troponin I (High Sensitivity): 53 ng/L — ABNORMAL HIGH (ref <18)

## 2022-09-17 LAB — LIPASE, BLOOD: Lipase: 52 U/L — ABNORMAL HIGH (ref 11–51)

## 2022-09-17 LAB — MAGNESIUM: Magnesium: 1.3 mg/dL — ABNORMAL LOW (ref 1.7–2.4)

## 2022-09-17 MED ORDER — POTASSIUM CHLORIDE 10 MEQ/100ML IV SOLN
10.0000 meq | INTRAVENOUS | Status: AC
  Administered 2022-09-18 (×5): 10 meq via INTRAVENOUS
  Filled 2022-09-17 (×2): qty 100

## 2022-09-17 MED ORDER — SODIUM CHLORIDE 0.9 % IV BOLUS
1000.0000 mL | Freq: Once | INTRAVENOUS | Status: AC
  Administered 2022-09-18: 1000 mL via INTRAVENOUS

## 2022-09-17 MED ORDER — THIAMINE HCL 100 MG/ML IJ SOLN
100.0000 mg | Freq: Every day | INTRAMUSCULAR | Status: DC
  Administered 2022-09-17: 100 mg via INTRAVENOUS
  Filled 2022-09-17: qty 2

## 2022-09-17 MED ORDER — SODIUM CHLORIDE 0.9 % IV SOLN
25.0000 mg | Freq: Four times a day (QID) | INTRAVENOUS | Status: DC | PRN
  Filled 2022-09-17: qty 1

## 2022-09-17 MED ORDER — ONDANSETRON HCL 4 MG/2ML IJ SOLN
4.0000 mg | Freq: Once | INTRAMUSCULAR | Status: AC
  Administered 2022-09-17: 4 mg via INTRAVENOUS
  Filled 2022-09-17: qty 2

## 2022-09-17 MED ORDER — LORAZEPAM 1 MG PO TABS
1.0000 mg | ORAL_TABLET | ORAL | Status: AC | PRN
  Administered 2022-09-18 (×2): 2 mg via ORAL
  Administered 2022-09-18: 1 mg via ORAL
  Administered 2022-09-19: 2 mg via ORAL
  Filled 2022-09-17: qty 1
  Filled 2022-09-17 (×4): qty 2

## 2022-09-17 MED ORDER — THIAMINE MONONITRATE 100 MG PO TABS
100.0000 mg | ORAL_TABLET | Freq: Every day | ORAL | Status: DC
  Administered 2022-09-18 – 2022-09-21 (×4): 100 mg via ORAL
  Filled 2022-09-17 (×4): qty 1

## 2022-09-17 MED ORDER — ADULT MULTIVITAMIN W/MINERALS CH
1.0000 | ORAL_TABLET | Freq: Every day | ORAL | Status: DC
  Administered 2022-09-17 – 2022-09-21 (×5): 1 via ORAL
  Filled 2022-09-17 (×5): qty 1

## 2022-09-17 MED ORDER — PANTOPRAZOLE SODIUM 40 MG IV SOLR: 40.0000 mg | Freq: Two times a day (BID) | INTRAVENOUS | Status: DC

## 2022-09-17 MED ORDER — MAGNESIUM SULFATE 2 GM/50ML IV SOLN
2.0000 g | Freq: Once | INTRAVENOUS | Status: AC
  Administered 2022-09-18: 2 g via INTRAVENOUS
  Filled 2022-09-17: qty 50

## 2022-09-17 MED ORDER — PANTOPRAZOLE SODIUM 40 MG IV SOLR: 40.0000 mg | Freq: Once | INTRAVENOUS | Status: DC

## 2022-09-17 MED ORDER — FENTANYL CITRATE PF 50 MCG/ML IJ SOSY
100.0000 ug | PREFILLED_SYRINGE | INTRAMUSCULAR | Status: AC | PRN
  Administered 2022-09-17 (×2): 100 ug via INTRAVENOUS
  Filled 2022-09-17 (×2): qty 2

## 2022-09-17 MED ORDER — PANTOPRAZOLE SODIUM 40 MG IV SOLR
80.0000 mg | Freq: Once | INTRAVENOUS | Status: AC
  Administered 2022-09-17: 80 mg via INTRAVENOUS
  Filled 2022-09-17: qty 20

## 2022-09-17 MED ORDER — FOLIC ACID 1 MG PO TABS
1.0000 mg | ORAL_TABLET | Freq: Every day | ORAL | Status: DC
  Administered 2022-09-17 – 2022-09-21 (×5): 1 mg via ORAL
  Filled 2022-09-17 (×5): qty 1

## 2022-09-17 MED ORDER — PANTOPRAZOLE INFUSION (NEW) - SIMPLE MED
8.0000 mg/h | INTRAVENOUS | Status: DC
  Administered 2022-09-18 (×2): 8 mg/h via INTRAVENOUS
  Filled 2022-09-17 (×3): qty 100

## 2022-09-17 NOTE — ED Triage Notes (Signed)
Pt bib [facility] from home c/o vomiting x3 with blood. Dry heaved x3 since 3pm. C/o 10/10 in pain mid chest with vomiting. Per ems 2000 ml of red vomit. Pt c/o nausea and dry heaving for about 3 days. IV left ac 200cc fluid given with ems 174/112 bp 126 hr 99%

## 2022-09-17 NOTE — H&P (Incomplete)
History and Physical    Timothy [name] KWI:[id number] DOB: 06-Mar-1971 DOA: 09/17/2022  PCP: Redmond School, MD  Patient coming from: home  I have personally briefly reviewed patient's old medical records in [place]  Chief Complaint:  n/v x 4 days , darks stools x 2 ,  3 episodes of bloody emesis  hours pta HPI: Timothy [name] is a 52 y.o. male with medical history significant of Barrett's esophagus, GERD, alcoholic gastritis and PUD with gastric ulcers, HTN, tobacco abuse, prior history of Wolff-Parkinson-White syndrome s/p ablation, fatty liver disease, chronic combined CHF, alcohol abuse,prior history of coronary disease (50% stenosis small diagonal) who presents to ED BIB EMS for 3 episodes of bloody emesis PTA. Patient noted he started to feel ill around 4 days ago with n/v abdominal pain. He states his symptoms progressed and he began to notice dark stools. He states day of admission he called ems due to 3 episodes of bloody emesis.  Of note patient has repeat episode of emesis in ED which was noted  to be dark per notes.  Patient noted no cough, cold symptoms or fevers but did note chills.  He notes he states asa daily and takes BC powders up to three times a week for HA. He also endorsed continued tobacco use as well as etoh use. In reference to his ETOH use he states he drinks 2 beers daily and denies any history of withdrawal symptoms with cessation. He notes currently he feels improved and less nauseated s/p treatment in ED.  ED Course:  Vitals:  Afeb, bp 167/116, hr 115, rr 18 sat 99% RA Cxr: IMPRESSION: Low lung volumes with mild right basilar atelectasis.  Wbc: 10.4, hgb 14.2, MCV 102.6 , plt 207 Na 136, K 2.3, Glu 115, cr 1.42 (1.16) Ast 80 AG 17 Alphos 224 tbili 1.9 lipase 52 Ce: 59,53 Mag 1.3 Tx protonix '80mg'$  iv and zofran '4mg'$  ,fentanyl, thiamine, folic acid , mvi  Review of Systems: As per HPI otherwise 10 point review of systems negative.   Past Medical  History:  Diagnosis Date   Anxiety    Arthritis    CAD (coronary artery disease) 2007   50% stenosis small diagonal   Central sleep apnea    Chronic pain    since 1998 has been on chronic pain medication   Fatty liver    GERD (gastroesophageal reflux disease)    Hemorrhoids    Hepatomegaly 10/03/2014   HTN (hypertension)    Palpitations    Syncope    Tachycardia    Tobacco abuse    Tubular adenoma    Wolff-Parkinson-White (WPW) syndrome    says was cured with ablation    Past Surgical History:  Procedure Laterality Date   ADENOIDECTOMY     APPENDECTOMY     ATRIAL ABLATION SURGERY     AV NODE ABLATION     BIOPSY N/A 05/12/2013   Procedure: BIOPSY;  Surgeon: Daneil Dolin, MD;  Location: [facility];  Service: Endoscopy;  Laterality: N/A;   BIOPSY  12/20/2020   Procedure: BIOPSY;  Surgeon: Daneil Dolin, MD;  Location: [facility];  Service: Endoscopy;;   CARDIAC CATHETERIZATION N/A 05/08/2016   Procedure: Right/Left Heart Cath and Coronary Angiography;  Surgeon: Peter M Martinique, MD;  Location: [facility];  Service: Cardiovascular;  Laterality: N/A;   CARDIAC ELECTROPHYSIOLOGY STUDY AND ABLATION     SA node ablation   COLONOSCOPY     age 14  COLONOSCOPY WITH PROPOFOL N/A 05/12/2013   Dr. Gala Romney- hemorrhoids, tubular adenoma   ESOPHAGEAL DILATION  12/20/2020   Procedure: ESOPHAGEAL DILATION;  Surgeon: Daneil Dolin, MD;  Location: [facility];  Service: Endoscopy;;   ESOPHAGEAL DILATION N/A 12/20/2020   Procedure: ESOPHAGEAL DILATION;  Surgeon: Daneil Dolin, MD;  Location: [facility];  Service: Endoscopy;  Laterality: N/A;   ESOPHAGOGASTRODUODENOSCOPY (EGD) WITH PROPOFOL N/A 05/12/2013   Dr. Gala Romney- hiatal hernia, erosive reflux esophagitis   ESOPHAGOGASTRODUODENOSCOPY (EGD) WITH PROPOFOL N/A 12/20/2020   Procedure: ESOPHAGOGASTRODUODENOSCOPY (EGD) WITH PROPOFOL;  Surgeon: Daneil Dolin, MD;  Location: [facility];  Service: Endoscopy;  Laterality: N/A;   HAND  SURGERY     HEMORRHOID BANDING  06/22/13   POLYPECTOMY N/A 05/12/2013   Procedure: POLYPECTOMY;  Surgeon: Daneil Dolin, MD;  Location: [facility];  Service: Endoscopy;  Laterality: N/A;   SHOULDER SURGERY       reports that he has been smoking cigarettes. He has a 12.50 pack-year smoking history. He has never used smokeless tobacco. He reports current alcohol use. He reports current drug use. Drug: Marijuana.  Allergies  Allergen Reactions   Morphine Other (See Comments)    Makes overly sleepy    Family History  Problem Relation Age of Onset   Coronary artery disease Father        cabg   Colon cancer Father        age 44, chemo/surgery   Breast cancer Mother    Hypertension Mother     Prior to Admission medications   Medication Sig Start Date End Date Taking? Authorizing Provider  albuterol (PROVENTIL) (2.5 MG/3ML) 0.083% nebulizer solution Take 2.5 mg by nebulization every 4 (four) hours as needed. 07/17/21  Yes [provider]  albuterol (VENTOLIN HFA) 108 (90 Base) MCG/ACT inhaler Inhale 2 puffs into the lungs every 6 (six) hours as needed for wheezing or shortness of breath.   Yes [provider]  carvedilol (COREG) 25 MG tablet Take 1 tablet (25 mg total) by mouth 2 (two) times daily with a meal. 12/20/20  Yes Emokpae, Courage, MD  Cholecalciferol (VITAMIN D3) 25 MCG (1000 UT) CAPS Take 2 capsules by mouth every evening.   Yes [provider]  cloNIDine (CATAPRES - DOSED IN MG/24 HR) 0.3 mg/24hr patch Place 1 patch (0.3 mg total) onto the skin once a week. Patient taking differently: Place 0.3 mg onto the skin once a week. Sunday - placed on back 12/20/20  Yes Emokpae, Courage, MD  cloNIDine (CATAPRES) 0.2 MG tablet Take 1 tablet (0.2 mg total) by mouth 2 (two) times daily as needed (BP >150/90). 12/20/20  Yes Emokpae, Courage, MD  famotidine (PEPCID) 20 MG tablet Take 20 mg by mouth daily.   Yes [provider]  folic acid (FOLVITE) 1 MG tablet Take 1  tablet (1 mg total) by mouth daily. 12/21/20  Yes Emokpae, Courage, MD  furosemide (LASIX) 40 MG tablet Take 1 tablet (40 mg total) by mouth daily. Patient taking differently: Take 40 mg by mouth 2 (two) times daily. 02/12/21  Yes Johnson, Clanford L, MD  hydrOXYzine (ATARAX/VISTARIL) 25 MG tablet Take 1 tablet (25 mg total) by mouth 3 (three) times daily as needed for anxiety or nausea. 12/20/20  Yes Roxan Hockey, MD  Multiple Vitamin (MULTIVITAMIN WITH MINERALS) TABS tablet Take 1 tablet by mouth daily. 12/21/20  Yes Emokpae, Courage, MD  oxyCODONE (ROXICODONE) 15 MG immediate release tablet Take 15 mg by mouth every  6 (six) hours as needed for pain. 03/12/16  Yes [provider]  spironolactone (ALDACTONE) 25 MG tablet Take 1 tablet (25 mg total) by mouth every morning. Patient taking differently: Take 25 mg by mouth daily as needed (for swelling of feet). 12/20/20 09/18/23 Yes Emokpae, Courage, MD  sucralfate (CARAFATE) 1 GM/10ML suspension Take 1 g by mouth 2 (two) times daily. 06/23/22  Yes [provider]  losartan (COZAAR) 25 MG tablet Take 1 tablet (25 mg total) by mouth daily. 12/21/20   Roxan Hockey, MD  nystatin cream (MYCOSTATIN) Apply 1 Application topically daily as needed for dry skin. 09/10/21   [provider]  dexlansoprazole (DEXILANT) 60 MG capsule Take 1 capsule (60 mg total) by mouth daily. 12/25/20 02/09/21  Daneil Dolin, MD    Physical Exam: Vitals:   09/17/22 1932 09/17/22 2115 09/17/22 2129 09/17/22 2130  BP: (!) 167/116 (!) 160/126  (!) 182/123  Pulse: (!) 115 (!) 124  (!) 118  Resp: 18 (!) 30  14  SpO2: 99% 99%  97%  Weight:   104.3 kg   Height:   6' (1.829 m)     Constitutional: NAD, calm, comfortable Vitals:   09/17/22 1932 09/17/22 2115 09/17/22 2129 09/17/22 2130  BP: (!) 167/116 (!) 160/126  (!) 182/123  Pulse: (!) 115 (!) 124  (!) 118  Resp: 18 (!) 30  14  SpO2: 99% 99%  97%  Weight:   104.3 kg   Height:   6' (1.829 m)     Eyes: PERRL, lids and conjunctivae normal ENMT: Mucous membranes are moist. Posterior pharynx clear of any exudate or lesions.Normal dentition.  Neck: normal, supple, no masses, no thyromegaly Respiratory: clear to auscultation bilaterally, no wheezing, no crackles. Normal respiratory effort. No accessory muscle use.  Cardiovascular: Regular rate and rhythm, no murmurs / rubs / gallops. No extremity edema. 2+ pedal pulses. No carotid bruits.  Abdomen: epigastric mid abdomen tenderness, no masses palpated. No hepatosplenomegaly. Bowel sounds positive.  Musculoskeletal: no clubbing / cyanosis. No joint deformity upper and lower extremities. Good ROM, no contractures. Normal muscle tone.  Skin: no rashes, lesions, ulcers. No induration Neurologic: CN 2-12 grossly intact. Sensation intact, Strength 5/5 in all 4.  Psychiatric: Normal judgment and insight. Alert and oriented x 3. Normal mood.    Labs on Admission: I have personally reviewed following labs and imaging studies  CBC: Recent Labs  Lab 09/17/22 2018  WBC 10.4  NEUTROABS 8.7*  HGB 14.2  HCT 40.2  MCV 102.6*  PLT 027   Basic Metabolic Panel: Recent Labs  Lab 09/17/22 2018 09/17/22 2127  NA 136  --   K 2.3*  --   CL 95*  --   CO2 24  --   GLUCOSE 115*  --   BUN 7  --   CREATININE 1.42*  --   CALCIUM 8.6*  --   MG  --  1.3*   GFR: Estimated Creatinine Clearance: 75.1 mL/min (A) (by C-G formula based on SCr of 1.42 mg/dL (H)). Liver Function Tests: Recent Labs  Lab 09/17/22 2018  AST 80*  ALT 28  ALKPHOS 224*  BILITOT 1.9*  PROT 7.9  ALBUMIN 2.7*   Recent Labs  Lab 09/17/22 2018  LIPASE 52*   No results for input(s): "AMMONIA" in the last 168 hours. Coagulation Profile: Recent Labs  Lab 09/17/22 2018  INR 1.2   Cardiac Enzymes: No results for input(s): "CKTOTAL", "CKMB", "CKMBINDEX", "TROPONINI" in the last 168 hours.  BNP (last 3 results) No results for input(s): "PROBNP" in the last 8760  hours. HbA1C: No results for input(s): "HGBA1C" in the last 72 hours. CBG: No results for input(s): "GLUCAP" in the last 168 hours. Lipid Profile: No results for input(s): "CHOL", "HDL", "LDLCALC", "TRIG", "CHOLHDL", "LDLDIRECT" in the last 72 hours. Thyroid Function Tests: No results for input(s): "TSH", "T4TOTAL", "FREET4", "T3FREE", "THYROIDAB" in the last 72 hours. Anemia Panel: No results for input(s): "VITAMINB12", "FOLATE", "FERRITIN", "TIBC", "IRON", "RETICCTPCT" in the last 72 hours. Urine analysis:    Component Value Date/Time   COLORURINE YELLOW 04/11/2016 1044   APPEARANCEUR CLEAR 04/11/2016 1044   LABSPEC 1.011 04/11/2016 1044   PHURINE 7.5 04/11/2016 1044   GLUCOSEU NEGATIVE 04/11/2016 1044   HGBUR NEGATIVE 04/11/2016 1044   BILIRUBINUR NEGATIVE 04/11/2016 1044   KETONESUR NEGATIVE 04/11/2016 1044   PROTEINUR NEGATIVE 04/11/2016 1044   UROBILINOGEN 0.2 10/29/2014 0937   NITRITE NEGATIVE 04/11/2016 1044   LEUKOCYTESUR NEGATIVE 04/11/2016 1044    Radiological Exams on Admission: DG Chest Port 1 View  Result Date: 09/17/2022 CLINICAL DATA:  Chest pain. EXAM: PORTABLE CHEST 1 VIEW COMPARISON:  September 24, 2021 FINDINGS: The cardiac silhouette is mildly enlarged. Low lung volumes are noted with mild atelectasis seen within the right lung base. There is no evidence of a pleural effusion or pneumothorax. The visualized skeletal structures are unremarkable. IMPRESSION: Low lung volumes with mild right basilar atelectasis. Electronically Signed   By: Virgina Norfolk M.D.   On: 09/17/2022 20:16    EKG: Independently reviewed. Sinus tachycardia  Assessment/Plan   Upper Gastrointestinal Bleed -Hx of GERD/PUD -in setting of etoh abuse, prior hx of PUD, and use of BC powders  - admit to progressive care  -npo  -protonix drip -gently ivfs -hold all anti-platelet medications -current h/h stable  -continue to cycle h/h transfuse if less than 7 -f/u with GI consult DR  Kathrynn Humble.    CHF pef -resume carvedilol, lasix, spironolactone as able  Elevated Lfts  -?related to NALFD/ as well as etoh use  -continue to monitor   Chronic pain  -holding orals for now  - iv fentanyl prn  HTN, uncontrolled  -treat pain  - resume home regimen  -prn   prior history of Wolff-Parkinson-White syndrome  -s/p ablation  Chronic combined CH -compensated   CAD -no active issues   DVT prophylaxis: scd Code Status: full/ as discussed per patient wishes in event of cardiac arrest ) Family Communication: none at bedside Disposition Plan: patient  expected to be admitted greater than 2 midnights  Consults called: [place] Admission status: progressive   Clance Boll MD [facility]  If 7PM-7AM, please contact night-coverage [email or web address] Password [facility]  09/17/2022, 11:04 PM

## 2022-09-17 NOTE — ED Provider Notes (Signed)
[facility] EMERGENCY DEPARTMENT Provider Note   CSN: [id number] Arrival date & time: 09/17/22  1931     History  No chief complaint on file.   Timothy [name] is a 52 y.o. male.  Patient with history of gastric ulcers noted on endoscopy April 2022, daily 81 mg aspirin use, chronic alcohol use, combined heart failure, WPW s/p ablation--presents emergency department today for nausea, vomiting, and hematemesis.  Patient started having nausea about 4 days ago.  He has had some associated vomiting, but today he started vomiting blood with clots.  He also reports mid-chest pain with radiation to the shoulder today.  Patient had upper endoscopy during hospitalization in April 2022.  At that time he was found to have distal esophageal and gastric ulcerations.  No mention of varices.  There was question of short segment Barrett's esophagus.  He has a history of chronic pain and chronic oxycodone use.  States that he last took a tablet of this medication this morning, but thinks that he vomited about 15 minutes later.  He also drinks 2 beers a day per his report.  Today he was able to drink about half of a beer.  EMS gave 200 cc normal saline.  Patient showed me a picture on his phone of the vomit which appeared to be bloody with clots.  Reports dark stools over the past 2 days.       Home Medications Prior to Admission medications   Medication Sig Start Date End Date Taking? Authorizing Provider  albuterol (PROVENTIL) (2.5 MG/3ML) 0.083% nebulizer solution Take 2.5 mg by nebulization every 4 (four) hours as needed. 07/17/21   [provider]  albuterol (VENTOLIN HFA) 108 (90 Base) MCG/ACT inhaler Inhale 2 puffs into the lungs every 6 (six) hours as needed for wheezing or shortness of breath.    [provider]  atorvastatin (LIPITOR) 10 MG tablet Take 1 tablet (10 mg total) by mouth at bedtime. 12/20/20   Roxan Hockey, MD  busPIRone (BUSPAR) 10 MG tablet Take  1 tablet (10 mg total) by mouth 3 (three) times daily. For anxiety 12/20/20   Roxan Hockey, MD  carvedilol (COREG) 25 MG tablet Take 1 tablet (25 mg total) by mouth 2 (two) times daily with a meal. 12/20/20   Denton Brick, Courage, MD  chlordiazePOXIDE (LIBRIUM) 25 MG capsule Take 1 capsule (25 mg total) by mouth 3 (three) times daily as needed for anxiety or withdrawal. Patient not taking: Reported on 08/08/2021 02/10/21   Murlean Iba, MD  Cholecalciferol (VITAMIN D3) 25 MCG (1000 UT) CAPS Take 2 capsules by mouth daily.    [provider]  cloNIDine (CATAPRES - DOSED IN MG/24 HR) 0.3 mg/24hr patch Place 1 patch (0.3 mg total) onto the skin once a week. 12/20/20   Roxan Hockey, MD  cloNIDine (CATAPRES) 0.2 MG tablet Take 1 tablet (0.2 mg total) by mouth 2 (two) times daily as needed (BP >150/90). 12/20/20   Emokpae, Courage, MD  escitalopram (LEXAPRO) 10 MG tablet Take 10 mg by mouth daily. 07/17/21   [provider]  folic acid (FOLVITE) 1 MG tablet Take 1 tablet (1 mg total) by mouth daily. 12/21/20   Roxan Hockey, MD  furosemide (LASIX) 40 MG tablet Take 1 tablet (40 mg total) by mouth daily. 02/12/21   Johnson, Clanford L, MD  hydrOXYzine (ATARAX/VISTARIL) 25 MG tablet Take 1 tablet (25 mg total) by mouth 3 (three) times daily as needed for anxiety or nausea. 12/20/20  Roxan Hockey, MD  losartan (COZAAR) 100 MG tablet Take 100 mg by mouth daily. 07/17/21   [provider]  losartan (COZAAR) 25 MG tablet Take 1 tablet (25 mg total) by mouth daily. Patient not taking: Reported on 08/08/2021 12/21/20   Roxan Hockey, MD  Multiple Vitamin (MULTIVITAMIN WITH MINERALS) TABS tablet Take 1 tablet by mouth daily. Patient not taking: Reported on 08/08/2021 12/21/20   Roxan Hockey, MD  nicotine (NICODERM CQ - DOSED IN MG/24 HOURS) 14 mg/24hr patch Place 1 patch (14 mg total) onto the skin daily. Patient not taking: Reported on 09/24/2021 12/21/20   Roxan Hockey, MD   nystatin cream (MYCOSTATIN) Apply topically 4 (four) times daily. 09/10/21   [provider]  oxyCODONE (ROXICODONE) 15 MG immediate release tablet Take 15 mg by mouth every 4 (four) hours as needed for pain. 03/12/16   [provider]  pantoprazole (PROTONIX) 40 MG tablet Take 1 tablet (40 mg total) by mouth 2 (two) times daily. 02/10/21   Johnson, Clanford L, MD  spironolactone (ALDACTONE) 25 MG tablet Take 1 tablet (25 mg total) by mouth every morning. 12/20/20 09/24/21  Roxan Hockey, MD  sucralfate (CARAFATE) 1 g tablet Take 1 tablet (1 g total) by mouth 4 (four) times daily -  with meals and at bedtime for 14 days. Patient not taking: Reported on 08/08/2021 02/10/21 02/24/21  Murlean Iba, MD  thiamine 100 MG tablet Take 1 tablet (100 mg total) by mouth daily. 12/21/20   Roxan Hockey, MD  traZODone (DESYREL) 150 MG tablet Take 1 tablet (150 mg total) by mouth at bedtime. For Sleep and Anxiety 12/20/20   Roxan Hockey, MD  dexlansoprazole (DEXILANT) 60 MG capsule Take 1 capsule (60 mg total) by mouth daily. 12/25/20 02/09/21  Daneil Dolin, MD      Allergies    Morphine    Review of Systems   Review of Systems  Physical Exam Updated Vital Signs BP (!) 167/116 (BP Location: Right Arm)   Pulse (!) 115   Resp 18   SpO2 99%   Physical Exam Vitals and nursing note reviewed.  Constitutional:      General: He is not in acute distress.    Appearance: He is well-developed.  HENT:     Head: Normocephalic and atraumatic.     Nose: Nose normal.     Mouth/Throat:     Mouth: Mucous membranes are moist.     Pharynx: No oropharyngeal exudate or posterior oropharyngeal erythema.  Eyes:     General:        Right eye: No discharge.        Left eye: No discharge.     Conjunctiva/sclera: Conjunctivae normal.  Cardiovascular:     Rate and Rhythm: Regular rhythm. Tachycardia present.     Heart sounds: Normal heart sounds.  Pulmonary:     Effort: Pulmonary effort is  normal.     Breath sounds: Normal breath sounds.  Abdominal:     Palpations: Abdomen is soft.     Tenderness: There is abdominal tenderness. There is no guarding or rebound.  Musculoskeletal:     Cervical back: Normal range of motion and neck supple.  Skin:    General: Skin is warm and dry.  Neurological:     Mental Status: He is alert.     ED Results / Procedures / Treatments   Labs (all labs ordered are listed, but only abnormal results are displayed) Labs Reviewed  COMPREHENSIVE METABOLIC PANEL -  Abnormal; Notable for the following components:      Result Value   Potassium 2.3 (*)    Chloride 95 (*)    Glucose, Bld 115 (*)    Creatinine, Ser 1.42 (*)    Calcium 8.6 (*)    Albumin 2.7 (*)    AST 80 (*)    Alkaline Phosphatase 224 (*)    Total Bilirubin 1.9 (*)    GFR, Estimated 59 (*)    Anion gap 17 (*)    All other components within normal limits  CBC WITH DIFFERENTIAL/PLATELET - Abnormal; Notable for the following components:   RBC 3.92 (*)    MCV 102.6 (*)    MCH 36.2 (*)    Neutro Abs 8.7 (*)    All other components within normal limits  LIPASE, BLOOD - Abnormal; Notable for the following components:   Lipase 52 (*)    All other components within normal limits  BRAIN NATRIURETIC PEPTIDE - Abnormal; Notable for the following components:   B Natriuretic Peptide 262.2 (*)    All other components within normal limits  MAGNESIUM - Abnormal; Notable for the following components:   Magnesium 1.3 (*)    All other components within normal limits  TROPONIN I (HIGH SENSITIVITY) - Abnormal; Notable for the following components:   Troponin I (High Sensitivity) 58 (*)    All other components within normal limits  TROPONIN I (HIGH SENSITIVITY) - Abnormal; Notable for the following components:   Troponin I (High Sensitivity) 53 (*)    All other components within normal limits  PROTIME-INR  TYPE AND SCREEN    ED ECG REPORT   Date: 09/17/2022  Rate: 121  Rhythm:  sinus tachycardia  QRS Axis: normal  Intervals: PR prolonged  ST/T Wave abnormalities: nonspecific T wave changes  Conduction Disutrbances:first-degree A-V block   Narrative Interpretation:   Old EKG Reviewed: unchanged  I have personally reviewed the EKG tracing and agree with the computerized printout as noted.   Radiology DG Chest Port 1 View  Result Date: 09/17/2022 CLINICAL DATA:  Chest pain. EXAM: PORTABLE CHEST 1 VIEW COMPARISON:  September 24, 2021 FINDINGS: The cardiac silhouette is mildly enlarged. Low lung volumes are noted with mild atelectasis seen within the right lung base. There is no evidence of a pleural effusion or pneumothorax. The visualized skeletal structures are unremarkable. IMPRESSION: Low lung volumes with mild right basilar atelectasis. Electronically Signed   By: Virgina Norfolk M.D.   On: 09/17/2022 20:16    Procedures Procedures    Medications Ordered in ED Medications  fentaNYL (SUBLIMAZE) injection 100 mcg (100 mcg Intravenous Given 09/17/22 2123)  promethazine (PHENERGAN) 25 mg in sodium chloride 0.9 % 50 mL IVPB (has no administration in time range)  LORazepam (ATIVAN) tablet 1-4 mg (has no administration in time range)  thiamine (VITAMIN B1) tablet 100 mg (has no administration in time range)    Or  thiamine (VITAMIN B1) injection 100 mg (has no administration in time range)  folic acid (FOLVITE) tablet 1 mg (has no administration in time range)  multivitamin with minerals tablet 1 tablet (has no administration in time range)  potassium chloride 10 mEq in 100 mL IVPB (has no administration in time range)  sodium chloride 0.9 % bolus 1,000 mL (has no administration in time range)  magnesium sulfate IVPB 2 g 50 mL (has no administration in time range)  ondansetron (ZOFRAN) injection 4 mg (4 mg Intravenous Given 09/17/22 1959)  pantoprazole (PROTONIX) injection 80 mg (  80 mg Intravenous Given 09/17/22 1953)    ED Course/ Medical Decision Making/ A&P     Patient seen and examined. History obtained directly from patient.   Labs/EKG: Ordered CBC, CMP, lipase, troponin, PT/INR.  EKG reviewed.  Imaging: None ordered.   Medications/Fluids: Ordered: Fluid bolus, Protonix.  Most recent vital signs reviewed and are as follows: BP (!) 167/116 (BP Location: Right Arm)   Pulse (!) 115   Resp 18   SpO2 99%   Initial impression: Hematemesis, possible gastritis versus esophagitis given chest pain, but will need to rule out ACS and congestive heart failure.  Patient has been discussed with and seen by Dr. Kathrynn Humble.  9:54 PM Reassessment performed. Patient appears stable.  He is asking for ice chips.  Labs personally reviewed and interpreted including: CBC with differential shows hemoglobin of 14.2, WBC 10.4 otherwise unremarkable; CMP with critical low potassium at 2.3, mildly elevated creatinine at 1.42, AST 80 with alk phos 224, BUN is normal; first troponin elevated at 58 which is in line with results at previous visits and will need to be trended, second currently pending.  I added on magnesium.  Imaging personally visualized and interpreted including: Chest x-ray, agree no signs of fluid overload or pneumonia.  Reviewed pertinent lab work and imaging with patient at bedside. Questions answered.   Most current vital signs reviewed and are as follows: BP (!) 160/126   Pulse (!) 124   Resp (!) 30   Ht 6' (1.829 m)   Wt 104.3 kg   SpO2 99%   BMI 31.19 kg/m   Plan: Patient will need admitted to the hospital.  Additional antiemetics required.  Current problems hematemesis, normal hemoglobin which will need to be monitored.  History of gastric ulcers and esophagitis, however no history of varices.   Type and screen ordered.  Protonix given.    Hypokalemia likely due to alcohol use, diuretic use, vomiting and poor oral intake.  Repletion started IV as patient has been vomiting.  Magnesium pending.  Congestive heart failure, BNP pending.   Does not appear fluid overloaded at this time clinically or by x-ray.   Ongoing alcohol use: CIWA ordered.    10:32 PM Consulted with teaching service in regards to admission.   I have also spoken with Dr. Silverio Decamp of [facility].  Will plan to consult and she is aware of patient in ED.   10:47 PM troponin 58 >> 53.   Magnesium also slightly low at 1.3, ordered IV repletion of this as well.  BNP 262, noted.    CRITICAL CARE Performed by: Carlisle Cater PA-C Total critical care time: 40 minutes Critical care time was exclusive of separately billable procedures and treating other patients. Critical care was necessary to treat or prevent imminent or life-threatening deterioration. Critical care was time spent personally by me on the following activities: development of treatment plan with patient and/or surrogate as well as nursing, discussions with consultants, evaluation of patient's response to treatment, examination of patient, obtaining history from patient or surrogate, ordering and performing treatments and interventions, ordering and review of laboratory studies, ordering and review of radiographic studies, pulse oximetry and re-evaluation of patient's condition.                           Medical Decision Making Amount and/or Complexity of Data Reviewed Labs: ordered. Radiology: ordered.  Risk OTC drugs. Prescription drug management.   For this patient's complaint of chest  pain, the following emergent conditions were considered on the differential diagnosis: acute coronary syndrome, pulmonary embolism, pneumothorax, myocarditis, pericardial tamponade, aortic dissection, thoracic aortic aneurysm complication, esophageal perforation.   Other causes were also considered including: gastroesophageal reflux disease, musculoskeletal pain including costochondritis, pneumonia/pleurisy, herpes zoster, pericarditis.  Considered various causes of upper GI bleeding such as esophageal  varices, esophagitis, gastritis, peptic ulcer disease, AV malformation, neoplasm.         Final Clinical Impression(s) / ED Diagnoses Final diagnoses:  Hematemesis with nausea  Hypokalemia  Upper GI bleed  Hypomagnesemia    Rx / DC Orders ED Discharge Orders     None         Carlisle Cater, PA-C 09/17/22 2249    Varney Biles, MD 09/18/22 2333

## 2022-09-17 NOTE — ED Notes (Signed)
Pt c/o pain in abdomen, chest, and left arm PA Geiple aware.

## 2022-09-18 ENCOUNTER — Telehealth: Payer: Self-pay

## 2022-09-18 ENCOUNTER — Inpatient Hospital Stay (HOSPITAL_COMMUNITY): Payer: Medicare Other

## 2022-09-18 ENCOUNTER — Encounter (HOSPITAL_COMMUNITY)
Admission: EM | Disposition: A | Discharge status: Home / Self Care | Payer: Self-pay | Source: Home / Self Care | Attending: Internal Medicine

## 2022-09-18 ENCOUNTER — Encounter (HOSPITAL_COMMUNITY): Payer: Self-pay | Admitting: Internal Medicine

## 2022-09-18 ENCOUNTER — Inpatient Hospital Stay (HOSPITAL_COMMUNITY): Payer: Medicare Other | Admitting: Anesthesiology

## 2022-09-18 DIAGNOSIS — K2970 Gastritis, unspecified, without bleeding: Secondary | ICD-10-CM

## 2022-09-18 DIAGNOSIS — I2510 Atherosclerotic heart disease of native coronary artery without angina pectoris: Secondary | ICD-10-CM

## 2022-09-18 DIAGNOSIS — K2101 Gastro-esophageal reflux disease with esophagitis, with bleeding: Secondary | ICD-10-CM

## 2022-09-18 DIAGNOSIS — K922 Gastrointestinal hemorrhage, unspecified: Secondary | ICD-10-CM

## 2022-09-18 DIAGNOSIS — F17210 Nicotine dependence, cigarettes, uncomplicated: Secondary | ICD-10-CM

## 2022-09-18 DIAGNOSIS — K209 Esophagitis, unspecified without bleeding: Secondary | ICD-10-CM

## 2022-09-18 DIAGNOSIS — K920 Hematemesis: Secondary | ICD-10-CM | POA: Diagnosis not present

## 2022-09-18 DIAGNOSIS — I110 Hypertensive heart disease with heart failure: Secondary | ICD-10-CM

## 2022-09-18 DIAGNOSIS — I509 Heart failure, unspecified: Secondary | ICD-10-CM

## 2022-09-18 HISTORY — PX: BIOPSY: SHX5522

## 2022-09-18 HISTORY — PX: ESOPHAGOGASTRODUODENOSCOPY (EGD) WITH PROPOFOL: SHX5813

## 2022-09-18 LAB — COMPREHENSIVE METABOLIC PANEL
ALT: 22 U/L (ref 0–44)
AST: 57 U/L — ABNORMAL HIGH (ref 15–41)
Albumin: 2.2 g/dL — ABNORMAL LOW (ref 3.5–5.0)
Alkaline Phosphatase: 177 U/L — ABNORMAL HIGH (ref 38–126)
Anion gap: 11 (ref 5–15)
BUN: 7 mg/dL (ref 6–20)
CO2: 26 mmol/L (ref 22–32)
Calcium: 7.4 mg/dL — ABNORMAL LOW (ref 8.9–10.3)
Chloride: 99 mmol/L (ref 98–111)
Creatinine, Ser: 1.17 mg/dL (ref 0.61–1.24)
GFR, Estimated: >60 mL/min (ref >60)
Glucose, Bld: 89 mg/dL (ref 70–99)
Potassium: 2.8 mmol/L — ABNORMAL LOW (ref 3.5–5.1)
Sodium: 136 mmol/L (ref 135–145)
Total Bilirubin: 2 mg/dL — ABNORMAL HIGH (ref 0.3–1.2)
Total Protein: 6.3 g/dL — ABNORMAL LOW (ref 6.5–8.1)

## 2022-09-18 LAB — CBC
HCT: 32.6 % — ABNORMAL LOW (ref 39.0–52.0)
HCT: 35 % — ABNORMAL LOW (ref 39.0–52.0)
HCT: 36.5 % — ABNORMAL LOW (ref 39.0–52.0)
Hemoglobin: 11.3 g/dL — ABNORMAL LOW (ref 13.0–17.0)
Hemoglobin: 12.1 g/dL — ABNORMAL LOW (ref 13.0–17.0)
Hemoglobin: 12.3 g/dL — ABNORMAL LOW (ref 13.0–17.0)
MCH: 35.9 pg — ABNORMAL HIGH (ref 26.0–34.0)
MCH: 36.2 pg — ABNORMAL HIGH (ref 26.0–34.0)
MCH: 35.4 pg — ABNORMAL HIGH (ref 26.0–34.0)
MCHC: 34.7 g/dL (ref 30.0–36.0)
MCHC: 34.6 g/dL (ref 30.0–36.0)
MCHC: 33.7 g/dL (ref 30.0–36.0)
MCV: 103.5 fL — ABNORMAL HIGH (ref 80.0–100.0)
MCV: 104.8 fL — ABNORMAL HIGH (ref 80.0–100.0)
MCV: 105.2 fL — ABNORMAL HIGH (ref 80.0–100.0)
Platelets: 145 10*3/uL — ABNORMAL LOW (ref 150–400)
Platelets: 133 10*3/uL — ABNORMAL LOW (ref 150–400)
Platelets: 142 10*3/uL — ABNORMAL LOW (ref 150–400)
RBC: 3.15 MIL/uL — ABNORMAL LOW (ref 4.22–5.81)
RBC: 3.34 MIL/uL — ABNORMAL LOW (ref 4.22–5.81)
RBC: 3.47 MIL/uL — ABNORMAL LOW (ref 4.22–5.81)
RDW: 15.2 % (ref 11.5–15.5)
RDW: 15.3 % (ref 11.5–15.5)
RDW: 15.1 % (ref 11.5–15.5)
WBC: 7.3 10*3/uL (ref 4.0–10.5)
WBC: 5.6 10*3/uL (ref 4.0–10.5)
WBC: 4.6 10*3/uL (ref 4.0–10.5)
nRBC: 0 % (ref 0.0–0.2)
nRBC: 0 % (ref 0.0–0.2)
nRBC: 0 % (ref 0.0–0.2)

## 2022-09-18 LAB — BASIC METABOLIC PANEL
Anion gap: 9 (ref 5–15)
BUN: 5 mg/dL — ABNORMAL LOW (ref 6–20)
CO2: 23 mmol/L (ref 22–32)
Calcium: 8.2 mg/dL — ABNORMAL LOW (ref 8.9–10.3)
Chloride: 98 mmol/L (ref 98–111)
Creatinine, Ser: 1.05 mg/dL (ref 0.61–1.24)
GFR, Estimated: >60 mL/min (ref >60)
Glucose, Bld: 145 mg/dL — ABNORMAL HIGH (ref 70–99)
Potassium: 3.7 mmol/L (ref 3.5–5.1)
Sodium: 130 mmol/L — ABNORMAL LOW (ref 135–145)

## 2022-09-18 LAB — MAGNESIUM: Magnesium: 2.1 mg/dL (ref 1.7–2.4)

## 2022-09-18 LAB — POCT I-STAT, CHEM 8
BUN: 7 mg/dL (ref 6–20)
Calcium, Ion: 1 mmol/L — ABNORMAL LOW (ref 1.15–1.40)
Chloride: 101 mmol/L (ref 98–111)
Creatinine, Ser: 0.9 mg/dL (ref 0.61–1.24)
Glucose, Bld: 82 mg/dL (ref 70–99)
HCT: 35 % — ABNORMAL LOW (ref 39.0–52.0)
Hemoglobin: 11.9 g/dL — ABNORMAL LOW (ref 13.0–17.0)
Potassium: 3.8 mmol/L (ref 3.5–5.1)
Sodium: 137 mmol/L (ref 135–145)
TCO2: 25 mmol/L (ref 22–32)

## 2022-09-18 LAB — HEMOGLOBIN AND HEMATOCRIT, BLOOD
HCT: 39.4 % (ref 39.0–52.0)
Hemoglobin: 13.8 g/dL (ref 13.0–17.0)

## 2022-09-18 LAB — PROTIME-INR
INR: 1.4 — ABNORMAL HIGH (ref 0.8–1.2)
Prothrombin Time: 16.7 seconds — ABNORMAL HIGH (ref 11.4–15.2)

## 2022-09-18 LAB — MRSA NEXT GEN BY PCR, NASAL: MRSA by PCR Next Gen: NOT DETECTED

## 2022-09-18 LAB — HIV ANTIBODY (ROUTINE TESTING W REFLEX): HIV Screen 4th Generation wRfx: NONREACTIVE

## 2022-09-18 LAB — BRAIN NATRIURETIC PEPTIDE: B Natriuretic Peptide: 570.7 pg/mL — ABNORMAL HIGH (ref 0.0–100.0)

## 2022-09-18 LAB — CBG MONITORING, ED: Glucose-Capillary: 87 mg/dL (ref 70–99)

## 2022-09-18 SURGERY — ESOPHAGOGASTRODUODENOSCOPY (EGD) WITH PROPOFOL
Anesthesia: Monitor Anesthesia Care

## 2022-09-18 MED ORDER — FENTANYL CITRATE PF 50 MCG/ML IJ SOSY
12.5000 ug | PREFILLED_SYRINGE | INTRAMUSCULAR | Status: DC | PRN
  Administered 2022-09-18 – 2022-09-19 (×3): 50 ug via INTRAVENOUS
  Filled 2022-09-18 (×3): qty 1

## 2022-09-18 MED ORDER — ALBUTEROL SULFATE (2.5 MG/3ML) 0.083% IN NEBU: 2.5000 mg | INHALATION_SOLUTION | RESPIRATORY_TRACT | Status: DC | PRN

## 2022-09-18 MED ORDER — POTASSIUM CHLORIDE 10 MEQ/100ML IV SOLN: 10.0000 meq | INTRAVENOUS | Status: DC

## 2022-09-18 MED ORDER — LIDOCAINE 2% (20 MG/ML) 5 ML SYRINGE
INTRAMUSCULAR | Status: DC | PRN
  Administered 2022-09-18: 60 mg via INTRAVENOUS

## 2022-09-18 MED ORDER — OXYCODONE HCL 5 MG PO TABS
15.0000 mg | ORAL_TABLET | Freq: Four times a day (QID) | ORAL | Status: DC | PRN
  Administered 2022-09-18 – 2022-09-21 (×5): 15 mg via ORAL
  Filled 2022-09-18 (×5): qty 3

## 2022-09-18 MED ORDER — CARVEDILOL 3.125 MG PO TABS
3.1250 mg | ORAL_TABLET | Freq: Two times a day (BID) | ORAL | Status: DC
  Administered 2022-09-18 – 2022-09-19 (×4): 3.125 mg via ORAL
  Filled 2022-09-18 (×4): qty 1

## 2022-09-18 MED ORDER — ONDANSETRON HCL 4 MG/2ML IJ SOLN
4.0000 mg | Freq: Four times a day (QID) | INTRAMUSCULAR | Status: DC | PRN
  Administered 2022-09-19: 4 mg via INTRAVENOUS
  Filled 2022-09-18: qty 2

## 2022-09-18 MED ORDER — SODIUM CHLORIDE 0.9 % IV SOLN: INTRAVENOUS | Status: DC

## 2022-09-18 MED ORDER — ONDANSETRON HCL 4 MG PO TABS: 4.0000 mg | ORAL_TABLET | Freq: Four times a day (QID) | ORAL | Status: DC | PRN

## 2022-09-18 MED ORDER — PANTOPRAZOLE SODIUM 40 MG IV SOLR: 40.0000 mg | Freq: Two times a day (BID) | INTRAVENOUS | Status: DC

## 2022-09-18 MED ORDER — SUCRALFATE 1 GM/10ML PO SUSP
1.0000 g | Freq: Three times a day (TID) | ORAL | Status: DC
  Administered 2022-09-18 – 2022-09-21 (×12): 1 g via ORAL
  Filled 2022-09-18 (×12): qty 10

## 2022-09-18 MED ORDER — POTASSIUM CHLORIDE CRYS ER 20 MEQ PO TBCR
40.0000 meq | EXTENDED_RELEASE_TABLET | Freq: Two times a day (BID) | ORAL | Status: AC
  Administered 2022-09-18 (×2): 40 meq via ORAL
  Filled 2022-09-18 (×2): qty 2

## 2022-09-18 MED ORDER — SUCCINYLCHOLINE CHLORIDE 200 MG/10ML IV SOSY
PREFILLED_SYRINGE | INTRAVENOUS | Status: DC | PRN
  Administered 2022-09-18: 120 mg via INTRAVENOUS

## 2022-09-18 MED ORDER — FENTANYL CITRATE (PF) 250 MCG/5ML IJ SOLN
INTRAMUSCULAR | Status: DC | PRN
  Administered 2022-09-18: 50 ug via INTRAVENOUS

## 2022-09-18 MED ORDER — CHLORDIAZEPOXIDE HCL 5 MG PO CAPS
15.0000 mg | ORAL_CAPSULE | Freq: Three times a day (TID) | ORAL | Status: DC
  Filled 2022-09-18: qty 1

## 2022-09-18 MED ORDER — DEXAMETHASONE SODIUM PHOSPHATE 10 MG/ML IJ SOLN
INTRAMUSCULAR | Status: DC | PRN
  Administered 2022-09-18: 10 mg via INTRAVENOUS

## 2022-09-18 MED ORDER — PROPOFOL 10 MG/ML IV BOLUS
INTRAVENOUS | Status: DC | PRN
  Administered 2022-09-18: 30 mg via INTRAVENOUS
  Administered 2022-09-18: 200 mg via INTRAVENOUS
  Administered 2022-09-18: 30 mg via INTRAVENOUS

## 2022-09-18 MED ORDER — NICOTINE 21 MG/24HR TD PT24
21.0000 mg | MEDICATED_PATCH | Freq: Every day | TRANSDERMAL | Status: DC
  Administered 2022-09-18 – 2022-09-21 (×4): 21 mg via TRANSDERMAL
  Filled 2022-09-18 (×4): qty 1

## 2022-09-18 MED ORDER — CHLORDIAZEPOXIDE HCL 10 MG PO CAPS: 10.0000 mg | ORAL_CAPSULE | Freq: Three times a day (TID) | ORAL | Status: DC

## 2022-09-18 MED ORDER — ONDANSETRON HCL 4 MG/2ML IJ SOLN
INTRAMUSCULAR | Status: DC | PRN
  Administered 2022-09-18: 4 mg via INTRAVENOUS

## 2022-09-18 MED ORDER — CLONIDINE HCL 0.3 MG/24HR TD PTWK: 0.3000 mg | MEDICATED_PATCH | TRANSDERMAL | Status: DC

## 2022-09-18 MED ORDER — CHLORDIAZEPOXIDE HCL 5 MG PO CAPS
15.0000 mg | ORAL_CAPSULE | Freq: Three times a day (TID) | ORAL | Status: DC
  Administered 2022-09-18 – 2022-09-20 (×9): 15 mg via ORAL
  Filled 2022-09-18 (×11): qty 3

## 2022-09-18 MED ORDER — MAGNESIUM SULFATE 4 GM/100ML IV SOLN
4.0000 g | Freq: Once | INTRAVENOUS | Status: AC
  Administered 2022-09-18: 4 g via INTRAVENOUS
  Filled 2022-09-18: qty 100

## 2022-09-18 MED ORDER — PHENYLEPHRINE 80 MCG/ML (10ML) SYRINGE FOR IV PUSH (FOR BLOOD PRESSURE SUPPORT)
PREFILLED_SYRINGE | INTRAVENOUS | Status: DC | PRN
  Administered 2022-09-18: 80 ug via INTRAVENOUS
  Administered 2022-09-18 (×2): 40 ug via INTRAVENOUS

## 2022-09-18 MED ORDER — ORAL CARE MOUTH RINSE: 15.0000 mL | OROMUCOSAL | Status: DC | PRN

## 2022-09-18 MED ORDER — METOCLOPRAMIDE HCL 5 MG/ML IJ SOLN
10.0000 mg | Freq: Once | INTRAMUSCULAR | Status: AC
  Administered 2022-09-18: 10 mg via INTRAVENOUS
  Filled 2022-09-18: qty 2

## 2022-09-18 MED ORDER — POTASSIUM CHLORIDE 10 MEQ/100ML IV SOLN
10.0000 meq | INTRAVENOUS | Status: AC
  Administered 2022-09-18 (×4): 10 meq via INTRAVENOUS
  Filled 2022-09-18 (×4): qty 100

## 2022-09-18 MED ORDER — FENTANYL CITRATE (PF) 100 MCG/2ML IJ SOLN
INTRAMUSCULAR | Status: AC
  Filled 2022-09-18: qty 2

## 2022-09-18 MED ORDER — CLONIDINE HCL 0.2 MG/24HR TD PTWK: 0.2000 mg | MEDICATED_PATCH | TRANSDERMAL | Status: DC

## 2022-09-18 MED ORDER — HYDRALAZINE HCL 20 MG/ML IJ SOLN: 10.0000 mg | Freq: Four times a day (QID) | INTRAMUSCULAR | Status: DC | PRN

## 2022-09-18 SURGICAL SUPPLY — 15 items

## 2022-09-18 NOTE — Transfer of Care (Signed)
Immediate Anesthesia Transfer of Care Note  Patient: Timothy [name]  Procedure(s) Performed: ESOPHAGOGASTRODUODENOSCOPY (EGD) WITH PROPOFOL  Patient Location: PACU  Anesthesia Type:General  Level of Consciousness: patient cooperative and responds to stimulation  Airway & Oxygen Therapy: Patient Spontanous Breathing  Post-op Assessment: Report given to RN and Post -op Vital signs reviewed and stable  Post vital signs: Reviewed and stable  Last Vitals:  Vitals Value Taken Time  BP 115/86 09/18/22 1120  Temp    Pulse 81 09/18/22 1122  Resp 26 09/18/22 1122  SpO2 91 % 09/18/22 1122  Vitals shown include unvalidated device data.  Last Pain:  Vitals:   09/18/22 1013  TempSrc: Tympanic  PainSc: 8          Complications:  Encounter Notable Events  Notable Event Outcome Phase Comment  Difficult to intubate - expected  Intraprocedure Filed from anesthesia note documentation.

## 2022-09-18 NOTE — Op Note (Addendum)
[facility] Patient Name: Timothy [name] Procedure Date : 09/18/2022 MRN: [id number] Attending MD: Thornton Park MD, MD, [id number] Date of Birth: [birth year] CSN: [id number] Age: 52 Admit Type: Inpatient Procedure:                Upper GI endoscopy Indications:              Hematemesis, history of gastric ulcers on EGD 2022 Providers:                Thornton Park MD, MD, Jaci Carrel, RN,                            Benetta Spar, Technician Referring MD:              Medicines:                Monitored Anesthesia Care Complications:            No immediate complications. Estimated Blood Loss:     Estimated blood loss was minimal. Procedure:                Pre-Anesthesia Assessment:                           - Prior to the procedure, a History and Physical                            was performed, and patient medications and                            allergies were reviewed. The patient's tolerance of                            previous anesthesia was also reviewed. The risks                            and benefits of the procedure and the sedation                            options and risks were discussed with the patient.                            All questions were answered, and informed consent                            was obtained. Prior Anticoagulants: The patient has                            taken no anticoagulant or antiplatelet agents. ASA                            Grade Assessment: III - A patient with severe                            systemic disease. After reviewing the risks and  benefits, the patient was deemed in satisfactory                            condition to undergo the procedure.                           After obtaining informed consent, the endoscope was                            passed under direct vision. Throughout the                            procedure, the patient's blood pressure, pulse, and                             oxygen saturations were monitored continuously. The                            [id number]-[id number] ([id number] Olympus endoscope was introduced                            through the mouth, and advanced to the third part                            of duodenum. The upper GI endoscopy was                            accomplished without difficulty. The patient                            tolerated the procedure well. Scope In: Scope Out: Findings:      LA Grade D (one or more mucosal breaks involving at least 75% of       esophageal circumference) esophagitis with associated ulceration was       found 38 cm from the incisors. There is an overlying clotUnable to       assess for Barrett's Esophagus given the extent of inflammation.      Diffuse mild inflammation characterized by congestion (edema) was found       in the gastric fundus and in the gastric body. There is a subtle       cobblestone appearance to the gastric mucosa. Biopsies were taken from       the antrum, body, and fundus with a cold forceps for histology.       Estimated blood loss was minimal.      The examined duodenum was normal. Impression:               - LA Grade D reflux esophagitis with ulceration,                            recently bleeding.                           - Gastritis. Biopsied.                           -  No esophageal or gastric varices. Recommendation:           - Return patient to hospital ward for ongoing care.                           - Advance diet as tolerated.                           - Continue present medications including PPI BID                            for at least 8 weeks.                           - Add Carafate 1g QID x 14 days.                           - Reflux lifestyle modifications including keeping                            the head of the bed elevated.                           - Avoid all NSAIDs.                           - Continues serial hgb/hct with  transfusion as                            indicated.                           - Await pathology results.                           - Repeat upper endoscopy in 8 weeks with Dr. Gala Romney                            to check healing and reassess for concurrent                            diagnoses.                           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                           Findings and recommendations reviewed with Dr.                            Gala Romney, who is arranging  for outpatient follow-up at                            his office in a few weeks.                           The inpatient GI team will move to stand-by. Please                            call the on-call gastroenterologist with any                            additional questions or concerns during this                            hospitalization. Procedure Code(s):        --- Professional ---                           [phone], Esophagogastroduodenoscopy, flexible,                            transoral; with biopsy, single or multiple Diagnosis Code(s):        --- Professional ---                           K21.01, Gastro-esophageal reflux disease with                            esophagitis, with bleeding                           K29.70, Gastritis, unspecified, without bleeding                           K92.0, Hematemesis CPT copyright 2022 [employer]. All rights reserved. The codes documented in this report are preliminary and upon coder review may  be revised to meet current compliance requirements. Thornton Park MD, MD 09/18/2022 11:25:16 AM This report has been signed electronically. Number of Addenda: 0

## 2022-09-18 NOTE — Progress Notes (Signed)
PROGRESS NOTE                                                                                                                                                                                                             Patient Demographics:    Timothy [name], is a 52 y.o. male, DOB - [birth year], ZOX:[id number]  Outpatient Primary MD for the patient is Redmond School, MD    LOS - 1  Admit date - 09/17/2022    Chief Complaint  Patient presents with   GI Bleeding       Brief Narrative (HPI from H&P)    53 y.o. male with medical history significant of Barrett's esophagus, GERD, alcoholic gastritis and PUD with gastric ulcers, HTN, tobacco abuse, prior history of Wolff-Parkinson-White syndrome s/p ablation, fatty liver disease, chronic combined CHF, alcohol abuse,prior history of coronary disease (50% stenosis small diagonal) who presents to ED BIB EMS for 3 episodes of bloody emesis on the day of admission, he was having some upper GI discomfort for 3 to 4 days.  He also takes a daily aspirin and frequently uses BC powders.  In the past he has had a EGD showing some gastritis with some gastric ulcers.  He was admitted for upper GI bleed.   Subjective:    Timothy [name] today has, No headache, No chest pain, No abdominal pain - No Nausea, No new weakness tingling or numbness, no SOB   Assessment  & Plan :    Acute upper GI bleed likely due to gastric ulcers in a patient with ongoing alcohol abuse and frequent use of NSAIDs. he has been strictly counseled to quit alcohol and NSAID use, offending medications including aspirin will be held he will be on IV PPI, bowel rest monitor CBC, he is agreeable for transfusion if needed.  GI has been consulted most likely will require EGD.  Does have known history of Barrett's esophagus and gastric ulcers.  Currently no evidence of portal hypertension based on his previous EGD in 2022.   Smoking  and alcohol abuse - inconsistent in his history.  Placed on Librium and CIWA protocol, counseled to quit.  CAD.  No acute issue.  For now aspirin on hold, symptom-free will monitor.  Will try and keep him globin over 8.  Continue beta-blocker and monitor.  Hypertension.  Drop home  Catapres dose, low-dose beta-blocker as needed hydralazine and monitor.  Fatty liver.  Outpatient follow-up with PCP and GI.  Chronic combined systolic and diastolic CHF EF around 78% on last echocardiogram.  Currently compensated monitor.  Severe hypokalemia and hypomagnesemia.  Replaced.        Condition - Extremely Guarded  Family Communication  :  None  Code Status :  Full  Consults  :  GI  PUD Prophylaxis : PPI   Procedures  :            Disposition Plan  :    Status is: Inpatient  DVT Prophylaxis  :    SCDs Start: 09/18/22 0113    Lab Results  Component Value Date   PLT 145 (L) 09/18/2022    Diet :  Diet Order             Diet NPO time specified Except for: Sips with Meds  Diet effective now                    Inpatient Medications  Scheduled Meds:  chlordiazePOXIDE  15 mg Oral TID   [START ON 09/21/2022] cloNIDine  0.3 mg Transdermal Q Sun   folic acid  1 mg Oral Daily   multivitamin with minerals  1 tablet Oral Daily   [START ON 09/21/2022] pantoprazole  40 mg Intravenous Q12H   potassium chloride  40 mEq Oral BID   thiamine  100 mg Oral Daily   Or   thiamine  100 mg Intravenous Daily   Continuous Infusions:  sodium chloride 75 mL/hr at 09/18/22 0559   pantoprazole 8 mg/hr (09/18/22 0115)   potassium chloride 10 mEq (09/18/22 0749)   promethazine (PHENERGAN) injection (IM or IVPB)     PRN Meds:.albuterol, fentaNYL (SUBLIMAZE) injection, LORazepam, ondansetron **OR** ondansetron (ZOFRAN) IV, promethazine (PHENERGAN) injection (IM or IVPB)  Antibiotics  :    Anti-infectives (From admission, onward)    None         Objective:   Vitals:   09/18/22  0429 09/18/22 0445 09/18/22 0452 09/18/22 0700  BP:  (!) 145/102  (!) 125/90  Pulse:  99 100 94  Resp:    (!) 22  Temp: 98.8 F (37.1 C)     TempSrc: Oral     SpO2:  94%  92%  Weight:      Height:        Wt Readings from Last 3 Encounters:  09/17/22 104.3 kg  09/24/21 99.8 kg  08/27/21 108.9 kg     Intake/Output Summary (Last 24 hours) at 09/18/2022 0818 Last data filed at 09/18/2022 0727 Gross per 24 hour  Intake 90.63 ml  Output --  Net 90.63 ml     Physical Exam  Awake Alert, No new F.N deficits, Normal affect [place].AT,PERRAL Supple Neck, No JVD,   Symmetrical Chest wall movement, Good air movement bilaterally, CTAB RRR,No Gallops,Rubs or new Murmurs,  +ve B.Sounds, Abd Soft, No tenderness,   No Cyanosis, Clubbing or edema     RN pressure injury documentation:      Data Review:    Recent Labs  Lab 09/17/22 2018 09/17/22 2348 09/18/22 0300  WBC 10.4  --  7.3  HGB 14.2 13.8 11.3*  HCT 40.2 39.4 32.6*  PLT 207  --  145*  MCV 102.6*  --  103.5*  MCH 36.2*  --  35.9*  MCHC 35.3  --  34.7  RDW 15.1  --  15.2  LYMPHSABS 0.9  --   --  MONOABS 0.7  --   --   EOSABS 0.0  --   --   BASOSABS 0.1  --   --     Recent Labs  Lab 09/17/22 2018 09/17/22 2127 09/18/22 0114 09/18/22 0300 09/18/22 0533  NA 136  --   --   --  136  K 2.3*  --   --   --  2.8*  CL 95*  --   --   --  99  CO2 24  --   --   --  26  ANIONGAP 17*  --   --   --  11  GLUCOSE 115*  --   --   --  89  BUN 7  --   --   --  7  CREATININE 1.42*  --   --   --  1.17  AST 80*  --   --   --  57*  ALT 28  --   --   --  22  ALKPHOS 224*  --   --   --  177*  BILITOT 1.9*  --   --   --  2.0*  ALBUMIN 2.7*  --   --   --  2.2*  INR 1.2  --   --  1.4*  --   BNP  --  262.2* 570.7*  --   --   MG  --  1.3*  --   --   --   CALCIUM 8.6*  --   --   --  7.4*      Recent Labs  Lab 09/17/22 2018 09/17/22 2127 09/18/22 0114 09/18/22 0300 09/18/22 0533  INR 1.2  --   --  1.4*  --   BNP  --  262.2*  570.7*  --   --   MG  --  1.3*  --   --   --   CALCIUM 8.6*  --   --   --  7.4*    Recent Labs  Lab 09/17/22 2018 09/18/22 0300 09/18/22 0533  WBC 10.4 7.3  --   PLT 207 145*  --   CREATININE 1.42*  --  1.17    ------------------------------------------------------------------------------------------------------------------ No results for input(s): "CHOL", "HDL", "LDLCALC", "TRIG", "CHOLHDL", "LDLDIRECT" in the last 72 hours.  Lab Results  Component Value Date   HGBA1C 5.0 10/28/2014    No results for input(s): "TSH", "T4TOTAL", "T3FREE", "THYROIDAB" in the last 72 hours.  Invalid input(s): "FREET3" ------------------------------------------------------------------------------------------------------------------ Cardiac Enzymes No results for input(s): "CKMB", "TROPONINI", "MYOGLOBIN" in the last 168 hours.  Invalid input(s): "CK"  Micro Results No results found for this or any previous visit (from the past 240 hour(s)).  Radiology Reports DG Chest Port 1 View  Result Date: 09/17/2022 CLINICAL DATA:  Chest pain. EXAM: PORTABLE CHEST 1 VIEW COMPARISON:  September 24, 2021 FINDINGS: The cardiac silhouette is mildly enlarged. Low lung volumes are noted with mild atelectasis seen within the right lung base. There is no evidence of a pleural effusion or pneumothorax. The visualized skeletal structures are unremarkable. IMPRESSION: Low lung volumes with mild right basilar atelectasis. Electronically Signed   By: Virgina Norfolk M.D.   On: 09/17/2022 20:16      Signature  -   Lala Lund M.D on 09/18/2022 at 8:18 AM   -  To page go to [email or web address]

## 2022-09-18 NOTE — Anesthesia Preprocedure Evaluation (Addendum)
Anesthesia Evaluation  Patient identified by MRN, date of birth, ID band Patient awake    Reviewed: Allergy & Precautions, NPO status , Patient's Chart, lab work & pertinent test results  Airway Mallampati: II  TM Distance: >3 FB Neck ROM: Full    Dental   Pulmonary Current Smoker and Patient abstained from smoking.   breath sounds clear to auscultation       Cardiovascular hypertension, Pt. on medications and Pt. on home beta blockers + CAD and +CHF  + dysrhythmias  Rhythm:Regular Rate:Normal     Neuro/Psych CVA    GI/Hepatic PUD,GERD  ,,(+) Cirrhosis     substance abuse  alcohol useHematemesis    Endo/Other  negative endocrine ROS    Renal/GU negative Renal ROS     Musculoskeletal  (+) Arthritis ,    Abdominal   Peds  Hematology negative hematology ROS (+)   Anesthesia Other Findings   Reproductive/Obstetrics                             Anesthesia Physical Anesthesia Plan  ASA: 3  Anesthesia Plan: General   Post-op Pain Management: Minimal or no pain anticipated   Induction: Intravenous and Rapid sequence  PONV Risk Score and Plan: 1 and Dexamethasone and Ondansetron  Airway Management Planned: Oral ETT  Additional Equipment:   Intra-op Plan:   Post-operative Plan: Extubation in OR  Informed Consent: I have reviewed the patients History and Physical, chart, labs and discussed the procedure including the risks, benefits and alternatives for the proposed anesthesia with the patient or authorized representative who has indicated his/her understanding and acceptance.       Plan Discussed with:   Anesthesia Plan Comments:        Anesthesia Quick Evaluation

## 2022-09-18 NOTE — Telephone Encounter (Signed)
-----   Message from [place] Park, MD sent at 09/18/2022 11:32 AM EST ----- Recommend outpatient follow-up with an APP in 3-4 week and EGD in 8 weeks. Originally looked like he wanted to see Dr. Gala Romney. Family would prefer that he stay within [facility].  Thanks.  KLB

## 2022-09-18 NOTE — ED Notes (Signed)
Patient transported to Ultrasound 

## 2022-09-18 NOTE — Anesthesia Procedure Notes (Signed)
Procedure Name: Intubation Date/Time: 09/18/2022 10:50 AM  Performed by: Betha Loa, CRNAPre-anesthesia Checklist: Patient identified, Emergency Drugs available, Suction available and Patient being monitored Patient Re-evaluated:Patient Re-evaluated prior to induction Oxygen Delivery Method: Circle System Utilized Preoxygenation: Pre-oxygenation with 100% oxygen Induction Type: IV induction and Rapid sequence Laryngoscope Size: Mac, Glidescope and 4 Grade View: Grade I Tube type: Oral Tube size: 7.5 mm Number of attempts: 1 Airway Equipment and Method: Stylet, Video-laryngoscopy and Rigid stylet Placement Confirmation: ETT inserted through vocal cords under direct vision, positive ETCO2 and breath sounds checked- equal and bilateral Secured at: 22 cm Tube secured with: Tape Dental Injury: Teeth and Oropharynx as per pre-operative assessment  Difficulty Due To: Difficulty was anticipated, Difficult Airway- due to large tongue and Difficult Airway- due to reduced neck mobility Comments: Pt c/o R) neck pain with extension on preop exam (states has not seen a doctor to f/u with these symptoms).  Pt with full beard and large tongue.  Elective Glidescope planned.  Stretcher induction--pts head/neck in position of comfort per patient for induction and intubation.

## 2022-09-18 NOTE — Discharge Instructions (Addendum)
Follow with Primary MD [employer], MD and your gastroenterologist in 7 days   Get CBC, CMP, 2 view Chest X ray -  checked next visit with your primary MD   Activity: As tolerated with Full fall precautions use walker/cane & assistance as needed  Disposition Home    Diet: Heart Healthy   Special Instructions: If you have smoked or chewed Tobacco  in the last 2 yrs please stop smoking, stop any regular Alcohol  and or any Recreational drug use.  On your next visit with your primary care physician please Get Medicines reviewed and adjusted.  Please request your Prim.MD to go over all Hospital Tests and Procedure/Radiological results at the follow up, please get all Hospital records sent to your Prim MD by signing hospital release before you go home.  If you experience worsening of your admission symptoms, develop shortness of breath, life threatening emergency, suicidal or homicidal thoughts you must seek medical attention immediately by calling 911 or calling your MD immediately  if symptoms less severe.  You Must read complete instructions/literature along with all the possible adverse reactions/side effects for all the Medicines you take and that have been prescribed to you. Take any new Medicines after you have completely understood and accpet all the possible adverse reactions/side effects.

## 2022-09-18 NOTE — ED Notes (Signed)
Librium requested to be sent from the pharmacy at this time

## 2022-09-18 NOTE — Consult Note (Signed)
morning is Dr. Tarri Glenn on the gastroenterologist who did your dad's procedure      Consultation  Referring Provider:   [facility] Primary Care Physician:  Redmond School, MD Primary Gastroenterologist:  Dr. Gala Romney       Reason for Consultation:     Hematemesis, melena   Impression    Melena with hematemesis in setting of Goody powders, alcohol abuse HGB 11.3 ( 14,7)  MCV 103.5  BUN 7 Cr 1.17 3 g drop of hemoglobin, no elevation of BUN EGD 2022 Dr. Lavone Neri benign-appearing gastric ulcers, normal duodenum, possible Barrett's short segment  Hypokalemia Currently 2.8, continue to work towards getting above 3 for EGD today.  ETOH hepatitis, in setting of hepatic steatosis, possible liver nodularity on previous ultrasound/CT No previous evidence of portal hypertension though platelets repeat 145, INR slightly elevated 1.2 -Discriminate score is not above 35 -There is no evidence of encephalopathy  Thrombocytopenia Platelets 145  Acute anemia secondary to UGI bleed 14.2 on admission currently 11.3 Macrocytic likely in setting of chronic alcohol use  Nonischemic cardiomyopathy EF 4550% 2022 BNP slightly elevated 500, chest x-ray without evidence of heart failure  CAD nonobstructive Troponin stable 50, EKG without ST changes    LOS: 1 day     Plan   -Concern for peptic ulcer disease, Mallory-Weiss with previous vomiting prior, varices, esophagitis in setting of alcohol and BC powder.  Some concern for cirrhosis potentially with history of fatty liver, nodular liver, currently thrombocytopenia.  Will get abdominal ultrasound.  Continue to monitor INR. -Protonix continuous bolus with drip for 72 hours, then 40 mg IV BID - NPO at this time --Continue to monitor H&H with transfusion as needed to maintain hemoglobin greater than 7. -Patient with hypokalemia potassium 2.8 every send recheck 5 AM, continue to replete would need above at least 3 for endoscopic evaluation. -Plan for EGD  today if potassium repleted to evaluate for peptic ulcer disease, varices, gastritis, esophagitis. I thoroughly discussed the procedure to include nature, alternatives, benefits, and risks including but not limited to bleeding, perforation, infection, anesthesia/cardiac and pulmonary complications. Patient provides understanding and gave verbal consent to proceed. -10 mg IV Reglan 30 minutes prior to endoscopy -Alcohol Abstinence counseling discussed with patient as continued use is strongly associated with worsening liver disease progression  -Monitor for withdrawal symptoms while inpatient -Empiric treatment with folic acid, multivitamin and thiamine.    Thank you for your kind consultation, we will continue to follow.         HPI:   Timothy [name] is a 52 y.o. male with past medical history significant for hypertension, hyperlipidemia, nonischemic cardiomyopathy with moderate LV systolic dysfunction 10/23/3660 echo EF 94-76% grade 1 diastolic dysfunction normal valves., history WPW status post ablation 3 times, nonobstructing CAD, central sleep apnea, personal history of tubular adenoma, GERD with short segment Barrett's esophagus, alcohol abuse, chronic pain, hepatomegaly with fatty liver, gastric ulcer 2022, presents with abdominal pain and 3 episodes of hematemesis and melena.  Previous GI history: 05/12/2013 colonoscopy Dr. Gala Romney  at any [facility] for intermittent hematochezia showed hemorrhoids, 5 mm polyp splenic flexure, 7 mm polyp mid sigmoid adjacent diminutive polyp normal-appearing mucosa normal TI. 05/12/2013 upper endoscopy right upper quadrant pain, GERD erosive reflux esophagitis, hiatal hernia, gastritis 12/20/2020 EGD Dr. Gala Romney   normal distal esophagus, query short segment Barrett's, status post dilation, 2 benign-appearing gastric ulcers, normal duodenum 09/24/2021 CT abdomen pelvis with contrast for trauma right-sided pain with bruising right flank hematoma, hepatic steatosis  possible lobulated liver contour seen with cirrhosis 02/10/2021 abdominal ultrasound hepatic steatosis mild nodular contour liver possibly reflecting cirrhosis normal spleen.  He has never had episode like this. He had dark stools 3 days ago, had large volume dark/BRB blood last night that sent him to the ER.  He was having dry heaves and vomiting all that day, small yellow.  Mucus that he thinks comes from his sinuses.  Had fall in Dec with right hematoma, states can have severe pain with it, pain never went away but has been less.  Has chills but no fever.  Patient states he has reflux on famotidine as needed.  Continues to have dysphagia can be solids and liquids states dilation last year was not helpful. Denies yellowing of eyes or skin, has had darker urine. Has had bilateral leg swelling and potentially some swelling in his abdomen.  Denies weight loss. Patient has colon cancer in his father, possible liver issues in his mother but is not certain. Patient taking BC powders 3 times a week for headache. Drinks at least 2-6 beers daily, more on the weekend, for last 20 years, denies history of withdrawals. He smokes 1/2-1 pack a day.   Vitals in the ER show hypertension 167/116, tachycardic 115, 99% room air, afebrile. Labs noted white blood cell count 10.4, hemoglobin 14.2 repeat hemoglobin 11.3, MCV 102.6, platelets normal 207. Sodium 136, potassium low at 2.3 with magnesium 1.3, creatinine 1.42 baseline 1.1 AST 80, ALT 17, alk phos 224, total bilirubin 1.9, lipase 52. Started on Protonix infusion with drip  INR 1 point 4 repeat platelets 145, troponin 53 down from 58, BNP 570  Abnormal ED labs: Abnormal Labs Reviewed  COMPREHENSIVE METABOLIC PANEL - Abnormal; Notable for the following components:      Result Value   Potassium 2.3 (*)    Chloride 95 (*)    Glucose, Bld 115 (*)    Creatinine, Ser 1.42 (*)    Calcium 8.6 (*)    Albumin 2.7 (*)    AST 80 (*)    Alkaline  Phosphatase 224 (*)    Total Bilirubin 1.9 (*)    GFR, Estimated 59 (*)    Anion gap 17 (*)    All other components within normal limits  CBC WITH DIFFERENTIAL/PLATELET - Abnormal; Notable for the following components:   RBC 3.92 (*)    MCV 102.6 (*)    MCH 36.2 (*)    Neutro Abs 8.7 (*)    All other components within normal limits  LIPASE, BLOOD - Abnormal; Notable for the following components:   Lipase 52 (*)    All other components within normal limits  BRAIN NATRIURETIC PEPTIDE - Abnormal; Notable for the following components:   B Natriuretic Peptide 262.2 (*)    All other components within normal limits  MAGNESIUM - Abnormal; Notable for the following components:   Magnesium 1.3 (*)    All other components within normal limits  BRAIN NATRIURETIC PEPTIDE - Abnormal; Notable for the following components:   B Natriuretic Peptide 570.7 (*)    All other components within normal limits  CBC - Abnormal; Notable for the following components:   RBC 3.15 (*)    Hemoglobin 11.3 (*)    HCT 32.6 (*)    MCV 103.5 (*)    MCH 35.9 (*)    Platelets 145 (*)    All other components within normal limits  PROTIME-INR - Abnormal; Notable for the following components:   Prothrombin Time 16.7 (*)  INR 1.4 (*)    All other components within normal limits  COMPREHENSIVE METABOLIC PANEL - Abnormal; Notable for the following components:   Potassium 2.8 (*)    Calcium 7.4 (*)    Total Protein 6.3 (*)    Albumin 2.2 (*)    AST 57 (*)    Alkaline Phosphatase 177 (*)    Total Bilirubin 2.0 (*)    All other components within normal limits  TROPONIN I (HIGH SENSITIVITY) - Abnormal; Notable for the following components:   Troponin I (High Sensitivity) 58 (*)    All other components within normal limits  TROPONIN I (HIGH SENSITIVITY) - Abnormal; Notable for the following components:   Troponin I (High Sensitivity) 53 (*)    All other components within normal limits     Past Medical History:   Diagnosis Date   Anxiety    Arthritis    CAD (coronary artery disease) 2007   50% stenosis small diagonal   Central sleep apnea    Chronic pain    since 1998 has been on chronic pain medication   Fatty liver    GERD (gastroesophageal reflux disease)    Hemorrhoids    Hepatomegaly 10/03/2014   HTN (hypertension)    Palpitations    Syncope    Tachycardia    Tobacco abuse    Tubular adenoma    Wolff-Parkinson-White (WPW) syndrome    says was cured with ablation    Surgical History:  He  has a past surgical history that includes Atrial ablation surgery; Appendectomy; Adenoidectomy; Hand surgery; Shoulder surgery; Colonoscopy; AV node ablation; Cardiac electrophysiology study and ablation; Colonoscopy with propofol (N/A, 05/12/2013); Esophagogastroduodenoscopy (egd) with propofol (N/A, 05/12/2013); biopsy (N/A, 05/12/2013); polypectomy (N/A, 05/12/2013); Hemorrhoid banding (06/22/13); Cardiac catheterization (N/A, 05/08/2016); Esophagogastroduodenoscopy (egd) with propofol (N/A, 12/20/2020); Esophageal dilation (12/20/2020); biopsy (12/20/2020); and Esophageal dilation (N/A, 12/20/2020). Family History:  His family history includes Breast cancer in his mother; Colon cancer in his father; Coronary artery disease in his father; Hypertension in his mother. Social History:   reports that he has been smoking cigarettes. He has a 12.50 pack-year smoking history. He has never used smokeless tobacco. He reports current alcohol use. He reports current drug use. Drug: Marijuana.  Prior to Admission medications   Medication Sig Start Date End Date Taking? Authorizing Provider  albuterol (PROVENTIL) (2.5 MG/3ML) 0.083% nebulizer solution Take 2.5 mg by nebulization every 4 (four) hours as needed. 07/17/21  Yes [provider]  albuterol (VENTOLIN HFA) 108 (90 Base) MCG/ACT inhaler Inhale 2 puffs into the lungs every 6 (six) hours as needed for wheezing or shortness of breath.   Yes [provider]   carvedilol (COREG) 25 MG tablet Take 1 tablet (25 mg total) by mouth 2 (two) times daily with a meal. 12/20/20  Yes Emokpae, Courage, MD  Cholecalciferol (VITAMIN D3) 25 MCG (1000 UT) CAPS Take 2 capsules by mouth every evening.   Yes [provider]  cloNIDine (CATAPRES - DOSED IN MG/24 HR) 0.3 mg/24hr patch Place 1 patch (0.3 mg total) onto the skin once a week. Patient taking differently: Place 0.3 mg onto the skin once a week. Sunday - placed on back 12/20/20  Yes Emokpae, Courage, MD  cloNIDine (CATAPRES) 0.2 MG tablet Take 1 tablet (0.2 mg total) by mouth 2 (two) times daily as needed (BP >150/90). 12/20/20  Yes Emokpae, Courage, MD  famotidine (PEPCID) 20 MG tablet Take 20 mg by mouth daily.   Yes [provider]  folic acid (FOLVITE) 1 MG tablet Take 1 tablet (1 mg total) by mouth daily. 12/21/20  Yes Emokpae, Courage, MD  furosemide (LASIX) 40 MG tablet Take 1 tablet (40 mg total) by mouth daily. Patient taking differently: Take 40 mg by mouth 2 (two) times daily. 02/12/21  Yes Johnson, Clanford L, MD  hydrOXYzine (ATARAX/VISTARIL) 25 MG tablet Take 1 tablet (25 mg total) by mouth 3 (three) times daily as needed for anxiety or nausea. 12/20/20  Yes Roxan Hockey, MD  Multiple Vitamin (MULTIVITAMIN WITH MINERALS) TABS tablet Take 1 tablet by mouth daily. 12/21/20  Yes Emokpae, Courage, MD  oxyCODONE (ROXICODONE) 15 MG immediate release tablet Take 15 mg by mouth every 6 (six) hours as needed for pain. 03/12/16  Yes [provider]  spironolactone (ALDACTONE) 25 MG tablet Take 1 tablet (25 mg total) by mouth every morning. Patient taking differently: Take 25 mg by mouth daily as needed (for swelling of feet). 12/20/20 09/18/23 Yes Emokpae, Courage, MD  sucralfate (CARAFATE) 1 GM/10ML suspension Take 1 g by mouth 2 (two) times daily. 06/23/22  Yes [provider]  losartan (COZAAR) 25 MG tablet Take 1 tablet (25 mg total) by mouth daily. 12/21/20   Roxan Hockey, MD   nystatin cream (MYCOSTATIN) Apply 1 Application topically daily as needed for dry skin. 09/10/21   [provider]  dexlansoprazole (DEXILANT) 60 MG capsule Take 1 capsule (60 mg total) by mouth daily. 12/25/20 02/09/21  Daneil Dolin, MD    Current Facility-Administered Medications  Medication Dose Route Frequency Provider Last Rate Last Admin   0.9 %  sodium chloride infusion   Intravenous Continuous Thurnell Lose, MD 75 mL/hr at 09/18/22 0559 Rate Change at 09/18/22 0559   albuterol (PROVENTIL) (2.5 MG/3ML) 0.083% nebulizer solution 2.5 mg  2.5 mg Nebulization Q2H PRN Clance Boll, MD       [START ON 09/21/2022] cloNIDine (CATAPRES - Dosed in mg/24 hr) patch 0.3 mg  0.3 mg Transdermal Q Christel Mormon, Victoriano Lain A, MD       fentaNYL (SUBLIMAZE) injection 12.5-50 mcg  12.5-50 mcg Intravenous Q2H PRN Clance Boll, MD       folic acid (FOLVITE) tablet 1 mg  1 mg Oral Daily Carlisle Cater, PA-C   1 mg at 09/17/22 2337   LORazepam (ATIVAN) tablet 1-4 mg  1-4 mg Oral Q1H PRN Carlisle Cater, PA-C   2 mg at 09/18/22 0501   multivitamin with minerals tablet 1 tablet  1 tablet Oral Daily Carlisle Cater, PA-C   1 tablet at 09/17/22 2337   ondansetron (ZOFRAN) tablet 4 mg  4 mg Oral Q6H PRN Clance Boll, MD       Or   ondansetron [facility]) injection 4 mg  4 mg Intravenous Q6H PRN Clance Boll, MD       [START ON 09/21/2022] pantoprazole (PROTONIX) injection 40 mg  40 mg Intravenous Q12H Myles Rosenthal A, MD       pantoprozole (PROTONIX) 80 mg /NS 100 mL infusion  8 mg/hr Intravenous Continuous Myles Rosenthal A, MD 10 mL/hr at 09/18/22 0115 8 mg/hr at 09/18/22 0115   potassium chloride 10 mEq in 100 mL IVPB  10 mEq Intravenous Q1 Hr x 5 Lala Lund K, MD 100 mL/hr at 09/18/22 0749 10 mEq at 09/18/22 0749   potassium chloride SA (KLOR-CON M) CR tablet 40 mEq  40 mEq Oral BID Thurnell Lose, MD   40 mEq at 09/18/22 0634   promethazine (PHENERGAN)  25 mg in sodium  chloride 0.9 % 50 mL IVPB  25 mg Intravenous Q6H PRN Carlisle Cater, PA-C       thiamine (VITAMIN B1) tablet 100 mg  100 mg Oral Daily Carlisle Cater, PA-C       Or   thiamine (VITAMIN B1) injection 100 mg  100 mg Intravenous Daily Carlisle Cater, PA-C   100 mg at 09/17/22 2337   Current Outpatient Medications  Medication Sig Dispense Refill   albuterol (PROVENTIL) (2.5 MG/3ML) 0.083% nebulizer solution Take 2.5 mg by nebulization every 4 (four) hours as needed.     albuterol (VENTOLIN HFA) 108 (90 Base) MCG/ACT inhaler Inhale 2 puffs into the lungs every 6 (six) hours as needed for wheezing or shortness of breath.     carvedilol (COREG) 25 MG tablet Take 1 tablet (25 mg total) by mouth 2 (two) times daily with a meal. 60 tablet 2   Cholecalciferol (VITAMIN D3) 25 MCG (1000 UT) CAPS Take 2 capsules by mouth every evening.     cloNIDine (CATAPRES - DOSED IN MG/24 HR) 0.3 mg/24hr patch Place 1 patch (0.3 mg total) onto the skin once a week. (Patient taking differently: Place 0.3 mg onto the skin once a week. Sunday - placed on back) 4 patch 5   cloNIDine (CATAPRES) 0.2 MG tablet Take 1 tablet (0.2 mg total) by mouth 2 (two) times daily as needed (BP >150/90). 30 tablet 4   famotidine (PEPCID) 20 MG tablet Take 20 mg by mouth daily.     folic acid (FOLVITE) 1 MG tablet Take 1 tablet (1 mg total) by mouth daily. 30 tablet 2   furosemide (LASIX) 40 MG tablet Take 1 tablet (40 mg total) by mouth daily. (Patient taking differently: Take 40 mg by mouth 2 (two) times daily.) 30 tablet 3   hydrOXYzine (ATARAX/VISTARIL) 25 MG tablet Take 1 tablet (25 mg total) by mouth 3 (three) times daily as needed for anxiety or nausea. 60 tablet 2   Multiple Vitamin (MULTIVITAMIN WITH MINERALS) TABS tablet Take 1 tablet by mouth daily. 120 tablet 2   oxyCODONE (ROXICODONE) 15 MG immediate release tablet Take 15 mg by mouth every 6 (six) hours as needed for pain.  0   spironolactone (ALDACTONE) 25 MG tablet Take 1 tablet  (25 mg total) by mouth every morning. (Patient taking differently: Take 25 mg by mouth daily as needed (for swelling of feet).) 30 tablet 2   sucralfate (CARAFATE) 1 GM/10ML suspension Take 1 g by mouth 2 (two) times daily.     losartan (COZAAR) 25 MG tablet Take 1 tablet (25 mg total) by mouth daily. 30 tablet 3   nystatin cream (MYCOSTATIN) Apply 1 Application topically daily as needed for dry skin.      Allergies as of 09/17/2022 - Review Complete 09/17/2022  Allergen Reaction Noted   Morphine Other (See Comments)     Review of Systems:    Constitutional: No weight loss, fever, chills, weakness or fatigue HEENT: Eyes: No change in vision               Ears, Nose, Throat:  No change in hearing or congestion Skin: No rash or itching Cardiovascular: No chest pain, chest pressure or palpitations   Respiratory: No SOB or cough Gastrointestinal: See HPI and otherwise negative Genitourinary: No dysuria or change in urinary frequency Neurological: No headache, dizziness or syncope Musculoskeletal: No new muscle or joint pain Hematologic: No bleeding or bruising Psychiatric: No history of depression or  anxiety     Physical Exam:  Vital signs in last 24 hours: Temp:  [98.6 F (37 C)-98.8 F (37.1 C)] 98.8 F (37.1 C) (01/04 0429) Pulse Rate:  [94-124] 94 (01/04 0700) Resp:  [14-30] 22 (01/04 0700) BP: (125-182)/(90-126) 125/90 (01/04 0700) SpO2:  [92 %-99 %] 92 % (01/04 0700) Weight:  [104.3 kg] 104.3 kg (01/03 2129) Last BM Date : 09/17/22 Last BM recorded by nurses in past 5 days No data recorded  General:   Pleasant, well developed male in no acute distress Head:  Normocephalic and atraumatic. Eyes: sclerae anicteric,conjunctive pale  Heart:  regular rate and rhythm Pulm: Clear anteriorly; no wheezing Abdomen:  Soft, Obese AB, Active bowel sounds.  Mild to moderate tenderness in the epigastrium and in the lower abdomen.  Has slight discoloration around abdomen, question rash,  slight induration.  Without guarding and Without rebound, No organomegaly appreciated.  No fluid wave, no shifting dullness.  Does have spider angiomas. Extremities:  With edema bleeding bilateral legs with compression socks on.  Msk:  Symmetrical without gross deformities. Peripheral pulses intact.  Neurologic:  Alert and  oriented x4;  No focal deficits.  No astrexsis Skin:   Dry and intact without significant lesions or rashes.  Spider angiomas, scab on nose, left eye ptosis.  No jaundice. Psychiatric:  Cooperative. Normal mood and affect.  LAB RESULTS: Recent Labs    09/17/22 2018 09/17/22 2348 09/18/22 0300  WBC 10.4  --  7.3  HGB 14.2 13.8 11.3*  HCT 40.2 39.4 32.6*  PLT 207  --  145*   BMET Recent Labs    09/17/22 2018 09/18/22 0533  NA 136 136  K 2.3* 2.8*  CL 95* 99  CO2 24 26  GLUCOSE 115* 89  BUN 7 7  CREATININE 1.42* 1.17  CALCIUM 8.6* 7.4*   LFT Recent Labs    09/18/22 0533  PROT 6.3*  ALBUMIN 2.2*  AST 57*  ALT 22  ALKPHOS 177*  BILITOT 2.0*   PT/INR Recent Labs    09/17/22 2018 09/18/22 0300  LABPROT 15.1 16.7*  INR 1.2 1.4*    STUDIES: DG Chest Port 1 View  Result Date: 09/17/2022 CLINICAL DATA:  Chest pain. EXAM: PORTABLE CHEST 1 VIEW COMPARISON:  September 24, 2021 FINDINGS: The cardiac silhouette is mildly enlarged. Low lung volumes are noted with mild atelectasis seen within the right lung base. There is no evidence of a pleural effusion or pneumothorax. The visualized skeletal structures are unremarkable. IMPRESSION: Low lung volumes with mild right basilar atelectasis. Electronically Signed   By: Virgina Norfolk M.D.   On: 09/17/2022 20:16     Vladimir Crofts  09/18/2022, 7:59 AM

## 2022-09-18 NOTE — ED Notes (Signed)
This RN assumed care of patient. Pt is well appearing & NAD at this time.

## 2022-09-18 NOTE — ED Notes (Signed)
Patient medication requested from the Provider regarding patient pain

## 2022-09-18 NOTE — ED Notes (Addendum)
Transfer of care report given to RN caring for room 12. Pt well appearing upon transfer.

## 2022-09-19 DIAGNOSIS — K922 Gastrointestinal hemorrhage, unspecified: Secondary | ICD-10-CM | POA: Diagnosis not present

## 2022-09-19 DIAGNOSIS — K920 Hematemesis: Secondary | ICD-10-CM

## 2022-09-19 LAB — COMPREHENSIVE METABOLIC PANEL
ALT: 24 U/L (ref 0–44)
AST: 62 U/L — ABNORMAL HIGH (ref 15–41)
Albumin: 2.4 g/dL — ABNORMAL LOW (ref 3.5–5.0)
Alkaline Phosphatase: 192 U/L — ABNORMAL HIGH (ref 38–126)
Anion gap: 11 (ref 5–15)
BUN: 8 mg/dL (ref 6–20)
CO2: 24 mmol/L (ref 22–32)
Calcium: 8.6 mg/dL — ABNORMAL LOW (ref 8.9–10.3)
Chloride: 97 mmol/L — ABNORMAL LOW (ref 98–111)
Creatinine, Ser: 1.03 mg/dL (ref 0.61–1.24)
GFR, Estimated: >60 mL/min (ref >60)
Glucose, Bld: 108 mg/dL — ABNORMAL HIGH (ref 70–99)
Potassium: 3.7 mmol/L (ref 3.5–5.1)
Sodium: 132 mmol/L — ABNORMAL LOW (ref 135–145)
Total Bilirubin: 2.3 mg/dL — ABNORMAL HIGH (ref 0.3–1.2)
Total Protein: 7.3 g/dL (ref 6.5–8.1)

## 2022-09-19 LAB — CBC WITH DIFFERENTIAL/PLATELET
Abs Immature Granulocytes: 0.02 10*3/uL (ref 0.00–0.07)
Basophils Absolute: 0 10*3/uL (ref 0.0–0.1)
Basophils Relative: 0 %
Eosinophils Absolute: 0 10*3/uL (ref 0.0–0.5)
Eosinophils Relative: 0 %
HCT: 37.7 % — ABNORMAL LOW (ref 39.0–52.0)
Hemoglobin: 12.9 g/dL — ABNORMAL LOW (ref 13.0–17.0)
Immature Granulocytes: 0 %
Lymphocytes Relative: 9 %
Lymphs Abs: 0.6 10*3/uL — ABNORMAL LOW (ref 0.7–4.0)
MCH: 35.3 pg — ABNORMAL HIGH (ref 26.0–34.0)
MCHC: 34.2 g/dL (ref 30.0–36.0)
MCV: 103.3 fL — ABNORMAL HIGH (ref 80.0–100.0)
Monocytes Absolute: 0.3 10*3/uL (ref 0.1–1.0)
Monocytes Relative: 4 %
Neutro Abs: 5.5 10*3/uL (ref 1.7–7.7)
Neutrophils Relative %: 87 %
Platelets: 160 10*3/uL (ref 150–400)
RBC: 3.65 MIL/uL — ABNORMAL LOW (ref 4.22–5.81)
RDW: 14.7 % (ref 11.5–15.5)
WBC: 6.4 10*3/uL (ref 4.0–10.5)
nRBC: 0 % (ref 0.0–0.2)

## 2022-09-19 LAB — HEPATITIS PANEL, ACUTE
HCV Ab: NONREACTIVE
Hep A IgM: NONREACTIVE
Hep B C IgM: NONREACTIVE
Hepatitis B Surface Ag: NONREACTIVE

## 2022-09-19 LAB — MAGNESIUM: Magnesium: 2.1 mg/dL (ref 1.7–2.4)

## 2022-09-19 LAB — HEMOGLOBIN A1C
Hgb A1c MFr Bld: 5 % (ref 4.8–5.6)
Mean Plasma Glucose: 97 mg/dL

## 2022-09-19 LAB — SURGICAL PATHOLOGY

## 2022-09-19 LAB — GLUCOSE, CAPILLARY: Glucose-Capillary: 97 mg/dL (ref 70–99)

## 2022-09-19 LAB — BRAIN NATRIURETIC PEPTIDE: B Natriuretic Peptide: 1526.5 pg/mL — ABNORMAL HIGH (ref 0.0–100.0)

## 2022-09-19 MED ORDER — NICOTINE POLACRILEX 2 MG MT GUM
2.0000 mg | CHEWING_GUM | OROMUCOSAL | Status: DC | PRN
  Administered 2022-09-19 – 2022-09-20 (×3): 2 mg via ORAL
  Filled 2022-09-19 (×6): qty 1

## 2022-09-19 MED ORDER — SPIRONOLACTONE 25 MG PO TABS
25.0000 mg | ORAL_TABLET | Freq: Every day | ORAL | Status: DC
  Administered 2022-09-19 – 2022-09-21 (×3): 25 mg via ORAL
  Filled 2022-09-19 (×3): qty 1

## 2022-09-19 MED ORDER — FUROSEMIDE 10 MG/ML IJ SOLN
20.0000 mg | Freq: Once | INTRAMUSCULAR | Status: AC
  Administered 2022-09-19: 20 mg via INTRAVENOUS
  Filled 2022-09-19: qty 2

## 2022-09-19 NOTE — TOC CM/SW Note (Signed)
  Transition of Care [facility]) Screening Note   Patient Details  Name: Timothy [name] Date of Birth: November 08, 1970      Transition of Care Department [facility]) has reviewed patient and no TOC needs have been identified at this time. We will continue to monitor patient advancement through interdisciplinary progression rounds. If new patient transition needs arise, please place a TOC consult.

## 2022-09-19 NOTE — Evaluation (Signed)
Physical Therapy Evaluation Patient Details Name: Timothy [name] MRN: [id number] DOB: [birth year] Today's Date: 09/19/2022  History of Present Illness  Pt is 52 year old presented to [facility] on  09/17/22 with GI bleed due to gastric ulcers with ongoing alcohol abuse and frequent use of NSAIDS. PMH - etoh abuse, Barrett's esophagus, alcoholic gastritis, HTN, fatty liver disease, chf, cad, wolff-parkinson-white syndrome  Clinical Impression  Pt did well with mobility. Used a rollator to improve gait and recommend one for home. No further PT needed.        Recommendations for follow up therapy are one component of a multi-disciplinary discharge planning process, led by the attending physician.  Recommendations may be updated based on patient status, additional functional criteria and insurance authorization.  Follow Up Recommendations No PT follow up      Assistance Recommended at Discharge None  Patient can return home with the following       Equipment Recommendations Rollator (4 wheels)  Recommendations for Other Services       Functional Status Assessment Patient has not had a recent decline in their functional status     Precautions / Restrictions Precautions Precautions: None      Mobility  Bed Mobility Overal bed mobility: Independent                  Transfers Overall transfer level: Modified independent Equipment used: None, Rollator (4 wheels)                    Ambulation/Gait Ambulation/Gait assistance: Modified independent (Device/Increase time) Gait Distance (Feet): 380 Feet Assistive device: Rolling walker (2 wheels), Rollator (4 wheels) Gait Pattern/deviations: Step-through pattern, Decreased stride length, Trunk flexed Gait velocity: decr Gait velocity interpretation: >2.62 ft/sec, indicative of community ambulatory   General Gait Details: Steady gait with use of rollator or rolling walker. Smoother gait pattern with rollator.  Stairs             Wheelchair Mobility    Modified Rankin (Stroke Patients Only)       Balance Overall balance assessment: Mild deficits observed, not formally tested                                           Pertinent Vitals/Pain Pain Assessment Pain Assessment: No/denies pain    Home Living Family/patient expects to be discharged to:: Private residence Living Arrangements: Spouse/significant other;Children Available Help at Discharge: Family             Home Equipment: None      Prior Function                       Hand Dominance   Dominant Hand: Right    Extremity/Trunk Assessment   Upper Extremity Assessment Upper Extremity Assessment: Overall WFL for tasks assessed    Lower Extremity Assessment Lower Extremity Assessment: RLE deficits/detail;LLE deficits/detail RLE Sensation: history of peripheral neuropathy LLE Sensation: history of peripheral neuropathy       Communication   Communication: No difficulties  Cognition Arousal/Alertness: Awake/alert Behavior During Therapy: WFL for tasks assessed/performed Overall Cognitive Status: Within Functional Limits for tasks assessed  General Comments General comments (skin integrity, edema, etc.): VSS on RA    Exercises     Assessment/Plan    PT Assessment Patient does not need any further PT services  PT Problem List         PT Treatment Interventions      PT Goals (Current goals can be found in the Care Plan section)  Acute Rehab PT Goals PT Goal Formulation: All assessment and education complete, DC therapy    Frequency       Co-evaluation               AM-PAC PT "6 Clicks" Mobility  Outcome Measure Help needed turning from your back to your side while in a flat bed without using bedrails?: None Help needed moving from lying on your back to sitting on the side of a flat bed without using bedrails?:  None Help needed moving to and from a bed to a chair (including a wheelchair)?: None Help needed standing up from a chair using your arms (e.g., wheelchair or bedside chair)?: None Help needed to walk in hospital room?: None Help needed climbing 3-5 steps with a railing? : None 6 Click Score: 24    End of Session   Activity Tolerance: Patient tolerated treatment well Patient left: in bed;with call bell/phone within reach   PT Visit Diagnosis: Other abnormalities of gait and mobility (R26.89)    Time: 7989-2119 PT Time Calculation (min) (ACUTE ONLY): 12 min   Charges:   PT Evaluation $PT Eval Low Complexity: [facility] [place] 09/19/2022, 5:20 PM

## 2022-09-19 NOTE — Progress Notes (Signed)
Kept  disconnecting cardiac monitor when he gets out of bed. Instructed not to take it out since he is being monitored.

## 2022-09-19 NOTE — Progress Notes (Signed)
Provided with TEDS which claimed to use it later.

## 2022-09-19 NOTE — Progress Notes (Signed)
PROGRESS NOTE                                                                                                                                                                                                             Patient Demographics:    Timothy [name], is a 52 y.o. male, DOB - 1971/06/18, GYF:[id number]  Outpatient Primary MD for the patient is Redmond School, MD    LOS - 2  Admit date - 09/17/2022    Chief Complaint  Patient presents with   GI Bleeding       Brief Narrative (HPI from H&P)    52 y.o. male with medical history significant of Barrett's esophagus, GERD, alcoholic gastritis and PUD with gastric ulcers, HTN, tobacco abuse, prior history of Wolff-Parkinson-White syndrome s/p ablation, fatty liver disease, chronic combined CHF, alcohol abuse,prior history of coronary disease (50% stenosis small diagonal) who presents to ED BIB EMS for 3 episodes of bloody emesis on the day of admission, he was having some upper GI discomfort for 3 to 4 days.  He also takes a daily aspirin and frequently uses BC powders.  In the past he has had a EGD showing some gastritis with some gastric ulcers.  He was admitted for upper GI bleed.   Subjective:   Patient in bed, appears comfortable, denies any headache, no fever, no chest pain or pressure, no shortness of breath , no abdominal pain. No new focal weakness.  Neck aches and pains wants more narcotics.  Says his feet are swollen.  Does not feel like he can go home today.   Assessment  & Plan :    Acute upper GI bleed likely due to gastric ulcers in a patient with ongoing alcohol abuse and frequent use of NSAIDs. he has been strictly counseled to quit alcohol and NSAID use, offending medications including aspirin are held, he was seen by GI underwent EGD showing severe esophagitis with possible recent bleeding, gastritis.  On PPI twice daily for [redacted] weeks along with Carafate daily for  2 weeks, H&H stable no need for transfusion.  Advance diet.  Needs follow-up with his primary care gastroenterologist Dr. Gala Romney in [place], this has been communicated to Dr. Gala Romney.   Smoking and alcohol abuse - inconsistent in his history.  Placed on Librium and CIWA protocol, counseled to quit.  CAD.  No acute issue.  For now aspirin on hold, symptom-free will monitor.  Will try and keep him globin over 8.  Continue beta-blocker and monitor.  Hypertension.  Blood pressure improving continue Catapres and low-dose beta-blocker, gentle Lasix and Aldactone on 09/19/2022.  Fatty liver.  Outpatient follow-up with PCP and GI.  Chronic combined systolic and diastolic CHF EF around 07% on last echocardiogram.  Will edema started on low-dose Lasix and Aldactone, continue beta-blocker, blood pressure too low for ACE/ARB.  Severe hypokalemia and hypomagnesemia.  Replaced.       Condition - Extremely Guarded  Family Communication  :  None  Code Status :  Full  Consults  :  GI  PUD Prophylaxis : PPI   Procedures  :      EGD -  Impression:LA Grade D reflux esophagitis with ulceration, recently bleeding. Gastritis. Biopsied. No esophageal or gastric varices.   Recommendation:           - Return patient to hospital ward for ongoing care.                           - Advance diet as tolerated.                           - Continue present medications including PPI BID                            for at least 8 weeks.                           - Add Carafate 1g QID x 14 days.                           - Reflux lifestyle modifications including keeping                            the head of the bed elevated.                           - Avoid all NSAIDs.                           - Continues serial hgb/hct with transfusion as                            indicated.                           - Await pathology results.                           - Repeat upper endoscopy in 8 weeks with Dr. Gala Romney                             to check healing and reassess for concurrent                            diagnoses.                           -  Recommend concurrent colonoscopy at time of EGD                            given his history of tubular adenomas on                            colonoscopy 2014.  Liver ultrasound.  Fatty liver.      Disposition Plan  :    Status is: Inpatient  DVT Prophylaxis  :    Place TED hose Start: 09/19/22 0727 SCDs Start: 09/18/22 0113    Lab Results  Component Value Date   PLT 160 09/19/2022    Diet :  Diet Order             DIET SOFT Fluid consistency: Thin  Diet effective now                    Inpatient Medications  Scheduled Meds:  carvedilol  3.125 mg Oral BID WC   chlordiazePOXIDE  15 mg Oral TID   [START ON 09/21/2022] cloNIDine  0.2 mg Transdermal Q Sun   folic acid  1 mg Oral Daily   furosemide  20 mg Intravenous Once   multivitamin with minerals  1 tablet Oral Daily   nicotine  21 mg Transdermal Daily   [START ON 09/21/2022] pantoprazole  40 mg Intravenous Q12H   spironolactone  25 mg Oral Daily   sucralfate  1 g Oral TID WC & HS   thiamine  100 mg Oral Daily   Or   thiamine  100 mg Intravenous Daily   Continuous Infusions:  promethazine (PHENERGAN) injection (IM or IVPB)     PRN Meds:.albuterol, fentaNYL (SUBLIMAZE) injection, hydrALAZINE, LORazepam, ondansetron **OR** ondansetron (ZOFRAN) IV, mouth rinse, oxyCODONE, promethazine (PHENERGAN) injection (IM or IVPB)  Antibiotics  :    Anti-infectives (From admission, onward)    None         Objective:   Vitals:   09/18/22 2315 09/19/22 0300 09/19/22 0630 09/19/22 0700  BP: (!) 133/99 (!) 135/99 (!) 125/90 (!) 121/92  Pulse: 81 79 76 72  Resp: '20  16 17  '$ Temp:   98.6 F (37 C) 98.6 F (37 C)  TempSrc:   Oral Oral  SpO2: 96%   93%  Weight:      Height:        Wt Readings from Last 3 Encounters:  09/18/22 104.3 kg  09/24/21 99.8 kg  08/27/21 108.9 kg      Intake/Output Summary (Last 24 hours) at 09/19/2022 0820 Last data filed at 09/19/2022 0800 Gross per 24 hour  Intake 2033.34 ml  Output 1451 ml  Net 582.34 ml     Physical Exam  Awake Alert, No new F.N deficits, Normal affect [place].AT,PERRAL Supple Neck, No JVD,   Symmetrical Chest wall movement, Good air movement bilaterally, CTAB RRR,No Gallops, Rubs or new Murmurs,  +ve B.Sounds, Abd Soft, No tenderness,   Trace edema    Data Review:    Recent Labs  Lab 09/17/22 2018 09/17/22 2348 09/18/22 0300 09/18/22 1021 09/18/22 1224 09/18/22 1903 09/19/22 0046  WBC 10.4  --  7.3  --  5.6 4.6 6.4  HGB 14.2   < > 11.3* 11.9* 12.1* 12.3* 12.9*  HCT 40.2   < > 32.6* 35.0* 35.0* 36.5* 37.7*  PLT 207  --  145*  --  133* 142* 160  MCV 102.6*  --  103.5*  --  104.8* 105.2* 103.3*  MCH 36.2*  --  35.9*  --  36.2* 35.4* 35.3*  MCHC 35.3  --  34.7  --  34.6 33.7 34.2  RDW 15.1  --  15.2  --  15.3 15.1 14.7  LYMPHSABS 0.9  --   --   --   --   --  0.6*  MONOABS 0.7  --   --   --   --   --  0.3  EOSABS 0.0  --   --   --   --   --  0.0  BASOSABS 0.1  --   --   --   --   --  0.0   < > = values in this interval not displayed.    Recent Labs  Lab 09/17/22 2018 09/17/22 2127 09/18/22 0114 09/18/22 0300 09/18/22 0533 09/18/22 1021 09/18/22 1224 09/18/22 1903 09/19/22 0046 09/19/22 0050  NA 136  --   --   --  136 137  --  130* 132*  --   K 2.3*  --   --   --  2.8* 3.8  --  3.7 3.7  --   CL 95*  --   --   --  99 101  --  98 97*  --   CO2 24  --   --   --  26  --   --  23 24  --   ANIONGAP 17*  --   --   --  11  --   --  9 11  --   GLUCOSE 115*  --   --   --  89 82  --  145* 108*  --   BUN 7  --   --   --  7 7  --  5* 8  --   CREATININE 1.42*  --   --   --  1.17 0.90  --  1.05 1.03  --   AST 80*  --   --   --  57*  --   --   --  62*  --   ALT 28  --   --   --  22  --   --   --  24  --   ALKPHOS 224*  --   --   --  177*  --   --   --  192*  --   BILITOT 1.9*  --   --   --  2.0*   --   --   --  2.3*  --   ALBUMIN 2.7*  --   --   --  2.2*  --   --   --  2.4*  --   INR 1.2  --   --  1.4*  --   --   --   --   --   --   HGBA1C  --   --   --   --   --   --  5.0  --   --   --   BNP  --  262.2* 570.7*  --   --   --   --   --   --  1,526.5*  MG  --  1.3*  --   --   --   --   --  2.1 2.1  --   CALCIUM 8.6*  --   --   --  7.4*  --   --  8.2* 8.6*  --      Lab Results  Component Value Date   HGBA1C 5.0 09/18/2022    Radiology Reports US Abdomen Complete  Result Date: 09/18/2022 CLINICAL DATA:  Elevated liver function tests EXAM: ABDOMEN ULTRASOUND COMPLETE COMPARISON:  CT 09/24/2021 FINDINGS: Gallbladder: Nondilated. Minimal wall thickening. Negative sonographic Murphy sign. Small amount of intraluminal sludge. No gallstones visualized. Common bile duct: Diameter: 3.8 mm, normal. Limited visualization. No intrahepatic ductal dilation. Liver: Diffusely increased liver echogenicity. Portal vein is patent on color Doppler imaging with normal direction of blood flow towards the liver. IVC: Not visualized. Pancreas: Not visualized. Spleen: Size and appearance within normal limits. Right Kidney: Length: 11.8 cm. No hydronephrosis. Echogenicity within normal limits. Left Kidney: Length: 10.8 cm. No hydronephrosis. Echogenicity within normal limits. Simple renal sinus cyst noted, as seen on prior CT. Abdominal aorta: Proximal and mid portions are poorly visualized. Measured distal portion 2.0 cm in AP diameter. Other findings: Limited exam due to body habitus and bowel gas. IMPRESSION: Diffuse hepatic steatosis. No evidence of cholecystitis or biliary obstruction. Electronically Signed   By: Maurine Simmering M.D.   On: 09/18/2022 10:27   DG Chest Port 1 View  Result Date: 09/17/2022 CLINICAL DATA:  Chest pain. EXAM: PORTABLE CHEST 1 VIEW COMPARISON:  September 24, 2021 FINDINGS: The cardiac silhouette is mildly enlarged. Low lung volumes are noted with mild atelectasis seen within the right lung  base. There is no evidence of a pleural effusion or pneumothorax. The visualized skeletal structures are unremarkable. IMPRESSION: Low lung volumes with mild right basilar atelectasis. Electronically Signed   By: Virgina Norfolk M.D.   On: 09/17/2022 20:16      Signature  -   Lala Lund M.D on 09/19/2022 at 8:20 AM   -  To page go to [email or web address]

## 2022-09-19 NOTE — Anesthesia Postprocedure Evaluation (Signed)
Anesthesia Post Note  Patient: Linton Ham Naramore  Procedure(s) Performed: ESOPHAGOGASTRODUODENOSCOPY (EGD) WITH PROPOFOL BIOPSY     Patient location during evaluation: PACU Anesthesia Type: General Level of consciousness: awake and alert Pain management: pain level controlled Vital Signs Assessment: post-procedure vital signs reviewed and stable Respiratory status: spontaneous breathing, nonlabored ventilation, respiratory function stable and patient connected to nasal cannula oxygen Cardiovascular status: blood pressure returned to baseline and stable Postop Assessment: no apparent nausea or vomiting Anesthetic complications: yes   Encounter Notable Events  Notable Event Outcome Phase Comment  Difficult to intubate - expected  Intraprocedure Filed from anesthesia note documentation.    Last Vitals:  Vitals:   09/19/22 1140 09/19/22 1241  BP: (!) 143/102 (!) 137/93  Pulse: 78 90  Resp:  13  Temp: 37.1 C 37.1 C  SpO2:  96%    Last Pain:  Vitals:   09/19/22 1241  TempSrc: Oral  PainSc:                  Tiajuana Amass

## 2022-09-20 DIAGNOSIS — K922 Gastrointestinal hemorrhage, unspecified: Secondary | ICD-10-CM | POA: Diagnosis not present

## 2022-09-20 LAB — CBC WITH DIFFERENTIAL/PLATELET
Abs Immature Granulocytes: 0.04 10*3/uL (ref 0.00–0.07)
Basophils Absolute: 0 10*3/uL (ref 0.0–0.1)
Basophils Relative: 0 %
Eosinophils Absolute: 0 10*3/uL (ref 0.0–0.5)
Eosinophils Relative: 0 %
HCT: 33.1 % — ABNORMAL LOW (ref 39.0–52.0)
Hemoglobin: 11.8 g/dL — ABNORMAL LOW (ref 13.0–17.0)
Immature Granulocytes: 0 %
Lymphocytes Relative: 15 %
Lymphs Abs: 1.4 10*3/uL (ref 0.7–4.0)
MCH: 36.2 pg — ABNORMAL HIGH (ref 26.0–34.0)
MCHC: 35.6 g/dL (ref 30.0–36.0)
MCV: 101.5 fL — ABNORMAL HIGH (ref 80.0–100.0)
Monocytes Absolute: 0.8 10*3/uL (ref 0.1–1.0)
Monocytes Relative: 9 %
Neutro Abs: 6.9 10*3/uL (ref 1.7–7.7)
Neutrophils Relative %: 76 %
Platelets: 155 10*3/uL (ref 150–400)
RBC: 3.26 MIL/uL — ABNORMAL LOW (ref 4.22–5.81)
RDW: 14.7 % (ref 11.5–15.5)
WBC: 9.1 10*3/uL (ref 4.0–10.5)
nRBC: 0 % (ref 0.0–0.2)

## 2022-09-20 LAB — COMPREHENSIVE METABOLIC PANEL
ALT: 23 U/L (ref 0–44)
AST: 60 U/L — ABNORMAL HIGH (ref 15–41)
Albumin: 2.3 g/dL — ABNORMAL LOW (ref 3.5–5.0)
Alkaline Phosphatase: 174 U/L — ABNORMAL HIGH (ref 38–126)
Anion gap: 7 (ref 5–15)
BUN: 9 mg/dL (ref 6–20)
CO2: 25 mmol/L (ref 22–32)
Calcium: 8.4 mg/dL — ABNORMAL LOW (ref 8.9–10.3)
Chloride: 98 mmol/L (ref 98–111)
Creatinine, Ser: 1.27 mg/dL — ABNORMAL HIGH (ref 0.61–1.24)
GFR, Estimated: >60 mL/min (ref >60)
Glucose, Bld: 83 mg/dL (ref 70–99)
Potassium: 3.1 mmol/L — ABNORMAL LOW (ref 3.5–5.1)
Sodium: 130 mmol/L — ABNORMAL LOW (ref 135–145)
Total Bilirubin: 1.2 mg/dL (ref 0.3–1.2)
Total Protein: 7 g/dL (ref 6.5–8.1)

## 2022-09-20 LAB — GLUCOSE, CAPILLARY: Glucose-Capillary: 85 mg/dL (ref 70–99)

## 2022-09-20 LAB — OSMOLALITY, URINE: Osmolality, Ur: 132 mOsm/kg — ABNORMAL LOW (ref 300–900)

## 2022-09-20 LAB — MAGNESIUM: Magnesium: 1.8 mg/dL (ref 1.7–2.4)

## 2022-09-20 LAB — SODIUM, URINE, RANDOM: Sodium, Ur: <10 mmol/L

## 2022-09-20 LAB — OSMOLALITY: Osmolality: 284 mOsm/kg (ref 275–295)

## 2022-09-20 LAB — CREATININE, URINE, RANDOM: Creatinine, Urine: 51 mg/dL

## 2022-09-20 LAB — URIC ACID: Uric Acid, Serum: 6.9 mg/dL (ref 3.7–8.6)

## 2022-09-20 LAB — BRAIN NATRIURETIC PEPTIDE: B Natriuretic Peptide: 1541.4 pg/mL — ABNORMAL HIGH (ref 0.0–100.0)

## 2022-09-20 MED ORDER — CARVEDILOL 3.125 MG PO TABS
3.1250 mg | ORAL_TABLET | Freq: Two times a day (BID) | ORAL | Status: DC
  Administered 2022-09-21: 3.125 mg via ORAL
  Filled 2022-09-20: qty 1

## 2022-09-20 MED ORDER — LACTATED RINGERS IV SOLN: INTRAVENOUS | Status: AC

## 2022-09-20 MED ORDER — POTASSIUM CHLORIDE CRYS ER 20 MEQ PO TBCR
40.0000 meq | EXTENDED_RELEASE_TABLET | Freq: Two times a day (BID) | ORAL | Status: AC
  Administered 2022-09-20 (×2): 40 meq via ORAL
  Filled 2022-09-20 (×2): qty 2

## 2022-09-20 NOTE — Progress Notes (Signed)
PROGRESS NOTE                                                                                                                                                                                                             Patient Demographics:    Timothy [name], is a 52 y.o. male, DOB - 12-14-1970, JHE:[id number]  Outpatient Primary MD for the patient is Redmond School, MD    LOS - 3  Admit date - 09/17/2022    Chief Complaint  Patient presents with   GI Bleeding       Brief Narrative (HPI from H&P)    52 y.o. male with medical history significant of Barrett's esophagus, GERD, alcoholic gastritis and PUD with gastric ulcers, HTN, tobacco abuse, prior history of Wolff-Parkinson-White syndrome s/p ablation, fatty liver disease, chronic combined CHF, alcohol abuse,prior history of coronary disease (50% stenosis small diagonal) who presents to ED BIB EMS for 3 episodes of bloody emesis on the day of admission, he was having some upper GI discomfort for 3 to 4 days.  He also takes a daily aspirin and frequently uses BC powders.  In the past he has had a EGD showing some gastritis with some gastric ulcers.  He was admitted for upper GI bleed.   Subjective:   Patient in bed, appears comfortable, denies any headache, no fever, no chest pain or pressure, no shortness of breath , no abdominal pain. No focal weakness.   Assessment  & Plan :    Acute upper GI bleed likely due to gastric ulcers in a patient with ongoing alcohol abuse and frequent use of NSAIDs. he has been strictly counseled to quit alcohol and NSAID use, offending medications including aspirin are held, he was seen by GI underwent EGD showing severe esophagitis with possible recent bleeding, gastritis.  On PPI twice daily for [redacted] weeks along with Carafate daily for 2 weeks, H&H stable no need for transfusion.  Advance diet.  Needs follow-up with his primary care gastroenterologist  Dr. Gala Romney in [place], this has been communicated to Dr. Gala Romney.   Smoking and alcohol abuse - inconsistent in his history.  Placed on Librium and CIWA protocol, counseled to quit.  CAD.  No acute issue.  For now aspirin on hold, symptom-free will monitor.  Will try and keep him globin over 8.  Continue beta-blocker  and monitor.  Hypertension.  Blood pressure improving, stop Catapres dose, skip today's Coreg as blood pressure is soft on 09/20/2022.  Monitor.  Fatty liver.  Outpatient follow-up with PCP and GI.  Chronic combined systolic and diastolic CHF EF around 16% on last echocardiogram.  Will edema started on low-dose Lasix and Aldactone, continue beta-blocker, blood pressure too low for ACE/ARB.  Severe hypokalemia and hypomagnesemia.  Replaced.  AKI.  Mildly dehydrated now.  Hold further diuretics, gently hydrate, hold blood pressure medications on 09/20/2022, urine electrolytes, bladder scans and monitor.       Condition - Extremely Guarded  Family Communication  :  None  Code Status :  Full  Consults  :  GI  PUD Prophylaxis : PPI   Procedures  :      EGD -  Impression:LA Grade D reflux esophagitis with ulceration, recently bleeding. Gastritis. Biopsied. No esophageal or gastric varices.   Recommendation:           - Return patient to hospital ward for ongoing care.                           - Advance diet as tolerated.                           - Continue present medications including PPI BID                            for at least 8 weeks.                           - Add Carafate 1g QID x 14 days.                           - Reflux lifestyle modifications including keeping                            the head of the bed elevated.                           - Avoid all NSAIDs.                           - Continues serial hgb/hct with transfusion as                            indicated.                           - Await pathology results.                           -  Repeat upper endoscopy in 8 weeks with Dr. Gala Romney                            to check healing and reassess for concurrent                            diagnoses.                           -  Recommend concurrent colonoscopy at time of EGD                            given his history of tubular adenomas on                            colonoscopy 2014.  Liver ultrasound.  Fatty liver.      Disposition Plan  :    Status is: Inpatient  DVT Prophylaxis  :    Place TED hose Start: 09/19/22 0727 SCDs Start: 09/18/22 0113    Lab Results  Component Value Date   PLT 155 09/20/2022    Diet :  Diet Order             DIET SOFT Fluid consistency: Thin  Diet effective now                    Inpatient Medications  Scheduled Meds:  [START ON 09/21/2022] carvedilol  3.125 mg Oral BID WC   chlordiazePOXIDE  15 mg Oral TID   folic acid  1 mg Oral Daily   multivitamin with minerals  1 tablet Oral Daily   nicotine  21 mg Transdermal Daily   [START ON 09/21/2022] pantoprazole  40 mg Intravenous Q12H   potassium chloride  40 mEq Oral BID   spironolactone  25 mg Oral Daily   sucralfate  1 g Oral TID WC & HS   thiamine  100 mg Oral Daily   Or   thiamine  100 mg Intravenous Daily   Continuous Infusions:  lactated ringers     promethazine (PHENERGAN) injection (IM or IVPB)     PRN Meds:.albuterol, fentaNYL (SUBLIMAZE) injection, hydrALAZINE, LORazepam, nicotine polacrilex, ondansetron **OR** ondansetron (ZOFRAN) IV, mouth rinse, oxyCODONE, promethazine (PHENERGAN) injection (IM or IVPB)  Antibiotics  :    Anti-infectives (From admission, onward)    None         Objective:   Vitals:   09/20/22 0230 09/20/22 0400 09/20/22 0631 09/20/22 0740  BP:  94/67 99/69 121/81  Pulse:  70 73 70  Resp:   18 15  Temp:   97.8 F (36.6 C) 98.4 F (36.9 C)  TempSrc:   Oral Oral  SpO2:   93% 93%  Weight: 108.1 kg     Height:        Wt Readings from Last 3 Encounters:  09/20/22 108.1 kg   09/24/21 99.8 kg  08/27/21 108.9 kg     Intake/Output Summary (Last 24 hours) at 09/20/2022 1022 Last data filed at 09/20/2022 0631 Gross per 24 hour  Intake --  Output 2750 ml  Net -2750 ml     Physical Exam  Awake Alert, No new F.N deficits, Normal affect [place].AT,PERRAL Supple Neck, No JVD,   Symmetrical Chest wall movement, Good air movement bilaterally, CTAB RRR,No Gallops, Rubs or new Murmurs,  +ve B.Sounds, Abd Soft, No tenderness,   No edema    Data Review:    Recent Labs  Lab 09/17/22 2018 09/17/22 2348 09/18/22 0300 09/18/22 1021 09/18/22 1224 09/18/22 1903 09/19/22 0046 09/20/22 0030  WBC 10.4  --  7.3  --  5.6 4.6 6.4 9.1  HGB 14.2   < > 11.3* 11.9* 12.1* 12.3* 12.9* 11.8*  HCT 40.2   < > 32.6* 35.0* 35.0* 36.5* 37.7* 33.1*  PLT 207  --  145*  --  133* 142* 160  155  MCV 102.6*  --  103.5*  --  104.8* 105.2* 103.3* 101.5*  MCH 36.2*  --  35.9*  --  36.2* 35.4* 35.3* 36.2*  MCHC 35.3  --  34.7  --  34.6 33.7 34.2 35.6  RDW 15.1  --  15.2  --  15.3 15.1 14.7 14.7  LYMPHSABS 0.9  --   --   --   --   --  0.6* 1.4  MONOABS 0.7  --   --   --   --   --  0.3 0.8  EOSABS 0.0  --   --   --   --   --  0.0 0.0  BASOSABS 0.1  --   --   --   --   --  0.0 0.0   < > = values in this interval not displayed.    Recent Labs  Lab 09/17/22 2018 09/17/22 2127 09/18/22 0114 09/18/22 0300 09/18/22 0533 09/18/22 1021 09/18/22 1224 09/18/22 1903 09/19/22 0046 09/19/22 0050 09/20/22 0030  NA 136  --   --   --  136 137  --  130* 132*  --  130*  K 2.3*  --   --   --  2.8* 3.8  --  3.7 3.7  --  3.1*  CL 95*  --   --   --  99 101  --  98 97*  --  98  CO2 24  --   --   --  26  --   --  23 24  --  25  ANIONGAP 17*  --   --   --  11  --   --  9 11  --  7  GLUCOSE 115*  --   --   --  89 82  --  145* 108*  --  83  BUN 7  --   --   --  7 7  --  5* 8  --  9  CREATININE 1.42*  --   --   --  1.17 0.90  --  1.05 1.03  --  1.27*  AST 80*  --   --   --  57*  --   --   --  62*  --   60*  ALT 28  --   --   --  22  --   --   --  24  --  23  ALKPHOS 224*  --   --   --  177*  --   --   --  192*  --  174*  BILITOT 1.9*  --   --   --  2.0*  --   --   --  2.3*  --  1.2  ALBUMIN 2.7*  --   --   --  2.2*  --   --   --  2.4*  --  2.3*  INR 1.2  --   --  1.4*  --   --   --   --   --   --   --   HGBA1C  --   --   --   --   --   --  5.0  --   --   --   --   BNP  --  262.2* 570.7*  --   --   --   --   --   --  1,526.5* 1,541.4*  MG  --  1.3*  --   --   --   --   --  2.1 2.1  --  1.8  CALCIUM 8.6*  --   --   --  7.4*  --   --  8.2* 8.6*  --  8.4*     Lab Results  Component Value Date   HGBA1C 5.0 09/18/2022    Radiology Reports US Abdomen Complete  Result Date: 09/18/2022 CLINICAL DATA:  Elevated liver function tests EXAM: ABDOMEN ULTRASOUND COMPLETE COMPARISON:  CT 09/24/2021 FINDINGS: Gallbladder: Nondilated. Minimal wall thickening. Negative sonographic Murphy sign. Small amount of intraluminal sludge. No gallstones visualized. Common bile duct: Diameter: 3.8 mm, normal. Limited visualization. No intrahepatic ductal dilation. Liver: Diffusely increased liver echogenicity. Portal vein is patent on color Doppler imaging with normal direction of blood flow towards the liver. IVC: Not visualized. Pancreas: Not visualized. Spleen: Size and appearance within normal limits. Right Kidney: Length: 11.8 cm. No hydronephrosis. Echogenicity within normal limits. Left Kidney: Length: 10.8 cm. No hydronephrosis. Echogenicity within normal limits. Simple renal sinus cyst noted, as seen on prior CT. Abdominal aorta: Proximal and mid portions are poorly visualized. Measured distal portion 2.0 cm in AP diameter. Other findings: Limited exam due to body habitus and bowel gas. IMPRESSION: Diffuse hepatic steatosis. No evidence of cholecystitis or biliary obstruction. Electronically Signed   By: Maurine Simmering M.D.   On: 09/18/2022 10:27   DG Chest Port 1 View  Result Date: 09/17/2022 CLINICAL DATA:  Chest  pain. EXAM: PORTABLE CHEST 1 VIEW COMPARISON:  September 24, 2021 FINDINGS: The cardiac silhouette is mildly enlarged. Low lung volumes are noted with mild atelectasis seen within the right lung base. There is no evidence of a pleural effusion or pneumothorax. The visualized skeletal structures are unremarkable. IMPRESSION: Low lung volumes with mild right basilar atelectasis. Electronically Signed   By: Virgina Norfolk M.D.   On: 09/17/2022 20:16      Signature  -   Lala Lund M.D on 09/20/2022 at 10:22 AM   -  To page go to [email or web address]

## 2022-09-20 NOTE — TOC Progression Note (Signed)
Transition of Care [facility]) - Progression Note    Patient Details  Name: SAYER MASINI MRN: [id number] Date of Birth: [birth year]  Transition of [facility] CM/SW Wann, RN Phone Number: 09/20/2022, 9:08 AM  Clinical Narrative:     4 wheeled walker ordered from [employer]. No further needs identified at this [facility]       Expected Discharge Plan and Services                         DME Arranged: Walker rolling with seat DME Agency: [employer] Date DME Agency Contacted: 09/20/22 Time DME Agency Contacted: [phone]               Social Determinants of Health (SDOH) Interventions SDOH Screenings   Food Insecurity: No Food Insecurity (09/18/2022)  Housing: Low Risk  (09/18/2022)  Transportation Needs: No Transportation Needs (09/18/2022)  Utilities: Not At Risk (09/18/2022)  Tobacco Use: High Risk (09/18/2022)    Readmission Risk Interventions     No data to display

## 2022-09-20 NOTE — Progress Notes (Signed)
Bladder scan done obtained 4 ml.

## 2022-09-20 NOTE — Progress Notes (Signed)
Mobility Specialist: Progress Note   09/20/22 1613  Mobility  Activity Ambulated with assistance in hallway  Level of Assistance Minimal assist, patient does 75% or more  Assistive Device Four wheel walker  Distance Ambulated (ft) 1500 ft  Activity Response Tolerated well  Mobility Referral Yes  $Mobility charge 1 Mobility   Post-Mobility: 84 HR, 95% SpO2  Pt received sitting EOB and agreeable to mobility. MinA to stand and standby assist during ambulation. No c/o throughout. Pt sitting EOB after session with call bell in reach. Family present in the room.   [name] Mobility Specialist Please contact via SecureChat or Rehab office at [phone]

## 2022-09-21 ENCOUNTER — Encounter (HOSPITAL_COMMUNITY): Payer: Self-pay | Admitting: Gastroenterology

## 2022-09-21 DIAGNOSIS — K920 Hematemesis: Secondary | ICD-10-CM | POA: Diagnosis not present

## 2022-09-21 LAB — CBC WITH DIFFERENTIAL/PLATELET
Abs Immature Granulocytes: 0.03 10*3/uL (ref 0.00–0.07)
Basophils Absolute: 0 10*3/uL (ref 0.0–0.1)
Basophils Relative: 1 %
Eosinophils Absolute: 0.1 10*3/uL (ref 0.0–0.5)
Eosinophils Relative: 1 %
HCT: 40 % (ref 39.0–52.0)
Hemoglobin: 13.5 g/dL (ref 13.0–17.0)
Immature Granulocytes: 0 %
Lymphocytes Relative: 27 %
Lymphs Abs: 1.8 10*3/uL (ref 0.7–4.0)
MCH: 35.6 pg — ABNORMAL HIGH (ref 26.0–34.0)
MCHC: 33.8 g/dL (ref 30.0–36.0)
MCV: 105.5 fL — ABNORMAL HIGH (ref 80.0–100.0)
Monocytes Absolute: 1 10*3/uL (ref 0.1–1.0)
Monocytes Relative: 15 %
Neutro Abs: 3.8 10*3/uL (ref 1.7–7.7)
Neutrophils Relative %: 56 %
Platelets: 174 10*3/uL (ref 150–400)
RBC: 3.79 MIL/uL — ABNORMAL LOW (ref 4.22–5.81)
RDW: 15 % (ref 11.5–15.5)
WBC: 6.8 10*3/uL (ref 4.0–10.5)
nRBC: 0 % (ref 0.0–0.2)

## 2022-09-21 LAB — COMPREHENSIVE METABOLIC PANEL
ALT: 34 U/L (ref 0–44)
AST: 91 U/L — ABNORMAL HIGH (ref 15–41)
Albumin: 2.7 g/dL — ABNORMAL LOW (ref 3.5–5.0)
Alkaline Phosphatase: 187 U/L — ABNORMAL HIGH (ref 38–126)
Anion gap: 7 (ref 5–15)
BUN: 9 mg/dL (ref 6–20)
CO2: 27 mmol/L (ref 22–32)
Calcium: 8.9 mg/dL (ref 8.9–10.3)
Chloride: 98 mmol/L (ref 98–111)
Creatinine, Ser: 1.28 mg/dL — ABNORMAL HIGH (ref 0.61–1.24)
GFR, Estimated: >60 mL/min (ref >60)
Glucose, Bld: 89 mg/dL (ref 70–99)
Potassium: 3.7 mmol/L (ref 3.5–5.1)
Sodium: 132 mmol/L — ABNORMAL LOW (ref 135–145)
Total Bilirubin: 1.3 mg/dL — ABNORMAL HIGH (ref 0.3–1.2)
Total Protein: 7.5 g/dL (ref 6.5–8.1)

## 2022-09-21 LAB — BRAIN NATRIURETIC PEPTIDE: B Natriuretic Peptide: 880.5 pg/mL — ABNORMAL HIGH (ref 0.0–100.0)

## 2022-09-21 LAB — MAGNESIUM: Magnesium: 1.8 mg/dL (ref 1.7–2.4)

## 2022-09-21 MED ORDER — NICOTINE 21 MG/24HR TD PT24: 21.0000 mg | MEDICATED_PATCH | Freq: Every day | TRANSDERMAL | 0 refills | Status: DC

## 2022-09-21 MED ORDER — VITAMIN B-1 100 MG PO TABS: 100.0000 mg | ORAL_TABLET | Freq: Every day | ORAL | 0 refills | Status: AC

## 2022-09-21 MED ORDER — FUROSEMIDE 40 MG PO TABS: 40.0000 mg | ORAL_TABLET | Freq: Every day | ORAL | 0 refills | Status: DC

## 2022-09-21 MED ORDER — CARVEDILOL 3.125 MG PO TABS: 3.1250 mg | ORAL_TABLET | Freq: Two times a day (BID) | ORAL | 0 refills | Status: DC

## 2022-09-21 MED ORDER — CHLORDIAZEPOXIDE HCL 5 MG PO CAPS: ORAL_CAPSULE | ORAL | 0 refills | Status: DC

## 2022-09-21 MED ORDER — CHLORDIAZEPOXIDE HCL 5 MG PO CAPS
5.0000 mg | ORAL_CAPSULE | Freq: Two times a day (BID) | ORAL | Status: DC
  Administered 2022-09-21: 5 mg via ORAL
  Filled 2022-09-21: qty 1

## 2022-09-21 NOTE — Progress Notes (Signed)
Order to discharge pt home.  Discharge instructions/AVS given to patient and reviewed - education provided as needed.  Pt advised to call PCP and/or come back to the hospital if there are any problems. Pt verbalized understanding.    

## 2022-09-21 NOTE — Discharge Summary (Signed)
Timothy [name] VXY:[id number] DOB: [birth year] DOA: 09/17/2022  PCP: Redmond School, MD  Admit date: 09/17/2022  Discharge date: 09/21/2022  Admitted From: Home   Disposition:  Home   Recommendations for Outpatient Follow-up:   Follow up with PCP in 1-2 weeks  PCP Please obtain BMP/CBC, 2 view CXR in 1week,  (see Discharge instructions)   PCP Please follow up on the following pending results:    Home Health: PT, OT if qualifies   Equipment/Devices: as below  Consultations: None  Discharge Condition: Stable    CODE STATUS: Full    Diet Recommendation: Heart Healthy     Chief Complaint  Patient presents with   GI Bleeding     Brief history of present illness from the day of admission and additional interim summary    52 y.o. male with medical history significant of Barrett's esophagus, GERD, alcoholic gastritis and PUD with gastric ulcers, HTN, tobacco abuse, prior history of Wolff-Parkinson-White syndrome s/p ablation, fatty liver disease, chronic combined CHF, alcohol abuse,prior history of coronary disease (50% stenosis small diagonal) who presents to ED BIB EMS for 3 episodes of bloody emesis on the day of admission, he was having some upper GI discomfort for 3 to 4 days.  He also takes a daily aspirin and frequently uses BC powders.  In the past he has had a EGD showing some gastritis with some gastric ulcers.  He was admitted for upper GI bleed.                                                                  Hospital Course   Acute upper GI bleed likely due to gastric ulcers in a patient with ongoing alcohol abuse and frequent use of NSAIDs. he has been strictly counseled to quit alcohol and NSAID use, offending medications including aspirin are held, he was seen by GI underwent EGD showing severe esophagitis  with possible recent bleeding, gastritis.  On PPI twice daily for [redacted] weeks along with Carafate daily for 2 weeks, H&H stable no need for transfusion.  Advance diet.  Needs follow-up with his primary care gastroenterologist Dr. Gala Romney in [place], this has been communicated to Dr. Gala Romney.   Smoking and alcohol abuse - inconsistent in his history stable on Librium, currently no DTs will be discharged on short Librium taper.  Strictly counseled to quit both smoking and alcohol.   CAD.  No acute issue.  For now aspirin on hold, symptom-free will monitor.  Will try and keep him globin over 8.  Continue beta-blocker and monitor.   Hypertension.  Blood pressure stable only on low-dose Coreg for which he will be discharged along with low-dose diuretic.  Fatty liver.  Outpatient follow-up with PCP and GI.   Chronic combined systolic and diastolic CHF EF around 82%  on last echocardiogram.  Currently compensated diuretic dose lowered upon discharge, continue low-dose beta-blocker, blood pressure too low for ACE/ARB.  PCP to monitor and adjust outpatient follow-up.   Severe hypokalemia and hypomagnesemia.  Replaced.   AKI.  Mildly dehydrated now.  Hold further diuretics, gently hydrated, blood pressure improved medications adjusted.  AKI has plateaued.  Will be discharged on adjusted blood pressure medications with close outpatient follow-up with PCP in 7 to 10 days.  Diuretic dose dropped.  PCP to monitor.  Discharge diagnosis     Principal Problem:   Hematemesis with nausea Active Problems:   Acute esophagitis    Discharge instructions    Discharge Instructions     Diet - low sodium heart healthy   Complete by: As directed    Discharge instructions   Complete by: As directed    Follow with Primary MD Redmond School, MD and your gastroenterologist in 7 days   Get CBC, CMP, 2 view Chest X ray -  checked next visit with your primary MD   Activity: As tolerated with Full fall precautions use  walker/cane & assistance as needed  Disposition Home    Diet: Heart Healthy   Special Instructions: If you have smoked or chewed Tobacco  in the last 2 yrs please stop smoking, stop any regular Alcohol  and or any Recreational drug use.  On your next visit with your primary care physician please Get Medicines reviewed and adjusted.  Please request your Prim.MD to go over all Hospital Tests and Procedure/Radiological results at the follow up, please get all Hospital records sent to your Prim MD by signing hospital release before you go home.  If you experience worsening of your admission symptoms, develop shortness of breath, life threatening emergency, suicidal or homicidal thoughts you must seek medical attention immediately by calling 911 or calling your MD immediately  if symptoms less severe.  You Must read complete instructions/literature along with all the possible adverse reactions/side effects for all the Medicines you take and that have been prescribed to you. Take any new Medicines after you have completely understood and accpet all the possible adverse reactions/side effects.   Increase activity slowly   Complete by: As directed        Discharge Medications   Allergies as of 09/21/2022       Reactions   Morphine Other (See Comments)   Makes overly sleepy        Medication List     STOP taking these medications    cloNIDine 0.2 MG tablet Commonly known as: CATAPRES   cloNIDine 0.3 mg/24hr patch Commonly known as: CATAPRES - Dosed in mg/24 hr   losartan 25 MG tablet Commonly known as: COZAAR       TAKE these medications    albuterol 108 (90 Base) MCG/ACT inhaler Commonly known as: VENTOLIN HFA Inhale 2 puffs into the lungs every 6 (six) hours as needed for wheezing or shortness of breath.   albuterol (2.5 MG/3ML) 0.083% nebulizer solution Commonly known as: PROVENTIL Take 2.5 mg by nebulization every 4 (four) hours as needed.   carvedilol 3.125 MG  tablet Commonly known as: COREG Take 1 tablet (3.125 mg total) by mouth 2 (two) times daily with a meal. What changed:  medication strength how much to take   chlordiazePOXIDE 5 MG capsule Commonly known as: LIBRIUM Take 1 pill twice a day for 2 days then 1 pill once a day for 2 days and stop   famotidine 20 MG  tablet Commonly known as: PEPCID Take 20 mg by mouth daily.   folic acid 1 MG tablet Commonly known as: FOLVITE Take 1 tablet (1 mg total) by mouth daily.   furosemide 40 MG tablet Commonly known as: LASIX Take 1 tablet (40 mg total) by mouth daily. What changed: when to take this   hydrOXYzine 25 MG tablet Commonly known as: ATARAX Take 1 tablet (25 mg total) by mouth 3 (three) times daily as needed for anxiety or nausea.   multivitamin with minerals Tabs tablet Take 1 tablet by mouth daily.   nicotine 21 mg/24hr patch Commonly known as: NICODERM CQ - dosed in mg/24 hours Place 1 patch (21 mg total) onto the skin daily.   nystatin cream Commonly known as: MYCOSTATIN Apply 1 Application topically daily as needed for dry skin.   oxyCODONE 15 MG immediate release tablet Commonly known as: ROXICODONE Take 15 mg by mouth every 6 (six) hours as needed for pain.   spironolactone 25 MG tablet Commonly known as: ALDACTONE Take 1 tablet (25 mg total) by mouth every morning. What changed:  when to take this reasons to take this   sucralfate 1 GM/10ML suspension Commonly known as: CARAFATE Take 1 g by mouth 2 (two) times daily.   thiamine 100 MG tablet Commonly known as: Vitamin B-1 Take 1 tablet (100 mg total) by mouth daily.   Vitamin D3 25 MCG (1000 UT) Caps Take 2 capsules by mouth every evening.               Durable Medical Equipment  (From admission, onward)           Start     Ordered   09/21/22 0721  For home use only DME Walker rolling  Once       Comments: 5 wheel  Question Answer Comment  Walker: With Floyd Wheels   Patient  needs a walker to treat with the following condition Weakness      09/21/22 0721   09/19/22 1647  For home use only DME 4 wheeled rolling walker with seat  Once       Question:  Patient needs a walker to treat with the following condition  Answer:  Abnormal gait   09/19/22 1647             Follow-up Information     Redmond School, MD. Schedule an appointment as soon as possible for a visit in 1 week(s).   Specialty: Internal Medicine Contact information: [address] [place] [address] [phone]         Daneil Dolin, MD. Schedule an appointment as soon as possible for a visit in 1 week(s).   Specialty: Gastroenterology Contact information: [address] [place] [address] [phone]                 Major procedures and Radiology Reports - PLEASE review detailed and final reports thoroughly  -       US Abdomen Complete  Result Date: 09/18/2022 CLINICAL DATA:  Elevated liver function tests EXAM: ABDOMEN ULTRASOUND COMPLETE COMPARISON:  CT 09/24/2021 FINDINGS: Gallbladder: Nondilated. Minimal wall thickening. Negative sonographic Murphy sign. Small amount of intraluminal sludge. No gallstones visualized. Common bile duct: Diameter: 3.8 mm, normal. Limited visualization. No intrahepatic ductal dilation. Liver: Diffusely increased liver echogenicity. Portal vein is patent on color Doppler imaging with normal direction of blood flow towards the liver. IVC: Not visualized. Pancreas: Not visualized. Spleen: Size and appearance within normal limits. Right  Kidney: Length: 11.8 cm. No hydronephrosis. Echogenicity within normal limits. Left Kidney: Length: 10.8 cm. No hydronephrosis. Echogenicity within normal limits. Simple renal sinus cyst noted, as seen on prior CT. Abdominal aorta: Proximal and mid portions are poorly visualized. Measured distal portion 2.0 cm in AP diameter. Other findings: Limited exam due to body habitus and bowel gas. IMPRESSION:  Diffuse hepatic steatosis. No evidence of cholecystitis or biliary obstruction. Electronically Signed   By: Maurine Simmering M.D.   On: 09/18/2022 10:27   DG Chest Port 1 View  Result Date: 09/17/2022 CLINICAL DATA:  Chest pain. EXAM: PORTABLE CHEST 1 VIEW COMPARISON:  September 24, 2021 FINDINGS: The cardiac silhouette is mildly enlarged. Low lung volumes are noted with mild atelectasis seen within the right lung base. There is no evidence of a pleural effusion or pneumothorax. The visualized skeletal structures are unremarkable. IMPRESSION: Low lung volumes with mild right basilar atelectasis. Electronically Signed   By: Virgina Norfolk M.D.   On: 09/17/2022 20:16       Today   Subjective    Timothy [name] today has no headache,no chest abdominal pain,no new weakness tingling or numbness, feels much better wants to go home today.     Objective   Blood pressure 109/82, pulse 77, temperature 97.7 F (36.5 C), temperature source Oral, resp. rate 17, height 6' (1.829 m), weight 108.1 kg, SpO2 93 %.   Intake/Output Summary (Last 24 hours) at 09/21/2022 0911 Last data filed at 09/21/2022 0716 Gross per 24 hour  Intake 900 ml  Output 1350 ml  Net -450 ml    Exam  Awake Alert, No new F.N deficits,    [place].AT,PERRAL Supple Neck,   Symmetrical Chest wall movement, Good air movement bilaterally, CTAB RRR,No Gallops,   +ve B.Sounds, Abd Soft, Non tender,  No Cyanosis, Clubbing or edema    Data Review   Recent Labs  Lab 09/17/22 2018 09/17/22 2348 09/18/22 1224 09/18/22 1903 09/19/22 0046 09/20/22 0030 09/21/22 0025  WBC 10.4   < > 5.6 4.6 6.4 9.1 6.8  HGB 14.2   < > 12.1* 12.3* 12.9* 11.8* 13.5  HCT 40.2   < > 35.0* 36.5* 37.7* 33.1* 40.0  PLT 207   < > 133* 142* 160 155 174  MCV 102.6*   < > 104.8* 105.2* 103.3* 101.5* 105.5*  MCH 36.2*   < > 36.2* 35.4* 35.3* 36.2* 35.6*  MCHC 35.3   < > 34.6 33.7 34.2 35.6 33.8  RDW 15.1   < > 15.3 15.1 14.7 14.7 15.0  LYMPHSABS 0.9  --   --    --  0.6* 1.4 1.8  MONOABS 0.7  --   --   --  0.3 0.8 1.0  EOSABS 0.0  --   --   --  0.0 0.0 0.1  BASOSABS 0.1  --   --   --  0.0 0.0 0.0   < > = values in this interval not displayed.    Recent Labs  Lab 09/17/22 2018 09/17/22 2127 09/18/22 0114 09/18/22 0300 09/18/22 0533 09/18/22 1021 09/18/22 1224 09/18/22 1903 09/19/22 0046 09/19/22 0050 09/20/22 0030 09/21/22 0025  NA 136  --   --   --  136 137  --  130* 132*  --  130* 132*  K 2.3*  --   --   --  2.8* 3.8  --  3.7 3.7  --  3.1* 3.7  CL 95*  --   --   --  99  101  --  98 97*  --  98 98  CO2 24  --   --   --  26  --   --  23 24  --  25 27  ANIONGAP 17*  --   --   --  11  --   --  9 11  --  7 7  GLUCOSE 115*  --   --   --  89 82  --  145* 108*  --  83 89  BUN 7  --   --   --  7 7  --  5* 8  --  9 9  CREATININE 1.42*  --   --   --  1.17 0.90  --  1.05 1.03  --  1.27* 1.28*  AST 80*  --   --   --  57*  --   --   --  62*  --  60* 91*  ALT 28  --   --   --  22  --   --   --  24  --  23 34  ALKPHOS 224*  --   --   --  177*  --   --   --  192*  --  174* 187*  BILITOT 1.9*  --   --   --  2.0*  --   --   --  2.3*  --  1.2 1.3*  ALBUMIN 2.7*  --   --   --  2.2*  --   --   --  2.4*  --  2.3* 2.7*  INR 1.2  --   --  1.4*  --   --   --   --   --   --   --   --   HGBA1C  --   --   --   --   --   --  5.0  --   --   --   --   --   BNP  --  262.2* 570.7*  --   --   --   --   --   --  1,526.5* 1,541.4* 880.5*  MG  --  1.3*  --   --   --   --   --  2.1 2.1  --  1.8 1.8  CALCIUM 8.6*  --   --   --  7.4*  --   --  8.2* 8.6*  --  8.4* 8.9     Total Time in preparing paper work, data evaluation and todays exam - 35 minutes  Signature  -    Lala Lund M.D on 09/21/2022 at 9:11 AM   -  To page go to [email or web address]

## 2022-09-22 LAB — UREA NITROGEN, URINE: Urea Nitrogen, Ur: 220 mg/dL

## 2022-09-22 NOTE — Telephone Encounter (Signed)
Left message for patient to call back  

## 2022-09-23 ENCOUNTER — Encounter: Payer: Self-pay | Admitting: Gastroenterology

## 2022-09-23 NOTE — Telephone Encounter (Signed)
Left message for patient to call back  

## 2022-09-23 NOTE — Telephone Encounter (Signed)
Patient has OV scheduled with Dr. Gala Romney.

## 2022-09-26 NOTE — Care Management Important Message (Signed)
Important Message  Patient Details  Name: Timothy [name] MRN: [id number] Date of Birth: 12-08-1970   Medicare Important Message Given:  Yes     Pheonix Clinkscale 09/26/2022, 2:03 PM

## 2022-09-29 DIAGNOSIS — I700 Atherosclerosis of aorta: Secondary | ICD-10-CM | POA: Diagnosis not present

## 2022-09-29 DIAGNOSIS — I10 Essential (primary) hypertension: Secondary | ICD-10-CM | POA: Diagnosis not present

## 2022-09-29 DIAGNOSIS — K219 Gastro-esophageal reflux disease without esophagitis: Secondary | ICD-10-CM | POA: Diagnosis not present

## 2022-09-29 DIAGNOSIS — I5042 Chronic combined systolic (congestive) and diastolic (congestive) heart failure: Secondary | ICD-10-CM | POA: Diagnosis not present

## 2022-09-29 DIAGNOSIS — K922 Gastrointestinal hemorrhage, unspecified: Secondary | ICD-10-CM | POA: Diagnosis not present

## 2022-10-10 DIAGNOSIS — I456 Pre-excitation syndrome: Secondary | ICD-10-CM | POA: Diagnosis not present

## 2022-10-10 DIAGNOSIS — I5042 Chronic combined systolic (congestive) and diastolic (congestive) heart failure: Secondary | ICD-10-CM | POA: Diagnosis not present

## 2022-10-10 DIAGNOSIS — I700 Atherosclerosis of aorta: Secondary | ICD-10-CM | POA: Diagnosis not present

## 2022-10-15 ENCOUNTER — Ambulatory Visit: Payer: 59 | Admitting: Cardiology

## 2022-10-15 ENCOUNTER — Encounter: Payer: Self-pay | Admitting: Cardiology

## 2022-10-15 VITALS — BP 135/86 | HR 96 | Ht 72.0 in | Wt 231.8 lb

## 2022-10-15 DIAGNOSIS — I5032 Chronic diastolic (congestive) heart failure: Secondary | ICD-10-CM | POA: Diagnosis not present

## 2022-10-15 DIAGNOSIS — Z720 Tobacco use: Secondary | ICD-10-CM

## 2022-10-15 DIAGNOSIS — K922 Gastrointestinal hemorrhage, unspecified: Secondary | ICD-10-CM | POA: Diagnosis not present

## 2022-10-15 DIAGNOSIS — T39395A Adverse effect of other nonsteroidal anti-inflammatory drugs [NSAID], initial encounter: Secondary | ICD-10-CM | POA: Diagnosis not present

## 2022-10-15 DIAGNOSIS — I10 Essential (primary) hypertension: Secondary | ICD-10-CM | POA: Diagnosis not present

## 2022-10-15 NOTE — Progress Notes (Signed)
Primary Physician/Referring:  Redmond School, MD  Patient ID: Timothy [name], male    DOB: Sep 29, 1970, 52 y.o.   MRN: [id number]  Chief Complaint  Patient presents with   Leg Swelling   HPI:    Timothy [name]  is a 52 y.o. 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 His last GI bleed was on 09/17/2022 related to severe esophagitis, and gastritis and recent bleed due to NSAID use and possibly contributed by alcohol use and smoking.  Patient presents to establish cardiac care, states that since hospital discharge she was markedly fluid overloaded, lost a lot of weight by taking a diuretic which she does not remember doing by his PCP for a week.  He has started to feel better, still has marked dyspnea and even minimal activity brings on dyspnea.  Presently thinking about 3 light beers a day, states that he does not drink any more than this.  Still smokes about 1.5 packs of cigarettes a day.  Has poor eating habits.  Past Medical History:  Diagnosis Date   Anxiety    Arthritis    CAD (coronary artery disease) 2007   50% stenosis small diagonal   Central sleep apnea    Chronic pain    since 1998 has been on chronic pain medication   Fatty liver    GERD (gastroesophageal reflux disease)    Hemorrhoids    Hepatomegaly 10/03/2014   HTN (hypertension)    Palpitations    Syncope    Tachycardia    Tobacco abuse    Tubular adenoma    Wolff-Parkinson-White (WPW) syndrome    says was cured with ablation   Past Surgical History:  Procedure Laterality Date   ADENOIDECTOMY     APPENDECTOMY      ATRIAL ABLATION SURGERY     AV NODE ABLATION     BIOPSY N/A 05/12/2013   Procedure: BIOPSY;  Surgeon: Daneil Dolin, MD;  Location: [facility];  Service: Endoscopy;  Laterality: N/A;   BIOPSY  12/20/2020   Procedure: BIOPSY;  Surgeon: Daneil Dolin, MD;  Location: [facility];  Service: Endoscopy;;   BIOPSY  09/18/2022   Procedure: BIOPSY;  Surgeon: Thornton Park, MD;  Location: [facility] ENDOSCOPY;  Service: Gastroenterology;;   CARDIAC CATHETERIZATION N/A 05/08/2016   Procedure: Right/Left Heart Cath and Coronary Angiography;  Surgeon: Peter M Martinique, MD;  Location: [place] [facility];  Service: Cardiovascular;  Laterality: N/A;   CARDIAC ELECTROPHYSIOLOGY STUDY AND ABLATION     SA node ablation   COLONOSCOPY     age 45   COLONOSCOPY WITH PROPOFOL N/A 05/12/2013   Dr. Gala Romney- hemorrhoids, tubular adenoma   ESOPHAGEAL DILATION  12/20/2020   Procedure: ESOPHAGEAL DILATION;  Surgeon: Daneil Dolin, MD;  Location: [facility];  Service: Endoscopy;;   ESOPHAGEAL DILATION N/A 12/20/2020   Procedure: ESOPHAGEAL DILATION;  Surgeon: Daneil Dolin, MD;  Location: [facility];  Service: Endoscopy;  Laterality: N/A;   ESOPHAGOGASTRODUODENOSCOPY (EGD) WITH PROPOFOL N/A 05/12/2013   Dr. Gala Romney- hiatal hernia, erosive reflux esophagitis   ESOPHAGOGASTRODUODENOSCOPY (EGD) WITH PROPOFOL N/A 12/20/2020   Procedure: ESOPHAGOGASTRODUODENOSCOPY (EGD) WITH PROPOFOL;  Surgeon: Daneil Dolin, MD;  [facility];  Service: Endoscopy;  Laterality: N/A;   ESOPHAGOGASTRODUODENOSCOPY (EGD) WITH PROPOFOL N/A 09/18/2022   Procedure: ESOPHAGOGASTRODUODENOSCOPY (EGD) WITH PROPOFOL;  Surgeon: Thornton Park, MD;  Location: [facility];  Service: Gastroenterology;  Laterality: N/A;   HAND SURGERY     HEMORRHOID BANDING  06/22/13   POLYPECTOMY N/A 05/12/2013   Procedure: POLYPECTOMY;  Surgeon: Daneil Dolin, MD;  Location: [facility];  Service: Endoscopy;  Laterality: N/A;   SHOULDER SURGERY     Family History   Problem Relation Age of Onset   Coronary artery disease Father        cabg   Colon cancer Father        age 22, chemo/surgery   Breast cancer Mother    Hypertension Mother     Social History   Tobacco Use   Smoking status: Every Day    Packs/day: 0.50    Years: 25.00    Total pack years: 12.50    Types: Cigarettes   Smokeless tobacco: Never  Substance Use Topics   Alcohol use: Yes    Comment: daily; 12/18/20-5 days/week, 6 drinks at time   Marital Status: Married  ROS  Review of Systems  Cardiovascular:  Positive for dyspnea on exertion and leg swelling. Negative for chest pain.   Objective      10/15/2022    2:38 PM 09/21/2022    7:16 AM 09/21/2022    3:51 AM  Vitals with BMI  Height '6\' 0"'$     Weight 231 lbs 13 oz    BMI 83.15    Systolic 176 160 737  Diastolic 86 82 83  Pulse 96 77 73   SpO2: 96 %  Physical Exam Constitutional:      Appearance: He is obese. He is ill-appearing.  Neck:     Vascular: JVD present. No carotid bruit.  Cardiovascular:     Rate and Rhythm: Normal rate and regular rhythm.     Pulses: Intact distal pulses.     Heart sounds: Normal heart sounds. No murmur heard.    No gallop.  Pulmonary:     Effort: Pulmonary effort is normal.     Breath sounds: Normal breath sounds.  Abdominal:     General: Bowel sounds are normal.     Palpations: Abdomen is soft.     Comments: Obese  Musculoskeletal:     Right lower leg: Edema (2+ bilateral pitting edema) present.     Left lower leg: Edema (2+ bilateral pitting edema) present.     Medications and allergies   Allergies  Allergen Reactions   Morphine Other (See Comments)    Makes overly sleepy     Medication list   Current Outpatient Medications:    albuterol (PROVENTIL) (2.5 MG/3ML) 0.083% nebulizer solution, Take 2.5 mg by nebulization every 4 (four) hours as needed., Disp: , Rfl:    albuterol (VENTOLIN HFA) 108 (90 Base) MCG/ACT inhaler, Inhale 2 puffs into the lungs every 6 (six)  hours as needed for wheezing or shortness of breath., Disp: , Rfl:    carvedilol (COREG) 3.125 MG tablet, Take 1 tablet (3.125 mg total) by mouth 2 (two) times daily with a meal., Disp: 60 tablet, Rfl: 0   chlordiazePOXIDE (LIBRIUM) 5 MG capsule, Take 1 pill twice a day for 2 days then 1 pill once a day for 2 days and stop, Disp: 6 capsule, Rfl: 0   Cholecalciferol (VITAMIN D3) 25 MCG (1000 UT) CAPS,  Take 2 capsules by mouth every evening., Disp: , Rfl:    famotidine (PEPCID) 20 MG tablet, Take 20 mg by mouth daily., Disp: , Rfl:    folic acid (FOLVITE) 1 MG tablet, Take 1 tablet (1 mg total) by mouth daily., Disp: 30 tablet, Rfl: 2   furosemide (LASIX) 40 MG tablet, Take 1 tablet (40 mg total) by mouth daily., Disp: 30 tablet, Rfl: 0   hydrOXYzine (ATARAX/VISTARIL) 25 MG tablet, Take 1 tablet (25 mg total) by mouth 3 (three) times daily as needed for anxiety or nausea., Disp: 60 tablet, Rfl: 2   Multiple Vitamin (MULTIVITAMIN WITH MINERALS) TABS tablet, Take 1 tablet by mouth daily., Disp: 120 tablet, Rfl: 2   nicotine (NICODERM CQ - DOSED IN MG/24 HOURS) 21 mg/24hr patch, Place 1 patch (21 mg total) onto the skin daily., Disp: 28 patch, Rfl: 0   nystatin cream (MYCOSTATIN), Apply 1 Application topically daily as needed for dry skin., Disp: , Rfl:    oxyCODONE (ROXICODONE) 15 MG immediate release tablet, Take 15 mg by mouth every 6 (six) hours as needed for pain., Disp: , Rfl: 0   sucralfate (CARAFATE) 1 GM/10ML suspension, Take 1 g by mouth 2 (two) times daily., Disp: , Rfl:    thiamine (VITAMIN B-1) 100 MG tablet, Take 1 tablet (100 mg total) by mouth daily., Disp: 30 tablet, Rfl: 0 Laboratory examination:   Recent Labs    09/19/22 0046 09/20/22 0030 09/21/22 0025  NA 132* 130* 132*  K 3.7 3.1* 3.7  CL 97* 98 98  CO2 '24 25 27  '$ GLUCOSE 108* 83 89  BUN '8 9 9  '$ CREATININE 1.03 1.27* 1.28*  CALCIUM 8.6* 8.4* 8.9  GFRNONAA >60 >60 >60      Latest Ref Rng & Units 09/21/2022   12:25 AM  09/20/2022   12:30 AM 09/19/2022   12:46 AM  CMP  Glucose 70 - 99 mg/dL 89  83  108   BUN 6 - 20 mg/dL '9  9  8   '$ Creatinine 0.61 - 1.24 mg/dL 1.28  1.27  1.03   Sodium 135 - 145 mmol/L 132  130  132   Potassium 3.5 - 5.1 mmol/L 3.7  3.1  3.7   Chloride 98 - 111 mmol/L 98  98  97   CO2 22 - 32 mmol/L '27  25  24   '$ Calcium 8.9 - 10.3 mg/dL 8.9  8.4  8.6   Total Protein 6.5 - 8.1 g/dL 7.5  7.0  7.3   Total Bilirubin 0.3 - 1.2 mg/dL 1.3  1.2  2.3   Alkaline Phos 38 - 126 U/L 187  174  192   AST 15 - 41 U/L 91  60  62   ALT 0 - 44 U/L 34  23  24       Latest Ref Rng & Units 09/21/2022   12:25 AM 09/20/2022   12:30 AM 09/19/2022   12:46 AM  Hepatic Function  Total Protein 6.5 - 8.1 g/dL 7.5  7.0  7.3   Albumin 3.5 - 5.0 g/dL 2.7  2.3  2.4   AST 15 - 41 U/L 91  60  62   ALT 0 - 44 U/L 34  23  24   Alk Phosphatase 38 - 126 U/L 187  174  192   Total Bilirubin 0.3 - 1.2 mg/dL 1.3  1.2  2.3        Latest Ref Rng & Units 09/21/2022  12:25 AM 09/20/2022   12:30 AM 09/19/2022   12:46 AM  Hepatic Function  Total Protein 6.5 - 8.1 g/dL 7.5  7.0  7.3   Albumin 3.5 - 5.0 g/dL 2.7  2.3  2.4   AST 15 - 41 U/L 91  60  62   ALT 0 - 44 U/L 34  23  24   Alk Phosphatase 38 - 126 U/L 187  174  192   Total Bilirubin 0.3 - 1.2 mg/dL 1.3  1.2  2.3    Lab Results  Component Value Date   CHOL 278 (H) 12/18/2020   HDL >135 12/18/2020   LDLCALC NOT CALCULATED 12/18/2020   TRIG 67 12/18/2020   CHOLHDL NOT CALCULATED 12/18/2020    HEMOGLOBIN A1C Lab Results  Component Value Date   HGBA1C 5.0 09/18/2022   MPG 97 09/18/2022   Lab Results  Component Value Date   TSH 1.812 08/27/2021     Radiology:   DG Chest 1 View  12/18/2020 CLINICAL DATA:  Chest pain. EXAM: CHEST  1 VIEW COMPARISON:  April 11, 2016. FINDINGS: The heart size and mediastinal contours are within normal limits. Both lungs are clear. No pneumothorax or pleural effusion is noted. The visualized skeletal structures are unremarkable.    Cardiac Studies:   Right and left heart catheterization 05/08/2016:  1. Nonobstructive CAD 2. Mild LV dysfunction- EF 45-50% 3. Elevated LVEDP 4. Normal right heart pressures    PCV MYOCARDIAL PERFUSION WO LEXISCAN 07/30/2020  Narrative Lexiscan Tetrofosmin Stress Test  07/30/2020: Nondiagnostic ECG stress. No stress lung uptake. The right ventricle is dilated. Mild diaphragmatic attenuation noted. Normal myocardial perfusion without ischemia or scar. Gated SPECT imaging of the left ventricle was abnormal.  Global hypokinesis. Severely enlarged left ventricle.  LV stress volume 290 ml.  Stress LV EF is severely dysfunctional 22%. Findings suggests non ischemic dilated cardiomyopathy. No previous exam available for comparison. High risk study due to low LVEF.  Echocardiogram 12/19/2020:  1. Left ventricular ejection fraction, by estimation, is 45 to 50%. The left ventricle has mildly decreased function. The left ventricle has no regional wall motion abnormalities. There is moderate left ventricular hypertrophy. Left ventricular  diastolic parameters are consistent with Grade I diastolic dysfunction (impaired relaxation).  2. Right ventricular systolic function is normal. The right ventricular size is normal.  3. Left atrial size was mildly dilated.  4. The mitral valve is normal in structure. No evidence of mitral valve regurgitation. No evidence of mitral stenosis.  5. The aortic valve is tricuspid. There is mild calcification of the aortic valve. There is mild thickening of the aortic valve. Aortic valve regurgitation is not visualized. No aortic stenosis is present.  6. The inferior vena cava is normal in size with greater than 50% respiratory variability, suggesting right atrial pressure of 3 mmHg. No significant change from 07/30/2020  EKG:   EKG 10/15/2022: Sinus rhythm with short PR interval at the rate of 91 bpm, otherwise normal EKG.  Compared to 07/18/2020, right bundle  branch block and anterolateral ST-T wave changes suggestive of ischemia are not present.    Assessment     ICD-10-CM   1. Chronic diastolic heart failure (HCC)  I50.32 EKG 12-Lead    PCV ECHOCARDIOGRAM COMPLETE    2. Primary hypertension  I10     3. Tobacco abuse  Z72.0     4. GI bleed due to NSAIDs  K92.2    T39.395A       Orders Placed  This Encounter  Procedures   EKG 12-Lead   PCV ECHOCARDIOGRAM COMPLETE    Standing Status:   Future    Standing Expiration Date:   10/16/2023    No orders of the defined types were placed in this encounter.   Medications Discontinued During This Encounter  Medication Reason   spironolactone (ALDACTONE) 25 MG tablet Side effect (s)     Recommendations:   Lars Jeziorski Artus is a 52 y.o.male with medical history significant of Barrett's esophagus, GERD, alcoholic gastritis and PUD with gastric ulcers, HTN, tobacco abuse,  fatty liver disease, hypertension, hyperlipidemia, non-ischemic cardiomyopathy with mild LV systolic dysfunction, history of WPW syndrome SP ablation x3 in 1995, 1998 and 2001 at [facility], nonobstructive coronary disease has diagonals in in 2017 presents to reestablish care, last seen 3 years ago. His last GI bleed was on 09/17/2022 related to severe esophagitis, and gastritis and recent bleed due to NSAID use and possibly contributed by alcohol use and smoking.  Presently thinking about 3 light beers a day, states that he does not drink any more than this.  Still smokes about 1.5 packs of cigarettes a day.  Has poor eating habits.  1. Chronic diastolic heart failure ([place]) Patient recently was started on diuretics for chronic diastolic heart failure, has lost about 20 pounds in weight with significant improvement in edema.  He does not remember what medication it was, it was apart from furosemide prescribed by Dr. Gerarda Fraction.  Patient will let us know the medication.  He is presently not using this.  I had a long discussion  with the patient that his acute on chronic diastolic heart failure is precipitated by poor diet, continued noncompliance with fluid intake and recent GI bleed related to NSAID which led to acute diastolic heart failure.  Advised him that I would not like to make any changes right now, lengthy discussion regarding eating bland food, reducing salt intake, avoiding fried food and salty food, advised him to cut the alcohol intake to 1 beer a day instead of 3 beers and to smoke half a pack a day as he is presently smoking greater than 1 pack of cigarettes a day.  Advised him that just making this change that would change a large amount of how he feels with regard to diastolic heart failure, worsening dyspnea.  Weight loss also discussed.  I would like to repeat echocardiogram as it was >1-year ago.  2. Primary hypertension Blood pressure is well-controlled, suspect blood pressure will further improve if you are to make dietary changes.  3. Tobacco abuse Presently smoking 1.5 packs of cigarettes a day unfortunately.  Smoking cessation discussed.    4. GI bleed due to NSAIDs He has had recent GI bleed on 09/17/2022, he has had GI bleed in the past.  Alcohol could have contributed to this but I truly believe that this is related to excessive NSAID use.  He was using Goody's powder frequently.  He has stopped NSAIDs and also stopped aspirin 81 mg daily.  He has abnormal LFTs, probably related to fatty liver.  Ultrasound of the abdomen performed in January 2024 did not reveal any cirrhosis.  He had complained of gynecomastia and mastodynia, spironolactone has been discontinued.  It will take some time for this to reverse, he was scheduled for ultrasound of the breast and also scheduled for lower extremity venous duplex, my suspicion for malignancy is low, spironolactone along with alcohol use may have contributed to his gynecomastia.  No  clinical evidence of DVT.  I reviewed all his medical records from the  hospitalization.  All questions answered, his father is present at the bedside who is also a patient of mine.  I also reviewed cardiology consultation performed by [facility] while in the hospital.  I would like to see the patient back in 3 weeks.    Adrian Prows, MD, [facility] 10/15/2022, [place] PM Office: [phone]

## 2022-10-17 ENCOUNTER — Ambulatory Visit: Payer: 59 | Admitting: Internal Medicine

## 2022-10-22 ENCOUNTER — Other Ambulatory Visit: Payer: 59

## 2022-10-26 NOTE — Progress Notes (Deleted)
Referring Provider: Redmond School, MD Primary Care Physician:  Redmond School, MD Primary GI Physician: Dr. Gala Romney  No chief complaint on file.   HPI:   Timothy [name] is a 52 y.o. male with history of NSAID and alcohol abuse, CAD, HTN, HLD, nonischemic cardiomyopathy with moderate LV systolic dysfunction, history of WPW s/p ablation x3, sleep apnea, GERD, reflux esophagitis, Barrett's esophagus, PUD, fatty liver,   Admitted to [facility] 09/17/22 with hematemesis, admitted with upper GI bleed in the setting of daily aspirin and frequent BC powders, as well as hypokalemia, hypomagnesemia, and AKI. Hemoglobin was 14.2 day of admission, 13.5 on day of discharge. He was gently hydrated, diuretics adjusted, and electrolytes replaced. EGD 09/18/22: Grade D esophagitis with ulceration, recently bleeding, gastritis biopsied (negative for H pylori). Repeat EGD in 8 weeks with Dr. Gala Romney to assess healing.   Today:   Upper GI bleed:   History of adenomatous colon polyps:  Colonoscopy in 2014: Grade 3 hemorrhoids, colonic and rectal polyps ranging up to 67m. Pathology with tubular adenomas.    Elevated LFTs:  Alcohol:  Tylenol:  Illicit drug use:  Herbals: Supplements:   Hepatitis A, B, C negative 09/18/22. Immune to Hep B.  Ferritin and saturation elevated in April 2022.  Abdominal [place]orea1/4/24: Hepatic steatosis, normal spleen.  Platelets have been normal.    ECHO scheduled 10/27/22. Recently with some swelling from CHF.   Past Medical History:  Diagnosis Date   Anxiety    Arthritis    CAD (coronary artery disease) 2007   50% stenosis small diagonal   Central sleep apnea    Chronic pain    since 1998 has been on chronic pain medication   Fatty liver    GERD (gastroesophageal reflux disease)    Hemorrhoids    Hepatomegaly 10/03/2014   HTN (hypertension)    Palpitations    Syncope    Tachycardia    Tobacco abuse    Tubular adenoma    Wolff-Parkinson-White (WPW) syndrome     says was cured with ablation    Past Surgical History:  Procedure Laterality Date   ADENOIDECTOMY     APPENDECTOMY     ATRIAL ABLATION SURGERY     AV NODE ABLATION     BIOPSY N/A 05/12/2013   Procedure: BIOPSY;  Surgeon: RDaneil Dolin MD;  [facility]: [facility];  Service: Endoscopy;  Laterality: N/A;   BIOPSY  12/20/2020   Procedure: BIOPSY;  Surgeon: RDaneil Dolin MD;  [facility];  Service: Endoscopy;;   BIOPSY  09/18/2022   Procedure: BIOPSY;  Surgeon: BThornton Park MD;  [facility];  Service: Gastroenterology;;   CARDIAC CATHETERIZATION N/A 05/08/2016   Procedure: Right/Left Heart Cath and Coronary Angiography;  Surgeon: Peter M JMartinique MD;  [facility];  Service: Cardiovascular;  Laterality: N/A;   CARDIAC ELECTROPHYSIOLOGY STUDY AND ABLATION     SA node ablation   COLONOSCOPY     age 52  COLONOSCOPY WITH PROPOFOL N/A 05/12/2013   Dr. RGala Romney hemorrhoids, tubular adenoma   ESOPHAGEAL DILATION  12/20/2020   Procedure: ESOPHAGEAL DILATION;  Surgeon: RDaneil Dolin MD;  [facility];  Service: Endoscopy;;   ESOPHAGEAL DILATION N/A 12/20/2020   Procedure: ESOPHAGEAL DILATION;  Surgeon: RDaneil Dolin MD;  [facility];  Service: Endoscopy;  Laterality: N/A;   ESOPHAGOGASTRODUODENOSCOPY (EGD) WITH PROPOFOL N/A 05/12/2013   Dr. RGala Romney hiatal hernia, erosive reflux esophagitis   ESOPHAGOGASTRODUODENOSCOPY (  EGD) WITH PROPOFOL N/A 12/20/2020   Procedure: ESOPHAGOGASTRODUODENOSCOPY (EGD) WITH PROPOFOL;  Surgeon: Daneil Dolin, MD;  [facility];  Service: Endoscopy;  Laterality: N/A;   ESOPHAGOGASTRODUODENOSCOPY (EGD) WITH PROPOFOL N/A 09/18/2022   Procedure: ESOPHAGOGASTRODUODENOSCOPY (EGD) WITH PROPOFOL;  Surgeon: Thornton Park, MD;  Location: [place];  Service: Gastroenterology;  Laterality: N/A;   HAND SURGERY     HEMORRHOID BANDING  06/22/13   POLYPECTOMY N/A 05/12/2013   Procedure: POLYPECTOMY;  Surgeon:  Daneil Dolin, MD;  Location: [facility];  Service: Endoscopy;  Laterality: N/A;   SHOULDER SURGERY      Current Outpatient Medications  Medication Sig Dispense Refill   albuterol (PROVENTIL) (2.5 MG/3ML) 0.083% nebulizer solution Take 2.5 mg by nebulization every 4 (four) hours as needed.     albuterol (VENTOLIN HFA) 108 (90 Base) MCG/ACT inhaler Inhale 2 puffs into the lungs every 6 (six) hours as needed for wheezing or shortness of breath.     carvedilol (COREG) 3.125 MG tablet Take 1 tablet (3.125 mg total) by mouth 2 (two) times daily with a meal. 60 tablet 0   chlordiazePOXIDE (LIBRIUM) 5 MG capsule Take 1 pill twice a day for 2 days then 1 pill once a day for 2 days and stop 6 capsule 0   Cholecalciferol (VITAMIN D3) 25 MCG (1000 UT) CAPS Take 2 capsules by mouth every evening.     famotidine (PEPCID) 20 MG tablet Take 20 mg by mouth daily.     folic acid (FOLVITE) 1 MG tablet Take 1 tablet (1 mg total) by mouth daily. 30 tablet 2   furosemide (LASIX) 40 MG tablet Take 1 tablet (40 mg total) by mouth daily. 30 tablet 0   hydrOXYzine (ATARAX/VISTARIL) 25 MG tablet Take 1 tablet (25 mg total) by mouth 3 (three) times daily as needed for anxiety or nausea. 60 tablet 2   Multiple Vitamin (MULTIVITAMIN WITH MINERALS) TABS tablet Take 1 tablet by mouth daily. 120 tablet 2   nicotine (NICODERM CQ - DOSED IN MG/24 HOURS) 21 mg/24hr patch Place 1 patch (21 mg total) onto the skin daily. 28 patch 0   nystatin cream (MYCOSTATIN) Apply 1 Application topically daily as needed for dry skin.     oxyCODONE (ROXICODONE) 15 MG immediate release tablet Take 15 mg by mouth every 6 (six) hours as needed for pain.  0   sucralfate (CARAFATE) 1 GM/10ML suspension Take 1 g by mouth 2 (two) times daily.     thiamine (VITAMIN B-1) 100 MG tablet Take 1 tablet (100 mg total) by mouth daily. 30 tablet 0   No current facility-administered medications for this visit.    Allergies as of 10/27/2022 - Review Complete  10/15/2022  Allergen Reaction Noted   Morphine Other (See Comments)     Family History  Problem Relation Age of Onset   Coronary artery disease Father        cabg   Colon cancer Father        age 9, chemo/surgery   Breast cancer Mother    Hypertension Mother     Social History   Socioeconomic History   Marital status: Married    Spouse name: Not on file   Number of children: 2   Years of education: Not on file   Highest education level: Not on file  Occupational History   Occupation: unemployed    Employer: UNEMPLOYED  Tobacco Use   Smoking status: Every Day    Packs/day: 0.50  Years: 25.00    Total pack years: 12.50    Types: Cigarettes   Smokeless tobacco: Never  Vaping Use   Vaping Use: Never used  Substance and Sexual Activity   Alcohol use: Yes    Comment: daily; 12/18/20-5 days/week, 6 drinks at time   Drug use: Yes    Types: Marijuana    Comment: hx marijuana use   Sexual activity: Yes    Birth control/protection: None  Other Topics Concern   Not on file  Social History Narrative   Not on file   Social Determinants of Health   Financial Resource Strain: Not on file  Food Insecurity: No Food Insecurity (09/18/2022)   Hunger Vital Sign    Worried About Running Out of Food in the Last Year: Never true    Ran Out of Food in the Last Year: Never true  Transportation Needs: No Transportation Needs (09/18/2022)   PRAPARE - Hydrologist (Medical): No    Lack of Transportation (Non-Medical): No  Physical Activity: Not on file  Stress: Not on file  Social Connections: Not on file    Review of Systems: Gen: Denies fever, chills, anorexia. Denies fatigue, weakness, weight loss.  CV: Denies chest pain, palpitations, syncope, peripheral edema, and claudication. Resp: Denies dyspnea at rest, cough, wheezing, coughing up blood, and pleurisy. GI: Denies vomiting blood, jaundice, and fecal incontinence.   Denies dysphagia or  odynophagia. Derm: Denies rash, itching, dry skin Psych: Denies depression, anxiety, memory loss, confusion. No homicidal or suicidal ideation.  Heme: Denies bruising, bleeding, and enlarged lymph nodes.  Physical Exam: There were no vitals taken for this visit. General:   Alert and oriented. No distress noted. Pleasant and cooperative.  Head:  Normocephalic and atraumatic. Eyes:  Conjuctiva clear without scleral icterus. Heart:  S1, S2 present without murmurs appreciated. Lungs:  Clear to auscultation bilaterally. No wheezes, rales, or rhonchi. No distress.  Abdomen:  +BS, soft, non-tender and non-distended. No rebound or guarding. No HSM or masses noted. Msk:  Symmetrical without gross deformities. Normal posture. Extremities:  Without edema. Neurologic:  Alert and  oriented x4 Psych:  Normal mood and affect.    Assessment:     Plan:  ***   Aliene Altes, PA-C [facility] Gastroenterology 10/27/2022

## 2022-10-27 ENCOUNTER — Other Ambulatory Visit: Payer: 59

## 2022-10-27 ENCOUNTER — Inpatient Hospital Stay: Payer: 59 | Admitting: Gastroenterology

## 2022-11-04 ENCOUNTER — Other Ambulatory Visit: Payer: 59

## 2022-11-06 DIAGNOSIS — I5042 Chronic combined systolic (congestive) and diastolic (congestive) heart failure: Secondary | ICD-10-CM | POA: Diagnosis not present

## 2022-11-06 DIAGNOSIS — M4722 Other spondylosis with radiculopathy, cervical region: Secondary | ICD-10-CM | POA: Diagnosis not present

## 2022-11-06 DIAGNOSIS — U071 COVID-19: Secondary | ICD-10-CM | POA: Diagnosis not present

## 2022-11-06 DIAGNOSIS — K219 Gastro-esophageal reflux disease without esophagitis: Secondary | ICD-10-CM | POA: Diagnosis not present

## 2022-11-10 ENCOUNTER — Ambulatory Visit: Payer: 59 | Admitting: Cardiology

## 2022-12-05 DIAGNOSIS — J0100 Acute maxillary sinusitis, unspecified: Secondary | ICD-10-CM | POA: Diagnosis not present

## 2022-12-05 DIAGNOSIS — M4722 Other spondylosis with radiculopathy, cervical region: Secondary | ICD-10-CM | POA: Diagnosis not present

## 2022-12-05 DIAGNOSIS — I10 Essential (primary) hypertension: Secondary | ICD-10-CM | POA: Diagnosis not present

## 2022-12-05 DIAGNOSIS — I700 Atherosclerosis of aorta: Secondary | ICD-10-CM | POA: Diagnosis not present

## 2022-12-05 DIAGNOSIS — J029 Acute pharyngitis, unspecified: Secondary | ICD-10-CM | POA: Diagnosis not present

## 2022-12-23 ENCOUNTER — Ambulatory Visit (HOSPITAL_COMMUNITY): Admission: RE | Admit: 2022-12-23 | Payer: 59 | Source: Ambulatory Visit

## 2022-12-24 ENCOUNTER — Ambulatory Visit (HOSPITAL_COMMUNITY)
Admission: RE | Admit: 2022-12-24 | Discharge: 2022-12-24 | Disposition: A | Discharge status: Home / Self Care | Payer: 59 | Source: Ambulatory Visit | Attending: Internal Medicine | Admitting: Internal Medicine

## 2022-12-24 DIAGNOSIS — M79662 Pain in left lower leg: Secondary | ICD-10-CM | POA: Diagnosis not present

## 2022-12-24 DIAGNOSIS — M79606 Pain in leg, unspecified: Secondary | ICD-10-CM | POA: Insufficient documentation

## 2022-12-24 DIAGNOSIS — M79661 Pain in right lower leg: Secondary | ICD-10-CM | POA: Diagnosis not present

## 2022-12-25 DIAGNOSIS — R600 Localized edema: Secondary | ICD-10-CM | POA: Diagnosis not present

## 2022-12-25 DIAGNOSIS — E349 Endocrine disorder, unspecified: Secondary | ICD-10-CM | POA: Diagnosis not present

## 2022-12-25 DIAGNOSIS — I10 Essential (primary) hypertension: Secondary | ICD-10-CM | POA: Diagnosis not present

## 2022-12-25 DIAGNOSIS — I700 Atherosclerosis of aorta: Secondary | ICD-10-CM | POA: Diagnosis not present

## 2022-12-25 DIAGNOSIS — I5042 Chronic combined systolic (congestive) and diastolic (congestive) heart failure: Secondary | ICD-10-CM | POA: Diagnosis not present

## 2022-12-25 DIAGNOSIS — M4722 Other spondylosis with radiculopathy, cervical region: Secondary | ICD-10-CM | POA: Diagnosis not present

## 2022-12-25 DIAGNOSIS — N62 Hypertrophy of breast: Secondary | ICD-10-CM | POA: Diagnosis not present

## 2022-12-25 DIAGNOSIS — I872 Venous insufficiency (chronic) (peripheral): Secondary | ICD-10-CM | POA: Diagnosis not present

## 2022-12-25 DIAGNOSIS — G894 Chronic pain syndrome: Secondary | ICD-10-CM | POA: Diagnosis not present

## 2022-12-31 DIAGNOSIS — C44319 Basal cell carcinoma of skin of other parts of face: Secondary | ICD-10-CM | POA: Diagnosis not present

## 2022-12-31 DIAGNOSIS — C44311 Basal cell carcinoma of skin of nose: Secondary | ICD-10-CM | POA: Diagnosis not present

## 2022-12-31 DIAGNOSIS — C44622 Squamous cell carcinoma of skin of right upper limb, including shoulder: Secondary | ICD-10-CM | POA: Diagnosis not present

## 2022-12-31 DIAGNOSIS — C44612 Basal cell carcinoma of skin of right upper limb, including shoulder: Secondary | ICD-10-CM | POA: Diagnosis not present

## 2023-01-26 DIAGNOSIS — R600 Localized edema: Secondary | ICD-10-CM | POA: Diagnosis not present

## 2023-01-26 DIAGNOSIS — I5042 Chronic combined systolic (congestive) and diastolic (congestive) heart failure: Secondary | ICD-10-CM | POA: Diagnosis not present

## 2023-01-26 DIAGNOSIS — M4722 Other spondylosis with radiculopathy, cervical region: Secondary | ICD-10-CM | POA: Diagnosis not present

## 2023-01-26 DIAGNOSIS — K219 Gastro-esophageal reflux disease without esophagitis: Secondary | ICD-10-CM | POA: Diagnosis not present

## 2023-03-31 DIAGNOSIS — G894 Chronic pain syndrome: Secondary | ICD-10-CM | POA: Diagnosis not present

## 2023-04-22 ENCOUNTER — Telehealth: Payer: Self-pay

## 2023-04-22 NOTE — Telephone Encounter (Signed)
04/22/23 - lvm to schedule Echo & OV

## 2023-04-28 ENCOUNTER — Inpatient Hospital Stay (HOSPITAL_COMMUNITY)
Admission: EM | Admit: 2023-04-28 | Discharge: 2023-05-12 | DRG: 433 | Disposition: A | Discharge status: Skilled Nursing Facility | Payer: 59 | Attending: Internal Medicine | Admitting: Internal Medicine

## 2023-04-28 ENCOUNTER — Emergency Department (HOSPITAL_COMMUNITY): Payer: 59

## 2023-04-28 ENCOUNTER — Other Ambulatory Visit: Payer: Self-pay

## 2023-04-28 DIAGNOSIS — F1120 Opioid dependence, uncomplicated: Secondary | ICD-10-CM | POA: Diagnosis present

## 2023-04-28 DIAGNOSIS — K766 Portal hypertension: Secondary | ICD-10-CM | POA: Diagnosis present

## 2023-04-28 DIAGNOSIS — F1010 Alcohol abuse, uncomplicated: Secondary | ICD-10-CM | POA: Diagnosis not present

## 2023-04-28 DIAGNOSIS — D539 Nutritional anemia, unspecified: Secondary | ICD-10-CM | POA: Diagnosis present

## 2023-04-28 DIAGNOSIS — Y908 Blood alcohol level of 240 mg/100 ml or more: Secondary | ICD-10-CM | POA: Diagnosis present

## 2023-04-28 DIAGNOSIS — M6281 Muscle weakness (generalized): Secondary | ICD-10-CM | POA: Diagnosis not present

## 2023-04-28 DIAGNOSIS — F17210 Nicotine dependence, cigarettes, uncomplicated: Secondary | ICD-10-CM | POA: Diagnosis present

## 2023-04-28 DIAGNOSIS — I10 Essential (primary) hypertension: Secondary | ICD-10-CM | POA: Diagnosis present

## 2023-04-28 DIAGNOSIS — Z8249 Family history of ischemic heart disease and other diseases of the circulatory system: Secondary | ICD-10-CM

## 2023-04-28 DIAGNOSIS — I509 Heart failure, unspecified: Secondary | ICD-10-CM

## 2023-04-28 DIAGNOSIS — I959 Hypotension, unspecified: Secondary | ICD-10-CM | POA: Diagnosis present

## 2023-04-28 DIAGNOSIS — I428 Other cardiomyopathies: Secondary | ICD-10-CM | POA: Diagnosis present

## 2023-04-28 DIAGNOSIS — E669 Obesity, unspecified: Secondary | ICD-10-CM | POA: Diagnosis present

## 2023-04-28 DIAGNOSIS — I5022 Chronic systolic (congestive) heart failure: Secondary | ICD-10-CM | POA: Diagnosis present

## 2023-04-28 DIAGNOSIS — K759 Inflammatory liver disease, unspecified: Secondary | ICD-10-CM | POA: Diagnosis not present

## 2023-04-28 DIAGNOSIS — I2510 Atherosclerotic heart disease of native coronary artery without angina pectoris: Secondary | ICD-10-CM | POA: Diagnosis present

## 2023-04-28 DIAGNOSIS — M79605 Pain in left leg: Secondary | ICD-10-CM | POA: Diagnosis not present

## 2023-04-28 DIAGNOSIS — R9431 Abnormal electrocardiogram [ECG] [EKG]: Secondary | ICD-10-CM | POA: Diagnosis present

## 2023-04-28 DIAGNOSIS — K921 Melena: Secondary | ICD-10-CM

## 2023-04-28 DIAGNOSIS — E785 Hyperlipidemia, unspecified: Secondary | ICD-10-CM | POA: Diagnosis present

## 2023-04-28 DIAGNOSIS — F411 Generalized anxiety disorder: Secondary | ICD-10-CM | POA: Diagnosis not present

## 2023-04-28 DIAGNOSIS — F10139 Alcohol abuse with withdrawal, unspecified: Secondary | ICD-10-CM | POA: Diagnosis present

## 2023-04-28 DIAGNOSIS — K567 Ileus, unspecified: Secondary | ICD-10-CM

## 2023-04-28 DIAGNOSIS — K2270 Barrett's esophagus without dysplasia: Secondary | ICD-10-CM | POA: Diagnosis not present

## 2023-04-28 DIAGNOSIS — X58XXXA Exposure to other specified factors, initial encounter: Secondary | ICD-10-CM | POA: Diagnosis present

## 2023-04-28 DIAGNOSIS — Z803 Family history of malignant neoplasm of breast: Secondary | ICD-10-CM

## 2023-04-28 DIAGNOSIS — R32 Unspecified urinary incontinence: Secondary | ICD-10-CM | POA: Diagnosis present

## 2023-04-28 DIAGNOSIS — D6489 Other specified anemias: Secondary | ICD-10-CM | POA: Diagnosis not present

## 2023-04-28 DIAGNOSIS — E876 Hypokalemia: Secondary | ICD-10-CM | POA: Diagnosis present

## 2023-04-28 DIAGNOSIS — R109 Unspecified abdominal pain: Secondary | ICD-10-CM | POA: Diagnosis present

## 2023-04-28 DIAGNOSIS — Z800 Family history of malignant neoplasm of digestive organs: Secondary | ICD-10-CM

## 2023-04-28 DIAGNOSIS — I700 Atherosclerosis of aorta: Secondary | ICD-10-CM | POA: Diagnosis not present

## 2023-04-28 DIAGNOSIS — I110 Hypertensive heart disease with heart failure: Secondary | ICD-10-CM | POA: Diagnosis present

## 2023-04-28 DIAGNOSIS — E871 Hypo-osmolality and hyponatremia: Secondary | ICD-10-CM | POA: Diagnosis present

## 2023-04-28 DIAGNOSIS — T402X5A Adverse effect of other opioids, initial encounter: Secondary | ICD-10-CM | POA: Diagnosis present

## 2023-04-28 DIAGNOSIS — R188 Other ascites: Secondary | ICD-10-CM | POA: Diagnosis not present

## 2023-04-28 DIAGNOSIS — I456 Pre-excitation syndrome: Secondary | ICD-10-CM | POA: Diagnosis present

## 2023-04-28 DIAGNOSIS — I5032 Chronic diastolic (congestive) heart failure: Secondary | ICD-10-CM | POA: Diagnosis not present

## 2023-04-28 DIAGNOSIS — R7989 Other specified abnormal findings of blood chemistry: Secondary | ICD-10-CM | POA: Diagnosis not present

## 2023-04-28 DIAGNOSIS — Z8719 Personal history of other diseases of the digestive system: Secondary | ICD-10-CM

## 2023-04-28 DIAGNOSIS — K6389 Other specified diseases of intestine: Secondary | ICD-10-CM | POA: Diagnosis not present

## 2023-04-28 DIAGNOSIS — K7460 Unspecified cirrhosis of liver: Secondary | ICD-10-CM | POA: Diagnosis not present

## 2023-04-28 DIAGNOSIS — R1084 Generalized abdominal pain: Secondary | ICD-10-CM

## 2023-04-28 DIAGNOSIS — Z885 Allergy status to narcotic agent status: Secondary | ICD-10-CM

## 2023-04-28 DIAGNOSIS — K7030 Alcoholic cirrhosis of liver without ascites: Secondary | ICD-10-CM | POA: Diagnosis not present

## 2023-04-28 DIAGNOSIS — S301XXA Contusion of abdominal wall, initial encounter: Secondary | ICD-10-CM | POA: Diagnosis present

## 2023-04-28 DIAGNOSIS — Z86010 Personal history of colonic polyps: Secondary | ICD-10-CM

## 2023-04-28 DIAGNOSIS — Z6831 Body mass index (BMI) 31.0-31.9, adult: Secondary | ICD-10-CM

## 2023-04-28 DIAGNOSIS — R945 Abnormal results of liver function studies: Secondary | ICD-10-CM | POA: Diagnosis not present

## 2023-04-28 DIAGNOSIS — R600 Localized edema: Secondary | ICD-10-CM | POA: Diagnosis not present

## 2023-04-28 DIAGNOSIS — Z8679 Personal history of other diseases of the circulatory system: Secondary | ICD-10-CM | POA: Diagnosis not present

## 2023-04-28 DIAGNOSIS — G8929 Other chronic pain: Secondary | ICD-10-CM | POA: Diagnosis present

## 2023-04-28 DIAGNOSIS — K7469 Other cirrhosis of liver: Secondary | ICD-10-CM | POA: Diagnosis not present

## 2023-04-28 DIAGNOSIS — N281 Cyst of kidney, acquired: Secondary | ICD-10-CM | POA: Diagnosis not present

## 2023-04-28 DIAGNOSIS — K760 Fatty (change of) liver, not elsewhere classified: Secondary | ICD-10-CM | POA: Diagnosis present

## 2023-04-28 DIAGNOSIS — K7011 Alcoholic hepatitis with ascites: Secondary | ICD-10-CM | POA: Diagnosis present

## 2023-04-28 DIAGNOSIS — D649 Anemia, unspecified: Secondary | ICD-10-CM

## 2023-04-28 DIAGNOSIS — Z8711 Personal history of peptic ulcer disease: Secondary | ICD-10-CM

## 2023-04-28 DIAGNOSIS — K219 Gastro-esophageal reflux disease without esophagitis: Secondary | ICD-10-CM | POA: Diagnosis present

## 2023-04-28 DIAGNOSIS — I9589 Other hypotension: Secondary | ICD-10-CM | POA: Diagnosis not present

## 2023-04-28 DIAGNOSIS — Z560 Unemployment, unspecified: Secondary | ICD-10-CM

## 2023-04-28 DIAGNOSIS — Z515 Encounter for palliative care: Secondary | ICD-10-CM | POA: Diagnosis not present

## 2023-04-28 DIAGNOSIS — K429 Umbilical hernia without obstruction or gangrene: Secondary | ICD-10-CM | POA: Diagnosis present

## 2023-04-28 DIAGNOSIS — K5903 Drug induced constipation: Secondary | ICD-10-CM | POA: Diagnosis not present

## 2023-04-28 DIAGNOSIS — Z741 Need for assistance with personal care: Secondary | ICD-10-CM | POA: Diagnosis not present

## 2023-04-28 DIAGNOSIS — R609 Edema, unspecified: Secondary | ICD-10-CM | POA: Diagnosis not present

## 2023-04-28 DIAGNOSIS — K7031 Alcoholic cirrhosis of liver with ascites: Principal | ICD-10-CM | POA: Diagnosis present

## 2023-04-28 DIAGNOSIS — R140 Abdominal distension (gaseous): Secondary | ICD-10-CM | POA: Diagnosis not present

## 2023-04-28 DIAGNOSIS — Z79899 Other long term (current) drug therapy: Secondary | ICD-10-CM

## 2023-04-28 DIAGNOSIS — G4731 Primary central sleep apnea: Secondary | ICD-10-CM | POA: Diagnosis present

## 2023-04-28 DIAGNOSIS — M79604 Pain in right leg: Secondary | ICD-10-CM | POA: Diagnosis not present

## 2023-04-28 DIAGNOSIS — Z7189 Other specified counseling: Secondary | ICD-10-CM | POA: Diagnosis not present

## 2023-04-28 DIAGNOSIS — F10939 Alcohol use, unspecified with withdrawal, unspecified: Secondary | ICD-10-CM | POA: Diagnosis present

## 2023-04-28 DIAGNOSIS — K2101 Gastro-esophageal reflux disease with esophagitis, with bleeding: Secondary | ICD-10-CM | POA: Diagnosis not present

## 2023-04-28 DIAGNOSIS — F10930 Alcohol use, unspecified with withdrawal, uncomplicated: Secondary | ICD-10-CM | POA: Diagnosis not present

## 2023-04-28 DIAGNOSIS — D6959 Other secondary thrombocytopenia: Secondary | ICD-10-CM | POA: Diagnosis present

## 2023-04-28 DIAGNOSIS — R400 Somnolence: Secondary | ICD-10-CM | POA: Diagnosis present

## 2023-04-28 LAB — CBC WITH DIFFERENTIAL/PLATELET
Abs Immature Granulocytes: 0.02 10*3/uL (ref 0.00–0.07)
Basophils Absolute: 0.1 10*3/uL (ref 0.0–0.1)
Basophils Relative: 1 %
Eosinophils Absolute: 0.1 10*3/uL (ref 0.0–0.5)
Eosinophils Relative: 1 %
HCT: 33.8 % — ABNORMAL LOW (ref 39.0–52.0)
Hemoglobin: 11.7 g/dL — ABNORMAL LOW (ref 13.0–17.0)
Immature Granulocytes: 0 %
Lymphocytes Relative: 16 %
Lymphs Abs: 1.5 10*3/uL (ref 0.7–4.0)
MCH: 31.9 pg (ref 26.0–34.0)
MCHC: 34.6 g/dL (ref 30.0–36.0)
MCV: 92.1 fL (ref 80.0–100.0)
Monocytes Absolute: 0.8 10*3/uL (ref 0.1–1.0)
Monocytes Relative: 9 %
Neutro Abs: 6.7 10*3/uL (ref 1.7–7.7)
Neutrophils Relative %: 73 %
Platelets: 126 10*3/uL — ABNORMAL LOW (ref 150–400)
RBC: 3.67 MIL/uL — ABNORMAL LOW (ref 4.22–5.81)
RDW: 17.9 % — ABNORMAL HIGH (ref 11.5–15.5)
WBC: 9.2 10*3/uL (ref 4.0–10.5)
nRBC: 0 % (ref 0.0–0.2)

## 2023-04-28 LAB — COMPREHENSIVE METABOLIC PANEL
ALT: 42 U/L (ref 0–44)
ALT: 39 U/L (ref 0–44)
AST: 174 U/L — ABNORMAL HIGH (ref 15–41)
AST: 157 U/L — ABNORMAL HIGH (ref 15–41)
Albumin: 2.6 g/dL — ABNORMAL LOW (ref 3.5–5.0)
Albumin: 2.4 g/dL — ABNORMAL LOW (ref 3.5–5.0)
Alkaline Phosphatase: 217 U/L — ABNORMAL HIGH (ref 38–126)
Alkaline Phosphatase: 220 U/L — ABNORMAL HIGH (ref 38–126)
Anion gap: 14 (ref 5–15)
Anion gap: 12 (ref 5–15)
BUN: 7 mg/dL (ref 6–20)
BUN: 7 mg/dL (ref 6–20)
CO2: 31 mmol/L (ref 22–32)
CO2: 29 mmol/L (ref 22–32)
Calcium: 8.2 mg/dL — ABNORMAL LOW (ref 8.9–10.3)
Calcium: 7.8 mg/dL — ABNORMAL LOW (ref 8.9–10.3)
Chloride: 87 mmol/L — ABNORMAL LOW (ref 98–111)
Chloride: 90 mmol/L — ABNORMAL LOW (ref 98–111)
Creatinine, Ser: 1.19 mg/dL (ref 0.61–1.24)
Creatinine, Ser: 1.09 mg/dL (ref 0.61–1.24)
GFR, Estimated: >60 mL/min (ref >60)
GFR, Estimated: >60 mL/min (ref >60)
Glucose, Bld: 110 mg/dL — ABNORMAL HIGH (ref 70–99)
Glucose, Bld: 93 mg/dL (ref 70–99)
Potassium: 2.4 mmol/L — CL (ref 3.5–5.1)
Potassium: 2.6 mmol/L — CL (ref 3.5–5.1)
Sodium: 132 mmol/L — ABNORMAL LOW (ref 135–145)
Sodium: 131 mmol/L — ABNORMAL LOW (ref 135–145)
Total Bilirubin: 3.6 mg/dL — ABNORMAL HIGH (ref 0.3–1.2)
Total Bilirubin: 3.9 mg/dL — ABNORMAL HIGH (ref 0.3–1.2)
Total Protein: 7.3 g/dL (ref 6.5–8.1)
Total Protein: 6.8 g/dL (ref 6.5–8.1)

## 2023-04-28 LAB — MAGNESIUM: Magnesium: 1.6 mg/dL — ABNORMAL LOW (ref 1.7–2.4)

## 2023-04-28 LAB — CBG MONITORING, ED: Glucose-Capillary: 98 mg/dL (ref 70–99)

## 2023-04-28 LAB — ETHANOL: Alcohol, Ethyl (B): 245 mg/dL — ABNORMAL HIGH (ref <10)

## 2023-04-28 LAB — LIPASE, BLOOD: Lipase: 53 U/L — ABNORMAL HIGH (ref 11–51)

## 2023-04-28 MED ORDER — MAGNESIUM SULFATE 2 GM/50ML IV SOLN
2.0000 g | Freq: Once | INTRAVENOUS | Status: AC
  Administered 2023-04-28: 2 g via INTRAVENOUS
  Filled 2023-04-28: qty 50

## 2023-04-28 MED ORDER — SENNOSIDES-DOCUSATE SODIUM 8.6-50 MG PO TABS: 1.0000 | ORAL_TABLET | Freq: Every evening | ORAL | Status: DC | PRN

## 2023-04-28 MED ORDER — OXYCODONE HCL 5 MG PO TABS: 5.0000 mg | ORAL_TABLET | ORAL | Status: DC | PRN

## 2023-04-28 MED ORDER — FOLIC ACID 1 MG PO TABS
1.0000 mg | ORAL_TABLET | Freq: Every day | ORAL | Status: DC
  Administered 2023-04-28 – 2023-05-08 (×11): 1 mg via ORAL
  Filled 2023-04-28 (×12): qty 1

## 2023-04-28 MED ORDER — POTASSIUM CHLORIDE CRYS ER 20 MEQ PO TBCR
40.0000 meq | EXTENDED_RELEASE_TABLET | Freq: Once | ORAL | Status: AC
  Administered 2023-04-28: 40 meq via ORAL
  Filled 2023-04-28: qty 2

## 2023-04-28 MED ORDER — IBUPROFEN 400 MG PO TABS
600.0000 mg | ORAL_TABLET | Freq: Four times a day (QID) | ORAL | Status: DC | PRN
  Administered 2023-04-29: 600 mg via ORAL
  Filled 2023-04-28: qty 2

## 2023-04-28 MED ORDER — LACTATED RINGERS IV SOLN: INTRAVENOUS | Status: AC

## 2023-04-28 MED ORDER — FAMOTIDINE 20 MG PO TABS: 20.0000 mg | ORAL_TABLET | Freq: Every day | ORAL | Status: DC

## 2023-04-28 MED ORDER — SUCRALFATE 1 GM/10ML PO SUSP
1.0000 g | Freq: Two times a day (BID) | ORAL | Status: DC
  Administered 2023-04-28 – 2023-05-12 (×29): 1 g via ORAL
  Filled 2023-04-28 (×29): qty 10

## 2023-04-28 MED ORDER — ACETAMINOPHEN 325 MG PO TABS: 650.0000 mg | ORAL_TABLET | Freq: Four times a day (QID) | ORAL | Status: DC | PRN

## 2023-04-28 MED ORDER — NICOTINE 21 MG/24HR TD PT24
21.0000 mg | MEDICATED_PATCH | Freq: Every day | TRANSDERMAL | Status: DC
  Administered 2023-04-28 – 2023-05-12 (×15): 21 mg via TRANSDERMAL
  Filled 2023-04-28 (×15): qty 1

## 2023-04-28 MED ORDER — FENTANYL CITRATE (PF) 100 MCG/2ML IJ SOLN
100.0000 ug | Freq: Once | INTRAMUSCULAR | Status: AC
  Administered 2023-04-28: 100 ug via INTRAVENOUS
  Filled 2023-04-28: qty 2

## 2023-04-28 MED ORDER — FUROSEMIDE 40 MG PO TABS
40.0000 mg | ORAL_TABLET | Freq: Every day | ORAL | Status: DC
  Administered 2023-04-29: 40 mg via ORAL
  Filled 2023-04-28: qty 1

## 2023-04-28 MED ORDER — BISACODYL 5 MG PO TBEC: 5.0000 mg | DELAYED_RELEASE_TABLET | Freq: Every day | ORAL | Status: DC | PRN

## 2023-04-28 MED ORDER — CARVEDILOL 3.125 MG PO TABS: 3.1250 mg | ORAL_TABLET | Freq: Two times a day (BID) | ORAL | Status: DC

## 2023-04-28 MED ORDER — SODIUM CHLORIDE 0.9% FLUSH
3.0000 mL | Freq: Two times a day (BID) | INTRAVENOUS | Status: DC
  Administered 2023-04-28 – 2023-05-12 (×26): 3 mL via INTRAVENOUS

## 2023-04-28 MED ORDER — IOHEXOL 300 MG/ML  SOLN
100.0000 mL | Freq: Once | INTRAMUSCULAR | Status: AC | PRN
  Administered 2023-04-28: 100 mL via INTRAVENOUS

## 2023-04-28 MED ORDER — ZOLPIDEM TARTRATE 5 MG PO TABS
5.0000 mg | ORAL_TABLET | Freq: Every evening | ORAL | Status: DC | PRN
  Administered 2023-04-29 – 2023-05-11 (×9): 5 mg via ORAL
  Filled 2023-04-28 (×9): qty 1

## 2023-04-28 MED ORDER — HYDRALAZINE HCL 20 MG/ML IJ SOLN
10.0000 mg | INTRAMUSCULAR | Status: DC | PRN
  Administered 2023-04-29: 10 mg via INTRAVENOUS
  Filled 2023-04-28: qty 1

## 2023-04-28 MED ORDER — OXYCODONE HCL 5 MG PO TABS
15.0000 mg | ORAL_TABLET | Freq: Four times a day (QID) | ORAL | Status: DC | PRN
  Administered 2023-04-28 – 2023-04-30 (×6): 15 mg via ORAL
  Filled 2023-04-28 (×6): qty 3

## 2023-04-28 MED ORDER — PANTOPRAZOLE SODIUM 40 MG IV SOLR
40.0000 mg | Freq: Two times a day (BID) | INTRAVENOUS | Status: DC
  Administered 2023-04-28 – 2023-05-03 (×11): 40 mg via INTRAVENOUS
  Filled 2023-04-28 (×12): qty 10

## 2023-04-28 MED ORDER — POTASSIUM CHLORIDE 10 MEQ/100ML IV SOLN
10.0000 meq | INTRAVENOUS | Status: AC
  Administered 2023-04-28 (×3): 10 meq via INTRAVENOUS
  Filled 2023-04-28 (×3): qty 100

## 2023-04-28 MED ORDER — THIAMINE HCL 100 MG/ML IJ SOLN
100.0000 mg | Freq: Every day | INTRAMUSCULAR | Status: DC
  Filled 2023-04-28: qty 2

## 2023-04-28 MED ORDER — ENOXAPARIN SODIUM 40 MG/0.4ML IJ SOSY: 40.0000 mg | PREFILLED_SYRINGE | INTRAMUSCULAR | Status: DC

## 2023-04-28 MED ORDER — ADULT MULTIVITAMIN W/MINERALS CH
1.0000 | ORAL_TABLET | Freq: Every day | ORAL | Status: DC
  Administered 2023-04-28 – 2023-05-12 (×15): 1 via ORAL
  Filled 2023-04-28 (×15): qty 1

## 2023-04-28 MED ORDER — FLEET ENEMA RE ENEM: 1.0000 | ENEMA | Freq: Once | RECTAL | Status: DC | PRN

## 2023-04-28 MED ORDER — SODIUM CHLORIDE 0.9 % IV SOLN: INTRAVENOUS | Status: DC

## 2023-04-28 MED ORDER — SODIUM CHLORIDE 0.9 % IV BOLUS
1000.0000 mL | Freq: Once | INTRAVENOUS | Status: AC
  Administered 2023-04-28: 1000 mL via INTRAVENOUS

## 2023-04-28 MED ORDER — CARVEDILOL 3.125 MG PO TABS
6.2500 mg | ORAL_TABLET | Freq: Two times a day (BID) | ORAL | Status: DC
  Administered 2023-04-28 – 2023-05-12 (×29): 6.25 mg via ORAL
  Filled 2023-04-28 (×29): qty 2

## 2023-04-28 MED ORDER — LEVALBUTEROL HCL 0.63 MG/3ML IN NEBU: 0.6300 mg | INHALATION_SOLUTION | Freq: Four times a day (QID) | RESPIRATORY_TRACT | Status: DC | PRN

## 2023-04-28 MED ORDER — THIAMINE MONONITRATE 100 MG PO TABS
100.0000 mg | ORAL_TABLET | Freq: Every day | ORAL | Status: DC
  Administered 2023-04-28 – 2023-05-12 (×15): 100 mg via ORAL
  Filled 2023-04-28 (×16): qty 1

## 2023-04-28 MED ORDER — ONDANSETRON HCL 4 MG/2ML IJ SOLN
4.0000 mg | Freq: Once | INTRAMUSCULAR | Status: AC
  Administered 2023-04-28: 4 mg via INTRAVENOUS
  Filled 2023-04-28: qty 2

## 2023-04-28 MED ORDER — SODIUM CHLORIDE 0.9% FLUSH: 3.0000 mL | Freq: Two times a day (BID) | INTRAVENOUS | Status: DC

## 2023-04-28 MED ORDER — ENOXAPARIN SODIUM 60 MG/0.6ML IJ SOSY
55.0000 mg | PREFILLED_SYRINGE | INTRAMUSCULAR | Status: DC
  Administered 2023-04-28 – 2023-05-02 (×5): 55 mg via SUBCUTANEOUS
  Filled 2023-04-28 (×6): qty 0.6

## 2023-04-28 MED ORDER — HYDROMORPHONE HCL 1 MG/ML IJ SOLN
0.5000 mg | INTRAMUSCULAR | Status: DC | PRN
  Administered 2023-04-28 – 2023-04-29 (×9): 1 mg via INTRAVENOUS
  Administered 2023-04-29: 0.5 mg via INTRAVENOUS
  Administered 2023-04-30 (×2): 1 mg via INTRAVENOUS
  Filled 2023-04-28 (×12): qty 1

## 2023-04-28 MED ORDER — ONDANSETRON HCL 4 MG/2ML IJ SOLN
4.0000 mg | Freq: Four times a day (QID) | INTRAMUSCULAR | Status: DC | PRN
  Administered 2023-04-28 – 2023-05-05 (×2): 4 mg via INTRAVENOUS
  Filled 2023-04-28 (×2): qty 2

## 2023-04-28 MED ORDER — ACETAMINOPHEN 650 MG RE SUPP: 650.0000 mg | Freq: Four times a day (QID) | RECTAL | Status: DC | PRN

## 2023-04-28 MED ORDER — LORAZEPAM 1 MG PO TABS
1.0000 mg | ORAL_TABLET | ORAL | Status: AC | PRN
  Administered 2023-04-28: 2 mg via ORAL
  Administered 2023-04-28 – 2023-04-30 (×8): 1 mg via ORAL
  Administered 2023-04-30 – 2023-05-01 (×3): 2 mg via ORAL
  Filled 2023-04-28: qty 1
  Filled 2023-04-28: qty 2
  Filled 2023-04-28 (×5): qty 1
  Filled 2023-04-28: qty 2
  Filled 2023-04-28 (×2): qty 1
  Filled 2023-04-28: qty 2
  Filled 2023-04-28: qty 1
  Filled 2023-04-28: qty 2

## 2023-04-28 MED ORDER — HYDROXYZINE HCL 25 MG PO TABS
25.0000 mg | ORAL_TABLET | Freq: Three times a day (TID) | ORAL | Status: DC | PRN
  Administered 2023-04-28 – 2023-05-07 (×17): 25 mg via ORAL
  Filled 2023-04-28 (×18): qty 1

## 2023-04-28 MED ORDER — SODIUM CHLORIDE 0.9 % IV BOLUS: 1000.0000 mL | Freq: Once | INTRAVENOUS | Status: DC

## 2023-04-28 MED ORDER — ONDANSETRON HCL 4 MG PO TABS: 4.0000 mg | ORAL_TABLET | Freq: Four times a day (QID) | ORAL | Status: DC | PRN

## 2023-04-28 MED ORDER — LORAZEPAM 2 MG/ML IJ SOLN
1.0000 mg | INTRAMUSCULAR | Status: AC | PRN
  Administered 2023-04-29: 1 mg via INTRAVENOUS
  Administered 2023-04-29 (×3): 2 mg via INTRAVENOUS
  Filled 2023-04-28 (×4): qty 1

## 2023-04-28 MED ORDER — IPRATROPIUM BROMIDE 0.02 % IN SOLN: 0.5000 mg | Freq: Four times a day (QID) | RESPIRATORY_TRACT | Status: DC | PRN

## 2023-04-28 MED ORDER — FAMOTIDINE 20 MG PO TABS
20.0000 mg | ORAL_TABLET | Freq: Two times a day (BID) | ORAL | Status: DC
  Administered 2023-04-28: 20 mg via ORAL
  Filled 2023-04-28: qty 1

## 2023-04-28 MED ORDER — POLYETHYLENE GLYCOL 3350 17 G PO PACK
17.0000 g | PACK | Freq: Every day | ORAL | Status: DC
  Administered 2023-04-28 – 2023-05-01 (×4): 17 g via ORAL
  Filled 2023-04-28 (×4): qty 1

## 2023-04-28 MED ORDER — POTASSIUM CHLORIDE 10 MEQ/100ML IV SOLN: 10.0000 meq | INTRAVENOUS | Status: DC

## 2023-04-28 MED ORDER — FUROSEMIDE 40 MG PO TABS: 40.0000 mg | ORAL_TABLET | Freq: Every day | ORAL | Status: DC

## 2023-04-28 NOTE — Assessment & Plan Note (Signed)
Status post ablation, monitoring

## 2023-04-28 NOTE — Assessment & Plan Note (Signed)
-   Holding statins due to elevated LFTs due to alcohol use abuse liver cirrhosis

## 2023-04-28 NOTE — ED Notes (Signed)
Patient transported to CT 

## 2023-04-28 NOTE — Plan of Care (Signed)
  Problem: Acute Rehab PT Goals(only PT should resolve) Goal: Pt Will Go Supine/Side To Sit Outcome: Progressing Flowsheets (Taken 04/28/2023 1400) Pt will go Supine/Side to Sit:  with modified independence  with supervision Goal: Patient Will Transfer Sit To/From Stand Outcome: Progressing Flowsheets (Taken 04/28/2023 1400) Patient will transfer sit to/from stand: with minimal assist Goal: Pt Will Transfer Bed To Chair/Chair To Bed Outcome: Progressing Flowsheets (Taken 04/28/2023 1400) Pt will Transfer Bed to Chair/Chair to Bed: with min assist Goal: Pt Will Ambulate Outcome: Progressing Flowsheets (Taken 04/28/2023 1400) Pt will Ambulate:  25 feet  with minimal assist  with moderate assist  with rolling walker   2:01 PM, 04/28/23 Ocie Bob, MPT Physical Therapist with [facility] [phone] office [phone] mobile phone

## 2023-04-28 NOTE — TOC Initial Note (Signed)
Transition of Care [facility]) - Initial/Assessment Note    Patient Details  Name: Timothy [name] MRN: [id number] Date of Birth: 1971/07/12  Transition of [facility]) CM/SW Contact:    Elliot Gault, LCSW Phone Number: 04/28/2023, 2:43 PM  Clinical Narrative:                  Pt admitted from home. Received TOC consult for SA treatment resources and HH/DME. PT recommending SNF rehab at dc.  Unable to complete full assessment with pt today. MD anticipating that pt will remain hospitalized for at least two more days.   SA treatment resource information added to pt's AVS. TOC will follow up with pt tomorrow to offer SNF referrals.   Expected Discharge Plan: Skilled Nursing Facility Barriers to Discharge: Continued Medical Work up   Patient Goals and CMS Choice            Expected Discharge Plan and Services In-house Referral: Clinical Social Work     Living arrangements for the past 2 months: Single Family Home                                      Prior Living Arrangements/Services Living arrangements for the past 2 months: Single Family Home Lives with:: Spouse Patient language and need for interpreter reviewed:: Yes            Current home services: DME Criminal Activity/Legal Involvement Pertinent to Current Situation/Hospitalization: No - Comment as needed  Activities of Daily Living      Permission Sought/Granted                  Emotional Assessment       Orientation: : Oriented to Self, Oriented to Place, Oriented to Situation, Oriented to  Time Alcohol / Substance Use: Alcohol Use Psych Involvement: No (comment)  Admission diagnosis:  Hypokalemia [E87.6] Abdominal pain [R10.9] Patient Active Problem List   Diagnosis Date Noted   Abdominal pain 04/28/2023   Alcoholic cirrhosis of liver with ascites (HCC) 04/28/2023   Hypomagnesemia 04/28/2023   Hematemesis with nausea 09/17/2022   Barrett's esophagus without dysplasia 02/09/2021    PUD (peptic ulcer disease)/Gastric Ulcers---etoh and Nsaid related 12/20/2020   Alcohol withdrawal/DTs 12/19/2020   Non-intractable vomiting    Dysphagia    Elevated LFTs    Hyperbilirubinemia    Chronic combined systolic (congestive) and diastolic (congestive) heart failure (HCC) 05/08/2016   Acute CHF (congestive heart failure) (HCC) 04/18/2016   ETOH abuse 04/18/2016   RBBB 04/18/2016   Cardiomyopathy (HCC) 04/18/2016   Family history of coronary artery disease in father 04/18/2016   Dyslipidemia 04/18/2016   Elevated brain natriuretic peptide (BNP) level    Acute on chronic diastolic heart failure (HCC)    Interstitial edema    Hemoptysis 04/11/2016   Orthopnea 04/11/2016   Elevated troponin I level    Stroke (HCC) 10/28/2014   Hypertensive emergency 10/28/2014   Left sided numbness 10/28/2014   Hepatomegaly 10/03/2014   GERD (gastroesophageal reflux disease) 07/13/2013   Hemorrhoids 07/13/2013   Abdominal pain, chronic, right upper quadrant 05/03/2013   Bowel habit changes 05/03/2013   FH: colon cancer 05/03/2013   Rectal bleeding 05/03/2013   Hypokalemia 03/08/2013   History of Wolff-Parkinson-White syndrome 03/08/2013   Malignant hypertension 03/08/2013   Polycythemia 03/08/2013   Tobacco abuse 03/08/2013   Chronic anxiety 03/08/2013   Leukocytosis 03/08/2013   Laceration of  arm 03/08/2013   Chest tightness 03/08/2013   CAD (coronary artery disease) 08/11/2012   Smoking 08/11/2012   NIGHT SWEATS 10/23/2010   Anxiety state 12/23/2008   Essential hypertension 12/23/2008   SYNCOPE 12/23/2008   Palpitations 12/23/2008   TACHYCARDIA, HX OF 12/23/2008   PCP:  Elfredia Nevins, MD Pharmacy:   [facility] - [place], [place] - [phone] [place] [address] Phone: [phone] Fax: [phone]  [id number] [place], [place] - [address] AT [facility] [address] & [address] [place] [address] Phone: [phone] Fax: [phone]     Social Determinants of Health (SDOH) Social History: SDOH Screenings   Food Insecurity: No Food Insecurity (09/18/2022)  Housing: Low Risk  (09/18/2022)  Transportation Needs: No Transportation Needs (09/18/2022)  Utilities: Not At Risk (09/18/2022)  Tobacco Use: High Risk (10/15/2022)   SDOH Interventions:     Readmission Risk Interventions     No data to display

## 2023-04-28 NOTE — Assessment & Plan Note (Signed)
-    Potassium of 2.4, replating IV -Due to nausea vomiting, liver cirrhosis alcohol use/abuse

## 2023-04-28 NOTE — ED Triage Notes (Signed)
Pt bib [facility] from home c/o abdominal pain and swelling x1 year. States his "belly button popped out" and his pain is worse than usual today.

## 2023-04-28 NOTE — Assessment & Plan Note (Signed)
-   Monitor I's and O's, -Limit IV fluid resuscitation -Last echo 12/19/2020:  [id number] 5-50%, mildly decreased LV function moderate LV H, grade 1 diastolic dysfunction impaired relaxation

## 2023-04-28 NOTE — ED Notes (Signed)
I asked pt if he would be able to walk around with pulse ox and he stated that he was dizzy and felt to groggy to do so.

## 2023-04-28 NOTE — Assessment & Plan Note (Signed)
-   Avoid nephrotoxin, including statins -Monitoring LFTs -treating alcohol use and abuse    Latest Ref Rng & Units 04/28/2023    3:46 AM 09/21/2022   12:25 AM 09/20/2022   12:30 AM  Hepatic Function  Total Protein 6.5 - 8.1 g/dL 7.3  7.5  7.0   Albumin 3.5 - 5.0 g/dL 2.6  2.7  2.3   AST 15 - 41 U/L 174  91  60   ALT 0 - 44 U/L 42  34  23   Alk Phosphatase 38 - 126 U/L 217  187  174   Total Bilirubin 0.3 - 1.2 mg/dL 3.6  1.3  1.2

## 2023-04-28 NOTE — Assessment & Plan Note (Signed)
-   CIWA protocol -As needed Ativan

## 2023-04-28 NOTE — Assessment & Plan Note (Signed)
-  Monitoring closely, initiate CIWA protocol -Will replace thiamine, folate,

## 2023-04-28 NOTE — H&P (Signed)
History and Physical   Patient: Timothy [name]                            PCP: Elfredia Nevins, MD                    DOB: November 01, 1970            DOA: 04/28/2023 WRU:[id number]             DOS: 04/28/2023, 12:23 PM  Elfredia Nevins, MD  Patient coming from:   HOME  I have personally reviewed patient's medical records, in electronic medical records, including:  [facility], and care everywhere.    Chief Complaint:   Chief Complaint  Patient presents with   Abdominal Pain    History of present illness:    Timothy [name] is a 52 year old male with history of HTN, Barrett esophagus, PUD, WPW status post ablation, NICM, alcohol abuse, and liver disease who presents with worsening abdominal distention and pain.  For past 2-3 days.  Reporting his bellybutton is popping out now.  Voiding and nonbilious nonbloody emesis difficulty having bowel movements.   ED: Blood pressure (!) 153/88, pulse (!) 111, temperature 98.5 F (36.9 C), resp. rate 11, height 6' (1.829 m), weight 111.1 kg, SpO2 95%.  He is found to have potassium of 2.4, AST 179, total bilirubin 3.6, platelets 126, and prolonged QT interval.   CT demonstrates progressive cirrhotic changes and new small volume ascites.  He was treated with fentanyl, magnesium and potassium, Zofran, and a liter of saline in the ED.   ED requesting admission for further electrolyte correction.     Patient Denies having: Fever, Chills, Cough, SOB, Chest Pain,  headache, dizziness, lightheadedness,  Dysuria, Joint pain, rash, open wounds     Review of Systems: As per HPI, otherwise 10 point review of systems were negative.   ----------------------------------------------------------------------------------------------------------------------  Allergies  Allergen Reactions   Morphine Other (See Comments)    Makes overly sleepy    Home MEDs:  Prior to Admission medications   Medication Sig Start Date End Date Taking? Authorizing  Provider  albuterol (PROVENTIL) (2.5 MG/3ML) 0.083% nebulizer solution Take 2.5 mg by nebulization every 4 (four) hours as needed. 07/17/21   [provider]  albuterol (VENTOLIN HFA) 108 (90 Base) MCG/ACT inhaler Inhale 2 puffs into the lungs every 6 (six) hours as needed for wheezing or shortness of breath.    [provider]  carvedilol (COREG) 3.125 MG tablet Take 1 tablet (3.125 mg total) by mouth 2 (two) times daily with a meal. 09/21/22   Leroy Sea, MD  chlordiazePOXIDE (LIBRIUM) 5 MG capsule Take 1 pill twice a day for 2 days then 1 pill once a day for 2 days and stop 09/21/22   Leroy Sea, MD  Cholecalciferol (VITAMIN D3) 25 MCG (1000 UT) CAPS Take 2 capsules by mouth every evening.    [provider]  famotidine (PEPCID) 20 MG tablet Take 20 mg by mouth daily.    [provider]  folic acid (FOLVITE) 1 MG tablet Take 1 tablet (1 mg total) by mouth daily. 12/21/20   Shon Hale, MD  furosemide (LASIX) 40 MG tablet Take 1 tablet (40 mg total) by mouth daily. 09/21/22   Leroy Sea, MD  hydrOXYzine (ATARAX/VISTARIL) 25 MG tablet Take 1 tablet (25 mg total) by mouth 3 (three) times daily as needed for anxiety or  nausea. 12/20/20   Shon Hale, MD  Multiple Vitamin (MULTIVITAMIN WITH MINERALS) TABS tablet Take 1 tablet by mouth daily. 12/21/20   Shon Hale, MD  nicotine (NICODERM CQ - DOSED IN MG/24 HOURS) 21 mg/24hr patch Place 1 patch (21 mg total) onto the skin daily. 09/21/22   Leroy Sea, MD  nystatin cream (MYCOSTATIN) Apply 1 Application topically daily as needed for dry skin. 09/10/21   [provider]  oxyCODONE (ROXICODONE) 15 MG immediate release tablet Take 15 mg by mouth every 6 (six) hours as needed for pain. 03/12/16   [provider]  sucralfate (CARAFATE) 1 GM/10ML suspension Take 1 g by mouth 2 (two) times daily. 06/23/22   [provider]  thiamine (VITAMIN B-1) 100 MG tablet Take 1 tablet  (100 mg total) by mouth daily. 09/21/22   Leroy Sea, MD  dexlansoprazole (DEXILANT) 60 MG capsule Take 1 capsule (60 mg total) by mouth daily. 12/25/20 02/09/21  Corbin Ade, MD    PRN MEDs: bisacodyl, hydrALAZINE, HYDROmorphone (DILAUDID) injection, hydrOXYzine, ibuprofen, ipratropium, levalbuterol, LORazepam **OR** LORazepam, ondansetron **OR** ondansetron (ZOFRAN) IV, oxyCODONE, senna-docusate, sodium phosphate, zolpidem  Past Medical History:  Diagnosis Date   Anxiety    Arthritis    CAD (coronary artery disease) 2007   50% stenosis small diagonal   Central sleep apnea    Chronic pain    since 1998 has been on chronic pain medication   Fatty liver    GERD (gastroesophageal reflux disease)    Hemorrhoids    Hepatomegaly 10/03/2014   HTN (hypertension)    Palpitations    Syncope    Tachycardia    Tobacco abuse    Tubular adenoma    Wolff-Parkinson-White (WPW) syndrome    says was cured with ablation    Past Surgical History:  Procedure Laterality Date   ADENOIDECTOMY     APPENDECTOMY     ATRIAL ABLATION SURGERY     AV NODE ABLATION     BIOPSY N/A 05/12/2013   Procedure: BIOPSY;  Surgeon: Corbin Ade, MD;  Location: [facility];  Service: Endoscopy;  Laterality: N/A;   BIOPSY  12/20/2020   Procedure: BIOPSY;  Surgeon: Corbin Ade, MD;  Location: [facility];  Service: Endoscopy;;   BIOPSY  09/18/2022   Procedure: BIOPSY;  Surgeon: Tressia Danas, MD;  Location: [facility] ENDOSCOPY;  Service: Gastroenterology;;   CARDIAC CATHETERIZATION N/A 05/08/2016   Procedure: Right/Left Heart Cath and Coronary Angiography;  Surgeon: Peter M Swaziland, MD;  Location: [facility] INVASIVE CV LAB;  Service: Cardiovascular;  Laterality: N/A;   CARDIAC ELECTROPHYSIOLOGY STUDY AND ABLATION     SA node ablation   COLONOSCOPY     age 72   COLONOSCOPY WITH PROPOFOL N/A 05/12/2013   Dr. Jena Gauss- hemorrhoids, tubular adenoma   ESOPHAGEAL DILATION  12/20/2020   Procedure: ESOPHAGEAL DILATION;  Surgeon:  Corbin Ade, MD;  Location: [facility];  Service: Endoscopy;;   ESOPHAGEAL DILATION N/A 12/20/2020   Procedure: ESOPHAGEAL DILATION;  Surgeon: Corbin Ade, MD;  Location: [facility];  Service: Endoscopy;  Laterality: N/A;   ESOPHAGOGASTRODUODENOSCOPY (EGD) WITH PROPOFOL N/A 05/12/2013   Dr. Jena Gauss- hiatal hernia, erosive reflux esophagitis   ESOPHAGOGASTRODUODENOSCOPY (EGD) WITH PROPOFOL N/A 12/20/2020   Procedure: ESOPHAGOGASTRODUODENOSCOPY (EGD) WITH PROPOFOL;  Surgeon: Corbin Ade, MD;  Location: [facility];  Service: Endoscopy;  Laterality: N/A;   ESOPHAGOGASTRODUODENOSCOPY (EGD) WITH PROPOFOL N/A 09/18/2022   Procedure: ESOPHAGOGASTRODUODENOSCOPY (EGD) WITH PROPOFOL;  Surgeon: Orvan Falconer,  Cala Bradford, MD;  Location: [facility] ENDOSCOPY;  Service: Gastroenterology;  Laterality: N/A;   HAND SURGERY     HEMORRHOID BANDING  06/22/13   POLYPECTOMY N/A 05/12/2013   Procedure: POLYPECTOMY;  Surgeon: Corbin Ade, MD;  Location: [facility];  Service: Endoscopy;  Laterality: N/A;   SHOULDER SURGERY       reports that he has been smoking cigarettes. He has a 12.5 pack-year smoking history. He has never used smokeless tobacco. He reports current alcohol use. He reports current drug use. Drug: Marijuana.   Family History  Problem Relation Age of Onset   Coronary artery disease Father        cabg   Colon cancer Father        age 13, chemo/surgery   Breast cancer Mother    Hypertension Mother     Physical Exam:   Vitals:   04/28/23 1100 04/28/23 1137 04/28/23 1140 04/28/23 1200  BP: (!) 147/91 (!) 156/95 (!) 156/95 (!) 148/92  Pulse: 94 96 94 96  Resp: 20 20 20 20   Temp:      TempSrc:      SpO2: 94% 93% 94% 93%  Weight:      Height:       Constitutional: NAD, calm, comfortable Eyes: PERRL, lids and conjunctivae normal ENMT: Mucous membranes are moist. Posterior pharynx clear of any exudate or lesions.Normal dentition.  Neck: normal, supple, no masses, no thyromegaly Respiratory:  clear to auscultation bilaterally, no wheezing, no crackles. Normal respiratory effort. No accessory muscle use.  Cardiovascular: Regular rate and rhythm, no murmurs / rubs / gallops. No extremity edema. 2+ pedal pulses. No carotid bruits.  Abdomen: Soft, distended, positive fluid shift, hepatomegaly, hypoactive bowel sounds Musculoskeletal: no clubbing / cyanosis. No joint deformity upper and lower extremities. Good ROM, no contractures. Normal muscle tone.  Neurologic: CN II-XII grossly intact. Sensation intact, DTR normal. Strength 5/5 in all 4.  Psychiatric: Normal judgment and insight. Alert and oriented x 3. Normal mood.  Skin: no rashes, lesions, ulcers. No induration Decubitus/ulcers:  Wounds: per nursing documentation         Labs on admission:    I have personally reviewed following labs and imaging studies  CBC: Recent Labs  Lab 04/28/23 0346  WBC 9.2  NEUTROABS 6.7  HGB 11.7*  HCT 33.8*  MCV 92.1  PLT 126*   Basic Metabolic Panel: Recent Labs  Lab 04/28/23 0346 04/28/23 0945  NA 132* 131*  K 2.4* 2.6*  CL 87* 90*  CO2 31 29  GLUCOSE 110* 93  BUN 7 7  CREATININE 1.19 1.09  CALCIUM 8.2* 7.8*  MG 1.6*  --    GFR: Estimated Creatinine Clearance: 99.7 mL/min (by C-G formula based on SCr of 1.09 mg/dL). Liver Function Tests: Recent Labs  Lab 04/28/23 0346 04/28/23 0945  AST 174* 157*  ALT 42 39  ALKPHOS 217* 220*  BILITOT 3.6* 3.9*  PROT 7.3 6.8  ALBUMIN 2.6* 2.4*   Recent Labs  Lab 04/28/23 0346  LIPASE 53*      Component Value Date/Time   COLORURINE YELLOW 04/11/2016 1044   APPEARANCEUR CLEAR 04/11/2016 1044   LABSPEC 1.011 04/11/2016 1044   PHURINE 7.5 04/11/2016 1044   GLUCOSEU NEGATIVE 04/11/2016 1044   HGBUR NEGATIVE 04/11/2016 1044   BILIRUBINUR NEGATIVE 04/11/2016 1044   KETONESUR NEGATIVE 04/11/2016 1044   PROTEINUR NEGATIVE 04/11/2016 1044   UROBILINOGEN 0.2 10/29/2014 0937   NITRITE NEGATIVE 04/11/2016 1044   LEUKOCYTESUR  NEGATIVE 04/11/2016 1044  Last A1C:  Lab Results  Component Value Date   HGBA1C 5.0 09/18/2022     Radiologic Exams on Admission:   CT ABDOMEN PELVIS W CONTRAST  Result Date: 04/28/2023 CLINICAL DATA:  Bowel obstruction suspected. Acute, nonlocalized abdominal pain. EXAM: CT ABDOMEN AND PELVIS WITH CONTRAST TECHNIQUE: Multidetector CT imaging of the abdomen and pelvis was performed using the standard protocol following bolus administration of intravenous contrast. RADIATION DOSE REDUCTION: This exam was performed according to the departmental dose-optimization program which includes automated exposure control, adjustment of the mA and/or kV according to patient size and/or use of iterative reconstruction technique. CONTRAST:  OMNIPAQUE IOHEXOL 300 MG/ML  SOLN COMPARISON:  09/24/2021 FINDINGS: Lower chest:  Coronary atherosclerosis. Hepatobiliary: Severe hepatic steatosis. Lobulated liver surface with newly enlarged caudate and further widened fissures.No evidence of biliary obstruction or stone. Pancreas: Unremarkable. Spleen: Unremarkable. Adrenals/Urinary Tract: Negative adrenals. No hydronephrosis or stone. Left renal hilar and cortical cysts with simple appearance measuring up to 3.2 cm at the renal hilum. Unremarkable bladder. Stomach/Bowel: No obstruction. No appendicitis other visible bowel inflammation. Vascular/Lymphatic: No acute vascular abnormality. Scattered atheromatous calcification. No mass or adenopathy. Reproductive:No pathologic findings. Other: New small volume ascites distributed throughout the abdomen including at the small umbilical hernia. Musculoskeletal: No acute abnormalities. Generalized degeneration with mild scoliosis. IMPRESSION: 1. Severe hepatic steatosis with progressive findings of cirrhosis since 2023. Small volume ascites is newly seen. 2. Extensive atherosclerosis. Electronically Signed   By: Tiburcio Pea M.D.   On: 04/28/2023 05:30    EKG:    Independently reviewed.  Orders placed or performed during the hospital encounter of 04/28/23   EKG 12-Lead   EKG 12-Lead   EKG 12-Lead   ---------------------------------------------------------------------------------------------------------------------------------------    Assessment / Plan:   Principal Problem:   Abdominal pain Active Problems:   Alcoholic cirrhosis of liver with ascites (HCC)   Anxiety state   Barrett's esophagus without dysplasia   Hypomagnesemia   Essential hypertension   Hypokalemia   History of Wolff-Parkinson-White syndrome   GERD (gastroesophageal reflux disease)   Acute CHF (congestive heart failure) (HCC)   ETOH abuse   Dyslipidemia   Elevated LFTs   Alcohol withdrawal/DTs   Assessment and Plan: * Abdominal pain - Due to progressive liver cirrhosis, ascites, distention -Monitor closely, continue as needed IV/p.o. analgesics -Consulted IR for possible thoracentesis -CT scan consistent with thoracentesis, diabetes  Alcoholic cirrhosis of liver with ascites (HCC) - CT scan reviewed, progressive liver cirrhosis, with a small amount of ascites -Consulting GI -Consulting IR for possible thoracentesis  Hypomagnesemia - Replacing with 2 g of IV magnesium  Barrett's esophagus without dysplasia - Monitoring continue PPI  Anxiety state - CIWA protocol -As needed Ativan  Alcohol withdrawal/DTs -Monitoring closely, initiate CIWA protocol -Will replace thiamine, folate,  Elevated LFTs - Avoid nephrotoxin, including statins -Monitoring LFTs -treating alcohol use and abuse    Latest Ref Rng & Units 04/28/2023    3:46 AM 09/21/2022   12:25 AM 09/20/2022   12:30 AM  Hepatic Function  Total Protein 6.5 - 8.1 g/dL 7.3  7.5  7.0   Albumin 3.5 - 5.0 g/dL 2.6  2.7  2.3   AST 15 - 41 U/L 174  91  60   ALT 0 - 44 U/L 42  34  23   Alk Phosphatase 38 - 126 U/L 217  187  174   Total Bilirubin 0.3 - 1.2 mg/dL 3.6  1.3  1.2       Dyslipidemia -  Holding statins due to elevated LFTs due to alcohol use abuse liver cirrhosis  ETOH abuse -Has been counseled regarding alcohol use/abuse, now developing liver cirrhosis, ascites -Patient has been initiated on CIWA protocol  Acute CHF (congestive heart failure) (HCC) - Monitor I's and O's, -Limit IV fluid resuscitation -Last echo 12/19/2020:  EJF40 5-50%, mildly decreased LV function moderate LV H, grade 1 diastolic dysfunction impaired relaxation  GERD (gastroesophageal reflux disease) Continue PPI  History of Wolff-Parkinson-White syndrome Status post ablation, monitoring  Hypokalemia -  Potassium of 2.4, replating IV -Due to nausea vomiting, liver cirrhosis alcohol use/abuse  Essential hypertension - Monitoring closely -Restarted home medication, with a as needed IV hydralazine       Consults called:  GI  -------------------------------------------------------------------------------------------------------------------------------------------- DVT prophylaxis:  Place TED hose Start: 04/28/23 0740 TED hose Start: 04/28/23 0739 SCDs Start: 04/28/23 0739   Code Status:   Code Status: Full Code   Admission status: Patient will be admitted as Inpatient, with a greater than 2 midnight length of stay. Level of care: Stepdown   Family Communication:  none at bedside  (The above findings and plan of care has been discussed with patient in detail, the patient expressed understanding and agreement of above plan)  --------------------------------------------------------------------------------------------------------------------------------------------------  Disposition Plan:  Anticipated 1-2 days Status is: Inpatient Remains inpatient appropriate because: Needed electrolyte abnormalities correction with IV, treating alcohol withdrawal, liver cirrhosis and  ascites     ----------------------------------------------------------------------------------------------------------------------------------------------------  Time spent:  43  Min.  Was spent seeing and evaluating the patient, reviewing all medical records, drawn plan of care.  SIGNED: Kendell Bane, MD, FHM. FAAFP. [place] [facility], Pager  (Please use [email or web address] to page/ or secure chat through epic) If 7PM-7AM, please contact night-coverage [email or web address],  04/28/2023, 12:23 PM

## 2023-04-28 NOTE — Plan of Care (Signed)
  Problem: Acute Rehab OT Goals (only OT should resolve) Goal: Pt. Will Perform Grooming Flowsheets (Taken 04/28/2023 0904) Pt Will Perform Grooming:  with contact guard assist  standing Goal: Pt. Will Perform Upper Body Bathing Flowsheets (Taken 04/28/2023 0904) Pt Will Perform Upper Body Bathing:  with modified independence  sitting Goal: Pt. Will Perform Lower Body Bathing Flowsheets (Taken 04/28/2023 0904) Pt Will Perform Lower Body Bathing:  with min assist  sitting/lateral leans Goal: Pt. Will Perform Upper Body Dressing Flowsheets (Taken 04/28/2023 0904) Pt Will Perform Upper Body Dressing:  with modified independence  sitting Goal: Pt. Will Perform Lower Body Dressing Flowsheets (Taken 04/28/2023 0904) Pt Will Perform Lower Body Dressing:  with min assist  sitting/lateral leans Goal: Pt. Will Transfer To Toilet Flowsheets (Taken 04/28/2023 [phone]) Pt Will Transfer to Toilet:  with contact guard assist  ambulating Goal: Pt. Will Perform Toileting-Clothing Manipulation Flowsheets (Taken 04/28/2023 0904) Pt Will Perform Toileting - Clothing Manipulation and hygiene:  with modified independence  sitting/lateral leans Goal: Pt/Caregiver Will Perform Home Exercise Program Flowsheets (Taken 04/28/2023 [phone]) Pt/caregiver will Perform Home Exercise Program:  Increased ROM  Increased strength  Right Upper extremity  Left upper extremity  Independently    OT, MOT

## 2023-04-28 NOTE — Assessment & Plan Note (Signed)
-  Has been counseled regarding alcohol use/abuse, now developing liver cirrhosis, ascites -Patient has been initiated on CIWA protocol

## 2023-04-28 NOTE — Hospital Course (Addendum)
Timothy [name] is a 52 year old male with history of HTN, Barrett esophagus, PUD, WPW status post ablation, NICM, alcohol abuse, and liver disease who presents with worsening abdominal distention and pain. 08/13: to ED. Mult electrolyte derangement, low Plt. CT of the abdomen pelvis with IV contrast showed presence of new cirrhotic changes with small volume of ascites. Admitted w/ decompensated cirrhosis/ascites. GI consult. Plan paracentesis. Plan start furosemide/spironolactone when electrolytes are improved 08/14: Ultrasound today reveals trace perihepatic ascites insufficient for tap. Subsequent plain films revealed nonspecific bowel gas pattern with some increase in colonic air. No evidence of obstruction, impaction or free air. Plan liver doppler.  08/15: Liver doppler done. Add spironolactone 100mg  daily/continue lasix 40mg  daily. More aggressive bowel regimen given no signs of obstruction and narcotic use. Will start Linzess daily. Can titrate accordingly.  08/16: Liver Doppler studies with severe steatosis, portal hypertension and patent portal vein. Start lactulose 10 g twice daily. Holding diuretics. Repeated CT to eval L flank bruise and drop Hgb, no hematoma 08/17: still off lasix/spiro, increase lactulose to 20 bid. Stable to transfer out of stepdown. Follow GI recs - consider repeat US / attempt paracentesis?  08/18: BP and K improved, restart lasix/spiro today. Of note, pt reported concern for when he was down for his MRI, he was moved roughly by staff and this maybe caused the bruising,   05/04/2023: Improving, low sodium, potassium..  Sodium potassium repleted 05/05/2023: Reporting of increased abdominal pain, distention, feeling weak, constipated, 05/06/2023: Complain of generalized weaknesses, generalized aches and pain, still having abdominal distention 05/07/2023: Lethargic, complaining of abdominal pain, hemodynamically stable, much improved labs -pending insurance authorization  and approval for discharge to SNF 05/08/23--pt sitting up in chair.  Had 3 loose BM last 24 hours.  Complains abd pain.  Abd distended.  KUB ordered 05/09/23--pt in better spirits.  Had 4 BMs.  KUB shows stable bowel dilatation.  No vomiting 05/10/23--ambulated hall with walker 50 feet.  3 BMs last 24 hours.  Abd distended but shows some improvement 05/11/23--ambulated hall 50 feet.  3 BMs.  Abdomen less distended.  Tolerating diet Paitent is in good spirits.  Had 4 BMs without blood.  Abdomen distension slowly improving.  Tolerating diet       ASSESSMENT & PLAN:  Decompensated Alcoholic Liver Cirrhosis  / Generalized Abdominal pain Progressive alcoholic liver cirrhosis and severe steatosis resulting in portal HTN, ascites Hyponatremia d/t liver disease,  Thrombocytopenia d/t liver disease   -Repeat ultrasound, and CT scan did not reveal enough fluid for paracentesis) -MELD score of 25, Maddrey DF up to 38-prednisolone started on 05/06/23 -Liver Doppler without evidence of PVT.   No signs of SBP.  GI following -appreciated discussed with Dr. Tasia Catchings Coreg 6.25 mg bid Lasix  40 mg po bid>>hold 05/09/23 until K improved Spiro at 50 mg po daily >>hold 05/09/23 until K improved Lactulose 30g bid  2g sodium diet  Absolute EtOH cessation  Needs outpatient colonoscopy for surveillance purposes. Can consider EGD at the same time to evaluate for varices as well as to screen for Barrett's esophagus.  05/05/23 CT scan of abdomenDiffuse colonic distention with air-fluid, possible ileus, liver cirrhosis and fatty infiltration, trace ascites, body wall edema, benign renal cyst 3.2 cm:  restart lasix/spiro 8/27 Recheck potassium and magnesium levels on 8/29 or 8/30 and adjust KCl and mag ox supplementation to keep K > 4.0 and mag > 2.0  Ileus Complicated by opiate dependence,  Lactulose as above  Linzess 290 mcg daily  8/23 KUB--stable bowel dilatation Optimize electrolytes Ambulate at least once  daily Continues to improve Now having 3-4 BMs daily with less abdominal distension.  Tolerating diet  Anemia stable no evidence of active GI bleeding.  Previous EGD with ulcerative esophagitis.  Continue to monitor hemoglobin no plans for inpatient endoscopic evaluation  Continue PPI twice daily.   Bilateral flank bruising No retroperitoneal hematoma on CT 8/20 Monitor on physical exam Monitor Hgb--overall stable   Hypomagnesemia /Hypokalemia/hyponatremia/hypophosphatemia Replace as needed Much improved  Monitor BMP, Mg, phos discontinue sodium supplements--contributing to his fluid retention   Barrett's esophagus without dysplasia GERD Continue PPI twice daily.   Alcohol Abuser/History of DTs Monitoring closely, following CIWA protocol continue daily thiamine, folate replacement  Absolute EtOH cessation    Opioid dependence - Chronic Pain  Opioid induced chronic constipation  Pt reports being on opioids since 1990s and has withdrawal symptoms if not receiving resume his home oxycodone 15 mg Q6h prn, may consider increase dose as wean off IV meds, plan taper this as well outpatient  PDMP reviewed  Oxycodone 15 mg, #120, last refill 03/31/23   Chronic HFmrEF Last echo 12/19/2020:  LVEF 45-50%, mildly decreased LV function moderate LVH, grade 1 diastolic dysfunction impaired relaxation Monitor I's and O's Limit IV fluid resuscitation Repeat echo  History of Wolff-Parkinson-White syndrome Status post ablation, monitoring  H/o hypertension -hypotensive Was persistently hypotensive, medications were held, BP is improving Now tolerating coreg 6.25 mg bid   Dyslipidemia Holding statins due to elevated LFTs due to alcohol use abuse liver cirrhosis     Consultants:  Gastroenterology   Procedures: None    DVT prophylaxis: SCD d/t low Plt Pertinent IV fluids/nutrition: no continuous IV fluids  Central lines / invasive devices: none  Code Status: FULL CODE ACP  documentation reviewed: 05/04/23 none on file   Current Admission Status: inpatient TOC needs / Dispo plan: SNF rehab when medically stable   Barriers to discharge / significant pending items:  Not medically stable
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
## 2023-04-28 NOTE — Assessment & Plan Note (Signed)
-   CT scan reviewed, progressive liver cirrhosis, with a small amount of ascites -Consulting GI -Consulting IR for possible thoracentesis

## 2023-04-28 NOTE — Assessment & Plan Note (Signed)
-   Due to progressive liver cirrhosis, ascites, distention -Monitor closely, continue as needed IV/p.o. analgesics -Consulted IR for possible thoracentesis -CT scan consistent with thoracentesis, diabetes

## 2023-04-28 NOTE — Assessment & Plan Note (Signed)
-   Monitoring closely -Restarted home medication, with a as needed IV hydralazine

## 2023-04-28 NOTE — Consult Note (Signed)
@LOGO @   Referring Provider: [facility]  Primary Care Physician:  Elfredia Nevins, MD Primary Gastroenterologist:  Dr. Jena Gauss  Date of Admission: 04/28/23 Date of Consultation: 04/28/23  Reason for Consultation:  Cirrhosis, abdominal pain  HPI:  Timothy [name] is a 52 y.o. year old male with history of CAD, WPW s/p ablation, chronic pain, diastolic heart failure, GERD, HTN, adenomatous colon polyp, gastric ulcers, who presented to the emergency room 8/13 with chief complaint of abdominal pain and distention, vomiting, difficulty with bowel movements.  In the ED, he was found to be tachycardic and hypertensive, but hemodynamically stable. Labs remarkable for hemoglobin 11.7, sodium 132, potassium 2.4, albumin 2.6, AST 174, ALT 42, alk phos 217, total bilirubin 3.6, magnesium 1.6. EtOH 245.  CT A/P with contrast with severe hepatic steatosis with progressive findings of cirrhosis, small volume ascites, Extensive atherosclerosis.  He has been given IV fluids, potassium and magnesium replacement.   Consult:   Abdominal distension x 1 week. Associated abdominal pain in the lower/mid abdomen that is constant but worsened by movement. Umbilicus is now popped out, can hardly pass gas, no appetite. Last BM was 3-4 days ago. Has had trouble with constipation for about 1 year. Reports he fell on his right side about 1 year ago and hasn't been right since. Bowels tend to move once every 2-3 days but can go longer. Takes dulcolax as needed, enema as needed, and/or vegetable laxative. Had black stool 3-4 days ago and has never had that before. Had incontinent at that time as well. Stool was mushy. Had 2 Bms. Also had 2 episodes of bright red blood per rectum following the black stool. Has nausea every morning and vomits at least once a day every day since January. Hasn't vomited blood since January. Not much heartburn. Not on a PPI. Takes liquid carafate as needed for nausea.   Notes forgetfulness.  No overt confusion.   For the last 3-4 months, has been drinking 1 "tall boy" a day, 22 oz. Used to drink 3-4 a day.   Occasional Motrin. Occasional BC powder.    Last EGD 09/18/2022 for hematemesis with grade D reflux esophagitis with ulceration, recently bleeding, gastritis biopsied.  No esophageal or gastric varices.  Recommended repeat EGD in 8 weeks with Dr. Jena Gauss.  Pathology with gastric antral and oxyntic mucosa with no specific histopathologic changes, H. pylori negative.  Last colonoscopy 05/12/2013: Hemorrhoids, colonic and rectal polyps removed.  Pathology with tubular adenomas.  Recommended 5-year surveillance.   Past Medical History:  Diagnosis Date   Anxiety    Arthritis    CAD (coronary artery disease) 2007   50% stenosis small diagonal   Central sleep apnea    Chronic pain    since 1998 has been on chronic pain medication   Fatty liver    GERD (gastroesophageal reflux disease)    Hemorrhoids    Hepatomegaly 10/03/2014   HTN (hypertension)    Palpitations    Syncope    Tachycardia    Tobacco abuse    Tubular adenoma    Wolff-Parkinson-White (WPW) syndrome    says was cured with ablation    Past Surgical History:  Procedure Laterality Date   ADENOIDECTOMY     APPENDECTOMY     ATRIAL ABLATION SURGERY     AV NODE ABLATION     BIOPSY N/A 05/12/2013   Procedure: BIOPSY;  Surgeon: Corbin Ade, MD;  Location: [facility];  Service: Endoscopy;  Laterality: N/A;   BIOPSY  12/20/2020   Procedure: BIOPSY;  Surgeon: Corbin Ade, MD;  Location: [facility];  Service: Endoscopy;;   BIOPSY  09/18/2022   Procedure: BIOPSY;  Surgeon: Tressia Danas, MD;  Location: [facility] ENDOSCOPY;  Service: Gastroenterology;;   CARDIAC CATHETERIZATION N/A 05/08/2016   Procedure: Right/Left Heart Cath and Coronary Angiography;  Surgeon: Peter M Swaziland, MD;  Location: [facility] INVASIVE CV LAB;  Service: Cardiovascular;  Laterality: N/A;   CARDIAC ELECTROPHYSIOLOGY STUDY AND ABLATION     SA node  ablation   COLONOSCOPY     age 80   COLONOSCOPY WITH PROPOFOL N/A 05/12/2013   Dr. Jena Gauss- hemorrhoids, tubular adenoma   ESOPHAGEAL DILATION  12/20/2020   Procedure: ESOPHAGEAL DILATION;  Surgeon: Corbin Ade, MD;  Location: [facility];  Service: Endoscopy;;   ESOPHAGEAL DILATION N/A 12/20/2020   Procedure: ESOPHAGEAL DILATION;  Surgeon: Corbin Ade, MD;  Location: [facility];  Service: Endoscopy;  Laterality: N/A;   ESOPHAGOGASTRODUODENOSCOPY (EGD) WITH PROPOFOL N/A 05/12/2013   Dr. Jena Gauss- hiatal hernia, erosive reflux esophagitis   ESOPHAGOGASTRODUODENOSCOPY (EGD) WITH PROPOFOL N/A 12/20/2020   Procedure: ESOPHAGOGASTRODUODENOSCOPY (EGD) WITH PROPOFOL;  Surgeon: Corbin Ade, MD;  Location: [facility];  Service: Endoscopy;  Laterality: N/A;   ESOPHAGOGASTRODUODENOSCOPY (EGD) WITH PROPOFOL N/A 09/18/2022   Procedure: ESOPHAGOGASTRODUODENOSCOPY (EGD) WITH PROPOFOL;  Surgeon: Tressia Danas, MD;  Location: [facility] ENDOSCOPY;  Service: Gastroenterology;  Laterality: N/A;   HAND SURGERY     HEMORRHOID BANDING  06/22/13   POLYPECTOMY N/A 05/12/2013   Procedure: POLYPECTOMY;  Surgeon: Corbin Ade, MD;  Location: [facility];  Service: Endoscopy;  Laterality: N/A;   SHOULDER SURGERY      Prior to Admission medications   Medication Sig Start Date End Date Taking? Authorizing Provider  albuterol (PROVENTIL) (2.5 MG/3ML) 0.083% nebulizer solution Take 2.5 mg by nebulization every 4 (four) hours as needed. 07/17/21   [provider]  albuterol (VENTOLIN HFA) 108 (90 Base) MCG/ACT inhaler Inhale 2 puffs into the lungs every 6 (six) hours as needed for wheezing or shortness of breath.    [provider]  carvedilol (COREG) 3.125 MG tablet Take 1 tablet (3.125 mg total) by mouth 2 (two) times daily with a meal. 09/21/22   Leroy Sea, MD  chlordiazePOXIDE (LIBRIUM) 5 MG capsule Take 1 pill twice a day for 2 days then 1 pill once a day for 2 days and stop 09/21/22   Leroy Sea, MD  Cholecalciferol (VITAMIN D3) 25 MCG (1000 UT) CAPS Take 2 capsules by mouth every evening.    [provider]  famotidine (PEPCID) 20 MG tablet Take 20 mg by mouth daily.    [provider]  folic acid (FOLVITE) 1 MG tablet Take 1 tablet (1 mg total) by mouth daily. 12/21/20   Shon Hale, MD  furosemide (LASIX) 40 MG tablet Take 1 tablet (40 mg total) by mouth daily. 09/21/22   Leroy Sea, MD  hydrOXYzine (ATARAX/VISTARIL) 25 MG tablet Take 1 tablet (25 mg total) by mouth 3 (three) times daily as needed for anxiety or nausea. 12/20/20   Shon Hale, MD  Multiple Vitamin (MULTIVITAMIN WITH MINERALS) TABS tablet Take 1 tablet by mouth daily. 12/21/20   Shon Hale, MD  nicotine (NICODERM CQ - DOSED IN MG/24 HOURS) 21 mg/24hr patch Place 1 patch (21 mg total) onto the skin daily. 09/21/22   Leroy Sea, MD  nystatin cream (MYCOSTATIN) Apply 1 Application topically daily as needed for dry  skin. 09/10/21   [provider]  oxyCODONE (ROXICODONE) 15 MG immediate release tablet Take 15 mg by mouth every 6 (six) hours as needed for pain. 03/12/16   [provider]  sucralfate (CARAFATE) 1 GM/10ML suspension Take 1 g by mouth 2 (two) times daily. 06/23/22   [provider]  thiamine (VITAMIN B-1) 100 MG tablet Take 1 tablet (100 mg total) by mouth daily. 09/21/22   Leroy Sea, MD  dexlansoprazole (DEXILANT) 60 MG capsule Take 1 capsule (60 mg total) by mouth daily. 12/25/20 02/09/21  Corbin Ade, MD    Current Facility-Administered Medications  Medication Dose Route Frequency Provider Last Rate Last Admin   bisacodyl (DULCOLAX) EC tablet 5 mg  5 mg Oral Daily PRN Shahmehdi, Seyed A, MD       carvedilol (COREG) tablet 6.25 mg  6.25 mg Oral BID WC Shahmehdi, Seyed A, MD   6.25 mg at 04/28/23 0914   enoxaparin (LOVENOX) injection 55 mg  55 mg Subcutaneous Q24H Shahmehdi, Seyed A, MD   55 mg at 04/28/23 0913   famotidine  (PEPCID) tablet 20 mg  20 mg Oral BID Shahmehdi, Seyed A, MD   20 mg at 04/28/23 6962   folic acid (FOLVITE) tablet 1 mg  1 mg Oral Daily Horton, Mayer Masker, MD   1 mg at 04/28/23 0914   [START ON 04/29/2023] furosemide (LASIX) tablet 40 mg  40 mg Oral Daily Shahmehdi, Seyed A, MD       hydrALAZINE (APRESOLINE) injection 10 mg  10 mg Intravenous Q4H PRN Shahmehdi, Seyed A, MD       HYDROmorphone (DILAUDID) injection 0.5-1 mg  0.5-1 mg Intravenous Q2H PRN Shahmehdi, Seyed A, MD       hydrOXYzine (ATARAX) tablet 25 mg  25 mg Oral TID PRN Nevin Bloodgood A, MD   25 mg at 04/28/23 0814   ibuprofen (ADVIL) tablet 600 mg  600 mg Oral Q6H PRN Shahmehdi, Seyed A, MD       ipratropium (ATROVENT) nebulizer solution 0.5 mg  0.5 mg Nebulization Q6H PRN Shahmehdi, Seyed A, MD       lactated ringers infusion   Intravenous Continuous Nevin Bloodgood A, MD 50 mL/hr at 04/28/23 0919 New Bag at 04/28/23 0919   levalbuterol (XOPENEX) nebulizer solution 0.63 mg  0.63 mg Nebulization Q6H PRN Shahmehdi, Seyed A, MD       LORazepam (ATIVAN) tablet 1-4 mg  1-4 mg Oral Q1H PRN Shon Baton, MD   1 mg at 04/28/23 0815   Or   LORazepam (ATIVAN) injection 1-4 mg  1-4 mg Intravenous Q1H PRN Horton, Mayer Masker, MD       multivitamin with minerals tablet 1 tablet  1 tablet Oral Daily Horton, Mayer Masker, MD   1 tablet at 04/28/23 0914   nicotine (NICODERM CQ - dosed in mg/24 hours) patch 21 mg  21 mg Transdermal Daily Shahmehdi, Seyed A, MD   21 mg at 04/28/23 0913   ondansetron (ZOFRAN) tablet 4 mg  4 mg Oral Q6H PRN Shahmehdi, Seyed A, MD       Or   ondansetron (ZOFRAN) injection 4 mg  4 mg Intravenous Q6H PRN Shahmehdi, Seyed A, MD       oxyCODONE (Oxy IR/ROXICODONE) immediate release tablet 15 mg  15 mg Oral Q6H PRN Shahmehdi, Seyed A, MD   15 mg at 04/28/23 0816   senna-docusate (Senokot-S) tablet 1 tablet  1 tablet Oral QHS PRN Kendell Bane, MD  sodium chloride flush (NS) 0.9 % injection 3 mL  3 mL  Intravenous Q12H Shahmehdi, Seyed A, MD       sodium phosphate (FLEET) enema 1 enema  1 enema Rectal Once PRN Shahmehdi, Seyed A, MD       sucralfate (CARAFATE) 1 GM/10ML suspension 1 g  1 g Oral BID Shahmehdi, Seyed A, MD   1 g at 04/28/23 1610   thiamine (VITAMIN B1) tablet 100 mg  100 mg Oral Daily Horton, Mayer Masker, MD   100 mg at 04/28/23 9604   Or   thiamine (VITAMIN B1) injection 100 mg  100 mg Intravenous Daily Horton, Mayer Masker, MD       zolpidem (AMBIEN) tablet 5 mg  5 mg Oral QHS PRN,MR X 1 Shahmehdi, Seyed A, MD       Current Outpatient Medications  Medication Sig Dispense Refill   albuterol (PROVENTIL) (2.5 MG/3ML) 0.083% nebulizer solution Take 2.5 mg by nebulization every 4 (four) hours as needed.     albuterol (VENTOLIN HFA) 108 (90 Base) MCG/ACT inhaler Inhale 2 puffs into the lungs every 6 (six) hours as needed for wheezing or shortness of breath.     carvedilol (COREG) 3.125 MG tablet Take 1 tablet (3.125 mg total) by mouth 2 (two) times daily with a meal. 60 tablet 0   chlordiazePOXIDE (LIBRIUM) 5 MG capsule Take 1 pill twice a day for 2 days then 1 pill once a day for 2 days and stop 6 capsule 0   Cholecalciferol (VITAMIN D3) 25 MCG (1000 UT) CAPS Take 2 capsules by mouth every evening.     famotidine (PEPCID) 20 MG tablet Take 20 mg by mouth daily.     folic acid (FOLVITE) 1 MG tablet Take 1 tablet (1 mg total) by mouth daily. 30 tablet 2   furosemide (LASIX) 40 MG tablet Take 1 tablet (40 mg total) by mouth daily. 30 tablet 0   hydrOXYzine (ATARAX/VISTARIL) 25 MG tablet Take 1 tablet (25 mg total) by mouth 3 (three) times daily as needed for anxiety or nausea. 60 tablet 2   Multiple Vitamin (MULTIVITAMIN WITH MINERALS) TABS tablet Take 1 tablet by mouth daily. 120 tablet 2   nicotine (NICODERM CQ - DOSED IN MG/24 HOURS) 21 mg/24hr patch Place 1 patch (21 mg total) onto the skin daily. 28 patch 0   nystatin cream (MYCOSTATIN) Apply 1 Application topically daily as needed  for dry skin.     oxyCODONE (ROXICODONE) 15 MG immediate release tablet Take 15 mg by mouth every 6 (six) hours as needed for pain.  0   sucralfate (CARAFATE) 1 GM/10ML suspension Take 1 g by mouth 2 (two) times daily.     thiamine (VITAMIN B-1) 100 MG tablet Take 1 tablet (100 mg total) by mouth daily. 30 tablet 0    Allergies as of 04/28/2023 - Review Complete 04/28/2023  Allergen Reaction Noted   Morphine Other (See Comments)     Family History  Problem Relation Age of Onset   Coronary artery disease Father        cabg   Colon cancer Father        age 25, chemo/surgery   Breast cancer Mother    Hypertension Mother     Social History   Socioeconomic History   Marital status: Married    Spouse name: Not on file   Number of children: 2   Years of education: Not on file   Highest education level: Not on file  Occupational History   Occupation: unemployed    Associate Professor: UNEMPLOYED  Tobacco Use   Smoking status: Every Day    Current packs/day: 0.50    Average packs/day: 0.5 packs/day for 25.0 years (12.5 ttl pk-yrs)    Types: Cigarettes   Smokeless tobacco: Never  Vaping Use   Vaping status: Never Used  Substance and Sexual Activity   Alcohol use: Yes    Comment: daily; 12/18/20-5 days/week, 6 drinks at time   Drug use: Yes    Types: Marijuana    Comment: hx marijuana use   Sexual activity: Yes    Birth control/protection: None  Other Topics Concern   Not on file  Social History Narrative   Not on file   Social Determinants of Health   Financial Resource Strain: Not on file  Food Insecurity: No Food Insecurity (09/18/2022)   Hunger Vital Sign    Worried About Running Out of Food in the Last Year: Never true    Ran Out of Food in the Last Year: Never true  Transportation Needs: No Transportation Needs (09/18/2022)   PRAPARE - Administrator, Civil Service (Medical): No    Lack of Transportation (Non-Medical): No  Physical Activity: Not on file  Stress:  Not on file  Social Connections: Not on file  Intimate Partner Violence: Not At Risk (09/18/2022)   Humiliation, Afraid, Rape, and Kick questionnaire    Fear of Current or Ex-Partner: No    Emotionally Abused: No    Physically Abused: No    Sexually Abused: No    Review of Systems: Gen: Denies fever, chills, cold or flulike symptoms, presyncope, syncope. CV: Denies chest pain, heart palpitations. Resp: Denies shortness of breath, cough. GI: See HPI GU : Denies urinary burning, urinary frequency, urinary incontinence.  MS: Reports muscle weakness. Derm: Denies rash. Psych: Denies depression, anxiety. Heme: See HPI  Physical Exam: Vital signs in last 24 hours: Temp:  [98 F (36.7 C)-98.5 F (36.9 C)] 98.5 F (36.9 C) (08/13 0700) Pulse Rate:  [103-118] 103 (08/13 1000) Resp:  [10-23] 20 (08/13 1000) BP: (118-167)/(69-92) 118/69 (08/13 1000) SpO2:  [91 %-98 %] 91 % (08/13 1000) Weight:  [111.1 kg] 111.1 kg (08/13 0313)   General:   Alert,  Well-developed, well-nourished, pleasant and cooperative in NAD Head:  Normocephalic and atraumatic. Eyes:  Sclera clear, no icterus.   Conjunctiva pink. Ears:  Normal auditory acuity. Lungs:  Clear throughout to auscultation.   No wheezes, crackles, or rhonchi. No acute distress. Heart:  Regular rate and rhythm; no murmurs, clicks, rubs,  or gallops. Abdomen:  Distended and tight. + bowel sounds. Generalized tenderness to palpation. No guarding or rebound.   Rectal:  Deferred  Msk:  Symmetrical without gross deformities. Normal posture. Pulses:  Normal pulses noted. Extremities:  With 2+ pitting edema into upper, lateral thighs.  Neurologic:  Alert and  oriented x4;  grossly normal neurologically. No asterixis.  Skin:  Intact without significant lesions or rashes. Psych: Normal mood and affect.  Intake/Output from previous day: 08/12 0701 - 08/13 0700 In: 1150 [IV Piggyback:1150] Out: -  Intake/Output this shift: Total I/O In:  193.9 [IV Piggyback:193.9] Out: -   Lab Results: Recent Labs    04/28/23 0346  WBC 9.2  HGB 11.7*  HCT 33.8*  PLT 126*   BMET Recent Labs    04/28/23 0346 04/28/23 0945  NA 132* 131*  K 2.4* 2.6*  CL 87* 90*  CO2 31 29  GLUCOSE  110* 93  BUN 7 7  CREATININE 1.19 1.09  CALCIUM 8.2* 7.8*   LFT Recent Labs    04/28/23 0346 04/28/23 0945  PROT 7.3 6.8  ALBUMIN 2.6* 2.4*  AST 174* 157*  ALT 42 39  ALKPHOS 217* 220*  BILITOT 3.6* 3.9*     Studies/Results: CT ABDOMEN PELVIS W CONTRAST  Result Date: 04/28/2023 CLINICAL DATA:  Bowel obstruction suspected. Acute, nonlocalized abdominal pain. EXAM: CT ABDOMEN AND PELVIS WITH CONTRAST TECHNIQUE: Multidetector CT imaging of the abdomen and pelvis was performed using the standard protocol following bolus administration of intravenous contrast. RADIATION DOSE REDUCTION: This exam was performed according to the departmental dose-optimization program which includes automated exposure control, adjustment of the mA and/or kV according to patient size and/or use of iterative reconstruction technique. CONTRAST:  OMNIPAQUE IOHEXOL 300 MG/ML  SOLN COMPARISON:  09/24/2021 FINDINGS: Lower chest:  Coronary atherosclerosis. Hepatobiliary: Severe hepatic steatosis. Lobulated liver surface with newly enlarged caudate and further widened fissures.No evidence of biliary obstruction or stone. Pancreas: Unremarkable. Spleen: Unremarkable. Adrenals/Urinary Tract: Negative adrenals. No hydronephrosis or stone. Left renal hilar and cortical cysts with simple appearance measuring up to 3.2 cm at the renal hilum. Unremarkable bladder. Stomach/Bowel: No obstruction. No appendicitis other visible bowel inflammation. Vascular/Lymphatic: No acute vascular abnormality. Scattered atheromatous calcification. No mass or adenopathy. Reproductive:No pathologic findings. Other: New small volume ascites distributed throughout the abdomen including at the small  umbilical hernia. Musculoskeletal: No acute abnormalities. Generalized degeneration with mild scoliosis. IMPRESSION: 1. Severe hepatic steatosis with progressive findings of cirrhosis since 2023. Small volume ascites is newly seen. 2. Extensive atherosclerosis. Electronically Signed   By: Tiburcio Pea M.D.   On: 04/28/2023 05:30    Impression: 52 y.o. year old male with history of CAD, WPW s/p ablation, chronic pain, diastolic heart failure, GERD, HTN, adenomatous colon polyp, gastric ulcers, ulcerative esophagitis, alcohol abuse, who presented to the emergency room today with chief complaint of abdominal pain/distention, vomiting, difficulty with bowel movement.  He was found to have mild anemia with hemoglobin of 11.7, hyponatremia, hypokalemia, elevated LFTs and bilirubin, EtOH 245.  CT A/P with contrast with severe hepatic steatosis with progressive findings of cirrhosis, small volume ascites.  GI now consulted for abdominal pain and cirrhosis.  Generalized abdominal pain/abdominal distention: Likely multifactorial in setting of ascites and constipation.  Planning for paracentesis today for therapeutic and diagnostic purposes. Will also start MiraLAX daily to help with constipation.  Holding off on lactulose for now as he has no hepatic encephalopathy and on exam, it sounds like he has a lot of gas within his colon.   Cirrhosis: Likely secondary to chronic alcohol abuse.  Currently drinking one 22 ounce beer a day, down from 3-4 daily. Decompensated with ascites, lower extreme edema, thrombocytopenia.  Unable to calculate MELD today as there is no INR on file.  Will check INR in the morning for meld calculation.   He is Lasix 40 mg daily outpatient, but reports months of persistent lower extremity edema into his thighs and now with abdominal distention over the last week.  Unfortunately, he has severe hypokalemia so diuretics are on hold, but will need to be resumed once electrolytes are corrected.  We will go ahead and plan for paracentesis today for diagnostic and therapeutic purposes.  No encephalopathy.  He does report an episode of melena as well as rectal bleeding 3 to 4 days ago with no recurrent symptoms.  Last EGD January 2024 with ulcerative esophagitis, no esophageal or  gastric varices. Notably, patient was on carvedilol 3.125 mg twice daily outpatient.  This has been increased to 6.25 mg twice daily by hospitalist which I think is appropriate in this setting.   No focal liver lesion noted on CT this admission.  Will add AFP tomorrow.   Nausea/vomiting:  Chronic, ongoing at least since January this year. EGD at that time with erosive grade D reflux esophagitis with ulceration.  Suspect ongoing nausea/vomiting may very well be related to uncontrolled reflux as he is not on a PPI. Chronic alcohol use also likely influencing symptom. Will start on PPI BID and Zofran PRN for now. Can order scheduled zofran if needed.   Anemia/melena/hematochezia: Hemoglobin 11.7 with normocytic indices on admission.  This is down from Hgb 13.5 09/21/22 though I do note his Hgb was 11.8 on 09/20/22.  He reports an episode of melena as well as 2 episodes of prior blood per rectum 3-4 days ago with no recurrent symptoms.  Last EGD January 2024 with grade D reflux esophagitis with ulceration.  Unfortunately, he has not been taking a PPI outpatient.  Last colonoscopy in 2014 with tubular adenomas, currently overdue for surveillance.  For now, recommend resuming PPI twice daily, monitoring for recurrent overt GI bleeding, and planning for outpatient EGD and colonoscopy.  If recurrent GI bleeding, consider inpatient procedures. Will check iron, B12, and folate.   Hypokalemia: Potassium 2.4 on admission.  Likely multifactorial in the setting of nausea/vomiting, diuretics, chronic alcohol abuse.  Management per hospitalist.  Hyponatremia: Sodium 132 on admission (stable).  Likely multifactorial in the setting of  nausea/vomiting, diuretics, chronic alcohol abuse.  Management per hospitalist.     Plan: US paracentesis with labs today.  INR, AFP, CBC, CMP, Iron, B12, folate tomorrow morning. Will calculate MELD at that time. Will need to start Lasix 40 mg and spironolactone 100 mg daily once electrolytes have been corrected. Continue carvedilol 6.25 mg twice daily. Start MiraLAX 17 g daily. Monitor for hepatic encephalopathy.  Start IV PPI twice daily. Zofran 4 mg q8 hours prn.  Management of hypokalemia and hyponatremia per hospitalist. Counseled on alcohol cessation. 2g sodium diet.  He will need ongoing cirrhosis care outpatient. Recommend EGD and colonoscopy outpatient unless recurrent GI bleeding.   LOS: 0 days    04/28/2023, 10:23 AM   Ermalinda Memos, PA-C [facility]

## 2023-04-28 NOTE — Assessment & Plan Note (Signed)
-   Replacing with 2 g of IV magnesium

## 2023-04-28 NOTE — ED Provider Notes (Signed)
Section EMERGENCY DEPARTMENT AT [facility] Provider Note   CSN: [id number] Arrival date & time: 04/28/23  0102     History  Chief Complaint  Patient presents with   Abdominal Pain    Timothy [name] is a 52 y.o. male.  HPI     This is a 52 year old male with a history of chronic pain and alcohol abuse who presents with abdominal pain.  Patient reports 2 to 3-day history of worsening abdominal pain, distention.  He states that his bellybutton is popped out which is unusual for him.  He reports nonbilious, nonbloody emesis and difficulty having bowel movements.  He took his home oxycodone with minimal relief.  Reports that he drinks 1 beer daily.  Home Medications Prior to Admission medications   Medication Sig Start Date End Date Taking? Authorizing Provider  albuterol (PROVENTIL) (2.5 MG/3ML) 0.083% nebulizer solution Take 2.5 mg by nebulization every 4 (four) hours as needed. 07/17/21   [provider]  albuterol (VENTOLIN HFA) 108 (90 Base) MCG/ACT inhaler Inhale 2 puffs into the lungs every 6 (six) hours as needed for wheezing or shortness of breath.    [provider]  carvedilol (COREG) 3.125 MG tablet Take 1 tablet (3.125 mg total) by mouth 2 (two) times daily with a meal. 09/21/22   Leroy Sea, MD  chlordiazePOXIDE (LIBRIUM) 5 MG capsule Take 1 pill twice a day for 2 days then 1 pill once a day for 2 days and stop 09/21/22   Leroy Sea, MD  Cholecalciferol (VITAMIN D3) 25 MCG (1000 UT) CAPS Take 2 capsules by mouth every evening.    [provider]  famotidine (PEPCID) 20 MG tablet Take 20 mg by mouth daily.    [provider]  folic acid (FOLVITE) 1 MG tablet Take 1 tablet (1 mg total) by mouth daily. 12/21/20   Shon Hale, MD  furosemide (LASIX) 40 MG tablet Take 1 tablet (40 mg total) by mouth daily. 09/21/22   Leroy Sea, MD  hydrOXYzine (ATARAX/VISTARIL) 25 MG tablet Take 1 tablet (25 mg total) by mouth  3 (three) times daily as needed for anxiety or nausea. 12/20/20   Shon Hale, MD  Multiple Vitamin (MULTIVITAMIN WITH MINERALS) TABS tablet Take 1 tablet by mouth daily. 12/21/20   Shon Hale, MD  nicotine (NICODERM CQ - DOSED IN MG/24 HOURS) 21 mg/24hr patch Place 1 patch (21 mg total) onto the skin daily. 09/21/22   Leroy Sea, MD  nystatin cream (MYCOSTATIN) Apply 1 Application topically daily as needed for dry skin. 09/10/21   [provider]  oxyCODONE (ROXICODONE) 15 MG immediate release tablet Take 15 mg by mouth every 6 (six) hours as needed for pain. 03/12/16   [provider]  sucralfate (CARAFATE) 1 GM/10ML suspension Take 1 g by mouth 2 (two) times daily. 06/23/22   [provider]  thiamine (VITAMIN B-1) 100 MG tablet Take 1 tablet (100 mg total) by mouth daily. 09/21/22   Leroy Sea, MD  dexlansoprazole (DEXILANT) 60 MG capsule Take 1 capsule (60 mg total) by mouth daily. 12/25/20 02/09/21  Corbin Ade, MD      Allergies    Morphine    Review of Systems   Review of Systems  Constitutional:  Negative for fever.  Respiratory:  Negative for shortness of breath.   Cardiovascular:  Negative for chest pain.  Gastrointestinal:  Positive for abdominal distention, abdominal pain, constipation, nausea and vomiting.  All other  systems reviewed and are negative.   Physical Exam Updated Vital Signs BP (!) 149/90   Pulse (!) 103   Temp 98 F (36.7 C) (Oral)   Resp 10   Ht 1.829 m (6')   Wt 111.1 kg   SpO2 97%   BMI 33.23 kg/m  Physical Exam Vitals and nursing note reviewed.  Constitutional:      Appearance: He is well-developed. He is obese. He is not ill-appearing.  HENT:     Head: Normocephalic and atraumatic.  Eyes:     Pupils: Pupils are equal, round, and reactive to light.  Cardiovascular:     Rate and Rhythm: Normal rate and regular rhythm.     Heart sounds: Normal heart sounds. No murmur heard. Pulmonary:     Effort:  Pulmonary effort is normal. No respiratory distress.     Breath sounds: Normal breath sounds. No wheezing.  Abdominal:     General: Abdomen is protuberant. Bowel sounds are normal.     Palpations: Abdomen is soft.     Tenderness: There is no abdominal tenderness. There is no guarding or rebound.     Hernia: A hernia is present. Hernia is present in the umbilical area.     Comments: Reducible umbilical hernia  Musculoskeletal:     Cervical back: Neck supple.  Lymphadenopathy:     Cervical: No cervical adenopathy.  Skin:    General: Skin is warm and dry.  Neurological:     Mental Status: He is alert and oriented to person, place, and time.  Psychiatric:        Mood and Affect: Mood normal.     ED Results / Procedures / Treatments   Labs (all labs ordered are listed, but only abnormal results are displayed) Labs Reviewed  CBC WITH DIFFERENTIAL/PLATELET - Abnormal; Notable for the following components:      Result Value   RBC 3.67 (*)    Hemoglobin 11.7 (*)    HCT 33.8 (*)    RDW 17.9 (*)    Platelets 126 (*)    All other components within normal limits  COMPREHENSIVE METABOLIC PANEL - Abnormal; Notable for the following components:   Sodium 132 (*)    Potassium 2.4 (*)    Chloride 87 (*)    Glucose, Bld 110 (*)    Calcium 8.2 (*)    Albumin 2.6 (*)    AST 174 (*)    Alkaline Phosphatase 217 (*)    Total Bilirubin 3.6 (*)    All other components within normal limits  LIPASE, BLOOD - Abnormal; Notable for the following components:   Lipase 53 (*)    All other components within normal limits  ETHANOL - Abnormal; Notable for the following components:   Alcohol, Ethyl (B) 245 (*)    All other components within normal limits    EKG EKG Interpretation Date/Time:  Tuesday April 28 2023 03:15:42 EDT Ventricular Rate:  109 PR Interval:  126 QRS Duration:  153 QT Interval:  398 QTC Calculation: 536 R Axis:   28  Text Interpretation: Sinus tachycardia Atrial premature  complex Right bundle branch block Confirmed by Ross Marcus ([id number]) on 04/28/2023 4:45:48 AM  Radiology CT ABDOMEN PELVIS W CONTRAST  Result Date: 04/28/2023 CLINICAL DATA:  Bowel obstruction suspected. Acute, nonlocalized abdominal pain. EXAM: CT ABDOMEN AND PELVIS WITH CONTRAST TECHNIQUE: Multidetector CT imaging of the abdomen and pelvis was performed using the standard protocol following bolus administration of intravenous contrast. RADIATION DOSE REDUCTION: This exam  was performed according to the departmental dose-optimization program which includes automated exposure control, adjustment of the mA and/or kV according to patient size and/or use of iterative reconstruction technique. CONTRAST:  OMNIPAQUE IOHEXOL 300 MG/ML  SOLN COMPARISON:  09/24/2021 FINDINGS: Lower chest:  Coronary atherosclerosis. Hepatobiliary: Severe hepatic steatosis. Lobulated liver surface with newly enlarged caudate and further widened fissures.No evidence of biliary obstruction or stone. Pancreas: Unremarkable. Spleen: Unremarkable. Adrenals/Urinary Tract: Negative adrenals. No hydronephrosis or stone. Left renal hilar and cortical cysts with simple appearance measuring up to 3.2 cm at the renal hilum. Unremarkable bladder. Stomach/Bowel: No obstruction. No appendicitis other visible bowel inflammation. Vascular/Lymphatic: No acute vascular abnormality. Scattered atheromatous calcification. No mass or adenopathy. Reproductive:No pathologic findings. Other: New small volume ascites distributed throughout the abdomen including at the small umbilical hernia. Musculoskeletal: No acute abnormalities. Generalized degeneration with mild scoliosis. IMPRESSION: 1. Severe hepatic steatosis with progressive findings of cirrhosis since 2023. Small volume ascites is newly seen. 2. Extensive atherosclerosis. Electronically Signed   By: Tiburcio Pea M.D.   On: 04/28/2023 05:30    Procedures .Critical Care  Performed by: Shon Baton, MD Authorized by: Shon Baton, MD   Critical care provider statement:    Critical care time (minutes):  45   Critical care was necessary to treat or prevent imminent or life-threatening deterioration of the following conditions:  Metabolic crisis and hepatic failure   Critical care was time spent personally by me on the following activities:  Development of treatment plan with patient or surrogate, discussions with consultants, evaluation of patient's response to treatment, examination of patient, ordering and review of laboratory studies, ordering and review of radiographic studies, ordering and performing treatments and interventions, pulse oximetry, re-evaluation of patient's condition and review of old charts     Medications Ordered in ED Medications  potassium chloride 10 mEq in 100 mL IVPB (10 mEq Intravenous New Bag/Given 04/28/23 0522)  LORazepam (ATIVAN) tablet 1-4 mg (has no administration in time range)    Or  LORazepam (ATIVAN) injection 1-4 mg (has no administration in time range)  thiamine (VITAMIN B1) tablet 100 mg (has no administration in time range)    Or  thiamine (VITAMIN B1) injection 100 mg (has no administration in time range)  folic acid (FOLVITE) tablet 1 mg (has no administration in time range)  multivitamin with minerals tablet 1 tablet (has no administration in time range)  ondansetron (ZOFRAN) injection 4 mg (4 mg Intravenous Given 04/28/23 0414)  fentaNYL (SUBLIMAZE) injection 100 mcg (100 mcg Intravenous Given 04/28/23 0414)  potassium chloride SA (KLOR-CON M) CR tablet 40 mEq (40 mEq Oral Given 04/28/23 0521)  magnesium sulfate IVPB 2 g 50 mL (2 g Intravenous New Bag/Given 04/28/23 0523)  iohexol (OMNIPAQUE) 300 MG/ML solution 100 mL (100 mLs Intravenous Contrast Given 04/28/23 0458)  sodium chloride 0.9 % bolus 1,000 mL (1,000 mLs Intravenous New Bag/Given 04/28/23 1191)    ED Course/ Medical Decision Making/ A&P                                  Medical Decision Making Amount and/or Complexity of Data Reviewed Labs: ordered. Radiology: ordered.  Risk OTC drugs. Prescription drug management. Decision regarding hospitalization.   This patient presents to the ED for concern of abdominal pain, this involves an extensive number of treatment options, and is a complaint that carries with it a high risk of complications and  morbidity.  I considered the following differential and admission for this acute, potentially life threatening condition.  The differential diagnosis includes hernia, SBO, cirrhosis, gastritis  MDM:    This is a 52 year old male who presents with abdominal pain, nausea, vomiting.  He is overall nontoxic.  He had a distended abdomen.  He is a daily drinker.  He has a small reducible umbilical hernia.  No significant abdominal tenderness.  He is afebrile.  He is tachycardic in the 110s.  Patient was given fluids, nausea medication, pain medication.  Labs notable for potassium of 2.4.  This was replaced oral and IV.  He has an AST of 174 and a bilirubin that is increasing to 3.6.  Suspect that this is reflective of alcoholic liver disease.  CT scan does not show any evidence of SBO.  There are changes that are consistent with cirrhosis and ascites.  Patient reports persistent pain and nausea on reevaluation.  Discussed with him that I felt that his abdominal pain may be related to symptoms of progressive cirrhosis.  Given the severity of his hyponatremia, will admit for potassium and magnesium replacement.  He was placed on CIWA protocol given that he drinks daily.  (Labs, imaging, consults)  Labs: I Ordered, and personally interpreted labs.  The pertinent results include: CBC, CMP, lipase  Imaging Studies ordered: I ordered imaging studies including CT abdomen pelvis I independently visualized and interpreted imaging. I agree with the radiologist interpretation  Additional history obtained from chart review.   External records from outside source obtained and reviewed including prior evaluations  Cardiac Monitoring: The patient was maintained on a cardiac monitor.  If on the cardiac monitor, I personally viewed and interpreted the cardiac monitored which showed an underlying rhythm of: Sinus rhythm  Reevaluation: After the interventions noted above, I reevaluated the patient and found that they have :stayed the same  Social Determinants of Health:  alcohol use disorder  Disposition: Admit  Co morbidities that complicate the patient evaluation  Past Medical History:  Diagnosis Date   Anxiety    Arthritis    CAD (coronary artery disease) 2007   50% stenosis small diagonal   Central sleep apnea    Chronic pain    since 1998 has been on chronic pain medication   Fatty liver    GERD (gastroesophageal reflux disease)    Hemorrhoids    Hepatomegaly 10/03/2014   HTN (hypertension)    Palpitations    Syncope    Tachycardia    Tobacco abuse    Tubular adenoma    Wolff-Parkinson-White (WPW) syndrome    says was cured with ablation     Medicines Meds ordered this encounter  Medications   ondansetron (ZOFRAN) injection 4 mg   fentaNYL (SUBLIMAZE) injection 100 mcg   potassium chloride SA (KLOR-CON M) CR tablet 40 mEq   potassium chloride 10 mEq in 100 mL IVPB   magnesium sulfate IVPB 2 g 50 mL   iohexol (OMNIPAQUE) 300 MG/ML solution 100 mL   sodium chloride 0.9 % bolus 1,000 mL   OR Linked Order Group    LORazepam (ATIVAN) tablet 1-4 mg     Order Specific Question:   CIWA-AR < 5 =     Answer:   0 mg     Order Specific Question:   CIWA-AR 5 -10 =     Answer:   1 mg     Order Specific Question:   CIWA-AR 11 -15 =     Answer:  2 mg     Order Specific Question:   CIWA-AR 16 -20 =     Answer:   3 mg     Order Specific Question:   CIWA-AR 16 -20 =     Answer:   Recheck CIWA-AR in 1 hour; if > 20 notify MD     Order Specific Question:   CIWA-AR > 20 =     Answer:   4 mg     Order  Specific Question:   CIWA-AR > 20 =     Answer:   Call Rapid Response    LORazepam (ATIVAN) injection 1-4 mg     Order Specific Question:   CIWA-AR < 5 =     Answer:   0 mg     Order Specific Question:   CIWA-AR 5 -10 =     Answer:   1 mg     Order Specific Question:   CIWA-AR 11 -15 =     Answer:   2 mg     Order Specific Question:   CIWA-AR 16 -20 =     Answer:   3 mg     Order Specific Question:   CIWA-AR 16 -20 =     Answer:   Recheck CIWA-AR in 1 hour; if > 20 notify MD     Order Specific Question:   CIWA-AR > 20 =     Answer:   4 mg     Order Specific Question:   CIWA-AR > 20 =     Answer:   Call Rapid Response   OR Linked Order Group    thiamine (VITAMIN B1) tablet 100 mg    thiamine (VITAMIN B1) injection 100 mg   folic acid (FOLVITE) tablet 1 mg   multivitamin with minerals tablet 1 tablet    I have reviewed the patients home medicines and have made adjustments as needed  Problem List / ED Course: Problem List Items Addressed This Visit       Other   Hypokalemia - Primary   Other Visit Diagnoses     Alcoholic cirrhosis of liver with ascites (HCC)                       Final Clinical Impression(s) / ED Diagnoses Final diagnoses:  Hypokalemia  Alcoholic cirrhosis of liver with ascites [facility])    Rx / DC Orders ED Discharge Orders     None         Shon Baton, MD 04/28/23 [phone]

## 2023-04-28 NOTE — Evaluation (Signed)
Physical Therapy Evaluation Patient Details Name: Timothy [name] MRN: [id number] DOB: 1971-04-05 Today's Date: 04/28/2023  History of Present Illness  Timothy [name] is a 52 year old male with history of HTN,  Barrett esophagus, PUD, WPW status post ablation, NICM, alcohol abuse, and liver disease who presents with worsening abdominal distention and pain.  For past 2-3 days.  Reporting his bellybutton is popping out now.  Voiding and nonbilious nonbloody emesis difficulty having bowel movements. (per MD)   Clinical Impression  Patient demonstrates slow labored movement for sitting up in bed due to weakness, once seated unable to attempt OOB activities secondary to c/o severe nausea with occasional vomiting.  Patient will benefit from continued skilled physical therapy in hospital and recommended venue below to increase strength, balance, endurance for safe ADLs and gait.         If plan is discharge home, recommend the following: A lot of help with walking and/or transfers;A lot of help with bathing/dressing/bathroom;Help with stairs or ramp for entrance   Can travel by private vehicle   Yes    Equipment Recommendations None recommended by PT  Recommendations for Other Services       Functional Status Assessment Patient has had a recent decline in their functional status and demonstrates the ability to make significant improvements in function in a reasonable and predictable amount of time.     Precautions / Restrictions Precautions Precautions: Fall Restrictions Weight Bearing Restrictions: No      Mobility  Bed Mobility Overal bed mobility: Needs Assistance Bed Mobility: Supine to Sit, Sit to Supine     Supine to sit: Min assist Sit to supine: Contact guard assist   General bed mobility comments: increased time, labored movement, limited mostly due to c/o nausea    Transfers                        Ambulation/Gait                  Stairs             Wheelchair Mobility     Tilt Bed    Modified Rankin (Stroke Patients Only)       Balance Overall balance assessment: Needs assistance Sitting-balance support: Feet unsupported, No upper extremity supported Sitting balance-Leahy Scale: Fair Sitting balance - Comments: long sitting in bed                                     Pertinent Vitals/Pain Pain Assessment Pain Assessment: Faces Faces Pain Scale: Hurts even more Pain Location: abdominal Pain Descriptors / Indicators: Grimacing, Other (Comment) (nausea) Pain Intervention(s): Limited activity within patient's tolerance, Monitored during session, Repositioned    Home Living Family/patient expects to be discharged to:: Private residence Living Arrangements: Spouse/significant other;Children Available Help at Discharge: Family;Available 24 hours/day Type of Home: House Home Access: Stairs to enter Entrance Stairs-Rails: Doctor, general practice of Steps: 7   Home Layout: One level Home Equipment: Agricultural consultant (2 wheels) Additional Comments: Vague when asked if wife could physically help.    Prior Function Prior Level of Function : Needs assist       Physical Assist : ADLs (physical)     Mobility Comments: houshold ambulator with RW; reports increased difficulty with mobility in the past few weeks.       Extremity/Trunk Assessment   Upper Extremity Assessment Upper Extremity  Assessment: Defer to OT evaluation    Lower Extremity Assessment Lower Extremity Assessment: Generalized weakness    Cervical / Trunk Assessment Cervical / Trunk Assessment: Normal  Communication   Communication Communication: No apparent difficulties  Cognition Arousal: Alert Behavior During Therapy: WFL for tasks assessed/performed, Anxious Overall Cognitive Status: Within Functional Limits for tasks assessed                                          General Comments       Exercises     Assessment/Plan    PT Assessment Patient needs continued PT services  PT Problem List Decreased strength;Decreased activity tolerance;Decreased balance;Decreased mobility       PT Treatment Interventions DME instruction;Gait training;Stair training;Functional mobility training;Therapeutic activities;Therapeutic exercise;Balance training;Patient/family education    PT Goals (Current goals can be found in the Care Plan section)  Acute Rehab PT Goals Patient Stated Goal: return home PT Goal Formulation: With patient Time For Goal Achievement: 05/12/23 Potential to Achieve Goals: Good    Frequency Min 3X/week     Co-evaluation PT/OT/SLP Co-Evaluation/Treatment: Yes Reason for Co-Treatment: To address functional/ADL transfers PT goals addressed during session: Mobility/safety with mobility;Balance         AM-PAC PT "6 Clicks" Mobility  Outcome Measure Help needed turning from your back to your side while in a flat bed without using bedrails?: A Little Help needed moving from lying on your back to sitting on the side of a flat bed without using bedrails?: A Little Help needed moving to and from a bed to a chair (including a wheelchair)?: A Lot Help needed standing up from a chair using your arms (e.g., wheelchair or bedside chair)?: A Lot Help needed to walk in hospital room?: A Lot Help needed climbing 3-5 steps with a railing? : A Lot 6 Click Score: 14    End of Session   Activity Tolerance: Patient tolerated treatment well;Patient limited by fatigue Patient left: in bed;with call bell/phone within reach Nurse Communication: Mobility status PT Visit Diagnosis: Unsteadiness on feet (R26.81);Other abnormalities of gait and mobility (R26.89);Muscle weakness (generalized) (M62.81)    Time: 1610-9604 PT Time Calculation (min) (ACUTE ONLY): 20 min   Charges:   PT Evaluation $PT Eval Moderate Complexity: 1 Mod PT Treatments $Therapeutic Activity: 8-22  mins PT General Charges $$ ACUTE PT VISIT: 1 Visit         1:59 PM, 04/28/23 Ocie Bob, MPT Physical Therapist with [facility] [phone] office [phone] mobile phone

## 2023-04-28 NOTE — Assessment & Plan Note (Signed)
Continue PPI ?

## 2023-04-28 NOTE — Assessment & Plan Note (Signed)
-   Monitoring continue PPI

## 2023-04-29 ENCOUNTER — Inpatient Hospital Stay (HOSPITAL_COMMUNITY): Payer: 59

## 2023-04-29 DIAGNOSIS — E876 Hypokalemia: Secondary | ICD-10-CM

## 2023-04-29 DIAGNOSIS — F1010 Alcohol abuse, uncomplicated: Secondary | ICD-10-CM

## 2023-04-29 DIAGNOSIS — R7989 Other specified abnormal findings of blood chemistry: Secondary | ICD-10-CM

## 2023-04-29 DIAGNOSIS — K219 Gastro-esophageal reflux disease without esophagitis: Secondary | ICD-10-CM

## 2023-04-29 DIAGNOSIS — K7030 Alcoholic cirrhosis of liver without ascites: Secondary | ICD-10-CM | POA: Diagnosis not present

## 2023-04-29 DIAGNOSIS — R1084 Generalized abdominal pain: Secondary | ICD-10-CM | POA: Diagnosis not present

## 2023-04-29 LAB — MAGNESIUM: Magnesium: 1.8 mg/dL (ref 1.7–2.4)

## 2023-04-29 LAB — MRSA NEXT GEN BY PCR, NASAL: MRSA by PCR Next Gen: NOT DETECTED

## 2023-04-29 LAB — GLUCOSE, CAPILLARY: Glucose-Capillary: 89 mg/dL (ref 70–99)

## 2023-04-29 MED ORDER — IBUPROFEN 400 MG PO TABS: 600.0000 mg | ORAL_TABLET | Freq: Three times a day (TID) | ORAL | Status: DC | PRN

## 2023-04-29 MED ORDER — POTASSIUM CHLORIDE CRYS ER 20 MEQ PO TBCR
40.0000 meq | EXTENDED_RELEASE_TABLET | Freq: Once | ORAL | Status: AC
  Administered 2023-04-29: 40 meq via ORAL
  Filled 2023-04-29: qty 2

## 2023-04-29 MED ORDER — POTASSIUM CHLORIDE CRYS ER 20 MEQ PO TBCR
60.0000 meq | EXTENDED_RELEASE_TABLET | Freq: Once | ORAL | Status: AC
  Administered 2023-04-29: 60 meq via ORAL
  Filled 2023-04-29: qty 3

## 2023-04-29 MED ORDER — MAGNESIUM SULFATE 4 GM/100ML IV SOLN
4.0000 g | Freq: Once | INTRAVENOUS | Status: AC
  Administered 2023-04-29: 4 g via INTRAVENOUS
  Filled 2023-04-29: qty 100

## 2023-04-29 MED ORDER — CHLORHEXIDINE GLUCONATE CLOTH 2 % EX PADS
6.0000 | MEDICATED_PAD | Freq: Every day | CUTANEOUS | Status: DC
  Administered 2023-04-29 – 2023-05-04 (×7): 6 via TOPICAL

## 2023-04-29 MED ORDER — POTASSIUM CHLORIDE 10 MEQ/100ML IV SOLN
10.0000 meq | INTRAVENOUS | Status: AC
  Administered 2023-04-29 (×3): 10 meq via INTRAVENOUS
  Filled 2023-04-29 (×3): qty 100

## 2023-04-29 MED ORDER — BISACODYL 10 MG RE SUPP
10.0000 mg | Freq: Once | RECTAL | Status: AC
  Administered 2023-04-29: 10 mg via RECTAL
  Filled 2023-04-29: qty 1

## 2023-04-29 MED ORDER — MAGNESIUM SULFATE IN D5W 1-5 GM/100ML-% IV SOLN: 1.0000 g | Freq: Once | INTRAVENOUS | Status: DC

## 2023-04-29 MED ORDER — POTASSIUM CHLORIDE CRYS ER 20 MEQ PO TBCR: 40.0000 meq | EXTENDED_RELEASE_TABLET | Freq: Once | ORAL | Status: DC

## 2023-04-29 NOTE — Plan of Care (Signed)

## 2023-04-29 NOTE — TOC Progression Note (Addendum)
Transition of Care [facility]) - Progression Note    Patient Details  Name: Timothy [name] MRN: [id number] Date of Birth: 28-Dec-1970  Transition of Care [facility]) CM/SW Contact  Annice Needy, LCSW Phone Number: 04/29/2023, 2:36 PM  Clinical Narrative:    Patient agreeable to SNF for rehab. States that he ambulates "at times" when he is home with an adjustable walker.  PCP listing added to AVS.  Expected Discharge Plan: Skilled Nursing Facility Barriers to Discharge: Continued Medical Work up  Expected Discharge Plan and Services In-house Referral: Clinical Social Work     Living arrangements for the past 2 months: Single Family Home                                       Social Determinants of Health (SDOH) Interventions SDOH Screenings   Food Insecurity: No Food Insecurity (09/18/2022)  Housing: Low Risk  (09/18/2022)  Transportation Needs: No Transportation Needs (09/18/2022)  Utilities: Not At Risk (09/18/2022)  Tobacco Use: High Risk (10/15/2022)    Readmission Risk Interventions     No data to display

## 2023-04-29 NOTE — Progress Notes (Signed)
Occupational Therapy Treatment Patient Details Name: Timothy [name] MRN: [id number] DOB: [birth year] Today's Date: 04/29/2023   History of present illness VARDAAN [name] is a 52 year old male with history of HTN,  Barrett esophagus, PUD, WPW status post ablation, NICM, alcohol abuse, and liver disease who presents with worsening abdominal distention and pain.  For past 2-3 days.  Reporting his bellybutton is popping out now.  Voiding and nonbilious nonbloody emesis difficulty having bowel movements.   OT comments  Pt agreeable to OT treatment. Pt demonstrated good ability to engaged in the session today. Pt was not as limited as during the evaluation. Today pt required min A for bed mobility with HOB elevated. Contact guard to min A for step pivot to the chair. Min to mod A for lower body dressing of socks with much time and labored effort. Pt continues to demonstrate poor endurance, but was more engaged today. Pt will benefit from continued OT in the hospital and recommended venue below to increase strength, balance, and endurance for safe ADL's.         If plan is discharge home, recommend the following:  A lot of help with walking and/or transfers;A lot of help with bathing/dressing/bathroom;Assistance with cooking/housework;Assist for transportation;Help with stairs or ramp for entrance   Equipment Recommendations  None recommended by OT          Precautions / Restrictions Precautions Precautions: Fall Restrictions Weight Bearing Restrictions: No       Mobility Bed Mobility Overal bed mobility: Needs Assistance Bed Mobility: Supine to Sit     Supine to sit: Min assist     General bed mobility comments: increased time and labored movement; hand held assist    Transfers Overall transfer level: Needs assistance Equipment used: Rolling walker (2 wheels) Transfers: Sit to/from Stand, Bed to chair/wheelchair/BSC Sit to Stand: Contact guard assist, Min assist     Step  pivot transfers: Contact guard assist, Min assist     General transfer comment: Un[place]ay in standing; extended time and labored effort.     Balance Overall balance assessment: Needs assistance Sitting-balance support: Feet supported, Bilateral upper extremity supported Sitting balance-Leahy Scale: Good Sitting balance - Comments: seated at EOB   Standing balance support: Bilateral upper extremity supported, During functional activity, Reliant on assistive device for balance Standing balance-Leahy Scale: Fair Standing balance comment: using RW                           ADL either performed or assessed with clinical judgement   ADL Overall ADL's : Needs assistance/impaired                     Lower Body Dressing: Minimal assistance;Moderate assistance;Sitting/lateral leans Lower Body Dressing Details (indicate cue type and reason): Assist to don L sock seated in recliner. Able to doff. Pt did not fully doff R sock. Labored effort and extended time.                                                    Cognition Arousal: Alert Behavior During Therapy: WFL for tasks assessed/performed, Anxious Overall Cognitive Status: Within Functional Limits for tasks assessed  Pertinent Vitals/ Pain       Pain Assessment Pain Assessment: 0-10 Pain Score: 8  Pain Location: abdominal area Pain Descriptors / Indicators: Grimacing Pain Intervention(s): Limited activity within patient's tolerance, Monitored during session, Repositioned                                                          Frequency  Min 2X/week        Progress Toward Goals  OT Goals(current goals can now be found in the care plan section)  Progress towards OT goals: Progressing toward goals  Acute Rehab OT Goals Patient Stated Goal: go to rehab OT Goal Formulation:  With patient Time For Goal Achievement: 05/12/23 Potential to Achieve Goals: Fair ADL Goals Pt Will Perform Grooming: with contact guard assist;standing Pt Will Perform Upper Body Bathing: with modified independence;sitting Pt Will Perform Lower Body Bathing: with min assist;sitting/lateral leans Pt Will Perform Upper Body Dressing: with modified independence;sitting Pt Will Perform Lower Body Dressing: with min assist;sitting/lateral leans Pt Will Transfer to Toilet: with contact guard assist;ambulating Pt Will Perform Toileting - Clothing Manipulation and hygiene: with modified independence;sitting/lateral leans Pt/caregiver will Perform Home Exercise Program: Increased ROM;Increased strength;Right Upper extremity;Left upper extremity;Independently  Plan                                      End of Session Equipment Utilized During Treatment: Rolling walker (2 wheels)  OT Visit Diagnosis: Unsteadiness on feet (R26.81);Other abnormalities of gait and mobility (R26.89);Muscle weakness (generalized) (M62.81)   Activity Tolerance Patient tolerated treatment well   Patient Left in chair;with call bell/phone within reach;with nursing/sitter in room   Nurse Communication Mobility status        Time: 1610-9604 OT Time Calculation (min): 16 min  Charges: OT General Charges $OT Visit: 1 Visit OT Treatments $Self Care/Home Management : 8-22 mins  [facility] OT, MOT  Danie Chandler 04/29/2023, 9:46 AM

## 2023-04-29 NOTE — Discharge Instructions (Signed)
  Providers Accepting New Patients in [place], [place]    [facility] [address]  [place], [place] [address] [phone] Accepts most insurances  [facility] [address] [place], [place] [address] [phone] Accepts most insurances  [facility] [place] [address] [place], [place] [address]  [phone] Must meet requirements  [facility] [address]  [place], [place] [address] [phone] Accepts most insurances  [facility] [address]  [place], [place] [address] [phone] Accepts most insurances  [facility] [address]   [place], [place]   [phone] Accepts most insurances  [facility] ([facility]  [address]  [place], [place] [address] [phone] Accepts most insurances     [facility] [address]  [place], [place] [address] [phone] Accepts most insurances  [facility] [facility] [address] [place], [place] [address] [phone] option 1 Accepts Medicaid and Uninsured  [facility] [address]  [place], [place] [address] [phone] Accepts most insurances  Tesfaye Fanta, MD [address]  [place], [place] [address] [phone] Accepts most insurances  [facility] [place] [address]  [place], [place] [address] [phone] Accepts most insurances  [facility] [address] [place], [place] [address] [phone] Accepts most insurances  Zack Hall, MD [address] [place],  [address] [phone]  Accepts most insurances                      

## 2023-04-29 NOTE — Progress Notes (Signed)
PROGRESS NOTE   Timothy [name]  GEX:[id number] DOB: Mar 15, 1971 DOA: 04/28/2023 PCP: Elfredia Nevins, MD   Chief Complaint  Patient presents with   Abdominal Pain   Level of care: Stepdown  Brief Admission History:  Timothy [name] is a 52 year old male with history of HTN, Barrett esophagus, PUD, WPW status post ablation, NICM, alcohol abuse, and liver disease who presents with worsening abdominal distention and pain.  For past 2-3 days prior to arrival he is reporting his bellybutton is popping out now.  Voiding and nonbilious nonbloody emesis difficulty having bowel movements.   ED: Blood pressure (!) 153/88, pulse (!) 111, temperature 98.5 F (36.9 C), resp. rate 11, height 6' (1.829 m), weight 111.1 kg, SpO2 95%.  He is found to have potassium of 2.4, AST 179, total bilirubin 3.6, platelets 126, and prolonged QT interval.  CT demonstrates progressive cirrhotic changes and new small volume ascites.  He was treated with fentanyl, magnesium and potassium, Zofran, and a liter of saline in the ED.  ED requesting admission for further electrolyte correction and evaluation for decompensated liver cirrhosis.    Assessment and Plan:  Decompensated Alcoholic Liver Cirrhosis  Abdominal pain - Due to progressive alcoholic liver cirrhosis, ascites, abdominal distention -Consulted IR for possible thoracentesis -radiology consulted for paracentesis 04/29/23 -sending fluid for studies, evaluation for SBP -appreciate GI service consultation and recommendations -hopefully can start furosemide/spironolactone when electrolytes are improved   Hypomagnesemia - Replacing with IV magnesium - recheck in AM   Barrett's esophagus without dysplasia - continue PPI  Alcohol Abuser/History of DTs -Monitoring closely, initiate CIWA protocol -continue daily thiamine, folate replacement   Elevated LFTs - Avoid nephrotoxin, including statins -Monitoring LFTs -treating alcohol use and abuse    Latest  Ref Rng & Units 04/28/2023    3:46 AM 09/21/2022   12:25 AM 09/20/2022   12:30 AM  Hepatic Function  Total Protein 6.5 - 8.1 g/dL 7.3  7.5  7.0   Albumin 3.5 - 5.0 g/dL 2.6  2.7  2.3   AST 15 - 41 U/L 174  91  60   ALT 0 - 44 U/L 42  34  23   Alk Phosphatase 38 - 126 U/L 217  187  174   Total Bilirubin 0.3 - 1.2 mg/dL 3.6  1.3  1.2    Dyslipidemia - Holding statins due to elevated LFTs due to alcohol use abuse liver cirrhosis  ETOH abuse -Has been counseled regarding alcohol use/abuse, now developing liver cirrhosis, ascites -Patient has been initiated on CIWA protocol  Acute HFmrEF -Monitor I's and O's -Limit IV fluid resuscitation -Last echo 12/19/2020:  LVEF 45-50%, mildly decreased LV function moderate LVH, grade 1 diastolic dysfunction impaired relaxation  Intake/Output Summary (Last 24 hours) at 04/29/2023 1108 Last data filed at 04/29/2023 0848 Gross per 24 hour  Intake 3 ml  Output 325 ml  Net -322 ml   Filed Weights   04/28/23 0313 04/29/23 0501  Weight: 111.1 kg 117.7 kg   GERD (gastroesophageal reflux disease) Continue PPI  History of Wolff-Parkinson-White syndrome Status post ablation, monitoring  Hypokalemia - additional IV Mg and K given with oral K  - recheck in AM   Essential hypertension - BPs are soft at this time, following closely   DVT prophylaxis: SCDs Code Status: Full  Family Communication:  Disposition: anticipate home when medically stabilized     Consultants:  GI  Procedures:  Paracentesis planned for 8/14   Antimicrobials:    Subjective: Pt  reports that he is very concerned about his umbilical hernia.    Objective: Vitals:   04/29/23 0713 04/29/23 0755 04/29/23 0800 04/29/23 0900  BP:  135/83 130/80 130/76  Pulse: 93 93 92 91  Resp:    10  Temp: 98.6 F (37 C)     TempSrc: Axillary     SpO2: 99%  94% 91%  Weight:      Height:        Intake/Output Summary (Last 24 hours) at 04/29/2023 1059 Last data filed at 04/29/2023  0848 Gross per 24 hour  Intake 3 ml  Output 325 ml  Net -322 ml   Filed Weights   04/28/23 0313 04/29/23 0501  Weight: 111.1 kg 117.7 kg   Examination:  General exam: Appears chronically ill, jaundiced, calm and comfortable  Respiratory system: Clear to auscultation. Respiratory effort normal. Cardiovascular system: normal S1 & S2 heard. No JVD, murmurs, rubs, gallops or clicks. No pedal edema. Gastrointestinal system: Abdomen is moderately to severely distended, reducible umbilical hernia seen; No masses felt. Normal bowel sounds heard. Central nervous system: Alert and oriented. No focal neurological deficits. Extremities: Symmetric 5 x 5 power. Skin: No rashes, lesions or ulcers. Psychiatry: Judgement and insight UTD. Mood & affect appropriate.   Data Reviewed: I have personally reviewed following labs and imaging studies  CBC: Recent Labs  Lab 04/28/23 0346 04/29/23 0507  WBC 9.2 8.0  NEUTROABS 6.7  --   HGB 11.7* 10.7*  HCT 33.8* 31.4*  MCV 92.1 94.9  PLT 126* 105*    Basic Metabolic Panel: Recent Labs  Lab 04/28/23 0346 04/28/23 0945 04/29/23 0507  NA 132* 131* 128*  K 2.4* 2.6* 2.5*  CL 87* 90* 91*  CO2 31 29 30   GLUCOSE 110* 93 69*  BUN 7 7 10   CREATININE 1.19 1.09 1.10  CALCIUM 8.2* 7.8* 8.1*  MG 1.6*  --  1.8    CBG: Recent Labs  Lab 04/28/23 0809 04/29/23 0724  GLUCAP 98 89    Recent Results (from the past 240 hour(s))  MRSA Next Gen by PCR, Nasal     Status: None   Collection Time: 04/29/23 12:25 AM   Specimen: Nasal Mucosa; Nasal Swab  Result Value Ref Range Status   MRSA by PCR Next Gen NOT DETECTED NOT DETECTED Final    Comment: (NOTE) The GeneXpert MRSA Assay (FDA approved for NASAL specimens only), is one component of a comprehensive MRSA colonization surveillance program. It is not intended to diagnose MRSA infection nor to guide or monitor treatment for MRSA infections. Test performance is not FDA approved in patients less  than 29 years old. Performed at [facility], [address] [place], [place] [address]      Radiology Studies: [place] ASCITES (ABDOMEN LIMITED)  Result Date: 04/29/2023 CLINICAL DATA:  Ascites Interventional radiology consulted for paracentesis EXAM: LIMITED ABDOMEN ULTRASOUND FOR ASCITES TECHNIQUE: Limited ultrasound survey for ascites was performed in all four abdominal quadrants. COMPARISON:  CT abdomen pelvis 04/28/2023 FINDINGS: Sonographic evaluation of the abdomen demonstrates trace perihepatic ascites which is insufficient for aspiration. IMPRESSION: Trace perihepatic ascites, insufficient for aspiration. No paracentesis was performed. Electronically Signed   By: Acquanetta Belling M.D.   On: 04/29/2023 09:56   CT ABDOMEN PELVIS W CONTRAST  Result Date: 04/28/2023 CLINICAL DATA:  Bowel obstruction suspected. Acute, nonlocalized abdominal pain. EXAM: CT ABDOMEN AND PELVIS WITH CONTRAST TECHNIQUE: Multidetector CT imaging of the abdomen and pelvis was performed using the standard protocol following bolus administration  of intravenous contrast. RADIATION DOSE REDUCTION: This exam was performed according to the departmental dose-optimization program which includes automated exposure control, adjustment of the mA and/or kV according to patient size and/or use of iterative reconstruction technique. CONTRAST:  OMNIPAQUE IOHEXOL 300 MG/ML  SOLN COMPARISON:  09/24/2021 FINDINGS: Lower chest:  Coronary atherosclerosis. Hepatobiliary: Severe hepatic steatosis. Lobulated liver surface with newly enlarged caudate and further widened fissures.No evidence of biliary obstruction or stone. Pancreas: Unremarkable. Spleen: Unremarkable. Adrenals/Urinary Tract: Negative adrenals. No hydronephrosis or stone. Left renal hilar and cortical cysts with simple appearance measuring up to 3.2 cm at the renal hilum. Unremarkable bladder. Stomach/Bowel: No obstruction. No appendicitis other visible bowel inflammation.  Vascular/Lymphatic: No acute vascular abnormality. Scattered atheromatous calcification. No mass or adenopathy. Reproductive:No pathologic findings. Other: New small volume ascites distributed throughout the abdomen including at the small umbilical hernia. Musculoskeletal: No acute abnormalities. Generalized degeneration with mild scoliosis. IMPRESSION: 1. Severe hepatic steatosis with progressive findings of cirrhosis since 2023. Small volume ascites is newly seen. 2. Extensive atherosclerosis. Electronically Signed   By: Tiburcio Pea M.D.   On: 04/28/2023 05:30    Scheduled Meds:  carvedilol  6.25 mg Oral BID WC   Chlorhexidine Gluconate Cloth  6 each Topical Daily   enoxaparin (LOVENOX) injection  55 mg Subcutaneous Q24H   folic acid  1 mg Oral Daily   furosemide  40 mg Oral Daily   multivitamin with minerals  1 tablet Oral Daily   nicotine  21 mg Transdermal Daily   pantoprazole (PROTONIX) IV  40 mg Intravenous Q12H   polyethylene glycol  17 g Oral Daily   sodium chloride flush  3 mL Intravenous Q12H   sucralfate  1 g Oral BID   thiamine  100 mg Oral Daily   Or   thiamine  100 mg Intravenous Daily   Continuous Infusions:  potassium chloride 10 mEq (04/29/23 1005)     LOS: 1 day   Time spent: 53 mins   Laural Benes, MD How to contact the [facility] Attending or Consulting provider 7A - 7P or covering provider during after hours 7P -7A, for this patient?  Check the care team in [facility] and look for a) attending/consulting [facility] provider listed and b) the [facility] team listed Log into [email or web address] and use [place]'s universal password to access. If you do not have the password, please contact the hospital operator. Locate the [facility] provider you are looking for under [facility] and page to a number that you can be directly reached. If you still have difficulty reaching the provider, please page the [facility] (Director on Call) for the Hospitalists listed on amion for assistance.  04/29/2023,  10:59 AM

## 2023-04-29 NOTE — Progress Notes (Signed)
Subjective: Patient sitting up in chair with family at bedside. He is very drowsy but awakes to verbal stimuli and can answer questions appropriately, notably received dilaudid and ativan just prior to my visit. He is A/ox4. He c/o of continued abdominal discomfort and distention. He has not had any BMs this morning and is not passing flatus anymore.    Objective: Vital signs in last 24 hours: Temp:  [98 F (36.7 C)-98.6 F (37 C)] 98.6 F (37 C) (08/14 0713) Pulse Rate:  [81-103] 91 (08/14 0900) Resp:  [10-21] 10 (08/14 0900) BP: (113-169)/(38-119) 130/76 (08/14 0900) SpO2:  [90 %-99 %] 91 % (08/14 0900) Weight:  [117.7 kg] 117.7 kg (08/14 0501) Last BM Date : 04/29/23 General:   Alert and oriented, pleasant. Drowsy  Head:  Normocephalic and atraumatic. Eyes:  scleral icterus  Mouth:  Without lesions, mucosa pink and moist.  Neck:  Supple, without thyromegaly or masses.  Heart:  S1, S2 present, no murmurs noted.  Lungs: Clear to auscultation bilaterally, without wheezing, rales, or rhonchi.  Abdomen:  Bowel sounds present, abdomen is distended, tender, no rebound or guarding  Msk:  Symmetrical without gross deformities. Normal posture. Pulses:  Normal pulses noted. Extremities:  2+ pitting edema to LEs Neurologic:  Alert and  oriented x4;  no asterixis  Skin:  Warm and dry, intact without significant lesions.  Psych:  Alert and cooperative. Normal mood and affect.  Intake/Output from previous day: 08/13 0701 - 08/14 0700 In: 193.9 [IV Piggyback:193.9] Out: 325 [Urine:325] Intake/Output this shift: Total I/O In: 3 [I.V.:3] Out: -   Lab Results: Recent Labs    04/28/23 0346 04/29/23 0507  WBC 9.2 8.0  HGB 11.7* 10.7*  HCT 33.8* 31.4*  PLT 126* 105*   BMET Recent Labs    04/28/23 0346 04/28/23 0945 04/29/23 0507  NA 132* 131* 128*  K 2.4* 2.6* 2.5*  CL 87* 90* 91*  CO2 31 29 30   GLUCOSE 110* 93 69*  BUN 7 7 10   CREATININE 1.19 1.09 1.10  CALCIUM 8.2* 7.8*  8.1*   LFT Recent Labs    04/28/23 0346 04/28/23 0945 04/29/23 0507  PROT 7.3 6.8 6.6  ALBUMIN 2.6* 2.4* 2.3*  AST 174* 157* 142*  ALT 42 39 34  ALKPHOS 217* 220* 204*  BILITOT 3.6* 3.9* 5.7*   PT/INR Recent Labs    04/29/23 0507  LABPROT 20.2*  INR 1.7*    Studies/Results: CT ABDOMEN PELVIS W CONTRAST  Result Date: 04/28/2023 CLINICAL DATA:  Bowel obstruction suspected. Acute, nonlocalized abdominal pain. EXAM: CT ABDOMEN AND PELVIS WITH CONTRAST TECHNIQUE: Multidetector CT imaging of the abdomen and pelvis was performed using the standard protocol following bolus administration of intravenous contrast. RADIATION DOSE REDUCTION: This exam was performed according to the departmental dose-optimization program which includes automated exposure control, adjustment of the mA and/or kV according to patient size and/or use of iterative reconstruction technique. CONTRAST:  OMNIPAQUE IOHEXOL 300 MG/ML  SOLN COMPARISON:  09/24/2021 FINDINGS: Lower chest:  Coronary atherosclerosis. Hepatobiliary: Severe hepatic steatosis. Lobulated liver surface with newly enlarged caudate and further widened fissures.No evidence of biliary obstruction or stone. Pancreas: Unremarkable. Spleen: Unremarkable. Adrenals/Urinary Tract: Negative adrenals. No hydronephrosis or stone. Left renal hilar and cortical cysts with simple appearance measuring up to 3.2 cm at the renal hilum. Unremarkable bladder. Stomach/Bowel: No obstruction. No appendicitis other visible bowel inflammation. Vascular/Lymphatic: No acute vascular abnormality. Scattered atheromatous calcification. No mass or adenopathy. Reproductive:No pathologic findings. Other: New small volume ascites  distributed throughout the abdomen including at the small umbilical hernia. Musculoskeletal: No acute abnormalities. Generalized degeneration with mild scoliosis. IMPRESSION: 1. Severe hepatic steatosis with progressive findings of cirrhosis since 2023. Small  volume ascites is newly seen. 2. Extensive atherosclerosis. Electronically Signed   By: Tiburcio Pea M.D.   On: 04/28/2023 05:30    Assessment: Rayhaan P. Boston is a 52 year old male with history of CAD, WPW s/p ablation, chronic pain, diastolic heart failure, GERD, HTN, adenomatous colon polyp, gastric ulcers, ulcerative esophagitis, alcohol abuse, who presented to the ED yesterday with c/o abdominal pain/distention, vomiting, difficulty with BMs.  found to have mild anemia with hemoglobin of 11.7, hyponatremia, hypokalemia, elevated LFTs and bilirubin, EtOH 245.  CT A/P with contrast with severe hepatic steatosis with progressive findings of cirrhosis, small volume ascites.  consulted for abdominal pain and cirrhosis.   Generalized abdominal pain/distention: initially thought secondary to constipation and ascites. Paracentesis to be performed today for therapeutic and diagnostic purposes though [place] without enough fluid to be drawn off. His abdomen remains quite distended and he is not passing flatus or stool despite Miralax 17g being initiated yesterday. Will obtain KUB to rule out SBO.   Cirrhosis: findings concerning for Cirrhosis on CT this admission, likely secondary to ETOH abuse, iron studies this admission with normal ferritin. reportedly drinking one 22 oz beer/day, down from 3-4 daily. He is decompensated with ascites, LEE, thrombocytopenia. MELD 3.0 today is 25.   On lasix 40mg  daily as outpatient though this does not appear to be sufficient given reported LEE over the last week and with new onset ascites. Diuretics on hold due to hypokalemia/hyponatremia, will need to be resumed with addition of spironoalctone once electrolytes corrected. Paracentesis planned for today.   He is drowsy but a/ox4 today, had received ativan and dilaudid just prior to our encounter, no obvious asterixis on exam.   Was On carvedilol 3.125mg  BID as outpatient, though increased by hospitalist to 6.25mg  BID here.  BP stable at 130/76, HR 91. Of note, no EVs on recent EGD in January 2024.   No focal liver lesions on CT this admission, but will need dedicated liver imaging for [facility] screening as outpatient. AFP tumor marker is pending.  Anemia/melena/hematochezia: hgb 11.7 on admission, 10.7 this morning. Iron studies with normal ferritin of 149, iron 162, TIBC 194, saturation 84. Folate 3.6, B 12 999. reported an episode of melena as well as rectal bleeding 3 to 4 days ago with no recurrent symptoms.   -Last EGD January 2024 with ulcerative esophagitis with ulceration, no esophageal or gastric varices. -Last TCS in 2014 with TAs, father with history of CRC in his 32s. currently overdue for surveillance.   Will continue with PPI BID, can plan for repeat Colonoscopy +/- EGD as outpatient unless further decline in hgb or persistent overt GI bleeding.   Nausea/vomiting: chronic, since January 2024, EGD with findings as outlined above, likely secondary to uncontrolled reflux/possible gastritis and in setting of ongoing ETOH use and NSAIDs. Not on PPI as outpatient. IV PPI BID started yesterday along with zofran 4mg  Q8H which can be switched to schedule dosing if needed.  Electrolyte imbalance: K+ 2.4 on admission, sodium 132 on admission, sodium 128, and K 2.5 this morning, likely multifactorial. Diuretics on hold until electrolytes corrected, supplementation ordered per hospitalist.    Plan: Obtain KUB  Folate and thiamine supplementation daily MELD labs daily  Lasix 40mg /spironolactone 100mg  daily once electrolytes corrected Continue carvedilol 6.25mg  BID Miralax 17g daily  IV PPI BID Zofran 4mg  Q8h PRN Complete etoh cessation 2g sodium diet Ongoing cirrhosis management as outpatient/HCC screening via [place] EGD/Colonoscopy as outpatient unless overt GI bleeding    LOS: 1 day    04/29/2023, 9:51 AM    L. Jeanmarie Hubert, MSN, APRN, AGNP-C Adult-Gerontology Nurse Practitioner [facility]  at [facility]

## 2023-04-29 NOTE — ED Notes (Signed)
ED TO INPATIENT HANDOFF REPORT  ED Nurse Name and Phone #: Jacques Earthly Name/Age/Gender Timothy [name] 52 y.o. male Room/Bed: APA04/APA04  Code Status   Code Status: Full Code  Home/SNF/Other Home Patient oriented to: self, place, time, and situation Is this baseline? Yes   Triage Complete: Triage complete  Chief Complaint Hypokalemia [E87.6] Abdominal pain [R10.9]  Triage Note Pt bib [facility] from home c/o abdominal pain and swelling x1 year. States his "belly button popped out" and his pain is worse than usual today.   Allergies Allergies  Allergen Reactions   Morphine Other (See Comments)    Makes overly sleepy    Level of Care/Admitting Diagnosis ED Disposition     ED Disposition  Admit   Condition  --   Comment  Hospital Area: [facility] [[id number]]  Level of Care: Stepdown [14]  Covid Evaluation: Asymptomatic - no recent exposure (last 10 days) testing not required  Diagnosis: Abdominal pain [161096]  Admitting Physician: Kendell Bane [[id number]]  Attending Physician: Kendell Bane [phone]  Certification:: I certify this patient will need inpatient services for at least 2 midnights  Expected Medical Readiness: 04/30/2023          B Medical/Surgery History Past Medical History:  Diagnosis Date   Anxiety    Arthritis    CAD (coronary artery disease) 2007   50% stenosis small diagonal   Central sleep apnea    Chronic pain    since 1998 has been on chronic pain medication   Fatty liver    GERD (gastroesophageal reflux disease)    Hemorrhoids    Hepatomegaly 10/03/2014   HTN (hypertension)    Palpitations    Syncope    Tachycardia    Tobacco abuse    Tubular adenoma    Wolff-Parkinson-White (WPW) syndrome    says was cured with ablation   Past Surgical History:  Procedure Laterality Date   ADENOIDECTOMY     APPENDECTOMY     ATRIAL ABLATION SURGERY     AV NODE ABLATION     BIOPSY N/A 05/12/2013   Procedure: BIOPSY;   Surgeon: Corbin Ade, MD;  Location: [facility];  Service: Endoscopy;  Laterality: N/A;   BIOPSY  12/20/2020   Procedure: BIOPSY;  Surgeon: Corbin Ade, MD;  Location: [facility];  Service: Endoscopy;;   BIOPSY  09/18/2022   Procedure: BIOPSY;  Surgeon: Tressia Danas, MD;  Location: [facility] ENDOSCOPY;  Service: Gastroenterology;;   CARDIAC CATHETERIZATION N/A 05/08/2016   Procedure: Right/Left Heart Cath and Coronary Angiography;  Surgeon: Peter M Swaziland, MD;  Location: [facility] INVASIVE CV LAB;  Service: Cardiovascular;  Laterality: N/A;   CARDIAC ELECTROPHYSIOLOGY STUDY AND ABLATION     SA node ablation   COLONOSCOPY     age 71   COLONOSCOPY WITH PROPOFOL N/A 05/12/2013   Dr. Jena Gauss- hemorrhoids, tubular adenoma   ESOPHAGEAL DILATION  12/20/2020   Procedure: ESOPHAGEAL DILATION;  Surgeon: Corbin Ade, MD;  [facility];  Service: Endoscopy;;   ESOPHAGEAL DILATION N/A 12/20/2020   Procedure: ESOPHAGEAL DILATION;  Surgeon: Corbin Ade, MD;  [facility];  Service: Endoscopy;  Laterality: N/A;   ESOPHAGOGASTRODUODENOSCOPY (EGD) WITH PROPOFOL N/A 05/12/2013   Dr. Jena Gauss- hiatal hernia, erosive reflux esophagitis   ESOPHAGOGASTRODUODENOSCOPY (EGD) WITH PROPOFOL N/A 12/20/2020   Procedure: ESOPHAGOGASTRODUODENOSCOPY (EGD) WITH PROPOFOL;  Surgeon: Corbin Ade, MD;  [facility];  Service: Endoscopy;  Laterality: N/A;   ESOPHAGOGASTRODUODENOSCOPY (EGD)  WITH PROPOFOL N/A 09/18/2022   Procedure: ESOPHAGOGASTRODUODENOSCOPY (EGD) WITH PROPOFOL;  Surgeon: Tressia Danas, MD;  Location: [facility] ENDOSCOPY;  Service: Gastroenterology;  Laterality: N/A;   HAND SURGERY     HEMORRHOID BANDING  06/22/13   POLYPECTOMY N/A 05/12/2013   Procedure: POLYPECTOMY;  Surgeon: Corbin Ade, MD;  Location: [facility];  Service: Endoscopy;  Laterality: N/A;   SHOULDER SURGERY       A IV Location/Drains/Wounds Patient Lines/Drains/Airways Status     Active Line/Drains/Airways     Name  Placement date Placement time Site Days   Peripheral IV 04/28/23 Anterior;Proximal;Right Forearm 04/28/23  0414  Forearm  1            Intake/Output Last 24 hours  Intake/Output Summary (Last 24 hours) at 04/29/2023 0009 Last data filed at 04/28/2023 2148 Gross per 24 hour  Intake 1343.9 ml  Output 325 ml  Net 1018.9 ml    Labs/Imaging Results for orders placed or performed during the hospital encounter of 04/28/23 (from the past 48 hour(s))  CBC with Differential     Status: Abnormal   Collection Time: 04/28/23  3:46 AM  Result Value Ref Range   WBC 9.2 4.0 - 10.5 K/uL   RBC 3.67 (L) 4.22 - 5.81 MIL/uL   Hemoglobin 11.7 (L) 13.0 - 17.0 g/dL   HCT 95.2 (L) 84.1 - 32.4 %   MCV 92.1 80.0 - 100.0 fL   MCH 31.9 26.0 - 34.0 pg   MCHC 34.6 30.0 - 36.0 g/dL   RDW 40.1 (H) 02.7 - 25.3 %   Platelets 126 (L) 150 - 400 K/uL   nRBC 0.0 0.0 - 0.2 %   Neutrophils Relative % 73 %   Neutro Abs 6.7 1.7 - 7.7 K/uL   Lymphocytes Relative 16 %   Lymphs Abs 1.5 0.7 - 4.0 K/uL   Monocytes Relative 9 %   Monocytes Absolute 0.8 0.1 - 1.0 K/uL   Eosinophils Relative 1 %   Eosinophils Absolute 0.1 0.0 - 0.5 K/uL   Basophils Relative 1 %   Basophils Absolute 0.1 0.0 - 0.1 K/uL   Immature Granulocytes 0 %   Abs Immature Granulocytes 0.02 0.00 - 0.07 K/uL    Comment: Performed at [facility], [address], [place], [place] [address]  Comprehensive metabolic panel     Status: Abnormal   Collection Time: 04/28/23  3:46 AM  Result Value Ref Range   Sodium 132 (L) 135 - 145 mmol/L   Potassium 2.4 (LL) 3.5 - 5.1 mmol/L    Comment: CRITICAL RESULT CALLED TO, READ BACK BY AND VERIFIED WITH ,C AT 4:40AM ON 04/28/23 BY FESTERMAN,C   Chloride 87 (L) 98 - 111 mmol/L   CO2 31 22 - 32 mmol/L   Glucose, Bld 110 (H) 70 - 99 mg/dL    Comment: Glucose reference range applies only to samples taken after fasting for at least 8 hours.   BUN 7 6 - 20 mg/dL   Creatinine, Ser 3.47 0.61 - 1.24 mg/dL    Calcium 8.2 (L) 8.9 - 10.3 mg/dL   Total Protein 7.3 6.5 - 8.1 g/dL   Albumin 2.6 (L) 3.5 - 5.0 g/dL   AST 425 (H) 15 - 41 U/L   ALT 42 0 - 44 U/L   Alkaline Phosphatase 217 (H) 38 - 126 U/L   Total Bilirubin 3.6 (H) 0.3 - 1.2 mg/dL   GFR, Estimated >95 >63 mL/min    Comment: (NOTE) Calculated using the CKD-EPI Creatinine  Equation (2021)    Anion gap 14 5 - 15    Comment: Performed at [facility], [address] [place], [place] [address]  Lipase, blood     Status: Abnormal   Collection Time: 04/28/23  3:46 AM  Result Value Ref Range   Lipase 53 (H) 11 - 51 U/L    Comment: Performed at [facility], [address] [place], [place] [address]  Ethanol     Status: Abnormal   Collection Time: 04/28/23  3:46 AM  Result Value Ref Range   Alcohol, Ethyl (B) 245 (H) <10 mg/dL    Comment: (NOTE) Lowest detectable limit for serum alcohol is 10 mg/dL.  For medical purposes only. Performed at [facility], [address] [place], [place] [address]   Magnesium     Status: Abnormal   Collection Time: 04/28/23  3:46 AM  Result Value Ref Range   Magnesium 1.6 (L) 1.7 - 2.4 mg/dL    Comment: Performed at [facility], [address] [place], [place] [address]  CBG monitoring, ED     Status: None   Collection Time: 04/28/23  8:09 AM  Result Value Ref Range   Glucose-Capillary 98 70 - 99 mg/dL    Comment: Glucose reference range applies only to samples taken after fasting for at least 8 hours.  Comprehensive metabolic panel     Status: Abnormal   Collection Time: 04/28/23  9:45 AM  Result Value Ref Range   Sodium 131 (L) 135 - 145 mmol/L   Potassium 2.6 (LL) 3.5 - 5.1 mmol/L    Comment: CRITICAL RESULT CALLED TO, READ BACK BY AND VERIFIED WITH BAIN,C AT 10:05AM ON 04/28/23 BY FESTERMAN,C   Chloride 90 (L) 98 - 111 mmol/L   CO2 29 22 - 32 mmol/L   Glucose, Bld 93 70 - 99 mg/dL    Comment: Glucose reference range applies only to samples taken after fasting for at least 8 hours.   BUN 7 6  - 20 mg/dL   Creatinine, Ser 2.95 0.61 - 1.24 mg/dL   Calcium 7.8 (L) 8.9 - 10.3 mg/dL   Total Protein 6.8 6.5 - 8.1 g/dL   Albumin 2.4 (L) 3.5 - 5.0 g/dL   AST 621 (H) 15 - 41 U/L   ALT 39 0 - 44 U/L   Alkaline Phosphatase 220 (H) 38 - 126 U/L   Total Bilirubin 3.9 (H) 0.3 - 1.2 mg/dL   GFR, Estimated >30 >86 mL/min    Comment: (NOTE) Calculated using the CKD-EPI Creatinine Equation (2021)    Anion gap 12 5 - 15    Comment: Performed at [facility], [address] [place], [place] [address]   CT ABDOMEN PELVIS W CONTRAST  Result Date: 04/28/2023 CLINICAL DATA:  Bowel obstruction suspected. Acute, nonlocalized abdominal pain. EXAM: CT ABDOMEN AND PELVIS WITH CONTRAST TECHNIQUE: Multidetector CT imaging of the abdomen and pelvis was performed using the standard protocol following bolus administration of intravenous contrast. RADIATION DOSE REDUCTION: This exam was performed according to the departmental dose-optimization program which includes automated exposure control, adjustment of the mA and/or kV according to patient size and/or use of iterative reconstruction technique. CONTRAST:  OMNIPAQUE IOHEXOL 300 MG/ML  SOLN COMPARISON:  09/24/2021 FINDINGS: Lower chest:  Coronary atherosclerosis. Hepatobiliary: Severe hepatic steatosis. Lobulated liver surface with newly enlarged caudate and further widened fissures.No evidence of biliary obstruction or stone. Pancreas: Unremarkable. Spleen: Unremarkable. Adrenals/Urinary Tract: Negative adrenals. No hydronephrosis or stone. Left renal hilar and cortical cysts  with simple appearance measuring up to 3.2 cm at the renal hilum. Unremarkable bladder. Stomach/Bowel: No obstruction. No appendicitis other visible bowel inflammation. Vascular/Lymphatic: No acute vascular abnormality. Scattered atheromatous calcification. No mass or adenopathy. Reproductive:No pathologic findings. Other: New small volume ascites distributed throughout the abdomen  including at the small umbilical hernia. Musculoskeletal: No acute abnormalities. Generalized degeneration with mild scoliosis. IMPRESSION: 1. Severe hepatic steatosis with progressive findings of cirrhosis since 2023. Small volume ascites is newly seen. 2. Extensive atherosclerosis. Electronically Signed   By: Tiburcio Pea M.D.   On: 04/28/2023 05:30    Pending Labs Unresulted Labs (From admission, onward)     Start     Ordered   04/29/23 0500  CBC  Daily,   R      04/28/23 0740   04/29/23 0500  Protime-INR  Tomorrow morning,   R        04/28/23 0740   04/29/23 0500  APTT  Tomorrow morning,   R        04/28/23 0740   04/29/23 0500  Comprehensive metabolic panel  Daily,   R      04/28/23 0740   04/29/23 0500  AFP tumor marker  Tomorrow morning,   R        04/28/23 1237   04/29/23 0500  Iron and TIBC  Tomorrow morning,   R        04/28/23 1237   04/29/23 0500  Ferritin  Tomorrow morning,   R        04/28/23 1237   04/29/23 0500  Vitamin B12  Tomorrow morning,   R        04/28/23 1237   04/29/23 0500  Folate  Tomorrow morning,   R        04/28/23 1237            Vitals/Pain Today's Vitals   04/28/23 2300 04/28/23 2330 04/28/23 2330 04/29/23 0000  BP: (!) 155/100 (!) 150/97 (!) 150/97 (!) 142/94  Pulse: 84 82 87 83  Resp: 14 18  18   Temp:      TempSrc:      SpO2: 90% 92%  92%  Weight:      Height:      PainSc:        Isolation Precautions No active isolations  Medications Medications  LORazepam (ATIVAN) tablet 1-4 mg (1 mg Oral Given 04/28/23 2334)    Or  LORazepam (ATIVAN) injection 1-4 mg ( Intravenous See Alternative 04/28/23 2334)  thiamine (VITAMIN B1) tablet 100 mg (100 mg Oral Given 04/28/23 0914)    Or  thiamine (VITAMIN B1) injection 100 mg ( Intravenous See Alternative 04/28/23 0914)  folic acid (FOLVITE) tablet 1 mg (1 mg Oral Given 04/28/23 0914)  multivitamin with minerals tablet 1 tablet (1 tablet Oral Given 04/28/23 0914)  sodium chloride flush (NS)  0.9 % injection 3 mL (3 mLs Intravenous Given 04/28/23 2157)  HYDROmorphone (DILAUDID) injection 0.5-1 mg (1 mg Intravenous Given 04/28/23 2347)  zolpidem (AMBIEN) tablet 5 mg (has no administration in time range)  senna-docusate (Senokot-S) tablet 1 tablet (has no administration in time range)  bisacodyl (DULCOLAX) EC tablet 5 mg (has no administration in time range)  sodium phosphate (FLEET) enema 1 enema (has no administration in time range)  ondansetron (ZOFRAN) tablet 4 mg ( Oral See Alternative 04/28/23 2036)    Or  ondansetron (ZOFRAN) injection 4 mg (4 mg Intravenous Given 04/28/23 2036)  ipratropium (ATROVENT) nebulizer solution 0.5 mg (has  no administration in time range)  levalbuterol (XOPENEX) nebulizer solution 0.63 mg (has no administration in time range)  hydrALAZINE (APRESOLINE) injection 10 mg (has no administration in time range)  lactated ringers infusion (0 mLs Intravenous Stopped 04/28/23 2015)  oxyCODONE (Oxy IR/ROXICODONE) immediate release tablet 15 mg (15 mg Oral Given 04/28/23 0816)  hydrOXYzine (ATARAX) tablet 25 mg (25 mg Oral Given 04/28/23 0814)  nicotine (NICODERM CQ - dosed in mg/24 hours) patch 21 mg (21 mg Transdermal Patch Applied 04/28/23 0913)  sucralfate (CARAFATE) 1 GM/10ML suspension 1 g (1 g Oral Given 04/28/23 2159)  furosemide (LASIX) tablet 40 mg (has no administration in time range)  carvedilol (COREG) tablet 6.25 mg (6.25 mg Oral Given 04/28/23 1620)  ibuprofen (ADVIL) tablet 600 mg (has no administration in time range)  enoxaparin (LOVENOX) injection 55 mg (55 mg Subcutaneous Given 04/28/23 0913)  pantoprazole (PROTONIX) injection 40 mg (40 mg Intravenous Given 04/28/23 2158)  polyethylene glycol (MIRALAX / GLYCOLAX) packet 17 g (17 g Oral Given 04/28/23 1256)  ondansetron (ZOFRAN) injection 4 mg (4 mg Intravenous Given 04/28/23 0414)  fentaNYL (SUBLIMAZE) injection 100 mcg (100 mcg Intravenous Given 04/28/23 0414)  potassium chloride SA (KLOR-CON M) CR tablet  40 mEq (40 mEq Oral Given 04/28/23 0521)  potassium chloride 10 mEq in 100 mL IVPB (0 mEq Intravenous Stopped 04/28/23 0914)  magnesium sulfate IVPB 2 g 50 mL (0 g Intravenous Stopped 04/28/23 0559)  iohexol (OMNIPAQUE) 300 MG/ML solution 100 mL (100 mLs Intravenous Contrast Given 04/28/23 0458)  sodium chloride 0.9 % bolus 1,000 mL (0 mLs Intravenous Stopped 04/28/23 0656)  fentaNYL (SUBLIMAZE) injection 100 mcg (100 mcg Intravenous Given 04/28/23 1610)  potassium chloride SA (KLOR-CON M) CR tablet 40 mEq (40 mEq Oral Given 04/28/23 1138)    Mobility walks with person assist     Focused Assessments    R Recommendations: See Admitting Provider Note  Report given to:   Additional Notes: A&O; continent; 20G RAC

## 2023-04-29 NOTE — Progress Notes (Addendum)
Physical Therapy Treatment Patient Details Name: Timothy [name] MRN: [id number] DOB: [birth year] Today's Date: 04/29/2023   History of Present Illness Timothy [name] is a 52 year old male with history of HTN,  Barrett esophagus, PUD, WPW status post ablation, NICM, alcohol abuse, and liver disease who presents with worsening abdominal distention and pain.  For past 2-3 days.  Reporting his bellybutton is popping out now.  Voiding and nonbilious nonbloody emesis difficulty having bowel movements.    PT Comments  Pt presented to therapy reporting abdominal pain and discomfort with observed distention of abdomen. Bed mobility and transfers observed to be labored, requiring min assist from therapist and RW. Heavy verbal and tactile cueing throughout session. Ambulation participation limited due to abdominal pain and fatigue. Patient will benefit from continued skilled physical therapy in hospital and recommended venue below to increase strength, balance, endurance for safe ADLs and gait.       If plan is discharge home, recommend the following: A lot of help with walking and/or transfers;A lot of help with bathing/dressing/bathroom;Help with stairs or ramp for entrance   Can travel by private vehicle     Yes  Equipment Recommendations  None recommended by PT    Recommendations for Other Services       Precautions / Restrictions Precautions Precautions: Fall Restrictions Weight Bearing Restrictions: No     Mobility  Bed Mobility Overal bed mobility: Needs Assistance Bed Mobility: Supine to Sit     Supine to sit: Min assist Sit to supine: Min assist   General bed mobility comments: increased time and labored movement; hand held assist and use of bedrails    Transfers Overall transfer level: Needs assistance Equipment used: Rolling walker (2 wheels) Transfers: Sit to/from Stand, Bed to chair/wheelchair/BSC Sit to Stand: Min assist   Step pivot transfers: Min assist        General transfer comment: Unsteady in standing; extended time and labored effort.    Ambulation/Gait Ambulation/Gait assistance: Min assist Gait Distance (Feet): 5 Feet (Ambulated a short distance from bed to chair with RW) Assistive device: Rolling walker (2 wheels) Gait Pattern/deviations: Step-to pattern, Decreased step length - right, Decreased step length - left, Decreased stride length, Shuffle Gait velocity: slow     General Gait Details: Extended time to perform, labored effort throughout. limited due to abdominal pain and fatigue   Stairs             Wheelchair Mobility     Tilt Bed    Modified Rankin (Stroke Patients Only)       Balance Overall balance assessment: Needs assistance Sitting-balance support: Feet supported, Bilateral upper extremity supported Sitting balance-Leahy Scale: Fair Sitting balance - Comments: seated at EOB   Standing balance support: Bilateral upper extremity supported, During functional activity, Reliant on assistive device for balance Standing balance-Leahy Scale: Fair Standing balance comment: using RW                            Cognition                                                Exercises General Exercises - Lower Extremity Ankle Circles/Pumps: AROM, Seated, Strengthening, Both, 10 reps Long Arc Quad: AROM, Seated, Strengthening, Both, 10 reps Hip Flexion/Marching: AROM, Seated, Strengthening, Both, 10 reps Toe Raises:  AROM, Seated, Strengthening, Both, 10 reps Heel Raises: AROM, Seated, Strengthening, Both, 10 reps    General Comments        Pertinent Vitals/Pain Pain Assessment Pain Assessment: Faces Faces Pain Scale: Hurts even more Pain Location: abdominal area Pain Descriptors / Indicators: Grimacing, Guarding, Discomfort, Moaning Pain Intervention(s): Limited activity within patient's tolerance, Monitored during session    Home Living                           Prior Function            PT Goals (current goals can now be found in the care plan section) Acute Rehab PT Goals Patient Stated Goal: return home PT Goal Formulation: With patient Time For Goal Achievement: 05/12/23 Potential to Achieve Goals: Good Progress towards PT goals: Progressing toward goals    Frequency    Min 3X/week      PT Plan      Co-evaluation              AM-PAC PT "6 Clicks" Mobility   Outcome Measure  Help needed turning from your back to your side while in a flat bed without using bedrails?: A Little Help needed moving from lying on your back to sitting on the side of a flat bed without using bedrails?: A Little Help needed moving to and from a bed to a chair (including a wheelchair)?: A Lot Help needed standing up from a chair using your arms (e.g., wheelchair or bedside chair)?: A Lot Help needed to walk in hospital room?: A Lot Help needed climbing 3-5 steps with a railing? : A Lot 6 Click Score: 14    End of Session   Activity Tolerance: Patient limited by pain;Patient limited by fatigue Patient left: with call bell/phone within reach;in chair Nurse Communication: Mobility status PT Visit Diagnosis: Unsteadiness on feet (R26.81);Other abnormalities of gait and mobility (R26.89);Muscle weakness (generalized) (M62.81)     Time: 4782-9562 PT Time Calculation (min) (ACUTE ONLY): 28 min  Charges:    $Therapeutic Exercise: 8-22 mins $Therapeutic Activity: 8-22 mins PT General Charges $$ ACUTE PT VISIT: 1 Visit                       [facility], DPT Program

## 2023-04-29 NOTE — [place] FL2 (Signed)
[place] MEDICAID FL2 LEVEL OF CARE FORM     IDENTIFICATION  Patient Name: Timothy [name] Birthdate: [birth year] Sex: male Admission Date (Current Location): 04/28/2023  [facility] and [place] Number:  [employer] and Address:  [facility],  [address] [place] [address]      Provider Number: [id number]  Attending Physician Name and Address:  Cleora Fleet, MD  Relative Name and Phone Number:  Timothy [name], Timothy [name] (Son)  [phone]    Current Level of Care: Hospital Recommended Level of Care: Skilled Nursing Facility Prior Approval Number:    Date Approved/Denied:   PASRR Number: [id number]  Discharge Plan: SNF    Current Diagnoses: Patient Active Problem List   Diagnosis Date Noted   Abdominal pain 04/28/2023   Alcoholic cirrhosis of liver with ascites (HCC) 04/28/2023   Hypomagnesemia 04/28/2023   Hematemesis with nausea 09/17/2022   Barrett's esophagus without dysplasia 02/09/2021   PUD (peptic ulcer disease)/Gastric Ulcers---etoh and Nsaid related 12/20/2020   Alcohol withdrawal/DTs 12/19/2020   Non-intractable vomiting    Dysphagia    Elevated LFTs    Hyperbilirubinemia    Chronic combined systolic (congestive) and diastolic (congestive) heart failure (HCC) 05/08/2016   Acute CHF (congestive heart failure) (HCC) 04/18/2016   ETOH abuse 04/18/2016   RBBB 04/18/2016   Cardiomyopathy (HCC) 04/18/2016   Family history of coronary artery disease in father 04/18/2016   Dyslipidemia 04/18/2016   Elevated brain natriuretic peptide (BNP) level    Acute on chronic diastolic heart failure (HCC)    Interstitial edema    Hemoptysis 04/11/2016   Orthopnea 04/11/2016   Elevated troponin I level    Stroke (HCC) 10/28/2014   Hypertensive emergency 10/28/2014   Left sided numbness 10/28/2014   Hepatomegaly 10/03/2014   GERD (gastroesophageal reflux disease) 07/13/2013   Hemorrhoids 07/13/2013   Abdominal pain, chronic, right upper quadrant  05/03/2013   Bowel habit changes 05/03/2013   FH: colon cancer 05/03/2013   Rectal bleeding 05/03/2013   Hypokalemia 03/08/2013   History of Wolff-Parkinson-White syndrome 03/08/2013   Malignant hypertension 03/08/2013   Polycythemia 03/08/2013   Tobacco abuse 03/08/2013   Chronic anxiety 03/08/2013   Leukocytosis 03/08/2013   Laceration of arm 03/08/2013   Chest tightness 03/08/2013   CAD (coronary artery disease) 08/11/2012   Smoking 08/11/2012   NIGHT SWEATS 10/23/2010   Anxiety state 12/23/2008   Essential hypertension 12/23/2008   SYNCOPE 12/23/2008   Palpitations 12/23/2008   TACHYCARDIA, HX OF 12/23/2008    Orientation RESPIRATION BLADDER Height & Weight     Self, Time, Situation, Place  Normal Continent Weight: 259 lb 7.7 oz (117.7 kg) Height:  6' (182.9 cm)  BEHAVIORAL SYMPTOMS/MOOD NEUROLOGICAL BOWEL NUTRITION STATUS    Convulsions/Seizures Continent Diet (DYS 2)  AMBULATORY STATUS COMMUNICATION OF NEEDS Skin   Extensive Assist Verbally Normal                       Personal Care Assistance Level of Assistance  Bathing, Feeding, Dressing Bathing Assistance: Limited assistance Feeding assistance: Limited assistance Dressing Assistance: Limited assistance     Functional Limitations Info  Sight, Hearing, Speech Sight Info: Adequate Hearing Info: Adequate Speech Info: Adequate    SPECIAL CARE FACTORS FREQUENCY  PT (By licensed PT)     PT Frequency: 5x/week              Contractures Contractures Info: Not present    Additional Factors Info  Code Status, Allergies Code Status Info:  Full Code Allergies Info: Morphine           Current Medications (04/29/2023):  This is the current hospital active medication list Current Facility-Administered Medications  Medication Dose Route Frequency Provider Last Rate Last Admin   bisacodyl (DULCOLAX) EC tablet 5 mg  5 mg Oral Daily PRN Shahmehdi, Seyed A, MD       carvedilol (COREG) tablet 6.25 mg   6.25 mg Oral BID WC Johnson, Clanford L, MD   6.25 mg at 04/29/23 0755   Chlorhexidine Gluconate Cloth 2 % PADS 6 each  6 each Topical Daily Shahmehdi, Seyed A, MD   6 each at 04/29/23 0847   enoxaparin (LOVENOX) injection 55 mg  55 mg Subcutaneous Q24H Shahmehdi, Seyed A, MD   55 mg at 04/29/23 0847   folic acid (FOLVITE) tablet 1 mg  1 mg Oral Daily Horton, Mayer Masker, MD   1 mg at 04/29/23 0846   furosemide (LASIX) tablet 40 mg  40 mg Oral Daily Shahmehdi, Seyed A, MD   40 mg at 04/29/23 0846   HYDROmorphone (DILAUDID) injection 0.5-1 mg  0.5-1 mg Intravenous Q2H PRN Nevin Bloodgood A, MD   1 mg at 04/29/23 1153   hydrOXYzine (ATARAX) tablet 25 mg  25 mg Oral TID PRN Nevin Bloodgood A, MD   25 mg at 04/29/23 0040   ibuprofen (ADVIL) tablet 600 mg  600 mg Oral Q8H PRN Johnson, Clanford L, MD       ipratropium (ATROVENT) nebulizer solution 0.5 mg  0.5 mg Nebulization Q6H PRN Shahmehdi, Seyed A, MD       levalbuterol (XOPENEX) nebulizer solution 0.63 mg  0.63 mg Nebulization Q6H PRN Shahmehdi, Seyed A, MD       LORazepam (ATIVAN) tablet 1-4 mg  1-4 mg Oral Q1H PRN Shon Baton, MD   1 mg at 04/29/23 0103   Or   LORazepam (ATIVAN) injection 1-4 mg  1-4 mg Intravenous Q1H PRN Shon Baton, MD   1 mg at 04/29/23 1319   multivitamin with minerals tablet 1 tablet  1 tablet Oral Daily Horton, Mayer Masker, MD   1 tablet at 04/29/23 0846   nicotine (NICODERM CQ - dosed in mg/24 hours) patch 21 mg  21 mg Transdermal Daily Shahmehdi, Seyed A, MD   21 mg at 04/29/23 0847   ondansetron (ZOFRAN) tablet 4 mg  4 mg Oral Q6H PRN Shahmehdi, Seyed A, MD       Or   ondansetron (ZOFRAN) injection 4 mg  4 mg Intravenous Q6H PRN Shahmehdi, Seyed A, MD   4 mg at 04/28/23 2036   oxyCODONE (Oxy IR/ROXICODONE) immediate release tablet 15 mg  15 mg Oral Q6H PRN Shahmehdi, Seyed A, MD   15 mg at 04/29/23 1318   pantoprazole (PROTONIX) injection 40 mg  40 mg Intravenous Q12H Ermalinda Memos S, PA-C   40 mg at  04/29/23 0847   polyethylene glycol (MIRALAX / GLYCOLAX) packet 17 g  17 g Oral Daily Ermalinda Memos S, PA-C   17 g at 04/29/23 0847   potassium chloride SA (KLOR-CON M) CR tablet 40 mEq  40 mEq Oral Once Johnson, Clanford L, MD       senna-docusate (Senokot-S) tablet 1 tablet  1 tablet Oral QHS PRN Shahmehdi, Seyed A, MD       sodium chloride flush (NS) 0.9 % injection 3 mL  3 mL Intravenous Q12H Shahmehdi, Seyed A, MD   3 mL at 04/29/23 0848   sodium  phosphate (FLEET) enema 1 enema  1 enema Rectal Once PRN Shahmehdi, Seyed A, MD       sucralfate (CARAFATE) 1 GM/10ML suspension 1 g  1 g Oral BID Shahmehdi, Seyed A, MD   1 g at 04/29/23 0846   thiamine (VITAMIN B1) tablet 100 mg  100 mg Oral Daily Horton, Mayer Masker, MD   100 mg at 04/29/23 7425   Or   thiamine (VITAMIN B1) injection 100 mg  100 mg Intravenous Daily Horton, Mayer Masker, MD       zolpidem (AMBIEN) tablet 5 mg  5 mg Oral QHS PRN,MR X 1 Shahmehdi, Seyed A, MD   5 mg at 04/29/23 9563     Discharge Medications: Please see discharge summary for a list of discharge medications.  Relevant Imaging Results:  Relevant Lab Results:   Additional Information SSN [id number]  Annice Needy, LCSW

## 2023-04-30 ENCOUNTER — Inpatient Hospital Stay (HOSPITAL_COMMUNITY): Payer: 59

## 2023-04-30 DIAGNOSIS — R1084 Generalized abdominal pain: Secondary | ICD-10-CM | POA: Diagnosis not present

## 2023-04-30 DIAGNOSIS — K7031 Alcoholic cirrhosis of liver with ascites: Secondary | ICD-10-CM | POA: Diagnosis not present

## 2023-04-30 DIAGNOSIS — K7030 Alcoholic cirrhosis of liver without ascites: Secondary | ICD-10-CM | POA: Diagnosis not present

## 2023-04-30 LAB — COMPREHENSIVE METABOLIC PANEL
ALT: 36 U/L (ref 0–44)
AST: 152 U/L — ABNORMAL HIGH (ref 15–41)
Albumin: 2.4 g/dL — ABNORMAL LOW (ref 3.5–5.0)
Alkaline Phosphatase: 207 U/L — ABNORMAL HIGH (ref 38–126)
Anion gap: 9 (ref 5–15)
BUN: 13 mg/dL (ref 6–20)
CO2: 29 mmol/L (ref 22–32)
Calcium: 8.1 mg/dL — ABNORMAL LOW (ref 8.9–10.3)
Chloride: 91 mmol/L — ABNORMAL LOW (ref 98–111)
Creatinine, Ser: 1.22 mg/dL (ref 0.61–1.24)
GFR, Estimated: >60 mL/min (ref >60)
Glucose, Bld: 80 mg/dL (ref 70–99)
Potassium: 2.9 mmol/L — ABNORMAL LOW (ref 3.5–5.1)
Sodium: 129 mmol/L — ABNORMAL LOW (ref 135–145)
Total Bilirubin: 5.9 mg/dL — ABNORMAL HIGH (ref 0.3–1.2)
Total Protein: 6.8 g/dL (ref 6.5–8.1)

## 2023-04-30 LAB — GLUCOSE, CAPILLARY: Glucose-Capillary: 74 mg/dL (ref 70–99)

## 2023-04-30 LAB — CBC
HCT: 32.5 % — ABNORMAL LOW (ref 39.0–52.0)
Hemoglobin: 11 g/dL — ABNORMAL LOW (ref 13.0–17.0)
MCH: 32.7 pg (ref 26.0–34.0)
MCHC: 33.8 g/dL (ref 30.0–36.0)
MCV: 96.7 fL (ref 80.0–100.0)
Platelets: 107 10*3/uL — ABNORMAL LOW (ref 150–400)
RBC: 3.36 MIL/uL — ABNORMAL LOW (ref 4.22–5.81)
RDW: 18.7 % — ABNORMAL HIGH (ref 11.5–15.5)
WBC: 7.6 10*3/uL (ref 4.0–10.5)
nRBC: 0.3 % — ABNORMAL HIGH (ref 0.0–0.2)

## 2023-04-30 LAB — PROTIME-INR
INR: 1.5 — ABNORMAL HIGH (ref 0.8–1.2)
Prothrombin Time: 18.4 s — ABNORMAL HIGH (ref 11.4–15.2)

## 2023-04-30 LAB — MAGNESIUM: Magnesium: 2 mg/dL (ref 1.7–2.4)

## 2023-04-30 MED ORDER — LINACLOTIDE 145 MCG PO CAPS
290.0000 ug | ORAL_CAPSULE | Freq: Every day | ORAL | Status: DC
  Administered 2023-04-30 – 2023-05-12 (×12): 290 ug via ORAL
  Filled 2023-04-30 (×14): qty 2

## 2023-04-30 MED ORDER — ORAL CARE MOUTH RINSE: 15.0000 mL | OROMUCOSAL | Status: DC | PRN

## 2023-04-30 MED ORDER — POTASSIUM CHLORIDE CRYS ER 20 MEQ PO TBCR
60.0000 meq | EXTENDED_RELEASE_TABLET | Freq: Once | ORAL | Status: AC
  Administered 2023-04-30: 60 meq via ORAL
  Filled 2023-04-30: qty 3

## 2023-04-30 MED ORDER — IBUPROFEN 400 MG PO TABS: 600.0000 mg | ORAL_TABLET | Freq: Three times a day (TID) | ORAL | Status: DC | PRN

## 2023-04-30 MED ORDER — OXYCODONE HCL 5 MG PO TABS
15.0000 mg | ORAL_TABLET | Freq: Four times a day (QID) | ORAL | Status: DC | PRN
  Administered 2023-04-30 – 2023-05-12 (×34): 15 mg via ORAL
  Filled 2023-04-30 (×34): qty 3

## 2023-04-30 MED ORDER — HYDROMORPHONE HCL 1 MG/ML IJ SOLN
0.5000 mg | INTRAMUSCULAR | Status: DC | PRN
  Administered 2023-04-30 – 2023-05-02 (×12): 0.5 mg via INTRAVENOUS
  Filled 2023-04-30 (×12): qty 0.5

## 2023-04-30 NOTE — Plan of Care (Signed)
  Problem: Health Behavior/Discharge Planning: Goal: Ability to manage health-related needs will improve Outcome: Not Progressing   Problem: Clinical Measurements: Goal: Cardiovascular complication will be avoided Outcome: Progressing   Problem: Coping: Goal: Level of anxiety will decrease Outcome: Not Progressing   Problem: Safety: Goal: Ability to remain free from injury will improve Outcome: Progressing

## 2023-04-30 NOTE — Progress Notes (Signed)
PROGRESS NOTE   Timothy [name]  ZOX:[id number] DOB: 1971-02-16 DOA: 04/28/2023 PCP: Elfredia Nevins, MD   Chief Complaint  Patient presents with   Abdominal Pain   Level of care: Stepdown  Brief Admission History:  Timothy [name] is a 52 year old male with history of HTN, Barrett esophagus, PUD, WPW status post ablation, NICM, alcohol abuse, and liver disease who presents with worsening abdominal distention and pain.  For past 2-3 days prior to arrival he is reporting his bellybutton is popping out now.  Voiding and nonbilious nonbloody emesis difficulty having bowel movements.   ED: Blood pressure (!) 153/88, pulse (!) 111, temperature 98.5 F (36.9 C), resp. rate 11, height 6' (1.829 m), weight 111.1 kg, SpO2 95%.  He is found to have potassium of 2.4, AST 179, total bilirubin 3.6, platelets 126, and prolonged QT interval.  CT demonstrates progressive cirrhotic changes and new small volume ascites.  He was treated with fentanyl, magnesium and potassium, Zofran, and a liter of saline in the ED.  ED requesting admission for further electrolyte correction and evaluation for decompensated liver cirrhosis.    Assessment and Plan:  Decompensated Alcoholic Liver Cirrhosis  Generalized Abdominal pain - Due to progressive alcoholic liver cirrhosis, ascites, abdominal distention -Consulted IR for possible thoracentesis -radiology consulted for paracentesis 04/29/23 -sending fluid for studies, evaluation for SBP -appreciate GI service consultation and recommendations -hopefully can start furosemide/spironolactone when electrolytes are improved  -KUB suggesting large amt of bowel gas -GI service ordered liver doppler studies for today, results pending   Hypomagnesemia - Repleted with IV magnesium  Barrett's esophagus without dysplasia - continue PPI  Alcohol Abuser/History of DTs -Monitoring closely, following CIWA protocol -continue daily thiamine, folate replacement   Opioid  dependence Chronic Pain  Opioid induced chronic constipation  -Pt reports being on opioids since 1990s and has withdrawal symptoms if not receiving -we did resume his home oxycodone 15 mg Q6h prn -IV opioids being reduced as much as able  -laxatives being managed by GI, small BM yesterday, still having some flatus  Elevated LFTs - Avoid nephrotoxin, including statins -Monitoring LFTs -treating alcohol use and abuse    Latest Ref Rng & Units 04/28/2023    3:46 AM 09/21/2022   12:25 AM 09/20/2022   12:30 AM  Hepatic Function  Total Protein 6.5 - 8.1 g/dL 7.3  7.5  7.0   Albumin 3.5 - 5.0 g/dL 2.6  2.7  2.3   AST 15 - 41 U/L 174  91  60   ALT 0 - 44 U/L 42  34  23   Alk Phosphatase 38 - 126 U/L 217  187  174   Total Bilirubin 0.3 - 1.2 mg/dL 3.6  1.3  1.2    Dyslipidemia - Holding statins due to elevated LFTs due to alcohol use abuse liver cirrhosis  Acute HFmrEF -Monitor I's and O's -Limit IV fluid resuscitation -Last echo 12/19/2020:  LVEF 45-50%, mildly decreased LV function moderate LVH, grade 1 diastolic dysfunction impaired relaxation  Intake/Output Summary (Last 24 hours) at 04/30/2023 1410 Last data filed at 04/30/2023 1300 Gross per 24 hour  Intake 1040 ml  Output 2600 ml  Net -1560 ml   Filed Weights   04/28/23 0313 04/29/23 0501 04/30/23 0417  Weight: 111.1 kg 117.7 kg 118.4 kg   GERD (gastroesophageal reflux disease) Continue PPI  History of Wolff-Parkinson-White syndrome Status post ablation, monitoring  Hypokalemia - additional IV Mg and K given with oral K  - recheck in AM  Essential hypertension - BPs are soft at this time, following closely   DVT prophylaxis: SCDs Code Status: Full  Family Communication:  Disposition: anticipate home when medically stabilized     Consultants:  GI  Procedures:  Paracentesis planned for 8/14 but not enough fluid present per radiologist  Antimicrobials:    Subjective: Had a very small BM, abdomen remains  bloated and reports has been on opioids since the 1990s and withdraws if not receiving them.     Objective: Vitals:   04/30/23 1100 04/30/23 1156 04/30/23 1200 04/30/23 1300  BP: 134/76  (!) 142/104 116/84  Pulse: 92  93 86  Resp: 15  14 19   Temp:  97.8 F (36.6 C)    TempSrc:  Oral    SpO2: 93%  97% 97%  Weight:      Height:        Intake/Output Summary (Last 24 hours) at 04/30/2023 1410 Last data filed at 04/30/2023 1300 Gross per 24 hour  Intake 1040 ml  Output 2600 ml  Net -1560 ml   Filed Weights   04/28/23 0313 04/29/23 0501 04/30/23 0417  Weight: 111.1 kg 117.7 kg 118.4 kg   Examination:  General exam: Appears chronically ill, jaundiced, calm and comfortable  Respiratory system: Clear to auscultation. Respiratory effort normal. Cardiovascular system: normal S1 & S2 heard. No JVD, murmurs, rubs, gallops or clicks. No pedal edema. Gastrointestinal system: Abdomen is moderately to severely distended, reducible umbilical hernia seen; No masses felt. Normal bowel sounds heard. Central nervous system: Alert and oriented. No focal neurological deficits. Extremities: Symmetric 5 x 5 power. Skin: No rashes, lesions or ulcers. Psychiatry: Judgement and insight UTD. Mood & affect appropriate.   Data Reviewed: I have personally reviewed following labs and imaging studies  CBC: Recent Labs  Lab 04/28/23 0346 04/29/23 0507 04/30/23 0522  WBC 9.2 8.0 7.6  NEUTROABS 6.7  --   --   HGB 11.7* 10.7* 11.0*  HCT 33.8* 31.4* 32.5*  MCV 92.1 94.9 96.7  PLT 126* 105* 107*    Basic Metabolic Panel: Recent Labs  Lab 04/28/23 0346 04/28/23 0945 04/29/23 0507 04/30/23 0522  NA 132* 131* 128* 129*  K 2.4* 2.6* 2.5* 2.9*  CL 87* 90* 91* 91*  CO2 31 29 30 29   GLUCOSE 110* 93 69* 80  BUN 7 7 10 13   CREATININE 1.19 1.09 1.10 1.22  CALCIUM 8.2* 7.8* 8.1* 8.1*  MG 1.6*  --  1.8 2.0    CBG: Recent Labs  Lab 04/28/23 0809 04/29/23 0724 04/30/23 0729  GLUCAP 98 89 74     Recent Results (from the past 240 hour(s))  MRSA Next Gen by PCR, Nasal     Status: None   Collection Time: 04/29/23 12:25 AM   Specimen: Nasal Mucosa; Nasal Swab  Result Value Ref Range Status   MRSA by PCR Next Gen NOT DETECTED NOT DETECTED Final    Comment: (NOTE) The GeneXpert MRSA Assay (FDA approved for NASAL specimens only), is one component of a comprehensive MRSA colonization surveillance program. It is not intended to diagnose MRSA infection nor to guide or monitor treatment for MRSA infections. Test performance is not FDA approved in patients less than 68 years old. Performed at [facility], [address], [place], [place] [address]      Radiology Studies: DG Abd 1 View  Result Date: 04/29/2023 CLINICAL DATA:  Abdominal distension. EXAM: ABDOMEN - 1 VIEW COMPARISON:  CT abdomen/pelvis 04/27/2021 FINDINGS: Mildly prominent gaseous distension of  colon with nonobstructive bowel gas pattern. No abnormal calcifications. Degenerative changes of the spine. IMPRESSION: Nonobstructive bowel gas pattern. Mildly prominent gaseous distension of colon. Electronically Signed   By: Feliberto Harts M.D.   On: 04/29/2023 16:58   [place] ASCITES (ABDOMEN LIMITED)  Result Date: 04/29/2023 CLINICAL DATA:  Ascites Interventional radiology consulted for paracentesis EXAM: LIMITED ABDOMEN ULTRASOUND FOR ASCITES TECHNIQUE: Limited ultrasound survey for ascites was performed in all four abdominal quadrants. COMPARISON:  CT abdomen pelvis 04/28/2023 FINDINGS: Sonographic evaluation of the abdomen demonstrates trace perihepatic ascites which is insufficient for aspiration. IMPRESSION: Trace perihepatic ascites, insufficient for aspiration. No paracentesis was performed. Electronically Signed   By: Acquanetta Belling M.D.   On: 04/29/2023 09:56    Scheduled Meds:  carvedilol  6.25 mg Oral BID WC   Chlorhexidine Gluconate Cloth  6 each Topical Daily   enoxaparin (LOVENOX) injection  55 mg Subcutaneous Q24H    folic acid  1 mg Oral Daily   multivitamin with minerals  1 tablet Oral Daily   nicotine  21 mg Transdermal Daily   pantoprazole (PROTONIX) IV  40 mg Intravenous Q12H   polyethylene glycol  17 g Oral Daily   sodium chloride flush  3 mL Intravenous Q12H   sucralfate  1 g Oral BID   thiamine  100 mg Oral Daily   Or   thiamine  100 mg Intravenous Daily   Continuous Infusions:   LOS: 2 days   Time spent: 44 mins   Laural Benes, MD How to contact the [facility] Attending or Consulting provider 7A - 7P or covering provider during after hours 7P -7A, for this patient?  Check the care team in [facility] and look for a) attending/consulting [facility] provider listed and b) the [facility] team listed Log into [email or web address] and use [place]'s universal password to access. If you do not have the password, please contact the hospital operator. Locate the [facility] provider you are looking for under [facility] and page to a number that you can be directly reached. If you still have difficulty reaching the provider, please page the [facility] (Director on Call) for the Hospitalists listed on amion for assistance.  04/30/2023, 2:10 PM

## 2023-04-30 NOTE — Plan of Care (Signed)
Patent still having a lot of abdominal pain today.  Ambulated in room today two times.

## 2023-04-30 NOTE — Progress Notes (Signed)
Occupational Therapy Treatment Patient Details Name: Timothy [name] MRN: [id number] DOB: Oct 29, 1970 Today's Date: 04/30/2023   History of present illness Timothy [name] is a 52 year old male with history of HTN,  Barrett esophagus, PUD, WPW status post ablation, NICM, alcohol abuse, and liver disease who presents with worsening abdominal distention and pain.  For past 2-3 days.  Reporting his bellybutton is popping out now.  Voiding and nonbilious nonbloody emesis difficulty having bowel movements.   OT comments  Pt agreeable to OT treatment. Pt rearrested to use the bathroom and required min A for bed mobility and min A for ambulatory transfer to the toilet. Pt was unable to void prior to the transport team arriving to take the pt to the procedure. Pt was able to ambulate from the toilet to the transport chair with min A but much extended time. Overall pt demonstrated very slow and labored movement today with reports of abdominal pain. Session ended with pt being taken by the transport team. Pt will benefit from continued OT in the hospital and recommended venue below to increase strength, balance, and endurance for safe ADL's.         If plan is discharge home, recommend the following:  A lot of help with walking and/or transfers;A lot of help with bathing/dressing/bathroom;Assistance with cooking/housework;Assist for transportation;Help with stairs or ramp for entrance   Equipment Recommendations  None recommended by OT          Precautions / Restrictions Precautions Precautions: Fall Restrictions Weight Bearing Restrictions: No       Mobility Bed Mobility Overal bed mobility: Needs Assistance Bed Mobility: Supine to Sit     Supine to sit: Min assist     General bed mobility comments: increased time and labored movement; hand held assist    Transfers Overall transfer level: Needs assistance Equipment used: Rolling walker (2 wheels) Transfers: Sit to/from Stand, Bed to  chair/wheelchair/BSC Sit to Stand: Min assist     Step pivot transfers: Min assist     General transfer comment: Unsteady in standing; extended time and labored effort.     Balance Overall balance assessment: Needs assistance Sitting-balance support: Feet supported, Bilateral upper extremity supported Sitting balance-Leahy Scale: Fair Sitting balance - Comments: seated at EOB   Standing balance support: Bilateral upper extremity supported, During functional activity, Reliant on assistive device for balance Standing balance-Leahy Scale: Fair Standing balance comment: using RW                           ADL either performed or assessed with clinical judgement   ADL Overall ADL's : Needs assistance/impaired                         Toilet Transfer: Minimal assistance;Rolling walker (2 wheels);Ambulation Toilet Transfer Details (indicate cue type and reason): EOB to toilet with extended time and labored movement.         Functional mobility during ADLs: Minimal assistance;Rolling walker (2 wheels) General ADL Comments: Pt able to ambulate slowly from bed to toilet and toilet to transport chair at the other end of the room. ~10 to 15 feet of ambluation total. Very labored with RW use.      Cognition Arousal: Alert Behavior During Therapy: WFL for tasks assessed/performed, Anxious Overall Cognitive Status: Within Functional Limits for tasks assessed  Pertinent Vitals/ Pain       Pain Assessment Pain Assessment: Faces Faces Pain Scale: Hurts little more Pain Location: abdominal area Pain Descriptors / Indicators: Grimacing, Guarding Pain Intervention(s): Limited activity within patient's tolerance, Monitored during session, Repositioned                                                          Frequency  Min 2X/week        Progress Toward  Goals  OT Goals(current goals can now be found in the care plan section)  Progress towards OT goals: Progressing toward goals  Acute Rehab OT Goals Patient Stated Goal: go to rehab OT Goal Formulation: With patient Time For Goal Achievement: 05/12/23 Potential to Achieve Goals: Fair ADL Goals Pt Will Perform Grooming: with contact guard assist;standing Pt Will Perform Upper Body Bathing: with modified independence;sitting Pt Will Perform Lower Body Bathing: with min assist;sitting/lateral leans Pt Will Perform Upper Body Dressing: with modified independence;sitting Pt Will Perform Lower Body Dressing: with min assist;sitting/lateral leans Pt Will Transfer to Toilet: with contact guard assist;ambulating Pt Will Perform Toileting - Clothing Manipulation and hygiene: with modified independence;sitting/lateral leans Pt/caregiver will Perform Home Exercise Program: Increased ROM;Increased strength;Right Upper extremity;Left upper extremity;Independently  Plan                                      End of Session    OT Visit Diagnosis: Unsteadiness on feet (R26.81);Other abnormalities of gait and mobility (R26.89);Muscle weakness (generalized) (M62.81)     Time: 4098-1191 OT Time Calculation (min): 24 min  Charges: OT General Charges $OT Visit: 1 Visit OT Treatments $Self Care/Home Management : 8-22 mins $Therapeutic Exercise: 8-22 mins    OT, MOT   Danie Chandler 04/30/2023, 9:43 AM

## 2023-04-30 NOTE — Progress Notes (Signed)
Gastroenterology Progress Note   Referring Provider: No ref. provider found Primary Care Physician:  Elfredia Nevins, MD  Patient ID: Timothy [name]; [id number]; November 13, 1970   Subjective:    Patient sleeping. Easily arouses but drowsy. Generalized abdominal discomfort. Tolerating diet. Small BM yesterday. No n/v.  Objective:   Vital signs in last 24 hours: Temp:  [97.5 F (36.4 C)-99.2 F (37.3 C)] 98.6 F (37 C) (08/15 1559) Pulse Rate:  [80-97] 86 (08/15 1300) Resp:  [12-24] 19 (08/15 1300) BP: (102-158)/(37-105) 116/84 (08/15 1300) SpO2:  [90 %-99 %] 97 % (08/15 1300) Weight:  [118.4 kg] 118.4 kg (08/15 0417) Last BM Date : 04/29/23 General:   Drowsy pleasant and cooperative in NAD Head:  Normocephalic and atraumatic. Eyes:  Sclera clear, no icterus.   Abdomen:  Soft, distended. Erythema/bruising noted right upper quadrant/flank. Mild diffuse tenderness. Pitting edema in dependent areas/flank. Normal bowel sounds, without guarding, and without rebound.   Extremities:  Without clubbing, deformity. 2+ pitting bilateral lower extremity edema. Neurologic:  Alert and  oriented x4;  grossly normal neurologically. Skin:  Intact without significant lesions or rashes. Psych:  Alert and cooperative. Normal mood and affect.  Intake/Output from previous day: 08/14 0701 - 08/15 0700 In: 1071.2 [P.O.:680; I.V.:3; IV Piggyback:388.2] Out: 2650 [Urine:2650] Intake/Output this shift: Total I/O In: 360 [P.O.:360] Out: 400 [Urine:400]  Lab Results: CBC Recent Labs    04/28/23 0346 04/29/23 0507 04/30/23 0522  WBC 9.2 8.0 7.6  HGB 11.7* 10.7* 11.0*  HCT 33.8* 31.4* 32.5*  MCV 92.1 94.9 96.7  PLT 126* 105* 107*   BMET Recent Labs    04/28/23 0945 04/29/23 0507 04/30/23 0522  NA 131* 128* 129*  K 2.6* 2.5* 2.9*  CL 90* 91* 91*  CO2 29 30 29   GLUCOSE 93 69* 80  BUN 7 10 13   CREATININE 1.09 1.10 1.22  CALCIUM 7.8* 8.1* 8.1*   LFTs Recent Labs    04/28/23 0945  04/29/23 0507 04/30/23 0522  BILITOT 3.9* 5.7* 5.9*  ALKPHOS 220* 204* 207*  AST 157* 142* 152*  ALT 39 34 36  PROT 6.8 6.6 6.8  ALBUMIN 2.4* 2.3* 2.4*   Recent Labs    04/28/23 0346  LIPASE 53*   PT/INR Recent Labs    04/29/23 0507 04/30/23 0522  LABPROT 20.2* 18.4*  INR 1.7* 1.5*         Imaging Studies: US ABDOMEN LIMITED WITH LIVER DOPPLER  Result Date: 04/30/2023 CLINICAL DATA:  52 year old male with history of cirrhosis. EXAM: DUPLEX ULTRASOUND OF LIVER TECHNIQUE: Color and duplex Doppler ultrasound was performed to evaluate the hepatic in-flow and out-flow vessels. COMPARISON:  CT abdomen pelvis from 04/28/2023, 02/01/2013 FINDINGS: Liver: Diffusely increased echogenicity. Glisson's capsule is invisible amongst the portal veins. Mildly nodular contour. No focal lesion, mass or intrahepatic biliary ductal dilatation. Main Portal Vein size: 0.7 cm Portal Vein Velocities Main Prox:  51 cm/sec, hepatofugal Main Mid: 36 cm/sec, hepatofugal Main Dist:  38 cm/sec, hepatofugal Right: Not visualized. Left: 24 cm/sec, hepatofugal Hepatic Vein Velocities Right:  53 cm/sec, antegrade Middle:  36 cm/sec, antegrade Left:  32 cm/sec, antegrade IVC: Not visualized. Hepatic Artery Velocity:  173 cm/sec Splenic Vein Velocity:  28 cm/sec Spleen: 12.7 cm x 12.8 cm x 6.2 cm with a total volume of 529 cm^3 (411 cm^3 is upper limit normal) Portal Vein Occlusion/Thrombus: No Splenic Vein Occlusion/Thrombus: No Ascites: Trace Varices: None IMPRESSION: 1. Diffuse severe echogenicity throughout the hepatic parenchyma as could be seen with  severe hepatic steatosis or in the acute setting of hepatitis. 2. Morphologic changes of cirrhosis with portal hypertension is evidence by mild splenomegaly, trace ascites, and hepatofugal flow the visualized portal system which is widely patent. Marliss Coots, MD [facility] Radiology Electronically Signed   By: Marliss Coots M.D.   On: 04/30/2023 15:35   DG Abd 1 View  Result Date: 04/29/2023 CLINICAL DATA:  Abdominal distension. EXAM: ABDOMEN - 1 VIEW COMPARISON:  CT abdomen/pelvis 04/27/2021 FINDINGS: Mildly prominent gaseous distension of colon with nonobstructive bowel gas pattern. No abnormal calcifications. Degenerative changes of the spine. IMPRESSION: Nonobstructive bowel gas pattern. Mildly prominent gaseous distension of colon. Electronically Signed   By: Feliberto Harts M.D.   On: 04/29/2023 16:58   [place] ASCITES (ABDOMEN LIMITED)  Result Date: 04/29/2023 CLINICAL DATA:  Ascites Interventional radiology consulted for paracentesis EXAM: LIMITED ABDOMEN ULTRASOUND FOR ASCITES TECHNIQUE: Limited ultrasound survey for ascites was performed in all four abdominal quadrants. COMPARISON:  CT abdomen pelvis 04/28/2023 FINDINGS: Sonographic evaluation of the abdomen demonstrates trace perihepatic ascites which is insufficient for aspiration. IMPRESSION: Trace perihepatic ascites, insufficient for aspiration. No paracentesis was performed. Electronically Signed   By: Acquanetta Belling M.D.   On: 04/29/2023 09:56   CT ABDOMEN PELVIS W CONTRAST  Result Date: 04/28/2023 CLINICAL DATA:  Bowel obstruction suspected. Acute, nonlocalized abdominal pain. EXAM: CT ABDOMEN AND PELVIS WITH CONTRAST TECHNIQUE: Multidetector CT imaging of the abdomen and pelvis was performed using the standard protocol following bolus administration of intravenous contrast. RADIATION DOSE REDUCTION: This exam was performed according to the departmental dose-optimization program which includes automated exposure control, adjustment of the mA and/or kV according to patient size and/or use of iterative reconstruction technique. CONTRAST:  OMNIPAQUE IOHEXOL 300 MG/ML  SOLN COMPARISON:  09/24/2021 FINDINGS: Lower chest:  Coronary atherosclerosis. Hepatobiliary: Severe hepatic steatosis. Lobulated liver surface with newly enlarged caudate and further  widened fissures.No evidence of biliary obstruction or stone. Pancreas: Unremarkable. Spleen: Unremarkable. Adrenals/Urinary Tract: Negative adrenals. No hydronephrosis or stone. Left renal hilar and cortical cysts with simple appearance measuring up to 3.2 cm at the renal hilum. Unremarkable bladder. Stomach/Bowel: No obstruction. No appendicitis other visible bowel inflammation. Vascular/Lymphatic: No acute vascular abnormality. Scattered atheromatous calcification. No mass or adenopathy. Reproductive:No pathologic findings. Other: New small volume ascites distributed throughout the abdomen including at the small umbilical hernia. Musculoskeletal: No acute abnormalities. Generalized degeneration with mild scoliosis. IMPRESSION: 1. Severe hepatic steatosis with progressive findings of cirrhosis since 2023. Small volume ascites is newly seen. 2. Extensive atherosclerosis. Electronically Signed   By: Tiburcio Pea M.D.   On: 04/28/2023 05:30  [2 weeks]  Assessment:   Timothy [name] is a 52 year old male with history of CAD, WPW s/p ablation, chronic pain, diastolic heart failure, GERD, HTN, adenomatous colon polyp, gastric ulcers, ulcerative esophagitis, alcohol abuse, who presented to the ED yesterday with c/o abdominal pain/distention, vomiting, difficulty with BMs.  Found to have mild anemia with hemoglobin of 11.7, hyponatremia, hypokalemia, elevated LFTs and bilirubin, EtOH 245.  CT A/P with contrast with severe hepatic steatosis with progressive findings of cirrhosis, small volume ascites.  consulted for abdominal pain and cirrhosis.   Generalized abdominal pain/distention/constipation: initially thought secondary to constipation and ascites. Paracentesis to be performed today for therapeutic and diagnostic purposes though [place] without enough fluid to be drawn off. His abdomen was quite distended and he is not passing flatus or stool despite Miralax 17g being initiated so KUB obtained  which ruled out  obstruction but mildly prominent gaseous distention of colon noted.   Noted large area of bruising on the left upper quadrant/left flank with nothing on CT to explain.    Cirrhosis: findings concerning for Cirrhosis on CT this admission, likely secondary to ETOH abuse, iron studies this admission with normal ferritin. reportedly drinking one 22 oz beer/day, down from 3-4 daily. He is decompensated with ascites, LEE, thrombocytopenia. MELD 3.0 today is 25.    On lasix 40mg  daily as outpatient. Diuretics on hold due to hypokalemia/hyponatremia, will need to be resumed with addition of spironoalctone once electrolytes corrected.    He is drowsy but a/ox4 today, no obvious asterixis on exam.    Was On carvedilol 3.125mg  BID as outpatient, though increased by hospitalist to 6.25mg  BID here. BP stable at 130/76, HR 91. Of note, no EVs on recent EGD in January 2024.    No focal liver lesions on CT this admission. AFP tumor marker is pending.  Maddrey DF 30.7 with PT control of 13  Liver doppler: severe hepatic steatosis, changes of cirrhosis with portal HTN, no thrombus.    Anemia/melena/hematochezia: hgb 11.7 on admission, 11.0 this morning. Iron studies with normal ferritin of 149, iron 162, TIBC 194, saturation 84. Folate 3.6, B 12 999. reported an episode of melena as well as rectal bleeding 3 to 4 days ago with no recurrent symptoms.    -Last EGD January 2024 with ulcerative esophagitis with ulceration, no esophageal or gastric varices. -Last TCS in 2014 with TAs, father with history of CRC in his 58s. currently overdue for surveillance.    Will continue with PPI BID, can plan for repeat Colonoscopy +/- EGD as outpatient unless further decline in hgb or persistent overt GI bleeding.    Nausea/vomiting: chronic, since January 2024, EGD with findings as outlined above, likely secondary to uncontrolled reflux/possible gastritis and in setting of ongoing ETOH use and NSAIDs. Not on PPI as  outpatient.     Electrolyte imbalance: K+ 2.4 on admission, sodium 132 on admission, sodium 129, and K 2.9 this morning, likely multifactorial. Diuretics on hold until electrolytes corrected, supplementation ordered per hospitalist.      Plan:   CMET, CBC, PT/INR in AM. Resume diuretics when able, add spironolactone 100mg  daily/continue lasix 40mg  daily. Continue carvedilol.  PPI BID.  2g sodium diet.  Etoh cessation. More aggressive bowel regimen given no signs of obstruction and narcotic use. Will start Linzess daily. Can titrate accordingly.  Reassess in AM.   LOS: 2 days   Leanna Battles. [facility] [phone] 8/15/20244:11 PM

## 2023-05-01 ENCOUNTER — Inpatient Hospital Stay (HOSPITAL_COMMUNITY): Payer: 59

## 2023-05-01 DIAGNOSIS — F411 Generalized anxiety disorder: Secondary | ICD-10-CM

## 2023-05-01 DIAGNOSIS — E876 Hypokalemia: Secondary | ICD-10-CM | POA: Diagnosis not present

## 2023-05-01 DIAGNOSIS — I509 Heart failure, unspecified: Secondary | ICD-10-CM

## 2023-05-01 DIAGNOSIS — K2270 Barrett's esophagus without dysplasia: Secondary | ICD-10-CM

## 2023-05-01 DIAGNOSIS — K7031 Alcoholic cirrhosis of liver with ascites: Secondary | ICD-10-CM | POA: Diagnosis not present

## 2023-05-01 DIAGNOSIS — F10930 Alcohol use, unspecified with withdrawal, uncomplicated: Secondary | ICD-10-CM

## 2023-05-01 DIAGNOSIS — Z8679 Personal history of other diseases of the circulatory system: Secondary | ICD-10-CM

## 2023-05-01 DIAGNOSIS — R1084 Generalized abdominal pain: Secondary | ICD-10-CM | POA: Diagnosis not present

## 2023-05-01 LAB — COMPREHENSIVE METABOLIC PANEL
ALT: 34 U/L (ref 0–44)
AST: 147 U/L — ABNORMAL HIGH (ref 15–41)
Albumin: 2.3 g/dL — ABNORMAL LOW (ref 3.5–5.0)
Alkaline Phosphatase: 191 U/L — ABNORMAL HIGH (ref 38–126)
Anion gap: 7 (ref 5–15)
BUN: 13 mg/dL (ref 6–20)
CO2: 29 mmol/L (ref 22–32)
Calcium: 8.2 mg/dL — ABNORMAL LOW (ref 8.9–10.3)
Chloride: 92 mmol/L — ABNORMAL LOW (ref 98–111)
Creatinine, Ser: 1.25 mg/dL — ABNORMAL HIGH (ref 0.61–1.24)
GFR, Estimated: >60 mL/min (ref >60)
Glucose, Bld: 85 mg/dL (ref 70–99)
Potassium: 2.9 mmol/L — ABNORMAL LOW (ref 3.5–5.1)
Sodium: 128 mmol/L — ABNORMAL LOW (ref 135–145)
Total Bilirubin: 5.6 mg/dL — ABNORMAL HIGH (ref 0.3–1.2)
Total Protein: 6.5 g/dL (ref 6.5–8.1)

## 2023-05-01 LAB — CBC
HCT: 30.5 % — ABNORMAL LOW (ref 39.0–52.0)
HCT: 30.1 % — ABNORMAL LOW (ref 39.0–52.0)
Hemoglobin: 10.2 g/dL — ABNORMAL LOW (ref 13.0–17.0)
Hemoglobin: 10.1 g/dL — ABNORMAL LOW (ref 13.0–17.0)
MCH: 32.8 pg (ref 26.0–34.0)
MCH: 32.8 pg (ref 26.0–34.0)
MCHC: 33.4 g/dL (ref 30.0–36.0)
MCHC: 33.6 g/dL (ref 30.0–36.0)
MCV: 98.1 fL (ref 80.0–100.0)
MCV: 97.7 fL (ref 80.0–100.0)
Platelets: 120 10*3/uL — ABNORMAL LOW (ref 150–400)
Platelets: 124 10*3/uL — ABNORMAL LOW (ref 150–400)
RBC: 3.11 MIL/uL — ABNORMAL LOW (ref 4.22–5.81)
RBC: 3.08 MIL/uL — ABNORMAL LOW (ref 4.22–5.81)
RDW: 19.4 % — ABNORMAL HIGH (ref 11.5–15.5)
RDW: 19.4 % — ABNORMAL HIGH (ref 11.5–15.5)
WBC: 7.5 10*3/uL (ref 4.0–10.5)
WBC: 8.2 10*3/uL (ref 4.0–10.5)
nRBC: 0.3 % — ABNORMAL HIGH (ref 0.0–0.2)
nRBC: 0 % (ref 0.0–0.2)

## 2023-05-01 LAB — GLUCOSE, CAPILLARY
Glucose-Capillary: 105 mg/dL — ABNORMAL HIGH (ref 70–99)
Glucose-Capillary: 97 mg/dL (ref 70–99)

## 2023-05-01 LAB — TYPE AND SCREEN
ABO/RH(D): A POS
Antibody Screen: NEGATIVE

## 2023-05-01 LAB — PROTIME-INR
INR: 1.5 — ABNORMAL HIGH (ref 0.8–1.2)
Prothrombin Time: 17.9 seconds — ABNORMAL HIGH (ref 11.4–15.2)

## 2023-05-01 MED ORDER — LACTULOSE 10 GM/15ML PO SOLN
10.0000 g | Freq: Two times a day (BID) | ORAL | Status: DC
  Administered 2023-05-01 – 2023-05-02 (×3): 10 g via ORAL
  Filled 2023-05-01 (×3): qty 30

## 2023-05-01 MED ORDER — POLYETHYLENE GLYCOL 3350 17 G PO PACK: 17.0000 g | PACK | Freq: Every day | ORAL | Status: DC | PRN

## 2023-05-01 MED ORDER — POTASSIUM CHLORIDE 10 MEQ/100ML IV SOLN
10.0000 meq | INTRAVENOUS | Status: AC
  Administered 2023-05-01 (×4): 10 meq via INTRAVENOUS
  Filled 2023-05-01 (×4): qty 100

## 2023-05-01 NOTE — Care Management Important Message (Signed)
Important Message  Patient Details  Name: Timothy [name] MRN: [id number] Date of Birth: May 05, 1971   Medicare Important Message Given:  Yes (copy left in room)     Corey Harold 05/01/2023, 3:54 PM

## 2023-05-01 NOTE — Plan of Care (Signed)
  Problem: Education: Goal: Knowledge of General Education information will improve Description: Including pain rating scale, medication(s)/side effects and non-pharmacologic comfort measures Outcome: Not Progressing   Problem: Health Behavior/Discharge Planning: Goal: Ability to manage health-related needs will improve Outcome: Not Progressing   Problem: Clinical Measurements: Goal: Will remain free from infection Outcome: Progressing   Problem: Activity: Goal: Risk for activity intolerance will decrease Outcome: Not Progressing

## 2023-05-01 NOTE — Progress Notes (Signed)
Notified Dr. Camillo Flaming of K 2.9 and does not appear that it was replaced today. Awaiting response.

## 2023-05-01 NOTE — Progress Notes (Cosign Needed Addendum)
Gastroenterology Progress Note   Referring Provider: No ref. provider found Primary Care Physician:  Elfredia Nevins, MD Primary Gastroenterologist:  Dr. Jena Gauss  Patient ID: Timothy [name]; [id number]; 01-27-1971    Subjective   Patient sitting up in the chair finishing up his bath this morning.  He continues to experience diffuse abdominal pain, somewhat greater in the right upper quadrant.  He continues to report fatigue but denies any shortness of breath or chest pain.  Has had some mild flatus but no bowel movement.  Has been able to tolerate his diet.  Nursing reported some darker urine overnight.  Objective   Vital signs in last 24 hours Temp:  [97.6 F (36.4 C)-98.6 F (37 C)] 97.6 F (36.4 C) (08/16 0400) Pulse Rate:  [74-93] 87 (08/16 0800) Resp:  [12-24] 15 (08/16 0600) BP: (93-148)/(51-111) 145/83 (08/16 0800) SpO2:  [89 %-99 %] 97 % (08/16 0800) Weight:  [118.6 kg] 118.6 kg (08/16 0500) Last BM Date : 04/30/23  Physical Exam General: Drowsy.  Sitting up in chair in NAD.  Pleasant and cooperative Head:  Normocephalic and atraumatic. Eyes:  No icterus, sclera clear. Conjuctiva pink.  Heart:  S1, S2 present, no murmurs noted.  Lungs: Clear to auscultation bilaterally, without wheezing, rales, or rhonchi.  Abdomen:  Bowel sounds present, soft.  Mild distention.  Mild diffuse tenderness.  No pitting edema to flank.  No HSM or hernias noted. No rebound or guarding. No masses appreciated.  Extremities:  + 3 edema bilaterally Neurologic: Drowsy but  oriented x3; disoriented to time.  No asterixis present. Skin:  Warm and dry, intact.  Bruising noted to LUQ/flank.  Psych:  Alert and cooperative. Normal mood and affect.  Intake/Output from previous day: 08/15 0701 - 08/16 0700 In: 1080 [P.O.:1080] Out: 1100 [Urine:1100] Intake/Output this shift: No intake/output data recorded.  Lab Results  Recent Labs    04/29/23 0507 04/30/23 0522 05/01/23 0446  WBC 8.0  7.6 7.5  HGB 10.7* 11.0* 10.2*  HCT 31.4* 32.5* 30.5*  PLT 105* 107* 120*   BMET Recent Labs    04/29/23 0507 04/30/23 0522 05/01/23 0446  NA 128* 129* 128*  K 2.5* 2.9* 2.9*  CL 91* 91* 92*  CO2 30 29 29   GLUCOSE 69* 80 85  BUN 10 13 13   CREATININE 1.10 1.22 1.25*  CALCIUM 8.1* 8.1* 8.2*   LFT Recent Labs    04/29/23 0507 04/30/23 0522 05/01/23 0446  PROT 6.6 6.8 6.5  ALBUMIN 2.3* 2.4* 2.3*  AST 142* 152* 147*  ALT 34 36 34  ALKPHOS 204* 207* 191*  BILITOT 5.7* 5.9* 5.6*   PT/INR Recent Labs    04/30/23 0522 05/01/23 0446  LABPROT 18.4* 17.9*  INR 1.5* 1.5*   Hepatitis Panel No results for input(s): "HEPBSAG", "HCVAB", "HEPAIGM", "HEPBIGM" in the last 72 hours.  Studies/Results US ABDOMEN LIMITED WITH LIVER DOPPLER  Result Date: 04/30/2023 CLINICAL DATA:  52 year old male with history of cirrhosis. EXAM: DUPLEX ULTRASOUND OF LIVER TECHNIQUE: Color and duplex Doppler ultrasound was performed to evaluate the hepatic in-flow and out-flow vessels. COMPARISON:  CT abdomen pelvis from 04/28/2023, 02/01/2013 FINDINGS: Liver: Diffusely increased echogenicity. Glisson's capsule is invisible amongst the portal veins. Mildly nodular contour. No focal lesion, mass or intrahepatic biliary ductal dilatation. Main Portal Vein size: 0.7 cm Portal Vein Velocities Main Prox:  51 cm/sec, hepatofugal Main Mid: 36 cm/sec, hepatofugal Main Dist:  38 cm/sec, hepatofugal Right: Not visualized. Left: 24 cm/sec, hepatofugal Hepatic Vein Velocities Right:  53 cm/sec, antegrade Middle:  36 cm/sec, antegrade Left:  32 cm/sec, antegrade IVC: Not visualized. Hepatic Artery Velocity:  173 cm/sec Splenic Vein Velocity:  28 cm/sec Spleen: 12.7 cm x 12.8 cm x 6.2 cm with a total volume of 529 cm^3 (411 cm^3 is upper limit normal) Portal Vein Occlusion/Thrombus: No Splenic Vein Occlusion/Thrombus: No Ascites: Trace Varices: None IMPRESSION: 1. Diffuse severe echogenicity throughout the hepatic parenchyma  as could be seen with severe hepatic steatosis or in the acute setting of hepatitis. 2. Morphologic changes of cirrhosis with portal hypertension is evidence by mild splenomegaly, trace ascites, and hepatofugal flow the visualized portal system which is widely patent. Marliss Coots, MD [facility] Radiology Electronically Signed   By: Marliss Coots M.D.   On: 04/30/2023 15:35   DG Abd 1 View  Result Date: 04/29/2023 CLINICAL DATA:  Abdominal distension. EXAM: ABDOMEN - 1 VIEW COMPARISON:  CT abdomen/pelvis 04/27/2021 FINDINGS: Mildly prominent gaseous distension of colon with nonobstructive bowel gas pattern. No abnormal calcifications. Degenerative changes of the spine. IMPRESSION: Nonobstructive bowel gas pattern. Mildly prominent gaseous distension of colon. Electronically Signed   By: Feliberto Harts M.D.   On: 04/29/2023 16:58   [place] ASCITES (ABDOMEN LIMITED)  Result Date: 04/29/2023 CLINICAL DATA:  Ascites Interventional radiology consulted for paracentesis EXAM: LIMITED ABDOMEN ULTRASOUND FOR ASCITES TECHNIQUE: Limited ultrasound survey for ascites was performed in all four abdominal quadrants. COMPARISON:  CT abdomen pelvis 04/28/2023 FINDINGS: Sonographic evaluation of the abdomen demonstrates trace perihepatic ascites which is insufficient for aspiration. IMPRESSION: Trace perihepatic ascites, insufficient for aspiration. No paracentesis was performed. Electronically Signed   By: Acquanetta Belling M.D.   On: 04/29/2023 09:56   CT ABDOMEN PELVIS W CONTRAST  Result Date: 04/28/2023 CLINICAL DATA:  Bowel obstruction suspected. Acute, nonlocalized abdominal pain. EXAM: CT ABDOMEN AND PELVIS WITH CONTRAST TECHNIQUE: Multidetector CT imaging of the abdomen and pelvis was performed using the standard protocol following bolus administration of intravenous contrast. RADIATION DOSE REDUCTION: This exam was performed according to the departmental  dose-optimization program which includes automated exposure control, adjustment of the mA and/or kV according to patient size and/or use of iterative reconstruction technique. CONTRAST:  OMNIPAQUE IOHEXOL 300 MG/ML  SOLN COMPARISON:  09/24/2021 FINDINGS: Lower chest:  Coronary atherosclerosis. Hepatobiliary: Severe hepatic steatosis. Lobulated liver surface with newly enlarged caudate and further widened fissures.No evidence of biliary obstruction or stone. Pancreas: Unremarkable. Spleen: Unremarkable. Adrenals/Urinary Tract: Negative adrenals. No hydronephrosis or stone. Left renal hilar and cortical cysts with simple appearance measuring up to 3.2 cm at the renal hilum. Unremarkable bladder. Stomach/Bowel: No obstruction. No appendicitis other visible bowel inflammation. Vascular/Lymphatic: No acute vascular abnormality. Scattered atheromatous calcification. No mass or adenopathy. Reproductive:No pathologic findings. Other: New small volume ascites distributed throughout the abdomen including at the small umbilical hernia. Musculoskeletal: No acute abnormalities. Generalized degeneration with mild scoliosis. IMPRESSION: 1. Severe hepatic steatosis with progressive findings of cirrhosis since 2023. Small volume ascites is newly seen. 2. Extensive atherosclerosis. Electronically Signed   By: Tiburcio Pea M.D.   On: 04/28/2023 05:30    Assessment  51 y.o. male with a history of CAD, Wolff-Parkinson-White syndrome s/p ablation, chronic pain, diastolic heart failure, GERD, HTN, adenomatous colon polyps, gastric ulcers, ulcerative esophagitis, alcohol abuse who presented to the ED with complaint of abdominal pain/distention, vomiting, and constipation.  Found to have mild anemia, hyponatremia, hypokalemia, elevated LFTs and bilirubin with elevated ethanol level.  CT with contrast with severe hepatic steatosis with  progressive findings of cirrhosis and small volume ascites.  GI consulted given abdominal pain  and cirrhosis.  Constipation, abdominal pain/distention, N/V: Patient continues to have generalized abdominal pain with distention.  Patient is unsure when his last bowel movement was, possibly 7+ days prior. Smear documented on 8/14 but nothing significant.  Paracentesis attempted this admission for therapeutic and diagnostic purposes however ultrasound revealed insufficient ascites for drainage.  KUB obtained with no evidence of obstruction but with prominent gaseous distention of the colon noted.  Suspect distention is related to constipation, Linzess 290 started yesterday however has not had a bowel movement today.  Given some mild drowsiness we will stop daily MiraLAX and add lactulose in conjunction with Linzess.  Nausea and vomiting is likely secondary to ongoing EtOH use causing uncontrolled reflux and gastritis.  Continue with PPI twice daily 1 patient, will likely need at least daily PPI outpatient.  Cirrhosis: CT dismission concerning for cirrhosis likely secondary to alcohol use.  Patient has admitted to drinking one 22 ounce beer daily which is improvement from 3-4 daily prior.  Currently remains decompensated given lower extremity edema, thrombocytopenia, and mild ascites. MELD 3.0 remains stable at 25, AST mildly improving.  Previously on Lasix 40 mg daily outpatient but has been on hold given hypokalemia and hyponatremia which is continued to be corrected with replacements.  No asterixis on exam today however patient continues to be drowsy and is disoriented to time today.  Given this mild drowsiness and insufficiency with constipation I will start lactulose 10 g twice daily.  Maddrey DF 30.7 with PT control 13 yesterday.  Not a candidate for steroids.  Liver Doppler performed this admission with severe hepatic steatosis and evidence of portal hypertension with patent portal vein.  Anemia, melena/brbpr: Presented with hemoglobin 11.7.  Hemoglobin has drifted to 10.7 then improved to 11 and  now down to 10.2 today.  Suspect this is somewhat hemodilutional given multiple IV replacements.  Iron studies this admission with normal ferritin, iron, and elevated saturation.  Patient initially reported an episode of rectal bleeding and melena a few days prior but has not had any recurrent symptoms.  EGD earlier this year with ulcerative esophagitis without EV or GV.  Colonoscopy in 2014 with tubular adenomas, currently overdue for surveillance.  Will continue to monitor for GI bleed and would consider colonoscopy +/- EGD for further drop in hemoglobin.  Hypokalemia and hyponatremia: Continues to have hyponatremia with sodium 129-132 and potassium 2.4-2.9.  Receiving replacements again today.  Will need to resume diuretics once electrolytes more normalized.   Plan / Recommendations  Daily BMP Linzess 290 mcg daily Start lactulose 10 g twice daily, make miralax prn Continue to monitor for HE ETOH cessation PPI BID 2g sodium diet Continue to hold diuretics today, resume Lasix 40 and add spironolactone 100 mg daily when creatinine and electrolytes improve.  Continue carvedilol Needs outpatient colonoscopy surveillance of colon polyps    LOS: 3 days    05/01/2023, 9:55 AM   Brooke Bonito, MSN, FNP-BC, AGACNP-BC [facility]

## 2023-05-01 NOTE — Progress Notes (Signed)
PROGRESS NOTE   Timothy [name]  ZOX:[id number] DOB: 1971/03/29 DOA: 04/28/2023 PCP: Elfredia Nevins, MD   Chief Complaint  Patient presents with   Abdominal Pain   Level of care: Stepdown  Brief Admission History:  Timothy [name] is a 52 year old male with history of HTN, Barrett esophagus, PUD, WPW status post ablation, NICM, alcohol abuse, and liver disease who presents with worsening abdominal distention and pain.  For past 2-3 days prior to arrival he is reporting his bellybutton is popping out now.  Voiding and nonbilious nonbloody emesis difficulty having bowel movements.   ED: Blood pressure (!) 153/88, pulse (!) 111, temperature 98.5 F (36.9 C), resp. rate 11, height 6' (1.829 m), weight 111.1 kg, SpO2 95%.  He is found to have potassium of 2.4, AST 179, total bilirubin 3.6, platelets 126, and prolonged QT interval.  CT demonstrates progressive cirrhotic changes and new small volume ascites.  He was treated with fentanyl, magnesium and potassium, Zofran, and a liter of saline in the ED.  ED requesting admission for further electrolyte correction and evaluation for decompensated liver cirrhosis.   8/16: Remained lethargic and little drowsy.  Sodium at 128 and potassium of 2.9.  Developed significant left flank bruising with some induration and tenderness.  CT abdomen was ordered to rule out any bleeding.  Hemoglobin decreased to 10.1 from 11.  T. bili stable but remained elevated at 5.6.  MELD score at 25. GI started him on lactulose as he remained constipated. Holding start of diuretic with furosemide and spironolactone until electrolytes improves. Liver Doppler studies with severe hepatic steatosis, evidence of portal hypertension with patent portal vein.Maddrey DF 30.7 with PT control 13 yesterday. Not a candidate for steroids .   Assessment and Plan:  Decompensated Alcoholic Liver Cirrhosis  Generalized Abdominal pain - Due to progressive alcoholic liver cirrhosis,  ascites, abdominal distention.  Failed paracentesis attempt due to very mild ascites.  Remained constipated. -Started on lactulose -appreciate GI service consultation and recommendations -hopefully can start furosemide/spironolactone when electrolytes are improved  -KUB suggesting large amt of bowel gas -Liver Doppler studies with severe steatosis, portal hypertension and patent portal vein.  Left flank bruising.  Patient denies any trauma or fall.  Per wife he started getting small amount of bruising yesterday while in the hospital with significant increase today, some associated tenderness and induration.  Hemoglobin decreased to 10.1 now from 11. -Repeat CT abdomen to rule out any underlying bleeding/hematoma. -Monitor hemoglobin  Hypomagnesemia - Repleted with IV magnesium  Barrett's esophagus without dysplasia - continue PPI  Alcohol Abuser/History of DTs -Monitoring closely, following CIWA protocol -continue daily thiamine, folate replacement   Opioid dependence Chronic Pain  Opioid induced chronic constipation  -Pt reports being on opioids since 1990s and has withdrawal symptoms if not receiving -we did resume his home oxycodone 15 mg Q6h prn -IV opioids being reduced as much as able  -laxatives being managed by GI, small BM yesterday, still having some flatus  Decompensated liver cirrhosis.  CT abdomen concerning for liver cirrhosis, likely secondary to alcohol use.  MELD score of 25, Maddrey DF 30.7-not a candidate for steroid. GI is on board.  Patient was on Lasix as outpatient which is currently being held due to persistent hypokalemia and hyponatremia.  Appears lethargic and somnolent. -GI is planning to restart Lasix and spironolactone once electrolytes are improved -Lactulose was added-need to titrate for 2-3 soft bowel movements daily  Dyslipidemia - Holding statins due to elevated LFTs due to alcohol  use abuse liver cirrhosis  Acute HFmrEF -Monitor I's and  O's -Limit IV fluid resuscitation -Last echo 12/19/2020:  LVEF 45-50%, mildly decreased LV function moderate LVH, grade 1 diastolic dysfunction impaired relaxation  GERD (gastroesophageal reflux disease) Continue PPI  History of Wolff-Parkinson-White syndrome Status post ablation, monitoring  Hypokalemia - additional IV Mg and K given with oral K  - recheck in AM   Essential hypertension - BPs are soft at this time, following closely   DVT prophylaxis: SCDs Code Status: Full  Family Communication:  Disposition: anticipate home when medically stabilized     Consultants:  GI  Procedures:  Failed attempt of paracentesis  Antimicrobials:    Subjective: Patient remained lethargic and was complaining of left flank pain.   Developed a large bruise.  Denies any trauma or fall.  Objective: Vitals:   05/01/23 0500 05/01/23 0600 05/01/23 0800 05/01/23 1215  BP: (!) 148/111 (!) 106/58 (!) 145/83   Pulse: 85 80 87   Resp:  15    Temp:    99.5 F (37.5 C)  TempSrc:    Oral  SpO2: 98% 92% 97%   Weight: 118.6 kg     Height:        Intake/Output Summary (Last 24 hours) at 05/01/2023 1609 Last data filed at 05/01/2023 1300 Gross per 24 hour  Intake 1200 ml  Output 1100 ml  Net 100 ml   Filed Weights   04/29/23 0501 04/30/23 0417 05/01/23 0500  Weight: 117.7 kg 118.4 kg 118.6 kg   Examination:  General.  Chronically ill-appearing obese gentleman, in no acute distress.  Scleral icterus Pulmonary.  Lungs clear bilaterally, normal respiratory effort. CV.  Regular rate and rhythm, no JVD, rub or murmur. Abdomen.  Soft, nontender, nondistended, BS positive.  Significant bruising with some associated tenderness and induration involving left flank area. CNS.  Little somnolent but able to answer questions appropriately.  No focal neurologic deficit. Extremities.  No edema, no cyanosis, pulses intact and symmetrical. Psychiatry.  Appears to have mild cognitive impairment  Data  Reviewed: I have personally reviewed following labs and imaging studies  CBC: Recent Labs  Lab 04/28/23 0346 04/29/23 0507 04/30/23 0522 05/01/23 0446 05/01/23 1439  WBC 9.2 8.0 7.6 7.5 8.2  NEUTROABS 6.7  --   --   --   --   HGB 11.7* 10.7* 11.0* 10.2* 10.1*  HCT 33.8* 31.4* 32.5* 30.5* 30.1*  MCV 92.1 94.9 96.7 98.1 97.7  PLT 126* 105* 107* 120* 124*    Basic Metabolic Panel: Recent Labs  Lab 04/28/23 0346 04/28/23 0945 04/29/23 0507 04/30/23 0522 05/01/23 0446  NA 132* 131* 128* 129* 128*  K 2.4* 2.6* 2.5* 2.9* 2.9*  CL 87* 90* 91* 91* 92*  CO2 31 29 30 29 29   GLUCOSE 110* 93 69* 80 85  BUN 7 7 10 13 13   CREATININE 1.19 1.09 1.10 1.22 1.25*  CALCIUM 8.2* 7.8* 8.1* 8.1* 8.2*  MG 1.6*  --  1.8 2.0  --     CBG: Recent Labs  Lab 04/28/23 0809 04/29/23 0724 04/30/23 0729 05/01/23 1158 05/01/23 1606  GLUCAP 98 89 74 105* 97    Recent Results (from the past 240 hour(s))  MRSA Next Gen by PCR, Nasal     Status: None   Collection Time: 04/29/23 12:25 AM   Specimen: Nasal Mucosa; Nasal Swab  Result Value Ref Range Status   MRSA by PCR Next Gen NOT DETECTED NOT DETECTED Final    Comment: (  NOTE) The GeneXpert MRSA Assay (FDA approved for NASAL specimens only), is one component of a comprehensive MRSA colonization surveillance program. It is not intended to diagnose MRSA infection nor to guide or monitor treatment for MRSA infections. Test performance is not FDA approved in patients less than 60 years old. Performed at [facility], [address], [place], [place] [address]      Radiology Studies: US ABDOMEN LIMITED WITH LIVER DOPPLER  Result Date: 04/30/2023 CLINICAL DATA:  52 year old male with history of cirrhosis. EXAM: DUPLEX ULTRASOUND OF LIVER TECHNIQUE: Color and duplex Doppler ultrasound was performed to evaluate the hepatic in-flow and out-flow vessels. COMPARISON:  CT abdomen pelvis from 04/28/2023, 02/01/2013 FINDINGS: Liver: Diffusely increased  echogenicity. Glisson's capsule is invisible amongst the portal veins. Mildly nodular contour. No focal lesion, mass or intrahepatic biliary ductal dilatation. Main Portal Vein size: 0.7 cm Portal Vein Velocities Main Prox:  51 cm/sec, hepatofugal Main Mid: 36 cm/sec, hepatofugal Main Dist:  38 cm/sec, hepatofugal Right: Not visualized. Left: 24 cm/sec, hepatofugal Hepatic Vein Velocities Right:  53 cm/sec, antegrade Middle:  36 cm/sec, antegrade Left:  32 cm/sec, antegrade IVC: Not visualized. Hepatic Artery Velocity:  173 cm/sec Splenic Vein Velocity:  28 cm/sec Spleen: 12.7 cm x 12.8 cm x 6.2 cm with a total volume of 529 cm^3 (411 cm^3 is upper limit normal) Portal Vein Occlusion/Thrombus: No Splenic Vein Occlusion/Thrombus: No Ascites: Trace Varices: None IMPRESSION: 1. Diffuse severe echogenicity throughout the hepatic parenchyma as could be seen with severe hepatic steatosis or in the acute setting of hepatitis. 2. Morphologic changes of cirrhosis with portal hypertension is evidence by mild splenomegaly, trace ascites, and hepatofugal flow the visualized portal system which is widely patent. Marliss Coots, MD [facility] Radiology Electronically Signed   By: Marliss Coots M.D.   On: 04/30/2023 15:35    Scheduled Meds:  carvedilol  6.25 mg Oral BID WC   Chlorhexidine Gluconate Cloth  6 each Topical Daily   enoxaparin (LOVENOX) injection  55 mg Subcutaneous Q24H   folic acid  1 mg Oral Daily   lactulose  10 g Oral BID   linaclotide  290 mcg Oral QAC breakfast   multivitamin with minerals  1 tablet Oral Daily   nicotine  21 mg Transdermal Daily   pantoprazole (PROTONIX) IV  40 mg Intravenous Q12H   sodium chloride flush  3 mL Intravenous Q12H   sucralfate  1 g Oral BID   thiamine  100 mg Oral Daily   Or   thiamine  100 mg Intravenous Daily   Continuous Infusions:   LOS: 3 days   Time spent: 50 mins  Arnetha Courser, MD How to contact the [facility]  Attending or Consulting provider 7A - 7P or covering provider during after hours 7P -7A, for this patient?  Check the care team in [facility] and look for a) attending/consulting TR[facility] provider listed and b) the [facility] team listed Log into [email or web address] and use [place]'s universal password to access. If you do not have the password, please contact the hospital operator. Locate the [facility] provider you are looking for under [facility] and page to a number that you can be directly reached. If you still have difficulty reaching the provider, please page the [facility] (Director on Call) for the Hospitalists listed on amion for assistance.  05/01/2023, 4:09 PM

## 2023-05-01 NOTE — Progress Notes (Signed)
Occupational Therapy Treatment Patient Details Name: Timothy [name] MRN: [id number] DOB: 01/07/1971 Today's Date: 05/01/2023   History of present illness Timothy [name] is a 52 year old male with history of HTN,  Barrett esophagus, PUD, WPW status post ablation, NICM, alcohol abuse, and liver disease who presents with worsening abdominal distention and pain.  For past 2-3 days.  Reporting his bellybutton is popping out now.  Voiding and nonbilious nonbloody emesis difficulty having bowel movements.   OT comments  Pt agreeable to OT treatment session. Pt received sitting up in bed. Required min assist/HHA for bed mobility. Completed sit to stand t/f with min assist and use of RW. Pt able to side step to chair with min assist and VC for use of walker. Chair alarm set and call bell within reach at end of session with RN present. D/c recommendations remain appropriate at this time. Pt would continue to benefit from OT services while in acute care setting.       If plan is discharge home, recommend the following:  A lot of help with walking and/or transfers;A lot of help with bathing/dressing/bathroom;Assistance with cooking/housework;Assist for transportation;Help with stairs or ramp for entrance   Equipment Recommendations  None recommended by OT          Precautions / Restrictions Precautions Precautions: Fall Restrictions Weight Bearing Restrictions: No       Mobility Bed Mobility Overal bed mobility: Needs Assistance Bed Mobility: Supine to Sit     Supine to sit: Min assist Sit to supine: Min assist   General bed mobility comments: increased time and labored movement; hand held assist    Transfers Overall transfer level: Needs assistance Equipment used: Rolling walker (2 wheels) Transfers: Sit to/from Stand, Bed to chair/wheelchair/BSC Sit to Stand: Min assist     Step pivot transfers: Min assist     General transfer comment: Unsteady in standing; extended time and  labored effort.     Balance Overall balance assessment: Needs assistance Sitting-balance support: Feet supported, Bilateral upper extremity supported Sitting balance-Leahy Scale: Fair Sitting balance - Comments: seated at EOB   Standing balance support: Bilateral upper extremity supported, During functional activity, Reliant on assistive device for balance Standing balance-Leahy Scale: Fair Standing balance comment: using RW                            Cognition Arousal: Alert Behavior During Therapy: WFL for tasks assessed/performed, Anxious Overall Cognitive Status: Within Functional Limits for tasks assessed                                                     Pertinent Vitals/ Pain       Pain Assessment Pain Assessment: Faces Faces Pain Scale: Hurts little more Pain Location: abdominal area Pain Descriptors / Indicators: Grimacing, Guarding Pain Intervention(s): Limited activity within patient's tolerance   Frequency  Min 2X/week        Progress Toward Goals  OT Goals(current goals can now be found in the care plan section)  Progress towards OT goals: Progressing toward goals  Acute Rehab OT Goals Potential to Achieve Goals: Fair ADL Goals Pt Will Perform Grooming: with contact guard assist;standing Pt Will Perform Upper Body Bathing: with modified independence;sitting Pt Will Perform Lower Body Bathing: with min assist;sitting/lateral leans Pt Will  Perform Upper Body Dressing: with modified independence;sitting Pt Will Perform Lower Body Dressing: with min assist;sitting/lateral leans Pt Will Transfer to Toilet: with contact guard assist;ambulating Pt Will Perform Toileting - Clothing Manipulation and hygiene: with modified independence;sitting/lateral leans Pt/caregiver will Perform Home Exercise Program: Increased ROM;Increased strength;Right Upper extremity;Left upper extremity;Independently                  OT goals  addressed during session: ADL's and self-care         End of Session Equipment Utilized During Treatment: Rolling walker (2 wheels)  OT Visit Diagnosis: Unsteadiness on feet (R26.81);Other abnormalities of gait and mobility (R26.89);Muscle weakness (generalized) (M62.81)   Activity Tolerance Patient tolerated treatment well   Patient Left in chair;with call bell/phone within reach;with nursing/sitter in room;with chair alarm set   Nurse Communication Mobility status        Time: 1610-9604 OT Time Calculation (min): 26 min  Charges: OT General Charges $OT Visit: 1 Visit OT Treatments $Self Care/Home Management : 23-37 mins   Bevelyn Ngo, OTR/L  05/01/2023, 9:23 AM

## 2023-05-01 NOTE — Progress Notes (Addendum)
Notified Dr. Camillo Flaming of K 2.9 this morning.  See new order.  Also was notified by Oletha Cruel, RN about left flank pain and noted large bruise to left flank, with slight blood tinged/tea colored urine. Hgb 11.0 8/15 and 10.2 this morning.  He is not sure how he got the large bruise to his left flank.

## 2023-05-02 DIAGNOSIS — K7031 Alcoholic cirrhosis of liver with ascites: Secondary | ICD-10-CM | POA: Diagnosis not present

## 2023-05-02 DIAGNOSIS — R1084 Generalized abdominal pain: Secondary | ICD-10-CM | POA: Diagnosis not present

## 2023-05-02 LAB — COMPREHENSIVE METABOLIC PANEL
ALT: 34 U/L (ref 0–44)
AST: 143 U/L — ABNORMAL HIGH (ref 15–41)
Albumin: 2.2 g/dL — ABNORMAL LOW (ref 3.5–5.0)
Alkaline Phosphatase: 177 U/L — ABNORMAL HIGH (ref 38–126)
Anion gap: 8 (ref 5–15)
BUN: 11 mg/dL (ref 6–20)
CO2: 26 mmol/L (ref 22–32)
Calcium: 8.4 mg/dL — ABNORMAL LOW (ref 8.9–10.3)
Chloride: 95 mmol/L — ABNORMAL LOW (ref 98–111)
Creatinine, Ser: 1.09 mg/dL (ref 0.61–1.24)
GFR, Estimated: >60 mL/min (ref >60)
Glucose, Bld: 92 mg/dL (ref 70–99)
Potassium: 3.1 mmol/L — ABNORMAL LOW (ref 3.5–5.1)
Sodium: 129 mmol/L — ABNORMAL LOW (ref 135–145)
Total Bilirubin: 6.9 mg/dL — ABNORMAL HIGH (ref 0.3–1.2)
Total Protein: 6.3 g/dL — ABNORMAL LOW (ref 6.5–8.1)

## 2023-05-02 LAB — CBC
HCT: 29.6 % — ABNORMAL LOW (ref 39.0–52.0)
Hemoglobin: 9.9 g/dL — ABNORMAL LOW (ref 13.0–17.0)
MCH: 32.6 pg (ref 26.0–34.0)
MCHC: 33.4 g/dL (ref 30.0–36.0)
MCV: 97.4 fL (ref 80.0–100.0)
Platelets: 119 10*3/uL — ABNORMAL LOW (ref 150–400)
RBC: 3.04 MIL/uL — ABNORMAL LOW (ref 4.22–5.81)
RDW: 19.9 % — ABNORMAL HIGH (ref 11.5–15.5)
WBC: 6.3 10*3/uL (ref 4.0–10.5)
nRBC: 0 % (ref 0.0–0.2)

## 2023-05-02 LAB — GLUCOSE, CAPILLARY: Glucose-Capillary: 86 mg/dL (ref 70–99)

## 2023-05-02 LAB — PROTIME-INR
INR: 1.4 — ABNORMAL HIGH (ref 0.8–1.2)
Prothrombin Time: 17.7 s — ABNORMAL HIGH (ref 11.4–15.2)

## 2023-05-02 MED ORDER — KETOROLAC TROMETHAMINE 15 MG/ML IJ SOLN
15.0000 mg | Freq: Once | INTRAMUSCULAR | Status: AC
  Administered 2023-05-02: 15 mg via INTRAVENOUS
  Filled 2023-05-02: qty 1

## 2023-05-02 MED ORDER — HYDROMORPHONE HCL 1 MG/ML IJ SOLN
0.5000 mg | INTRAMUSCULAR | Status: DC | PRN
  Administered 2023-05-02 – 2023-05-04 (×7): 0.5 mg via SUBCUTANEOUS
  Filled 2023-05-02 (×7): qty 0.5

## 2023-05-02 MED ORDER — HYDROMORPHONE HCL 1 MG/ML IJ SOLN: 0.5000 mg | INTRAMUSCULAR | Status: DC | PRN

## 2023-05-02 MED ORDER — LACTULOSE 10 GM/15ML PO SOLN
20.0000 g | Freq: Two times a day (BID) | ORAL | Status: DC
  Administered 2023-05-02 – 2023-05-03 (×2): 20 g via ORAL
  Filled 2023-05-02 (×2): qty 30

## 2023-05-02 MED ORDER — POTASSIUM CHLORIDE 20 MEQ PO PACK
40.0000 meq | PACK | Freq: Once | ORAL | Status: AC
  Administered 2023-05-02: 40 meq via ORAL
  Filled 2023-05-02: qty 2

## 2023-05-02 NOTE — Progress Notes (Addendum)
PROGRESS NOTE    Timothy [name]   XLK:[id number] DOB: [birth year]  DOA: 04/28/2023 Date of Service: 05/02/23 PCP: Elfredia Nevins, MD     Brief Narrative / Hospital Course:  Timothy [name] is a 52 year old male with history of HTN, Barrett esophagus, PUD, WPW status post ablation, NICM, alcohol abuse, and liver disease who presents with worsening abdominal distention and pain. 08/13: to ED. Mult electrolyte derangement, low Plt. CT of the abdomen pelvis with IV contrast showed presence of new cirrhotic changes with small volume of ascites. Admitted w/ decompensated cirrhosis/ascites. GI consult. Plan paracentesis. Plan start furosemide/spironolactone when electrolytes are improved 08/14: Ultrasound today reveals trace perihepatic ascites insufficient for tap. Subsequent plain films revealed nonspecific bowel gas pattern with some increase in colonic air. No evidence of obstruction, impaction or free air. Plan liver doppler.  08/15: Liver doppler done. Add spironolactone 100mg  daily/continue lasix 40mg  daily. More aggressive bowel regimen given no signs of obstruction and narcotic use. Will start Linzess daily. Can titrate accordingly.  08/16: Liver Doppler studies with severe steatosis, portal hypertension and patent portal vein. Start lactulose 10 g twice daily. Holding diuretics. Repeated CT to eval L flank bruise and drop Hgb, no hematoma 08/17: still off lasix/spiro, stable to transfer out of stepdown. Follow GI recs - consider repeat US / attempt paracentesis?     Consultants:  Gastroenterology   Procedures: None       ASSESSMENT & PLAN:   Principal Problem:   Abdominal pain Active Problems:   Alcoholic cirrhosis (HCC)   Anxiety state   Barrett's esophagus without dysplasia   Hypomagnesemia   Essential hypertension   Hypokalemia   History of Wolff-Parkinson-White syndrome   GERD (gastroesophageal reflux disease)   Acute CHF (congestive heart failure) (HCC)    ETOH abuse   Dyslipidemia   Elevated LFTs   Alcohol withdrawal/DTs  Decompensated Alcoholic Liver Cirrhosis  Generalized Abdominal pain Progressive alcoholic liver cirrhosis and severe steatosis resulting in portal HTN, ascites, abdominal distention.   Hyponatremia d/t liver disease, EtOH Thrombocytopenia d/t liver disease MELD score of 25, Maddrey DF 30.7-not a candidate for steroid. Liver Doppler without evidence of PVT.  Not enough ascites for paracentesis.  No signs of SBP.  GI following Lasix holding for now  Cleda Daub holding for now  Lactulose 10 g bid --> 20 bid  2g sodium diet  Absolute EtOH cessation  Needs outpatient colonoscopy for surveillance purposes. Can consider EGD at the same time to evaluate for varices as well as to screen for Barrett's esophagus.   Constipation  Complicated by opiate dependence, ascites  Lactulose as above  Linzess 290 mcg daily    Anemia stable no evidence of active GI bleeding.  Previous EGD with ulcerative esophagitis.  Continue to monitor hemoglobin no plans for inpatient endoscopic evaluation  Continue PPI twice daily.   Left flank bruising.   No hematoma on CT  Monitor on physical exam Monitor HH/CBC   Hypomagnesemia Hypokalemia Replace as needed Monitor BMP, Mg   Barrett's esophagus without dysplasia GERD Continue PPI twice daily.   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/19/2020:  LVEF 45-50%, mildly decreased LV function moderate  LVH, grade 1 diastolic dysfunction impaired relaxation Monitor I's and O's Limit IV fluid resuscitation  History of Wolff-Parkinson-White syndrome Status post ablation, monitoring  Essential hypertension BPs are soft at this time, following closely  Dyslipidemia Holding statins due to elevated LFTs due to alcohol use abuse liver cirrhosis     DVT prophylaxis: SCD d/t low Plt Pertinent IV fluids/nutrition: no continuous IV fluids  Central lines / invasive devices: none  Code Status: FULL CODE ACP documentation reviewed: 05/02/23 none on file   Current Admission Status: inpatient TOC needs / Dispo plan: SNF rehab when stable Barriers to discharge / significant pending items: placement pending, pending clinical improvement / tolerating lasix/spiro              Subjective / Brief ROS:  Patient reports severe pain Denies CP/SOB.  Denies new weakness.  Tolerating diet.  Reports urinary incontinence Reports persistent constipation   Family Communication: support person at bedside on rounds     Objective Findings:  Vitals:   05/02/23 0800 05/02/23 0900 05/02/23 1122 05/02/23 1714  BP:    128/86  Pulse: 82 84  76  Resp: (!) 25 16  18   Temp:   98.5 F (36.9 C) 97.9 F (36.6 C)  TempSrc:   Axillary Oral  SpO2: 95% 97%  97%  Weight:      Height:        Intake/Output Summary (Last 24 hours) at 05/02/2023 1822 Last data filed at 05/02/2023 1610 Gross per 24 hour  Intake 1080 ml  Output 2000 ml  Net -920 ml   Filed Weights   04/30/23 0417 05/01/23 0500 05/02/23 0434  Weight: 118.4 kg 118.6 kg 118.6 kg    Examination:  Physical Exam Constitutional:      General: He is not in acute distress.    Appearance: He is obese.  Cardiovascular:     Rate and Rhythm: Normal rate and regular rhythm.  Pulmonary:     Effort: Pulmonary effort is normal.     Breath sounds: Normal breath sounds.  Abdominal:     General: Bowel sounds are normal. There is distension.      Palpations: There is fluid wave.     Tenderness: There is abdominal tenderness.  Skin:    General: Skin is warm and dry.  Neurological:     General: No focal deficit present.     Mental Status: He is alert.  Psychiatric:        Mood and Affect: Mood normal.        Behavior: Behavior normal.          Scheduled Medications:   carvedilol  6.25 mg Oral BID WC   Chlorhexidine Gluconate Cloth  6 each Topical Daily   folic acid  1 mg Oral Daily   lactulose  20 g Oral BID   linaclotide  290 mcg Oral QAC breakfast   multivitamin with minerals  1 tablet Oral Daily   nicotine  21 mg Transdermal Daily   pantoprazole (PROTONIX) IV  40 mg Intravenous Q12H   sodium chloride flush  3 mL Intravenous Q12H   sucralfate  1 g Oral BID   thiamine  100 mg Oral Daily   Or   thiamine  100 mg Intravenous Daily    Continuous Infusions:   PRN Medications:  bisacodyl, HYDROmorphone (DILAUDID)  injection, hydrOXYzine, ipratropium, levalbuterol, ondansetron **OR** ondansetron (ZOFRAN) IV, mouth rinse, oxyCODONE, polyethylene glycol, senna-docusate, sodium phosphate, zolpidem  Antimicrobials from admission:  Anti-infectives (From admission, onward)    None           Data Reviewed:  I have personally reviewed the following...  CBC: Recent Labs  Lab 04/28/23 0346 04/29/23 0507 04/30/23 0522 05/01/23 0446 05/01/23 1439 05/02/23 0346  WBC 9.2 8.0 7.6 7.5 8.2 6.3  NEUTROABS 6.7  --   --   --   --   --   HGB 11.7* 10.7* 11.0* 10.2* 10.1* 9.9*  HCT 33.8* 31.4* 32.5* 30.5* 30.1* 29.6*  MCV 92.1 94.9 96.7 98.1 97.7 97.4  PLT 126* 105* 107* 120* 124* 119*   Basic Metabolic Panel: Recent Labs  Lab 04/28/23 0346 04/28/23 0945 04/29/23 0507 04/30/23 0522 05/01/23 0446 05/02/23 0346  NA 132* 131* 128* 129* 128* 129*  K 2.4* 2.6* 2.5* 2.9* 2.9* 3.1*  CL 87* 90* 91* 91* 92* 95*  CO2 31 29 30 29 29 26   GLUCOSE 110* 93 69* 80 85 92  BUN 7 7 10 13 13 11   CREATININE 1.19 1.09  1.10 1.22 1.25* 1.09  CALCIUM 8.2* 7.8* 8.1* 8.1* 8.2* 8.4*  MG 1.6*  --  1.8 2.0  --   --    GFR: Estimated Creatinine Clearance: 103 mL/min (by C-G formula based on SCr of 1.09 mg/dL). Liver Function Tests: Recent Labs  Lab 04/28/23 0945 04/29/23 0507 04/30/23 0522 05/01/23 0446 05/02/23 0346  AST 157* 142* 152* 147* 143*  ALT 39 34 36 34 34  ALKPHOS 220* 204* 207* 191* 177*  BILITOT 3.9* 5.7* 5.9* 5.6* 6.9*  PROT 6.8 6.6 6.8 6.5 6.3*  ALBUMIN 2.4* 2.3* 2.4* 2.3* 2.2*   Recent Labs  Lab 04/28/23 0346  LIPASE 53*   No results for input(s): "AMMONIA" in the last 168 hours. Coagulation Profile: Recent Labs  Lab 04/29/23 0507 04/30/23 0522 05/01/23 0446 05/02/23 0346  INR 1.7* 1.5* 1.5* 1.4*   Cardiac Enzymes: No results for input(s): "CKTOTAL", "CKMB", "CKMBINDEX", "TROPONINI" in the last 168 hours. BNP (last 3 results) No results for input(s): "PROBNP" in the last 8760 hours. HbA1C: No results for input(s): "HGBA1C" in the last 72 hours. CBG: Recent Labs  Lab 04/29/23 0724 04/30/23 0729 05/01/23 1158 05/01/23 1606 05/02/23 0720  GLUCAP 89 74 105* 97 86   Lipid Profile: No results for input(s): "CHOL", "HDL", "LDLCALC", "TRIG", "CHOLHDL", "LDLDIRECT" in the last 72 hours. Thyroid Function Tests: No results for input(s): "TSH", "T4TOTAL", "FREET4", "T3FREE", "THYROIDAB" in the last 72 hours. Anemia Panel: No results for input(s): "VITAMINB12", "FOLATE", "FERRITIN", "TIBC", "IRON", "RETICCTPCT" in the last 72 hours. Most Recent Urinalysis On File:     Component Value Date/Time   COLORURINE YELLOW 04/11/2016 1044   APPEARANCEUR CLEAR 04/11/2016 1044   LABSPEC 1.011 04/11/2016 1044   PHURINE 7.5 04/11/2016 1044   GLUCOSEU NEGATIVE 04/11/2016 1044   HGBUR NEGATIVE 04/11/2016 1044   BILIRUBINUR NEGATIVE 04/11/2016 1044   KETONESUR NEGATIVE 04/11/2016 1044   PROTEINUR NEGATIVE 04/11/2016 1044   UROBILINOGEN 0.2 10/29/2014 0937   NITRITE NEGATIVE  04/11/2016 1044   LEUKOCYTESUR NEGATIVE 04/11/2016 1044   Sepsis Labs: @LABRCNTIP (procalcitonin:4,lacticidven:4) Microbiology: Recent Results (from the past 240 hour(s))  MRSA Next Gen by PCR, Nasal     Status: None   Collection Time: 04/29/23 12:25 AM   Specimen: Nasal Mucosa; Nasal Swab  Result Value Ref Range Status   MRSA by PCR Next Gen  NOT DETECTED NOT DETECTED Final    Comment: (NOTE) The GeneXpert MRSA Assay (FDA approved for NASAL specimens only), is one component of a comprehensive MRSA colonization surveillance program. It is not intended to diagnose MRSA infection nor to guide or monitor treatment for MRSA infections. Test performance is not FDA approved in patients less than 90 years old. Performed at [facility], [address] [place], [place] [address]       Radiology Studies last 3 days: CT ABDOMEN WO CONTRAST  Result Date: 05/01/2023 CLINICAL DATA:  Inpatient. Extensive left flank bruising. Generalized abdominal pain. No known injury. EXAM: CT ABDOMEN WITHOUT CONTRAST TECHNIQUE: Multidetector CT imaging of the abdomen was performed following the standard protocol without IV contrast. RADIATION DOSE REDUCTION: This exam was performed according to the departmental dose-optimization program which includes automated exposure control, adjustment of the mA and/or kV according to patient size and/or use of iterative reconstruction technique. COMPARISON:  04/28/2023 CT abdomen/pelvis. FINDINGS: Lower chest: No significant pulmonary nodules or acute consolidative airspace disease. Coronary atherosclerosis. Hepatobiliary: Severe diffuse hepatic steatosis. Relative hypertrophy of caudate lobe with lobulated liver contour, cannot exclude cirrhosis. No discrete liver mass on this noncontrast exam. Somewhat contracted gallbladder with minimal layering hyperdense material, favoring vicarious excretion of contrast from recent IV contrast CT study, without definite gallbladder wall  thickening. No biliary ductal dilatation. Pancreas: Normal, with no mass or duct dilation. Spleen: Normal size. No mass. Adrenals/Urinary Tract: Normal adrenals. No renal stones. No hydronephrosis. Simple 3.4 cm anterior interpolar left renal cyst, for which no follow-up imaging is recommended. Stomach/Bowel: Normal non-distended stomach. Visualized small and large bowel is normal caliber, with no bowel wall thickening. Vascular/Lymphatic: Atherosclerotic nonaneurysmal abdominal aorta. Mild paraumbilical and ventral right abdominal varices. No pathologically enlarged lymph nodes in the abdomen. Other: Small volume perihepatic ascites. No focal fluid collection. No pneumoperitoneum. Mild-to-moderate anasarca is worsened. No evidence of retroperitoneal hematoma. Musculoskeletal: No aggressive appearing focal osseous lesions. Mild thoracolumbar spondylosis. IMPRESSION: 1. No evidence of retroperitoneal hematoma. 2. Severe diffuse hepatic steatosis. Relative hypertrophy of the caudate lobe with lobulated liver contour, cannot exclude cirrhosis. No discrete liver mass on this noncontrast exam. 3. Small volume perihepatic ascites. Mild paraumbilical and ventral right abdominal varices. Mild-to-moderate anasarca is worsened. 4. Coronary atherosclerosis. 5.  Aortic Atherosclerosis (ICD10-I70.0). Electronically Signed   By: Delbert Phenix M.D.   On: 05/01/2023 19:23   US ABDOMEN LIMITED WITH LIVER DOPPLER  Result Date: 04/30/2023 CLINICAL DATA:  52 year old male with history of cirrhosis. EXAM: DUPLEX ULTRASOUND OF LIVER TECHNIQUE: Color and duplex Doppler ultrasound was performed to evaluate the hepatic in-flow and out-flow vessels. COMPARISON:  CT abdomen pelvis from 04/28/2023, 02/01/2013 FINDINGS: Liver: Diffusely increased echogenicity. Glisson's capsule is invisible amongst the portal veins. Mildly nodular contour. No focal lesion, mass or intrahepatic biliary ductal dilatation. Main Portal Vein size: 0.7 cm Portal  Vein Velocities Main Prox:  51 cm/sec, hepatofugal Main Mid: 36 cm/sec, hepatofugal Main Dist:  38 cm/sec, hepatofugal Right: Not visualized. Left: 24 cm/sec, hepatofugal Hepatic Vein Velocities Right:  53 cm/sec, antegrade Middle:  36 cm/sec, antegrade Left:  32 cm/sec, antegrade IVC: Not visualized. Hepatic Artery Velocity:  173 cm/sec Splenic Vein Velocity:  28 cm/sec Spleen: 12.7 cm x 12.8 cm x 6.2 cm with a total volume of 529 cm^3 (411 cm^3 is upper limit normal) Portal Vein Occlusion/Thrombus: No Splenic Vein Occlusion/Thrombus: No Ascites: Trace Varices: None IMPRESSION: 1. Diffuse severe echogenicity throughout the hepatic parenchyma as could be seen with severe hepatic steatosis  or in the acute setting of hepatitis. 2. Morphologic changes of cirrhosis with portal hypertension is evidence by mild splenomegaly, trace ascites, and hepatofugal flow the visualized portal system which is widely patent. Marliss Coots, MD [facility] Radiology Electronically Signed   By: Marliss Coots M.D.   On: 04/30/2023 15:35   DG Abd 1 View  Result Date: 04/29/2023 CLINICAL DATA:  Abdominal distension. EXAM: ABDOMEN - 1 VIEW COMPARISON:  CT abdomen/pelvis 04/27/2021 FINDINGS: Mildly prominent gaseous distension of colon with nonobstructive bowel gas pattern. No abnormal calcifications. Degenerative changes of the spine. IMPRESSION: Nonobstructive bowel gas pattern. Mildly prominent gaseous distension of colon. Electronically Signed   By: Feliberto Harts M.D.   On: 04/29/2023 16:58   [place] ASCITES (ABDOMEN LIMITED)  Result Date: 04/29/2023 CLINICAL DATA:  Ascites Interventional radiology consulted for paracentesis EXAM: LIMITED ABDOMEN ULTRASOUND FOR ASCITES TECHNIQUE: Limited ultrasound survey for ascites was performed in all four abdominal quadrants. COMPARISON:  CT abdomen pelvis 04/28/2023 FINDINGS: Sonographic evaluation of the abdomen demonstrates trace perihepatic  ascites which is insufficient for aspiration. IMPRESSION: Trace perihepatic ascites, insufficient for aspiration. No paracentesis was performed. Electronically Signed   By: Acquanetta Belling M.D.   On: 04/29/2023 09:56             LOS: 4 days     Sunnie Nielsen, DO [facility] 05/02/2023, 6:22 PM    Dictation software may have been used to generate the above note. Typos may occur and escape review in typed/dictated notes. Please contact Dr Lyn Hollingshead directly for clarity if needed.  Staff may message me via secure chat in Epic  but this may not receive an immediate response,  please page me for urgent matters!  If 7PM-7AM, please contact night coverage [email or web address]

## 2023-05-02 NOTE — TOC Progression Note (Signed)
Transition of Care [facility]) - Progression Note    Patient Details  Name: Timothy [name] MRN: [id number] Date of Birth: 1971-01-21  [facility] of [facility]) CM/SW Contact  Catalina Gravel, LCSW Phone Number: 05/02/2023, 1:52 PM  Clinical Narrative:     Pt Auth has 8/15-8/19 ID # [id number]. Multiple facilities accepted pt.  CSW contacted spouse initially to discuss bed offers. Message left.  TOC will identify bed selection prior to DC.  TOC to follow.  Expected Discharge Plan: Skilled Nursing Facility Barriers to Discharge: Continued Medical Work up  Expected Discharge Plan and Services In-house Referral: Clinical Social Work     Living arrangements for the past 2 months: Single Family Home                                       Social Determinants of Health (SDOH) Interventions SDOH Screenings   Food Insecurity: No Food Insecurity (09/18/2022)  Housing: Low Risk  (09/18/2022)  Transportation Needs: No Transportation Needs (09/18/2022)  Utilities: Not At Risk (09/18/2022)  Tobacco Use: High Risk (10/15/2022)    Readmission Risk Interventions     No data to display

## 2023-05-02 NOTE — Progress Notes (Signed)
Pt arrived to room 327 via WC from ICU @ 1630. Pt ambulated with one assist from chair to bed. Pt A&O. Oriented to room and safety procedures, stated understanding. Call bell within reach and bed alarm on. Currently, pt sleeping. Pt had called out on nurse call x2 for pain and anxiety meds but when this nurse entered room, pt was sleeping. Awakened pt to name called, pt denied c/o or needs, and stated did not want to eat supper at this time.

## 2023-05-02 NOTE — Plan of Care (Signed)

## 2023-05-02 NOTE — Progress Notes (Signed)
Subjective: Patient notes mild improvement in his abdominal pain.  Passing flatus.  States he has not had a bowel movement in 6 days.  Objective: Vital signs in last 24 hours: Temp:  [98.3 F (36.8 C)-99.5 F (37.5 C)] 98.5 F (36.9 C) (08/17 1122) Pulse Rate:  [82-92] 84 (08/17 0900) Resp:  [16-25] 16 (08/17 0900) BP: (145-156)/(72-143) 145/72 (08/17 0128) SpO2:  [95 %-97 %] 97 % (08/17 0900) Weight:  [118.6 kg] 118.6 kg (08/17 0434) Last BM Date :  (per pt. last BM was days ago, pt. unsure of date) General:   Alert and oriented, pleasant Head:  Normocephalic and atraumatic. Eyes:  No icterus, sclera clear. Conjuctiva pink.  Abdomen:  Bowel sounds present, soft, non-tender, non-distended. No HSM or hernias noted. No rebound or guarding. No masses appreciated  Msk:  Symmetrical without gross deformities. Normal posture. Extremities:  Without clubbing or edema. Neurologic:  Alert and  oriented x4;  grossly normal neurologically. Skin:  Warm and dry, intact without significant lesions.  Cervical Nodes:  No significant cervical adenopathy. Psych:  Alert and cooperative. Normal mood and affect.  Intake/Output from previous day: 08/16 0701 - 08/17 0700 In: 1780 [P.O.:1780] Out: 1525 [Urine:1525] Intake/Output this shift: Total I/O In: 120 [P.O.:120] Out: 600 [Urine:600]  Lab Results: Recent Labs    05/01/23 0446 05/01/23 1439 05/02/23 0346  WBC 7.5 8.2 6.3  HGB 10.2* 10.1* 9.9*  HCT 30.5* 30.1* 29.6*  PLT 120* 124* 119*   BMET Recent Labs    04/30/23 0522 05/01/23 0446 05/02/23 0346  NA 129* 128* 129*  K 2.9* 2.9* 3.1*  CL 91* 92* 95*  CO2 29 29 26   GLUCOSE 80 85 92  BUN 13 13 11   CREATININE 1.22 1.25* 1.09  CALCIUM 8.1* 8.2* 8.4*   LFT Recent Labs    04/30/23 0522 05/01/23 0446 05/02/23 0346  PROT 6.8 6.5 6.3*  ALBUMIN 2.4* 2.3* 2.2*  AST 152* 147* 143*  ALT 36 34 34  ALKPHOS 207* 191* 177*  BILITOT 5.9* 5.6* 6.9*   PT/INR Recent Labs     05/01/23 0446 05/02/23 0346  LABPROT 17.9* 17.7*  INR 1.5* 1.4*   Hepatitis Panel No results for input(s): "HEPBSAG", "HCVAB", "HEPAIGM", "HEPBIGM" in the last 72 hours.   Studies/Results: CT ABDOMEN WO CONTRAST  Result Date: 05/01/2023 CLINICAL DATA:  Inpatient. Extensive left flank bruising. Generalized abdominal pain. No known injury. EXAM: CT ABDOMEN WITHOUT CONTRAST TECHNIQUE: Multidetector CT imaging of the abdomen was performed following the standard protocol without IV contrast. RADIATION DOSE REDUCTION: This exam was performed according to the departmental dose-optimization program which includes automated exposure control, adjustment of the mA and/or kV according to patient size and/or use of iterative reconstruction technique. COMPARISON:  04/28/2023 CT abdomen/pelvis. FINDINGS: Lower chest: No significant pulmonary nodules or acute consolidative airspace disease. Coronary atherosclerosis. Hepatobiliary: Severe diffuse hepatic steatosis. Relative hypertrophy of caudate lobe with lobulated liver contour, cannot exclude cirrhosis. No discrete liver mass on this noncontrast exam. Somewhat contracted gallbladder with minimal layering hyperdense material, favoring vicarious excretion of contrast from recent IV contrast CT study, without definite gallbladder wall thickening. No biliary ductal dilatation. Pancreas: Normal, with no mass or duct dilation. Spleen: Normal size. No mass. Adrenals/Urinary Tract: Normal adrenals. No renal stones. No hydronephrosis. Simple 3.4 cm anterior interpolar left renal cyst, for which no follow-up imaging is recommended. Stomach/Bowel: Normal non-distended stomach. Visualized small and large bowel is normal caliber, with no bowel wall thickening. Vascular/Lymphatic: Atherosclerotic nonaneurysmal abdominal aorta. Mild  paraumbilical and ventral right abdominal varices. No pathologically enlarged lymph nodes in the abdomen. Other: Small volume perihepatic ascites. No  focal fluid collection. No pneumoperitoneum. Mild-to-moderate anasarca is worsened. No evidence of retroperitoneal hematoma. Musculoskeletal: No aggressive appearing focal osseous lesions. Mild thoracolumbar spondylosis. IMPRESSION: 1. No evidence of retroperitoneal hematoma. 2. Severe diffuse hepatic steatosis. Relative hypertrophy of the caudate lobe with lobulated liver contour, cannot exclude cirrhosis. No discrete liver mass on this noncontrast exam. 3. Small volume perihepatic ascites. Mild paraumbilical and ventral right abdominal varices. Mild-to-moderate anasarca is worsened. 4. Coronary atherosclerosis. 5.  Aortic Atherosclerosis (ICD10-I70.0). Electronically Signed   By: Delbert Phenix M.D.   On: 05/01/2023 19:23    Assessment/Plan:  1.  Decompensated alcohol cirrhosis complicated by portal hypertension, ascites-MELD 3.0 25, MDF 30.7 (not a candidate for steroids)  Liver Doppler without evidence of PVT.  Not enough ascites for paracentesis.  No signs of SBP.  Electrolytes steadily improving.  Resume Lasix 40 mg, spironolactone 100 mg once normal.  I will increase his lactulose to 20 mg twice daily as he is still not had a bowel movement in 6 days.  Continue on Linzess 290 mcg daily.  2 g sodium diet.  Absolute alcohol cessation going forward.  2.  Anemia-hemoglobin stable no evidence of active GI bleeding.  Previous EGD with ulcerative esophagitis.  Continue to monitor hemoglobin, no plans for inpatient endoscopic evaluation.  Needs outpatient colonoscopy for surveillance purposes.  Can consider EGD at the same time to evaluate for varices as well as to screen for Barrett's esophagus.  Continue PPI twice daily.  Hennie Duos. Marletta Lor, D.O. Gastroenterology and Hepatology [facility]   LOS: 4 days    05/02/2023, 11:43 AM

## 2023-05-03 ENCOUNTER — Encounter (HOSPITAL_COMMUNITY): Payer: Self-pay | Admitting: Family Medicine

## 2023-05-03 DIAGNOSIS — K7031 Alcoholic cirrhosis of liver with ascites: Secondary | ICD-10-CM | POA: Diagnosis not present

## 2023-05-03 DIAGNOSIS — R1084 Generalized abdominal pain: Secondary | ICD-10-CM | POA: Diagnosis not present

## 2023-05-03 LAB — COMPREHENSIVE METABOLIC PANEL
ALT: 36 U/L (ref 0–44)
AST: 137 U/L — ABNORMAL HIGH (ref 15–41)
Albumin: 2.3 g/dL — ABNORMAL LOW (ref 3.5–5.0)
Alkaline Phosphatase: 175 U/L — ABNORMAL HIGH (ref 38–126)
Anion gap: 12 (ref 5–15)
BUN: 9 mg/dL (ref 6–20)
CO2: 24 mmol/L (ref 22–32)
Calcium: 8.5 mg/dL — ABNORMAL LOW (ref 8.9–10.3)
Chloride: 92 mmol/L — ABNORMAL LOW (ref 98–111)
Creatinine, Ser: 1.06 mg/dL (ref 0.61–1.24)
GFR, Estimated: >60 mL/min (ref >60)
Glucose, Bld: 77 mg/dL (ref 70–99)
Potassium: 3.5 mmol/L (ref 3.5–5.1)
Sodium: 128 mmol/L — ABNORMAL LOW (ref 135–145)
Total Bilirubin: 7.8 mg/dL — ABNORMAL HIGH (ref 0.3–1.2)
Total Protein: 6.4 g/dL — ABNORMAL LOW (ref 6.5–8.1)

## 2023-05-03 LAB — CBC
HCT: 30.9 % — ABNORMAL LOW (ref 39.0–52.0)
Hemoglobin: 10.4 g/dL — ABNORMAL LOW (ref 13.0–17.0)
MCH: 33 pg (ref 26.0–34.0)
MCHC: 33.7 g/dL (ref 30.0–36.0)
MCV: 98.1 fL (ref 80.0–100.0)
Platelets: 122 10*3/uL — ABNORMAL LOW (ref 150–400)
RBC: 3.15 MIL/uL — ABNORMAL LOW (ref 4.22–5.81)
RDW: 20.7 % — ABNORMAL HIGH (ref 11.5–15.5)
WBC: 6.9 10*3/uL (ref 4.0–10.5)
nRBC: 0 % (ref 0.0–0.2)

## 2023-05-03 LAB — PROTIME-INR
INR: 1.5 — ABNORMAL HIGH (ref 0.8–1.2)
Prothrombin Time: 18 s — ABNORMAL HIGH (ref 11.4–15.2)

## 2023-05-03 LAB — GLUCOSE, CAPILLARY: Glucose-Capillary: 73 mg/dL (ref 70–99)

## 2023-05-03 MED ORDER — GABAPENTIN 300 MG PO CAPS
300.0000 mg | ORAL_CAPSULE | Freq: Three times a day (TID) | ORAL | Status: DC
  Administered 2023-05-03 – 2023-05-06 (×9): 300 mg via ORAL
  Filled 2023-05-03 (×9): qty 1

## 2023-05-03 MED ORDER — PANTOPRAZOLE SODIUM 40 MG PO TBEC
40.0000 mg | DELAYED_RELEASE_TABLET | Freq: Two times a day (BID) | ORAL | Status: DC
  Administered 2023-05-03 – 2023-05-04 (×2): 40 mg via ORAL
  Filled 2023-05-03 (×2): qty 1

## 2023-05-03 MED ORDER — LACTULOSE 10 GM/15ML PO SOLN
10.0000 g | Freq: Two times a day (BID) | ORAL | Status: DC
  Administered 2023-05-03 – 2023-05-05 (×4): 10 g via ORAL
  Filled 2023-05-03 (×4): qty 30

## 2023-05-03 MED ORDER — TRAZODONE HCL 50 MG PO TABS
100.0000 mg | ORAL_TABLET | Freq: Every day | ORAL | Status: DC
  Administered 2023-05-03 – 2023-05-11 (×9): 100 mg via ORAL
  Filled 2023-05-03 (×9): qty 2

## 2023-05-03 MED ORDER — SPIRONOLACTONE 25 MG PO TABS
50.0000 mg | ORAL_TABLET | Freq: Every day | ORAL | Status: DC
  Administered 2023-05-03 – 2023-05-08 (×6): 50 mg via ORAL
  Filled 2023-05-03 (×6): qty 2

## 2023-05-03 MED ORDER — FUROSEMIDE 40 MG PO TABS
40.0000 mg | ORAL_TABLET | Freq: Every day | ORAL | Status: DC
  Administered 2023-05-03 – 2023-05-05 (×3): 40 mg via ORAL
  Filled 2023-05-03 (×4): qty 1

## 2023-05-03 MED ORDER — GABAPENTIN 100 MG PO CAPS
200.0000 mg | ORAL_CAPSULE | Freq: Three times a day (TID) | ORAL | Status: DC
  Administered 2023-05-03: 200 mg via ORAL
  Filled 2023-05-03: qty 2

## 2023-05-03 NOTE — Progress Notes (Addendum)
PROGRESS NOTE    Timothy [name]   QIO:[id number] DOB: 1971/04/30  DOA: 04/28/2023 Date of Service: 05/03/23 PCP: Elfredia Nevins, MD     Brief Narrative / Hospital Course:  Timothy [name] is a 52 year old male with history of HTN, Barrett esophagus, PUD, WPW status post ablation, NICM, alcohol abuse, and liver disease who presents with worsening abdominal distention and pain. 08/13: to ED. Mult electrolyte derangement, low Plt. CT of the abdomen pelvis with IV contrast showed presence of new cirrhotic changes with small volume of ascites. Admitted w/ decompensated cirrhosis/ascites. GI consult. Plan paracentesis. Plan start furosemide/spironolactone when electrolytes are improved 08/14: Ultrasound today reveals trace perihepatic ascites insufficient for tap. Subsequent plain films revealed nonspecific bowel gas pattern with some increase in colonic air. No evidence of obstruction, impaction or free air. Plan liver doppler.  08/15: Liver doppler done. Add spironolactone 100mg  daily/continue lasix 40mg  daily. More aggressive bowel regimen given no signs of obstruction and narcotic use. Will start Linzess daily. Can titrate accordingly.  08/16: Liver Doppler studies with severe steatosis, portal hypertension and patent portal vein. Start lactulose 10 g twice daily. Holding diuretics. Repeated CT to eval L flank bruise and drop Hgb, no hematoma 08/17: still off lasix/spiro, increase lactulose to 20 bid. Stable to transfer out of stepdown. Follow GI recs - consider repeat US / attempt paracentesis?  08/18: BP and K improved, restart lasix/spiro today. Of note, pt reported concern for when he was down for his MRI, he was moved roughly by staff and this maybe caused the bruising, he is not sure if he wants [place] to file a report and will let us know if he decides to.     Consultants:  Gastroenterology   Procedures: None       ASSESSMENT & PLAN:   Principal Problem:   Abdominal  pain Active Problems:   Alcoholic cirrhosis (HCC)   Anxiety state   Barrett's esophagus without dysplasia   Hypomagnesemia   Essential hypertension   Hypokalemia   History of Wolff-Parkinson-White syndrome   GERD (gastroesophageal reflux disease)   Acute CHF (congestive heart failure) (HCC)   ETOH abuse   Dyslipidemia   Elevated LFTs   Alcohol withdrawal/DTs  Decompensated Alcoholic Liver Cirrhosis  Generalized Abdominal pain Progressive alcoholic liver cirrhosis and severe steatosis resulting in portal HTN, ascites, abdominal distention.   Hyponatremia d/t liver disease, EtOH Thrombocytopenia d/t liver disease MELD score of 25, Maddrey DF 30.7-not a candidate for steroid. Liver Doppler without evidence of PVT.  Not enough ascites for paracentesis.  No signs of SBP.  GI following Lasix restarted today at 40 mg po daily Cleda Daub restarted today at 50 mg po daily  Lactulose 20 bid - will reduce back to 10, he's reporting liquid stool  2g sodium diet  Absolute EtOH cessation  Needs outpatient colonoscopy for surveillance purposes. Can consider EGD at the same time to evaluate for varices as well as to screen for Barrett's esophagus.   Constipation  Complicated by opiate dependence, ascites  Lactulose as above  Linzess 290 mcg daily   Anemia stable no evidence of active GI bleeding.  Previous EGD with ulcerative esophagitis.  Continue to monitor hemoglobin no plans for inpatient endoscopic evaluation  Continue PPI twice daily.   Left flank bruising.   No hematoma on CT  Monitor on physical exam Monitor HH/CBC   Hypomagnesemia Hypokalemia Replace as needed Monitor BMP, Mg   Barrett's esophagus without dysplasia GERD Continue PPI twice daily.  Alcohol Abuser/History of DTs Monitoring closely, following CIWA protocol continue daily thiamine, folate replacement  Absolute EtOH cessation    Opioid dependence Chronic Pain  Opioid induced chronic constipation  Pt  reports being on opioids since 1990s and has withdrawal symptoms if not receiving resume his home oxycodone 15 mg Q6h prn, may consider increase dose as wean off IV meds, plan taper this as well outpatient  IV opioids being reduced to wean off laxatives being managed by GI, ssee above   Acute HFmrEF Last echo 12/19/2020:  LVEF 45-50%, mildly decreased LV function moderate LVH, grade 1 diastolic dysfunction impaired relaxation Monitor I's and O's Limit IV fluid resuscitation  History of Wolff-Parkinson-White syndrome Status post ablation, monitoring  Essential hypertension BPs are soft at this time, following closely  Dyslipidemia Holding statins due to elevated LFTs due to alcohol use abuse liver cirrhosis     DVT prophylaxis: SCD d/t low Plt Pertinent IV fluids/nutrition: no continuous IV fluids  Central lines / invasive devices: none  Code Status: FULL CODE ACP documentation reviewed: 05/02/23 none on file   Current Admission Status: inpatient TOC needs / Dispo plan: SNF rehab when stable Barriers to discharge / significant pending items: placement pending, pending clinical improvement / tolerating lasix/spiro              Subjective / Brief ROS:  Patient reports severe pain, about same from yesterday  Denies CP/SOB.  Denies new weakness.  Tolerating diet.  Reports urinary incontinence Reports resolved constipation and now having liquidy stool   Family Communication: none at this time     Objective Findings:  Vitals:   05/02/23 2112 05/03/23 0005 05/03/23 0400 05/03/23 1228  BP: 136/89 112/83 (!) 145/82 129/82  Pulse: 83 80 82 82  Resp:  17 18 20   Temp: 98.3 F (36.8 C) 98.6 F (37 C) 98.6 F (37 C) 98.9 F (37.2 C)  TempSrc: Oral Oral Oral Axillary  SpO2: 100% 96% 100% 95%  Weight:   112.9 kg   Height:        Intake/Output Summary (Last 24 hours) at 05/03/2023 1822 Last data filed at 05/03/2023 1412 Gross per 24 hour  Intake 483 ml  Output  2400 ml  Net -1917 ml   Filed Weights   05/01/23 0500 05/02/23 0434 05/03/23 0400  Weight: 118.6 kg 118.6 kg 112.9 kg    Examination:  Physical Exam Constitutional:      General: He is not in acute distress.    Appearance: He is obese.  Cardiovascular:     Rate and Rhythm: Normal rate and regular rhythm.  Pulmonary:     Effort: Pulmonary effort is normal.     Breath sounds: Normal breath sounds.  Abdominal:     General: Bowel sounds are normal. There is distension.     Palpations: There is fluid wave.     Tenderness: There is abdominal tenderness.  Skin:    General: Skin is warm and dry.  Neurological:     General: No focal deficit present.     Mental Status: He is alert.  Psychiatric:        Mood and Affect: Mood normal.        Behavior: Behavior normal.          Scheduled Medications:   carvedilol  6.25 mg Oral BID WC   Chlorhexidine Gluconate Cloth  6 each Topical Daily   folic acid  1 mg Oral Daily   furosemide  40 mg Oral Daily  gabapentin  300 mg Oral TID   lactulose  10 g Oral BID   linaclotide  290 mcg Oral QAC breakfast   multivitamin with minerals  1 tablet Oral Daily   nicotine  21 mg Transdermal Daily   pantoprazole  40 mg Oral BID   sodium chloride flush  3 mL Intravenous Q12H   spironolactone  50 mg Oral Daily   sucralfate  1 g Oral BID   thiamine  100 mg Oral Daily   Or   thiamine  100 mg Intravenous Daily   traZODone  100 mg Oral QHS    Continuous Infusions:   PRN Medications:  bisacodyl, HYDROmorphone (DILAUDID) injection, hydrOXYzine, ipratropium, levalbuterol, ondansetron **OR** ondansetron (ZOFRAN) IV, mouth rinse, oxyCODONE, polyethylene glycol, senna-docusate, sodium phosphate, zolpidem  Antimicrobials from admission:  Anti-infectives (From admission, onward)    None           Data Reviewed:  I have personally reviewed the following...  CBC: Recent Labs  Lab 04/28/23 0346 04/29/23 0507 04/30/23 0522  05/01/23 0446 05/01/23 1439 05/02/23 0346 05/03/23 0433  WBC 9.2   < > 7.6 7.5 8.2 6.3 6.9  NEUTROABS 6.7  --   --   --   --   --   --   HGB 11.7*   < > 11.0* 10.2* 10.1* 9.9* 10.4*  HCT 33.8*   < > 32.5* 30.5* 30.1* 29.6* 30.9*  MCV 92.1   < > 96.7 98.1 97.7 97.4 98.1  PLT 126*   < > 107* 120* 124* 119* 122*   < > = values in this interval not displayed.   Basic Metabolic Panel: Recent Labs  Lab 04/28/23 0346 04/28/23 0945 04/29/23 0507 04/30/23 0522 05/01/23 0446 05/02/23 0346 05/03/23 0433  NA 132*   < > 128* 129* 128* 129* 128*  K 2.4*   < > 2.5* 2.9* 2.9* 3.1* 3.5  CL 87*   < > 91* 91* 92* 95* 92*  CO2 31   < > 30 29 29 26 24   GLUCOSE 110*   < > 69* 80 85 92 77  BUN 7   < > 10 13 13 11 9   CREATININE 1.19   < > 1.10 1.22 1.25* 1.09 1.06  CALCIUM 8.2*   < > 8.1* 8.1* 8.2* 8.4* 8.5*  MG 1.6*  --  1.8 2.0  --   --   --    < > = values in this interval not displayed.   GFR: Estimated Creatinine Clearance: 103.3 mL/min (by C-G formula based on SCr of 1.06 mg/dL). Liver Function Tests: Recent Labs  Lab 04/29/23 0507 04/30/23 0522 05/01/23 0446 05/02/23 0346 05/03/23 0433  AST 142* 152* 147* 143* 137*  ALT 34 36 34 34 36  ALKPHOS 204* 207* 191* 177* 175*  BILITOT 5.7* 5.9* 5.6* 6.9* 7.8*  PROT 6.6 6.8 6.5 6.3* 6.4*  ALBUMIN 2.3* 2.4* 2.3* 2.2* 2.3*   Recent Labs  Lab 04/28/23 0346  LIPASE 53*   No results for input(s): "AMMONIA" in the last 168 hours. Coagulation Profile: Recent Labs  Lab 04/29/23 0507 04/30/23 0522 05/01/23 0446 05/02/23 0346 05/03/23 0433  INR 1.7* 1.5* 1.5* 1.4* 1.5*   Cardiac Enzymes: No results for input(s): "CKTOTAL", "CKMB", "CKMBINDEX", "TROPONINI" in the last 168 hours. BNP (last 3 results) No results for input(s): "PROBNP" in the last 8760 hours. HbA1C: No results for input(s): "HGBA1C" in the last 72 hours. CBG: Recent Labs  Lab 04/30/23 0729 05/01/23 1158 05/01/23 1606  05/02/23 0720 05/03/23 0803  GLUCAP 74  105* 97 86 73   Lipid Profile: No results for input(s): "CHOL", "HDL", "LDLCALC", "TRIG", "CHOLHDL", "LDLDIRECT" in the last 72 hours. Thyroid Function Tests: No results for input(s): "TSH", "T4TOTAL", "FREET4", "T3FREE", "THYROIDAB" in the last 72 hours. Anemia Panel: No results for input(s): "VITAMINB12", "FOLATE", "FERRITIN", "TIBC", "IRON", "RETICCTPCT" in the last 72 hours. Most Recent Urinalysis On File:     Component Value Date/Time   COLORURINE YELLOW 04/11/2016 1044   APPEARANCEUR CLEAR 04/11/2016 1044   LABSPEC 1.011 04/11/2016 1044   PHURINE 7.5 04/11/2016 1044   GLUCOSEU NEGATIVE 04/11/2016 1044   HGBUR NEGATIVE 04/11/2016 1044   BILIRUBINUR NEGATIVE 04/11/2016 1044   KETONESUR NEGATIVE 04/11/2016 1044   PROTEINUR NEGATIVE 04/11/2016 1044   UROBILINOGEN 0.2 10/29/2014 0937   NITRITE NEGATIVE 04/11/2016 1044   LEUKOCYTESUR NEGATIVE 04/11/2016 1044   Sepsis Labs: @LABRCNTIP (procalcitonin:4,lacticidven:4) Microbiology: Recent Results (from the past 240 hour(s))  MRSA Next Gen by PCR, Nasal     Status: None   Collection Time: 04/29/23 12:25 AM   Specimen: Nasal Mucosa; Nasal Swab  Result Value Ref Range Status   MRSA by PCR Next Gen NOT DETECTED NOT DETECTED Final    Comment: (NOTE) The GeneXpert MRSA Assay (FDA approved for NASAL specimens only), is one component of a comprehensive MRSA colonization surveillance program. It is not intended to diagnose MRSA infection nor to guide or monitor treatment for MRSA infections. Test performance is not FDA approved in patients less than 55 years old. Performed at [facility], [address] [place], [place] [address]       Radiology Studies last 3 days: CT ABDOMEN WO CONTRAST  Result Date: 05/01/2023 CLINICAL DATA:  Inpatient. Extensive left flank bruising. Generalized abdominal pain. No known injury. EXAM: CT ABDOMEN WITHOUT CONTRAST TECHNIQUE: Multidetector CT imaging of the abdomen was performed following the  standard protocol without IV contrast. RADIATION DOSE REDUCTION: This exam was performed according to the departmental dose-optimization program which includes automated exposure control, adjustment of the mA and/or kV according to patient size and/or use of iterative reconstruction technique. COMPARISON:  04/28/2023 CT abdomen/pelvis. FINDINGS: Lower chest: No significant pulmonary nodules or acute consolidative airspace disease. Coronary atherosclerosis. Hepatobiliary: Severe diffuse hepatic steatosis. Relative hypertrophy of caudate lobe with lobulated liver contour, cannot exclude cirrhosis. No discrete liver mass on this noncontrast exam. Somewhat contracted gallbladder with minimal layering hyperdense material, favoring vicarious excretion of contrast from recent IV contrast CT study, without definite gallbladder wall thickening. No biliary ductal dilatation. Pancreas: Normal, with no mass or duct dilation. Spleen: Normal size. No mass. Adrenals/Urinary Tract: Normal adrenals. No renal stones. No hydronephrosis. Simple 3.4 cm anterior interpolar left renal cyst, for which no follow-up imaging is recommended. Stomach/Bowel: Normal non-distended stomach. Visualized small and large bowel is normal caliber, with no bowel wall thickening. Vascular/Lymphatic: Atherosclerotic nonaneurysmal abdominal aorta. Mild paraumbilical and ventral right abdominal varices. No pathologically enlarged lymph nodes in the abdomen. Other: Small volume perihepatic ascites. No focal fluid collection. No pneumoperitoneum. Mild-to-moderate anasarca is worsened. No evidence of retroperitoneal hematoma. Musculoskeletal: No aggressive appearing focal osseous lesions. Mild thoracolumbar spondylosis. IMPRESSION: 1. No evidence of retroperitoneal hematoma. 2. Severe diffuse hepatic steatosis. Relative hypertrophy of the caudate lobe with lobulated liver contour, cannot exclude cirrhosis. No discrete liver mass on this noncontrast exam. 3.  Small volume perihepatic ascites. Mild paraumbilical and ventral right abdominal varices. Mild-to-moderate anasarca is worsened. 4. Coronary atherosclerosis. 5.  Aortic Atherosclerosis (ICD10-I70.0). Electronically Signed  By: Delbert Phenix M.D.   On: 05/01/2023 19:23   US ABDOMEN LIMITED WITH LIVER DOPPLER  Result Date: 04/30/2023 CLINICAL DATA:  52 year old male with history of cirrhosis. EXAM: DUPLEX ULTRASOUND OF LIVER TECHNIQUE: Color and duplex Doppler ultrasound was performed to evaluate the hepatic in-flow and out-flow vessels. COMPARISON:  CT abdomen pelvis from 04/28/2023, 02/01/2013 FINDINGS: Liver: Diffusely increased echogenicity. Glisson's capsule is invisible amongst the portal veins. Mildly nodular contour. No focal lesion, mass or intrahepatic biliary ductal dilatation. Main Portal Vein size: 0.7 cm Portal Vein Velocities Main Prox:  51 cm/sec, hepatofugal Main Mid: 36 cm/sec, hepatofugal Main Dist:  38 cm/sec, hepatofugal Right: Not visualized. Left: 24 cm/sec, hepatofugal Hepatic Vein Velocities Right:  53 cm/sec, antegrade Middle:  36 cm/sec, antegrade Left:  32 cm/sec, antegrade IVC: Not visualized. Hepatic Artery Velocity:  173 cm/sec Splenic Vein Velocity:  28 cm/sec Spleen: 12.7 cm x 12.8 cm x 6.2 cm with a total volume of 529 cm^3 (411 cm^3 is upper limit normal) Portal Vein Occlusion/Thrombus: No Splenic Vein Occlusion/Thrombus: No Ascites: Trace Varices: None IMPRESSION: 1. Diffuse severe echogenicity throughout the hepatic parenchyma as could be seen with severe hepatic steatosis or in the acute setting of hepatitis. 2. Morphologic changes of cirrhosis with portal hypertension is evidence by mild splenomegaly, trace ascites, and hepatofugal flow the visualized portal system which is widely patent. Marliss Coots, MD [facility] Radiology Electronically Signed   By: Marliss Coots M.D.   On: 04/30/2023 15:35             LOS: 5  days     Sunnie Nielsen, DO [facility] 05/03/2023, 6:22 PM    Dictation software may have been used to generate the above note. Typos may occur and escape review in typed/dictated notes. Please contact Dr Lyn Hollingshead directly for clarity if needed.  Staff may message me via secure chat in Epic  but this may not receive an immediate response,  please page me for urgent matters!  If 7PM-7AM, please contact night coverage [email or web address]

## 2023-05-03 NOTE — Progress Notes (Signed)
Patient got up to [facility]. Refused to get to recliner for a short time. Says "I just don't want to today." Explained to patient the benefits of mobility in the hospital but he still declined to get in recliner. Pt. Is back to bed.

## 2023-05-03 NOTE — TOC Progression Note (Signed)
Transition of Care [facility]) - Progression Note    Patient Details  Name: Timothy [name] MRN: [id number] Date of Birth: [birth year]  Transition of Care [facility]) CM/SW Contact  Catalina Gravel, LCSW Phone Number: 05/03/2023, 4:03 PM  Clinical Narrative:    CSW spoke with pt regarding 5 bed offers.  Pt asked if I can tell best one, provided names so pt can look up or decide with family. He would like to think about.  Pt asked could he share a concern, related to discoloration on skin, CSW advised if it is related to a concern I will have to share .   Secure chat to Dr- pt mentioned bruise on one side, not clear on source. Dr. Rosealee Albee to meet CSW with Pt. Discussed his concern.  TOC should follow up with pt on his bed choice Monday Morning. TOC to follow.    Expected Discharge Plan: Skilled Nursing Facility Barriers to Discharge: Continued Medical Work up  Expected Discharge Plan and Services In-house Referral: Clinical Social Work     Living arrangements for the past 2 months: Single Family Home                                       Social Determinants of Health (SDOH) Interventions SDOH Screenings   Food Insecurity: No Food Insecurity (05/03/2023)  Housing: Low Risk  (05/03/2023)  Transportation Needs: No Transportation Needs (05/03/2023)  Utilities: Not At Risk (05/03/2023)  Tobacco Use: High Risk (05/03/2023)    Readmission Risk Interventions     No data to display

## 2023-05-03 NOTE — Progress Notes (Signed)
PHARMACIST - PHYSICIAN COMMUNICATION CONCERNING: IV to Oral Route Change Policy  RECOMMENDATION: This patient is receiving pantoprazole intravenously. Based on criteria approved by the Pharmacy and Therapeutics Committee, the intravenous medication(s) is/are being converted to the equivalent dose of an oral formulation.   DESCRIPTION: These criteria include: The patient is eating (either orally or via tube) and/or has been taking other orally administered medications for at least 24 hours. The patient has no evidence of active gastrointestinal bleeding or malabsorptive GI state (gastrectomy; short bowel; patient on TNA or NPO).  If you have questions about this conversion, please contact the Pharmacy Department:  []   [phone] )  [facility] [x]   [phone] )  [facility] []   [phone] )  [facility] []   [phone] )  [facility]   Will M. Dareen Piano, PharmD Clinical Pharmacist 05/03/2023 4:47 PM

## 2023-05-04 ENCOUNTER — Inpatient Hospital Stay (HOSPITAL_COMMUNITY): Payer: 59

## 2023-05-04 DIAGNOSIS — R109 Unspecified abdominal pain: Secondary | ICD-10-CM

## 2023-05-04 DIAGNOSIS — R1084 Generalized abdominal pain: Secondary | ICD-10-CM | POA: Diagnosis not present

## 2023-05-04 LAB — CBC
HCT: 33.3 % — ABNORMAL LOW (ref 39.0–52.0)
Hemoglobin: 11.1 g/dL — ABNORMAL LOW (ref 13.0–17.0)
MCH: 32.6 pg (ref 26.0–34.0)
MCHC: 33.3 g/dL (ref 30.0–36.0)
MCV: 97.9 fL (ref 80.0–100.0)
Platelets: 142 10*3/uL — ABNORMAL LOW (ref 150–400)
RBC: 3.4 MIL/uL — ABNORMAL LOW (ref 4.22–5.81)
RDW: 20.9 % — ABNORMAL HIGH (ref 11.5–15.5)
WBC: 7 10*3/uL (ref 4.0–10.5)
nRBC: 0 % (ref 0.0–0.2)

## 2023-05-04 LAB — COMPREHENSIVE METABOLIC PANEL
ALT: 40 U/L (ref 0–44)
AST: 137 U/L — ABNORMAL HIGH (ref 15–41)
Albumin: 2.4 g/dL — ABNORMAL LOW (ref 3.5–5.0)
Alkaline Phosphatase: 173 U/L — ABNORMAL HIGH (ref 38–126)
Anion gap: 11 (ref 5–15)
BUN: 9 mg/dL (ref 6–20)
CO2: 24 mmol/L (ref 22–32)
Calcium: 8.6 mg/dL — ABNORMAL LOW (ref 8.9–10.3)
Chloride: 94 mmol/L — ABNORMAL LOW (ref 98–111)
Creatinine, Ser: 1.07 mg/dL (ref 0.61–1.24)
GFR, Estimated: >60 mL/min (ref >60)
Glucose, Bld: 78 mg/dL (ref 70–99)
Potassium: 2.9 mmol/L — ABNORMAL LOW (ref 3.5–5.1)
Sodium: 129 mmol/L — ABNORMAL LOW (ref 135–145)
Total Bilirubin: 7.5 mg/dL — ABNORMAL HIGH (ref 0.3–1.2)
Total Protein: 7.1 g/dL (ref 6.5–8.1)

## 2023-05-04 LAB — PROTIME-INR
INR: 1.4 — ABNORMAL HIGH (ref 0.8–1.2)
Prothrombin Time: 17.2 s — ABNORMAL HIGH (ref 11.4–15.2)

## 2023-05-04 LAB — GLUCOSE, CAPILLARY: Glucose-Capillary: 90 mg/dL (ref 70–99)

## 2023-05-04 MED ORDER — SODIUM CHLORIDE 1 G PO TABS
1.0000 g | ORAL_TABLET | Freq: Two times a day (BID) | ORAL | Status: DC
  Administered 2023-05-04 – 2023-05-10 (×14): 1 g via ORAL
  Filled 2023-05-04 (×14): qty 1

## 2023-05-04 MED ORDER — PANTOPRAZOLE SODIUM 20 MG PO TBEC
20.0000 mg | DELAYED_RELEASE_TABLET | Freq: Two times a day (BID) | ORAL | Status: DC
  Administered 2023-05-04 – 2023-05-05 (×3): 20 mg via ORAL
  Filled 2023-05-04 (×8): qty 1

## 2023-05-04 MED ORDER — ALUM & MAG HYDROXIDE-SIMETH 200-200-20 MG/5ML PO SUSP: 30.0000 mL | Freq: Three times a day (TID) | ORAL | Status: DC

## 2023-05-04 MED ORDER — GUAIFENESIN-DM 100-10 MG/5ML PO SYRP
5.0000 mL | ORAL_SOLUTION | ORAL | Status: DC | PRN
  Administered 2023-05-04 – 2023-05-11 (×5): 5 mL via ORAL
  Filled 2023-05-04 (×5): qty 5

## 2023-05-04 MED ORDER — POTASSIUM CHLORIDE CRYS ER 20 MEQ PO TBCR
40.0000 meq | EXTENDED_RELEASE_TABLET | Freq: Once | ORAL | Status: AC
  Administered 2023-05-04: 40 meq via ORAL
  Filled 2023-05-04: qty 2

## 2023-05-04 MED ORDER — POTASSIUM CHLORIDE CRYS ER 20 MEQ PO TBCR
20.0000 meq | EXTENDED_RELEASE_TABLET | Freq: Once | ORAL | Status: AC
  Administered 2023-05-04: 20 meq via ORAL
  Filled 2023-05-04: qty 1

## 2023-05-04 NOTE — Progress Notes (Signed)
[place] without sufficient fluid for para. Will check abdominal xray.

## 2023-05-04 NOTE — Progress Notes (Signed)
PROGRESS NOTE    LOR LAMERE   BJY:[id number] DOB: 10/12/1970  DOA: 04/28/2023 Date of Service: 05/04/23 PCP: Elfredia Nevins, MD     Brief Narrative / Hospital Course:  Timothy [name] is a 52 year old male with history of HTN, Barrett esophagus, PUD, WPW status post ablation, NICM, alcohol abuse, and liver disease who presents with worsening abdominal distention and pain. 08/13: to ED. Mult electrolyte derangement, low Plt. CT of the abdomen pelvis with IV contrast showed presence of new cirrhotic changes with small volume of ascites. Admitted w/ decompensated cirrhosis/ascites. GI consult. Plan paracentesis. Plan start furosemide/spironolactone when electrolytes are improved 08/14: Ultrasound today reveals trace perihepatic ascites insufficient for tap. Subsequent plain films revealed nonspecific bowel gas pattern with some increase in colonic air. No evidence of obstruction, impaction or free air. Plan liver doppler.  08/15: Liver doppler done. Add spironolactone 100mg  daily/continue lasix 40mg  daily. More aggressive bowel regimen given no signs of obstruction and narcotic use. Will start Linzess daily. Can titrate accordingly.  08/16: Liver Doppler studies with severe steatosis, portal hypertension and patent portal vein. Start lactulose 10 g twice daily. Holding diuretics. Repeated CT to eval L flank bruise and drop Hgb, no hematoma 08/17: still off lasix/spiro, increase lactulose to 20 bid. Stable to transfer out of stepdown. Follow GI recs - consider repeat US / attempt paracentesis?  08/18: BP and K improved, restart lasix/spiro today. Of note, pt reported concern for when he was down for his MRI, he was moved roughly by staff and this maybe caused the bruising,   05/04/2023: Improving, low sodium, potassium..  Sodium potassium repleted   Subjective / Brief ROS:  Seen and examined this morning, still complaining abdominal pain, bilateral hematoma in the flank area, denies  any chest pain denies any shortness of breath complains of generalized weaknesses,       ASSESSMENT & PLAN:   Principal Problem:   Abdominal pain Active Problems:   Alcoholic cirrhosis (HCC)   Anxiety state   Barrett's esophagus without dysplasia   Hypomagnesemia   Essential hypertension   Hypokalemia   History of Wolff-Parkinson-White syndrome   GERD (gastroesophageal reflux disease)   Acute CHF (congestive heart failure) (HCC)   ETOH abuse   Dyslipidemia   Elevated LFTs   Alcohol withdrawal/DTs  Decompensated Alcoholic Liver Cirrhosis  Generalized Abdominal pain Progressive alcoholic liver cirrhosis and severe steatosis resulting in portal HTN, ascites, abdominal distention.   Hyponatremia d/t liver disease, EtOH Thrombocytopenia d/t liver disease MELD score of 25, Maddrey DF 30.7-not a candidate for steroid. Liver Doppler without evidence of PVT.  Not enough ascites for paracentesis.... Using ultrasound and evaluation of ascites  No signs of SBP.  GI following Lasix  40 mg po daily Spiro at 50 mg po daily  Lactulose 20 bid - will reduce back to 10, he's reporting liquid stool  2g sodium diet  Absolute EtOH cessation  Needs outpatient colonoscopy for surveillance purposes. Can consider EGD at the same time to evaluate for varices as well as to screen for Barrett's esophagus.   Constipation  Complicated by opiate dependence, ascites  Lactulose as above  Linzess 290 mcg daily   Anemia stable no evidence of active GI bleeding.  Previous EGD with ulcerative esophagitis.  Continue to monitor hemoglobin no plans for inpatient endoscopic evaluation  Continue PPI twice daily.   Left flank bruising.   No hematoma on CT  Monitor on physical exam Monitor HH/CBC   Hypomagnesemia /Hypokalemia/hyponatremia Replace as needed  Monitor BMP, Mg Added sodium supplements   Barrett's esophagus without dysplasia GERD Continue PPI twice daily.   Alcohol Abuser/History  of DTs Monitoring closely, following CIWA protocol continue daily thiamine, folate replacement  Absolute EtOH cessation    Opioid dependence - Chronic Pain  Opioid induced chronic constipation  Pt reports being on opioids since 1990s and has withdrawal symptoms if not receiving resume his home oxycodone 15 mg Q6h prn, may consider increase dose as wean off IV meds, plan taper this as well outpatient  IV opioids being reduced to wean off laxatives being managed by GI, ssee above   Acute HFmrEF Last echo 12/19/2020:  LVEF 45-50%, mildly decreased LV function moderate LVH, grade 1 diastolic dysfunction impaired relaxation Monitor I's and O's Limit IV fluid resuscitation  History of Wolff-Parkinson-White syndrome Status post ablation, monitoring  Essential hypertension Blood pressure was running low, close slowly improving  Dyslipidemia Holding statins due to elevated LFTs due to alcohol use abuse liver cirrhosis     Consultants:  Gastroenterology   Procedures: None    DVT prophylaxis: SCD d/t low Plt Pertinent IV fluids/nutrition: no continuous IV fluids  Central lines / invasive devices: none  Code Status: FULL CODE ACP documentation reviewed: 05/04/23 none on file   Current Admission Status: inpatient TOC needs / Dispo plan: SNF rehab when stable Barriers to discharge / significant pending items: placement pending, pending clinical improvement / tolerating lasix/spiro       Family Communication: none at this time     Objective Findings:  Vitals:   05/03/23 1228 05/03/23 2035 05/03/23 2131 05/04/23 0555  BP: 129/82 138/79 117/77 (!) 142/92  Pulse: 82 80 78 81  Resp: 20 19 20 18   Temp: 98.9 F (37.2 C) 97.9 F (36.6 C) 98.2 F (36.8 C) 98.7 F (37.1 C)  TempSrc: Axillary  Oral Oral  SpO2: 95% 99% 97% 100%  Weight:    111.2 kg  Height:        Intake/Output Summary (Last 24 hours) at 05/04/2023 1435 Last data filed at 05/04/2023 1306 Gross per 24 hour   Intake 240 ml  Output 2300 ml  Net -2060 ml   Filed Weights   05/02/23 0434 05/03/23 0400 05/04/23 0555  Weight: 118.6 kg 112.9 kg 111.2 kg         General:  AAO x 3,  cooperative, no distress;   HEENT:  Normocephalic, PERRL, otherwise with in Normal limits   Neuro:  CNII-XII intact. , normal motor and sensation, reflexes intact   Lungs:   Clear to auscultation BL, Respirations unlabored,  No wheezes / crackles  Cardio:    S1/S2, RRR, No murmure, No Rubs or Gallops   Abdomen:  Soft, distended, bilateral lower quadrant hematoma, bowel sounds active all four quadrants, no guarding or peritoneal signs.  Muscular  skeletal:  Limited exam -global generalized weaknesses - in bed, able to move all 4 extremities,   2+ pulses,  symmetric, +1 pitting edema  Skin:  Dry, warm to touch, negative for any Rashes,  Wounds: Please see nursing documentation               Scheduled Medications:   carvedilol  6.25 mg Oral BID WC   Chlorhexidine Gluconate Cloth  6 each Topical Daily   folic acid  1 mg Oral Daily   furosemide  40 mg Oral Daily   gabapentin  300 mg Oral TID   lactulose  10 g Oral BID   linaclotide  290  mcg Oral QAC breakfast   multivitamin with minerals  1 tablet Oral Daily   nicotine  21 mg Transdermal Daily   pantoprazole  20 mg Oral BID   sodium chloride flush  3 mL Intravenous Q12H   sodium chloride  1 g Oral BID WC   spironolactone  50 mg Oral Daily   sucralfate  1 g Oral BID   thiamine  100 mg Oral Daily   Or   thiamine  100 mg Intravenous Daily   traZODone  100 mg Oral QHS    Continuous Infusions:   PRN Medications:  bisacodyl, HYDROmorphone (DILAUDID) injection, hydrOXYzine, ipratropium, levalbuterol, ondansetron **OR** ondansetron (ZOFRAN) IV, mouth rinse, oxyCODONE, senna-docusate, sodium phosphate, zolpidem  Antimicrobials from admission:  Anti-infectives (From admission, onward)    None           Data Reviewed:  I have personally  reviewed the following...  CBC: Recent Labs  Lab 04/28/23 0346 04/29/23 0507 05/01/23 0446 05/01/23 1439 05/02/23 0346 05/03/23 0433 05/04/23 0530  WBC 9.2   < > 7.5 8.2 6.3 6.9 7.0  NEUTROABS 6.7  --   --   --   --   --   --   HGB 11.7*   < > 10.2* 10.1* 9.9* 10.4* 11.1*  HCT 33.8*   < > 30.5* 30.1* 29.6* 30.9* 33.3*  MCV 92.1   < > 98.1 97.7 97.4 98.1 97.9  PLT 126*   < > 120* 124* 119* 122* 142*   < > = values in this interval not displayed.   Basic Metabolic Panel: Recent Labs  Lab 04/28/23 0346 04/28/23 0945 04/29/23 0507 04/30/23 0522 05/01/23 0446 05/02/23 0346 05/03/23 0433 05/04/23 0530  NA 132*   < > 128* 129* 128* 129* 128* 129*  K 2.4*   < > 2.5* 2.9* 2.9* 3.1* 3.5 2.9*  CL 87*   < > 91* 91* 92* 95* 92* 94*  CO2 31   < > 30 29 29 26 24 24   GLUCOSE 110*   < > 69* 80 85 92 77 78  BUN 7   < > 10 13 13 11 9 9   CREATININE 1.19   < > 1.10 1.22 1.25* 1.09 1.06 1.07  CALCIUM 8.2*   < > 8.1* 8.1* 8.2* 8.4* 8.5* 8.6*  MG 1.6*  --  1.8 2.0  --   --   --   --    < > = values in this interval not displayed.   GFR: Estimated Creatinine Clearance: 101.6 mL/min (by C-G formula based on SCr of 1.07 mg/dL). Liver Function Tests: Recent Labs  Lab 04/30/23 0522 05/01/23 0446 05/02/23 0346 05/03/23 0433 05/04/23 0530  AST 152* 147* 143* 137* 137*  ALT 36 34 34 36 40  ALKPHOS 207* 191* 177* 175* 173*  BILITOT 5.9* 5.6* 6.9* 7.8* 7.5*  PROT 6.8 6.5 6.3* 6.4* 7.1  ALBUMIN 2.4* 2.3* 2.2* 2.3* 2.4*   Recent Labs  Lab 04/28/23 0346  LIPASE 53*   No results for input(s): "AMMONIA" in the last 168 hours. Coagulation Profile: Recent Labs  Lab 04/30/23 0522 05/01/23 0446 05/02/23 0346 05/03/23 0433 05/04/23 0530  INR 1.5* 1.5* 1.4* 1.5* 1.4*    CBG: Recent Labs  Lab 05/01/23 1158 05/01/23 1606 05/02/23 0720 05/03/23 0803 05/04/23 0755  GLUCAP 105* 97 86 73 90    Most Recent Urinalysis On File:     Component Value Date/Time   COLORURINE YELLOW  04/11/2016 1044   APPEARANCEUR CLEAR  04/11/2016 1044   LABSPEC 1.011 04/11/2016 1044   PHURINE 7.5 04/11/2016 1044   GLUCOSEU NEGATIVE 04/11/2016 1044   HGBUR NEGATIVE 04/11/2016 1044   BILIRUBINUR NEGATIVE 04/11/2016 1044   KETONESUR NEGATIVE 04/11/2016 1044   PROTEINUR NEGATIVE 04/11/2016 1044   UROBILINOGEN 0.2 10/29/2014 0937   NITRITE NEGATIVE 04/11/2016 1044   LEUKOCYTESUR NEGATIVE 04/11/2016 1044   Sepsis Labs: @LABRCNTIP (procalcitonin:4,lacticidven:4) Microbiology: Recent Results (from the past 240 hour(s))  MRSA Next Gen by PCR, Nasal     Status: None   Collection Time: 04/29/23 12:25 AM   Specimen: Nasal Mucosa; Nasal Swab  Result Value Ref Range Status   MRSA by PCR Next Gen NOT DETECTED NOT DETECTED Final    Comment: (NOTE) The GeneXpert MRSA Assay (FDA approved for NASAL specimens only), is one component of a comprehensive MRSA colonization surveillance program. It is not intended to diagnose MRSA infection nor to guide or monitor treatment for MRSA infections. Test performance is not FDA approved in patients less than 72 years old. Performed at [facility], [address], [place], [place] [address]       Radiology Studies last 3 days: [place] ASCITES (ABDOMEN LIMITED)  Result Date: 05/04/2023 CLINICAL DATA:  Abdominal ascites. EXAM: LIMITED ABDOMEN ULTRASOUND FOR ASCITES TECHNIQUE: Limited ultrasound survey for ascites was performed in all four abdominal quadrants. COMPARISON:  CT of the abdomen 05/01/2023 FINDINGS: Small volume ascites is present in the right lower quadrant and at the midline. No significant fluid pockets are present to allow for paracentesis. IMPRESSION: Stable appearance of small volume ascites. Electronically Signed   By: Marin Roberts M.D.   On: 05/04/2023 12:41   CT ABDOMEN WO CONTRAST  Result Date: 05/01/2023 CLINICAL DATA:  Inpatient. Extensive left flank bruising. Generalized abdominal pain. No known injury. EXAM: CT ABDOMEN WITHOUT  CONTRAST TECHNIQUE: Multidetector CT imaging of the abdomen was performed following the standard protocol without IV contrast. RADIATION DOSE REDUCTION: This exam was performed according to the departmental dose-optimization program which includes automated exposure control, adjustment of the mA and/or kV according to patient size and/or use of iterative reconstruction technique. COMPARISON:  04/28/2023 CT abdomen/pelvis. FINDINGS: Lower chest: No significant pulmonary nodules or acute consolidative airspace disease. Coronary atherosclerosis. Hepatobiliary: Severe diffuse hepatic steatosis. Relative hypertrophy of caudate lobe with lobulated liver contour, cannot exclude cirrhosis. No discrete liver mass on this noncontrast exam. Somewhat contracted gallbladder with minimal layering hyperdense material, favoring vicarious excretion of contrast from recent IV contrast CT study, without definite gallbladder wall thickening. No biliary ductal dilatation. Pancreas: Normal, with no mass or duct dilation. Spleen: Normal size. No mass. Adrenals/Urinary Tract: Normal adrenals. No renal stones. No hydronephrosis. Simple 3.4 cm anterior interpolar left renal cyst, for which no follow-up imaging is recommended. Stomach/Bowel: Normal non-distended stomach. Visualized small and large bowel is normal caliber, with no bowel wall thickening. Vascular/Lymphatic: Atherosclerotic nonaneurysmal abdominal aorta. Mild paraumbilical and ventral right abdominal varices. No pathologically enlarged lymph nodes in the abdomen. Other: Small volume perihepatic ascites. No focal fluid collection. No pneumoperitoneum. Mild-to-moderate anasarca is worsened. No evidence of retroperitoneal hematoma. Musculoskeletal: No aggressive appearing focal osseous lesions. Mild thoracolumbar spondylosis. IMPRESSION: 1. No evidence of retroperitoneal hematoma. 2. Severe diffuse hepatic steatosis. Relative hypertrophy of the caudate lobe with lobulated liver  contour, cannot exclude cirrhosis. No discrete liver mass on this noncontrast exam. 3. Small volume perihepatic ascites. Mild paraumbilical and ventral right abdominal varices. Mild-to-moderate anasarca is worsened. 4. Coronary atherosclerosis. 5.  Aortic Atherosclerosis (ICD10-I70.0). Electronically Signed  By: Delbert Phenix M.D.   On: 05/01/2023 19:23       LOS: 6 days     Kendell Bane, MD [facility] 05/04/2023, 2:35 PM   If 7PM-7AM, please contact night coverage [email or web address]

## 2023-05-04 NOTE — TOC Progression Note (Signed)
Transition of Care [facility]) - Progression Note    Patient Details  Name: Timothy [name] MRN: [id number] Date of Birth: [birth year]  [facility] CM/SW Contact  Elliot Gault, LCSW Phone Number: 05/04/2023, 12:11 PM  Clinical Narrative:     TOC following. Spoke with pt to follow up on decision for SNF bed. Pt selects [facility]. MD anticipating dc tomorrow. Updated Jill Side at [facility] who states they can accept pt tomorrow. Will update insurance.  TOC will follow.  Expected Discharge Plan: Skilled Nursing Facility Barriers to Discharge: Continued Medical Work up  Expected Discharge Plan and Services In-house Referral: Clinical Social Work     Living arrangements for the past 2 months: Single Family Home                                       Social Determinants of Health (SDOH) Interventions SDOH Screenings   Food Insecurity: No Food Insecurity (05/03/2023)  Housing: Low Risk  (05/03/2023)  Transportation Needs: No Transportation Needs (05/03/2023)  Utilities: Not At Risk (05/03/2023)  Tobacco Use: High Risk (05/03/2023)    Readmission Risk Interventions     No data to display

## 2023-05-04 NOTE — Plan of Care (Signed)
  Problem: Education: Goal: Knowledge of General Education information will improve Description Including pain rating scale, medication(s)/side effects and non-pharmacologic comfort measures Outcome: Progressing   

## 2023-05-04 NOTE — Progress Notes (Signed)
Gastroenterology Progress Note   Primary Care Physician:  Elfredia Nevins, MD Primary Gastroenterologist:  Dr. Jena Gauss   Patient ID: Timothy [name]; [id number]; 1971-08-21    Subjective   3/4 of french toast today. Had diarrhea yesterday. Last 2 days urgent stools. Lots of gas. No BM today thus far. No mental status changes.    Objective   Vital signs in last 24 hours Temp:  [97.9 F (36.6 C)-98.9 F (37.2 C)] 98.7 F (37.1 C) (08/19 0555) Pulse Rate:  [78-82] 81 (08/19 0555) Resp:  [18-20] 18 (08/19 0555) BP: (117-142)/(77-92) 142/92 (08/19 0555) SpO2:  [95 %-100 %] 100 % (08/19 0555) Weight:  [111.2 kg] 111.2 kg (08/19 0555) Last BM Date : 05/03/23  Physical Exam General:   Alert and oriented, chronically ill-appearing, sallow complexion  Head:  Normocephalic and atraumatic. Eyes:  +scleral icterus   Abdomen:  Bowel sounds present, tight, distended, marked bruising to left flank and left lower abdomen Msk:  Symmetrical without gross deformities. Normal posture. Pulses:  Normal pulses noted. Extremities:  Without  edema. Neurologic:  Alert and  oriented x4   Intake/Output from previous day: 08/18 0701 - 08/19 0700 In: 240 [P.O.:240] Out: 2400 [Urine:2400] Intake/Output this shift: Total I/O In: 240 [P.O.:240] Out: -   Lab Results  Recent Labs    05/02/23 0346 05/03/23 0433 05/04/23 0530  WBC 6.3 6.9 7.0  HGB 9.9* 10.4* 11.1*  HCT 29.6* 30.9* 33.3*  PLT 119* 122* 142*   BMET Recent Labs    05/02/23 0346 05/03/23 0433 05/04/23 0530  NA 129* 128* 129*  K 3.1* 3.5 2.9*  CL 95* 92* 94*  CO2 26 24 24   GLUCOSE 92 77 78  BUN 11 9 9   CREATININE 1.09 1.06 1.07  CALCIUM 8.4* 8.5* 8.6*   LFT Recent Labs    05/02/23 0346 05/03/23 0433 05/04/23 0530  PROT 6.3* 6.4* 7.1  ALBUMIN 2.2* 2.3* 2.4*  AST 143* 137* 137*  ALT 34 36 40  ALKPHOS 177* 175* 173*  BILITOT 6.9* 7.8* 7.5*   PT/INR Recent Labs    05/03/23 0433 05/04/23 0530  LABPROT  18.0* 17.2*  INR 1.5* 1.4*     Studies/Results CT ABDOMEN WO CONTRAST  Result Date: 05/01/2023 CLINICAL DATA:  Inpatient. Extensive left flank bruising. Generalized abdominal pain. No known injury. EXAM: CT ABDOMEN WITHOUT CONTRAST TECHNIQUE: Multidetector CT imaging of the abdomen was performed following the standard protocol without IV contrast. RADIATION DOSE REDUCTION: This exam was performed according to the departmental dose-optimization program which includes automated exposure control, adjustment of the mA and/or kV according to patient size and/or use of iterative reconstruction technique. COMPARISON:  04/28/2023 CT abdomen/pelvis. FINDINGS: Lower chest: No significant pulmonary nodules or acute consolidative airspace disease. Coronary atherosclerosis. Hepatobiliary: Severe diffuse hepatic steatosis. Relative hypertrophy of caudate lobe with lobulated liver contour, cannot exclude cirrhosis. No discrete liver mass on this noncontrast exam. Somewhat contracted gallbladder with minimal layering hyperdense material, favoring vicarious excretion of contrast from recent IV contrast CT study, without definite gallbladder wall thickening. No biliary ductal dilatation. Pancreas: Normal, with no mass or duct dilation. Spleen: Normal size. No mass. Adrenals/Urinary Tract: Normal adrenals. No renal stones. No hydronephrosis. Simple 3.4 cm anterior interpolar left renal cyst, for which no follow-up imaging is recommended. Stomach/Bowel: Normal non-distended stomach. Visualized small and large bowel is normal caliber, with no bowel wall thickening. Vascular/Lymphatic: Atherosclerotic nonaneurysmal abdominal aorta. Mild paraumbilical and ventral right abdominal varices. No pathologically enlarged lymph nodes in the  abdomen. Other: Small volume perihepatic ascites. No focal fluid collection. No pneumoperitoneum. Mild-to-moderate anasarca is worsened. No evidence of retroperitoneal hematoma. Musculoskeletal: No  aggressive appearing focal osseous lesions. Mild thoracolumbar spondylosis. IMPRESSION: 1. No evidence of retroperitoneal hematoma. 2. Severe diffuse hepatic steatosis. Relative hypertrophy of the caudate lobe with lobulated liver contour, cannot exclude cirrhosis. No discrete liver mass on this noncontrast exam. 3. Small volume perihepatic ascites. Mild paraumbilical and ventral right abdominal varices. Mild-to-moderate anasarca is worsened. 4. Coronary atherosclerosis. 5.  Aortic Atherosclerosis (ICD10-I70.0). Electronically Signed   By: Delbert Phenix M.D.   On: 05/01/2023 19:23   US ABDOMEN LIMITED WITH LIVER DOPPLER  Result Date: 04/30/2023 CLINICAL DATA:  52 year old male with history of cirrhosis. EXAM: DUPLEX ULTRASOUND OF LIVER TECHNIQUE: Color and duplex Doppler ultrasound was performed to evaluate the hepatic in-flow and out-flow vessels. COMPARISON:  CT abdomen pelvis from 04/28/2023, 02/01/2013 FINDINGS: Liver: Diffusely increased echogenicity. Glisson's capsule is invisible amongst the portal veins. Mildly nodular contour. No focal lesion, mass or intrahepatic biliary ductal dilatation. Main Portal Vein size: 0.7 cm Portal Vein Velocities Main Prox:  51 cm/sec, hepatofugal Main Mid: 36 cm/sec, hepatofugal Main Dist:  38 cm/sec, hepatofugal Right: Not visualized. Left: 24 cm/sec, hepatofugal Hepatic Vein Velocities Right:  53 cm/sec, antegrade Middle:  36 cm/sec, antegrade Left:  32 cm/sec, antegrade IVC: Not visualized. Hepatic Artery Velocity:  173 cm/sec Splenic Vein Velocity:  28 cm/sec Spleen: 12.7 cm x 12.8 cm x 6.2 cm with a total volume of 529 cm^3 (411 cm^3 is upper limit normal) Portal Vein Occlusion/Thrombus: No Splenic Vein Occlusion/Thrombus: No Ascites: Trace Varices: None IMPRESSION: 1. Diffuse severe echogenicity throughout the hepatic parenchyma as could be seen with severe hepatic steatosis or in the acute setting of hepatitis. 2. Morphologic changes of cirrhosis with portal  hypertension is evidence by mild splenomegaly, trace ascites, and hepatofugal flow the visualized portal system which is widely patent. Marliss Coots, MD [facility] Electronically Signed   By: Marliss Coots M.D.   On: 04/30/2023 15:35   DG Abd 1 View  Result Date: 04/29/2023 CLINICAL DATA:  Abdominal distension. EXAM: ABDOMEN - 1 VIEW COMPARISON:  CT abdomen/pelvis 04/27/2021 FINDINGS: Mildly prominent gaseous distension of colon with nonobstructive bowel gas pattern. No abnormal calcifications. Degenerative changes of the spine. IMPRESSION: Nonobstructive bowel gas pattern. Mildly prominent gaseous distension of colon. Electronically Signed   By: Feliberto Harts M.D.   On: 04/29/2023 16:58   [place] ASCITES (ABDOMEN LIMITED)  Result Date: 04/29/2023 CLINICAL DATA:  Ascites Interventional radiology consulted for paracentesis EXAM: LIMITED ABDOMEN ULTRASOUND FOR ASCITES TECHNIQUE: Limited ultrasound survey for ascites was performed in all four abdominal quadrants. COMPARISON:  CT abdomen pelvis 04/28/2023 FINDINGS: Sonographic evaluation of the abdomen demonstrates trace perihepatic ascites which is insufficient for aspiration. IMPRESSION: Trace perihepatic ascites, insufficient for aspiration. No paracentesis was performed. Electronically Signed   By: Acquanetta Belling M.D.   On: 04/29/2023 09:56   CT ABDOMEN PELVIS W CONTRAST  Result Date: 04/28/2023 CLINICAL DATA:  Bowel obstruction suspected. Acute, nonlocalized abdominal pain. EXAM: CT ABDOMEN AND PELVIS WITH CONTRAST TECHNIQUE: Multidetector CT imaging of the abdomen and pelvis was performed using the standard protocol following bolus administration of intravenous contrast. RADIATION DOSE REDUCTION: This exam was performed according to the departmental dose-optimization program which includes automated exposure control, adjustment of the mA and/or kV according to patient size and/or use of iterative  reconstruction technique. CONTRAST:  OMNIPAQUE IOHEXOL 300 MG/ML  SOLN  COMPARISON:  09/24/2021 FINDINGS: Lower chest:  Coronary atherosclerosis. Hepatobiliary: Severe hepatic steatosis. Lobulated liver surface with newly enlarged caudate and further widened fissures.No evidence of biliary obstruction or stone. Pancreas: Unremarkable. Spleen: Unremarkable. Adrenals/Urinary Tract: Negative adrenals. No hydronephrosis or stone. Left renal hilar and cortical cysts with simple appearance measuring up to 3.2 cm at the renal hilum. Unremarkable bladder. Stomach/Bowel: No obstruction. No appendicitis other visible bowel inflammation. Vascular/Lymphatic: No acute vascular abnormality. Scattered atheromatous calcification. No mass or adenopathy. Reproductive:No pathologic findings. Other: New small volume ascites distributed throughout the abdomen including at the small umbilical hernia. Musculoskeletal: No acute abnormalities. Generalized degeneration with mild scoliosis. IMPRESSION: 1. Severe hepatic steatosis with progressive findings of cirrhosis since 2023. Small volume ascites is newly seen. 2. Extensive atherosclerosis. Electronically Signed   By: Tiburcio Pea M.D.   On: 04/28/2023 05:30    Assessment  52 y.o. male with new findings of decompensated cirrhosis felt secondary to alcohol, anemia, and electrolyte derangements.   MELD 3.0 is 24 today. Concern for tense ascites. [place] para without sufficient fluid. Liver doppler without evidence of PVT. Will order abdominal xray.    Concern for encephalopathy and started on lactulose in addition to Linzess. He had multiple loose stools yesterday but none today. Abdomen is distended. As [place] para without ascites, will pursue abdominal xray.   Anemia: Last EGD 09/18/2022 for hematemesis with grade D reflux esophagitis with ulceration, recently bleeding, gastritis biopsied.  No esophageal or gastric varices.  Recommended repeat EGD in 8 weeks with Dr. Jena Gauss.   Pathology with gastric antral and oxyntic mucosa with no specific histopathologic changes, H. pylori negative. Will need as outpatient. Last colonoscopy 05/12/2013: Hemorrhoids, colonic and rectal polyps removed.  Pathology with tubular adenomas.  Recommended 5-year surveillance. Will need as outpatient unless evidence for overt Gi bleeding. Hgb remaining stable.   Abdominal bruising to left flank: reportedly occurred during transfer for CT on 8/13. CT 8/16 without evidence of retroperitoneal hematoma.   Abnormal LFTs: DF less than 32. Tbili slightly decreased today. Expect this to lag behind. Alk Phos slowly improving, AST overall stable. Suspect sodium will remain on lower end in light of cirrhosis. Currently on lasix 40 mg oral and aldactone 50 mg oral daily. May need to titrate lasix down to 20 mg tomorrow morning if persistent hypokalemia.    Plan / Recommendations  [place] without sufficient fluid for para: check abdominal xray Outpatient colonoscopy/EGD BMP in am; titrate diuretic therapy as appropriate    LOS: 6 days    05/04/2023, 11:00 AM  Gelene Mink, PhD, ANP-BC [facility]astroenterology

## 2023-05-04 NOTE — Plan of Care (Signed)
  Problem: Health Behavior/Discharge Planning: Goal: Ability to manage health-related needs will improve Outcome: Progressing   

## 2023-05-04 NOTE — Care Management Important Message (Signed)
Important Message  Patient Details  Name: Timothy [name] MRN: [id number] Date of Birth: [birth year]   Medicare Important Message Given:  Yes     Corey Harold 05/04/2023, 4:26 PM

## 2023-05-05 ENCOUNTER — Inpatient Hospital Stay (HOSPITAL_COMMUNITY): Payer: 59

## 2023-05-05 DIAGNOSIS — K7031 Alcoholic cirrhosis of liver with ascites: Secondary | ICD-10-CM | POA: Diagnosis not present

## 2023-05-05 DIAGNOSIS — R1084 Generalized abdominal pain: Secondary | ICD-10-CM | POA: Diagnosis not present

## 2023-05-05 LAB — COMPREHENSIVE METABOLIC PANEL
ALT: 37 U/L (ref 0–44)
AST: 114 U/L — ABNORMAL HIGH (ref 15–41)
Albumin: 2.2 g/dL — ABNORMAL LOW (ref 3.5–5.0)
Alkaline Phosphatase: 142 U/L — ABNORMAL HIGH (ref 38–126)
Anion gap: 11 (ref 5–15)
BUN: 10 mg/dL (ref 6–20)
CO2: 22 mmol/L (ref 22–32)
Calcium: 8.3 mg/dL — ABNORMAL LOW (ref 8.9–10.3)
Chloride: 95 mmol/L — ABNORMAL LOW (ref 98–111)
Creatinine, Ser: 1.15 mg/dL (ref 0.61–1.24)
GFR, Estimated: >60 mL/min (ref >60)
Glucose, Bld: 94 mg/dL (ref 70–99)
Potassium: 3 mmol/L — ABNORMAL LOW (ref 3.5–5.1)
Sodium: 128 mmol/L — ABNORMAL LOW (ref 135–145)
Total Bilirubin: 5.9 mg/dL — ABNORMAL HIGH (ref 0.3–1.2)
Total Protein: 6.3 g/dL — ABNORMAL LOW (ref 6.5–8.1)

## 2023-05-05 LAB — GLUCOSE, CAPILLARY: Glucose-Capillary: 82 mg/dL (ref 70–99)

## 2023-05-05 LAB — LIPASE, BLOOD: Lipase: 76 U/L — ABNORMAL HIGH (ref 11–51)

## 2023-05-05 LAB — PROTIME-INR
INR: 1.5 — ABNORMAL HIGH (ref 0.8–1.2)
Prothrombin Time: 18.1 s — ABNORMAL HIGH (ref 11.4–15.2)

## 2023-05-05 MED ORDER — SENNOSIDES-DOCUSATE SODIUM 8.6-50 MG PO TABS
1.0000 | ORAL_TABLET | Freq: Every day | ORAL | Status: DC
  Administered 2023-05-05 – 2023-05-07 (×3): 1 via ORAL
  Filled 2023-05-05 (×3): qty 1

## 2023-05-05 MED ORDER — IOHEXOL 300 MG/ML  SOLN
100.0000 mL | Freq: Once | INTRAMUSCULAR | Status: AC | PRN
  Administered 2023-05-05: 100 mL via INTRAVENOUS

## 2023-05-05 MED ORDER — HYDROMORPHONE HCL 1 MG/ML IJ SOLN
0.5000 mg | INTRAMUSCULAR | Status: DC | PRN
  Administered 2023-05-05 – 2023-05-12 (×28): 0.5 mg via INTRAVENOUS
  Filled 2023-05-05 (×30): qty 0.5

## 2023-05-05 MED ORDER — ALBUMIN HUMAN 25 % IV SOLN
50.0000 g | Freq: Three times a day (TID) | INTRAVENOUS | Status: DC
  Administered 2023-05-05 – 2023-05-07 (×5): 50 g via INTRAVENOUS
  Filled 2023-05-05 (×5): qty 200

## 2023-05-05 MED ORDER — SODIUM CHLORIDE 0.9 % IV SOLN: INTRAVENOUS | Status: DC | PRN

## 2023-05-05 MED ORDER — POTASSIUM CHLORIDE CRYS ER 20 MEQ PO TBCR
40.0000 meq | EXTENDED_RELEASE_TABLET | Freq: Once | ORAL | Status: AC
  Administered 2023-05-05: 40 meq via ORAL
  Filled 2023-05-05: qty 2

## 2023-05-05 MED ORDER — LACTULOSE 10 GM/15ML PO SOLN
30.0000 g | Freq: Two times a day (BID) | ORAL | Status: DC
  Administered 2023-05-05 – 2023-05-11 (×13): 30 g via ORAL
  Filled 2023-05-05 (×14): qty 60

## 2023-05-05 NOTE — Progress Notes (Signed)
Physical Therapy Treatment Patient Details Name: Timothy [name] MRN: [id number] DOB: [birth year] Today's Date: 05/05/2023   History of Present Illness Timothy [name] is a 52 year old male with history of HTN,  Barrett esophagus, PUD, WPW status post ablation, NICM, alcohol abuse, and liver disease who presents with worsening abdominal distention and pain.  For past 2-3 days.  Reporting his bellybutton is popping out now.  Voiding and nonbilious nonbloody emesis difficulty having bowel movements.    PT Comments  Pt supine in bed and need for assistance to [facility] for bowel movement.  Pt independent with bed mobility and need for 1 HHA for SPT to [facility].  Pt stable standing for short duration to wipe with no LOB.  Pt limited by pain and fatigue.  Requested to return to bed following restroom break due to pain and fatigue.  Pt pre-medicated prior session, unsure if partial reason for fatigue.  EOS pt left in bed with call bell within reach and messaged NT to address catheter leak.   If plan is discharge home, recommend the following:     Can travel by private vehicle        Equipment Recommendations       Recommendations for Other Services       Precautions / Restrictions Precautions Precautions: Fall Restrictions Weight Bearing Restrictions: No     Mobility  Bed Mobility Overal bed mobility: Modified Independent       Supine to sit: Supervision     General bed mobility comments: independent bed mobility wiht normal speed movements, need for bowel movement    Transfers Overall transfer level: Modified independent   Transfers: Sit to/from Stand Sit to Stand: Min assist, Contact guard assist Stand pivot transfers: Contact guard assist         General transfer comment: Stable on feet with UE support for short duration prior need to sit    Ambulation/Gait               General Gait Details: Limited by pain, wishes to return to bed due to abdominal pain   Stairs              Wheelchair Mobility     Tilt Bed    Modified Rankin (Stroke Patients Only)       Balance                                            Cognition Arousal: Alert Behavior During Therapy: WFL for tasks assessed/performed, Anxious Overall Cognitive Status: Within Functional Limits for tasks assessed                                          Exercises      General Comments        Pertinent Vitals/Pain Pain Assessment Pain Assessment: No/denies pain Pain Score: 10-Worst pain ever Pain Location: abdominal area, bruising on Lt trunk Pain Descriptors / Indicators: Grimacing, Guarding Pain Intervention(s): Limited activity within patient's tolerance, Repositioned, Premedicated before session    Home Living                          Prior Function            PT Goals (current goals  can now be found in the care plan section)      Frequency           PT Plan      Co-evaluation              AM-PAC PT "6 Clicks" Mobility   Outcome Measure  Help needed turning from your back to your side while in a flat bed without using bedrails?: A Little Help needed moving from lying on your back to sitting on the side of a flat bed without using bedrails?: A Little Help needed moving to and from a bed to a chair (including a wheelchair)?: A Little Help needed standing up from a chair using your arms (e.g., wheelchair or bedside chair)?: A Lot Help needed to walk in hospital room?: A Lot Help needed climbing 3-5 steps with a railing? : A Lot 6 Click Score: 15    End of Session Equipment Utilized During Treatment: Gait belt Activity Tolerance: Patient limited by pain;Patient limited by fatigue Patient left: with call bell/phone within reach;in chair;with nursing/sitter in room Nurse Communication: Mobility status PT Visit Diagnosis: Unsteadiness on feet (R26.81);Other abnormalities of gait and mobility  (R26.89);Muscle weakness (generalized) (M62.81)     Time: 1610-9604 PT Time Calculation (min) (ACUTE ONLY): 15 min  Charges:    $Therapeutic Activity: 8-22 mins PT General Charges $$ ACUTE PT VISIT: 1 Visit                     Becky Sax, LPTA/CLT; CBIS [phone]  Timothy [name] 05/05/2023, 3:55 PM

## 2023-05-05 NOTE — Progress Notes (Signed)
PROGRESS NOTE    Timothy [name]   JJO:[id number] DOB: 03-13-1971  DOA: 04/28/2023 Date of Service: 05/05/23 PCP: Elfredia Nevins, MD     Brief Narrative / Hospital Course:  Timothy [name] is a 52 year old male with history of HTN, Barrett esophagus, PUD, WPW status post ablation, NICM, alcohol abuse, and liver disease who presents with worsening abdominal distention and pain. 08/13: to ED. Mult electrolyte derangement, low Plt. CT of the abdomen pelvis with IV contrast showed presence of new cirrhotic changes with small volume of ascites. Admitted w/ decompensated cirrhosis/ascites. GI consult. Plan paracentesis. Plan start furosemide/spironolactone when electrolytes are improved 08/14: Ultrasound today reveals trace perihepatic ascites insufficient for tap. Subsequent plain films revealed nonspecific bowel gas pattern with some increase in colonic air. No evidence of obstruction, impaction or free air. Plan liver doppler.  08/15: Liver doppler done. Add spironolactone 100mg  daily/continue lasix 40mg  daily. More aggressive bowel regimen given no signs of obstruction and narcotic use. Will start Linzess daily. Can titrate accordingly.  08/16: Liver Doppler studies with severe steatosis, portal hypertension and patent portal vein. Start lactulose 10 g twice daily. Holding diuretics. Repeated CT to eval L flank bruise and drop Hgb, no hematoma 08/17: still off lasix/spiro, increase lactulose to 20 bid. Stable to transfer out of stepdown. Follow GI recs - consider repeat US / attempt paracentesis?  08/18: BP and K improved, restart lasix/spiro today. Of note, pt reported concern for when he was down for his MRI, he was moved roughly by staff and this maybe caused the bruising,   05/04/2023: Improving, low sodium, potassium..  Sodium potassium repleted 05/05/2023: Reporting of increased abdominal pain, distention, feeling weak, constipated,   Subjective / Brief ROS:  The patient was seen  and examined this morning, stating he is not feeling too well, stating he feels his abdomen is more distended, his bowel movements has not been adequate        ASSESSMENT & PLAN:   Principal Problem:   Abdominal pain Active Problems:   Alcoholic cirrhosis (HCC)   Anxiety state   Barrett's esophagus without dysplasia   Hypomagnesemia   Essential hypertension   Hypokalemia   History of Wolff-Parkinson-White syndrome   GERD (gastroesophageal reflux disease)   Acute CHF (congestive heart failure) (HCC)   ETOH abuse   Dyslipidemia   Elevated LFTs   Alcohol withdrawal/DTs  Decompensated Alcoholic Liver Cirrhosis  / Generalized Abdominal pain Progressive alcoholic liver cirrhosis and severe steatosis resulting in portal HTN, ascites, abdominal distention.   Hyponatremia d/t liver disease, EtOH Thrombocytopenia d/t liver disease  MELD score of 25, Maddrey DF 30.7-not a candidate for steroid. Liver Doppler without evidence of PVT.  Not enough ascites for paracentesis.... Using ultrasound and evaluation of ascites  No signs of SBP.  GI following -obtaining another CT scan of abdomen today Lasix  40 mg po daily Spiro at 50 mg po daily  Lactulose 20 bid - will reduce back to 10, he's reporting liquid stool  2g sodium diet  Absolute EtOH cessation  Needs outpatient colonoscopy for surveillance purposes. Can consider EGD at the same time to evaluate for varices as well as to screen for Barrett's esophagus.  Obtaining CT scan of abdomen today per GI  Constipation  Complicated by opiate dependence, ascites  Lactulose as above  Linzess 290 mcg daily   Anemia stable no evidence of active GI bleeding.  Previous EGD with ulcerative esophagitis.  Continue to monitor hemoglobin no plans for inpatient endoscopic evaluation  Continue PPI twice daily.   Left flank bruising.   No hematoma on CT  Monitor on physical exam Monitor HH/CBC   Hypomagnesemia  /Hypokalemia/hyponatremia Replace as needed Monitor BMP, Mg Added sodium supplements   Barrett's esophagus without dysplasia GERD Continue PPI twice daily.   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 echo 12/19/2020:  LVEF 45-50%, mildly decreased LV function moderate LVH, grade 1 diastolic dysfunction impaired relaxation Monitor I's and O's Limit IV fluid resuscitation  History of Wolff-Parkinson-White syndrome Status post ablation, monitoring  Essential hypertension Blood pressure was running low, close slowly improving  Dyslipidemia Holding statins due to elevated LFTs due to alcohol use abuse liver cirrhosis     Consultants:  Gastroenterology   Procedures: None    DVT prophylaxis: SCD d/t low Plt Pertinent IV fluids/nutrition: no continuous IV fluids  Central lines / invasive devices: none  Code Status: FULL CODE ACP documentation reviewed: 05/04/23 none on file   Current Admission Status: inpatient TOC needs / Dispo plan: SNF rehab when stable Barriers to discharge / significant pending items: placement pending, pending clinical improvement / tolerating lasix/spiro       Family Communication: none at this time     Objective Findings:  Vitals:   05/04/23 1300 05/04/23 2108 05/05/23 0615 05/05/23 0859  BP: 106/64 105/68 103/83 125/82  Pulse: 78 75 78 81  Resp: 19 18 16    Temp: 98.9 F (37.2 C) 97.7 F (36.5 C) 99.4 F (37.4 C)   TempSrc: Oral Oral Oral   SpO2: 99%  98% 99%   Weight:   110.8 kg   Height:        Intake/Output Summary (Last 24 hours) at 05/05/2023 1153 Last data filed at 05/05/2023 0900 Gross per 24 hour  Intake 480 ml  Output 2250 ml  Net -1770 ml   Filed Weights   05/03/23 0400 05/04/23 0555 05/05/23 0615  Weight: 112.9 kg 111.2 kg 110.8 kg      General:  AAO x 3,  cooperative, no distress;   HEENT:  Normocephalic, PERRL, otherwise with in Normal limits   Neuro:  CNII-XII intact. , normal motor and sensation, reflexes intact   Lungs:   Clear to auscultation BL, Respirations unlabored,  No wheezes / crackles  Cardio:    S1/S2, RRR, No murmure, No Rubs or Gallops   Abdomen:  Soft, diffusely tender, distended - bowel sounds active all four quadrants, no guarding or peritoneal signs.  Muscular  skeletal:  Limited exam -global generalized weaknesses - in bed, able to move all 4 extremities,   2+ pulses,  symmetric, No pitting edema Bilateral flank hematoma noted  Skin:  Dry, warm to touch, negative for any Rashes,  Wounds: Please see nursing documentation            Scheduled Medications:   carvedilol  6.25 mg Oral BID WC   folic acid  1 mg Oral Daily   furosemide  40 mg Oral Daily   gabapentin  300 mg Oral TID  lactulose  10 g Oral BID   linaclotide  290 mcg Oral QAC breakfast   multivitamin with minerals  1 tablet Oral Daily   nicotine  21 mg Transdermal Daily   pantoprazole  20 mg Oral BID   sodium chloride flush  3 mL Intravenous Q12H   sodium chloride  1 g Oral BID WC   spironolactone  50 mg Oral Daily   sucralfate  1 g Oral BID   thiamine  100 mg Oral Daily   Or   thiamine  100 mg Intravenous Daily   traZODone  100 mg Oral QHS    Continuous Infusions:   PRN Medications:  bisacodyl, guaiFENesin-dextromethorphan, HYDROmorphone (DILAUDID) injection, hydrOXYzine, ipratropium, levalbuterol, ondansetron **OR** ondansetron (ZOFRAN) IV, mouth rinse, oxyCODONE, senna-docusate, sodium phosphate,  zolpidem  Antimicrobials from admission:  Anti-infectives (From admission, onward)    None           Data Reviewed:  I have personally reviewed the following...  CBC: Recent Labs  Lab 05/01/23 0446 05/01/23 1439 05/02/23 0346 05/03/23 0433 05/04/23 0530  WBC 7.5 8.2 6.3 6.9 7.0  HGB 10.2* 10.1* 9.9* 10.4* 11.1*  HCT 30.5* 30.1* 29.6* 30.9* 33.3*  MCV 98.1 97.7 97.4 98.1 97.9  PLT 120* 124* 119* 122* 142*   Basic Metabolic Panel: Recent Labs  Lab 04/29/23 0507 04/30/23 0522 05/01/23 0446 05/02/23 0346 05/03/23 0433 05/04/23 0530 05/05/23 0439  NA 128* 129* 128* 129* 128* 129* 128*  K 2.5* 2.9* 2.9* 3.1* 3.5 2.9* 3.0*  CL 91* 91* 92* 95* 92* 94* 95*  CO2 30 29 29 26 24 24 22   GLUCOSE 69* 80 85 92 77 78 94  BUN 10 13 13 11 9 9 10   CREATININE 1.10 1.22 1.25* 1.09 1.06 1.07 1.15  CALCIUM 8.1* 8.1* 8.2* 8.4* 8.5* 8.6* 8.3*  MG 1.8 2.0  --   --   --   --   --    GFR: Estimated Creatinine Clearance: 94.4 mL/min (by C-G formula based on SCr of 1.15 mg/dL). Liver Function Tests: Recent Labs  Lab 05/01/23 0446 05/02/23 0346 05/03/23 0433 05/04/23 0530 05/05/23 0439  AST 147* 143* 137* 137* 114*  ALT 34 34 36 40 37  ALKPHOS 191* 177* 175* 173* 142*  BILITOT 5.6* 6.9* 7.8* 7.5* 5.9*  PROT 6.5 6.3* 6.4* 7.1 6.3*  ALBUMIN 2.3* 2.2* 2.3* 2.4* 2.2*   No results for input(s): "LIPASE", "AMYLASE" in the last 168 hours.  No results for input(s): "AMMONIA" in the last 168 hours. Coagulation Profile: Recent Labs  Lab 05/01/23 0446 05/02/23 0346 05/03/23 0433 05/04/23 0530 05/05/23 0439  INR 1.5* 1.4* 1.5* 1.4* 1.5*    CBG: Recent Labs  Lab 05/01/23 1606 05/02/23 0720 05/03/23 0803 05/04/23 0755 05/05/23 0725  GLUCAP 97 86 73 90 82    Most Recent Urinalysis On File:     Component Value Date/Time   COLORURINE YELLOW 04/11/2016 1044   APPEARANCEUR CLEAR 04/11/2016 1044   LABSPEC 1.011 04/11/2016 1044   PHURINE 7.5 04/11/2016 1044   GLUCOSEU  NEGATIVE 04/11/2016 1044   HGBUR NEGATIVE 04/11/2016 1044   BILIRUBINUR NEGATIVE 04/11/2016 1044   KETONESUR NEGATIVE 04/11/2016 1044   PROTEINUR NEGATIVE 04/11/2016 1044   UROBILINOGEN 0.2 10/29/2014 0937   NITRITE NEGATIVE 04/11/2016 1044   LEUKOCYTESUR NEGATIVE 04/11/2016 1044   Sepsis Labs: @LABRCNTIP (procalcitonin:4,lacticidven:4) Microbiology: Recent Results (from the past 240 hour(s))  MRSA Next Gen by PCR, Nasal     Status: None   Collection Time: 04/29/23 12:25  AM   Specimen: Nasal Mucosa; Nasal Swab  Result Value Ref Range Status   MRSA by PCR Next Gen NOT DETECTED NOT DETECTED Final    Comment: (NOTE) The GeneXpert MRSA Assay (FDA approved for NASAL specimens only), is one component of a comprehensive MRSA colonization surveillance program. It is not intended to diagnose MRSA infection nor to guide or monitor treatment for MRSA infections. Test performance is not FDA approved in patients less than 19 years old. Performed at [facility], [address], [place], [place] [address]       Radiology Studies last 3 days: DG Abd 2 Views  Result Date: 05/04/2023 CLINICAL DATA:  Abdominal distension EXAM: ABDOMEN - 2 VIEW COMPARISON:  05/01/2023, 04/29/2023 FINDINGS: Progressive gaseous distension of both large and small bowel loops throughout the abdomen. No gross free intraperitoneal air. IMPRESSION: Progressive gaseous distension of both large and small bowel loops throughout the abdomen. Pattern favors ileus. Obstruction not excluded. Electronically Signed   By: Duanne Guess D.O.   On: 05/04/2023 18:54   [place] ASCITES (ABDOMEN LIMITED)  Result Date: 05/04/2023 CLINICAL DATA:  Abdominal ascites. EXAM: LIMITED ABDOMEN ULTRASOUND FOR ASCITES TECHNIQUE: Limited ultrasound survey for ascites was performed in all four abdominal quadrants. COMPARISON:  CT of the abdomen 05/01/2023 FINDINGS: Small volume ascites is present in the right lower quadrant and at the midline. No  significant fluid pockets are present to allow for paracentesis. IMPRESSION: Stable appearance of small volume ascites. Electronically Signed   By: Marin Roberts M.D.   On: 05/04/2023 12:41   CT ABDOMEN WO CONTRAST  Result Date: 05/01/2023 CLINICAL DATA:  Inpatient. Extensive left flank bruising. Generalized abdominal pain. No known injury. EXAM: CT ABDOMEN WITHOUT CONTRAST TECHNIQUE: Multidetector CT imaging of the abdomen was performed following the standard protocol without IV contrast. RADIATION DOSE REDUCTION: This exam was performed according to the departmental dose-optimization program which includes automated exposure control, adjustment of the mA and/or kV according to patient size and/or use of iterative reconstruction technique. COMPARISON:  04/28/2023 CT abdomen/pelvis. FINDINGS: Lower chest: No significant pulmonary nodules or acute consolidative airspace disease. Coronary atherosclerosis. Hepatobiliary: Severe diffuse hepatic steatosis. Relative hypertrophy of caudate lobe with lobulated liver contour, cannot exclude cirrhosis. No discrete liver mass on this noncontrast exam. Somewhat contracted gallbladder with minimal layering hyperdense material, favoring vicarious excretion of contrast from recent IV contrast CT study, without definite gallbladder wall thickening. No biliary ductal dilatation. Pancreas: Normal, with no mass or duct dilation. Spleen: Normal size. No mass. Adrenals/Urinary Tract: Normal adrenals. No renal stones. No hydronephrosis. Simple 3.4 cm anterior interpolar left renal cyst, for which no follow-up imaging is recommended. Stomach/Bowel: Normal non-distended stomach. Visualized small and large bowel is normal caliber, with no bowel wall thickening. Vascular/Lymphatic: Atherosclerotic nonaneurysmal abdominal aorta. Mild paraumbilical and ventral right abdominal varices. No pathologically enlarged lymph nodes in the abdomen. Other: Small volume perihepatic ascites. No  focal fluid collection. No pneumoperitoneum. Mild-to-moderate anasarca is worsened. No evidence of retroperitoneal hematoma. Musculoskeletal: No aggressive appearing focal osseous lesions. Mild thoracolumbar spondylosis. IMPRESSION: 1. No evidence of retroperitoneal hematoma. 2. Severe diffuse hepatic steatosis. Relative hypertrophy of the caudate lobe with lobulated liver contour, cannot exclude cirrhosis. No discrete liver mass on this noncontrast exam. 3. Small volume perihepatic ascites. Mild paraumbilical and ventral right abdominal varices. Mild-to-moderate anasarca is worsened. 4. Coronary atherosclerosis. 5.  Aortic Atherosclerosis (ICD10-I70.0). Electronically Signed   By: Delbert Phenix M.D.   On: 05/01/2023 19:23       LOS:  7 days     Kendell Bane, MD [facility] 05/05/2023, 11:53 AM   If 7PM-7AM, please contact night coverage [email or web address]

## 2023-05-05 NOTE — Progress Notes (Addendum)
Subjective: Reports some hallucinations, ceiling looks like it was crawling, sees fleeting shadows. No overt confusion on my exam. Reports some generalized abdominal pain, some nausea, and a very small amount of vomiting yesterday without hematemesis. Abdomen is more tight and distended.  Eating a small amount. Makes him feel nauseated if he eats much. Has a lot of bruising and pain in his flanks along with redness. Sates he was drug over from the CT machine back to the bed in the ER. No Bm today and hasn't passed any gas. Reports "squirts" of stool yesterday. No overt GI bleeding.   Objective: Vital signs in last 24 hours: Temp:  [97.7 F (36.5 C)-99.4 F (37.4 C)] 99.4 F (37.4 C) (08/20 0615) Pulse Rate:  [75-81] 81 (08/20 0859) Resp:  [16-19] 16 (08/20 0615) BP: (103-125)/(64-83) 125/82 (08/20 0859) SpO2:  [98 %-99 %] 99 % (08/20 0615) Weight:  [110.8 kg] 110.8 kg (08/20 0615) Last BM Date : 05/04/23 General:   Alert and oriented, pleasant, NAD Head:  Normocephalic and atraumatic. Eyes:  + scleral icterus.   Abdomen:  Bowel sounds present.  Distended, generalized tenderness to palpation.  Extensive bruising along the left flank and into left hip with erythema and tenderness.  Small amount of bruising in the right lower quadrant with erythema in right lower quadrant/right flank.  Soft, reducible umbilical hernia.  No rebound or guarding.  No pitting edema in the flanks. Extremities: No pitting edema in the distal lower extremities, but pitting edema is present in the upper thighs/hips. Neurologic:  Alert and  oriented x4;  grossly normal neurologically.  No asterixis. Skin:  Warm and dry, intact without significant lesions.  Psych:  Normal mood and affect.  Intake/Output from previous day: 08/19 0701 - 08/20 0700 In: 480 [P.O.:480] Out: 2250 [Urine:2250] Intake/Output this shift: Total I/O In: 240 [P.O.:240] Out: -   Lab Results: Recent Labs    05/03/23 0433  05/04/23 0530  WBC 6.9 7.0  HGB 10.4* 11.1*  HCT 30.9* 33.3*  PLT 122* 142*   BMET Recent Labs    05/03/23 0433 05/04/23 0530 05/05/23 0439  NA 128* 129* 128*  K 3.5 2.9* 3.0*  CL 92* 94* 95*  CO2 24 24 22   GLUCOSE 77 78 94  BUN 9 9 10   CREATININE 1.06 1.07 1.15  CALCIUM 8.5* 8.6* 8.3*   LFT Recent Labs    05/03/23 0433 05/04/23 0530 05/05/23 0439  PROT 6.4* 7.1 6.3*  ALBUMIN 2.3* 2.4* 2.2*  AST 137* 137* 114*  ALT 36 40 37  ALKPHOS 175* 173* 142*  BILITOT 7.8* 7.5* 5.9*   PT/INR Recent Labs    05/04/23 0530 05/05/23 0439  LABPROT 17.2* 18.1*  INR 1.4* 1.5*    Studies/Results: DG Abd 2 Views  Result Date: 05/04/2023 CLINICAL DATA:  Abdominal distension EXAM: ABDOMEN - 2 VIEW COMPARISON:  05/01/2023, 04/29/2023 FINDINGS: Progressive gaseous distension of both large and small bowel loops throughout the abdomen. No gross free intraperitoneal air. IMPRESSION: Progressive gaseous distension of both large and small bowel loops throughout the abdomen. Pattern favors ileus. Obstruction not excluded. Electronically Signed   By: Duanne Guess D.O.   On: 05/04/2023 18:54   [place] ASCITES (ABDOMEN LIMITED)  Result Date: 05/04/2023 CLINICAL DATA:  Abdominal ascites. EXAM: LIMITED ABDOMEN ULTRASOUND FOR ASCITES TECHNIQUE: Limited ultrasound survey for ascites was performed in all four abdominal quadrants. COMPARISON:  CT of the abdomen 05/01/2023 FINDINGS: Small volume ascites is present in the right lower quadrant  and at the midline. No significant fluid pockets are present to allow for paracentesis. IMPRESSION: Stable appearance of small volume ascites. Electronically Signed   By: Marin Roberts M.D.   On: 05/04/2023 12:41    Assessment: 52 y.o. year old male with history of CAD, WPW s/p ablation, chronic pain, diastolic heart failure, GERD, HTN, adenomatous colon polyp, gastric ulcers, ulcerative esophagitis, alcohol abuse, now admitted with new findings of  decompensated cirrhosis and abdominal pain.   Generalized abdominal pain: Ongoing with associated abdominal distention, nausea, and low-volume emesis with last episode yesterday.  No improvement since admission.  Likely related to constipation vs developing ileus.  Abdominal x-ray completed yesterday with progressive gas distention of both large and small bowel loops throughout the abdomen.  Reports only having 1 very small, liquid bowel movement yesterday.  No BM today and has not passed any gas.  He has been receiving lactulose 10 g twice daily and Linzess 290 mcg daily.  Will order CT A/P for further evaluation. Notably, prior CTs this admission did not show any evidence of bowel dilation or wall thickening.  He has been evaluated for paracentesis twice this admission, last yesterday, but doesn't have enough fluid for paracentesis.   Bruising along flanks with redness:  New onset this admission. Reportedly occurred during transfer for CT on 8/13 Now with some erythema and increasing pain as well. Will notify hospitalist.  May be developing cellulitis.  Prior CT on 8/16 with no evidence of retroperitoneal hematoma.  Decompensated Cirrhosis:  Likely secondary to alcohol.  MELD 3.0 is 25 today.  There has been concern for tense ascites in light of abdominal distention, but ultrasound has shown patient has very small amount of ascites, not enough for paracentesis.  Liver Doppler without any evidence of PVT.  He has no pitting edema in the lower extremities, but does have pitting edema in his flanks/lower abdomen and upper thighs/hips.  Previously on Lasix 40 mg outpatient, but this was initially held on admission due to hypokalemia.  Lasix 40 mg daily and spironolactone 50 mg daily were started on 8/18 and patient has had recurrent hypokalemia with potassium 3.0 today (2.9 yesterday) despite receiving 60 mEq total of potassium yesterday. Hyponatremia persists, but is stable at 128 today.  Expect sodium will  likely remain low in setting of cirrhosis.  In light of worsening hypokalemia, will increase spironolactone to 100 mg daily as kidney function remains within normal limits and decrease lasix to 20 mg daily.   There is also been concern for encephalopathy and has been started on lactulose in addition to Linzess this admission due to constipation/obstipation, but still has not had any significant stool output.  May be developing ileus as discussed above.  He reports having some hallucinations, but is alert and oriented x 4 today and has no asterixis on exam.   Anemia: Hemoglobin has remained stable at 11.1 yesterday.  No overt GI bleeding this admission.  Overdue for EGD and colonoscopy, both of which can be completed outpatient unless he develops overt GI bleeding or worsening anemia. Last EGD 09/18/2022 for hematemesis with grade D reflux esophagitis with ulceration, recently bleeding, gastritis biopsied. No esophageal or gastric varices. Recommended repeat EGD in 8 weeks with Dr. Jena Gauss. Pathology with gastric antral and oxyntic mucosa with no specific histopathologic changes, H. pylori negative. Last colonoscopy 05/12/2013: Hemorrhoids, colonic and rectal polyps removed. Pathology with tubular adenomas. Recommended 5-year surveillance.  Elevated LFTs and bilirubin: DF remains less than 32.  T. bili  decreasing today, down to 5.2.  Alk phos and AST slowly improving.    Plan: CT A/P with IV contrast only.  Increase spironolactone to 100 mg daily. Decrease Lasix to 20 mg daily. May benefit from IV albumin in light of anasarca.  Will discuss with Dr. Levon Hedger. Further management of hypokalemia/hyponatremia per hospitalist. MELD labs along with CBC tomorrow.  Outpatient EGD and colonoscopy. Will notify attending about erythema around bruising along flanks and upper left hip.     LOS: 7 days    05/05/2023, 11:13 AM   Ermalinda Memos, [facility]

## 2023-05-05 NOTE — TOC Progression Note (Signed)
Transition of Care [facility]) - Progression Note    Patient Details  Name: Timothy [name] MRN: [id number] Date of Birth: Jan 09, 1971  Transition of Care [facility]) CM/SW Contact  Elliot Gault, LCSW Phone Number: 05/05/2023, 10:22 AM  Clinical Narrative:     TOC following. Per MD, pt not medically stable for dc. Pt's current SNF auth expires at 11:59pm today. TOC will start new SNF auth request. Requested that MD order new PT eval. Auth will be re-started after that. MD hopeful for DC Thursday this week.  Updated [facility] at [facility]. TOC will follow.  Expected Discharge Plan: Skilled Nursing Facility Barriers to Discharge: Continued Medical Work up  Expected Discharge Plan and Services In-house Referral: Clinical Social Work     Living arrangements for the past 2 months: Single Family Home                                       Social Determinants of Health (SDOH) Interventions SDOH Screenings   Food Insecurity: No Food Insecurity (05/03/2023)  Housing: Low Risk  (05/03/2023)  Transportation Needs: No Transportation Needs (05/03/2023)  Utilities: Not At Risk (05/03/2023)  Tobacco Use: High Risk (05/03/2023)    Readmission Risk Interventions     No data to display

## 2023-05-06 DIAGNOSIS — K7031 Alcoholic cirrhosis of liver with ascites: Secondary | ICD-10-CM | POA: Diagnosis not present

## 2023-05-06 LAB — COMPREHENSIVE METABOLIC PANEL
ALT: 32 U/L (ref 0–44)
AST: 101 U/L — ABNORMAL HIGH (ref 15–41)
Albumin: 3.2 g/dL — ABNORMAL LOW (ref 3.5–5.0)
Alkaline Phosphatase: 125 U/L (ref 38–126)
Anion gap: 10 (ref 5–15)
BUN: 8 mg/dL (ref 6–20)
CO2: 21 mmol/L — ABNORMAL LOW (ref 22–32)
Calcium: 9.2 mg/dL (ref 8.9–10.3)
Chloride: 102 mmol/L (ref 98–111)
Creatinine, Ser: 0.85 mg/dL (ref 0.61–1.24)
GFR, Estimated: >60 mL/min (ref >60)
Glucose, Bld: 89 mg/dL (ref 70–99)
Potassium: 3.1 mmol/L — ABNORMAL LOW (ref 3.5–5.1)
Sodium: 133 mmol/L — ABNORMAL LOW (ref 135–145)
Total Bilirubin: 7.3 mg/dL — ABNORMAL HIGH (ref 0.3–1.2)
Total Protein: 6.7 g/dL (ref 6.5–8.1)

## 2023-05-06 LAB — MAGNESIUM: Magnesium: 1.8 mg/dL (ref 1.7–2.4)

## 2023-05-06 LAB — PROTIME-INR
INR: 1.6 — ABNORMAL HIGH (ref 0.8–1.2)
Prothrombin Time: 19.7 s — ABNORMAL HIGH (ref 11.4–15.2)

## 2023-05-06 LAB — GLUCOSE, CAPILLARY: Glucose-Capillary: 70 mg/dL (ref 70–99)

## 2023-05-06 MED ORDER — FUROSEMIDE 40 MG PO TABS
40.0000 mg | ORAL_TABLET | Freq: Two times a day (BID) | ORAL | Status: DC
  Administered 2023-05-06 – 2023-05-08 (×6): 40 mg via ORAL
  Filled 2023-05-06 (×5): qty 1

## 2023-05-06 MED ORDER — POTASSIUM CHLORIDE CRYS ER 20 MEQ PO TBCR
40.0000 meq | EXTENDED_RELEASE_TABLET | Freq: Once | ORAL | Status: AC
  Administered 2023-05-06: 40 meq via ORAL
  Filled 2023-05-06: qty 2

## 2023-05-06 MED ORDER — GABAPENTIN 100 MG PO CAPS
200.0000 mg | ORAL_CAPSULE | Freq: Three times a day (TID) | ORAL | Status: DC
  Administered 2023-05-06 – 2023-05-12 (×18): 200 mg via ORAL
  Filled 2023-05-06 (×18): qty 2

## 2023-05-06 MED ORDER — PREDNISOLONE 5 MG PO TABS
40.0000 mg | ORAL_TABLET | Freq: Every day | ORAL | Status: DC
  Administered 2023-05-06 – 2023-05-12 (×7): 40 mg via ORAL
  Filled 2023-05-06 (×8): qty 8

## 2023-05-06 MED ORDER — PANTOPRAZOLE SODIUM 40 MG PO TBEC: 40.0000 mg | DELAYED_RELEASE_TABLET | Freq: Every day | ORAL | Status: DC

## 2023-05-06 MED ORDER — PANTOPRAZOLE SODIUM 40 MG PO TBEC
40.0000 mg | DELAYED_RELEASE_TABLET | Freq: Every day | ORAL | Status: DC
  Administered 2023-05-06 – 2023-05-10 (×5): 40 mg via ORAL
  Filled 2023-05-06 (×5): qty 1

## 2023-05-06 NOTE — Progress Notes (Signed)
Subjective: He had 3 BMs yesterday though looser and small in volume, he estimates maybe a few ounces at the largest. He is having a lot of abdominal pain. He was able to eat about 12 bites this morning but then vomited thereafter.  No BMs today. Still having nausea. Still with ecchymosis to flank and pitting edema in upper thighs/hips. Receiving albumin iv.  Objective: Vital signs in last 24 hours: Temp:  [98 F (36.7 C)-98.5 F (36.9 C)] 98.1 F (36.7 C) (08/21 0538) Pulse Rate:  [74-80] 80 (08/21 0538) Resp:  [16] 16 (08/20 1208) BP: (106-128)/(68-87) 128/87 (08/21 0538) SpO2:  [96 %-100 %] 100 % (08/21 0538) Weight:  [111.8 kg] 111.8 kg (08/21 0500) Last BM Date : 05/05/23 General:   Alert and oriented, pleasant Head:  Normocephalic and atraumatic. Eyes:  No icterus, sclera clear. Conjuctiva pink.  Heart:  S1, S2 present, no murmurs noted.  Lungs: Clear to auscultation bilaterally, without wheezing, rales, or rhonchi.  Abdomen:  Bowel sounds present, abdomen is distended and tight. No HSM or hernias noted. No rebound or guarding. No masses appreciated  Msk:  Symmetrical without gross deformities. Normal posture. Pulses:  Normal pulses noted. Extremities:  Without clubbing or edema. Neurologic:  Alert and  oriented x4;  grossly normal neurologically. Skin:  Warm and dry, intact without significant lesions.  Psych:  Alert and cooperative. Normal mood and affect.  Intake/Output from previous day: 08/20 0701 - 08/21 0700 In: 1229.6 [P.O.:960; I.V.:19.6; IV Piggyback:250] Out: 1700 [Urine:1700] Intake/Output this shift: Total I/O In: 240 [P.O.:240] Out: -   Lab Results: Recent Labs    05/04/23 0530  WBC 7.0  HGB 11.1*  HCT 33.3*  PLT 142*   BMET Recent Labs    05/04/23 0530 05/05/23 0439 05/06/23 0443  NA 129* 128* 133*  K 2.9* 3.0* 3.1*  CL 94* 95* 102  CO2 24 22 21*  GLUCOSE 78 94 89  BUN 9 10 8   CREATININE 1.07 1.15 0.85  CALCIUM 8.6* 8.3* 9.2    LFT Recent Labs    05/04/23 0530 05/05/23 0439 05/06/23 0443  PROT 7.1 6.3* 6.7  ALBUMIN 2.4* 2.2* 3.2*  AST 137* 114* 101*  ALT 40 37 32  ALKPHOS 173* 142* 125  BILITOT 7.5* 5.9* 7.3*   PT/INR Recent Labs    05/05/23 0439 05/06/23 0443  LABPROT 18.1* 19.7*  INR 1.5* 1.6*   Studies/Results: CT ABDOMEN PELVIS W CONTRAST  Result Date: 05/05/2023 CLINICAL DATA:  Abdominal pain and distention EXAM: CT ABDOMEN AND PELVIS WITH CONTRAST TECHNIQUE: Multidetector CT imaging of the abdomen and pelvis was performed using the standard protocol following bolus administration of intravenous contrast. RADIATION DOSE REDUCTION: This exam was performed according to the departmental dose-optimization program which includes automated exposure control, adjustment of the mA and/or kV according to patient size and/or use of iterative reconstruction technique. CONTRAST:  OMNIPAQUE IOHEXOL 300 MG/ML  SOLN COMPARISON:  Abdominal CT 05/01/2023. CT abdomen and pelvis 04/28/2023. FINDINGS: Lower chest: No acute abnormality. Hepatobiliary: Diffusely heterogeneous and hypoattenuating as seen on the prior study compatible with fatty infiltration and likely cirrhosis. Gallbladder and bile ducts are within normal limits. Pancreas: Unremarkable. No pancreatic ductal dilatation or surrounding inflammatory changes. Spleen: Normal in size without focal abnormality. Adrenals/Urinary Tract: Left renal cysts are present measuring up to 3.2 cm. There is no hydronephrosis. The adrenal glands bladder are within normal limits. Stomach/Bowel: Colon is diffusely distended with air-fluid levels. No transition point. No focal inflammation. The appendix  is not seen. Small bowel and stomach are within normal limits. Vascular/Lymphatic: Aortic atherosclerosis. No enlarged abdominal or pelvic lymph nodes. Reproductive: Prostate is unremarkable. Other: There is trace ascites. There is diffuse abdominal wall edema. Musculoskeletal: No  acute or significant osseous findings. IMPRESSION: 1. Colon is diffusely distended with air-fluid levels. No transition point. Findings are compatible with ileus. 2. Cirrhotic and fatty infiltration of the liver. 3. Trace ascites. 4. Body wall edema. 5. Left Bosniak I benign renal cyst measuring 3.2 cm. No follow-up imaging is recommended. JACR 2018 Feb; 264-273, Management of the Incidental Renal Mass on CT, [employer] 2021; [id number], Bosniak Classification of Cystic Renal Masses, Version 2019. Aortic Atherosclerosis (ICD10-I70.0). Electronically Signed   By: Darliss Cheney M.D.   On: 05/05/2023 18:57   US Venous Img Lower Bilateral (DVT)  Result Date: 05/05/2023 CLINICAL DATA:  Bilateral lower extremity pain and edema for the past 2 weeks. History of smoking. Evaluate for DVT. EXAM: BILATERAL LOWER EXTREMITY VENOUS DOPPLER ULTRASOUND TECHNIQUE: Gray-scale sonography with graded compression, as well as color Doppler and duplex ultrasound were performed to evaluate the lower extremity deep venous systems from the level of the common femoral vein and including the common femoral, femoral, profunda femoral, popliteal and calf veins including the posterior tibial, peroneal and gastrocnemius veins when visible. The superficial great saphenous vein was also interrogated. Spectral Doppler was utilized to evaluate flow at rest and with distal augmentation maneuvers in the common femoral, femoral and popliteal veins. COMPARISON:  None Available. FINDINGS: RIGHT LOWER EXTREMITY Common Femoral Vein: No evidence of thrombus. Normal compressibility, respiratory phasicity and response to augmentation. Saphenofemoral Junction: No evidence of thrombus. Normal compressibility and flow on color Doppler imaging. Profunda Femoral Vein: No evidence of thrombus. Normal compressibility and flow on color Doppler imaging. Femoral Vein: No evidence of thrombus. Normal compressibility, respiratory phasicity and response to augmentation.  Popliteal Vein: No evidence of thrombus. Normal compressibility, respiratory phasicity and response to augmentation. Rouleaux flow is noted within the left popliteal vein. Calf Veins: No evidence of thrombus. Normal compressibility and flow on color Doppler imaging. Superficial Great Saphenous Vein: No evidence of thrombus. Normal compressibility. Other Findings:  None. LEFT LOWER EXTREMITY Common Femoral Vein: No evidence of thrombus. Normal compressibility, respiratory phasicity and response to augmentation. Saphenofemoral Junction: No evidence of thrombus. Normal compressibility and flow on color Doppler imaging. Profunda Femoral Vein: No evidence of thrombus. Normal compressibility and flow on color Doppler imaging. Femoral Vein: No evidence of thrombus. Normal compressibility, respiratory phasicity and response to augmentation. Popliteal Vein: No evidence of thrombus. Normal compressibility, respiratory phasicity and response to augmentation. Calf Veins: No evidence of thrombus. Normal compressibility and flow on color Doppler imaging. Superficial Great Saphenous Vein: No evidence of thrombus. Normal compressibility. Other Findings:  None. IMPRESSION: No evidence of DVT within either lower extremity. Electronically Signed   By: Simonne Come M.D.   On: 05/05/2023 16:40   DG Abd 2 Views  Result Date: 05/04/2023 CLINICAL DATA:  Abdominal distension EXAM: ABDOMEN - 2 VIEW COMPARISON:  05/01/2023, 04/29/2023 FINDINGS: Progressive gaseous distension of both large and small bowel loops throughout the abdomen. No gross free intraperitoneal air. IMPRESSION: Progressive gaseous distension of both large and small bowel loops throughout the abdomen. Pattern favors ileus. Obstruction not excluded. Electronically Signed   By: Duanne Guess D.O.   On: 05/04/2023 18:54   [place] ASCITES (ABDOMEN LIMITED)  Result Date: 05/04/2023 CLINICAL DATA:  Abdominal ascites. EXAM: LIMITED ABDOMEN ULTRASOUND FOR ASCITES TECHNIQUE:  Limited ultrasound  survey for ascites was performed in all four abdominal quadrants. COMPARISON:  CT of the abdomen 05/01/2023 FINDINGS: Small volume ascites is present in the right lower quadrant and at the midline. No significant fluid pockets are present to allow for paracentesis. IMPRESSION: Stable appearance of small volume ascites. Electronically Signed   By: Marin Roberts M.D.   On: 05/04/2023 12:41    Assessment: Micheal Yuska as well as a 52 year old male currently admitted with new findings of decompensated cirrhosis and abdominal pain/distention.  Abdominal pain and distention: Ongoing with associated nausea and low-volume emesis.  Tolerated a small amount of food this morning but had vomiting thereafter. little improvement this admission.  KUB Monday with progressive gas distention of both large and small bowel loops throughout the abdomen.  Passing very small amounts of gas and stool though is belching more today.  He has been evaluated for paracentesis twice this admission but not enough fluid to proceed.  CT abdomen pelvis yesterday showed: Diffusely distended with air-fluid levels.  No transition point.  Findings compatible with ileus.  Some trace ascites.  Body wall edema.   Notably he developed some bruising and redness along his flanks this admission.  Reportedly occurred during transfer for CT on 8/13 as he tells me he was drug over the side rails of stretcher during transfer.  Endorsing some pain in these areas.  Hospitalist is aware.  No evidence of retroperitoneal hematoma on CT done yesterday.  He is on lactulose 30mg  BID and linzess daily.   Decompensated cirrhosis: MELD 3.0 is 21 there has been concern for tense ascites in light of abdominal distention, however ultrasound x 2 with minimal ascites, not enough for paracentesis.  Liver Doppler without any evidence of PVT.  No pitting edema in LEs though he does have pitting edema in his flank/lower abdomen and upper thighs.   Previously on Lasix 40 mg outpatient but this was held on admission due to hypokalemia.  Lasix 40mg  and spironolactone 50 mg daily started on 8/18 and patient has had recurrence of hypokalemia and hyponatremia.  In light of worsening hypokalemia, spironolactone was increased to 100 mg daily yesterday and Lasix decreased to 20 mg daily.  Potassium and sodium have both improved with potassium 3.1 and sodium 133.  Creatinine remained stable. He is receiving IV albumin  Anemia: Hemoglobin 10-11 range this admission.  No overt GI bleeding.  Overdue for EGD and colonoscopy which can both be completed as outpatient unless he develops overt GI bleeding or significant decline in hemoglobin.  EGD in January 2024 for hematemesis with grade D reflux esophagitis with ulceration, recently bleeding gastritis biopsied.  No esophageal varices.  Recommend repeat EGD 8 weeks thereafter though he was lost to follow-up for this.  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 Will need to continue with PPI BID.   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  ALT normal at 32, however bilirubin is up to 7.3 today was 5.9 yesterday, peaked at 7.8 three days ago. DF is now 38.1 with PT control of 13. Discussed with Dr. Jena Gauss, will start prednisolone given DF >32.    Plan: Spironolactone 100mg /lasix 20mg  daily MELD labs daily Trend h&h daily Monitor for overt GI bleeding Outpatient EGD/Colonoscopy Continue lactulose 30g BID Limit opiates as much as possible Continue linzess 290 Ambulation as much as able  Prednisolone 40mg  daily  LOS: 8 days    05/06/2023, 9:41 AM  Tong Pieczynski L. Jeanmarie Hubert, MSN, APRN, AGNP-C Adult-Gerontology Nurse Practitioner [facility]

## 2023-05-06 NOTE — Progress Notes (Signed)
PROGRESS NOTE    Timothy [name]   NWG:[id number] DOB: 10/28/1970  DOA: 04/28/2023 Date of Service: 05/06/23 PCP: Elfredia Nevins, MD     Brief Narrative / Hospital Course:  Timothy [name] is a 52 year old male with history of HTN, Barrett esophagus, PUD, WPW status post ablation, NICM, alcohol abuse, and liver disease who presents with worsening abdominal distention and pain. 08/13: to ED. Mult electrolyte derangement, low Plt. CT of the abdomen pelvis with IV contrast showed presence of new cirrhotic changes with small volume of ascites. Admitted w/ decompensated cirrhosis/ascites. GI consult. Plan paracentesis. Plan start furosemide/spironolactone when electrolytes are improved 08/14: Ultrasound today reveals trace perihepatic ascites insufficient for tap. Subsequent plain films revealed nonspecific bowel gas pattern with some increase in colonic air. No evidence of obstruction, impaction or free air. Plan liver doppler.  08/15: Liver doppler done. Add spironolactone 100mg  daily/continue lasix 40mg  daily. More aggressive bowel regimen given no signs of obstruction and narcotic use. Will start Linzess daily. Can titrate accordingly.  08/16: Liver Doppler studies with severe steatosis, portal hypertension and patent portal vein. Start lactulose 10 g twice daily. Holding diuretics. Repeated CT to eval L flank bruise and drop Hgb, no hematoma 08/17: still off lasix/spiro, increase lactulose to 20 bid. Stable to transfer out of stepdown. Follow GI recs - consider repeat US / attempt paracentesis?  08/18: BP and K improved, restart lasix/spiro today. Of note, pt reported concern for when he was down for his MRI, he was moved roughly by staff and this maybe caused the bruising,   05/04/2023: Improving, low sodium, potassium..  Sodium potassium repleted 05/05/2023: Reporting of increased abdominal pain, distention, feeling weak, constipated, 05/06/2023: Complain of generalized weaknesses,  generalized aches and pain, still having abdominal distention   Subjective / Brief ROS:  The patient was seen and examined this morning stating he is not feeling too well, still complaining lysed weakness, abdominal distention       ASSESSMENT & PLAN:   Principal Problem:   Abdominal pain Active Problems:   Alcoholic cirrhosis (HCC)   Anxiety state   Barrett's esophagus without dysplasia   Hypomagnesemia   Essential hypertension   Hypokalemia   History of Wolff-Parkinson-White syndrome   GERD (gastroesophageal reflux disease)   Acute CHF (congestive heart failure) (HCC)   ETOH abuse   Dyslipidemia   Elevated LFTs   Alcohol withdrawal/DTs  Decompensated Alcoholic Liver Cirrhosis  / Generalized Abdominal pain Progressive alcoholic liver cirrhosis and severe steatosis resulting in portal HTN, ascites, abdominal distention.   Hyponatremia d/t liver disease, EtOH Thrombocytopenia d/t liver disease  MELD score of 25, Maddrey DF 30.7-not a candidate for steroid. Liver Doppler without evidence of PVT.  Not enough ascites for paracentesis.... Using ultrasound and evaluation of ascites  No signs of SBP.  GI following -obtaining another CT scan of abdomen today Lasix  40 mg po daily Spiro at 50 mg po daily  Lactulose 20 bid - will reduce back to 10, he's reporting liquid stool  2g sodium diet  Absolute EtOH cessation  Needs outpatient colonoscopy for surveillance purposes. Can consider EGD at the same time to evaluate for varices as well as to screen for Barrett's esophagus.  Obtaining CT scan of abdomenDiffuse colonic distention with air-fluid, possible ileus, liver cirrhosis and fatty infiltration, trace ascites, body wall edema, benign renal cyst 3.2 cm:   Constipation  Complicated by opiate dependence, ascites  Lactulose as above  BM x 2 in past 24 hours Linzess 290  mcg daily   Anemia stable no evidence of active GI bleeding.  Previous EGD with ulcerative esophagitis.   Continue to monitor hemoglobin no plans for inpatient endoscopic evaluation  Continue PPI twice daily.   Bilateral flank bruising No hematoma on CT  Monitor on physical exam Monitor HH/CBC   Hypomagnesemia /Hypokalemia/hyponatremia Replace as needed Monitor BMP, Mg Added sodium supplements   Barrett's esophagus without dysplasia GERD Continue PPI twice daily.   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 echo 12/19/2020:  LVEF 45-50%, mildly decreased LV function moderate LVH, grade 1 diastolic dysfunction impaired relaxation Monitor I's and O's Limit IV fluid resuscitation  History of Wolff-Parkinson-White syndrome Status post ablation, monitoring  Essential hypertension Blood pressure was running low, close slowly improving  Dyslipidemia Holding statins due to elevated LFTs due to alcohol use abuse liver cirrhosis     Consultants:  Gastroenterology   Procedures: None    DVT prophylaxis: SCD d/t low Plt Pertinent IV fluids/nutrition: no continuous IV fluids  Central lines / invasive devices: none  Code Status: FULL CODE ACP documentation reviewed: 05/04/23 none on file   Current Admission Status: inpatient TOC needs / Dispo plan: SNF rehab when stable Barriers to discharge / significant pending items: placement pending, pending clinical improvement / tolerating lasix/spiro       Family Communication: none at this time     Objective Findings:  Vitals:   05/05/23 1208  05/05/23 2114 05/06/23 0500 05/06/23 0538  BP: 107/68 106/70  128/87  Pulse: 76 74  80  Resp: 16     Temp: 98 F (36.7 C) 98.5 F (36.9 C)  98.1 F (36.7 C)  TempSrc: Oral     SpO2: 96% 98%  100%  Weight:   111.8 kg   Height:        Intake/Output Summary (Last 24 hours) at 05/06/2023 1219 Last data filed at 05/06/2023 0935 Gross per 24 hour  Intake 1229.6 ml  Output 1700 ml  Net -470.4 ml   Filed Weights   05/04/23 0555 05/05/23 0615 05/06/23 0500  Weight: 111.2 kg 110.8 kg 111.8 kg        General:  AAO x 3,  cooperative, no distress;   HEENT:  Normocephalic, PERRL, otherwise with in Normal limits   Neuro:  CNII-XII intact. , normal motor and sensation, reflexes intact   Lungs:   Clear to auscultation BL, Respirations unlabored,  No wheezes / crackles  Cardio:    S1/S2, RRR, No murmure, No Rubs or Gallops   Abdomen:  Abdominal distention-mild fluid shift, soft, non-tender, bowel sounds active all four quadrants, no guarding or peritoneal signs.  Muscular  skeletal:  Bilateral flank hematoma,  Limited exam -sever global generalized weaknesses - in bed, able to move all 4 extremities,   2+ pulses,  symmetric, No pitting edema  Skin:  Dry, warm to touch, negative for any Rashes,  Wounds: Please see nursing documentation          Scheduled Medications:   carvedilol  6.25  mg Oral BID WC   folic acid  1 mg Oral Daily   furosemide  40 mg Oral BID   gabapentin  300 mg Oral TID   lactulose  30 g Oral BID   linaclotide  290 mcg Oral QAC breakfast   multivitamin with minerals  1 tablet Oral Daily   nicotine  21 mg Transdermal Daily   pantoprazole  40 mg Oral Daily   senna-docusate  1 tablet Oral QHS   sodium chloride flush  3 mL Intravenous Q12H   sodium chloride  1 g Oral BID WC   spironolactone  50 mg Oral Daily   sucralfate  1 g Oral BID   thiamine  100 mg Oral Daily   Or   thiamine  100 mg Intravenous Daily   traZODone  100 mg Oral QHS    Continuous  Infusions:  sodium chloride 10 mL/hr at 05/05/23 1652   albumin human 50 g (05/06/23 1106)    PRN Medications:  sodium chloride, guaiFENesin-dextromethorphan, HYDROmorphone (DILAUDID) injection, hydrOXYzine, ipratropium, levalbuterol, ondansetron **OR** ondansetron (ZOFRAN) IV, mouth rinse, oxyCODONE, sodium phosphate, zolpidem  Antimicrobials from admission:  Anti-infectives (From admission, onward)    None       Data Reviewed:  I have personally reviewed the following...  CBC: Recent Labs  Lab 05/01/23 0446 05/01/23 1439 05/02/23 0346 05/03/23 0433 05/04/23 0530  WBC 7.5 8.2 6.3 6.9 7.0  HGB 10.2* 10.1* 9.9* 10.4* 11.1*  HCT 30.5* 30.1* 29.6* 30.9* 33.3*  MCV 98.1 97.7 97.4 98.1 97.9  PLT 120* 124* 119* 122* 142*   Basic Metabolic Panel: Recent Labs  Lab 04/30/23 0522 05/01/23 0446 05/02/23 0346 05/03/23 0433 05/04/23 0530 05/05/23 0439 05/06/23 0443  NA 129*   < > 129* 128* 129* 128* 133*  K 2.9*   < > 3.1* 3.5 2.9* 3.0* 3.1*  CL 91*   < > 95* 92* 94* 95* 102  CO2 29   < > 26 24 24 22  21*  GLUCOSE 80   < > 92 77 78 94 89  BUN 13   < > 11 9 9 10 8   CREATININE 1.22   < > 1.09 1.06 1.07 1.15 0.85  CALCIUM 8.1*   < > 8.4* 8.5* 8.6* 8.3* 9.2  MG 2.0  --   --   --   --   --   --    < > = values in this interval not displayed.   GFR: Estimated Creatinine Clearance: 128.3 mL/min (by C-G formula based on SCr of 0.85 mg/dL). Liver Function Tests: Recent Labs  Lab 05/02/23 0346 05/03/23 0433 05/04/23 0530 05/05/23 0439 05/06/23 0443  AST 143* 137* 137* 114* 101*  ALT 34 36 40 37 32  ALKPHOS 177* 175* 173* 142* 125  BILITOT 6.9* 7.8* 7.5* 5.9* 7.3*  PROT 6.3* 6.4* 7.1 6.3* 6.7  ALBUMIN 2.2* 2.3* 2.4* 2.2* 3.2*   Recent Labs  Lab 05/05/23 1713  LIPASE 76*    No results for input(s): "AMMONIA" in the last 168 hours. Coagulation Profile: Recent Labs  Lab 05/02/23 0346 05/03/23 0433 05/04/23 0530 05/05/23 0439 05/06/23 0443  INR 1.4* 1.5* 1.4*  1.5* 1.6*    CBG: Recent Labs  Lab 05/02/23 0720 05/03/23 0803 05/04/23 0755 05/05/23 0725 05/06/23 0751  GLUCAP 86 73 90 82 70    Most Recent Urinalysis On File:     Component Value Date/Time   COLORURINE YELLOW 04/11/2016 1044   APPEARANCEUR CLEAR 04/11/2016 1044   LABSPEC  1.011 04/11/2016 1044   PHURINE 7.5 04/11/2016 1044   GLUCOSEU NEGATIVE 04/11/2016 1044   HGBUR NEGATIVE 04/11/2016 1044   BILIRUBINUR NEGATIVE 04/11/2016 1044   KETONESUR NEGATIVE 04/11/2016 1044   PROTEINUR NEGATIVE 04/11/2016 1044   UROBILINOGEN 0.2 10/29/2014 0937   NITRITE NEGATIVE 04/11/2016 1044   LEUKOCYTESUR NEGATIVE 04/11/2016 1044   Sepsis Labs: @LABRCNTIP (procalcitonin:4,lacticidven:4) Microbiology: Recent Results (from the past 240 hour(s))  MRSA Next Gen by PCR, Nasal     Status: None   Collection Time: 04/29/23 12:25 AM   Specimen: Nasal Mucosa; Nasal Swab  Result Value Ref Range Status   MRSA by PCR Next Gen NOT DETECTED NOT DETECTED Final    Comment: (NOTE) The GeneXpert MRSA Assay (FDA approved for NASAL specimens only), is one component of a comprehensive MRSA colonization surveillance program. It is not intended to diagnose MRSA infection nor to guide or monitor treatment for MRSA infections. Test performance is not FDA approved in patients less than 40 years old. Performed at [facility], [address], [place], [place] [address]       Radiology Studies last 3 days: CT ABDOMEN PELVIS W CONTRAST  Result Date: 05/05/2023 CLINICAL DATA:  Abdominal pain and distention EXAM: CT ABDOMEN AND PELVIS WITH CONTRAST TECHNIQUE: Multidetector CT imaging of the abdomen and pelvis was performed using the standard protocol following bolus administration of intravenous contrast. RADIATION DOSE REDUCTION: This exam was performed according to the departmental dose-optimization program which includes automated exposure control, adjustment of the mA and/or kV according to patient size and/or  use of iterative reconstruction technique. CONTRAST:  OMNIPAQUE IOHEXOL 300 MG/ML  SOLN COMPARISON:  Abdominal CT 05/01/2023. CT abdomen and pelvis 04/28/2023. FINDINGS: Lower chest: No acute abnormality. Hepatobiliary: Diffusely heterogeneous and hypoattenuating as seen on the prior study compatible with fatty infiltration and likely cirrhosis. Gallbladder and bile ducts are within normal limits. Pancreas: Unremarkable. No pancreatic ductal dilatation or surrounding inflammatory changes. Spleen: Normal in size without focal abnormality. Adrenals/Urinary Tract: Left renal cysts are present measuring up to 3.2 cm. There is no hydronephrosis. The adrenal glands bladder are within normal limits. Stomach/Bowel: Colon is diffusely distended with air-fluid levels. No transition point. No focal inflammation. The appendix is not seen. Small bowel and stomach are within normal limits. Vascular/Lymphatic: Aortic atherosclerosis. No enlarged abdominal or pelvic lymph nodes. Reproductive: Prostate is unremarkable. Other: There is trace ascites. There is diffuse abdominal wall edema. Musculoskeletal: No acute or significant osseous findings. IMPRESSION: 1. Colon is diffusely distended with air-fluid levels. No transition point. Findings are compatible with ileus. 2. Cirrhotic and fatty infiltration of the liver. 3. Trace ascites. 4. Body wall edema. 5. Left Bosniak I benign renal cyst measuring 3.2 cm. No follow-up imaging is recommended. JACR 2018 Feb; 264-273, Management of the Incidental Renal Mass on CT, [employer] 2021; 814-848, Bosniak Classification of Cystic Renal Masses, Version 2019. Aortic Atherosclerosis (ICD10-I70.0). Electronically Signed   By: Darliss Cheney M.D.   On: 05/05/2023 18:57   US Venous Img Lower Bilateral (DVT)  Result Date: 05/05/2023 CLINICAL DATA:  Bilateral lower extremity pain and edema for the past 2 weeks. History of smoking. Evaluate for DVT. EXAM: BILATERAL LOWER EXTREMITY VENOUS  DOPPLER ULTRASOUND TECHNIQUE: Gray-scale sonography with graded compression, as well as color Doppler and duplex ultrasound were performed to evaluate the lower extremity deep venous systems from the level of the common femoral vein and including the common femoral, femoral, profunda femoral, popliteal and calf veins including the posterior tibial, peroneal and gastrocnemius  veins when visible. The superficial great saphenous vein was also interrogated. Spectral Doppler was utilized to evaluate flow at rest and with distal augmentation maneuvers in the common femoral, femoral and popliteal veins. COMPARISON:  None Available. FINDINGS: RIGHT LOWER EXTREMITY Common Femoral Vein: No evidence of thrombus. Normal compressibility, respiratory phasicity and response to augmentation. Saphenofemoral Junction: No evidence of thrombus. Normal compressibility and flow on color Doppler imaging. Profunda Femoral Vein: No evidence of thrombus. Normal compressibility and flow on color Doppler imaging. Femoral Vein: No evidence of thrombus. Normal compressibility, respiratory phasicity and response to augmentation. Popliteal Vein: No evidence of thrombus. Normal compressibility, respiratory phasicity and response to augmentation. Rouleaux flow is noted within the left popliteal vein. Calf Veins: No evidence of thrombus. Normal compressibility and flow on color Doppler imaging. Superficial Great Saphenous Vein: No evidence of thrombus. Normal compressibility. Other Findings:  None. LEFT LOWER EXTREMITY Common Femoral Vein: No evidence of thrombus. Normal compressibility, respiratory phasicity and response to augmentation. Saphenofemoral Junction: No evidence of thrombus. Normal compressibility and flow on color Doppler imaging. Profunda Femoral Vein: No evidence of thrombus. Normal compressibility and flow on color Doppler imaging. Femoral Vein: No evidence of thrombus. Normal compressibility, respiratory phasicity and response to  augmentation. Popliteal Vein: No evidence of thrombus. Normal compressibility, respiratory phasicity and response to augmentation. Calf Veins: No evidence of thrombus. Normal compressibility and flow on color Doppler imaging. Superficial Great Saphenous Vein: No evidence of thrombus. Normal compressibility. Other Findings:  None. IMPRESSION: No evidence of DVT within either lower extremity. Electronically Signed   By: Simonne Come M.D.   On: 05/05/2023 16:40   DG Abd 2 Views  Result Date: 05/04/2023 CLINICAL DATA:  Abdominal distension EXAM: ABDOMEN - 2 VIEW COMPARISON:  05/01/2023, 04/29/2023 FINDINGS: Progressive gaseous distension of both large and small bowel loops throughout the abdomen. No gross free intraperitoneal air. IMPRESSION: Progressive gaseous distension of both large and small bowel loops throughout the abdomen. Pattern favors ileus. Obstruction not excluded. Electronically Signed   By: Duanne Guess D.O.   On: 05/04/2023 18:54   [place] ASCITES (ABDOMEN LIMITED)  Result Date: 05/04/2023 CLINICAL DATA:  Abdominal ascites. EXAM: LIMITED ABDOMEN ULTRASOUND FOR ASCITES TECHNIQUE: Limited ultrasound survey for ascites was performed in all four abdominal quadrants. COMPARISON:  CT of the abdomen 05/01/2023 FINDINGS: Small volume ascites is present in the right lower quadrant and at the midline. No significant fluid pockets are present to allow for paracentesis. IMPRESSION: Stable appearance of small volume ascites. Electronically Signed   By: Marin Roberts M.D.   On: 05/04/2023 12:41       LOS: 8 days    Kendell Bane, MD [facility] 05/06/2023, 12:19 PM   If 7PM-7AM, please contact night coverage [email or web address]

## 2023-05-06 NOTE — TOC Progression Note (Signed)
Transition of Care [facility]) - Progression Note    Patient Details  Name: Timothy [name] MRN: [id number] Date of Birth: 07/04/1971  [facility]) CM/SW Contact  Villa Herb, [place] Phone Number: 05/06/2023, 11:56 AM  Clinical Narrative:    Insurance auth restarted for SNF placement at [facility] at this time. TOC to follow.   Expected Discharge Plan: Skilled Nursing Facility Barriers to Discharge: Continued Medical Work up  Expected Discharge Plan and Services In-house Referral: Clinical Social Work     Living arrangements for the past 2 months: Single Family Home                                       Social Determinants of Health (SDOH) Interventions SDOH Screenings   Food Insecurity: No Food Insecurity (05/03/2023)  Housing: Low Risk  (05/03/2023)  Transportation Needs: No Transportation Needs (05/03/2023)  Utilities: Not At Risk (05/03/2023)  Tobacco Use: High Risk (05/03/2023)    Readmission Risk Interventions     No data to display

## 2023-05-07 DIAGNOSIS — E876 Hypokalemia: Secondary | ICD-10-CM | POA: Diagnosis not present

## 2023-05-07 DIAGNOSIS — F1010 Alcohol abuse, uncomplicated: Secondary | ICD-10-CM | POA: Diagnosis not present

## 2023-05-07 DIAGNOSIS — K7031 Alcoholic cirrhosis of liver with ascites: Secondary | ICD-10-CM | POA: Diagnosis not present

## 2023-05-07 LAB — COMPREHENSIVE METABOLIC PANEL
ALT: 36 U/L (ref 0–44)
AST: 97 U/L — ABNORMAL HIGH (ref 15–41)
Albumin: 4.2 g/dL (ref 3.5–5.0)
Alkaline Phosphatase: 112 U/L (ref 38–126)
Anion gap: 10 (ref 5–15)
BUN: 8 mg/dL (ref 6–20)
CO2: 23 mmol/L (ref 22–32)
Calcium: 9.5 mg/dL (ref 8.9–10.3)
Chloride: 99 mmol/L (ref 98–111)
Creatinine, Ser: 0.99 mg/dL (ref 0.61–1.24)
GFR, Estimated: >60 mL/min (ref >60)
Glucose, Bld: 111 mg/dL — ABNORMAL HIGH (ref 70–99)
Potassium: 3.6 mmol/L (ref 3.5–5.1)
Sodium: 132 mmol/L — ABNORMAL LOW (ref 135–145)
Total Bilirubin: 7.5 mg/dL — ABNORMAL HIGH (ref 0.3–1.2)
Total Protein: 7.7 g/dL (ref 6.5–8.1)

## 2023-05-07 LAB — PROTIME-INR
INR: 1.6 — ABNORMAL HIGH (ref 0.8–1.2)
Prothrombin Time: 19.3 s — ABNORMAL HIGH (ref 11.4–15.2)

## 2023-05-07 LAB — GLUCOSE, CAPILLARY: Glucose-Capillary: 109 mg/dL — ABNORMAL HIGH (ref 70–99)

## 2023-05-07 MED ORDER — ALPRAZOLAM 0.5 MG PO TABS
0.5000 mg | ORAL_TABLET | Freq: Three times a day (TID) | ORAL | Status: DC | PRN
  Administered 2023-05-09 – 2023-05-12 (×5): 0.5 mg via ORAL
  Filled 2023-05-07 (×5): qty 1

## 2023-05-07 NOTE — Care Management Important Message (Signed)
Important Message  Patient Details  Name: Timothy [name] MRN: [id number] Date of Birth: [birth year]   Medicare Important Message Given:  Yes     Corey Harold 05/07/2023, 1:05 PM

## 2023-05-07 NOTE — TOC Progression Note (Signed)
Transition of Care [facility]) - Progression Note    Patient Details  Name: Timothy [name] MRN: [id number] Date of Birth: 16-Aug-1971  Transition of Care [facility]) CM/SW Contact  Elliot Gault, LCSW Phone Number: 05/07/2023, 1:18 PM  Clinical Narrative:     TOC following. MD anticipating dc soon. Awaiting SNF auth. Updated by Devonne Doughty at [facility] that they have patients needing COVID isolation and no longer have a bed for this pt. Devonne Doughty states it is unknown when they will be able to accept.   Spoke with pt and family about alternative options. Awaiting their decision. Will update insurance auth once decision made and will continue to follow for dc planning.  Expected Discharge Plan: Skilled Nursing Facility Barriers to Discharge: Continued Medical Work up  Expected Discharge Plan and Services In-house Referral: Clinical Social Work     Living arrangements for the past 2 months: Single Family Home                                       Social Determinants of Health (SDOH) Interventions SDOH Screenings   Food Insecurity: No Food Insecurity (05/03/2023)  Housing: Low Risk  (05/03/2023)  Transportation Needs: No Transportation Needs (05/03/2023)  Utilities: Not At Risk (05/03/2023)  Tobacco Use: High Risk (05/03/2023)    Readmission Risk Interventions     No data to display

## 2023-05-07 NOTE — Plan of Care (Signed)
  Problem: Education: Goal: Knowledge of General Education information will improve Description Including pain rating scale, medication(s)/side effects and non-pharmacologic comfort measures Outcome: Progressing   Problem: Health Behavior/Discharge Planning: Goal: Ability to manage health-related needs will improve Outcome: Progressing   

## 2023-05-07 NOTE — Progress Notes (Signed)
Gastroenterology Progress Note   Referring Provider: No ref. provider found Primary Care Physician:  Elfredia Nevins, MD Primary Gastroenterologist:  Dr. Jena Gauss  Patient ID: Timothy [name]; [id number]; 05-12-1971   Subjective:    Tearful today. Did not rest/sleep well. Still having pain in the upper abd/flanks with movement and coughing. States pain related to extensive bruising from hard transfer from bed to CT table and back. Passing little stool but yesterday did have a large stool consistency of chocolate milk. Feels distended. No n/v. No melena, brbpr. He is frustrated because he has been unable to ambulate well for awhile. Has trouble going up and down stairs at home. Feels like his mobility is worse since in the hospital and has not really been able to participate well with PT. States PT said he would not be a candidate for rehab facility because he is not able to move about my himself. Per PT note, patient refused PT this morning.   Objective:   Vital signs in last 24 hours: Temp:  [97.7 F (36.5 C)-98.4 F (36.9 C)] 97.7 F (36.5 C) (08/22 0448) Pulse Rate:  [77-81] 81 (08/22 0448) Resp:  [18] 18 (08/22 0448) BP: (90-131)/(66-93) 131/93 (08/22 0448) SpO2:  [97 %-100 %] 100 % (08/22 0448) Weight:  [191.4 kg] 111.2 kg (08/22 0500) Last BM Date : 05/05/23 General:   Alert, tearful. Cooperative. NAD. Head:  Normocephalic and atraumatic. Eyes:  Sclera clear, + icterus.   Abdomen:  Distended, normal BS. Bruising in the flanks, left > right. Pitting edema in flanks. Tender to palpation. No guarding, and without rebound.   Extremities:  Without clubbing, deformity. 1+ bilateral edema. Neurologic:  Alert and  oriented x4;  grossly normal neurologically. No asterixis. Skin: as above Psych:  Alert and cooperative. Tearful.  Intake/Output from previous day: 08/21 0701 - 08/22 0700 In: 720 [P.O.:720] Out: 1000 [Urine:1000] Intake/Output this shift: No intake/output data  recorded.  Lab Results: CBC No results for input(s): "WBC", "HGB", "HCT", "MCV", "PLT" in the last 72 hours. BMET Recent Labs    05/05/23 0439 05/06/23 0443 05/07/23 0427  NA 128* 133* 132*  K 3.0* 3.1* 3.6  CL 95* 102 99  CO2 22 21* 23  GLUCOSE 94 89 111*  BUN 10 8 8   CREATININE 1.15 0.85 0.99  CALCIUM 8.3* 9.2 9.5   LFTs Recent Labs    05/05/23 0439 05/06/23 0443 05/07/23 0427  BILITOT 5.9* 7.3* 7.5*  ALKPHOS 142* 125 112  AST 114* 101* 97*  ALT 37 32 36  PROT 6.3* 6.7 7.7  ALBUMIN 2.2* 3.2* 4.2   Recent Labs    05/05/23 1713  LIPASE 76*   PT/INR Recent Labs    05/05/23 0439 05/06/23 0443 05/07/23 0427  LABPROT 18.1* 19.7* 19.3*  INR 1.5* 1.6* 1.6*         Imaging Studies: CT ABDOMEN PELVIS W CONTRAST  Result Date: 05/05/2023 CLINICAL DATA:  Abdominal pain and distention EXAM: CT ABDOMEN AND PELVIS WITH CONTRAST TECHNIQUE: Multidetector CT imaging of the abdomen and pelvis was performed using the standard protocol following bolus administration of intravenous contrast. RADIATION DOSE REDUCTION: This exam was performed according to the departmental dose-optimization program which includes automated exposure control, adjustment of the mA and/or kV according to patient size and/or use of iterative reconstruction technique. CONTRAST:  OMNIPAQUE IOHEXOL 300 MG/ML  SOLN COMPARISON:  Abdominal CT 05/01/2023. CT abdomen and pelvis 04/28/2023. FINDINGS: Lower chest: No acute abnormality. Hepatobiliary: Diffusely heterogeneous and hypoattenuating as  seen on the prior study compatible with fatty infiltration and likely cirrhosis. Gallbladder and bile ducts are within normal limits. Pancreas: Unremarkable. No pancreatic ductal dilatation or surrounding inflammatory changes. Spleen: Normal in size without focal abnormality. Adrenals/Urinary Tract: Left renal cysts are present measuring up to 3.2 cm. There is no hydronephrosis. The adrenal glands bladder are within  normal limits. Stomach/Bowel: Colon is diffusely distended with air-fluid levels. No transition point. No focal inflammation. The appendix is not seen. Small bowel and stomach are within normal limits. Vascular/Lymphatic: Aortic atherosclerosis. No enlarged abdominal or pelvic lymph nodes. Reproductive: Prostate is unremarkable. Other: There is trace ascites. There is diffuse abdominal wall edema. Musculoskeletal: No acute or significant osseous findings. IMPRESSION: 1. Colon is diffusely distended with air-fluid levels. No transition point. Findings are compatible with ileus. 2. Cirrhotic and fatty infiltration of the liver. 3. Trace ascites. 4. Body wall edema. 5. Left Bosniak I benign renal cyst measuring 3.2 cm. No follow-up imaging is recommended. JACR 2018 Feb; 264-273, Management of the Incidental Renal Mass on CT, [employer] 2021; [id number], Bosniak Classification of Cystic Renal Masses, Version 2019. Aortic Atherosclerosis (ICD10-I70.0). Electronically Signed   By: Darliss Cheney M.D.   On: 05/05/2023 18:57   US Venous Img Lower Bilateral (DVT)  Result Date: 05/05/2023 CLINICAL DATA:  Bilateral lower extremity pain and edema for the past 2 weeks. History of smoking. Evaluate for DVT. EXAM: BILATERAL LOWER EXTREMITY VENOUS DOPPLER ULTRASOUND TECHNIQUE: Gray-scale sonography with graded compression, as well as color Doppler and duplex ultrasound were performed to evaluate the lower extremity deep venous systems from the level of the common femoral vein and including the common femoral, femoral, profunda femoral, popliteal and calf veins including the posterior tibial, peroneal and gastrocnemius veins when visible. The superficial great saphenous vein was also interrogated. Spectral Doppler was utilized to evaluate flow at rest and with distal augmentation maneuvers in the common femoral, femoral and popliteal veins. COMPARISON:  None Available. FINDINGS: RIGHT LOWER EXTREMITY Common Femoral Vein: No  evidence of thrombus. Normal compressibility, respiratory phasicity and response to augmentation. Saphenofemoral Junction: No evidence of thrombus. Normal compressibility and flow on color Doppler imaging. Profunda Femoral Vein: No evidence of thrombus. Normal compressibility and flow on color Doppler imaging. Femoral Vein: No evidence of thrombus. Normal compressibility, respiratory phasicity and response to augmentation. Popliteal Vein: No evidence of thrombus. Normal compressibility, respiratory phasicity and response to augmentation. Rouleaux flow is noted within the left popliteal vein. Calf Veins: No evidence of thrombus. Normal compressibility and flow on color Doppler imaging. Superficial Great Saphenous Vein: No evidence of thrombus. Normal compressibility. Other Findings:  None. LEFT LOWER EXTREMITY Common Femoral Vein: No evidence of thrombus. Normal compressibility, respiratory phasicity and response to augmentation. Saphenofemoral Junction: No evidence of thrombus. Normal compressibility and flow on color Doppler imaging. Profunda Femoral Vein: No evidence of thrombus. Normal compressibility and flow on color Doppler imaging. Femoral Vein: No evidence of thrombus. Normal compressibility, respiratory phasicity and response to augmentation. Popliteal Vein: No evidence of thrombus. Normal compressibility, respiratory phasicity and response to augmentation. Calf Veins: No evidence of thrombus. Normal compressibility and flow on color Doppler imaging. Superficial Great Saphenous Vein: No evidence of thrombus. Normal compressibility. Other Findings:  None. IMPRESSION: No evidence of DVT within either lower extremity. Electronically Signed   By: Simonne Come M.D.   On: 05/05/2023 16:40   DG Abd 2 Views  Result Date: 05/04/2023 CLINICAL DATA:  Abdominal distension EXAM: ABDOMEN - 2 VIEW COMPARISON:  05/01/2023, 04/29/2023 FINDINGS:  Progressive gaseous distension of both large and small bowel loops throughout  the abdomen. No gross free intraperitoneal air. IMPRESSION: Progressive gaseous distension of both large and small bowel loops throughout the abdomen. Pattern favors ileus. Obstruction not excluded. Electronically Signed   By: Duanne Guess D.O.   On: 05/04/2023 18:54   [place] ASCITES (ABDOMEN LIMITED)  Result Date: 05/04/2023 CLINICAL DATA:  Abdominal ascites. EXAM: LIMITED ABDOMEN ULTRASOUND FOR ASCITES TECHNIQUE: Limited ultrasound survey for ascites was performed in all four abdominal quadrants. COMPARISON:  CT of the abdomen 05/01/2023 FINDINGS: Small volume ascites is present in the right lower quadrant and at the midline. No significant fluid pockets are present to allow for paracentesis. IMPRESSION: Stable appearance of small volume ascites. Electronically Signed   By: Marin Roberts M.D.   On: 05/04/2023 12:41   CT ABDOMEN WO CONTRAST  Result Date: 05/01/2023 CLINICAL DATA:  Inpatient. Extensive left flank bruising. Generalized abdominal pain. No known injury. EXAM: CT ABDOMEN WITHOUT CONTRAST TECHNIQUE: Multidetector CT imaging of the abdomen was performed following the standard protocol without IV contrast. RADIATION DOSE REDUCTION: This exam was performed according to the departmental dose-optimization program which includes automated exposure control, adjustment of the mA and/or kV according to patient size and/or use of iterative reconstruction technique. COMPARISON:  04/28/2023 CT abdomen/pelvis. FINDINGS: Lower chest: No significant pulmonary nodules or acute consolidative airspace disease. Coronary atherosclerosis. Hepatobiliary: Severe diffuse hepatic steatosis. Relative hypertrophy of caudate lobe with lobulated liver contour, cannot exclude cirrhosis. No discrete liver mass on this noncontrast exam. Somewhat contracted gallbladder with minimal layering hyperdense material, favoring vicarious excretion of contrast from recent IV contrast CT study, without definite gallbladder wall  thickening. No biliary ductal dilatation. Pancreas: Normal, with no mass or duct dilation. Spleen: Normal size. No mass. Adrenals/Urinary Tract: Normal adrenals. No renal stones. No hydronephrosis. Simple 3.4 cm anterior interpolar left renal cyst, for which no follow-up imaging is recommended. Stomach/Bowel: Normal non-distended stomach. Visualized small and large bowel is normal caliber, with no bowel wall thickening. Vascular/Lymphatic: Atherosclerotic nonaneurysmal abdominal aorta. Mild paraumbilical and ventral right abdominal varices. No pathologically enlarged lymph nodes in the abdomen. Other: Small volume perihepatic ascites. No focal fluid collection. No pneumoperitoneum. Mild-to-moderate anasarca is worsened. No evidence of retroperitoneal hematoma. Musculoskeletal: No aggressive appearing focal osseous lesions. Mild thoracolumbar spondylosis. IMPRESSION: 1. No evidence of retroperitoneal hematoma. 2. Severe diffuse hepatic steatosis. Relative hypertrophy of the caudate lobe with lobulated liver contour, cannot exclude cirrhosis. No discrete liver mass on this noncontrast exam. 3. Small volume perihepatic ascites. Mild paraumbilical and ventral right abdominal varices. Mild-to-moderate anasarca is worsened. 4. Coronary atherosclerosis. 5.  Aortic Atherosclerosis (ICD10-I70.0). Electronically Signed   By: Delbert Phenix M.D.   On: 05/01/2023 19:23   US ABDOMEN LIMITED WITH LIVER DOPPLER  Result Date: 04/30/2023 CLINICAL DATA:  52 year old male with history of cirrhosis. EXAM: DUPLEX ULTRASOUND OF LIVER TECHNIQUE: Color and duplex Doppler ultrasound was performed to evaluate the hepatic in-flow and out-flow vessels. COMPARISON:  CT abdomen pelvis from 04/28/2023, 02/01/2013 FINDINGS: Liver: Diffusely increased echogenicity. Glisson's capsule is invisible amongst the portal veins. Mildly nodular contour. No focal lesion, mass or intrahepatic biliary ductal dilatation. Main Portal Vein size: 0.7 cm Portal  Vein Velocities Main Prox:  51 cm/sec, hepatofugal Main Mid: 36 cm/sec, hepatofugal Main Dist:  38 cm/sec, hepatofugal Right: Not visualized. Left: 24 cm/sec, hepatofugal Hepatic Vein Velocities Right:  53 cm/sec, antegrade Middle:  36 cm/sec, antegrade Left:  32 cm/sec, antegrade IVC: Not visualized. Hepatic Artery Velocity:  173 cm/sec Splenic Vein Velocity:  28 cm/sec Spleen: 12.7 cm x 12.8 cm x 6.2 cm with a total volume of 529 cm^3 (411 cm^3 is upper limit normal) Portal Vein Occlusion/Thrombus: No Splenic Vein Occlusion/Thrombus: No Ascites: Trace Varices: None IMPRESSION: 1. Diffuse severe echogenicity throughout the hepatic parenchyma as could be seen with severe hepatic steatosis or in the acute setting of hepatitis. 2. Morphologic changes of cirrhosis with portal hypertension is evidence by mild splenomegaly, trace ascites, and hepatofugal flow the visualized portal system which is widely patent. Marliss Coots, MD [facility] Electronically Signed   By: Marliss Coots M.D.   On: 04/30/2023 15:35   DG Abd 1 View  Result Date: 04/29/2023 CLINICAL DATA:  Abdominal distension. EXAM: ABDOMEN - 1 VIEW COMPARISON:  CT abdomen/pelvis 04/27/2021 FINDINGS: Mildly prominent gaseous distension of colon with nonobstructive bowel gas pattern. No abnormal calcifications. Degenerative changes of the spine. IMPRESSION: Nonobstructive bowel gas pattern. Mildly prominent gaseous distension of colon. Electronically Signed   By: Feliberto Harts M.D.   On: 04/29/2023 16:58   [place] ASCITES (ABDOMEN LIMITED)  Result Date: 04/29/2023 CLINICAL DATA:  Ascites Interventional radiology consulted for paracentesis EXAM: LIMITED ABDOMEN ULTRASOUND FOR ASCITES TECHNIQUE: Limited ultrasound survey for ascites was performed in all four abdominal quadrants. COMPARISON:  CT abdomen pelvis 04/28/2023 FINDINGS: Sonographic evaluation of the abdomen demonstrates trace perihepatic  ascites which is insufficient for aspiration. IMPRESSION: Trace perihepatic ascites, insufficient for aspiration. No paracentesis was performed. Electronically Signed   By: Acquanetta Belling M.D.   On: 04/29/2023 09:56   CT ABDOMEN PELVIS W CONTRAST  Result Date: 04/28/2023 CLINICAL DATA:  Bowel obstruction suspected. Acute, nonlocalized abdominal pain. EXAM: CT ABDOMEN AND PELVIS WITH CONTRAST TECHNIQUE: Multidetector CT imaging of the abdomen and pelvis was performed using the standard protocol following bolus administration of intravenous contrast. RADIATION DOSE REDUCTION: This exam was performed according to the departmental dose-optimization program which includes automated exposure control, adjustment of the mA and/or kV according to patient size and/or use of iterative reconstruction technique. CONTRAST:  OMNIPAQUE IOHEXOL 300 MG/ML  SOLN COMPARISON:  09/24/2021 FINDINGS: Lower chest:  Coronary atherosclerosis. Hepatobiliary: Severe hepatic steatosis. Lobulated liver surface with newly enlarged caudate and further widened fissures.No evidence of biliary obstruction or stone. Pancreas: Unremarkable. Spleen: Unremarkable. Adrenals/Urinary Tract: Negative adrenals. No hydronephrosis or stone. Left renal hilar and cortical cysts with simple appearance measuring up to 3.2 cm at the renal hilum. Unremarkable bladder. Stomach/Bowel: No obstruction. No appendicitis other visible bowel inflammation. Vascular/Lymphatic: No acute vascular abnormality. Scattered atheromatous calcification. No mass or adenopathy. Reproductive:No pathologic findings. Other: New small volume ascites distributed throughout the abdomen including at the small umbilical hernia. Musculoskeletal: No acute abnormalities. Generalized degeneration with mild scoliosis. IMPRESSION: 1. Severe hepatic steatosis with progressive findings of cirrhosis since 2023. Small volume ascites is newly seen. 2. Extensive atherosclerosis. Electronically Signed    By: Tiburcio Pea M.D.   On: 04/28/2023 05:30  [2 weeks]  Assessment:   Augusto Bucko as well as a 52 year old male currently admitted with new findings of decompensated cirrhosis and abdominal pain/distention.   Abdominal pain and distention: Ongoing, passing some stool. Reports large liquid stool yesterday. KUB Monday with progressive gas distention of both large and small bowel loops throughout the abdomen. He has been evaluated for paracentesis twice this admission but not enough fluid to proceed.  Repeat CT abdomen pelvis 8/20 showed: Diffusely distended with air-fluid levels.  No transition point.  Findings compatible  with ileus.  Some trace ascites.  Body wall edema.    Notably he developed some bruising and redness along his flanks this admission.  Reportedly occurred during transfer for CT on 8/13 as he tells me he was drug over the side rails of stretcher during transfer.  Endorsing some pain in these areas.  Hospitalist is aware.  Repeat CTs 8/16 and 8/20 with no evidence of retroperitoneal hematoma.   He is on lactulose 30g BID and linzess daily. Also on senokot-S one at bedtime. Receiving dilaudid 2-3 times per day.   Decompensated cirrhosis: MELD 3.0 is 21. There has been concern for tense ascites in light of abdominal distention, however ultrasound x 2 with minimal ascites, not enough for paracentesis.  Liver Doppler without any evidence of PVT.  Pitting edema in LEs (minimal) though he does have more pitting edema in his flank/lower abdomen and upper thighs.  Previously on Lasix 40 mg outpatient but this was held on admission due to hypokalemia.  Lasix 40mg  bid and spironolactone 50 mg daily at this time.   Potassium and sodium improved Creatinine remained stable. He received IV albumin, completed today.    Anemia: Hemoglobin 10-11 range this admission.  No overt GI bleeding.  Overdue for EGD and colonoscopy which can both be completed as outpatient unless he develops overt GI  bleeding or significant decline in hemoglobin.  EGD in January 2024 for hematemesis with grade D reflux esophagitis with ulceration, recently bleeding gastritis biopsied.  No esophageal varices.  Recommend repeat EGD 8 weeks thereafter though he was lost to follow-up for this.  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 Will need to continue with PPI BID.    Elevated LFTs/Alcoholic Hepatitis: DF 38.1 yesterday with PT control of 13. Started on prednisolone yesterday. Tbili from 7.3 to 7.5 over night. INR stable.       Plan:   Continue prednisolone, follow response. If no improvement in Tbili and DF at one week, would recommend stopping. Otherwise, consider 40mg  daily for 28 days, then taper.  Continue current bowel regimen. Limit opiates. Encouraged patient to participate in PT. Continue diuretics, titrate as electrolytes tolerate.   LOS: 9 days   Leanna Battles. Dixon [facility] [phone] 8/22/202411:10 AM

## 2023-05-07 NOTE — Progress Notes (Signed)
PROGRESS NOTE    [name]   GNF:[id number] DOB: 01-Jun-1971  DOA: 04/28/2023 Date of Service: 05/07/23 PCP: Elfredia Nevins, MD     Brief Narrative / Hospital Course:  Timothy [name] is a 52 year old male with history of HTN, Barrett esophagus, PUD, WPW status post ablation, NICM, alcohol abuse, and liver disease who presents with worsening abdominal distention and pain. 08/13: to ED. Mult electrolyte derangement, low Plt. CT of the abdomen pelvis with IV contrast showed presence of new cirrhotic changes with small volume of ascites. Admitted w/ decompensated cirrhosis/ascites. GI consult. Plan paracentesis. Plan start furosemide/spironolactone when electrolytes are improved 08/14: Ultrasound today reveals trace perihepatic ascites insufficient for tap. Subsequent plain films revealed nonspecific bowel gas pattern with some increase in colonic air. No evidence of obstruction, impaction or free air. Plan liver doppler.  08/15: Liver doppler done. Add spironolactone 100mg  daily/continue lasix 40mg  daily. More aggressive bowel regimen given no signs of obstruction and narcotic use. Will start Linzess daily. Can titrate accordingly.  08/16: Liver Doppler studies with severe steatosis, portal hypertension and patent portal vein. Start lactulose 10 g twice daily. Holding diuretics. Repeated CT to eval L flank bruise and drop Hgb, no hematoma 08/17: still off lasix/spiro, increase lactulose to 20 bid. Stable to transfer out of stepdown. Follow GI recs - consider repeat US / attempt paracentesis?  08/18: BP and K improved, restart lasix/spiro today. Of note, pt reported concern for when he was down for his MRI, he was moved roughly by staff and this maybe caused the bruising,   05/04/2023: Improving, low sodium, potassium..  Sodium potassium repleted 05/05/2023: Reporting of increased abdominal pain, distention, feeling weak, constipated, 05/06/2023: Complain of generalized weaknesses,  generalized aches and pain, still having abdominal distention 05/07/2023: Lethargic, complaining of abdominal pain, hemodynamically stable, much improved labs -pending insurance authorization and approval for discharge to SNF   Subjective / Brief ROS:  The patient was seen and examined this morning, complaining of generalized weakness, was having abdominal pain, otherwise hemodynamically stable, improved labs  Patient is aware and accepted to go to SNF pending authorization       ASSESSMENT & PLAN:   Principal Problem:   Abdominal pain Active Problems:   Alcoholic cirrhosis (HCC)   Anxiety state   Barrett's esophagus without dysplasia   Hypomagnesemia   Essential hypertension   Hypokalemia   History of Wolff-Parkinson-White syndrome   GERD (gastroesophageal reflux disease)   Acute CHF (congestive heart failure) (HCC)   ETOH abuse   Dyslipidemia   Elevated LFTs   Alcohol withdrawal/DTs  Decompensated Alcoholic Liver Cirrhosis  / Generalized Abdominal pain Progressive alcoholic liver cirrhosis and severe steatosis resulting in portal HTN, ascites, abdominal distention.   Hyponatremia d/t liver disease, EtOH Thrombocytopenia d/t liver disease  -Stable, continue to have abdominal distention, with mild discomfort (Repeat ultrasound, and CT scan did not reveal enough fluid for paracentesis)  MELD score of 25, Maddrey DF 30.7-not a candidate for steroid. Liver Doppler without evidence of PVT.  Not enough ascites for paracentesis.... Using ultrasound and evaluation of ascites  No signs of SBP.  GI following -obtaining another CT scan of abdomen today Lasix  40 mg po daily Spiro at 50 mg po daily  Lactulose 20 bid - will reduce back to 10, he's reporting liquid stool  2g sodium diet  Absolute EtOH cessation  Needs outpatient colonoscopy for surveillance purposes. Can consider EGD at the same time to evaluate for varices as well as to  screen for Barrett's esophagus.   Obtaining CT scan of abdomenDiffuse colonic distention with air-fluid, possible ileus, liver cirrhosis and fatty infiltration, trace ascites, body wall edema, benign renal cyst 3.2 cm:   Constipation  Complicated by opiate dependence, ascites  Lactulose as above  BM x 2 in past 24 hours Linzess 290 mcg daily   Anemia stable no evidence of active GI bleeding.  Previous EGD with ulcerative esophagitis.  Continue to monitor hemoglobin no plans for inpatient endoscopic evaluation  Continue PPI twice daily.   Bilateral flank bruising No hematoma on CT  Monitor on physical exam Monitor HH/CBC   Hypomagnesemia /Hypokalemia/hyponatremia Replace as needed Much improved lecture lites Monitor BMP, Mg Continue sodium supplements   Barrett's esophagus without dysplasia GERD Continue PPI twice daily.   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 echo 12/19/2020:  LVEF 45-50%, mildly decreased LV function moderate LVH, grade 1 diastolic dysfunction impaired relaxation Monitor I's and O's Limit IV fluid resuscitation  History of Wolff-Parkinson-White syndrome Status post ablation, monitoring  H/o hypertension -hypotensive Was persistently hypotensive, medications were held, BP is improving  Dyslipidemia Holding statins due to elevated LFTs due to alcohol use abuse liver cirrhosis     Consultants:  Gastroenterology   Procedures: None    DVT prophylaxis: SCD d/t low Plt Pertinent IV  fluids/nutrition: no continuous IV fluids  Central lines / invasive devices: none  Code Status: FULL CODE ACP documentation reviewed: 05/04/23 none on file   Current Admission Status: inpatient TOC needs / Dispo plan: SNF rehab in 28-48 hrs    Barriers to discharge / significant pending items:  Pending insurance authorization     Family Communication: none at this time     Objective Findings:  Vitals:   05/06/23 1516 05/06/23 1955 05/07/23 0448 05/07/23 0500  BP: 90/66 118/70 (!) 131/93   Pulse: 77 81 81   Resp: 18 18 18    Temp: 98.4 F (36.9 C) 98.4 F (36.9 C) 97.7 F (36.5 C)   TempSrc: Oral Oral Oral   SpO2: 98% 97% 100%   Weight:    111.2 kg  Height:        Intake/Output Summary (Last 24 hours) at 05/07/2023 1246 Last data filed at 05/06/2023 1900 Gross per 24 hour  Intake 480 ml  Output 1000 ml  Net -520 ml   Filed Weights   05/05/23 0615 05/06/23 0500 05/07/23 0500  Weight: 110.8 kg 111.8 kg 111.2 kg         General:  AAO x 3,  cooperative, no distress;   HEENT:  Normocephalic, PERRL, otherwise with in Normal limits   Neuro:  CNII-XII intact. , normal motor and sensation, reflexes intact   Lungs:   Clear to auscultation BL, Respirations unlabored,  No wheezes / crackles  Cardio:    S1/S2, RRR, No murmure, No Rubs or Gallops   Abdomen:  Soft, distended, non-tender, bowel sounds active all four quadrants, no guarding or peritoneal signs.  Muscular  skeletal:  Limited exam -global generalized  weaknesses - in bed, able to move all 4 extremities,   2+ pulses,  symmetric, No pitting edema  Skin:  Dry, warm to touch, negative for any Rashes,  Wounds: Please see nursing documentation       Scheduled Medications:   carvedilol  6.25 mg Oral BID WC   folic acid  1 mg Oral Daily   furosemide  40 mg Oral BID   gabapentin  200 mg Oral TID   lactulose  30 g Oral BID   linaclotide  290 mcg Oral QAC breakfast   multivitamin with minerals  1 tablet  Oral Daily   nicotine  21 mg Transdermal Daily   pantoprazole  40 mg Oral Daily   prednisoLONE  40 mg Oral Daily   senna-docusate  1 tablet Oral QHS   sodium chloride flush  3 mL Intravenous Q12H   sodium chloride  1 g Oral BID WC   spironolactone  50 mg Oral Daily   sucralfate  1 g Oral BID   thiamine  100 mg Oral Daily   Or   thiamine  100 mg Intravenous Daily   traZODone  100 mg Oral QHS    Continuous Infusions:  sodium chloride 10 mL/hr at 05/05/23 1652    PRN Medications:  sodium chloride, guaiFENesin-dextromethorphan, HYDROmorphone (DILAUDID) injection, hydrOXYzine, ipratropium, levalbuterol, ondansetron **OR** ondansetron (ZOFRAN) IV, mouth rinse, oxyCODONE, sodium phosphate, zolpidem  Antimicrobials from admission:  Anti-infectives (From admission, onward)    None       Data Reviewed:  I have personally reviewed the following...  CBC: Recent Labs  Lab 05/01/23 0446 05/01/23 1439 05/02/23 0346 05/03/23 0433 05/04/23 0530  WBC 7.5 8.2 6.3 6.9 7.0  HGB 10.2* 10.1* 9.9* 10.4* 11.1*  HCT 30.5* 30.1* 29.6* 30.9* 33.3*  MCV 98.1 97.7 97.4 98.1 97.9  PLT 120* 124* 119* 122* 142*   Basic Metabolic Panel: Recent Labs  Lab 05/03/23 0433 05/04/23 0530 05/05/23 0439 05/06/23 0443 05/07/23 0427  NA 128* 129* 128* 133* 132*  K 3.5 2.9* 3.0* 3.1* 3.6  CL 92* 94* 95* 102 99  CO2 24 24 22  21* 23  GLUCOSE 77 78 94 89 111*  BUN 9 9 10 8 8   CREATININE 1.06 1.07 1.15 0.85 0.99  CALCIUM 8.5* 8.6* 8.3* 9.2 9.5  MG  --   --   --  1.8  --    GFR: Estimated Creatinine Clearance: 109.8 mL/min (by C-G formula based on SCr of 0.99 mg/dL). Liver Function Tests: Recent Labs  Lab 05/03/23 0433 05/04/23 0530 05/05/23 0439 05/06/23 0443 05/07/23 0427  AST 137* 137* 114* 101* 97*  ALT 36 40 37 32 36  ALKPHOS 175* 173* 142* 125 112  BILITOT 7.8* 7.5* 5.9* 7.3* 7.5*  PROT 6.4* 7.1 6.3* 6.7 7.7  ALBUMIN 2.3* 2.4* 2.2* 3.2* 4.2   Recent Labs  Lab 05/05/23 1713   LIPASE 76*    No results for input(s): "AMMONIA" in the last 168 hours. Coagulation Profile: Recent Labs  Lab 05/03/23 0433 05/04/23 0530 05/05/23 0439 05/06/23 0443 05/07/23 0427  INR 1.5* 1.4* 1.5* 1.6* 1.6*    CBG: Recent Labs  Lab 05/03/23 0803 05/04/23 0755 05/05/23 0725 05/06/23 0751 05/07/23 0801  GLUCAP 73 90 82 70 109*    Most Recent Urinalysis On File:     Component Value Date/Time   COLORURINE YELLOW 04/11/2016 1044   APPEARANCEUR CLEAR 04/11/2016 1044   LABSPEC 1.011 04/11/2016 1044   PHURINE 7.5 04/11/2016 1044   GLUCOSEU  NEGATIVE 04/11/2016 1044   HGBUR NEGATIVE 04/11/2016 1044   BILIRUBINUR NEGATIVE 04/11/2016 1044   KETONESUR NEGATIVE 04/11/2016 1044   PROTEINUR NEGATIVE 04/11/2016 1044   UROBILINOGEN 0.2 10/29/2014 0937   NITRITE NEGATIVE 04/11/2016 1044   LEUKOCYTESUR NEGATIVE 04/11/2016 1044   Sepsis Labs: @LABRCNTIP (procalcitonin:4,lacticidven:4) Microbiology: Recent Results (from the past 240 hour(s))  MRSA Next Gen by PCR, Nasal     Status: None   Collection Time: 04/29/23 12:25 AM   Specimen: Nasal Mucosa; Nasal Swab  Result Value Ref Range Status   MRSA by PCR Next Gen NOT DETECTED NOT DETECTED Final    Comment: (NOTE) The GeneXpert MRSA Assay (FDA approved for NASAL specimens only), is one component of a comprehensive MRSA colonization surveillance program. It is not intended to diagnose MRSA infection nor to guide or monitor treatment for MRSA infections. Test performance is not FDA approved in patients less than 36 years old. Performed at [facility], [address] [place], [place] [address]       Radiology Studies last 3 days: CT ABDOMEN PELVIS W CONTRAST  Result Date: 05/05/2023 CLINICAL DATA:  Abdominal pain and distention EXAM: CT ABDOMEN AND PELVIS WITH CONTRAST TECHNIQUE: Multidetector CT imaging of the abdomen and pelvis was performed using the standard protocol following bolus administration of intravenous  contrast. RADIATION DOSE REDUCTION: This exam was performed according to the departmental dose-optimization program which includes automated exposure control, adjustment of the mA and/or kV according to patient size and/or use of iterative reconstruction technique. CONTRAST:  OMNIPAQUE IOHEXOL 300 MG/ML  SOLN COMPARISON:  Abdominal CT 05/01/2023. CT abdomen and pelvis 04/28/2023. FINDINGS: Lower chest: No acute abnormality. Hepatobiliary: Diffusely heterogeneous and hypoattenuating as seen on the prior study compatible with fatty infiltration and likely cirrhosis. Gallbladder and bile ducts are within normal limits. Pancreas: Unremarkable. No pancreatic ductal dilatation or surrounding inflammatory changes. Spleen: Normal in size without focal abnormality. Adrenals/Urinary Tract: Left renal cysts are present measuring up to 3.2 cm. There is no hydronephrosis. The adrenal glands bladder are within normal limits. Stomach/Bowel: Colon is diffusely distended with air-fluid levels. No transition point. No focal inflammation. The appendix is not seen. Small bowel and stomach are within normal limits. Vascular/Lymphatic: Aortic atherosclerosis. No enlarged abdominal or pelvic lymph nodes. Reproductive: Prostate is unremarkable. Other: There is trace ascites. There is diffuse abdominal wall edema. Musculoskeletal: No acute or significant osseous findings. IMPRESSION: 1. Colon is diffusely distended with air-fluid levels. No transition point. Findings are compatible with ileus. 2. Cirrhotic and fatty infiltration of the liver. 3. Trace ascites. 4. Body wall edema. 5. Left Bosniak I benign renal cyst measuring 3.2 cm. No follow-up imaging is recommended. JACR 2018 Feb; 264-273, Management of the Incidental Renal Mass on CT, [employer] 2021; [phone], Bosniak Classification of Cystic Renal Masses, Version 2019. Aortic Atherosclerosis (ICD10-I70.0). Electronically Signed   By: Darliss Cheney M.D.   On: 05/05/2023 18:57    US Venous Img Lower Bilateral (DVT)  Result Date: 05/05/2023 CLINICAL DATA:  Bilateral lower extremity pain and edema for the past 2 weeks. History of smoking. Evaluate for DVT. EXAM: BILATERAL LOWER EXTREMITY VENOUS DOPPLER ULTRASOUND TECHNIQUE: Gray-scale sonography with graded compression, as well as color Doppler and duplex ultrasound were performed to evaluate the lower extremity deep venous systems from the level of the common femoral vein and including the common femoral, femoral, profunda femoral, popliteal and calf veins including the posterior tibial, peroneal and gastrocnemius veins when visible. The superficial great saphenous vein was also interrogated. Spectral  Doppler was utilized to evaluate flow at rest and with distal augmentation maneuvers in the common femoral, femoral and popliteal veins. COMPARISON:  None Available. FINDINGS: RIGHT LOWER EXTREMITY Common Femoral Vein: No evidence of thrombus. Normal compressibility, respiratory phasicity and response to augmentation. Saphenofemoral Junction: No evidence of thrombus. Normal compressibility and flow on color Doppler imaging. Profunda Femoral Vein: No evidence of thrombus. Normal compressibility and flow on color Doppler imaging. Femoral Vein: No evidence of thrombus. Normal compressibility, respiratory phasicity and response to augmentation. Popliteal Vein: No evidence of thrombus. Normal compressibility, respiratory phasicity and response to augmentation. Rouleaux flow is noted within the left popliteal vein. Calf Veins: No evidence of thrombus. Normal compressibility and flow on color Doppler imaging. Superficial Great Saphenous Vein: No evidence of thrombus. Normal compressibility. Other Findings:  None. LEFT LOWER EXTREMITY Common Femoral Vein: No evidence of thrombus. Normal compressibility, respiratory phasicity and response to augmentation. Saphenofemoral Junction: No evidence of thrombus. Normal compressibility and flow on color  Doppler imaging. Profunda Femoral Vein: No evidence of thrombus. Normal compressibility and flow on color Doppler imaging. Femoral Vein: No evidence of thrombus. Normal compressibility, respiratory phasicity and response to augmentation. Popliteal Vein: No evidence of thrombus. Normal compressibility, respiratory phasicity and response to augmentation. Calf Veins: No evidence of thrombus. Normal compressibility and flow on color Doppler imaging. Superficial Great Saphenous Vein: No evidence of thrombus. Normal compressibility. Other Findings:  None. IMPRESSION: No evidence of DVT within either lower extremity. Electronically Signed   By: Simonne Come M.D.   On: 05/05/2023 16:40   DG Abd 2 Views  Result Date: 05/04/2023 CLINICAL DATA:  Abdominal distension EXAM: ABDOMEN - 2 VIEW COMPARISON:  05/01/2023, 04/29/2023 FINDINGS: Progressive gaseous distension of both large and small bowel loops throughout the abdomen. No gross free intraperitoneal air. IMPRESSION: Progressive gaseous distension of both large and small bowel loops throughout the abdomen. Pattern favors ileus. Obstruction not excluded. Electronically Signed   By: Duanne Guess D.O.   On: 05/04/2023 18:54   [place] ASCITES (ABDOMEN LIMITED)  Result Date: 05/04/2023 CLINICAL DATA:  Abdominal ascites. EXAM: LIMITED ABDOMEN ULTRASOUND FOR ASCITES TECHNIQUE: Limited ultrasound survey for ascites was performed in all four abdominal quadrants. COMPARISON:  CT of the abdomen 05/01/2023 FINDINGS: Small volume ascites is present in the right lower quadrant and at the midline. No significant fluid pockets are present to allow for paracentesis. IMPRESSION: Stable appearance of small volume ascites. Electronically Signed   By: Marin Roberts M.D.   On: 05/04/2023 12:41       LOS: 9 days    Kendell Bane, MD [facility] 05/07/2023, 12:46 PM   Time spent 35 minutes -seeing evaluating patient, reviewing all labs, medical records, drawn plan  of care, adjusting and titrating medication  if 7PM-7AM, please contact night coverage [email or web address]

## 2023-05-07 NOTE — Progress Notes (Signed)
PT Cancellation Note  Patient Details Name: Timothy [name] MRN: [id number] DOB: 1970/09/20   Cancelled Treatment:    Reason Eval/Treat Not Completed: Patient declined, no reason specified;Pain limiting ability to participate.  Therapist strongly encouraged pt to get up at least to the chair, pt refused stating he just was not able.  Therapist states that the only way to begin feeling stronger was to start moving but pt continued to refuse.   Virgina Organ, PT CLT [phone]  05/07/2023, 11:56 AM

## 2023-05-08 ENCOUNTER — Inpatient Hospital Stay (HOSPITAL_COMMUNITY): Payer: 59

## 2023-05-08 DIAGNOSIS — K567 Ileus, unspecified: Secondary | ICD-10-CM

## 2023-05-08 DIAGNOSIS — R1084 Generalized abdominal pain: Secondary | ICD-10-CM | POA: Diagnosis not present

## 2023-05-08 DIAGNOSIS — R7989 Other specified abnormal findings of blood chemistry: Secondary | ICD-10-CM | POA: Diagnosis not present

## 2023-05-08 DIAGNOSIS — K7031 Alcoholic cirrhosis of liver with ascites: Secondary | ICD-10-CM | POA: Diagnosis not present

## 2023-05-08 DIAGNOSIS — F1010 Alcohol abuse, uncomplicated: Secondary | ICD-10-CM | POA: Diagnosis not present

## 2023-05-08 DIAGNOSIS — E876 Hypokalemia: Secondary | ICD-10-CM | POA: Diagnosis not present

## 2023-05-08 LAB — COMPREHENSIVE METABOLIC PANEL
ALT: 41 U/L (ref 0–44)
AST: 107 U/L — ABNORMAL HIGH (ref 15–41)
Albumin: 4 g/dL (ref 3.5–5.0)
Alkaline Phosphatase: 106 U/L (ref 38–126)
Anion gap: 10 (ref 5–15)
BUN: 11 mg/dL (ref 6–20)
CO2: 21 mmol/L — ABNORMAL LOW (ref 22–32)
Calcium: 9.4 mg/dL (ref 8.9–10.3)
Chloride: 101 mmol/L (ref 98–111)
Creatinine, Ser: 1.04 mg/dL (ref 0.61–1.24)
GFR, Estimated: >60 mL/min (ref >60)
Glucose, Bld: 111 mg/dL — ABNORMAL HIGH (ref 70–99)
Potassium: 2.9 mmol/L — ABNORMAL LOW (ref 3.5–5.1)
Sodium: 132 mmol/L — ABNORMAL LOW (ref 135–145)
Total Bilirubin: 5.5 mg/dL — ABNORMAL HIGH (ref 0.3–1.2)
Total Protein: 7.5 g/dL (ref 6.5–8.1)

## 2023-05-08 LAB — PROTIME-INR
INR: 1.7 — ABNORMAL HIGH (ref 0.8–1.2)
Prothrombin Time: 19.9 s — ABNORMAL HIGH (ref 11.4–15.2)

## 2023-05-08 LAB — GLUCOSE, CAPILLARY: Glucose-Capillary: 82 mg/dL (ref 70–99)

## 2023-05-08 MED ORDER — MAGNESIUM SULFATE 2 GM/50ML IV SOLN
2.0000 g | Freq: Once | INTRAVENOUS | Status: AC
  Administered 2023-05-08: 2 g via INTRAVENOUS
  Filled 2023-05-08: qty 50

## 2023-05-08 MED ORDER — FUROSEMIDE 40 MG PO TABS: 40.0000 mg | ORAL_TABLET | Freq: Two times a day (BID) | ORAL | Status: DC

## 2023-05-08 MED ORDER — POTASSIUM CHLORIDE 10 MEQ/100ML IV SOLN
10.0000 meq | INTRAVENOUS | Status: DC
Start: 2023-05-08 — End: 2023-05-08

## 2023-05-08 MED ORDER — POLYETHYLENE GLYCOL 3350 17 G PO PACK
17.0000 g | PACK | Freq: Three times a day (TID) | ORAL | Status: DC
  Administered 2023-05-08 – 2023-05-12 (×11): 17 g via ORAL
  Filled 2023-05-08 (×10): qty 1

## 2023-05-08 MED ORDER — POTASSIUM CHLORIDE CRYS ER 20 MEQ PO TBCR
20.0000 meq | EXTENDED_RELEASE_TABLET | Freq: Once | ORAL | Status: AC
  Administered 2023-05-08: 20 meq via ORAL
  Filled 2023-05-08: qty 1

## 2023-05-08 MED ORDER — POTASSIUM CHLORIDE 10 MEQ/100ML IV SOLN
10.0000 meq | INTRAVENOUS | Status: AC
  Administered 2023-05-08 (×5): 10 meq via INTRAVENOUS
  Filled 2023-05-08 (×5): qty 100

## 2023-05-08 MED ORDER — SPIRONOLACTONE 25 MG PO TABS: 50.0000 mg | ORAL_TABLET | Freq: Every day | ORAL | Status: DC

## 2023-05-08 NOTE — Progress Notes (Signed)
Gastroenterology Progress Note   Referring Provider: No ref. provider found Primary Care Physician:  Elfredia Nevins, MD Primary Gastroenterologist:  Dr. Jena Gauss  Patient ID: Timothy [name]; [id number]; 19-Apr-1971    Subjective   Yesterday vomited about 10 minutes after he ate. No BM yest today. Little squirt of stool overnight per patient. Still having abdominal pain espiecially to bruising around right side of abdomen. Still remains nauseas. He does report some intermittent confusion.  Sitting up in chair during exam.  Objective   Vital signs in last 24 hours Temp:  [97.2 F (36.2 C)-98.5 F (36.9 C)] 97.2 F (36.2 C) (08/23 0409) Pulse Rate:  [74-77] 74 (08/23 0409) Resp:  [16-18] 18 (08/23 0409) BP: (117-136)/(81-98) 129/89 (08/23 0409) SpO2:  [94 %-96 %] 96 % (08/23 0409) Weight:  [107.5 kg] 107.5 kg (08/23 0500) Last BM Date : 05/07/23  Physical Exam General:   Alert and oriented, pleasant Head:  Normocephalic and atraumatic. Eyes:  No icterus, sclera clear. Conjuctiva pink.  Mouth:  Without lesions, mucosa pink and moist.  Neck:  Supple, without thyromegaly or masses.   Abdomen:  Bowel sounds present. Distended abdomen. Umbilical hernia present. Ttp throughout abdomen. UTA for HSM. Extremities:  Without clubbing or edema to lower extremities. dependent non pitting edema to flank and hips.  Neurologic:  Alert and  oriented x4;  grossly normal neurologically. Intermittent confusion . No asterixis Skin:  Warm and dry. Bruising to right abdomen and LUQ/left flank  Psych:  Alert and cooperative. Normal mood and affect.  Intake/Output from previous day: 08/22 0701 - 08/23 0700 In: 360 [P.O.:360] Out: 1450 [Urine:1450] Intake/Output this shift: Total I/O In: 120 [P.O.:120] Out: 500 [Urine:500]  Lab Results  No results for input(s): "WBC", "HGB", "HCT", "PLT" in the last 72 hours. BMET Recent Labs    05/06/23 0443 05/07/23 0427 05/08/23 0438  NA 133* 132* 132*   K 3.1* 3.6 2.9*  CL 102 99 101  CO2 21* 23 21*  GLUCOSE 89 111* 111*  BUN 8 8 11   CREATININE 0.85 0.99 1.04  CALCIUM 9.2 9.5 9.4   LFT Recent Labs    05/06/23 0443 05/07/23 0427 05/08/23 0438  PROT 6.7 7.7 7.5  ALBUMIN 3.2* 4.2 4.0  AST 101* 97* 107*  ALT 32 36 41  ALKPHOS 125 112 106  BILITOT 7.3* 7.5* 5.5*   PT/INR Recent Labs    05/07/23 0427 05/08/23 0438  LABPROT 19.3* 19.9*  INR 1.6* 1.7*   Hepatitis Panel No results for input(s): "HEPBSAG", "HCVAB", "HEPAIGM", "HEPBIGM" in the last 72 hours.  Studies/Results CT ABDOMEN PELVIS W CONTRAST  Result Date: 05/05/2023 CLINICAL DATA:  Abdominal pain and distention EXAM: CT ABDOMEN AND PELVIS WITH CONTRAST TECHNIQUE: Multidetector CT imaging of the abdomen and pelvis was performed using the standard protocol following bolus administration of intravenous contrast. RADIATION DOSE REDUCTION: This exam was performed according to the departmental dose-optimization program which includes automated exposure control, adjustment of the mA and/or kV according to patient size and/or use of iterative reconstruction technique. CONTRAST:  OMNIPAQUE IOHEXOL 300 MG/ML  SOLN COMPARISON:  Abdominal CT 05/01/2023. CT abdomen and pelvis 04/28/2023. FINDINGS: Lower chest: No acute abnormality. Hepatobiliary: Diffusely heterogeneous and hypoattenuating as seen on the prior study compatible with fatty infiltration and likely cirrhosis. Gallbladder and bile ducts are within normal limits. Pancreas: Unremarkable. No pancreatic ductal dilatation or surrounding inflammatory changes. Spleen: Normal in size without focal abnormality. Adrenals/Urinary Tract: Left renal cysts are present measuring up to  3.2 cm. There is no hydronephrosis. The adrenal glands bladder are within normal limits. Stomach/Bowel: Colon is diffusely distended with air-fluid levels. No transition point. No focal inflammation. The appendix is not seen. Small bowel and stomach are  within normal limits. Vascular/Lymphatic: Aortic atherosclerosis. No enlarged abdominal or pelvic lymph nodes. Reproductive: Prostate is unremarkable. Other: There is trace ascites. There is diffuse abdominal wall edema. Musculoskeletal: No acute or significant osseous findings. IMPRESSION: 1. Colon is diffusely distended with air-fluid levels. No transition point. Findings are compatible with ileus. 2. Cirrhotic and fatty infiltration of the liver. 3. Trace ascites. 4. Body wall edema. 5. Left Bosniak I benign renal cyst measuring 3.2 cm. No follow-up imaging is recommended. JACR 2018 Feb; 264-273, Management of the Incidental Renal Mass on CT, [employer] 2021; [id number], Bosniak Classification of Cystic Renal Masses, Version 2019. Aortic Atherosclerosis (ICD10-I70.0). Electronically Signed   By: Darliss Cheney M.D.   On: 05/05/2023 18:57   US Venous Img Lower Bilateral (DVT)  Result Date: 05/05/2023 CLINICAL DATA:  Bilateral lower extremity pain and edema for the past 2 weeks. History of smoking. Evaluate for DVT. EXAM: BILATERAL LOWER EXTREMITY VENOUS DOPPLER ULTRASOUND TECHNIQUE: Gray-scale sonography with graded compression, as well as color Doppler and duplex ultrasound were performed to evaluate the lower extremity deep venous systems from the level of the common femoral vein and including the common femoral, femoral, profunda femoral, popliteal and calf veins including the posterior tibial, peroneal and gastrocnemius veins when visible. The superficial great saphenous vein was also interrogated. Spectral Doppler was utilized to evaluate flow at rest and with distal augmentation maneuvers in the common femoral, femoral and popliteal veins. COMPARISON:  None Available. FINDINGS: RIGHT LOWER EXTREMITY Common Femoral Vein: No evidence of thrombus. Normal compressibility, respiratory phasicity and response to augmentation. Saphenofemoral Junction: No evidence of thrombus. Normal compressibility and flow on  color Doppler imaging. Profunda Femoral Vein: No evidence of thrombus. Normal compressibility and flow on color Doppler imaging. Femoral Vein: No evidence of thrombus. Normal compressibility, respiratory phasicity and response to augmentation. Popliteal Vein: No evidence of thrombus. Normal compressibility, respiratory phasicity and response to augmentation. Rouleaux flow is noted within the left popliteal vein. Calf Veins: No evidence of thrombus. Normal compressibility and flow on color Doppler imaging. Superficial Great Saphenous Vein: No evidence of thrombus. Normal compressibility. Other Findings:  None. LEFT LOWER EXTREMITY Common Femoral Vein: No evidence of thrombus. Normal compressibility, respiratory phasicity and response to augmentation. Saphenofemoral Junction: No evidence of thrombus. Normal compressibility and flow on color Doppler imaging. Profunda Femoral Vein: No evidence of thrombus. Normal compressibility and flow on color Doppler imaging. Femoral Vein: No evidence of thrombus. Normal compressibility, respiratory phasicity and response to augmentation. Popliteal Vein: No evidence of thrombus. Normal compressibility, respiratory phasicity and response to augmentation. Calf Veins: No evidence of thrombus. Normal compressibility and flow on color Doppler imaging. Superficial Great Saphenous Vein: No evidence of thrombus. Normal compressibility. Other Findings:  None. IMPRESSION: No evidence of DVT within either lower extremity. Electronically Signed   By: Simonne Come M.D.   On: 05/05/2023 16:40   DG Abd 2 Views  Result Date: 05/04/2023 CLINICAL DATA:  Abdominal distension EXAM: ABDOMEN - 2 VIEW COMPARISON:  05/01/2023, 04/29/2023 FINDINGS: Progressive gaseous distension of both large and small bowel loops throughout the abdomen. No gross free intraperitoneal air. IMPRESSION: Progressive gaseous distension of both large and small bowel loops throughout the abdomen. Pattern favors ileus.  Obstruction not excluded. Electronically Signed   By: Duanne Guess  D.O.   On: 05/04/2023 18:54   [place] ASCITES (ABDOMEN LIMITED)  Result Date: 05/04/2023 CLINICAL DATA:  Abdominal ascites. EXAM: LIMITED ABDOMEN ULTRASOUND FOR ASCITES TECHNIQUE: Limited ultrasound survey for ascites was performed in all four abdominal quadrants. COMPARISON:  CT of the abdomen 05/01/2023 FINDINGS: Small volume ascites is present in the right lower quadrant and at the midline. No significant fluid pockets are present to allow for paracentesis. IMPRESSION: Stable appearance of small volume ascites. Electronically Signed   By: Marin Roberts M.D.   On: 05/04/2023 12:41   CT ABDOMEN WO CONTRAST  Result Date: 05/01/2023 CLINICAL DATA:  Inpatient. Extensive left flank bruising. Generalized abdominal pain. No known injury. EXAM: CT ABDOMEN WITHOUT CONTRAST TECHNIQUE: Multidetector CT imaging of the abdomen was performed following the standard protocol without IV contrast. RADIATION DOSE REDUCTION: This exam was performed according to the departmental dose-optimization program which includes automated exposure control, adjustment of the mA and/or kV according to patient size and/or use of iterative reconstruction technique. COMPARISON:  04/28/2023 CT abdomen/pelvis. FINDINGS: Lower chest: No significant pulmonary nodules or acute consolidative airspace disease. Coronary atherosclerosis. Hepatobiliary: Severe diffuse hepatic steatosis. Relative hypertrophy of caudate lobe with lobulated liver contour, cannot exclude cirrhosis. No discrete liver mass on this noncontrast exam. Somewhat contracted gallbladder with minimal layering hyperdense material, favoring vicarious excretion of contrast from recent IV contrast CT study, without definite gallbladder wall thickening. No biliary ductal dilatation. Pancreas: Normal, with no mass or duct dilation. Spleen: Normal size. No mass. Adrenals/Urinary Tract: Normal adrenals. No renal  stones. No hydronephrosis. Simple 3.4 cm anterior interpolar left renal cyst, for which no follow-up imaging is recommended. Stomach/Bowel: Normal non-distended stomach. Visualized small and large bowel is normal caliber, with no bowel wall thickening. Vascular/Lymphatic: Atherosclerotic nonaneurysmal abdominal aorta. Mild paraumbilical and ventral right abdominal varices. No pathologically enlarged lymph nodes in the abdomen. Other: Small volume perihepatic ascites. No focal fluid collection. No pneumoperitoneum. Mild-to-moderate anasarca is worsened. No evidence of retroperitoneal hematoma. Musculoskeletal: No aggressive appearing focal osseous lesions. Mild thoracolumbar spondylosis. IMPRESSION: 1. No evidence of retroperitoneal hematoma. 2. Severe diffuse hepatic steatosis. Relative hypertrophy of the caudate lobe with lobulated liver contour, cannot exclude cirrhosis. No discrete liver mass on this noncontrast exam. 3. Small volume perihepatic ascites. Mild paraumbilical and ventral right abdominal varices. Mild-to-moderate anasarca is worsened. 4. Coronary atherosclerosis. 5.  Aortic Atherosclerosis (ICD10-I70.0). Electronically Signed   By: Delbert Phenix M.D.   On: 05/01/2023 19:23   US ABDOMEN LIMITED WITH LIVER DOPPLER  Result Date: 04/30/2023 CLINICAL DATA:  52 year old male with history of cirrhosis. EXAM: DUPLEX ULTRASOUND OF LIVER TECHNIQUE: Color and duplex Doppler ultrasound was performed to evaluate the hepatic in-flow and out-flow vessels. COMPARISON:  CT abdomen pelvis from 04/28/2023, 02/01/2013 FINDINGS: Liver: Diffusely increased echogenicity. Glisson's capsule is invisible amongst the portal veins. Mildly nodular contour. No focal lesion, mass or intrahepatic biliary ductal dilatation. Main Portal Vein size: 0.7 cm Portal Vein Velocities Main Prox:  51 cm/sec, hepatofugal Main Mid: 36 cm/sec, hepatofugal Main Dist:  38 cm/sec, hepatofugal Right: Not visualized. Left: 24 cm/sec, hepatofugal  Hepatic Vein Velocities Right:  53 cm/sec, antegrade Middle:  36 cm/sec, antegrade Left:  32 cm/sec, antegrade IVC: Not visualized. Hepatic Artery Velocity:  173 cm/sec Splenic Vein Velocity:  28 cm/sec Spleen: 12.7 cm x 12.8 cm x 6.2 cm with a total volume of 529 cm^3 (411 cm^3 is upper limit normal) Portal Vein Occlusion/Thrombus: No Splenic Vein Occlusion/Thrombus: No Ascites: Trace Varices: None IMPRESSION: 1. Diffuse severe  echogenicity throughout the hepatic parenchyma as could be seen with severe hepatic steatosis or in the acute setting of hepatitis. 2. Morphologic changes of cirrhosis with portal hypertension is evidence by mild splenomegaly, trace ascites, and hepatofugal flow the visualized portal system which is widely patent. Marliss Coots, MD [facility] Electronically Signed   By: Marliss Coots M.D.   On: 04/30/2023 15:35   DG Abd 1 View  Result Date: 04/29/2023 CLINICAL DATA:  Abdominal distension. EXAM: ABDOMEN - 1 VIEW COMPARISON:  CT abdomen/pelvis 04/27/2021 FINDINGS: Mildly prominent gaseous distension of colon with nonobstructive bowel gas pattern. No abnormal calcifications. Degenerative changes of the spine. IMPRESSION: Nonobstructive bowel gas pattern. Mildly prominent gaseous distension of colon. Electronically Signed   By: Feliberto Harts M.D.   On: 04/29/2023 16:58   [place] ASCITES (ABDOMEN LIMITED)  Result Date: 04/29/2023 CLINICAL DATA:  Ascites Interventional radiology consulted for paracentesis EXAM: LIMITED ABDOMEN ULTRASOUND FOR ASCITES TECHNIQUE: Limited ultrasound survey for ascites was performed in all four abdominal quadrants. COMPARISON:  CT abdomen pelvis 04/28/2023 FINDINGS: Sonographic evaluation of the abdomen demonstrates trace perihepatic ascites which is insufficient for aspiration. IMPRESSION: Trace perihepatic ascites, insufficient for aspiration. No paracentesis was performed. Electronically Signed   By:  Acquanetta Belling M.D.   On: 04/29/2023 09:56   CT ABDOMEN PELVIS W CONTRAST  Result Date: 04/28/2023 CLINICAL DATA:  Bowel obstruction suspected. Acute, nonlocalized abdominal pain. EXAM: CT ABDOMEN AND PELVIS WITH CONTRAST TECHNIQUE: Multidetector CT imaging of the abdomen and pelvis was performed using the standard protocol following bolus administration of intravenous contrast. RADIATION DOSE REDUCTION: This exam was performed according to the departmental dose-optimization program which includes automated exposure control, adjustment of the mA and/or kV according to patient size and/or use of iterative reconstruction technique. CONTRAST:  OMNIPAQUE IOHEXOL 300 MG/ML  SOLN COMPARISON:  09/24/2021 FINDINGS: Lower chest:  Coronary atherosclerosis. Hepatobiliary: Severe hepatic steatosis. Lobulated liver surface with newly enlarged caudate and further widened fissures.No evidence of biliary obstruction or stone. Pancreas: Unremarkable. Spleen: Unremarkable. Adrenals/Urinary Tract: Negative adrenals. No hydronephrosis or stone. Left renal hilar and cortical cysts with simple appearance measuring up to 3.2 cm at the renal hilum. Unremarkable bladder. Stomach/Bowel: No obstruction. No appendicitis other visible bowel inflammation. Vascular/Lymphatic: No acute vascular abnormality. Scattered atheromatous calcification. No mass or adenopathy. Reproductive:No pathologic findings. Other: New small volume ascites distributed throughout the abdomen including at the small umbilical hernia. Musculoskeletal: No acute abnormalities. Generalized degeneration with mild scoliosis. IMPRESSION: 1. Severe hepatic steatosis with progressive findings of cirrhosis since 2023. Small volume ascites is newly seen. 2. Extensive atherosclerosis. Electronically Signed   By: Tiburcio Pea M.D.   On: 04/28/2023 05:30    Assessment  52 y.o. male with a history of Wolff-Parkinson-White syndrome s/p ablation, CAD, chronic pain, diastolic  heart failure, GERD, HTN, adenomatous colon polyps, gastric ulcers, ulcerative esophagitis, alcohol abuse who presented to the hospital with findings of decompensated cirrhosis and abdominal pain.  Abdominal pain and distention: Continues to have ongoing abdominal distention however is passing small amounts of stool.  Reports of large liquid stool today 8/21.  Had KUB on Monday with progressive gas distention of both the large and small bowel loops throughout the abdomen.  Repeat CT scan on 8/20 showed diffusely distended colon with air-fluid levels without any transition point compatible with ileus as well as trace ascites and body wall edema.  He continues to have abdominal bruising to bilateral flanks which patient feels as though it  is secondary to work transfer to CT scan on 8/13.  Continues to have pain in this area.  There have been no evidence of hematoma on recent CT scans.  Agree with hospitalist on completing KUB today.  Will continue all current bowel regimen including lactulose 30 g twice daily, Linzess 290 mcg daily.  Senokot likely contributing to abdominal pain given cramping.  Will continue lactulose and Linzess.  Will discontinue Senokot and plac on MiraLAX 3 times daily (discussed with Dr. Tasia Catchings).  Plan to repeat KUB tomorrow  Anemia: Hemoglobin stable at 10-11.  No signs of any overt GI bleeding.  Currently overdue for EGD and colonoscopy which will need to be completed outpatient unless he were to develop significant decline in hemoglobin or any reports of GI bleeding.  He continues on PPI  BID. He had an EGD earlier this year with hematemesis that revealed grade D reflux esophagitis with ulceration and recently bleeding gastritis without evidence of varices.  He was originally recommended an 8-week follow-up EGD however he was lost outpatient follow-up.  Last colonoscopy in 2014 with hemorrhoids and tubular adenomas and was due for repeat in 2019.  Decompensated cirrhosis: MELD 3.0  remains at 21.  Given his abdominal distention there has been ongoing concern for ascites however he has had multiple ultrasounds which revealed minimal ascites and on for paracentesis.  He has had imaging without evidence of portal vein thrombosis.  He has minimal lower extremity edema although he is having some mild pitting edema in his bilateral flank and hips.  He has received albumin this admission as well.  He is currently on Lasix 40 mg twice daily and spironolactone 50 mg once daily.  Creatinine remains stable.  He has had a decrease in potassium today and he has ongoing hyponatremia however stable from yesterday with sodium 132.  Alcoholic hepatitis: DF 38.1 with PT control 13 on 8/21 and was started on prednisolone.  Will need to check Lille score on day 7 (8/27).  Will need total of 28 days of therapy with taper.  Plan / Recommendations  CMP, Mg, Ph, INR tomorrow Agree with KUB today. Recommend replacing potassium. Limit opioids Continue to encourage patient to participate in physical therapy Continue Lasix 40 mg twice daily and Aldactone 50 mg daily titrate as needed. Discontinue Senokot Continue Linzess and lactulose at current dose Start MiraLAX 17 g 3 times daily Continue prednisolone. Will need Lille score at day 7    LOS: 10 days    05/08/2023, 10:29 AM   Brooke Bonito, MSN, FNP-BC, AGACNP-BC [facility]

## 2023-05-08 NOTE — Progress Notes (Signed)
PROGRESS NOTE  Timothy [name] EXB:[id number] DOB: 1970/12/29 DOA: 04/28/2023 PCP: Elfredia Nevins, MD  Brief History:  Timothy [name] is a 52 year old male with history of HTN, Barrett esophagus, PUD, WPW status post ablation, NICM, alcohol abuse, and liver disease who presents with worsening abdominal distention and pain. 08/13: to ED. Mult electrolyte derangement, low Plt. CT of the abdomen pelvis with IV contrast showed presence of new cirrhotic changes with small volume of ascites. Admitted w/ decompensated cirrhosis/ascites. GI consult. Plan paracentesis. Plan start furosemide/spironolactone when electrolytes are improved 08/14: Ultrasound today reveals trace perihepatic ascites insufficient for tap. Subsequent plain films revealed nonspecific bowel gas pattern with some increase in colonic air. No evidence of obstruction, impaction or free air. Plan liver doppler.  08/15: Liver doppler done. Add spironolactone 100mg  daily/continue lasix 40mg  daily. More aggressive bowel regimen given no signs of obstruction and narcotic use. Will start Linzess daily. Can titrate accordingly.  08/16: Liver Doppler studies with severe steatosis, portal hypertension and patent portal vein. Start lactulose 10 g twice daily. Holding diuretics. Repeated CT to eval L flank bruise and drop Hgb, no hematoma 08/17: still off lasix/spiro, increase lactulose to 20 bid. Stable to transfer out of stepdown. Follow GI recs - consider repeat US / attempt paracentesis?  08/18: BP and K improved, restart lasix/spiro today. Of note, pt reported concern for when he was down for his MRI, he was moved roughly by staff and this maybe caused the bruising,   05/04/2023: Improving, low sodium, potassium..  Sodium potassium repleted 05/05/2023: Reporting of increased abdominal pain, distention, feeling weak, constipated, 05/06/2023: Complain of generalized weaknesses, generalized aches and pain, still having abdominal  distention 05/07/2023: Lethargic, complaining of abdominal pain, hemodynamically stable, much improved labs -pending insurance authorization and approval for discharge to SNF 05/08/23--pt sitting up in chair.  Had 3 loose BM last 24 hours.  Complains abd pain.  Abd distended.  KUB ordered       ASSESSMENT & PLAN:  Decompensated Alcoholic Liver Cirrhosis  / Generalized Abdominal pain Progressive alcoholic liver cirrhosis and severe steatosis resulting in portal HTN, ascites Hyponatremia d/t liver disease, EtOH Thrombocytopenia d/t liver disease   -Repeat ultrasound, and CT scan did not reveal enough fluid for paracentesis) -MELD score of 25, Maddrey DF up to 38-prednisolone started on 05/06/23 -Liver Doppler without evidence of PVT.   No signs of SBP.  GI following -appreciated discussed with Dr. Tasia Catchings Lasix  40 mg po bid Cleda Daub at 50 mg po daily  Lactulose 30g bid  2g sodium diet  Absolute EtOH cessation  Needs outpatient colonoscopy for surveillance purposes. Can consider EGD at the same time to evaluate for varices as well as to screen for Barrett's esophagus.  Obtaining CT scan of abdomenDiffuse colonic distention with air-fluid, possible ileus, liver cirrhosis and fatty infiltration, trace ascites, body wall edema, benign renal cyst 3.2 cm:   Ileus Complicated by opiate dependence,  Lactulose as above  BM x 3 in past 24 hours Linzess 290 mcg daily  Repeat KUB Optimize electrolytes Increase activity  Anemia stable no evidence of active GI bleeding.  Previous EGD with ulcerative esophagitis.  Continue to monitor hemoglobin no plans for inpatient endoscopic evaluation  Continue PPI twice daily.   Bilateral flank bruising No retroperitoneal hematoma on CT 8/20 Monitor on physical exam Monitor Hgb   Hypomagnesemia /Hypokalemia/hyponatremia Replace as needed Much improved lecture lites Monitor BMP, Mg discontinue sodium supplements--contributing to his  fluid  retention   Barrett's esophagus without dysplasia GERD Continue PPI twice daily.   Alcohol Abuser/History of DTs Monitoring closely, following CIWA protocol continue daily thiamine, folate replacement  Absolute EtOH cessation    Opioid dependence - Chronic Pain  Opioid induced chronic constipation  Pt reports being on opioids since 1990s and has withdrawal symptoms if not receiving resume his home oxycodone 15 mg Q6h prn, may consider increase dose as wean off IV meds, plan taper this as well outpatient  PDMP reviewed  Oxycodone 15 mg, #120, last refill 03/31/23   Chronic HFmrEF Last echo 12/19/2020:  LVEF 45-50%, mildly decreased LV function moderate LVH, grade 1 diastolic dysfunction impaired relaxation Monitor I's and O's Limit IV fluid resuscitation Repeat echo  History of Wolff-Parkinson-White syndrome Status post ablation, monitoring  H/o hypertension -hypotensive Was persistently hypotensive, medications were held, BP is improving  Dyslipidemia Holding statins due to elevated LFTs due to alcohol use abuse liver cirrhosis     Consultants:  Gastroenterology   Procedures: None    DVT prophylaxis: SCD d/t low Plt Pertinent IV fluids/nutrition: no continuous IV fluids  Central lines / invasive devices: none  Code Status: FULL CODE ACP documentation reviewed: 05/04/23 none on file   Current Admission Status: inpatient TOC needs / Dispo plan: SNF rehab when medically stable   Barriers to discharge / significant pending items:  Not medically stable       Subjective:  He's complaining of abd pain, about same.  Had n/v x 1 yesterday.  Denies f/c, cp, sob. Objective: Vitals:   05/07/23 1438 05/07/23 2105 05/08/23 0409 05/08/23 0500  BP: (!) 136/98 117/81 129/89   Pulse: 77 77 74   Resp: 18 16 18    Temp: 98.5 F (36.9 C) 97.8 F (36.6 C) (!) 97.2 F (36.2 C)   TempSrc: Oral Oral    SpO2: 96% 94% 96%   Weight:    107.5 kg  Height:         Intake/Output Summary (Last 24 hours) at 05/08/2023 1132 Last data filed at 05/08/2023 0941 Gross per 24 hour  Intake 480 ml  Output 1500 ml  Net -1020 ml   Weight change: -3.7 kg Exam:  General:  Pt is alert, follows commands appropriately, not in acute distress HEENT: No icterus, No thrush, No neck mass, [place]/AT Cardiovascular: RRR, S1/S2, no rubs, no gallops Respiratory: bibasilar crackles.  No wheeze Abdomen: Soft/+BS, non tender, non distended, no guarding Extremities: 1+LE edema, No lymphangitis, No petechiae, No rashes, no synovitis   Data Reviewed: I have personally reviewed following labs and imaging studies Basic Metabolic Panel: Recent Labs  Lab 05/04/23 0530 05/05/23 0439 05/06/23 0443 05/07/23 0427 05/08/23 0438  NA 129* 128* 133* 132* 132*  K 2.9* 3.0* 3.1* 3.6 2.9*  CL 94* 95* 102 99 101  CO2 24 22 21* 23 21*  GLUCOSE 78 94 89 111* 111*  BUN 9 10 8 8 11   CREATININE 1.07 1.15 0.85 0.99 1.04  CALCIUM 8.6* 8.3* 9.2 9.5 9.4  MG  --   --  1.8  --   --    Liver Function Tests: Recent Labs  Lab 05/04/23 0530 05/05/23 0439 05/06/23 0443 05/07/23 0427 05/08/23 0438  AST 137* 114* 101* 97* 107*  ALT 40 37 32 36 41  ALKPHOS 173* 142* 125 112 106  BILITOT 7.5* 5.9* 7.3* 7.5* 5.5*  PROT 7.1 6.3* 6.7 7.7 7.5  ALBUMIN 2.4* 2.2* 3.2* 4.2 4.0   Recent Labs  Lab 05/05/23 1713  LIPASE 76*   No results for input(s): "AMMONIA" in the last 168 hours. Coagulation Profile: Recent Labs  Lab 05/04/23 0530 05/05/23 0439 05/06/23 0443 05/07/23 0427 05/08/23 0438  INR 1.4* 1.5* 1.6* 1.6* 1.7*   CBC: Recent Labs  Lab 05/01/23 1439 05/02/23 0346 05/03/23 0433 05/04/23 0530  WBC 8.2 6.3 6.9 7.0  HGB 10.1* 9.9* 10.4* 11.1*  HCT 30.1* 29.6* 30.9* 33.3*  MCV 97.7 97.4 98.1 97.9  PLT 124* 119* 122* 142*   Cardiac Enzymes: No results for input(s): "CKTOTAL", "CKMB", "CKMBINDEX", "TROPONINI" in the last 168 hours. BNP: Invalid input(s):  "POCBNP" CBG: Recent Labs  Lab 05/04/23 0755 05/05/23 0725 05/06/23 0751 05/07/23 0801 05/08/23 0759  GLUCAP 90 82 70 109* 82   HbA1C: No results for input(s): "HGBA1C" in the last 72 hours. Urine analysis:    Component Value Date/Time   COLORURINE YELLOW 04/11/2016 1044   APPEARANCEUR CLEAR 04/11/2016 1044   LABSPEC 1.011 04/11/2016 1044   PHURINE 7.5 04/11/2016 1044   GLUCOSEU NEGATIVE 04/11/2016 1044   HGBUR NEGATIVE 04/11/2016 1044   BILIRUBINUR NEGATIVE 04/11/2016 1044   KETONESUR NEGATIVE 04/11/2016 1044   PROTEINUR NEGATIVE 04/11/2016 1044   UROBILINOGEN 0.2 10/29/2014 0937   NITRITE NEGATIVE 04/11/2016 1044   LEUKOCYTESUR NEGATIVE 04/11/2016 1044   Sepsis Labs: @LABRCNTIP (procalcitonin:4,lacticidven:4) ) Recent Results (from the past 240 hour(s))  MRSA Next Gen by PCR, Nasal     Status: None   Collection Time: 04/29/23 12:25 AM   Specimen: Nasal Mucosa; Nasal Swab  Result Value Ref Range Status   MRSA by PCR Next Gen NOT DETECTED NOT DETECTED Final    Comment: (NOTE) The GeneXpert MRSA Assay (FDA approved for NASAL specimens only), is one component of a comprehensive MRSA colonization surveillance program. It is not intended to diagnose MRSA infection nor to guide or monitor treatment for MRSA infections. Test performance is not FDA approved in patients less than 18 years old. Performed at [facility], [address], [place], [place] [address]      Scheduled Meds:  carvedilol  6.25 mg Oral BID WC   folic acid  1 mg Oral Daily   furosemide  40 mg Oral BID   gabapentin  200 mg Oral TID   lactulose  30 g Oral BID   linaclotide  290 mcg Oral QAC breakfast   multivitamin with minerals  1 tablet Oral Daily   nicotine  21 mg Transdermal Daily   pantoprazole  40 mg Oral Daily   potassium chloride  20 mEq Oral Once   prednisoLONE  40 mg Oral Daily   senna-docusate  1 tablet Oral QHS   sodium chloride flush  3 mL Intravenous Q12H   sodium chloride  1 g  Oral BID WC   spironolactone  50 mg Oral Daily   sucralfate  1 g Oral BID   thiamine  100 mg Oral Daily   Or   thiamine  100 mg Intravenous Daily   traZODone  100 mg Oral QHS   Continuous Infusions:  sodium chloride 10 mL/hr at 05/05/23 1652   magnesium sulfate bolus IVPB     potassium chloride      Procedures/Studies: CT ABDOMEN PELVIS W CONTRAST  Result Date: 05/05/2023 CLINICAL DATA:  Abdominal pain and distention EXAM: CT ABDOMEN AND PELVIS WITH CONTRAST TECHNIQUE: Multidetector CT imaging of the abdomen and pelvis was performed using the standard protocol following bolus administration of intravenous contrast. RADIATION DOSE REDUCTION: This exam was performed  according to the departmental dose-optimization program which includes automated exposure control, adjustment of the mA and/or kV according to patient size and/or use of iterative reconstruction technique. CONTRAST:  OMNIPAQUE IOHEXOL 300 MG/ML  SOLN COMPARISON:  Abdominal CT 05/01/2023. CT abdomen and pelvis 04/28/2023. FINDINGS: Lower chest: No acute abnormality. Hepatobiliary: Diffusely heterogeneous and hypoattenuating as seen on the prior study compatible with fatty infiltration and likely cirrhosis. Gallbladder and bile ducts are within normal limits. Pancreas: Unremarkable. No pancreatic ductal dilatation or surrounding inflammatory changes. Spleen: Normal in size without focal abnormality. Adrenals/Urinary Tract: Left renal cysts are present measuring up to 3.2 cm. There is no hydronephrosis. The adrenal glands bladder are within normal limits. Stomach/Bowel: Colon is diffusely distended with air-fluid levels. No transition point. No focal inflammation. The appendix is not seen. Small bowel and stomach are within normal limits. Vascular/Lymphatic: Aortic atherosclerosis. No enlarged abdominal or pelvic lymph nodes. Reproductive: Prostate is unremarkable. Other: There is trace ascites. There is diffuse abdominal wall edema.  Musculoskeletal: No acute or significant osseous findings. IMPRESSION: 1. Colon is diffusely distended with air-fluid levels. No transition point. Findings are compatible with ileus. 2. Cirrhotic and fatty infiltration of the liver. 3. Trace ascites. 4. Body wall edema. 5. Left Bosniak I benign renal cyst measuring 3.2 cm. No follow-up imaging is recommended. Timothy [name] Feb; [id number], Management of the Incidental Renal Mass on CT, [employer] 2021; [phone], Bosniak Classification of Cystic Renal Masses, Version 2019. Aortic Atherosclerosis (ICD10-I70.0). Electronically Signed   By: Darliss Cheney M.D.   On: 05/05/2023 18:57   US Venous Img Lower Bilateral (DVT)  Result Date: 05/05/2023 CLINICAL DATA:  Bilateral lower extremity pain and edema for the past 2 weeks. History of smoking. Evaluate for DVT. EXAM: BILATERAL LOWER EXTREMITY VENOUS DOPPLER ULTRASOUND TECHNIQUE: Gray-scale sonography with graded compression, as well as color Doppler and duplex ultrasound were performed to evaluate the lower extremity deep venous systems from the level of the common femoral vein and including the common femoral, femoral, profunda femoral, popliteal and calf veins including the posterior tibial, peroneal and gastrocnemius veins when visible. The superficial great saphenous vein was also interrogated. Spectral Doppler was utilized to evaluate flow at rest and with distal augmentation maneuvers in the common femoral, femoral and popliteal veins. COMPARISON:  None Available. FINDINGS: RIGHT LOWER EXTREMITY Common Femoral Vein: No evidence of thrombus. Normal compressibility, respiratory phasicity and response to augmentation. Saphenofemoral Junction: No evidence of thrombus. Normal compressibility and flow on color Doppler imaging. Profunda Femoral Vein: No evidence of thrombus. Normal compressibility and flow on color Doppler imaging. Femoral Vein: No evidence of thrombus. Normal compressibility, respiratory phasicity and  response to augmentation. Popliteal Vein: No evidence of thrombus. Normal compressibility, respiratory phasicity and response to augmentation. Rouleaux flow is noted within the left popliteal vein. Calf Veins: No evidence of thrombus. Normal compressibility and flow on color Doppler imaging. Superficial Great Saphenous Vein: No evidence of thrombus. Normal compressibility. Other Findings:  None. LEFT LOWER EXTREMITY Common Femoral Vein: No evidence of thrombus. Normal compressibility, respiratory phasicity and response to augmentation. Saphenofemoral Junction: No evidence of thrombus. Normal compressibility and flow on color Doppler imaging. Profunda Femoral Vein: No evidence of thrombus. Normal compressibility and flow on color Doppler imaging. Femoral Vein: No evidence of thrombus. Normal compressibility, respiratory phasicity and response to augmentation. Popliteal Vein: No evidence of thrombus. Normal compressibility, respiratory phasicity and response to augmentation. Calf Veins: No evidence of thrombus. Normal compressibility and flow on color Doppler imaging. Superficial Great Saphenous Vein:  No evidence of thrombus. Normal compressibility. Other Findings:  None. IMPRESSION: No evidence of DVT within either lower extremity. Electronically Signed   By: Simonne Come M.D.   On: 05/05/2023 16:40   DG Abd 2 Views  Result Date: 05/04/2023 CLINICAL DATA:  Abdominal distension EXAM: ABDOMEN - 2 VIEW COMPARISON:  05/01/2023, 04/29/2023 FINDINGS: Progressive gaseous distension of both large and small bowel loops throughout the abdomen. No gross free intraperitoneal air. IMPRESSION: Progressive gaseous distension of both large and small bowel loops throughout the abdomen. Pattern favors ileus. Obstruction not excluded. Electronically Signed   By: Duanne Guess D.O.   On: 05/04/2023 18:54   [place] ASCITES (ABDOMEN LIMITED)  Result Date: 05/04/2023 CLINICAL DATA:  Abdominal ascites. EXAM: LIMITED ABDOMEN ULTRASOUND  FOR ASCITES TECHNIQUE: Limited ultrasound survey for ascites was performed in all four abdominal quadrants. COMPARISON:  CT of the abdomen 05/01/2023 FINDINGS: Small volume ascites is present in the right lower quadrant and at the midline. No significant fluid pockets are present to allow for paracentesis. IMPRESSION: Stable appearance of small volume ascites. Electronically Signed   By: Marin Roberts M.D.   On: 05/04/2023 12:41   CT ABDOMEN WO CONTRAST  Result Date: 05/01/2023 CLINICAL DATA:  Inpatient. Extensive left flank bruising. Generalized abdominal pain. No known injury. EXAM: CT ABDOMEN WITHOUT CONTRAST TECHNIQUE: Multidetector CT imaging of the abdomen was performed following the standard protocol without IV contrast. RADIATION DOSE REDUCTION: This exam was performed according to the departmental dose-optimization program which includes automated exposure control, adjustment of the mA and/or kV according to patient size and/or use of iterative reconstruction technique. COMPARISON:  04/28/2023 CT abdomen/pelvis. FINDINGS: Lower chest: No significant pulmonary nodules or acute consolidative airspace disease. Coronary atherosclerosis. Hepatobiliary: Severe diffuse hepatic steatosis. Relative hypertrophy of caudate lobe with lobulated liver contour, cannot exclude cirrhosis. No discrete liver mass on this noncontrast exam. Somewhat contracted gallbladder with minimal layering hyperdense material, favoring vicarious excretion of contrast from recent IV contrast CT study, without definite gallbladder wall thickening. No biliary ductal dilatation. Pancreas: Normal, with no mass or duct dilation. Spleen: Normal size. No mass. Adrenals/Urinary Tract: Normal adrenals. No renal stones. No hydronephrosis. Simple 3.4 cm anterior interpolar left renal cyst, for which no follow-up imaging is recommended. Stomach/Bowel: Normal non-distended stomach. Visualized small and large bowel is normal caliber, with no  bowel wall thickening. Vascular/Lymphatic: Atherosclerotic nonaneurysmal abdominal aorta. Mild paraumbilical and ventral right abdominal varices. No pathologically enlarged lymph nodes in the abdomen. Other: Small volume perihepatic ascites. No focal fluid collection. No pneumoperitoneum. Mild-to-moderate anasarca is worsened. No evidence of retroperitoneal hematoma. Musculoskeletal: No aggressive appearing focal osseous lesions. Mild thoracolumbar spondylosis. IMPRESSION: 1. No evidence of retroperitoneal hematoma. 2. Severe diffuse hepatic steatosis. Relative hypertrophy of the caudate lobe with lobulated liver contour, cannot exclude cirrhosis. No discrete liver mass on this noncontrast exam. 3. Small volume perihepatic ascites. Mild paraumbilical and ventral right abdominal varices. Mild-to-moderate anasarca is worsened. 4. Coronary atherosclerosis. 5.  Aortic Atherosclerosis (ICD10-I70.0). Electronically Signed   By: Delbert Phenix M.D.   On: 05/01/2023 19:23   US ABDOMEN LIMITED WITH LIVER DOPPLER  Result Date: 04/30/2023 CLINICAL DATA:  52 year old male with history of cirrhosis. EXAM: DUPLEX ULTRASOUND OF LIVER TECHNIQUE: Color and duplex Doppler ultrasound was performed to evaluate the hepatic in-flow and out-flow vessels. COMPARISON:  CT abdomen pelvis from 04/28/2023, 02/01/2013 FINDINGS: Liver: Diffusely increased echogenicity. Glisson's capsule is invisible amongst the portal veins. Mildly nodular contour. No focal lesion, mass or intrahepatic biliary ductal dilatation.  Main Portal Vein size: 0.7 cm Portal Vein Velocities Main Prox:  51 cm/sec, hepatofugal Main Mid: 36 cm/sec, hepatofugal Main Dist:  38 cm/sec, hepatofugal Right: Not visualized. Left: 24 cm/sec, hepatofugal Hepatic Vein Velocities Right:  53 cm/sec, antegrade Middle:  36 cm/sec, antegrade Left:  32 cm/sec, antegrade IVC: Not visualized. Hepatic Artery Velocity:  173 cm/sec Splenic Vein Velocity:  28 cm/sec Spleen: 12.7 cm x 12.8 cm x  6.2 cm with a total volume of 529 cm^3 (411 cm^3 is upper limit normal) Portal Vein Occlusion/Thrombus: No Splenic Vein Occlusion/Thrombus: No Ascites: Trace Varices: None IMPRESSION: 1. Diffuse severe echogenicity throughout the hepatic parenchyma as could be seen with severe hepatic steatosis or in the acute setting of hepatitis. 2. Morphologic changes of cirrhosis with portal hypertension is evidence by mild splenomegaly, trace ascites, and hepatofugal flow the visualized portal system which is widely patent. Marliss Coots, MD [facility] Electronically Signed   By: Marliss Coots M.D.   On: 04/30/2023 15:35   DG Abd 1 View  Result Date: 04/29/2023 CLINICAL DATA:  Abdominal distension. EXAM: ABDOMEN - 1 VIEW COMPARISON:  CT abdomen/pelvis 04/27/2021 FINDINGS: Mildly prominent gaseous distension of colon with nonobstructive bowel gas pattern. No abnormal calcifications. Degenerative changes of the spine. IMPRESSION: Nonobstructive bowel gas pattern. Mildly prominent gaseous distension of colon. Electronically Signed   By: Feliberto Harts M.D.   On: 04/29/2023 16:58   [place] ASCITES (ABDOMEN LIMITED)  Result Date: 04/29/2023 CLINICAL DATA:  Ascites Interventional radiology consulted for paracentesis EXAM: LIMITED ABDOMEN ULTRASOUND FOR ASCITES TECHNIQUE: Limited ultrasound survey for ascites was performed in all four abdominal quadrants. COMPARISON:  CT abdomen pelvis 04/28/2023 FINDINGS: Sonographic evaluation of the abdomen demonstrates trace perihepatic ascites which is insufficient for aspiration. IMPRESSION: Trace perihepatic ascites, insufficient for aspiration. No paracentesis was performed. Electronically Signed   By: Acquanetta Belling M.D.   On: 04/29/2023 09:56   CT ABDOMEN PELVIS W CONTRAST  Result Date: 04/28/2023 CLINICAL DATA:  Bowel obstruction suspected. Acute, nonlocalized abdominal pain. EXAM: CT ABDOMEN AND PELVIS WITH CONTRAST  TECHNIQUE: Multidetector CT imaging of the abdomen and pelvis was performed using the standard protocol following bolus administration of intravenous contrast. RADIATION DOSE REDUCTION: This exam was performed according to the departmental dose-optimization program which includes automated exposure control, adjustment of the mA and/or kV according to patient size and/or use of iterative reconstruction technique. CONTRAST:  OMNIPAQUE IOHEXOL 300 MG/ML  SOLN COMPARISON:  09/24/2021 FINDINGS: Lower chest:  Coronary atherosclerosis. Hepatobiliary: Severe hepatic steatosis. Lobulated liver surface with newly enlarged caudate and further widened fissures.No evidence of biliary obstruction or stone. Pancreas: Unremarkable. Spleen: Unremarkable. Adrenals/Urinary Tract: Negative adrenals. No hydronephrosis or stone. Left renal hilar and cortical cysts with simple appearance measuring up to 3.2 cm at the renal hilum. Unremarkable bladder. Stomach/Bowel: No obstruction. No appendicitis other visible bowel inflammation. Vascular/Lymphatic: No acute vascular abnormality. Scattered atheromatous calcification. No mass or adenopathy. Reproductive:No pathologic findings. Other: New small volume ascites distributed throughout the abdomen including at the small umbilical hernia. Musculoskeletal: No acute abnormalities. Generalized degeneration with mild scoliosis. IMPRESSION: 1. Severe hepatic steatosis with progressive findings of cirrhosis since 2023. Small volume ascites is newly seen. 2. Extensive atherosclerosis. Electronically Signed   By: Tiburcio Pea M.D.   On: 04/28/2023 05:30    Catarina Hartshorn, DO  [facility]  If 7PM-7AM, please contact night-coverage [email or web address] Password [facility] 05/08/2023, 11:32 AM   LOS: 10 days

## 2023-05-08 NOTE — TOC Progression Note (Signed)
Transition of Care [facility]) - Progression Note    Patient Details  Name: Timothy [name] MRN: [id number] Date of Birth: [birth year]  Transition of [facility]) CM/SW Contact  Elliot Gault, LCSW Phone Number: 05/08/2023, 12:10 PM  Clinical Narrative:     TOC following. Per Dr. Arbutus Leas, pt is not medically stable for dc and will likely require several more days of hospital level care. TOC cancelled SNF auth request. Will follow up Monday for further assistance with dc planning.  Expected Discharge Plan: Skilled Nursing Facility Barriers to Discharge: Continued Medical Work up  Expected Discharge Plan and Services In-house Referral: Clinical Social Work     Living arrangements for the past 2 months: Single Family Home                                       Social Determinants of Health (SDOH) Interventions SDOH Screenings   Food Insecurity: No Food Insecurity (05/03/2023)  Housing: Low Risk  (05/03/2023)  Transportation Needs: No Transportation Needs (05/03/2023)  Utilities: Not At Risk (05/03/2023)  Tobacco Use: High Risk (05/03/2023)    Readmission Risk Interventions     No data to display

## 2023-05-09 DIAGNOSIS — K567 Ileus, unspecified: Secondary | ICD-10-CM

## 2023-05-09 DIAGNOSIS — R1084 Generalized abdominal pain: Secondary | ICD-10-CM | POA: Diagnosis not present

## 2023-05-09 DIAGNOSIS — E876 Hypokalemia: Secondary | ICD-10-CM | POA: Diagnosis not present

## 2023-05-09 DIAGNOSIS — K7031 Alcoholic cirrhosis of liver with ascites: Secondary | ICD-10-CM | POA: Diagnosis not present

## 2023-05-09 LAB — COMPREHENSIVE METABOLIC PANEL
ALT: 46 U/L — ABNORMAL HIGH (ref 0–44)
AST: 104 U/L — ABNORMAL HIGH (ref 15–41)
Albumin: 3.7 g/dL (ref 3.5–5.0)
Alkaline Phosphatase: 104 U/L (ref 38–126)
Anion gap: 9 (ref 5–15)
BUN: 11 mg/dL (ref 6–20)
CO2: 24 mmol/L (ref 22–32)
Calcium: 9.3 mg/dL (ref 8.9–10.3)
Chloride: 102 mmol/L (ref 98–111)
Creatinine, Ser: 1.01 mg/dL (ref 0.61–1.24)
GFR, Estimated: >60 mL/min (ref >60)
Glucose, Bld: 105 mg/dL — ABNORMAL HIGH (ref 70–99)
Potassium: 3 mmol/L — ABNORMAL LOW (ref 3.5–5.1)
Sodium: 135 mmol/L (ref 135–145)
Total Bilirubin: 5.4 mg/dL — ABNORMAL HIGH (ref 0.3–1.2)
Total Protein: 7.5 g/dL (ref 6.5–8.1)

## 2023-05-09 LAB — PROTIME-INR
INR: 1.7 — ABNORMAL HIGH (ref 0.8–1.2)
Prothrombin Time: 20.1 s — ABNORMAL HIGH (ref 11.4–15.2)

## 2023-05-09 LAB — PHOSPHORUS: Phosphorus: 2 mg/dL — ABNORMAL LOW (ref 2.5–4.6)

## 2023-05-09 LAB — MAGNESIUM: Magnesium: 2.1 mg/dL (ref 1.7–2.4)

## 2023-05-09 LAB — CBC
HCT: 31.8 % — ABNORMAL LOW (ref 39.0–52.0)
Hemoglobin: 10.4 g/dL — ABNORMAL LOW (ref 13.0–17.0)
MCH: 33.3 pg (ref 26.0–34.0)
MCHC: 32.7 g/dL (ref 30.0–36.0)
MCV: 101.9 fL — ABNORMAL HIGH (ref 80.0–100.0)
Platelets: 228 10*3/uL (ref 150–400)
RBC: 3.12 MIL/uL — ABNORMAL LOW (ref 4.22–5.81)
RDW: 20.4 % — ABNORMAL HIGH (ref 11.5–15.5)
WBC: 6.2 10*3/uL (ref 4.0–10.5)
nRBC: 0 % (ref 0.0–0.2)

## 2023-05-09 LAB — GLUCOSE, CAPILLARY: Glucose-Capillary: 83 mg/dL (ref 70–99)

## 2023-05-09 MED ORDER — POTASSIUM CHLORIDE CRYS ER 20 MEQ PO TBCR
40.0000 meq | EXTENDED_RELEASE_TABLET | Freq: Once | ORAL | Status: AC
  Administered 2023-05-09: 40 meq via ORAL
  Filled 2023-05-09: qty 2

## 2023-05-09 MED ORDER — FOLIC ACID 5 MG/ML IJ SOLN
1.0000 mg | Freq: Every day | INTRAMUSCULAR | Status: DC
  Administered 2023-05-09 – 2023-05-12 (×4): 1 mg via INTRAVENOUS
  Filled 2023-05-09 (×6): qty 0.2

## 2023-05-09 MED ORDER — ZINC OXIDE 40 % EX OINT: TOPICAL_OINTMENT | Freq: Two times a day (BID) | CUTANEOUS | Status: DC | PRN

## 2023-05-09 MED ORDER — POTASSIUM CHLORIDE 10 MEQ/100ML IV SOLN
10.0000 meq | INTRAVENOUS | Status: AC
  Administered 2023-05-09 (×3): 10 meq via INTRAVENOUS
  Filled 2023-05-09 (×3): qty 100

## 2023-05-09 MED ORDER — K PHOS MONO-SOD PHOS DI & MONO 155-852-130 MG PO TABS
500.0000 mg | ORAL_TABLET | Freq: Two times a day (BID) | ORAL | Status: DC
  Administered 2023-05-09 – 2023-05-12 (×6): 500 mg via ORAL
  Filled 2023-05-09 (×6): qty 2

## 2023-05-09 MED ORDER — POTASSIUM CHLORIDE 10 MEQ/100ML IV SOLN
10.0000 meq | INTRAVENOUS | Status: AC
  Administered 2023-05-09 (×2): 10 meq via INTRAVENOUS
  Filled 2023-05-09 (×2): qty 100

## 2023-05-09 MED ORDER — SODIUM CHLORIDE 0.9 % IV SOLN: 1.0000 mg | Freq: Every day | INTRAVENOUS | Status: DC

## 2023-05-09 NOTE — Progress Notes (Signed)
PROGRESS NOTE  Timothy [name] VZD:[id number] DOB: [birth year] DOA: 04/28/2023 PCP: Elfredia Nevins, MD  Brief History:  Timothy [name] is a 52 year old male with history of HTN, Barrett esophagus, PUD, WPW status post ablation, NICM, alcohol abuse, and liver disease who presents with worsening abdominal distention and pain. 08/13: to ED. Mult electrolyte derangement, low Plt. CT of the abdomen pelvis with IV contrast showed presence of new cirrhotic changes with small volume of ascites. Admitted w/ decompensated cirrhosis/ascites. GI consult. Plan paracentesis. Plan start furosemide/spironolactone when electrolytes are improved 08/14: Ultrasound today reveals trace perihepatic ascites insufficient for tap. Subsequent plain films revealed nonspecific bowel gas pattern with some increase in colonic air. No evidence of obstruction, impaction or free air. Plan liver doppler.  08/15: Liver doppler done. Add spironolactone 100mg  daily/continue lasix 40mg  daily. More aggressive bowel regimen given no signs of obstruction and narcotic use. Will start Linzess daily. Can titrate accordingly.  08/16: Liver Doppler studies with severe steatosis, portal hypertension and patent portal vein. Start lactulose 10 g twice daily. Holding diuretics. Repeated CT to eval L flank bruise and drop Hgb, no hematoma 08/17: still off lasix/spiro, increase lactulose to 20 bid. Stable to transfer out of stepdown. Follow GI recs - consider repeat US / attempt paracentesis?  08/18: BP and K improved, restart lasix/spiro today. Of note, pt reported concern for when he was down for his MRI, he was moved roughly by staff and this maybe caused the bruising,   05/04/2023: Improving, low sodium, potassium..  Sodium potassium repleted 05/05/2023: Reporting of increased abdominal pain, distention, feeling weak, constipated, 05/06/2023: Complain of generalized weaknesses, generalized aches and pain, still having abdominal  distention 05/07/2023: Lethargic, complaining of abdominal pain, hemodynamically stable, much improved labs -pending insurance authorization and approval for discharge to SNF 05/08/23--pt sitting up in chair.  Had 3 loose BM last 24 hours.  Complains abd pain.  Abd distended.  KUB ordered 05/09/23--pt in better spirits.  Had 4 BMs.  KUB shows stable bowel dilatation.  No vomiting       ASSESSMENT & PLAN:  Decompensated Alcoholic Liver Cirrhosis  / Generalized Abdominal pain Progressive alcoholic liver cirrhosis and severe steatosis resulting in portal HTN, ascites Hyponatremia d/t liver disease, EtOH Thrombocytopenia d/t liver disease   -Repeat ultrasound, and CT scan did not reveal enough fluid for paracentesis) -MELD score of 25, Maddrey DF up to 38-prednisolone started on 05/06/23 -Liver Doppler without evidence of PVT.   No signs of SBP.  GI following -appreciated discussed with Dr. Tasia Catchings Lasix  40 mg po bid>>hold 05/09/23 until K improved Spiro at 50 mg po daily >>hold 05/09/23 until K improved Lactulose 30g bid  2g sodium diet  Absolute EtOH cessation  Needs outpatient colonoscopy for surveillance purposes. Can consider EGD at the same time to evaluate for varices as well as to screen for Barrett's esophagus.  05/05/23 CT scan of abdomenDiffuse colonic distention with air-fluid, possible ileus, liver cirrhosis and fatty infiltration, trace ascites, body wall edema, benign renal cyst 3.2 cm:   Ileus Complicated by opiate dependence,  Lactulose as above  BM x 4 in past 24 hours Linzess 290 mcg daily  Repeat KUB--stable bowel dilatation Optimize electrolytes Increase activity  Anemia stable no evidence of active GI bleeding.  Previous EGD with ulcerative esophagitis.  Continue to monitor hemoglobin no plans for inpatient endoscopic evaluation  Continue PPI twice daily.   Bilateral flank bruising No retroperitoneal hematoma on  CT 8/20 Monitor on physical exam Monitor  Hgb--overall stable   Hypomagnesemia /Hypokalemia/hyponatremia/hypophosphatemia Replace as needed Much improved lecture lites Monitor BMP, Mg, phos discontinue sodium supplements--contributing to his fluid retention   Barrett's esophagus without dysplasia GERD Continue PPI twice daily.   Alcohol Abuser/History of DTs Monitoring closely, following CIWA protocol continue daily thiamine, folate replacement  Absolute EtOH cessation    Opioid dependence - Chronic Pain  Opioid induced chronic constipation  Pt reports being on opioids since 1990s and has withdrawal symptoms if not receiving resume his home oxycodone 15 mg Q6h prn, may consider increase dose as wean off IV meds, plan taper this as well outpatient  PDMP reviewed  Oxycodone 15 mg, #120, last refill 03/31/23   Chronic HFmrEF Last echo 12/19/2020:  LVEF 45-50%, mildly decreased LV function moderate LVH, grade 1 diastolic dysfunction impaired relaxation Monitor I's and O's Limit IV fluid resuscitation Repeat echo  History of Wolff-Parkinson-White syndrome Status post ablation, monitoring  H/o hypertension -hypotensive Was persistently hypotensive, medications were held, BP is improving  Dyslipidemia Holding statins due to elevated LFTs due to alcohol use abuse liver cirrhosis     Consultants:  Gastroenterology   Procedures: None    DVT prophylaxis: SCD d/t low Plt Pertinent IV fluids/nutrition: no continuous IV fluids  Central lines / invasive devices: none  Code Status: FULL CODE ACP documentation reviewed: 05/04/23 none on file   Current Admission Status: inpatient TOC needs / Dispo plan: SNF rehab when medically stable   Barriers to discharge / significant pending items:  Not medically stable          Subjective: Had 4 BMs in last 24 hours.  Denies n/v.  Has dyspnea on exertion.  Denies f/,c cp, hematochezia, melena  Objective: Vitals:   05/08/23 2156 05/09/23 0400 05/09/23 0543 05/09/23  1500  BP: 125/87 (!) 136/99 (!) 131/91 (!) 117/96  Pulse: 76 72 76 73  Resp:  (!) 24 (!) 22 (!) 22  Temp:  97.9 F (36.6 C) 98 F (36.7 C) 97.8 F (36.6 C)  TempSrc:  Oral Oral   SpO2:  97% 100% 96%  Weight:   106.8 kg   Height:        Intake/Output Summary (Last 24 hours) at 05/09/2023 1605 Last data filed at 05/09/2023 0900 Gross per 24 hour  Intake 360 ml  Output 2403 ml  Net -2043 ml   Weight change: -0.7 kg Exam:  General:  Pt is alert, follows commands appropriately, not in acute distress HEENT: No icterus, No thrush, No neck mass, [place]/AT Cardiovascular: RRR, S1/S2, no rubs, no gallops Respiratory: bibasilar crackles.  No wheeze Abdomen: Soft/+BS, diffuse tender, mild distended, no guarding Extremities: No edema, No lymphangitis, No petechiae, No rashes, no synovitis   Data Reviewed: I have personally reviewed following labs and imaging studies Basic Metabolic Panel: Recent Labs  Lab 05/05/23 0439 05/06/23 0443 05/07/23 0427 05/08/23 0438 05/09/23 0501  NA 128* 133* 132* 132* 135  K 3.0* 3.1* 3.6 2.9* 3.0*  CL 95* 102 99 101 102  CO2 22 21* 23 21* 24  GLUCOSE 94 89 111* 111* 105*  BUN 10 8 8 11 11   CREATININE 1.15 0.85 0.99 1.04 1.01  CALCIUM 8.3* 9.2 9.5 9.4 9.3  MG  --  1.8  --   --  2.1  PHOS  --   --   --   --  2.0*   Liver Function Tests: Recent Labs  Lab 05/05/23 0439 05/06/23 0443 05/07/23  2536 05/08/23 0438 05/09/23 0501  AST 114* 101* 97* 107* 104*  ALT 37 32 36 41 46*  ALKPHOS 142* 125 112 106 104  BILITOT 5.9* 7.3* 7.5* 5.5* 5.4*  PROT 6.3* 6.7 7.7 7.5 7.5  ALBUMIN 2.2* 3.2* 4.2 4.0 3.7   Recent Labs  Lab 05/05/23 1713  LIPASE 76*   No results for input(s): "AMMONIA" in the last 168 hours. Coagulation Profile: Recent Labs  Lab 05/05/23 0439 05/06/23 0443 05/07/23 0427 05/08/23 0438 05/09/23 0501  INR 1.5* 1.6* 1.6* 1.7* 1.7*   CBC: Recent Labs  Lab 05/03/23 0433 05/04/23 0530 05/09/23 0501  WBC 6.9 7.0 6.2  HGB  10.4* 11.1* 10.4*  HCT 30.9* 33.3* 31.8*  MCV 98.1 97.9 101.9*  PLT 122* 142* 228   Cardiac Enzymes: No results for input(s): "CKTOTAL", "CKMB", "CKMBINDEX", "TROPONINI" in the last 168 hours. BNP: Invalid input(s): "POCBNP" CBG: Recent Labs  Lab 05/05/23 0725 05/06/23 0751 05/07/23 0801 05/08/23 0759 05/09/23 0716  GLUCAP 82 70 109* 82 83   HbA1C: No results for input(s): "HGBA1C" in the last 72 hours. Urine analysis:    Component Value Date/Time   COLORURINE YELLOW 04/11/2016 1044   APPEARANCEUR CLEAR 04/11/2016 1044   LABSPEC 1.011 04/11/2016 1044   PHURINE 7.5 04/11/2016 1044   GLUCOSEU NEGATIVE 04/11/2016 1044   HGBUR NEGATIVE 04/11/2016 1044   BILIRUBINUR NEGATIVE 04/11/2016 1044   KETONESUR NEGATIVE 04/11/2016 1044   PROTEINUR NEGATIVE 04/11/2016 1044   UROBILINOGEN 0.2 10/29/2014 0937   NITRITE NEGATIVE 04/11/2016 1044   LEUKOCYTESUR NEGATIVE 04/11/2016 1044   Sepsis Labs: @LABRCNTIP (procalcitonin:4,lacticidven:4) )No results found for this or any previous visit (from the past 240 hour(s)).   Scheduled Meds:  carvedilol  6.25 mg Oral BID WC   folic acid  1 mg Intravenous Daily   gabapentin  200 mg Oral TID   lactulose  30 g Oral BID   linaclotide  290 mcg Oral QAC breakfast   multivitamin with minerals  1 tablet Oral Daily   nicotine  21 mg Transdermal Daily   pantoprazole  40 mg Oral Daily   polyethylene glycol  17 g Oral TID   prednisoLONE  40 mg Oral Daily   sodium chloride flush  3 mL Intravenous Q12H   sodium chloride  1 g Oral BID WC   sucralfate  1 g Oral BID   thiamine  100 mg Oral Daily   Or   thiamine  100 mg Intravenous Daily   traZODone  100 mg Oral QHS   Continuous Infusions:  sodium chloride 10 mL/hr at 05/08/23 1227   potassium chloride 10 mEq (05/09/23 1455)    Procedures/Studies: DG Abd 1 View  Result Date: 05/08/2023 CLINICAL DATA:  Ileus. EXAM: ABDOMEN - 1 VIEW COMPARISON:  May 04, 2023. FINDINGS: Stable mildly dilated  and air-filled large and small bowel loops are noted. No abnormal calcifications are noted. IMPRESSION: Stable bowel dilatation is noted above most consistent with ileus. Electronically Signed   By: Lupita Raider M.D.   On: 05/08/2023 15:03   CT ABDOMEN PELVIS W CONTRAST  Result Date: 05/05/2023 CLINICAL DATA:  Abdominal pain and distention EXAM: CT ABDOMEN AND PELVIS WITH CONTRAST TECHNIQUE: Multidetector CT imaging of the abdomen and pelvis was performed using the standard protocol following bolus administration of intravenous contrast. RADIATION DOSE REDUCTION: This exam was performed according to the departmental dose-optimization program which includes automated exposure control, adjustment of the mA and/or kV according to patient size and/or use of iterative  reconstruction technique. CONTRAST:  OMNIPAQUE IOHEXOL 300 MG/ML  SOLN COMPARISON:  Abdominal CT 05/01/2023. CT abdomen and pelvis 04/28/2023. FINDINGS: Lower chest: No acute abnormality. Hepatobiliary: Diffusely heterogeneous and hypoattenuating as seen on the prior study compatible with fatty infiltration and likely cirrhosis. Gallbladder and bile ducts are within normal limits. Pancreas: Unremarkable. No pancreatic ductal dilatation or surrounding inflammatory changes. Spleen: Normal in size without focal abnormality. Adrenals/Urinary Tract: Left renal cysts are present measuring up to 3.2 cm. There is no hydronephrosis. The adrenal glands bladder are within normal limits. Stomach/Bowel: Colon is diffusely distended with air-fluid levels. No transition point. No focal inflammation. The appendix is not seen. Small bowel and stomach are within normal limits. Vascular/Lymphatic: Aortic atherosclerosis. No enlarged abdominal or pelvic lymph nodes. Reproductive: Prostate is unremarkable. Other: There is trace ascites. There is diffuse abdominal wall edema. Musculoskeletal: No acute or significant osseous findings. IMPRESSION: 1. Colon is  diffusely distended with air-fluid levels. No transition point. Findings are compatible with ileus. 2. Cirrhotic and fatty infiltration of the liver. 3. Trace ascites. 4. Body wall edema. 5. Left Bosniak I benign renal cyst measuring 3.2 cm. No follow-up imaging is recommended. JACR 2018 Feb; 264-273, Management of the Incidental Renal Mass on CT, [employer] 2021; 814-848, Bosniak Classification of Cystic Renal Masses, Version 2019. Aortic Atherosclerosis (ICD10-I70.0). Electronically Signed   By: Darliss Cheney M.D.   On: 05/05/2023 18:57   US Venous Img Lower Bilateral (DVT)  Result Date: 05/05/2023 CLINICAL DATA:  Bilateral lower extremity pain and edema for the past 2 weeks. History of smoking. Evaluate for DVT. EXAM: BILATERAL LOWER EXTREMITY VENOUS DOPPLER ULTRASOUND TECHNIQUE: Gray-scale sonography with graded compression, as well as color Doppler and duplex ultrasound were performed to evaluate the lower extremity deep venous systems from the level of the common femoral vein and including the common femoral, femoral, profunda femoral, popliteal and calf veins including the posterior tibial, peroneal and gastrocnemius veins when visible. The superficial great saphenous vein was also interrogated. Spectral Doppler was utilized to evaluate flow at rest and with distal augmentation maneuvers in the common femoral, femoral and popliteal veins. COMPARISON:  None Available. FINDINGS: RIGHT LOWER EXTREMITY Common Femoral Vein: No evidence of thrombus. Normal compressibility, respiratory phasicity and response to augmentation. Saphenofemoral Junction: No evidence of thrombus. Normal compressibility and flow on color Doppler imaging. Profunda Femoral Vein: No evidence of thrombus. Normal compressibility and flow on color Doppler imaging. Femoral Vein: No evidence of thrombus. Normal compressibility, respiratory phasicity and response to augmentation. Popliteal Vein: No evidence of thrombus. Normal  compressibility, respiratory phasicity and response to augmentation. Rouleaux flow is noted within the left popliteal vein. Calf Veins: No evidence of thrombus. Normal compressibility and flow on color Doppler imaging. Superficial Great Saphenous Vein: No evidence of thrombus. Normal compressibility. Other Findings:  None. LEFT LOWER EXTREMITY Common Femoral Vein: No evidence of thrombus. Normal compressibility, respiratory phasicity and response to augmentation. Saphenofemoral Junction: No evidence of thrombus. Normal compressibility and flow on color Doppler imaging. Profunda Femoral Vein: No evidence of thrombus. Normal compressibility and flow on color Doppler imaging. Femoral Vein: No evidence of thrombus. Normal compressibility, respiratory phasicity and response to augmentation. Popliteal Vein: No evidence of thrombus. Normal compressibility, respiratory phasicity and response to augmentation. Calf Veins: No evidence of thrombus. Normal compressibility and flow on color Doppler imaging. Superficial Great Saphenous Vein: No evidence of thrombus. Normal compressibility. Other Findings:  None. IMPRESSION: No evidence of DVT within either lower extremity. Electronically Signed   By: Jonny Ruiz  Watts M.D.   On: 05/05/2023 16:40   DG Abd 2 Views  Result Date: 05/04/2023 CLINICAL DATA:  Abdominal distension EXAM: ABDOMEN - 2 VIEW COMPARISON:  05/01/2023, 04/29/2023 FINDINGS: Progressive gaseous distension of both large and small bowel loops throughout the abdomen. No gross free intraperitoneal air. IMPRESSION: Progressive gaseous distension of both large and small bowel loops throughout the abdomen. Pattern favors ileus. Obstruction not excluded. Electronically Signed   By: Duanne Guess D.O.   On: 05/04/2023 18:54   [place] ASCITES (ABDOMEN LIMITED)  Result Date: 05/04/2023 CLINICAL DATA:  Abdominal ascites. EXAM: LIMITED ABDOMEN ULTRASOUND FOR ASCITES TECHNIQUE: Limited ultrasound survey for ascites was performed  in all four abdominal quadrants. COMPARISON:  CT of the abdomen 05/01/2023 FINDINGS: Small volume ascites is present in the right lower quadrant and at the midline. No significant fluid pockets are present to allow for paracentesis. IMPRESSION: Stable appearance of small volume ascites. Electronically Signed   By: Marin Roberts M.D.   On: 05/04/2023 12:41   CT ABDOMEN WO CONTRAST  Result Date: 05/01/2023 CLINICAL DATA:  Inpatient. Extensive left flank bruising. Generalized abdominal pain. No known injury. EXAM: CT ABDOMEN WITHOUT CONTRAST TECHNIQUE: Multidetector CT imaging of the abdomen was performed following the standard protocol without IV contrast. RADIATION DOSE REDUCTION: This exam was performed according to the departmental dose-optimization program which includes automated exposure control, adjustment of the mA and/or kV according to patient size and/or use of iterative reconstruction technique. COMPARISON:  04/28/2023 CT abdomen/pelvis. FINDINGS: Lower chest: No significant pulmonary nodules or acute consolidative airspace disease. Coronary atherosclerosis. Hepatobiliary: Severe diffuse hepatic steatosis. Relative hypertrophy of caudate lobe with lobulated liver contour, cannot exclude cirrhosis. No discrete liver mass on this noncontrast exam. Somewhat contracted gallbladder with minimal layering hyperdense material, favoring vicarious excretion of contrast from recent IV contrast CT study, without definite gallbladder wall thickening. No biliary ductal dilatation. Pancreas: Normal, with no mass or duct dilation. Spleen: Normal size. No mass. Adrenals/Urinary Tract: Normal adrenals. No renal stones. No hydronephrosis. Simple 3.4 cm anterior interpolar left renal cyst, for which no follow-up imaging is recommended. Stomach/Bowel: Normal non-distended stomach. Visualized small and large bowel is normal caliber, with no bowel wall thickening. Vascular/Lymphatic: Atherosclerotic nonaneurysmal  abdominal aorta. Mild paraumbilical and ventral right abdominal varices. No pathologically enlarged lymph nodes in the abdomen. Other: Small volume perihepatic ascites. No focal fluid collection. No pneumoperitoneum. Mild-to-moderate anasarca is worsened. No evidence of retroperitoneal hematoma. Musculoskeletal: No aggressive appearing focal osseous lesions. Mild thoracolumbar spondylosis. IMPRESSION: 1. No evidence of retroperitoneal hematoma. 2. Severe diffuse hepatic steatosis. Relative hypertrophy of the caudate lobe with lobulated liver contour, cannot exclude cirrhosis. No discrete liver mass on this noncontrast exam. 3. Small volume perihepatic ascites. Mild paraumbilical and ventral right abdominal varices. Mild-to-moderate anasarca is worsened. 4. Coronary atherosclerosis. 5.  Aortic Atherosclerosis (ICD10-I70.0). Electronically Signed   By: Delbert Phenix M.D.   On: 05/01/2023 19:23   US ABDOMEN LIMITED WITH LIVER DOPPLER  Result Date: 04/30/2023 CLINICAL DATA:  52 year old male with history of cirrhosis. EXAM: DUPLEX ULTRASOUND OF LIVER TECHNIQUE: Color and duplex Doppler ultrasound was performed to evaluate the hepatic in-flow and out-flow vessels. COMPARISON:  CT abdomen pelvis from 04/28/2023, 02/01/2013 FINDINGS: Liver: Diffusely increased echogenicity. Glisson's capsule is invisible amongst the portal veins. Mildly nodular contour. No focal lesion, mass or intrahepatic biliary ductal dilatation. Main Portal Vein size: 0.7 cm Portal Vein Velocities Main Prox:  51 cm/sec, hepatofugal Main Mid: 36 cm/sec, hepatofugal Main Dist:  38 cm/sec, hepatofugal  Right: Not visualized. Left: 24 cm/sec, hepatofugal Hepatic Vein Velocities Right:  53 cm/sec, antegrade Middle:  36 cm/sec, antegrade Left:  32 cm/sec, antegrade IVC: Not visualized. Hepatic Artery Velocity:  173 cm/sec Splenic Vein Velocity:  28 cm/sec Spleen: 12.7 cm x 12.8 cm x 6.2 cm with a total volume of 529 cm^3 (411 cm^3 is upper limit normal)  Portal Vein Occlusion/Thrombus: No Splenic Vein Occlusion/Thrombus: No Ascites: Trace Varices: None IMPRESSION: 1. Diffuse severe echogenicity throughout the hepatic parenchyma as could be seen with severe hepatic steatosis or in the acute setting of hepatitis. 2. Morphologic changes of cirrhosis with portal hypertension is evidence by mild splenomegaly, trace ascites, and hepatofugal flow the visualized portal system which is widely patent. Marliss Coots, MD [facility] Electronically Signed   By: Marliss Coots M.D.   On: 04/30/2023 15:35   DG Abd 1 View  Result Date: 04/29/2023 CLINICAL DATA:  Abdominal distension. EXAM: ABDOMEN - 1 VIEW COMPARISON:  CT abdomen/pelvis 04/27/2021 FINDINGS: Mildly prominent gaseous distension of colon with nonobstructive bowel gas pattern. No abnormal calcifications. Degenerative changes of the spine. IMPRESSION: Nonobstructive bowel gas pattern. Mildly prominent gaseous distension of colon. Electronically Signed   By: Feliberto Harts M.D.   On: 04/29/2023 16:58   [place] ASCITES (ABDOMEN LIMITED)  Result Date: 04/29/2023 CLINICAL DATA:  Ascites Interventional radiology consulted for paracentesis EXAM: LIMITED ABDOMEN ULTRASOUND FOR ASCITES TECHNIQUE: Limited ultrasound survey for ascites was performed in all four abdominal quadrants. COMPARISON:  CT abdomen pelvis 04/28/2023 FINDINGS: Sonographic evaluation of the abdomen demonstrates trace perihepatic ascites which is insufficient for aspiration. IMPRESSION: Trace perihepatic ascites, insufficient for aspiration. No paracentesis was performed. Electronically Signed   By: Acquanetta Belling M.D.   On: 04/29/2023 09:56   CT ABDOMEN PELVIS W CONTRAST  Result Date: 04/28/2023 CLINICAL DATA:  Bowel obstruction suspected. Acute, nonlocalized abdominal pain. EXAM: CT ABDOMEN AND PELVIS WITH CONTRAST TECHNIQUE: Multidetector CT imaging of the abdomen and pelvis was performed  using the standard protocol following bolus administration of intravenous contrast. RADIATION DOSE REDUCTION: This exam was performed according to the departmental dose-optimization program which includes automated exposure control, adjustment of the mA and/or kV according to patient size and/or use of iterative reconstruction technique. CONTRAST:  OMNIPAQUE IOHEXOL 300 MG/ML  SOLN COMPARISON:  09/24/2021 FINDINGS: Lower chest:  Coronary atherosclerosis. Hepatobiliary: Severe hepatic steatosis. Lobulated liver surface with newly enlarged caudate and further widened fissures.No evidence of biliary obstruction or stone. Pancreas: Unremarkable. Spleen: Unremarkable. Adrenals/Urinary Tract: Negative adrenals. No hydronephrosis or stone. Left renal hilar and cortical cysts with simple appearance measuring up to 3.2 cm at the renal hilum. Unremarkable bladder. Stomach/Bowel: No obstruction. No appendicitis other visible bowel inflammation. Vascular/Lymphatic: No acute vascular abnormality. Scattered atheromatous calcification. No mass or adenopathy. Reproductive:No pathologic findings. Other: New small volume ascites distributed throughout the abdomen including at the small umbilical hernia. Musculoskeletal: No acute abnormalities. Generalized degeneration with mild scoliosis. IMPRESSION: 1. Severe hepatic steatosis with progressive findings of cirrhosis since 2023. Small volume ascites is newly seen. 2. Extensive atherosclerosis. Electronically Signed   By: Tiburcio Pea M.D.   On: 04/28/2023 05:30    Catarina Hartshorn, DO  [facility]  If 7PM-7AM, please contact night-coverage [email or web address] Password [email or web address] 05/09/2023, 4:05 PM   LOS: 11 days

## 2023-05-09 NOTE — Progress Notes (Signed)
Gastroenterology & Hepatology   Interval History: Patient reports generalized shaking on standing up.  Continues to have small amount of diarrhea and not an adequate bowel movement  Inpatient Medications:  Current Facility-Administered Medications:    0.9 %  sodium chloride infusion, , Intravenous, PRN, Kendell Bane, MD, Last Rate: 10 mL/hr at 05/08/23 1227, New Bag at 05/08/23 1227   ALPRAZolam (XANAX) tablet 0.5 mg, 0.5 mg, Oral, TID PRN, Flossie Dibble, Seyed A, MD, 0.5 mg at 05/09/23 0555   carvedilol (COREG) tablet 6.25 mg, 6.25 mg, Oral, BID WC, Johnson, Clanford L, MD, 6.25 mg at 05/09/23 1610   folic acid (FOLVITE) tablet 1 mg, 1 mg, Oral, Daily, Horton, Mayer Masker, MD, 1 mg at 05/08/23 0802   [START ON 05/12/2023] furosemide (LASIX) tablet 40 mg, 40 mg, Oral, BID, Margaretann Abate, Juanetta Beets, MD   gabapentin (NEURONTIN) capsule 200 mg, 200 mg, Oral, TID, Shahmehdi, Seyed A, MD, 200 mg at 05/09/23 9604   guaiFENesin-dextromethorphan (ROBITUSSIN DM) 100-10 MG/5ML syrup 5 mL, 5 mL, Oral, Q4H PRN, Shahmehdi, Seyed A, MD, 5 mL at 05/06/23 2223   HYDROmorphone (DILAUDID) injection 0.5 mg, 0.5 mg, Intravenous, Q3H PRN, Flossie Dibble, Seyed A, MD, 0.5 mg at 05/09/23 5409   hydrOXYzine (ATARAX) tablet 25 mg, 25 mg, Oral, TID PRN, Shahmehdi, Seyed A, MD, 25 mg at 05/07/23 1304   ipratropium (ATROVENT) nebulizer solution 0.5 mg, 0.5 mg, Nebulization, Q6H PRN, Shahmehdi, Seyed A, MD   lactulose (CHRONULAC) 10 GM/15ML solution 30 g, 30 g, Oral, BID, Shahmehdi, Seyed A, MD, 30 g at 05/08/23 2141   levalbuterol (XOPENEX) nebulizer solution 0.63 mg, 0.63 mg, Nebulization, Q6H PRN, Shahmehdi, Seyed A, MD   linaclotide (LINZESS) capsule 290 mcg, 290 mcg, Oral, QAC breakfast, Tiffany Kocher, PA-C, 290 mcg at 05/09/23 0831   liver oil-zinc oxide (DESITIN) 40 % ointment, , Topical, BID PRN, Adefeso, Oladapo, DO   multivitamin with minerals tablet 1 tablet, 1 tablet, Oral, Daily, Horton, Mayer Masker, MD, 1 tablet at  05/09/23 0834   nicotine (NICODERM CQ - dosed in mg/24 hours) patch 21 mg, 21 mg, Transdermal, Daily, Shahmehdi, Seyed A, MD, 21 mg at 05/09/23 0851   ondansetron (ZOFRAN) tablet 4 mg, 4 mg, Oral, Q6H PRN **OR** ondansetron (ZOFRAN) injection 4 mg, 4 mg, Intravenous, Q6H PRN, Shahmehdi, Seyed A, MD, 4 mg at 05/05/23 2150   Oral care mouth rinse, 15 mL, Mouth Rinse, PRN, Johnson, Clanford L, MD   oxyCODONE (Oxy IR/ROXICODONE) immediate release tablet 15 mg, 15 mg, Oral, Q6H PRN, Johnson, Clanford L, MD, 15 mg at 05/09/23 0555   pantoprazole (PROTONIX) EC tablet 40 mg, 40 mg, Oral, Daily, Shahmehdi, Seyed A, MD, 40 mg at 05/09/23 0834   polyethylene glycol (MIRALAX / GLYCOLAX) packet 17 g, 17 g, Oral, TID, Mahon, Courtney L, NP, 17 g at 05/09/23 8119   prednisoLONE tablet 40 mg, 40 mg, Oral, Daily, Carlan, Chelsea L, NP, 40 mg at 05/09/23 1214   sodium chloride flush (NS) 0.9 % injection 3 mL, 3 mL, Intravenous, Q12H, Shahmehdi, Seyed A, MD, 3 mL at 05/09/23 0853   sodium chloride tablet 1 g, 1 g, Oral, BID WC, Shahmehdi, Seyed A, MD, 1 g at 05/09/23 1478   sodium phosphate (FLEET) enema 1 enema, 1 enema, Rectal, Once PRN, Shahmehdi, Seyed A, MD   [START ON 05/12/2023] spironolactone (ALDACTONE) tablet 50 mg, 50 mg, Oral, Daily, Eustolia Drennen, Juanetta Beets, MD   sucralfate (CARAFATE) 1 GM/10ML suspension 1 g, 1 g, Oral, BID, Shahmehdi, Seyed  A, MD, 1 g at 05/09/23 1610   thiamine (VITAMIN B1) tablet 100 mg, 100 mg, Oral, Daily, 100 mg at 05/09/23 0842 **OR** thiamine (VITAMIN B1) injection 100 mg, 100 mg, Intravenous, Daily, Horton, Mayer Masker, MD   traZODone (DESYREL) tablet 100 mg, 100 mg, Oral, QHS, Alexander, Dorene Grebe, DO, 100 mg at 05/08/23 2142   zolpidem (AMBIEN) tablet 5 mg, 5 mg, Oral, QHS PRN,MR X 1, Shahmehdi, Seyed A, MD, 5 mg at 05/08/23 0148   I/O    Intake/Output Summary (Last 24 hours) at 05/09/2023 1217 Last data filed at 05/09/2023 0900 Gross per 24 hour  Intake 360 ml  Output 2403 ml  Net  -2043 ml     Physical Exam: Temp:  [97.9 F (36.6 C)-98.7 F (37.1 C)] 98 F (36.7 C) (08/24 0543) Pulse Rate:  [72-85] 76 (08/24 0543) Resp:  [16-24] 22 (08/24 0543) BP: (110-136)/(77-99) 131/91 (08/24 0543) SpO2:  [94 %-100 %] 100 % (08/24 0543) Weight:  [106.8 kg] 106.8 kg (08/24 0543)  Temp (24hrs), Avg:98.1 F (36.7 C), Min:97.9 F (36.6 C), Max:98.7 F (37.1 C)  GENERAL: The patient is AO x3, HEENT: Head is normocephalic and atraumatic. EOMI are intact. Mouth is well hydrated and without lesions. NECK: Supple. No masses LUNGS: Clear to auscultation. No presence of rhonchi/wheezing/rales. Adequate chest expansion HEART: RRR, normal s1 and s2. ABDOMEN: Soft, diffusely tender , no guarding, no peritoneal signs, and nondistended. Diminished BS  Laboratory Data: CBC:     Component Value Date/Time   WBC 6.2 05/09/2023 0501   RBC 3.12 (L) 05/09/2023 0501   HGB 10.4 (L) 05/09/2023 0501   HCT 31.8 (L) 05/09/2023 0501   PLT 228 05/09/2023 0501   MCV 101.9 (H) 05/09/2023 0501   MCH 33.3 05/09/2023 0501   MCHC 32.7 05/09/2023 0501   RDW 20.4 (H) 05/09/2023 0501   LYMPHSABS 1.5 04/28/2023 0346   MONOABS 0.8 04/28/2023 0346   EOSABS 0.1 04/28/2023 0346   BASOSABS 0.1 04/28/2023 0346   COAG:  Lab Results  Component Value Date   INR 1.7 (H) 05/09/2023   INR 1.7 (H) 05/08/2023   INR 1.6 (H) 05/07/2023    BMP:     Latest Ref Rng & Units 05/09/2023    5:01 AM 05/08/2023    4:38 AM 05/07/2023    4:27 AM  BMP  Glucose 70 - 99 mg/dL 960  454  098   BUN 6 - 20 mg/dL 11  11  8    Creatinine 0.61 - 1.24 mg/dL 1.19  1.47  8.29   Sodium 135 - 145 mmol/L 135  132  132   Potassium 3.5 - 5.1 mmol/L 3.0  2.9  3.6   Chloride 98 - 111 mmol/L 102  101  99   CO2 22 - 32 mmol/L 24  21  23    Calcium 8.9 - 10.3 mg/dL 9.3  9.4  9.5     HEPATIC:     Latest Ref Rng & Units 05/09/2023    5:01 AM 05/08/2023    4:38 AM 05/07/2023    4:27 AM  Hepatic Function  Total Protein 6.5 - 8.1 g/dL  7.5  7.5  7.7   Albumin 3.5 - 5.0 g/dL 3.7  4.0  4.2   AST 15 - 41 U/L 104  107  97   ALT 0 - 44 U/L 46  41  36   Alk Phosphatase 38 - 126 U/L 104  106  112   Total Bilirubin 0.3 -  1.2 mg/dL 5.4  5.5  7.5     CARDIAC:  Lab Results  Component Value Date   CKTOTAL 155 09/24/2021   TROPONINI 0.05 (HH) 04/11/2016      Imaging: I personally reviewed and interpreted the available labs, imaging and endoscopic files.    Assessment/Plan:  52 y.o. male with a history of Wolff-Parkinson-White syndrome s/p ablation, CAD, chronic pain, diastolic heart failure, GERD, HTN, adenomatous colon polyps, gastric ulcers, ulcerative esophagitis, alcohol abuse who presented to the hospital with findings of decompensated cirrhosis and abdominal pain.   GI is following the patient for decompensated cirrhosis with alcoholic hepatitis.   #Decompensated Cirrhosis  #Ileus  #Alcoholic hepatitis  MELD 3.0: 19 at 05/09/2023  5:01 AM MELD-Na: 20 at 05/09/2023  5:01 AM Calculated from: Serum Creatinine: 1.01 mg/dL at 1/61/0960  4:54 AM Serum Sodium: 135 mmol/L at 05/09/2023  5:01 AM Total Bilirubin: 5.4 mg/dL at 0/98/1191  4:78 AM Serum Albumin: 3.7 g/dL (Using max of 3.5 g/dL) at 2/95/6213  0:86 AM INR(ratio): 1.7 at 05/09/2023  5:01 AM Age at listing (hypothetical): 52 years Sex: Male at 05/09/2023  5:01 AM   Low folate : 3.6 Normal B 12 : 999 Pending abdominal xray  K:3.0 Mg: 2.1 INR: 1.7 Liver enzymes stable but T.bili trending down   Rec:  Pending repeat Abdominal xray   Continue prednisolone (day 4 today) and on day 7 will calculate Lille score to determine if continuation of steroids is feasible   continue bowel regimen.  Linzess and MiraLAX 3 times daily.Patient was encouraged to participate in PT and stay out of bed to chair most of the day.   Keep K>4 and Mg>2  Will hold diuretics until electrolytes are corrected   Folate and Thiamine supplementation   Vista Lawman,  MD Gastroenterology and Hepatology [facility] Gastroenterology   This chart has been completed using [facility] Dictation software, and while attempts have been made to ensure accuracy , certain words and phrases may not be transcribed as intended

## 2023-05-10 DIAGNOSIS — K7031 Alcoholic cirrhosis of liver with ascites: Secondary | ICD-10-CM | POA: Diagnosis not present

## 2023-05-10 DIAGNOSIS — E876 Hypokalemia: Secondary | ICD-10-CM | POA: Diagnosis not present

## 2023-05-10 DIAGNOSIS — K567 Ileus, unspecified: Secondary | ICD-10-CM | POA: Diagnosis not present

## 2023-05-10 LAB — GLUCOSE, CAPILLARY: Glucose-Capillary: 98 mg/dL (ref 70–99)

## 2023-05-10 LAB — COMPREHENSIVE METABOLIC PANEL
ALT: 51 U/L — ABNORMAL HIGH (ref 0–44)
AST: 103 U/L — ABNORMAL HIGH (ref 15–41)
Albumin: 3.6 g/dL (ref 3.5–5.0)
Alkaline Phosphatase: 104 U/L (ref 38–126)
Anion gap: 7 (ref 5–15)
BUN: 11 mg/dL (ref 6–20)
CO2: 23 mmol/L (ref 22–32)
Calcium: 9.3 mg/dL (ref 8.9–10.3)
Chloride: 103 mmol/L (ref 98–111)
Creatinine, Ser: 0.97 mg/dL (ref 0.61–1.24)
GFR, Estimated: >60 mL/min (ref >60)
Glucose, Bld: 99 mg/dL (ref 70–99)
Potassium: 4 mmol/L (ref 3.5–5.1)
Sodium: 133 mmol/L — ABNORMAL LOW (ref 135–145)
Total Bilirubin: 4.5 mg/dL — ABNORMAL HIGH (ref 0.3–1.2)
Total Protein: 7.5 g/dL (ref 6.5–8.1)

## 2023-05-10 LAB — MAGNESIUM: Magnesium: 2 mg/dL (ref 1.7–2.4)

## 2023-05-10 LAB — PHOSPHORUS: Phosphorus: 2.6 mg/dL (ref 2.5–4.6)

## 2023-05-10 LAB — PROTIME-INR
INR: 1.6 — ABNORMAL HIGH (ref 0.8–1.2)
Prothrombin Time: 19 s — ABNORMAL HIGH (ref 11.4–15.2)

## 2023-05-10 MED ORDER — PANTOPRAZOLE SODIUM 40 MG PO TBEC
40.0000 mg | DELAYED_RELEASE_TABLET | Freq: Two times a day (BID) | ORAL | Status: DC
  Administered 2023-05-10 – 2023-05-12 (×4): 40 mg via ORAL
  Filled 2023-05-10 (×4): qty 1

## 2023-05-10 MED ORDER — MAGNESIUM SULFATE IN D5W 1-5 GM/100ML-% IV SOLN
1.0000 g | Freq: Once | INTRAVENOUS | Status: AC
  Administered 2023-05-10: 1 g via INTRAVENOUS
  Filled 2023-05-10: qty 100

## 2023-05-10 MED ORDER — POTASSIUM CHLORIDE CRYS ER 20 MEQ PO TBCR
30.0000 meq | EXTENDED_RELEASE_TABLET | Freq: Once | ORAL | Status: AC
  Administered 2023-05-10: 30 meq via ORAL
  Filled 2023-05-10: qty 1

## 2023-05-10 NOTE — Progress Notes (Signed)
PROGRESS NOTE  Timothy [name] UJW:[id number] DOB: 12-Apr-1971 DOA: 04/28/2023 PCP: Elfredia Nevins, MD  Brief History:  Timothy [name] is a 52 year old male with history of HTN, Barrett esophagus, PUD, WPW status post ablation, NICM, alcohol abuse, and liver disease who presents with worsening abdominal distention and pain. 08/13: to ED. Mult electrolyte derangement, low Plt. CT of the abdomen pelvis with IV contrast showed presence of new cirrhotic changes with small volume of ascites. Admitted w/ decompensated cirrhosis/ascites. GI consult. Plan paracentesis. Plan start furosemide/spironolactone when electrolytes are improved 08/14: Ultrasound today reveals trace perihepatic ascites insufficient for tap. Subsequent plain films revealed nonspecific bowel gas pattern with some increase in colonic air. No evidence of obstruction, impaction or free air. Plan liver doppler.  08/15: Liver doppler done. Add spironolactone 100mg  daily/continue lasix 40mg  daily. More aggressive bowel regimen given no signs of obstruction and narcotic use. Will start Linzess daily. Can titrate accordingly.  08/16: Liver Doppler studies with severe steatosis, portal hypertension and patent portal vein. Start lactulose 10 g twice daily. Holding diuretics. Repeated CT to eval L flank bruise and drop Hgb, no hematoma 08/17: still off lasix/spiro, increase lactulose to 20 bid. Stable to transfer out of stepdown. Follow GI recs - consider repeat US / attempt paracentesis?  08/18: BP and K improved, restart lasix/spiro today. Of note, pt reported concern for when he was down for his MRI, he was moved roughly by staff and this maybe caused the bruising,   05/04/2023: Improving, low sodium, potassium..  Sodium potassium repleted 05/05/2023: Reporting of increased abdominal pain, distention, feeling weak, constipated, 05/06/2023: Complain of generalized weaknesses, generalized aches and pain, still having abdominal  distention 05/07/2023: Lethargic, complaining of abdominal pain, hemodynamically stable, much improved labs -pending insurance authorization and approval for discharge to SNF 05/08/23--pt sitting up in chair.  Had 3 loose BM last 24 hours.  Complains abd pain.  Abd distended.  KUB ordered 05/09/23--pt in better spirits.  Had 4 BMs.  KUB shows stable bowel dilatation.  No vomiting 05/10/23--ambulated hall with walker 50 feet.  3 BMs last 24 hours.  Abd distended but shows some improvement       ASSESSMENT & PLAN:  Decompensated Alcoholic Liver Cirrhosis  / Generalized Abdominal pain Progressive alcoholic liver cirrhosis and severe steatosis resulting in portal HTN, ascites Hyponatremia d/t liver disease,  Thrombocytopenia d/t liver disease   -Repeat ultrasound, and CT scan did not reveal enough fluid for paracentesis) -MELD score of 25, Maddrey DF up to 38-prednisolone started on 05/06/23 -Liver Doppler without evidence of PVT.   No signs of SBP.  GI following -appreciated discussed with Dr. Tasia Catchings Coreg 6.25 mg bid Lasix  40 mg po bid>>hold 05/09/23 until K improved Spiro at 50 mg po daily >>hold 05/09/23 until K improved Lactulose 30g bid  2g sodium diet  Absolute EtOH cessation  Needs outpatient colonoscopy for surveillance purposes. Can consider EGD at the same time to evaluate for varices as well as to screen for Barrett's esophagus.  05/05/23 CT scan of abdomenDiffuse colonic distention with air-fluid, possible ileus, liver cirrhosis and fatty infiltration, trace ascites, body wall edema, benign renal cyst 3.2 cm:   Ileus Complicated by opiate dependence,  Lactulose as above  BM x 4 in past 24 hours Linzess 290 mcg daily  Repeat KUB--stable bowel dilatation Optimize electrolytes Ambulate at least once daily  Anemia stable no evidence of active GI bleeding.  Previous EGD with ulcerative  esophagitis.  Continue to monitor hemoglobin no plans for inpatient endoscopic evaluation   Continue PPI twice daily.   Bilateral flank bruising No retroperitoneal hematoma on CT 8/20 Monitor on physical exam Monitor Hgb--overall stable   Hypomagnesemia /Hypokalemia/hyponatremia/hypophosphatemia Replace as needed Much improved  Monitor BMP, Mg, phos discontinue sodium supplements--contributing to his fluid retention   Barrett's esophagus without dysplasia GERD Continue PPI twice daily.   Alcohol Abuser/History of DTs Monitoring closely, following CIWA protocol continue daily thiamine, folate replacement  Absolute EtOH cessation    Opioid dependence - Chronic Pain  Opioid induced chronic constipation  Pt reports being on opioids since 1990s and has withdrawal symptoms if not receiving resume his home oxycodone 15 mg Q6h prn, may consider increase dose as wean off IV meds, plan taper this as well outpatient  PDMP reviewed  Oxycodone 15 mg, #120, last refill 03/31/23   Chronic HFmrEF Last echo 12/19/2020:  LVEF 45-50%, mildly decreased LV function moderate LVH, grade 1 diastolic dysfunction impaired relaxation Monitor I's and O's Limit IV fluid resuscitation Repeat echo  History of Wolff-Parkinson-White syndrome Status post ablation, monitoring  H/o hypertension -hypotensive Was persistently hypotensive, medications were held, BP is improving  Dyslipidemia Holding statins due to elevated LFTs due to alcohol use abuse liver cirrhosis     Consultants:  Gastroenterology   Procedures: None    DVT prophylaxis: SCD d/t low Plt Pertinent IV fluids/nutrition: no continuous IV fluids  Central lines / invasive devices: none  Code Status: FULL CODE ACP documentation reviewed: 05/04/23 none on file   Current Admission Status: inpatient TOC needs / Dispo plan: SNF rehab when medically stable   Barriers to discharge / significant pending items:  Not medically stable            Subjective: Pt had 3 BMs without blood.  Denies cp, sob.  Complains of  abd pain--slow improvement.  Denies f/c, cough  Objective: Vitals:   05/09/23 1500 05/09/23 2027 05/10/23 0546 05/10/23 1429  BP: (!) 117/96 117/84 126/82 129/85  Pulse: 73 70 73 75  Resp: (!) 22 20 20 18   Temp: 97.8 F (36.6 C) 98.6 F (37 C) 98.3 F (36.8 C) 98 F (36.7 C)  TempSrc:    Oral  SpO2: 96% 99% 97% 96%  Weight:   109 kg   Height:        Intake/Output Summary (Last 24 hours) at 05/10/2023 1711 Last data filed at 05/10/2023 0609 Gross per 24 hour  Intake 240 ml  Output 800 ml  Net -560 ml   Weight change: 2.2 kg Exam:  General:  Pt is alert, follows commands appropriately, not in acute distress HEENT: No icterus, No thrush, No neck mass, [place]/AT Cardiovascular: RRR, S1/S2, no rubs, no gallops Respiratory: bibasilar rales.  No wheeze Abdomen: Soft/+BS, lower abd tender, mildly distended, no guarding Extremities: trace LE edema, No lymphangitis, No petechiae, No rashes, no synovitis   Data Reviewed: I have personally reviewed following labs and imaging studies Basic Metabolic Panel: Recent Labs  Lab 05/06/23 0443 05/07/23 0427 05/08/23 0438 05/09/23 0501 05/10/23 0612  NA 133* 132* 132* 135 133*  K 3.1* 3.6 2.9* 3.0* 4.0  CL 102 99 101 102 103  CO2 21* 23 21* 24 23  GLUCOSE 89 111* 111* 105* 99  BUN 8 8 11 11 11   CREATININE 0.85 0.99 1.04 1.01 0.97  CALCIUM 9.2 9.5 9.4 9.3 9.3  MG 1.8  --   --  2.1 2.0  PHOS  --   --   --  2.0* 2.6   Liver Function Tests: Recent Labs  Lab 05/06/23 0443 05/07/23 0427 05/08/23 0438 05/09/23 0501 05/10/23 0612  AST 101* 97* 107* 104* 103*  ALT 32 36 41 46* 51*  ALKPHOS 125 112 106 104 104  BILITOT 7.3* 7.5* 5.5* 5.4* 4.5*  PROT 6.7 7.7 7.5 7.5 7.5  ALBUMIN 3.2* 4.2 4.0 3.7 3.6   Recent Labs  Lab 05/05/23 1713  LIPASE 76*   No results for input(s): "AMMONIA" in the last 168 hours. Coagulation Profile: Recent Labs  Lab 05/06/23 0443 05/07/23 0427 05/08/23 0438 05/09/23 0501 05/10/23 0612  INR 1.6*  1.6* 1.7* 1.7* 1.6*   CBC: Recent Labs  Lab 05/04/23 0530 05/09/23 0501  WBC 7.0 6.2  HGB 11.1* 10.4*  HCT 33.3* 31.8*  MCV 97.9 101.9*  PLT 142* 228   Cardiac Enzymes: No results for input(s): "CKTOTAL", "CKMB", "CKMBINDEX", "TROPONINI" in the last 168 hours. BNP: Invalid input(s): "POCBNP" CBG: Recent Labs  Lab 05/06/23 0751 05/07/23 0801 05/08/23 0759 05/09/23 0716 05/10/23 0725  GLUCAP 70 109* 82 83 98   HbA1C: No results for input(s): "HGBA1C" in the last 72 hours. Urine analysis:    Component Value Date/Time   COLORURINE YELLOW 04/11/2016 1044   APPEARANCEUR CLEAR 04/11/2016 1044   LABSPEC 1.011 04/11/2016 1044   PHURINE 7.5 04/11/2016 1044   GLUCOSEU NEGATIVE 04/11/2016 1044   HGBUR NEGATIVE 04/11/2016 1044   BILIRUBINUR NEGATIVE 04/11/2016 1044   KETONESUR NEGATIVE 04/11/2016 1044   PROTEINUR NEGATIVE 04/11/2016 1044   UROBILINOGEN 0.2 10/29/2014 0937   NITRITE NEGATIVE 04/11/2016 1044   LEUKOCYTESUR NEGATIVE 04/11/2016 1044   Sepsis Labs: @LABRCNTIP (procalcitonin:4,lacticidven:4) )No results found for this or any previous visit (from the past 240 hour(s)).   Scheduled Meds:  carvedilol  6.25 mg Oral BID WC   folic acid  1 mg Intravenous Daily   gabapentin  200 mg Oral TID   lactulose  30 g Oral BID   linaclotide  290 mcg Oral QAC breakfast   multivitamin with minerals  1 tablet Oral Daily   nicotine  21 mg Transdermal Daily   pantoprazole  40 mg Oral BID   phosphorus  500 mg Oral BID   polyethylene glycol  17 g Oral TID   potassium chloride  30 mEq Oral Once   prednisoLONE  40 mg Oral Daily   sodium chloride flush  3 mL Intravenous Q12H   sucralfate  1 g Oral BID   thiamine  100 mg Oral Daily   Or   thiamine  100 mg Intravenous Daily   traZODone  100 mg Oral QHS   Continuous Infusions:  sodium chloride 10 mL/hr at 05/08/23 1227   magnesium sulfate bolus IVPB      Procedures/Studies: DG Abd 1 View  Result Date: 05/08/2023 CLINICAL  DATA:  Ileus. EXAM: ABDOMEN - 1 VIEW COMPARISON:  May 04, 2023. FINDINGS: Stable mildly dilated and air-filled large and small bowel loops are noted. No abnormal calcifications are noted. IMPRESSION: Stable bowel dilatation is noted above most consistent with ileus. Electronically Signed   By: Lupita Raider M.D.   On: 05/08/2023 15:03   CT ABDOMEN PELVIS W CONTRAST  Result Date: 05/05/2023 CLINICAL DATA:  Abdominal pain and distention EXAM: CT ABDOMEN AND PELVIS WITH CONTRAST TECHNIQUE: Multidetector CT imaging of the abdomen and pelvis was performed using the standard protocol following bolus administration of intravenous contrast. RADIATION DOSE REDUCTION: This exam was performed according to the departmental dose-optimization program which includes automated  exposure control, adjustment of the mA and/or kV according to patient size and/or use of iterative reconstruction technique. CONTRAST:  OMNIPAQUE IOHEXOL 300 MG/ML  SOLN COMPARISON:  Abdominal CT 05/01/2023. CT abdomen and pelvis 04/28/2023. FINDINGS: Lower chest: No acute abnormality. Hepatobiliary: Diffusely heterogeneous and hypoattenuating as seen on the prior study compatible with fatty infiltration and likely cirrhosis. Gallbladder and bile ducts are within normal limits. Pancreas: Unremarkable. No pancreatic ductal dilatation or surrounding inflammatory changes. Spleen: Normal in size without focal abnormality. Adrenals/Urinary Tract: Left renal cysts are present measuring up to 3.2 cm. There is no hydronephrosis. The adrenal glands bladder are within normal limits. Stomach/Bowel: Colon is diffusely distended with air-fluid levels. No transition point. No focal inflammation. The appendix is not seen. Small bowel and stomach are within normal limits. Vascular/Lymphatic: Aortic atherosclerosis. No enlarged abdominal or pelvic lymph nodes. Reproductive: Prostate is unremarkable. Other: There is trace ascites. There is diffuse abdominal wall  edema. Musculoskeletal: No acute or significant osseous findings. IMPRESSION: 1. Colon is diffusely distended with air-fluid levels. No transition point. Findings are compatible with ileus. 2. Cirrhotic and fatty infiltration of the liver. 3. Trace ascites. 4. Body wall edema. 5. Left Bosniak I benign renal cyst measuring 3.2 cm. No follow-up imaging is recommended. JACR 2018 Feb; 264-273, Management of the Incidental Renal Mass on CT, [employer] 2021; 814-848, Bosniak Classification of Cystic Renal Masses, Version 2019. Aortic Atherosclerosis (ICD10-I70.0). Electronically Signed   By: Darliss Cheney M.D.   On: 05/05/2023 18:57   US Venous Img Lower Bilateral (DVT)  Result Date: 05/05/2023 CLINICAL DATA:  Bilateral lower extremity pain and edema for the past 2 weeks. History of smoking. Evaluate for DVT. EXAM: BILATERAL LOWER EXTREMITY VENOUS DOPPLER ULTRASOUND TECHNIQUE: Gray-scale sonography with graded compression, as well as color Doppler and duplex ultrasound were performed to evaluate the lower extremity deep venous systems from the level of the common femoral vein and including the common femoral, femoral, profunda femoral, popliteal and calf veins including the posterior tibial, peroneal and gastrocnemius veins when visible. The superficial great saphenous vein was also interrogated. Spectral Doppler was utilized to evaluate flow at rest and with distal augmentation maneuvers in the common femoral, femoral and popliteal veins. COMPARISON:  None Available. FINDINGS: RIGHT LOWER EXTREMITY Common Femoral Vein: No evidence of thrombus. Normal compressibility, respiratory phasicity and response to augmentation. Saphenofemoral Junction: No evidence of thrombus. Normal compressibility and flow on color Doppler imaging. Profunda Femoral Vein: No evidence of thrombus. Normal compressibility and flow on color Doppler imaging. Femoral Vein: No evidence of thrombus. Normal compressibility, respiratory phasicity  and response to augmentation. Popliteal Vein: No evidence of thrombus. Normal compressibility, respiratory phasicity and response to augmentation. Rouleaux flow is noted within the left popliteal vein. Calf Veins: No evidence of thrombus. Normal compressibility and flow on color Doppler imaging. Superficial Great Saphenous Vein: No evidence of thrombus. Normal compressibility. Other Findings:  None. LEFT LOWER EXTREMITY Common Femoral Vein: No evidence of thrombus. Normal compressibility, respiratory phasicity and response to augmentation. Saphenofemoral Junction: No evidence of thrombus. Normal compressibility and flow on color Doppler imaging. Profunda Femoral Vein: No evidence of thrombus. Normal compressibility and flow on color Doppler imaging. Femoral Vein: No evidence of thrombus. Normal compressibility, respiratory phasicity and response to augmentation. Popliteal Vein: No evidence of thrombus. Normal compressibility, respiratory phasicity and response to augmentation. Calf Veins: No evidence of thrombus. Normal compressibility and flow on color Doppler imaging. Superficial Great Saphenous Vein: No evidence of thrombus. Normal compressibility. Other Findings:  None. IMPRESSION: No evidence of DVT within either lower extremity. Electronically Signed   By: Simonne Come M.D.   On: 05/05/2023 16:40   DG Abd 2 Views  Result Date: 05/04/2023 CLINICAL DATA:  Abdominal distension EXAM: ABDOMEN - 2 VIEW COMPARISON:  05/01/2023, 04/29/2023 FINDINGS: Progressive gaseous distension of both large and small bowel loops throughout the abdomen. No gross free intraperitoneal air. IMPRESSION: Progressive gaseous distension of both large and small bowel loops throughout the abdomen. Pattern favors ileus. Obstruction not excluded. Electronically Signed   By: Duanne Guess D.O.   On: 05/04/2023 18:54   [place] ASCITES (ABDOMEN LIMITED)  Result Date: 05/04/2023 CLINICAL DATA:  Abdominal ascites. EXAM: LIMITED ABDOMEN  ULTRASOUND FOR ASCITES TECHNIQUE: Limited ultrasound survey for ascites was performed in all four abdominal quadrants. COMPARISON:  CT of the abdomen 05/01/2023 FINDINGS: Small volume ascites is present in the right lower quadrant and at the midline. No significant fluid pockets are present to allow for paracentesis. IMPRESSION: Stable appearance of small volume ascites. Electronically Signed   By: Marin Roberts M.D.   On: 05/04/2023 12:41   CT ABDOMEN WO CONTRAST  Result Date: 05/01/2023 CLINICAL DATA:  Inpatient. Extensive left flank bruising. Generalized abdominal pain. No known injury. EXAM: CT ABDOMEN WITHOUT CONTRAST TECHNIQUE: Multidetector CT imaging of the abdomen was performed following the standard protocol without IV contrast. RADIATION DOSE REDUCTION: This exam was performed according to the departmental dose-optimization program which includes automated exposure control, adjustment of the mA and/or kV according to patient size and/or use of iterative reconstruction technique. COMPARISON:  04/28/2023 CT abdomen/pelvis. FINDINGS: Lower chest: No significant pulmonary nodules or acute consolidative airspace disease. Coronary atherosclerosis. Hepatobiliary: Severe diffuse hepatic steatosis. Relative hypertrophy of caudate lobe with lobulated liver contour, cannot exclude cirrhosis. No discrete liver mass on this noncontrast exam. Somewhat contracted gallbladder with minimal layering hyperdense material, favoring vicarious excretion of contrast from recent IV contrast CT study, without definite gallbladder wall thickening. No biliary ductal dilatation. Pancreas: Normal, with no mass or duct dilation. Spleen: Normal size. No mass. Adrenals/Urinary Tract: Normal adrenals. No renal stones. No hydronephrosis. Simple 3.4 cm anterior interpolar left renal cyst, for which no follow-up imaging is recommended. Stomach/Bowel: Normal non-distended stomach. Visualized small and large bowel is normal caliber,  with no bowel wall thickening. Vascular/Lymphatic: Atherosclerotic nonaneurysmal abdominal aorta. Mild paraumbilical and ventral right abdominal varices. No pathologically enlarged lymph nodes in the abdomen. Other: Small volume perihepatic ascites. No focal fluid collection. No pneumoperitoneum. Mild-to-moderate anasarca is worsened. No evidence of retroperitoneal hematoma. Musculoskeletal: No aggressive appearing focal osseous lesions. Mild thoracolumbar spondylosis. IMPRESSION: 1. No evidence of retroperitoneal hematoma. 2. Severe diffuse hepatic steatosis. Relative hypertrophy of the caudate lobe with lobulated liver contour, cannot exclude cirrhosis. No discrete liver mass on this noncontrast exam. 3. Small volume perihepatic ascites. Mild paraumbilical and ventral right abdominal varices. Mild-to-moderate anasarca is worsened. 4. Coronary atherosclerosis. 5.  Aortic Atherosclerosis (ICD10-I70.0). Electronically Signed   By: Delbert Phenix M.D.   On: 05/01/2023 19:23   US ABDOMEN LIMITED WITH LIVER DOPPLER  Result Date: 04/30/2023 CLINICAL DATA:  52 year old male with history of cirrhosis. EXAM: DUPLEX ULTRASOUND OF LIVER TECHNIQUE: Color and duplex Doppler ultrasound was performed to evaluate the hepatic in-flow and out-flow vessels. COMPARISON:  CT abdomen pelvis from 04/28/2023, 02/01/2013 FINDINGS: Liver: Diffusely increased echogenicity. Glisson's capsule is invisible amongst the portal veins. Mildly nodular contour. No focal lesion, mass or intrahepatic biliary ductal dilatation. Main Portal Vein size: 0.7 cm Portal Vein Velocities  Main Prox:  51 cm/sec, hepatofugal Main Mid: 36 cm/sec, hepatofugal Main Dist:  38 cm/sec, hepatofugal Right: Not visualized. Left: 24 cm/sec, hepatofugal Hepatic Vein Velocities Right:  53 cm/sec, antegrade Middle:  36 cm/sec, antegrade Left:  32 cm/sec, antegrade IVC: Not visualized. Hepatic Artery Velocity:  173 cm/sec Splenic Vein Velocity:  28 cm/sec Spleen: 12.7 cm x  12.8 cm x 6.2 cm with a total volume of 529 cm^3 (411 cm^3 is upper limit normal) Portal Vein Occlusion/Thrombus: No Splenic Vein Occlusion/Thrombus: No Ascites: Trace Varices: None IMPRESSION: 1. Diffuse severe echogenicity throughout the hepatic parenchyma as could be seen with severe hepatic steatosis or in the acute setting of hepatitis. 2. Morphologic changes of cirrhosis with portal hypertension is evidence by mild splenomegaly, trace ascites, and hepatofugal flow the visualized portal system which is widely patent. Marliss Coots, MD [facility] Electronically Signed   By: Marliss Coots M.D.   On: 04/30/2023 15:35   DG Abd 1 View  Result Date: 04/29/2023 CLINICAL DATA:  Abdominal distension. EXAM: ABDOMEN - 1 VIEW COMPARISON:  CT abdomen/pelvis 04/27/2021 FINDINGS: Mildly prominent gaseous distension of colon with nonobstructive bowel gas pattern. No abnormal calcifications. Degenerative changes of the spine. IMPRESSION: Nonobstructive bowel gas pattern. Mildly prominent gaseous distension of colon. Electronically Signed   By: Feliberto Harts M.D.   On: 04/29/2023 16:58   [place] ASCITES (ABDOMEN LIMITED)  Result Date: 04/29/2023 CLINICAL DATA:  Ascites Interventional radiology consulted for paracentesis EXAM: LIMITED ABDOMEN ULTRASOUND FOR ASCITES TECHNIQUE: Limited ultrasound survey for ascites was performed in all four abdominal quadrants. COMPARISON:  CT abdomen pelvis 04/28/2023 FINDINGS: Sonographic evaluation of the abdomen demonstrates trace perihepatic ascites which is insufficient for aspiration. IMPRESSION: Trace perihepatic ascites, insufficient for aspiration. No paracentesis was performed. Electronically Signed   By: Acquanetta Belling M.D.   On: 04/29/2023 09:56   CT ABDOMEN PELVIS W CONTRAST  Result Date: 04/28/2023 CLINICAL DATA:  Bowel obstruction suspected. Acute, nonlocalized abdominal pain. EXAM: CT ABDOMEN AND PELVIS WITH  CONTRAST TECHNIQUE: Multidetector CT imaging of the abdomen and pelvis was performed using the standard protocol following bolus administration of intravenous contrast. RADIATION DOSE REDUCTION: This exam was performed according to the departmental dose-optimization program which includes automated exposure control, adjustment of the mA and/or kV according to patient size and/or use of iterative reconstruction technique. CONTRAST:  OMNIPAQUE IOHEXOL 300 MG/ML  SOLN COMPARISON:  09/24/2021 FINDINGS: Lower chest:  Coronary atherosclerosis. Hepatobiliary: Severe hepatic steatosis. Lobulated liver surface with newly enlarged caudate and further widened fissures.No evidence of biliary obstruction or stone. Pancreas: Unremarkable. Spleen: Unremarkable. Adrenals/Urinary Tract: Negative adrenals. No hydronephrosis or stone. Left renal hilar and cortical cysts with simple appearance measuring up to 3.2 cm at the renal hilum. Unremarkable bladder. Stomach/Bowel: No obstruction. No appendicitis other visible bowel inflammation. Vascular/Lymphatic: No acute vascular abnormality. Scattered atheromatous calcification. No mass or adenopathy. Reproductive:No pathologic findings. Other: New small volume ascites distributed throughout the abdomen including at the small umbilical hernia. Musculoskeletal: No acute abnormalities. Generalized degeneration with mild scoliosis. IMPRESSION: 1. Severe hepatic steatosis with progressive findings of cirrhosis since 2023. Small volume ascites is newly seen. 2. Extensive atherosclerosis. Electronically Signed   By: Tiburcio Pea M.D.   On: 04/28/2023 05:30    Catarina Hartshorn, DO  [facility]  If 7PM-7AM, please contact night-coverage [email or web address] Password [email or web address] 05/10/2023, 5:11 PM   LOS: 12 days

## 2023-05-10 NOTE — Progress Notes (Signed)
Pt son Timothy [name] took home all previously listed belongings, pt was aware and agreed to have soon take all items home.Both were educated in patients room about policy of home medications not being allowed in hospital and vaping not being allowed in hospital.

## 2023-05-10 NOTE — Progress Notes (Signed)
Pt walked with this nurse with his front wheel walker approx 50 feet. He did have to pause several times d/t pain but he did very well. Notified MD

## 2023-05-10 NOTE — Progress Notes (Signed)
Pt is emotional and stated "I am going to hell for my actions. I did this to myself." Informed pt that we have a chaplain if he would like someone to talk to. He stated "Yes but can it please be tomorrow."

## 2023-05-10 NOTE — Progress Notes (Signed)
Upon rounding on patient with previous shift, pt found attempting to open a Loperamide pt states that it is just a mint that his son brought him. Educated patient that he cannot take medications in the hospital without staff giving them to him. This RN attempted to see contents of bag patient had in bed. Initially patient refused. This RN called Ahmed Prima who came to room pt eventually allowed [place] to look in bag where we found 1 silver vap, I bottle of Nicotine Lozenges, the unopened Loperamide, 3 wrappers to loperamide tabs, 3 wrappers to loperamide gell caps, and one 1/2 opened loperamide gel cap, as well as 3 green round pills that patient states are nerve pill. All items put in patient belongings seal envelope and given to [facility].

## 2023-05-10 NOTE — Progress Notes (Signed)
Pt got up and urinated in floor, complete bed change preformed and 3 addition 'nerve pills found' and another vape.   Belongings placed in bag [id number] and given to Ahmed Prima to place in pharmacy until patients family member gets here to pick them up. Pt states his son is on the way to pick up belongings.

## 2023-05-11 DIAGNOSIS — K7030 Alcoholic cirrhosis of liver without ascites: Secondary | ICD-10-CM | POA: Diagnosis not present

## 2023-05-11 DIAGNOSIS — I10 Essential (primary) hypertension: Secondary | ICD-10-CM | POA: Diagnosis not present

## 2023-05-11 DIAGNOSIS — K7031 Alcoholic cirrhosis of liver with ascites: Secondary | ICD-10-CM | POA: Diagnosis not present

## 2023-05-11 DIAGNOSIS — R7989 Other specified abnormal findings of blood chemistry: Secondary | ICD-10-CM | POA: Diagnosis not present

## 2023-05-11 LAB — CBC
HCT: 33.5 % — ABNORMAL LOW (ref 39.0–52.0)
Hemoglobin: 10.8 g/dL — ABNORMAL LOW (ref 13.0–17.0)
MCH: 32.8 pg (ref 26.0–34.0)
MCHC: 32.2 g/dL (ref 30.0–36.0)
MCV: 101.8 fL — ABNORMAL HIGH (ref 80.0–100.0)
Platelets: 249 10*3/uL (ref 150–400)
RBC: 3.29 MIL/uL — ABNORMAL LOW (ref 4.22–5.81)
RDW: 19.8 % — ABNORMAL HIGH (ref 11.5–15.5)
WBC: 7.4 10*3/uL (ref 4.0–10.5)
nRBC: 0 % (ref 0.0–0.2)

## 2023-05-11 LAB — COMPREHENSIVE METABOLIC PANEL
ALT: 53 U/L — ABNORMAL HIGH (ref 0–44)
AST: 92 U/L — ABNORMAL HIGH (ref 15–41)
Albumin: 3.5 g/dL (ref 3.5–5.0)
Alkaline Phosphatase: 95 U/L (ref 38–126)
Anion gap: 10 (ref 5–15)
BUN: 12 mg/dL (ref 6–20)
CO2: 22 mmol/L (ref 22–32)
Calcium: 9.4 mg/dL (ref 8.9–10.3)
Chloride: 102 mmol/L (ref 98–111)
Creatinine, Ser: 1.01 mg/dL (ref 0.61–1.24)
GFR, Estimated: >60 mL/min (ref >60)
Glucose, Bld: 89 mg/dL (ref 70–99)
Potassium: 3.7 mmol/L (ref 3.5–5.1)
Sodium: 134 mmol/L — ABNORMAL LOW (ref 135–145)
Total Bilirubin: 4.4 mg/dL — ABNORMAL HIGH (ref 0.3–1.2)
Total Protein: 7.5 g/dL (ref 6.5–8.1)

## 2023-05-11 LAB — GLUCOSE, CAPILLARY
Glucose-Capillary: 83 mg/dL (ref 70–99)
Glucose-Capillary: 89 mg/dL (ref 70–99)

## 2023-05-11 LAB — PHOSPHORUS: Phosphorus: 3.5 mg/dL (ref 2.5–4.6)

## 2023-05-11 LAB — PROTIME-INR
INR: 1.6 — ABNORMAL HIGH (ref 0.8–1.2)
Prothrombin Time: 18.9 s — ABNORMAL HIGH (ref 11.4–15.2)

## 2023-05-11 LAB — MAGNESIUM: Magnesium: 2.1 mg/dL (ref 1.7–2.4)

## 2023-05-11 MED ORDER — KETOROLAC TROMETHAMINE 15 MG/ML IJ SOLN
15.0000 mg | Freq: Once | INTRAMUSCULAR | Status: AC
  Administered 2023-05-11: 15 mg via INTRAVENOUS
  Filled 2023-05-11: qty 1

## 2023-05-11 MED ORDER — MAGNESIUM OXIDE -MG SUPPLEMENT 400 (240 MG) MG PO TABS
400.0000 mg | ORAL_TABLET | Freq: Two times a day (BID) | ORAL | Status: DC
  Administered 2023-05-11 – 2023-05-12 (×2): 400 mg via ORAL
  Filled 2023-05-11 (×2): qty 1

## 2023-05-11 MED ORDER — POTASSIUM CHLORIDE CRYS ER 20 MEQ PO TBCR
40.0000 meq | EXTENDED_RELEASE_TABLET | Freq: Once | ORAL | Status: AC
  Administered 2023-05-11: 40 meq via ORAL
  Filled 2023-05-11: qty 2

## 2023-05-11 NOTE — Care Management Important Message (Signed)
Important Message  Patient Details  Name: Timothy [name] MRN: [id number] Date of Birth: [birth year]   Medicare Important Message Given:  Yes     Corey Harold 05/11/2023, 10:25 AM

## 2023-05-11 NOTE — Progress Notes (Signed)
PROGRESS NOTE  Timothy [name]:[id number] DOB: Mar 31, 1971 DOA: 04/28/2023 PCP: Elfredia Nevins, MD  Brief History:  Timothy [name] is a 52 year old male with history of HTN, Barrett esophagus, PUD, WPW status post ablation, NICM, alcohol abuse, and liver disease who presents with worsening abdominal distention and pain. 08/13: to ED. Mult electrolyte derangement, low Plt. CT of the abdomen pelvis with IV contrast showed presence of new cirrhotic changes with small volume of ascites. Admitted w/ decompensated cirrhosis/ascites. GI consult. Plan paracentesis. Plan start furosemide/spironolactone when electrolytes are improved 08/14: Ultrasound today reveals trace perihepatic ascites insufficient for tap. Subsequent plain films revealed nonspecific bowel gas pattern with some increase in colonic air. No evidence of obstruction, impaction or free air. Plan liver doppler.  08/15: Liver doppler done. Add spironolactone 100mg  daily/continue lasix 40mg  daily. More aggressive bowel regimen given no signs of obstruction and narcotic use. Will start Linzess daily. Can titrate accordingly.  08/16: Liver Doppler studies with severe steatosis, portal hypertension and patent portal vein. Start lactulose 10 g twice daily. Holding diuretics. Repeated CT to eval L flank bruise and drop Hgb, no hematoma 08/17: still off lasix/spiro, increase lactulose to 20 bid. Stable to transfer out of stepdown. Follow GI recs - consider repeat US / attempt paracentesis?  08/18: BP and K improved, restart lasix/spiro today. Of note, pt reported concern for when he was down for his MRI, he was moved roughly by staff and this maybe caused the bruising,   05/04/2023: Improving, low sodium, potassium..  Sodium potassium repleted 05/05/2023: Reporting of increased abdominal pain, distention, feeling weak, constipated, 05/06/2023: Complain of generalized weaknesses, generalized aches and pain, still having abdominal  distention 05/07/2023: Lethargic, complaining of abdominal pain, hemodynamically stable, much improved labs -pending insurance authorization and approval for discharge to SNF 05/08/23--pt sitting up in chair.  Had 3 loose BM last 24 hours.  Complains abd pain.  Abd distended.  KUB ordered 05/09/23--pt in better spirits.  Had 4 BMs.  KUB shows stable bowel dilatation.  No vomiting 05/10/23--ambulated hall with walker 50 feet.  3 BMs last 24 hours.  Abd distended but shows some improvement 05/11/23--ambulated hall 50 feet.  3 BMs.  Abdomen less distended.  Tolerating diet       ASSESSMENT & PLAN:  Decompensated Alcoholic Liver Cirrhosis  / Generalized Abdominal pain Progressive alcoholic liver cirrhosis and severe steatosis resulting in portal HTN, ascites Hyponatremia d/t liver disease,  Thrombocytopenia d/t liver disease   -Repeat ultrasound, and CT scan did not reveal enough fluid for paracentesis) -MELD score of 25, Maddrey DF up to 38-prednisolone started on 05/06/23 -Liver Doppler without evidence of PVT.   No signs of SBP.  GI following -appreciated discussed with Dr. Tasia Catchings Coreg 6.25 mg bid Lasix  40 mg po bid>>hold 05/09/23 until K improved Spiro at 50 mg po daily >>hold 05/09/23 until K improved Lactulose 30g bid  2g sodium diet  Absolute EtOH cessation  Needs outpatient colonoscopy for surveillance purposes. Can consider EGD at the same time to evaluate for varices as well as to screen for Barrett's esophagus.  05/05/23 CT scan of abdomenDiffuse colonic distention with air-fluid, possible ileus, liver cirrhosis and fatty infiltration, trace ascites, body wall edema, benign renal cyst 3.2 cm:  Plan to restart lasix/spiro 8/26  Ileus Complicated by opiate dependence,  Lactulose as above  BM x 4 in past 24 hours Linzess 290 mcg daily  8/23 KUB--stable bowel dilatation Optimize electrolytes  Ambulate at least once daily  Anemia stable no evidence of active GI bleeding.   Previous EGD with ulcerative esophagitis.  Continue to monitor hemoglobin no plans for inpatient endoscopic evaluation  Continue PPI twice daily.   Bilateral flank bruising No retroperitoneal hematoma on CT 8/20 Monitor on physical exam Monitor Hgb--overall stable   Hypomagnesemia /Hypokalemia/hyponatremia/hypophosphatemia Replace as needed Much improved  Monitor BMP, Mg, phos discontinue sodium supplements--contributing to his fluid retention   Barrett's esophagus without dysplasia GERD Continue PPI twice daily.   Alcohol Abuser/History of DTs Monitoring closely, following CIWA protocol continue daily thiamine, folate replacement  Absolute EtOH cessation    Opioid dependence - Chronic Pain  Opioid induced chronic constipation  Pt reports being on opioids since 1990s and has withdrawal symptoms if not receiving resume his home oxycodone 15 mg Q6h prn, may consider increase dose as wean off IV meds, plan taper this as well outpatient  PDMP reviewed  Oxycodone 15 mg, #120, last refill 03/31/23   Chronic HFmrEF Last echo 12/19/2020:  LVEF 45-50%, mildly decreased LV function moderate LVH, grade 1 diastolic dysfunction impaired relaxation Monitor I's and O's Limit IV fluid resuscitation Repeat echo  History of Wolff-Parkinson-White syndrome Status post ablation, monitoring  H/o hypertension -hypotensive Was persistently hypotensive, medications were held, BP is improving  Dyslipidemia Holding statins due to elevated LFTs due to alcohol use abuse liver cirrhosis     Consultants:  Gastroenterology   Procedures: None    DVT prophylaxis: SCD d/t low Plt Pertinent IV fluids/nutrition: no continuous IV fluids  Central lines / invasive devices: none  Code Status: FULL CODE ACP documentation reviewed: 05/04/23 none on file   Current Admission Status: inpatient TOC needs / Dispo plan: SNF rehab when medically stable   Barriers to discharge / significant pending  items:  Not medically stable      Assessment/Plan:   Principal Problem:   Alcoholic cirrhosis (HCC) Active Problems:   Abdominal pain   Anxiety state   Barrett's esophagus without dysplasia   Hypomagnesemia   Essential hypertension   Hypokalemia   History of Wolff-Parkinson-White syndrome   GERD (gastroesophageal reflux disease)   Acute CHF (congestive heart failure) (HCC)   ETOH abuse   Dyslipidemia   Elevated LFTs   Alcohol withdrawal/DTs   Ileus (HCC)  Assessment and Plan: * Alcoholic cirrhosis (HCC) - CT scan reviewed, progressive liver cirrhosis, with a small amount of ascites -Consulting GI -Consulting IR for possible thoracentesis  Abdominal pain - Due to progressive liver cirrhosis, ascites, distention -Monitor closely, continue as needed IV/p.o. analgesics -Consulted IR for possible thoracentesis -CT scan consistent with thoracentesis, diabetes  Hypomagnesemia - Replacing with 2 g of IV magnesium  Barrett's esophagus without dysplasia - Monitoring continue PPI  Anxiety state - CIWA protocol -As needed Ativan  Alcohol withdrawal/DTs -Monitoring closely, initiate CIWA protocol -Will replace thiamine, folate,  Elevated LFTs - Avoid nephrotoxin, including statins -Monitoring LFTs -treating alcohol use and abuse    Latest Ref Rng & Units 04/28/2023    3:46 AM 09/21/2022   12:25 AM 09/20/2022   12:30 AM  Hepatic Function  Total Protein 6.5 - 8.1 g/dL 7.3  7.5  7.0   Albumin 3.5 - 5.0 g/dL 2.6  2.7  2.3   AST 15 - 41 U/L 174  91  60   ALT 0 - 44 U/L 42  34  23   Alk Phosphatase 38 - 126 U/L 217  187  174   Total Bilirubin 0.3 -  1.2 mg/dL 3.6  1.3  1.2      Dyslipidemia - Holding statins due to elevated LFTs due to alcohol use abuse liver cirrhosis  ETOH abuse -Has been counseled regarding alcohol use/abuse, now developing liver cirrhosis, ascites -Patient has been initiated on CIWA protocol  Acute CHF (congestive heart failure) (HCC) -  Monitor I's and O's, -Limit IV fluid resuscitation -Last echo 12/19/2020:  EJF40 5-50%, mildly decreased LV function moderate LV H, grade 1 diastolic dysfunction impaired relaxation  GERD (gastroesophageal reflux disease) Continue PPI  History of Wolff-Parkinson-White syndrome Status post ablation, monitoring  Hypokalemia -  Potassium of 2.4, replating IV -Due to nausea vomiting, liver cirrhosis alcohol use/abuse  Essential hypertension - Monitoring closely -Restarted home medication, with a as needed IV hydralazine             Subjective: Pt states abd is less distended.  Had 3 BMs in last 24 hours.  Denies cp, sob, n/v, f/c  Objective: Vitals:   05/11/23 0503 05/11/23 0509 05/11/23 0643 05/11/23 1451  BP: (!) 164/111  (!) 145/100 128/89  Pulse: 81  71 69  Resp: 17   14  Temp: 97.9 F (36.6 C)   98.1 F (36.7 C)  TempSrc: Oral   Oral  SpO2: 100%   97%  Weight:  107.4 kg    Height:        Intake/Output Summary (Last 24 hours) at 05/11/2023 1754 Last data filed at 05/11/2023 1740 Gross per 24 hour  Intake 360 ml  Output 1950 ml  Net -1590 ml   Weight change: 1 kg Exam:  General:  Pt is alert, follows commands appropriately, not in acute distress HEENT: No icterus, No thrush, No neck mass, [place]/AT Cardiovascular: RRR, S1/S2, no rubs, no gallops Respiratory: bibasilar rales.  No wheeze Abdomen: Soft/+BS, non tender, mildly distended, no guarding Extremities: No edema, No lymphangitis, No petechiae, No rashes, no synovitis   Data Reviewed: I have personally reviewed following labs and imaging studies Basic Metabolic Panel: Recent Labs  Lab 05/06/23 0443 05/07/23 0427 05/08/23 0438 05/09/23 0501 05/10/23 0612 05/11/23 0557  NA 133* 132* 132* 135 133* 134*  K 3.1* 3.6 2.9* 3.0* 4.0 3.7  CL 102 99 101 102 103 102  CO2 21* 23 21* 24 23 22   GLUCOSE 89 111* 111* 105* 99 89  BUN 8 8 11 11 11 12   CREATININE 0.85 0.99 1.04 1.01 0.97 1.01  CALCIUM 9.2  9.5 9.4 9.3 9.3 9.4  MG 1.8  --   --  2.1 2.0 2.1  PHOS  --   --   --  2.0* 2.6 3.5   Liver Function Tests: Recent Labs  Lab 05/07/23 0427 05/08/23 0438 05/09/23 0501 05/10/23 0612 05/11/23 0557  AST 97* 107* 104* 103* 92*  ALT 36 41 46* 51* 53*  ALKPHOS 112 106 104 104 95  BILITOT 7.5* 5.5* 5.4* 4.5* 4.4*  PROT 7.7 7.5 7.5 7.5 7.5  ALBUMIN 4.2 4.0 3.7 3.6 3.5   Recent Labs  Lab 05/05/23 1713  LIPASE 76*   No results for input(s): "AMMONIA" in the last 168 hours. Coagulation Profile: Recent Labs  Lab 05/07/23 0427 05/08/23 0438 05/09/23 0501 05/10/23 0612 05/11/23 0557  INR 1.6* 1.7* 1.7* 1.6* 1.6*   CBC: Recent Labs  Lab 05/09/23 0501 05/11/23 0557  WBC 6.2 7.4  HGB 10.4* 10.8*  HCT 31.8* 33.5*  MCV 101.9* 101.8*  PLT 228 249   Cardiac Enzymes: No results for input(s): "CKTOTAL", "CKMB", "  CKMBINDEX", "TROPONINI" in the last 168 hours. BNP: Invalid input(s): "POCBNP" CBG: Recent Labs  Lab 05/08/23 0759 05/09/23 0716 05/10/23 0725 05/11/23 0542 05/11/23 0725  GLUCAP 82 83 98 83 89   HbA1C: No results for input(s): "HGBA1C" in the last 72 hours. Urine analysis:    Component Value Date/Time   COLORURINE YELLOW 04/11/2016 1044   APPEARANCEUR CLEAR 04/11/2016 1044   LABSPEC 1.011 04/11/2016 1044   PHURINE 7.5 04/11/2016 1044   GLUCOSEU NEGATIVE 04/11/2016 1044   HGBUR NEGATIVE 04/11/2016 1044   BILIRUBINUR NEGATIVE 04/11/2016 1044   KETONESUR NEGATIVE 04/11/2016 1044   PROTEINUR NEGATIVE 04/11/2016 1044   UROBILINOGEN 0.2 10/29/2014 0937   NITRITE NEGATIVE 04/11/2016 1044   LEUKOCYTESUR NEGATIVE 04/11/2016 1044   Sepsis Labs: @LABRCNTIP (procalcitonin:4,lacticidven:4) )No results found for this or any previous visit (from the past 240 hour(s)).   Scheduled Meds:  carvedilol  6.25 mg Oral BID WC   folic acid  1 mg Intravenous Daily   gabapentin  200 mg Oral TID   lactulose  30 g Oral BID   linaclotide  290 mcg Oral QAC breakfast    magnesium oxide  400 mg Oral BID   multivitamin with minerals  1 tablet Oral Daily   nicotine  21 mg Transdermal Daily   pantoprazole  40 mg Oral BID   phosphorus  500 mg Oral BID   polyethylene glycol  17 g Oral TID   prednisoLONE  40 mg Oral Daily   sodium chloride flush  3 mL Intravenous Q12H   sucralfate  1 g Oral BID   thiamine  100 mg Oral Daily   Or   thiamine  100 mg Intravenous Daily   traZODone  100 mg Oral QHS   Continuous Infusions:  sodium chloride 10 mL/hr at 05/08/23 1227    Procedures/Studies: DG Abd 1 View  Result Date: 05/08/2023 CLINICAL DATA:  Ileus. EXAM: ABDOMEN - 1 VIEW COMPARISON:  May 04, 2023. FINDINGS: Stable mildly dilated and air-filled large and small bowel loops are noted. No abnormal calcifications are noted. IMPRESSION: Stable bowel dilatation is noted above most consistent with ileus. Electronically Signed   By: Lupita Raider M.D.   On: 05/08/2023 15:03   CT ABDOMEN PELVIS W CONTRAST  Result Date: 05/05/2023 CLINICAL DATA:  Abdominal pain and distention EXAM: CT ABDOMEN AND PELVIS WITH CONTRAST TECHNIQUE: Multidetector CT imaging of the abdomen and pelvis was performed using the standard protocol following bolus administration of intravenous contrast. RADIATION DOSE REDUCTION: This exam was performed according to the departmental dose-optimization program which includes automated exposure control, adjustment of the mA and/or kV according to patient size and/or use of iterative reconstruction technique. CONTRAST:  OMNIPAQUE IOHEXOL 300 MG/ML  SOLN COMPARISON:  Abdominal CT 05/01/2023. CT abdomen and pelvis 04/28/2023. FINDINGS: Lower chest: No acute abnormality. Hepatobiliary: Diffusely heterogeneous and hypoattenuating as seen on the prior study compatible with fatty infiltration and likely cirrhosis. Gallbladder and bile ducts are within normal limits. Pancreas: Unremarkable. No pancreatic ductal dilatation or surrounding inflammatory changes.  Spleen: Normal in size without focal abnormality. Adrenals/Urinary Tract: Left renal cysts are present measuring up to 3.2 cm. There is no hydronephrosis. The adrenal glands bladder are within normal limits. Stomach/Bowel: Colon is diffusely distended with air-fluid levels. No transition point. No focal inflammation. The appendix is not seen. Small bowel and stomach are within normal limits. Vascular/Lymphatic: Aortic atherosclerosis. No enlarged abdominal or pelvic lymph nodes. Reproductive: Prostate is unremarkable. Other: There is trace ascites. There is  diffuse abdominal wall edema. Musculoskeletal: No acute or significant osseous findings. IMPRESSION: 1. Colon is diffusely distended with air-fluid levels. No transition point. Findings are compatible with ileus. 2. Cirrhotic and fatty infiltration of the liver. 3. Trace ascites. 4. Body wall edema. 5. Left Bosniak I benign renal cyst measuring 3.2 cm. No follow-up imaging is recommended. JACR 2018 Feb; 264-273, Management of the Incidental Renal Mass on CT, [employer] 2021; [phone], Bosniak Classification of Cystic Renal Masses, Version 2019. Aortic Atherosclerosis (ICD10-I70.0). Electronically Signed   By: Darliss Cheney M.D.   On: 05/05/2023 18:57   US Venous Img Lower Bilateral (DVT)  Result Date: 05/05/2023 CLINICAL DATA:  Bilateral lower extremity pain and edema for the past 2 weeks. History of smoking. Evaluate for DVT. EXAM: BILATERAL LOWER EXTREMITY VENOUS DOPPLER ULTRASOUND TECHNIQUE: Gray-scale sonography with graded compression, as well as color Doppler and duplex ultrasound were performed to evaluate the lower extremity deep venous systems from the level of the common femoral vein and including the common femoral, femoral, profunda femoral, popliteal and calf veins including the posterior tibial, peroneal and gastrocnemius veins when visible. The superficial great saphenous vein was also interrogated. Spectral Doppler was utilized to evaluate  flow at rest and with distal augmentation maneuvers in the common femoral, femoral and popliteal veins. COMPARISON:  None Available. FINDINGS: RIGHT LOWER EXTREMITY Common Femoral Vein: No evidence of thrombus. Normal compressibility, respiratory phasicity and response to augmentation. Saphenofemoral Junction: No evidence of thrombus. Normal compressibility and flow on color Doppler imaging. Profunda Femoral Vein: No evidence of thrombus. Normal compressibility and flow on color Doppler imaging. Femoral Vein: No evidence of thrombus. Normal compressibility, respiratory phasicity and response to augmentation. Popliteal Vein: No evidence of thrombus. Normal compressibility, respiratory phasicity and response to augmentation. Rouleaux flow is noted within the left popliteal vein. Calf Veins: No evidence of thrombus. Normal compressibility and flow on color Doppler imaging. Superficial Great Saphenous Vein: No evidence of thrombus. Normal compressibility. Other Findings:  None. LEFT LOWER EXTREMITY Common Femoral Vein: No evidence of thrombus. Normal compressibility, respiratory phasicity and response to augmentation. Saphenofemoral Junction: No evidence of thrombus. Normal compressibility and flow on color Doppler imaging. Profunda Femoral Vein: No evidence of thrombus. Normal compressibility and flow on color Doppler imaging. Femoral Vein: No evidence of thrombus. Normal compressibility, respiratory phasicity and response to augmentation. Popliteal Vein: No evidence of thrombus. Normal compressibility, respiratory phasicity and response to augmentation. Calf Veins: No evidence of thrombus. Normal compressibility and flow on color Doppler imaging. Superficial Great Saphenous Vein: No evidence of thrombus. Normal compressibility. Other Findings:  None. IMPRESSION: No evidence of DVT within either lower extremity. Electronically Signed   By: Simonne Come M.D.   On: 05/05/2023 16:40   DG Abd 2 Views  Result Date:  05/04/2023 CLINICAL DATA:  Abdominal distension EXAM: ABDOMEN - 2 VIEW COMPARISON:  05/01/2023, 04/29/2023 FINDINGS: Progressive gaseous distension of both large and small bowel loops throughout the abdomen. No gross free intraperitoneal air. IMPRESSION: Progressive gaseous distension of both large and small bowel loops throughout the abdomen. Pattern favors ileus. Obstruction not excluded. Electronically Signed   By: Duanne Guess D.O.   On: 05/04/2023 18:54   [place] ASCITES (ABDOMEN LIMITED)  Result Date: 05/04/2023 CLINICAL DATA:  Abdominal ascites. EXAM: LIMITED ABDOMEN ULTRASOUND FOR ASCITES TECHNIQUE: Limited ultrasound survey for ascites was performed in all four abdominal quadrants. COMPARISON:  CT of the abdomen 05/01/2023 FINDINGS: Small volume ascites is present in the right lower quadrant and at the midline. No  significant fluid pockets are present to allow for paracentesis. IMPRESSION: Stable appearance of small volume ascites. Electronically Signed   By: Marin Roberts M.D.   On: 05/04/2023 12:41   CT ABDOMEN WO CONTRAST  Result Date: 05/01/2023 CLINICAL DATA:  Inpatient. Extensive left flank bruising. Generalized abdominal pain. No known injury. EXAM: CT ABDOMEN WITHOUT CONTRAST TECHNIQUE: Multidetector CT imaging of the abdomen was performed following the standard protocol without IV contrast. RADIATION DOSE REDUCTION: This exam was performed according to the departmental dose-optimization program which includes automated exposure control, adjustment of the mA and/or kV according to patient size and/or use of iterative reconstruction technique. COMPARISON:  04/28/2023 CT abdomen/pelvis. FINDINGS: Lower chest: No significant pulmonary nodules or acute consolidative airspace disease. Coronary atherosclerosis. Hepatobiliary: Severe diffuse hepatic steatosis. Relative hypertrophy of caudate lobe with lobulated liver contour, cannot exclude cirrhosis. No discrete liver mass on this  noncontrast exam. Somewhat contracted gallbladder with minimal layering hyperdense material, favoring vicarious excretion of contrast from recent IV contrast CT study, without definite gallbladder wall thickening. No biliary ductal dilatation. Pancreas: Normal, with no mass or duct dilation. Spleen: Normal size. No mass. Adrenals/Urinary Tract: Normal adrenals. No renal stones. No hydronephrosis. Simple 3.4 cm anterior interpolar left renal cyst, for which no follow-up imaging is recommended. Stomach/Bowel: Normal non-distended stomach. Visualized small and large bowel is normal caliber, with no bowel wall thickening. Vascular/Lymphatic: Atherosclerotic nonaneurysmal abdominal aorta. Mild paraumbilical and ventral right abdominal varices. No pathologically enlarged lymph nodes in the abdomen. Other: Small volume perihepatic ascites. No focal fluid collection. No pneumoperitoneum. Mild-to-moderate anasarca is worsened. No evidence of retroperitoneal hematoma. Musculoskeletal: No aggressive appearing focal osseous lesions. Mild thoracolumbar spondylosis. IMPRESSION: 1. No evidence of retroperitoneal hematoma. 2. Severe diffuse hepatic steatosis. Relative hypertrophy of the caudate lobe with lobulated liver contour, cannot exclude cirrhosis. No discrete liver mass on this noncontrast exam. 3. Small volume perihepatic ascites. Mild paraumbilical and ventral right abdominal varices. Mild-to-moderate anasarca is worsened. 4. Coronary atherosclerosis. 5.  Aortic Atherosclerosis (ICD10-I70.0). Electronically Signed   By: Delbert Phenix M.D.   On: 05/01/2023 19:23   US ABDOMEN LIMITED WITH LIVER DOPPLER  Result Date: 04/30/2023 CLINICAL DATA:  52 year old male with history of cirrhosis. EXAM: DUPLEX ULTRASOUND OF LIVER TECHNIQUE: Color and duplex Doppler ultrasound was performed to evaluate the hepatic in-flow and out-flow vessels. COMPARISON:  CT abdomen pelvis from 04/28/2023, 02/01/2013 FINDINGS: Liver: Diffusely  increased echogenicity. Glisson's capsule is invisible amongst the portal veins. Mildly nodular contour. No focal lesion, mass or intrahepatic biliary ductal dilatation. Main Portal Vein size: 0.7 cm Portal Vein Velocities Main Prox:  51 cm/sec, hepatofugal Main Mid: 36 cm/sec, hepatofugal Main Dist:  38 cm/sec, hepatofugal Right: Not visualized. Left: 24 cm/sec, hepatofugal Hepatic Vein Velocities Right:  53 cm/sec, antegrade Middle:  36 cm/sec, antegrade Left:  32 cm/sec, antegrade IVC: Not visualized. Hepatic Artery Velocity:  173 cm/sec Splenic Vein Velocity:  28 cm/sec Spleen: 12.7 cm x 12.8 cm x 6.2 cm with a total volume of 529 cm^3 (411 cm^3 is upper limit normal) Portal Vein Occlusion/Thrombus: No Splenic Vein Occlusion/Thrombus: No Ascites: Trace Varices: None IMPRESSION: 1. Diffuse severe echogenicity throughout the hepatic parenchyma as could be seen with severe hepatic steatosis or in the acute setting of hepatitis. 2. Morphologic changes of cirrhosis with portal hypertension is evidence by mild splenomegaly, trace ascites, and hepatofugal flow the visualized portal system which is widely patent. Marliss Coots, MD [facility] Electronically Signed   By: Domingo Dimes  Suttle M.D.   On: 04/30/2023 15:35   DG Abd 1 View  Result Date: 04/29/2023 CLINICAL DATA:  Abdominal distension. EXAM: ABDOMEN - 1 VIEW COMPARISON:  CT abdomen/pelvis 04/27/2021 FINDINGS: Mildly prominent gaseous distension of colon with nonobstructive bowel gas pattern. No abnormal calcifications. Degenerative changes of the spine. IMPRESSION: Nonobstructive bowel gas pattern. Mildly prominent gaseous distension of colon. Electronically Signed   By: Feliberto Harts M.D.   On: 04/29/2023 16:58   [place] ASCITES (ABDOMEN LIMITED)  Result Date: 04/29/2023 CLINICAL DATA:  Ascites Interventional radiology consulted for paracentesis EXAM: LIMITED ABDOMEN ULTRASOUND FOR ASCITES TECHNIQUE:  Limited ultrasound survey for ascites was performed in all four abdominal quadrants. COMPARISON:  CT abdomen pelvis 04/28/2023 FINDINGS: Sonographic evaluation of the abdomen demonstrates trace perihepatic ascites which is insufficient for aspiration. IMPRESSION: Trace perihepatic ascites, insufficient for aspiration. No paracentesis was performed. Electronically Signed   By: Acquanetta Belling M.D.   On: 04/29/2023 09:56   CT ABDOMEN PELVIS W CONTRAST  Result Date: 04/28/2023 CLINICAL DATA:  Bowel obstruction suspected. Acute, nonlocalized abdominal pain. EXAM: CT ABDOMEN AND PELVIS WITH CONTRAST TECHNIQUE: Multidetector CT imaging of the abdomen and pelvis was performed using the standard protocol following bolus administration of intravenous contrast. RADIATION DOSE REDUCTION: This exam was performed according to the departmental dose-optimization program which includes automated exposure control, adjustment of the mA and/or kV according to patient size and/or use of iterative reconstruction technique. CONTRAST:  OMNIPAQUE IOHEXOL 300 MG/ML  SOLN COMPARISON:  09/24/2021 FINDINGS: Lower chest:  Coronary atherosclerosis. Hepatobiliary: Severe hepatic steatosis. Lobulated liver surface with newly enlarged caudate and further widened fissures.No evidence of biliary obstruction or stone. Pancreas: Unremarkable. Spleen: Unremarkable. Adrenals/Urinary Tract: Negative adrenals. No hydronephrosis or stone. Left renal hilar and cortical cysts with simple appearance measuring up to 3.2 cm at the renal hilum. Unremarkable bladder. Stomach/Bowel: No obstruction. No appendicitis other visible bowel inflammation. Vascular/Lymphatic: No acute vascular abnormality. Scattered atheromatous calcification. No mass or adenopathy. Reproductive:No pathologic findings. Other: New small volume ascites distributed throughout the abdomen including at the small umbilical hernia. Musculoskeletal: No acute abnormalities. Generalized  degeneration with mild scoliosis. IMPRESSION: 1. Severe hepatic steatosis with progressive findings of cirrhosis since 2023. Small volume ascites is newly seen. 2. Extensive atherosclerosis. Electronically Signed   By: Tiburcio Pea M.D.   On: 04/28/2023 05:30    Catarina Hartshorn, DO  [facility]  If 7PM-7AM, please contact night-coverage [email or web address] Password [facility] 05/11/2023, 5:54 PM   LOS: 13 days

## 2023-05-11 NOTE — Consult Note (Signed)
[facility] Customer service manager [facility]) Accountable Care Organization (ACO) [facility] Liaison Note  05/11/2023  Timothy [name] [birth year] [id number]  Location: [facility] RN Hospital Liaison screened the patient remotely at [facility].  Insurance: [employer]   Timothy [name] is a 52 y.o. male who is a Primary Care Patient of Elfredia Nevins, MD at [facility]. The patient was screened for readmission hospitalization with noted high risk score for unplanned readmission risk with 1 IP in 6 months.  The patient was assessed for potential [facility]) Care Management service needs for post hospital transition for care coordination. Review of patient's electronic medical record reveals patient was admitted with Hypokalemia. Pt will be discharged to SNF level of care. The facility chosen will continue to monitor he needs.   [facility] Care Management/Population Health does not replace or interfere with any arrangements made by the Inpatient Transition of Care team.   For questions contact:   Elliot Cousin, RN, [facility] Liaison [place]   Population Health Office Hours MTWF  8:00 am-6:00 pm [phone] mobile [phone] [Office toll free line] Office Hours are M-F 8:30 - 5 pm [email or web address]
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
## 2023-05-11 NOTE — Progress Notes (Signed)
Gastroenterology Progress Note     Patient ID: LILE GO; [id number]; March 31, 1971    Subjective   States he almost fell last night X 2 trying to get out of bed to bedside commode. Multiple non-GI complaints today. States he did walk 45 steps yesterday and is proud.    Objective   Vital signs in last 24 hours Temp:  [97.7 F (36.5 C)-98 F (36.7 C)] 97.9 F (36.6 C) (08/26 0503) Pulse Rate:  [70-81] 71 (08/26 0643) Resp:  [17-18] 17 (08/26 0503) BP: (129-164)/(85-111) 145/100 (08/26 0643) SpO2:  [96 %-100 %] 100 % (08/26 0503) Weight:  [107.4 kg-110 kg] 107.4 kg (08/26 0509) Last BM Date : 05/10/23  Physical Exam General:   Alert and oriented, pleasant Head:  Normocephalic and atraumatic. Eyes:  +scleral icterus Abdomen:  Bowel sounds hyperactive, distended, bruising improved to left abdominal wall and flank  Msk:  Symmetrical without gross deformities. Normal posture. Pulses:  Normal pulses noted. Extremities:  Without clubbing or edema. Neurologic:  Alert and  oriented x4; negative asterixis Skin:  Warm and dry, intact without significant lesions.  Cervical Nodes:  No significant cervical adenopathy. Psych:  Alert and cooperative. Normal mood and affect.  Intake/Output from previous day: 08/25 0701 - 08/26 0700 In: -  Out: 500 [Urine:500] Intake/Output this shift: No intake/output data recorded.  Lab Results  Recent Labs    05/09/23 0501 05/11/23 0557  WBC 6.2 7.4  HGB 10.4* 10.8*  HCT 31.8* 33.5*  PLT 228 249   BMET Recent Labs    05/09/23 0501 05/10/23 0612 05/11/23 0557  NA 135 133* 134*  K 3.0* 4.0 3.7  CL 102 103 102  CO2 24 23 22   GLUCOSE 105* 99 89  BUN 11 11 12   CREATININE 1.01 0.97 1.01  CALCIUM 9.3 9.3 9.4   LFT Recent Labs    05/09/23 0501 05/10/23 0612 05/11/23 0557  PROT 7.5 7.5 7.5  ALBUMIN 3.7 3.6 3.5  AST 104* 103* 92*  ALT 46* 51* 53*  ALKPHOS 104 104 95  BILITOT 5.4* 4.5* 4.4*   PT/INR Recent Labs     05/10/23 0612 05/11/23 0557  LABPROT 19.0* 18.9*  INR 1.6* 1.6*    Studies/Results DG Abd 1 View  Result Date: 05/08/2023 CLINICAL DATA:  Ileus. EXAM: ABDOMEN - 1 VIEW COMPARISON:  May 04, 2023. FINDINGS: Stable mildly dilated and air-filled large and small bowel loops are noted. No abnormal calcifications are noted. IMPRESSION: Stable bowel dilatation is noted above most consistent with ileus. Electronically Signed   By: Lupita Raider M.D.   On: 05/08/2023 15:03   CT ABDOMEN PELVIS W CONTRAST  Result Date: 05/05/2023 CLINICAL DATA:  Abdominal pain and distention EXAM: CT ABDOMEN AND PELVIS WITH CONTRAST TECHNIQUE: Multidetector CT imaging of the abdomen and pelvis was performed using the standard protocol following bolus administration of intravenous contrast. RADIATION DOSE REDUCTION: This exam was performed according to the departmental dose-optimization program which includes automated exposure control, adjustment of the mA and/or kV according to patient size and/or use of iterative reconstruction technique. CONTRAST:  OMNIPAQUE IOHEXOL 300 MG/ML  SOLN COMPARISON:  Abdominal CT 05/01/2023. CT abdomen and pelvis 04/28/2023. FINDINGS: Lower chest: No acute abnormality. Hepatobiliary: Diffusely heterogeneous and hypoattenuating as seen on the prior study compatible with fatty infiltration and likely cirrhosis. Gallbladder and bile ducts are within normal limits. Pancreas: Unremarkable. No pancreatic ductal dilatation or surrounding inflammatory changes. Spleen: Normal in size without focal abnormality. Adrenals/Urinary Tract: Left  renal cysts are present measuring up to 3.2 cm. There is no hydronephrosis. The adrenal glands bladder are within normal limits. Stomach/Bowel: Colon is diffusely distended with air-fluid levels. No transition point. No focal inflammation. The appendix is not seen. Small bowel and stomach are within normal limits. Vascular/Lymphatic: Aortic atherosclerosis. No  enlarged abdominal or pelvic lymph nodes. Reproductive: Prostate is unremarkable. Other: There is trace ascites. There is diffuse abdominal wall edema. Musculoskeletal: No acute or significant osseous findings. IMPRESSION: 1. Colon is diffusely distended with air-fluid levels. No transition point. Findings are compatible with ileus. 2. Cirrhotic and fatty infiltration of the liver. 3. Trace ascites. 4. Body wall edema. 5. Left Bosniak I benign renal cyst measuring 3.2 cm. No follow-up imaging is recommended. JACR 2018 Feb; 264-273, Management of the Incidental Renal Mass on CT, [employer] 2021; 814-848, Bosniak Classification of Cystic Renal Masses, Version 2019. Aortic Atherosclerosis (ICD10-I70.0). Electronically Signed   By: Darliss Cheney M.D.   On: 05/05/2023 18:57   US Venous Img Lower Bilateral (DVT)  Result Date: 05/05/2023 CLINICAL DATA:  Bilateral lower extremity pain and edema for the past 2 weeks. History of smoking. Evaluate for DVT. EXAM: BILATERAL LOWER EXTREMITY VENOUS DOPPLER ULTRASOUND TECHNIQUE: Gray-scale sonography with graded compression, as well as color Doppler and duplex ultrasound were performed to evaluate the lower extremity deep venous systems from the level of the common femoral vein and including the common femoral, femoral, profunda femoral, popliteal and calf veins including the posterior tibial, peroneal and gastrocnemius veins when visible. The superficial great saphenous vein was also interrogated. Spectral Doppler was utilized to evaluate flow at rest and with distal augmentation maneuvers in the common femoral, femoral and popliteal veins. COMPARISON:  None Available. FINDINGS: RIGHT LOWER EXTREMITY Common Femoral Vein: No evidence of thrombus. Normal compressibility, respiratory phasicity and response to augmentation. Saphenofemoral Junction: No evidence of thrombus. Normal compressibility and flow on color Doppler imaging. Profunda Femoral Vein: No evidence of thrombus.  Normal compressibility and flow on color Doppler imaging. Femoral Vein: No evidence of thrombus. Normal compressibility, respiratory phasicity and response to augmentation. Popliteal Vein: No evidence of thrombus. Normal compressibility, respiratory phasicity and response to augmentation. Rouleaux flow is noted within the left popliteal vein. Calf Veins: No evidence of thrombus. Normal compressibility and flow on color Doppler imaging. Superficial Great Saphenous Vein: No evidence of thrombus. Normal compressibility. Other Findings:  None. LEFT LOWER EXTREMITY Common Femoral Vein: No evidence of thrombus. Normal compressibility, respiratory phasicity and response to augmentation. Saphenofemoral Junction: No evidence of thrombus. Normal compressibility and flow on color Doppler imaging. Profunda Femoral Vein: No evidence of thrombus. Normal compressibility and flow on color Doppler imaging. Femoral Vein: No evidence of thrombus. Normal compressibility, respiratory phasicity and response to augmentation. Popliteal Vein: No evidence of thrombus. Normal compressibility, respiratory phasicity and response to augmentation. Calf Veins: No evidence of thrombus. Normal compressibility and flow on color Doppler imaging. Superficial Great Saphenous Vein: No evidence of thrombus. Normal compressibility. Other Findings:  None. IMPRESSION: No evidence of DVT within either lower extremity. Electronically Signed   By: Simonne Come M.D.   On: 05/05/2023 16:40   DG Abd 2 Views  Result Date: 05/04/2023 CLINICAL DATA:  Abdominal distension EXAM: ABDOMEN - 2 VIEW COMPARISON:  05/01/2023, 04/29/2023 FINDINGS: Progressive gaseous distension of both large and small bowel loops throughout the abdomen. No gross free intraperitoneal air. IMPRESSION: Progressive gaseous distension of both large and small bowel loops throughout the abdomen. Pattern favors ileus. Obstruction not excluded. Electronically Signed  By: Duanne Guess D.O.    On: 05/04/2023 18:54   [place] ASCITES (ABDOMEN LIMITED)  Result Date: 05/04/2023 CLINICAL DATA:  Abdominal ascites. EXAM: LIMITED ABDOMEN ULTRASOUND FOR ASCITES TECHNIQUE: Limited ultrasound survey for ascites was performed in all four abdominal quadrants. COMPARISON:  CT of the abdomen 05/01/2023 FINDINGS: Small volume ascites is present in the right lower quadrant and at the midline. No significant fluid pockets are present to allow for paracentesis. IMPRESSION: Stable appearance of small volume ascites. Electronically Signed   By: Marin Roberts M.D.   On: 05/04/2023 12:41   CT ABDOMEN WO CONTRAST  Result Date: 05/01/2023 CLINICAL DATA:  Inpatient. Extensive left flank bruising. Generalized abdominal pain. No known injury. EXAM: CT ABDOMEN WITHOUT CONTRAST TECHNIQUE: Multidetector CT imaging of the abdomen was performed following the standard protocol without IV contrast. RADIATION DOSE REDUCTION: This exam was performed according to the departmental dose-optimization program which includes automated exposure control, adjustment of the mA and/or kV according to patient size and/or use of iterative reconstruction technique. COMPARISON:  04/28/2023 CT abdomen/pelvis. FINDINGS: Lower chest: No significant pulmonary nodules or acute consolidative airspace disease. Coronary atherosclerosis. Hepatobiliary: Severe diffuse hepatic steatosis. Relative hypertrophy of caudate lobe with lobulated liver contour, cannot exclude cirrhosis. No discrete liver mass on this noncontrast exam. Somewhat contracted gallbladder with minimal layering hyperdense material, favoring vicarious excretion of contrast from recent IV contrast CT study, without definite gallbladder wall thickening. No biliary ductal dilatation. Pancreas: Normal, with no mass or duct dilation. Spleen: Normal size. No mass. Adrenals/Urinary Tract: Normal adrenals. No renal stones. No hydronephrosis. Simple 3.4 cm anterior interpolar left renal cyst, for  which no follow-up imaging is recommended. Stomach/Bowel: Normal non-distended stomach. Visualized small and large bowel is normal caliber, with no bowel wall thickening. Vascular/Lymphatic: Atherosclerotic nonaneurysmal abdominal aorta. Mild paraumbilical and ventral right abdominal varices. No pathologically enlarged lymph nodes in the abdomen. Other: Small volume perihepatic ascites. No focal fluid collection. No pneumoperitoneum. Mild-to-moderate anasarca is worsened. No evidence of retroperitoneal hematoma. Musculoskeletal: No aggressive appearing focal osseous lesions. Mild thoracolumbar spondylosis. IMPRESSION: 1. No evidence of retroperitoneal hematoma. 2. Severe diffuse hepatic steatosis. Relative hypertrophy of the caudate lobe with lobulated liver contour, cannot exclude cirrhosis. No discrete liver mass on this noncontrast exam. 3. Small volume perihepatic ascites. Mild paraumbilical and ventral right abdominal varices. Mild-to-moderate anasarca is worsened. 4. Coronary atherosclerosis. 5.  Aortic Atherosclerosis (ICD10-I70.0). Electronically Signed   By: Delbert Phenix M.D.   On: 05/01/2023 19:23   US ABDOMEN LIMITED WITH LIVER DOPPLER  Result Date: 04/30/2023 CLINICAL DATA:  52 year old male with history of cirrhosis. EXAM: DUPLEX ULTRASOUND OF LIVER TECHNIQUE: Color and duplex Doppler ultrasound was performed to evaluate the hepatic in-flow and out-flow vessels. COMPARISON:  CT abdomen pelvis from 04/28/2023, 02/01/2013 FINDINGS: Liver: Diffusely increased echogenicity. Glisson's capsule is invisible amongst the portal veins. Mildly nodular contour. No focal lesion, mass or intrahepatic biliary ductal dilatation. Main Portal Vein size: 0.7 cm Portal Vein Velocities Main Prox:  51 cm/sec, hepatofugal Main Mid: 36 cm/sec, hepatofugal Main Dist:  38 cm/sec, hepatofugal Right: Not visualized. Left: 24 cm/sec, hepatofugal Hepatic Vein Velocities Right:  53 cm/sec, antegrade Middle:  36 cm/sec, antegrade  Left:  32 cm/sec, antegrade IVC: Not visualized. Hepatic Artery Velocity:  173 cm/sec Splenic Vein Velocity:  28 cm/sec Spleen: 12.7 cm x 12.8 cm x 6.2 cm with a total volume of 529 cm^3 (411 cm^3 is upper limit normal) Portal Vein Occlusion/Thrombus: No Splenic Vein Occlusion/Thrombus: No Ascites: Trace Varices:  None IMPRESSION: 1. Diffuse severe echogenicity throughout the hepatic parenchyma as could be seen with severe hepatic steatosis or in the acute setting of hepatitis. 2. Morphologic changes of cirrhosis with portal hypertension is evidence by mild splenomegaly, trace ascites, and hepatofugal flow the visualized portal system which is widely patent. Marliss Coots, MD [facility] Radiology Electronically Signed   By: Marliss Coots M.D.   On: 04/30/2023 15:35   DG Abd 1 View  Result Date: 04/29/2023 CLINICAL DATA:  Abdominal distension. EXAM: ABDOMEN - 1 VIEW COMPARISON:  CT abdomen/pelvis 04/27/2021 FINDINGS: Mildly prominent gaseous distension of colon with nonobstructive bowel gas pattern. No abnormal calcifications. Degenerative changes of the spine. IMPRESSION: Nonobstructive bowel gas pattern. Mildly prominent gaseous distension of colon. Electronically Signed   By: Feliberto Harts M.D.   On: 04/29/2023 16:58   [place] ASCITES (ABDOMEN LIMITED)  Result Date: 04/29/2023 CLINICAL DATA:  Ascites Interventional radiology consulted for paracentesis EXAM: LIMITED ABDOMEN ULTRASOUND FOR ASCITES TECHNIQUE: Limited ultrasound survey for ascites was performed in all four abdominal quadrants. COMPARISON:  CT abdomen pelvis 04/28/2023 FINDINGS: Sonographic evaluation of the abdomen demonstrates trace perihepatic ascites which is insufficient for aspiration. IMPRESSION: Trace perihepatic ascites, insufficient for aspiration. No paracentesis was performed. Electronically Signed   By: Acquanetta Belling M.D.   On: 04/29/2023 09:56   CT ABDOMEN PELVIS W CONTRAST  Result  Date: 04/28/2023 CLINICAL DATA:  Bowel obstruction suspected. Acute, nonlocalized abdominal pain. EXAM: CT ABDOMEN AND PELVIS WITH CONTRAST TECHNIQUE: Multidetector CT imaging of the abdomen and pelvis was performed using the standard protocol following bolus administration of intravenous contrast. RADIATION DOSE REDUCTION: This exam was performed according to the departmental dose-optimization program which includes automated exposure control, adjustment of the mA and/or kV according to patient size and/or use of iterative reconstruction technique. CONTRAST:  OMNIPAQUE IOHEXOL 300 MG/ML  SOLN COMPARISON:  09/24/2021 FINDINGS: Lower chest:  Coronary atherosclerosis. Hepatobiliary: Severe hepatic steatosis. Lobulated liver surface with newly enlarged caudate and further widened fissures.No evidence of biliary obstruction or stone. Pancreas: Unremarkable. Spleen: Unremarkable. Adrenals/Urinary Tract: Negative adrenals. No hydronephrosis or stone. Left renal hilar and cortical cysts with simple appearance measuring up to 3.2 cm at the renal hilum. Unremarkable bladder. Stomach/Bowel: No obstruction. No appendicitis other visible bowel inflammation. Vascular/Lymphatic: No acute vascular abnormality. Scattered atheromatous calcification. No mass or adenopathy. Reproductive:No pathologic findings. Other: New small volume ascites distributed throughout the abdomen including at the small umbilical hernia. Musculoskeletal: No acute abnormalities. Generalized degeneration with mild scoliosis. IMPRESSION: 1. Severe hepatic steatosis with progressive findings of cirrhosis since 2023. Small volume ascites is newly seen. 2. Extensive atherosclerosis. Electronically Signed   By: Tiburcio Pea M.D.   On: 04/28/2023 05:30    Assessment  52 y.o. male with a history of Wolff-Parkinson-White syndrome s/p ablation, CAD, chronic pain, diastolic heart failure, GERD, HTN, adenomatous colon polyps, gastric ulcers, ulcerative  esophagitis, alcohol abuse who presented to the hospital with findings of decompensated cirrhosis and abdominal pain.   Decompensated cirrhosis: in setting of alcohol use. MELD 3.0 today is 19. [place] X 2 this admission without sufficient ascites present. Continues with abdominal distension in setting of ileus but improving. Can consider starting low dose diuretics tomorrow if electrolytes remain stable. Discussed with Jolyn Lent, Chaplain. Will request palliative care consultation to aid in goals of care.   Ileus: continue with Linzess, Miralax, and lactulose. Slowly improving.    Alcoholic hepatitis: initially DF was less than 32 but during admission increased.  Prednisolone started on 8/21. Today is day 6 of prednisolone. Will need to calculate Lille score tomorrow. DF improving and 31.5 today today with PT control of 13.   Plan / Recommendations  Continue prednisolone daily. Lille score tomorrow Consider resuming low dose diuretics tomorrow morning after review of labs Continue current bowel regimen Continue PT, ambulation, up in chair Will need outpatient colonoscopy/EGD Palliative consultation requested to help with goals of care Absolute ETOH cessation    LOS: 13 days    05/11/2023, 9:35 AM  Gelene Mink, PhD, ANP-BC [facility] Gastroenterology

## 2023-05-11 NOTE — TOC Progression Note (Addendum)
Transition of Care [facility]) - Progression Note    Patient Details  Name: Timothy [name] MRN: [id number] Date of Birth: [birth year]  [facility] CM/SW Contact  Villa Herb, [place] Phone Number: 05/11/2023, 3:26 PM  Clinical Narrative:    CSW updated by MD that should be medically ready tmrw and to get auth restarted. Pt has not been seen by PT since 8/22 so will need an updated therapy note. CSW updated PT of this. TOC to follow.   PT to see pt today, auth to be restarted.   Addendum 4:30- CSW spoke with pt at bedside and spouse on the phone to review bed offers. Pt accepts bed at [facility] at this time. Insurance auth restarted. TOC to follow.   Expected Discharge Plan: Skilled Nursing Facility Barriers to Discharge: Continued Medical Work up  Expected Discharge Plan and Services In-house Referral: Clinical Social Work     Living arrangements for the past 2 months: Single Family Home                                       Social Determinants of Health (SDOH) Interventions SDOH Screenings   Food Insecurity: No Food Insecurity (05/03/2023)  Housing: Low Risk  (05/03/2023)  Transportation Needs: No Transportation Needs (05/03/2023)  Utilities: Not At Risk (05/03/2023)  Tobacco Use: High Risk (05/03/2023)    Readmission Risk Interventions     No data to display

## 2023-05-11 NOTE — Progress Notes (Signed)
MD Adefeso made aware of patient diastolic bp being elevated.

## 2023-05-12 ENCOUNTER — Inpatient Hospital Stay (HOSPITAL_COMMUNITY): Payer: 59

## 2023-05-12 ENCOUNTER — Encounter (HOSPITAL_COMMUNITY): Payer: Self-pay | Admitting: Family Medicine

## 2023-05-12 ENCOUNTER — Telehealth: Payer: Self-pay | Admitting: Gastroenterology

## 2023-05-12 DIAGNOSIS — R5381 Other malaise: Secondary | ICD-10-CM | POA: Diagnosis not present

## 2023-05-12 DIAGNOSIS — E871 Hypo-osmolality and hyponatremia: Secondary | ICD-10-CM | POA: Diagnosis not present

## 2023-05-12 DIAGNOSIS — K2101 Gastro-esophageal reflux disease with esophagitis, with bleeding: Secondary | ICD-10-CM | POA: Diagnosis not present

## 2023-05-12 DIAGNOSIS — I10 Essential (primary) hypertension: Secondary | ICD-10-CM | POA: Diagnosis not present

## 2023-05-12 DIAGNOSIS — D6959 Other secondary thrombocytopenia: Secondary | ICD-10-CM | POA: Diagnosis not present

## 2023-05-12 DIAGNOSIS — D6489 Other specified anemias: Secondary | ICD-10-CM | POA: Diagnosis not present

## 2023-05-12 DIAGNOSIS — I5032 Chronic diastolic (congestive) heart failure: Secondary | ICD-10-CM | POA: Diagnosis not present

## 2023-05-12 DIAGNOSIS — K766 Portal hypertension: Secondary | ICD-10-CM | POA: Diagnosis not present

## 2023-05-12 DIAGNOSIS — D649 Anemia, unspecified: Secondary | ICD-10-CM | POA: Diagnosis not present

## 2023-05-12 DIAGNOSIS — E785 Hyperlipidemia, unspecified: Secondary | ICD-10-CM | POA: Diagnosis not present

## 2023-05-12 DIAGNOSIS — K5903 Drug induced constipation: Secondary | ICD-10-CM | POA: Diagnosis not present

## 2023-05-12 DIAGNOSIS — M6281 Muscle weakness (generalized): Secondary | ICD-10-CM | POA: Diagnosis not present

## 2023-05-12 DIAGNOSIS — Z7189 Other specified counseling: Secondary | ICD-10-CM

## 2023-05-12 DIAGNOSIS — R1084 Generalized abdominal pain: Secondary | ICD-10-CM | POA: Diagnosis not present

## 2023-05-12 DIAGNOSIS — K567 Ileus, unspecified: Secondary | ICD-10-CM | POA: Diagnosis not present

## 2023-05-12 DIAGNOSIS — E119 Type 2 diabetes mellitus without complications: Secondary | ICD-10-CM | POA: Diagnosis not present

## 2023-05-12 DIAGNOSIS — Z515 Encounter for palliative care: Secondary | ICD-10-CM

## 2023-05-12 DIAGNOSIS — R945 Abnormal results of liver function studies: Secondary | ICD-10-CM | POA: Diagnosis not present

## 2023-05-12 DIAGNOSIS — Z8679 Personal history of other diseases of the circulatory system: Secondary | ICD-10-CM | POA: Diagnosis not present

## 2023-05-12 DIAGNOSIS — Z741 Need for assistance with personal care: Secondary | ICD-10-CM | POA: Diagnosis not present

## 2023-05-12 DIAGNOSIS — K2270 Barrett's esophagus without dysplasia: Secondary | ICD-10-CM | POA: Diagnosis not present

## 2023-05-12 DIAGNOSIS — I9589 Other hypotension: Secondary | ICD-10-CM | POA: Diagnosis not present

## 2023-05-12 DIAGNOSIS — K7031 Alcoholic cirrhosis of liver with ascites: Secondary | ICD-10-CM | POA: Diagnosis not present

## 2023-05-12 DIAGNOSIS — E876 Hypokalemia: Secondary | ICD-10-CM | POA: Diagnosis not present

## 2023-05-12 LAB — COMPREHENSIVE METABOLIC PANEL
ALT: 50 U/L — ABNORMAL HIGH (ref 0–44)
AST: 78 U/L — ABNORMAL HIGH (ref 15–41)
Albumin: 3 g/dL — ABNORMAL LOW (ref 3.5–5.0)
Alkaline Phosphatase: 85 U/L (ref 38–126)
Anion gap: 7 (ref 5–15)
BUN: 12 mg/dL (ref 6–20)
CO2: 22 mmol/L (ref 22–32)
Calcium: 9.1 mg/dL (ref 8.9–10.3)
Chloride: 104 mmol/L (ref 98–111)
Creatinine, Ser: 0.93 mg/dL (ref 0.61–1.24)
GFR, Estimated: >60 mL/min (ref >60)
Glucose, Bld: 75 mg/dL (ref 70–99)
Potassium: 3.9 mmol/L (ref 3.5–5.1)
Sodium: 133 mmol/L — ABNORMAL LOW (ref 135–145)
Total Bilirubin: 3.3 mg/dL — ABNORMAL HIGH (ref 0.3–1.2)
Total Protein: 6.4 g/dL — ABNORMAL LOW (ref 6.5–8.1)

## 2023-05-12 LAB — PHOSPHORUS: Phosphorus: 3.9 mg/dL (ref 2.5–4.6)

## 2023-05-12 LAB — PROTIME-INR
INR: 1.6 — ABNORMAL HIGH (ref 0.8–1.2)
Prothrombin Time: 18.9 seconds — ABNORMAL HIGH (ref 11.4–15.2)

## 2023-05-12 LAB — MAGNESIUM: Magnesium: 2 mg/dL (ref 1.7–2.4)

## 2023-05-12 LAB — GLUCOSE, CAPILLARY: Glucose-Capillary: 117 mg/dL — ABNORMAL HIGH (ref 70–99)

## 2023-05-12 MED ORDER — TRAZODONE HCL 100 MG PO TABS: 100.0000 mg | ORAL_TABLET | Freq: Every day | ORAL | Status: AC

## 2023-05-12 MED ORDER — SPIRONOLACTONE 25 MG PO TABS: 50.0000 mg | ORAL_TABLET | Freq: Every day | ORAL | Status: DC

## 2023-05-12 MED ORDER — OXYCODONE HCL 15 MG PO TABS: 15.0000 mg | ORAL_TABLET | Freq: Four times a day (QID) | ORAL | 0 refills | Status: DC | PRN

## 2023-05-12 MED ORDER — CARVEDILOL 6.25 MG PO TABS: 6.2500 mg | ORAL_TABLET | Freq: Two times a day (BID) | ORAL | Status: DC

## 2023-05-12 MED ORDER — FUROSEMIDE 20 MG PO TABS: 20.0000 mg | ORAL_TABLET | Freq: Every day | ORAL | Status: AC

## 2023-05-12 MED ORDER — MAGNESIUM OXIDE -MG SUPPLEMENT 400 (240 MG) MG PO TABS: 400.0000 mg | ORAL_TABLET | Freq: Every day | ORAL | Status: DC

## 2023-05-12 MED ORDER — FUROSEMIDE 20 MG PO TABS: 20.0000 mg | ORAL_TABLET | Freq: Every day | ORAL | Status: DC

## 2023-05-12 MED ORDER — POTASSIUM CHLORIDE CRYS ER 20 MEQ PO TBCR: 40.0000 meq | EXTENDED_RELEASE_TABLET | Freq: Every day | ORAL | Status: AC

## 2023-05-12 MED ORDER — FOLIC ACID 1 MG PO TABS: 1.0000 mg | ORAL_TABLET | Freq: Every day | ORAL | Status: DC

## 2023-05-12 MED ORDER — LINACLOTIDE 290 MCG PO CAPS: 290.0000 ug | ORAL_CAPSULE | Freq: Every day | ORAL | Status: DC

## 2023-05-12 MED ORDER — POTASSIUM CHLORIDE CRYS ER 20 MEQ PO TBCR
20.0000 meq | EXTENDED_RELEASE_TABLET | Freq: Every day | ORAL | Status: DC
  Administered 2023-05-12: 20 meq via ORAL
  Filled 2023-05-12: qty 1

## 2023-05-12 MED ORDER — SPIRONOLACTONE 50 MG PO TABS: 50.0000 mg | ORAL_TABLET | Freq: Every day | ORAL | Status: AC

## 2023-05-12 MED ORDER — PREDNISOLONE 5 MG PO TABS: ORAL_TABLET | ORAL | Status: DC

## 2023-05-12 MED ORDER — LACTULOSE 10 GM/15ML PO SOLN: 30.0000 g | Freq: Two times a day (BID) | ORAL | Status: AC

## 2023-05-12 MED ORDER — GABAPENTIN 100 MG PO CAPS: 200.0000 mg | ORAL_CAPSULE | Freq: Three times a day (TID) | ORAL | Status: AC

## 2023-05-12 NOTE — Telephone Encounter (Signed)
Patient is being discharged from the hospital today to [facility].   Mandy: Please arrange hospital follow-up with Dr. Jena Gauss or available app in 1-2 weeks.   Courtney: We have requested for repeat BMP to be completed Thursday at the facility. Can you reach out sometime tomorrow to ensure they have the orders for this?

## 2023-05-12 NOTE — Progress Notes (Signed)
IV removed and report called to Jae Dire at [facility].  Transported by [employer].

## 2023-05-12 NOTE — Discharge Summary (Addendum)
Physician Discharge Summary   Patient: Timothy [name] MRN: [id number] DOB: 1970/11/27  Admit date:     04/28/2023  Discharge date: 05/12/23  Discharge Physician: Onalee Hua Onaje Warne   PCP: Elfredia Nevins, MD   Recommendations at discharge:   Please follow up with primary care provider within 1-2 weeks  Please repeat BMP and mag on 05/14/23 or 05/15/23 Adjust potassium supplementation to keep K >3.9 and Mag > 2.0 Prednisolone 40 mg daily for a total of 21 more days (starting 05/13/23); then start taper of 10 mg per week.   So 30 mg daily (starting 06/03/23) for 1 week, 20 mg daily(starting 06/10/23) for 1 week, 10 mg daily (starting 06/17/23) for 1 week, then stop.     Hospital Course: [name] is a 52 year old male with history of HTN, Barrett esophagus, PUD, WPW status post ablation, NICM, alcohol abuse, and liver disease who presents with worsening abdominal distention and pain. 08/13: to ED. Mult electrolyte derangement, low Plt. CT of the abdomen pelvis with IV contrast showed presence of new cirrhotic changes with small volume of ascites. Admitted w/ decompensated cirrhosis/ascites. GI consult. Plan paracentesis. Plan start furosemide/spironolactone when electrolytes are improved 08/14: Ultrasound today reveals trace perihepatic ascites insufficient for tap. Subsequent plain films revealed nonspecific bowel gas pattern with some increase in colonic air. No evidence of obstruction, impaction or free air. Plan liver doppler.  08/15: Liver doppler done. Add spironolactone 100mg  daily/continue lasix 40mg  daily. More aggressive bowel regimen given no signs of obstruction and narcotic use. Will start Linzess daily. Can titrate accordingly.  08/16: Liver Doppler studies with severe steatosis, portal hypertension and patent portal vein. Start lactulose 10 g twice daily. Holding diuretics. Repeated CT to eval L flank bruise and drop Hgb, no hematoma 08/17: still off lasix/spiro, increase lactulose  to 20 bid. Stable to transfer out of stepdown. Follow GI recs - consider repeat US / attempt paracentesis?  08/18: BP and K improved, restart lasix/spiro today. Of note, pt reported concern for when he was down for his MRI, he was moved roughly by staff and this maybe caused the bruising,   05/04/2023: Improving, low sodium, potassium..  Sodium potassium repleted 05/05/2023: Reporting of increased abdominal pain, distention, feeling weak, constipated, 05/06/2023: Complain of generalized weaknesses, generalized aches and pain, still having abdominal distention 05/07/2023: Lethargic, complaining of abdominal pain, hemodynamically stable, much improved labs -pending insurance authorization and approval for discharge to SNF 05/08/23--pt sitting up in chair.  Had 3 loose BM last 24 hours.  Complains abd pain.  Abd distended.  KUB ordered 05/09/23--pt in better spirits.  Had 4 BMs.  KUB shows stable bowel dilatation.  No vomiting 05/10/23--ambulated hall with walker 50 feet.  3 BMs last 24 hours.  Abd distended but shows some improvement 05/11/23--ambulated hall 50 feet.  3 BMs.  Abdomen less distended.  Tolerating diet Paitent is in good spirits.  Had 4 BMs without blood.  Abdomen distension slowly improving.  Tolerating diet       ASSESSMENT & PLAN:  Decompensated Alcoholic Liver Cirrhosis  / Generalized Abdominal pain Progressive alcoholic liver cirrhosis and severe steatosis resulting in portal HTN, ascites Hyponatremia d/t liver disease,  Thrombocytopenia d/t liver disease   -Repeat ultrasound, and CT scan did not reveal enough fluid for paracentesis) -MELD score of 25, Maddrey DF up to 38-prednisolone started on 05/06/23 -Liver Doppler without evidence of PVT.   No signs of SBP.  GI following -appreciated discussed with Dr. Tasia Catchings Coreg 6.25 mg bid  Lasix  40 mg po bid>>hold 05/09/23 until K improved Spiro at 50 mg po daily >>hold 05/09/23 until K improved Lactulose 30g bid  2g sodium diet   Absolute EtOH cessation  Needs outpatient colonoscopy for surveillance purposes. Can consider EGD at the same time to evaluate for varices as well as to screen for Barrett's esophagus.  05/05/23 CT scan of abdomenDiffuse colonic distention with air-fluid, possible ileus, liver cirrhosis and fatty infiltration, trace ascites, body wall edema, benign renal cyst 3.2 cm:  restart lasix/spiro 8/27 Recheck potassium and magnesium levels on 8/29 or 8/30 and adjust KCl and mag ox supplementation to keep K > 4.0 and mag > 2.0  Ileus Complicated by opiate dependence,  Lactulose as above  Linzess 290 mcg daily  8/23 KUB--stable bowel dilatation Optimize electrolytes Ambulate at least once daily Continues to improve Now having 3-4 BMs daily with less abdominal distension.  Tolerating diet  Anemia stable no evidence of active GI bleeding.  Previous EGD with ulcerative esophagitis.  Continue to monitor hemoglobin no plans for inpatient endoscopic evaluation  Continue PPI twice daily.   Bilateral flank bruising No retroperitoneal hematoma on CT 8/20 Monitor on physical exam Monitor Hgb--overall stable   Hypomagnesemia /Hypokalemia/hyponatremia/hypophosphatemia Replace as needed Much improved  Monitor BMP, Mg, phos discontinue sodium supplements--contributing to his fluid retention   Barrett's esophagus without dysplasia GERD Continue PPI twice daily.   Alcohol Abuser/History of DTs Monitoring closely, following CIWA protocol continue daily thiamine, folate replacement  Absolute EtOH cessation    Opioid dependence - Chronic Pain  Opioid induced chronic constipation  Pt reports being on opioids since 1990s and has withdrawal symptoms if not receiving resume his home oxycodone 15 mg Q6h prn, may consider increase dose as wean off IV meds, plan taper this as well outpatient  PDMP reviewed  Oxycodone 15 mg, #120, last refill 03/31/23   Chronic HFmrEF Last echo 12/19/2020:  LVEF 45-50%,  mildly decreased LV function moderate LVH, grade 1 diastolic dysfunction impaired relaxation Monitor I's and O's Limit IV fluid resuscitation Repeat echo  History of Wolff-Parkinson-White syndrome Status post ablation, monitoring  H/o hypertension -hypotensive Was persistently hypotensive, medications were held, BP is improving Now tolerating coreg 6.25 mg bid   Dyslipidemia Holding statins due to elevated LFTs due to alcohol use abuse liver cirrhosis     Consultants:  Gastroenterology   Procedures: None    DVT prophylaxis: SCD d/t low Plt Pertinent IV fluids/nutrition: no continuous IV fluids  Central lines / invasive devices: none  Code Status: FULL CODE ACP documentation reviewed: 05/04/23 none on file   Current Admission Status: inpatient TOC needs / Dispo plan: SNF rehab when medically stable   Barriers to discharge / significant pending items:  Not medically stable          Consultants: GI, palliative Procedures performed: none  Disposition: Skilled nursing facility Diet recommendation:  2 gram sodium DISCHARGE MEDICATION: Allergies as of 05/12/2023       Reactions   Morphine Other (See Comments)   Makes overly sleepy        Medication List     STOP taking these medications    chlordiazePOXIDE 5 MG capsule Commonly known as: LIBRIUM   cloNIDine 0.2 MG tablet Commonly known as: CATAPRES   cloNIDine 0.3 mg/24hr patch Commonly known as: CATAPRES - Dosed in mg/24 hr       TAKE these medications    albuterol 108 (90 Base) MCG/ACT inhaler Commonly known as: VENTOLIN HFA Inhale 2  puffs into the lungs every 6 (six) hours as needed for wheezing or shortness of breath.   albuterol (2.5 MG/3ML) 0.083% nebulizer solution Commonly known as: PROVENTIL Take 2.5 mg by nebulization every 4 (four) hours as needed.   carvedilol 6.25 MG tablet Commonly known as: COREG Take 1 tablet (6.25 mg total) by mouth 2 (two) times daily with a  meal. What changed:  medication strength how much to take   folic acid 1 MG tablet Commonly known as: FOLVITE Take 1 tablet (1 mg total) by mouth daily. What changed: Another medication with the same name was added. Make sure you understand how and when to take each.   folic acid 1 MG tablet Commonly known as: FOLVITE Take 1 tablet (1 mg total) by mouth daily. Start taking on: May 13, 2023 What changed: You were already taking a medication with the same name, and this prescription was added. Make sure you understand how and when to take each.   furosemide 20 MG tablet Commonly known as: LASIX Take 1 tablet (20 mg total) by mouth daily. Start taking on: May 13, 2023 What changed:  medication strength how much to take   gabapentin 100 MG capsule Commonly known as: NEURONTIN Take 2 capsules (200 mg total) by mouth 3 (three) times daily.   hydrOXYzine 25 MG tablet Commonly known as: ATARAX Take 1 tablet (25 mg total) by mouth 3 (three) times daily as needed for anxiety or nausea.   lactulose 10 GM/15ML solution Commonly known as: CHRONULAC Take 45 mLs (30 g total) by mouth 2 (two) times daily.   linaclotide 290 MCG Caps capsule Commonly known as: LINZESS Take 1 capsule (290 mcg total) by mouth daily before breakfast. Start taking on: May 13, 2023   magnesium oxide 400 (240 Mg) MG tablet Commonly known as: MAG-OX Take 1 tablet (400 mg total) by mouth daily.   multivitamin with minerals Tabs tablet Take 1 tablet by mouth daily.   nicotine 21 mg/24hr patch Commonly known as: NICODERM CQ - dosed in mg/24 hours Place 1 patch (21 mg total) onto the skin daily.   nystatin cream Commonly known as: MYCOSTATIN Apply 1 Application topically daily as needed for dry skin.   oxyCODONE 15 MG immediate release tablet Commonly known as: ROXICODONE Take 1 tablet (15 mg total) by mouth every 6 (six) hours as needed for pain.   pantoprazole 40 MG tablet Commonly known  as: PROTONIX Take 40 mg by mouth 2 (two) times daily.   potassium chloride SA 20 MEQ tablet Commonly known as: KLOR-CON M Take 2 tablets (40 mEq total) by mouth daily. Start taking on: May 13, 2023   prednisoLONE 5 MG Tabs tablet 40 mg daily for a total of 21 more days (starting 05/13/23); Then 30 mg daily (starting 06/03/23) for 1 week, 20 mg daily(starting 06/10/23) for 1 week, 10 mg daily (starting 06/17/23) for 1 week, then stop. Start taking on: May 13, 2023   spironolactone 50 MG tablet Commonly known as: ALDACTONE Take 1 tablet (50 mg total) by mouth daily. Start taking on: May 13, 2023   sucralfate 1 GM/10ML suspension Commonly known as: CARAFATE Take 1 g by mouth daily.   thiamine 100 MG tablet Commonly known as: Vitamin B-1 Take 1 tablet (100 mg total) by mouth daily.   traZODone 100 MG tablet Commonly known as: DESYREL Take 1 tablet (100 mg total) by mouth at bedtime.   Vitamin D3 25 MCG (1000 UT) Caps Take 2 capsules  by mouth every evening.         Contact information for after-discharge care     Destination     [facility] .   Service: Skilled Nursing Contact information: [address] [place] [address] [phone]                    Discharge Exam: Ceasar Mons Weights   05/11/23 0500 05/11/23 0509 05/12/23 0500  Weight: 110 kg 107.4 kg 106.4 kg   HEENT:  /AT, No thrush, no icterus CV:  RRR, no rub, no S3, no S4 Lung:  CTA, no wheeze, no rhonchi Abd:  soft/+BS, mild lower abd Ext:  trace LE edema, no lymphangitis, no synovitis, no rash   Condition at discharge: stable  The results of significant diagnostics from this hospitalization (including imaging, microbiology, ancillary and laboratory) are listed below for reference.   Imaging Studies: DG Abd 1 View  Result Date: 05/08/2023 CLINICAL DATA:  Ileus. EXAM: ABDOMEN - 1 VIEW COMPARISON:  May 04, 2023. FINDINGS: Stable mildly  dilated and air-filled large and small bowel loops are noted. No abnormal calcifications are noted. IMPRESSION: Stable bowel dilatation is noted above most consistent with ileus. Electronically Signed   By: Lupita Raider M.D.   On: 05/08/2023 15:03   CT ABDOMEN PELVIS W CONTRAST  Result Date: 05/05/2023 CLINICAL DATA:  Abdominal pain and distention EXAM: CT ABDOMEN AND PELVIS WITH CONTRAST TECHNIQUE: Multidetector CT imaging of the abdomen and pelvis was performed using the standard protocol following bolus administration of intravenous contrast. RADIATION DOSE REDUCTION: This exam was performed according to the departmental dose-optimization program which includes automated exposure control, adjustment of the mA and/or kV according to patient size and/or use of iterative reconstruction technique. CONTRAST:  OMNIPAQUE IOHEXOL 300 MG/ML  SOLN COMPARISON:  Abdominal CT 05/01/2023. CT abdomen and pelvis 04/28/2023. FINDINGS: Lower chest: No acute abnormality. Hepatobiliary: Diffusely heterogeneous and hypoattenuating as seen on the prior study compatible with fatty infiltration and likely cirrhosis. Gallbladder and bile ducts are within normal limits. Pancreas: Unremarkable. No pancreatic ductal dilatation or surrounding inflammatory changes. Spleen: Normal in size without focal abnormality. Adrenals/Urinary Tract: Left renal cysts are present measuring up to 3.2 cm. There is no hydronephrosis. The adrenal glands bladder are within normal limits. Stomach/Bowel: Colon is diffusely distended with air-fluid levels. No transition point. No focal inflammation. The appendix is not seen. Small bowel and stomach are within normal limits. Vascular/Lymphatic: Aortic atherosclerosis. No enlarged abdominal or pelvic lymph nodes. Reproductive: Prostate is unremarkable. Other: There is trace ascites. There is diffuse abdominal wall edema. Musculoskeletal: No acute or significant osseous findings. IMPRESSION: 1. Colon is  diffusely distended with air-fluid levels. No transition point. Findings are compatible with ileus. 2. Cirrhotic and fatty infiltration of the liver. 3. Trace ascites. 4. Body wall edema. 5. Left Bosniak I benign renal cyst measuring 3.2 cm. No follow-up imaging is recommended. JACR 2018 Feb; 264-273, Management of the Incidental Renal Mass on CT, [employer] 2021; [id number], Bosniak Classification of Cystic Renal Masses, Version 2019. Aortic Atherosclerosis (ICD10-I70.0). Electronically Signed   By: Darliss Cheney M.D.   On: 05/05/2023 18:57   US Venous Img Lower Bilateral (DVT)  Result Date: 05/05/2023 CLINICAL DATA:  Bilateral lower extremity pain and edema for the past 2 weeks. History of smoking. Evaluate for DVT. EXAM: BILATERAL LOWER EXTREMITY VENOUS DOPPLER ULTRASOUND TECHNIQUE: Gray-scale sonography with graded compression, as well as color Doppler and duplex ultrasound were performed to evaluate  the lower extremity deep venous systems from the level of the common femoral vein and including the common femoral, femoral, profunda femoral, popliteal and calf veins including the posterior tibial, peroneal and gastrocnemius veins when visible. The superficial great saphenous vein was also interrogated. Spectral Doppler was utilized to evaluate flow at rest and with distal augmentation maneuvers in the common femoral, femoral and popliteal veins. COMPARISON:  None Available. FINDINGS: RIGHT LOWER EXTREMITY Common Femoral Vein: No evidence of thrombus. Normal compressibility, respiratory phasicity and response to augmentation. Saphenofemoral Junction: No evidence of thrombus. Normal compressibility and flow on color Doppler imaging. Profunda Femoral Vein: No evidence of thrombus. Normal compressibility and flow on color Doppler imaging. Femoral Vein: No evidence of thrombus. Normal compressibility, respiratory phasicity and response to augmentation. Popliteal Vein: No evidence of thrombus. Normal  compressibility, respiratory phasicity and response to augmentation. Rouleaux flow is noted within the left popliteal vein. Calf Veins: No evidence of thrombus. Normal compressibility and flow on color Doppler imaging. Superficial Great Saphenous Vein: No evidence of thrombus. Normal compressibility. Other Findings:  None. LEFT LOWER EXTREMITY Common Femoral Vein: No evidence of thrombus. Normal compressibility, respiratory phasicity and response to augmentation. Saphenofemoral Junction: No evidence of thrombus. Normal compressibility and flow on color Doppler imaging. Profunda Femoral Vein: No evidence of thrombus. Normal compressibility and flow on color Doppler imaging. Femoral Vein: No evidence of thrombus. Normal compressibility, respiratory phasicity and response to augmentation. Popliteal Vein: No evidence of thrombus. Normal compressibility, respiratory phasicity and response to augmentation. Calf Veins: No evidence of thrombus. Normal compressibility and flow on color Doppler imaging. Superficial Great Saphenous Vein: No evidence of thrombus. Normal compressibility. Other Findings:  None. IMPRESSION: No evidence of DVT within either lower extremity. Electronically Signed   By: Simonne Come M.D.   On: 05/05/2023 16:40   DG Abd 2 Views  Result Date: 05/04/2023 CLINICAL DATA:  Abdominal distension EXAM: ABDOMEN - 2 VIEW COMPARISON:  05/01/2023, 04/29/2023 FINDINGS: Progressive gaseous distension of both large and small bowel loops throughout the abdomen. No gross free intraperitoneal air. IMPRESSION: Progressive gaseous distension of both large and small bowel loops throughout the abdomen. Pattern favors ileus. Obstruction not excluded. Electronically Signed   By: Duanne Guess D.O.   On: 05/04/2023 18:54   [place] ASCITES (ABDOMEN LIMITED)  Result Date: 05/04/2023 CLINICAL DATA:  Abdominal ascites. EXAM: LIMITED ABDOMEN ULTRASOUND FOR ASCITES TECHNIQUE: Limited ultrasound survey for ascites was performed  in all four abdominal quadrants. COMPARISON:  CT of the abdomen 05/01/2023 FINDINGS: Small volume ascites is present in the right lower quadrant and at the midline. No significant fluid pockets are present to allow for paracentesis. IMPRESSION: Stable appearance of small volume ascites. Electronically Signed   By: Marin Roberts M.D.   On: 05/04/2023 12:41   CT ABDOMEN WO CONTRAST  Result Date: 05/01/2023 CLINICAL DATA:  Inpatient. Extensive left flank bruising. Generalized abdominal pain. No known injury. EXAM: CT ABDOMEN WITHOUT CONTRAST TECHNIQUE: Multidetector CT imaging of the abdomen was performed following the standard protocol without IV contrast. RADIATION DOSE REDUCTION: This exam was performed according to the departmental dose-optimization program which includes automated exposure control, adjustment of the mA and/or kV according to patient size and/or use of iterative reconstruction technique. COMPARISON:  04/28/2023 CT abdomen/pelvis. FINDINGS: Lower chest: No significant pulmonary nodules or acute consolidative airspace disease. Coronary atherosclerosis. Hepatobiliary: Severe diffuse hepatic steatosis. Relative hypertrophy of caudate lobe with lobulated liver contour, cannot exclude cirrhosis. No discrete liver mass on this noncontrast exam. Somewhat contracted  gallbladder with minimal layering hyperdense material, favoring vicarious excretion of contrast from recent IV contrast CT study, without definite gallbladder wall thickening. No biliary ductal dilatation. Pancreas: Normal, with no mass or duct dilation. Spleen: Normal size. No mass. Adrenals/Urinary Tract: Normal adrenals. No renal stones. No hydronephrosis. Simple 3.4 cm anterior interpolar left renal cyst, for which no follow-up imaging is recommended. Stomach/Bowel: Normal non-distended stomach. Visualized small and large bowel is normal caliber, with no bowel wall thickening. Vascular/Lymphatic: Atherosclerotic nonaneurysmal  abdominal aorta. Mild paraumbilical and ventral right abdominal varices. No pathologically enlarged lymph nodes in the abdomen. Other: Small volume perihepatic ascites. No focal fluid collection. No pneumoperitoneum. Mild-to-moderate anasarca is worsened. No evidence of retroperitoneal hematoma. Musculoskeletal: No aggressive appearing focal osseous lesions. Mild thoracolumbar spondylosis. IMPRESSION: 1. No evidence of retroperitoneal hematoma. 2. Severe diffuse hepatic steatosis. Relative hypertrophy of the caudate lobe with lobulated liver contour, cannot exclude cirrhosis. No discrete liver mass on this noncontrast exam. 3. Small volume perihepatic ascites. Mild paraumbilical and ventral right abdominal varices. Mild-to-moderate anasarca is worsened. 4. Coronary atherosclerosis. 5.  Aortic Atherosclerosis (ICD10-I70.0). Electronically Signed   By: Delbert Phenix M.D.   On: 05/01/2023 19:23   US ABDOMEN LIMITED WITH LIVER DOPPLER  Result Date: 04/30/2023 CLINICAL DATA:  52 year old male with history of cirrhosis. EXAM: DUPLEX ULTRASOUND OF LIVER TECHNIQUE: Color and duplex Doppler ultrasound was performed to evaluate the hepatic in-flow and out-flow vessels. COMPARISON:  CT abdomen pelvis from 04/28/2023, 02/01/2013 FINDINGS: Liver: Diffusely increased echogenicity. Glisson's capsule is invisible amongst the portal veins. Mildly nodular contour. No focal lesion, mass or intrahepatic biliary ductal dilatation. Main Portal Vein size: 0.7 cm Portal Vein Velocities Main Prox:  51 cm/sec, hepatofugal Main Mid: 36 cm/sec, hepatofugal Main Dist:  38 cm/sec, hepatofugal Right: Not visualized. Left: 24 cm/sec, hepatofugal Hepatic Vein Velocities Right:  53 cm/sec, antegrade Middle:  36 cm/sec, antegrade Left:  32 cm/sec, antegrade IVC: Not visualized. Hepatic Artery Velocity:  173 cm/sec Splenic Vein Velocity:  28 cm/sec Spleen: 12.7 cm x 12.8 cm x 6.2 cm with a total volume of 529 cm^3 (411 cm^3 is upper limit normal)  Portal Vein Occlusion/Thrombus: No Splenic Vein Occlusion/Thrombus: No Ascites: Trace Varices: None IMPRESSION: 1. Diffuse severe echogenicity throughout the hepatic parenchyma as could be seen with severe hepatic steatosis or in the acute setting of hepatitis. 2. Morphologic changes of cirrhosis with portal hypertension is evidence by mild splenomegaly, trace ascites, and hepatofugal flow the visualized portal system which is widely patent. Marliss Coots, MD [facility] Electronically Signed   By: Marliss Coots M.D.   On: 04/30/2023 15:35   DG Abd 1 View  Result Date: 04/29/2023 CLINICAL DATA:  Abdominal distension. EXAM: ABDOMEN - 1 VIEW COMPARISON:  CT abdomen/pelvis 04/27/2021 FINDINGS: Mildly prominent gaseous distension of colon with nonobstructive bowel gas pattern. No abnormal calcifications. Degenerative changes of the spine. IMPRESSION: Nonobstructive bowel gas pattern. Mildly prominent gaseous distension of colon. Electronically Signed   By: Feliberto Harts M.D.   On: 04/29/2023 16:58   [place] ASCITES (ABDOMEN LIMITED)  Result Date: 04/29/2023 CLINICAL DATA:  Ascites Interventional radiology consulted for paracentesis EXAM: LIMITED ABDOMEN ULTRASOUND FOR ASCITES TECHNIQUE: Limited ultrasound survey for ascites was performed in all four abdominal quadrants. COMPARISON:  CT abdomen pelvis 04/28/2023 FINDINGS: Sonographic evaluation of the abdomen demonstrates trace perihepatic ascites which is insufficient for aspiration. IMPRESSION: Trace perihepatic ascites, insufficient for aspiration. No paracentesis was performed. Electronically Signed   By: Mauri Reading  Mir  M.D.   On: 04/29/2023 09:56   CT ABDOMEN PELVIS W CONTRAST  Result Date: 04/28/2023 CLINICAL DATA:  Bowel obstruction suspected. Acute, nonlocalized abdominal pain. EXAM: CT ABDOMEN AND PELVIS WITH CONTRAST TECHNIQUE: Multidetector CT imaging of the abdomen and pelvis was performed  using the standard protocol following bolus administration of intravenous contrast. RADIATION DOSE REDUCTION: This exam was performed according to the departmental dose-optimization program which includes automated exposure control, adjustment of the mA and/or kV according to patient size and/or use of iterative reconstruction technique. CONTRAST:  OMNIPAQUE IOHEXOL 300 MG/ML  SOLN COMPARISON:  09/24/2021 FINDINGS: Lower chest:  Coronary atherosclerosis. Hepatobiliary: Severe hepatic steatosis. Lobulated liver surface with newly enlarged caudate and further widened fissures.No evidence of biliary obstruction or stone. Pancreas: Unremarkable. Spleen: Unremarkable. Adrenals/Urinary Tract: Negative adrenals. No hydronephrosis or stone. Left renal hilar and cortical cysts with simple appearance measuring up to 3.2 cm at the renal hilum. Unremarkable bladder. Stomach/Bowel: No obstruction. No appendicitis other visible bowel inflammation. Vascular/Lymphatic: No acute vascular abnormality. Scattered atheromatous calcification. No mass or adenopathy. Reproductive:No pathologic findings. Other: New small volume ascites distributed throughout the abdomen including at the small umbilical hernia. Musculoskeletal: No acute abnormalities. Generalized degeneration with mild scoliosis. IMPRESSION: 1. Severe hepatic steatosis with progressive findings of cirrhosis since 2023. Small volume ascites is newly seen. 2. Extensive atherosclerosis. Electronically Signed   By: Tiburcio Pea M.D.   On: 04/28/2023 05:30    Microbiology: Results for orders placed or performed during the hospital encounter of 04/28/23  MRSA Next Gen by PCR, Nasal     Status: None   Collection Time: 04/29/23 12:25 AM   Specimen: Nasal Mucosa; Nasal Swab  Result Value Ref Range Status   MRSA by PCR Next Gen NOT DETECTED NOT DETECTED Final    Comment: (NOTE) The GeneXpert MRSA Assay (FDA approved for NASAL specimens only), is one component of a  comprehensive MRSA colonization surveillance program. It is not intended to diagnose MRSA infection nor to guide or monitor treatment for MRSA infections. Test performance is not FDA approved in patients less than 62 years old. Performed at [facility], [address] [place], [place] [address]     Labs: CBC: Recent Labs  Lab 05/09/23 0501 05/11/23 0557  WBC 6.2 7.4  HGB 10.4* 10.8*  HCT 31.8* 33.5*  MCV 101.9* 101.8*  PLT 228 249   Basic Metabolic Panel: Recent Labs  Lab 05/06/23 0443 05/07/23 0427 05/08/23 0438 05/09/23 0501 05/10/23 0612 05/11/23 0557 05/12/23 0406  NA 133*   < > 132* 135 133* 134* 133*  K 3.1*   < > 2.9* 3.0* 4.0 3.7 3.9  CL 102   < > 101 102 103 102 104  CO2 21*   < > 21* 24 23 22 22   GLUCOSE 89   < > 111* 105* 99 89 75  BUN 8   < > 11 11 11 12 12   CREATININE 0.85   < > 1.04 1.01 0.97 1.01 0.93  CALCIUM 9.2   < > 9.4 9.3 9.3 9.4 9.1  MG 1.8  --   --  2.1 2.0 2.1 2.0  PHOS  --   --   --  2.0* 2.6 3.5 3.9   < > = values in this interval not displayed.   Liver Function Tests: Recent Labs  Lab 05/08/23 0438 05/09/23 0501 05/10/23 0612 05/11/23 0557 05/12/23 0406  AST 107* 104* 103* 92* 78*  ALT 41 46* 51* 53* 50*  ALKPHOS 106  104 104 95 85  BILITOT 5.5* 5.4* 4.5* 4.4* 3.3*  PROT 7.5 7.5 7.5 7.5 6.4*  ALBUMIN 4.0 3.7 3.6 3.5 3.0*   CBG: Recent Labs  Lab 05/09/23 0716 05/10/23 0725 05/11/23 0542 05/11/23 0725 05/12/23 0720  GLUCAP 83 98 83 89 117*    Discharge time spent: greater than 30 minutes.  Signed: Catarina Hartshorn, MD [facility] 05/12/2023

## 2023-05-12 NOTE — Consult Note (Addendum)
Consultation Note Date: 05/12/2023   Patient Name: Timothy [name]  DOB: 05-Aug-1971  MRN: [id number]  Age / Sex: 52 y.o., male  PCP: Timothy Nevins, MD Referring Physician: Catarina Hartshorn, MD  Reason for Consultation: Establishing goals of care  HPI/Patient Profile: 52 y.o. male  with past medical history of liver disease, alcohol abuse, Barrett's esophagus, PUD, WPW status post ablation x 3, HTN, NICM admitted on 04/28/2023 with decompensated liver cirrhosis.   Clinical Assessment and Goals of Care: I have reviewed medical records including EPIC notes, labs and imaging, received report from RN, assessed the patient.  Timothy [name], is sitting up in bed.  He appears acutely/chronically ill.  He greets me, making but not keeping eye contact.  He is alert and oriented x 3, able to make his needs known.  His wife of 18 years, Timothy [name], is present at bedside, along with his mother, father, and 17-year-old son.  Timothy [name] asks his parents and Timothy to leave so we may have a private discussions  We meet at the bedside to discuss diagnosis prognosis, GOC, EOL wishes, disposition and options.  I introduced Palliative Medicine as specialized medical care for people living with serious illness. It focuses on providing relief from the symptoms and stress of a serious illness. The goal is to improve quality of life for both the patient and the family.  We discussed a brief life review of the patient.  Timothy [name] and Timothy [name] have been married for 18 years.  He has 2 children from a previous marriage and 1 Timothy with Timothy [name].    We then focused on their current illness.  We talk about Timothy [name]'s liver disease as a chronic and progressive illness that will 1-Day likely lead to his end-of-life.  The natural disease trajectory and expectations at EOL were discussed.  Timothy [name] shares his concern about his pain medications, will he get them at rehab.   I reassure him that he should receive his regular pain medicines while at rehab, finding a balance with his liver disease.  We talked about short-term rehab for strength.  The ultimate goal is for Timothy [name] to return to his home.  He asks about assistance with having a ramp.  I encouraged him to work with Timothy [name] at rehab and also local churches.  Advanced directives, concepts specific to code status, artifical feeding and hydration, and rehospitalization were considered and discussed.  We talked about the concept of "treat the treatable, but allow the natural passing.  [name] has minimal discussions regarding this.  I encouraged him and his wife to consider what he does and does not want, especially as things start to look different.  Hospice and Palliative Care services outpatient were explained and offered.  We talk in detail about the difference between hospice and palliative care.  Initially, Timothy [name] asks about going for "inpatient palliative".  I shared that only hospice home has inpatient services and people go there to let nature take its course.  Wife, Timothy [name], shares that her grandmother  died at [facility].  [name] is agreeable to outpatient palliative services.  Provider choice offered.  He and wife choose Timothy [name].  Discussed the importance of continued conversation with family and the medical providers regarding overall plan of care and treatment options, ensuring decisions are within the context of the patient's values and GOCs.  Questions and concerns were addressed.  The patient was encouraged to call with questions or concerns.  PMT will continue to support holistically.  Conference with attending, bedside nursing staff, transition of care team related to patient condition, needs, goals of care, disposition.   HCPOA  NEXT OF KIN -wife of 18 years, Timothy [name].  Both of Timothy [name]'s parents are still alive.    SUMMARY OF RECOMMENDATIONS   At this point continue to treat the  treatable Short-term rehab with the ultimate goal of returning home Outpatient palliative services, provider of choice Timothy [name]    Code Status/Advance Care Planning: Full code - We talked about the concept of "treat the treatable, but allow the natural passing.  Mr. Hasenstab has minimal discussions regarding this.  I encouraged him and his wife to consider what he does and does not want, especially as things start to look different.  Symptom Management:  Per hospitalist, no additional needs at this time.  Palliative Prophylaxis:  Frequent Pain Assessment and Oral Care  Additional Recommendations (Limitations, Scope, Preferences): Full Scope Treatment  Psycho-social/Spiritual:  Desire for further Chaplaincy support:no Additional Recommendations: Caregiving  Support/Resources  Prognosis:  Unable to determine, less than 1 year would not be surprising.  Discharge Planning: Short-term rehab at [facility] with ultimate goal of returning home, outpatient palliative services with Timothy [name]      Primary Diagnoses: Present on Admission:  Hypokalemia  Abdominal pain  Alcoholic cirrhosis (HCC)  Alcohol withdrawal/DTs  Anxiety state  Barrett's esophagus without dysplasia  Dyslipidemia  Elevated LFTs  Essential hypertension  ETOH abuse  GERD (gastroesophageal reflux disease)  Hypomagnesemia   I have reviewed the medical record, interviewed the patient and family, and examined the patient. The following aspects are pertinent.  Past Medical History:  Diagnosis Date   Anxiety    Arthritis    CAD (coronary artery disease) 2007   50% stenosis small diagonal   Central sleep apnea    Chronic pain    since 1998 has been on chronic pain medication   Fatty liver    GERD (gastroesophageal reflux disease)    Hemorrhoids    Hepatomegaly 10/03/2014   HTN (hypertension)    Palpitations    Syncope    Tachycardia    Tobacco abuse    Tubular adenoma    Wolff-Parkinson-White (WPW) syndrome     says was cured with ablation   Social History   Socioeconomic History   Marital status: Married    Spouse name: Not on file   Number of children: 2   Years of education: Not on file   Highest education level: Not on file  Occupational History   Occupation: unemployed    Employer: UNEMPLOYED  Tobacco Use   Smoking status: Every Day    Current packs/day: 0.50    Average packs/day: 0.5 packs/day for 25.0 years (12.5 ttl pk-yrs)    Types: Cigarettes   Smokeless tobacco: Never  Vaping Use   Vaping status: Never Used  Substance and Sexual Activity   Alcohol use: Yes    Comment: daily; 12/18/20-5 days/week, 6 drinks at time   Drug use: Yes    Types: Marijuana  Comment: hx marijuana use   Sexual activity: Yes    Birth control/protection: None  Other Topics Concern   Not on file  Social History Narrative   Not on file   Social Determinants of Health   Financial Resource Strain: Not on file  Food Insecurity: No Food Insecurity (05/03/2023)   Hunger Vital Sign    Worried About Running Out of Food in the Last Year: Never true    Ran Out of Food in the Last Year: Never true  Transportation Needs: No Transportation Needs (05/03/2023)   PRAPARE - Administrator, Civil Service (Medical): No    Lack of Transportation (Non-Medical): No  Physical Activity: Not on file  Stress: Not on file  Social Connections: Not on file   Family History  Problem Relation Age of Onset   Coronary artery disease Father        cabg   Colon cancer Father        age 61, chemo/surgery   Breast cancer Mother    Hypertension Mother    Scheduled Meds:  carvedilol  6.25 mg Oral BID WC   folic acid  1 mg Intravenous Daily   gabapentin  200 mg Oral TID   lactulose  30 g Oral BID   linaclotide  290 mcg Oral QAC breakfast   magnesium oxide  400 mg Oral BID   multivitamin with minerals  1 tablet Oral Daily   nicotine  21 mg Transdermal Daily   pantoprazole  40 mg Oral BID   phosphorus   500 mg Oral BID   polyethylene glycol  17 g Oral TID   prednisoLONE  40 mg Oral Daily   sodium chloride flush  3 mL Intravenous Q12H   sucralfate  1 g Oral BID   thiamine  100 mg Oral Daily   Or   thiamine  100 mg Intravenous Daily   traZODone  100 mg Oral QHS   Continuous Infusions:  sodium chloride 10 mL/hr at 05/08/23 1227   PRN Meds:.sodium chloride, ALPRAZolam, guaiFENesin-dextromethorphan, HYDROmorphone (DILAUDID) injection, hydrOXYzine, ipratropium, levalbuterol, liver oil-zinc oxide, ondansetron **OR** ondansetron (ZOFRAN) IV, mouth rinse, oxyCODONE, sodium phosphate, zolpidem Medications Prior to Admission:  Prior to Admission medications   Medication Sig Start Date End Date Taking? Authorizing Provider  albuterol (VENTOLIN HFA) 108 (90 Base) MCG/ACT inhaler Inhale 2 puffs into the lungs every 6 (six) hours as needed for wheezing or shortness of breath.   Yes [provider]  carvedilol (COREG) 3.125 MG tablet Take 1 tablet (3.125 mg total) by mouth 2 (two) times daily with a meal. 09/21/22  Yes Leroy Sea, MD  Cholecalciferol (VITAMIN D3) 25 MCG (1000 UT) CAPS Take 2 capsules by mouth every evening.   Yes [provider]  cloNIDine (CATAPRES - DOSED IN MG/24 HR) 0.3 mg/24hr patch Place 0.3 mg onto the skin once a week. 03/31/23  Yes [provider]  cloNIDine (CATAPRES) 0.2 MG tablet Take 0.2 mg by mouth daily. 04/02/23  Yes [provider]  folic acid (FOLVITE) 1 MG tablet Take 1 tablet (1 mg total) by mouth daily. 12/21/20  Yes Emokpae, Courage, MD  furosemide (LASIX) 40 MG tablet Take 1 tablet (40 mg total) by mouth daily. 09/21/22  Yes Leroy Sea, MD  hydrOXYzine (ATARAX/VISTARIL) 25 MG tablet Take 1 tablet (25 mg total) by mouth 3 (three) times daily as needed for anxiety or nausea. 12/20/20  Yes Shon Hale, MD  Multiple Vitamin (MULTIVITAMIN WITH MINERALS)  TABS tablet Take 1 tablet by mouth daily. 12/21/20  Yes Emokpae, Courage, MD   nicotine (NICODERM CQ - DOSED IN MG/24 HOURS) 21 mg/24hr patch Place 1 patch (21 mg total) onto the skin daily. 09/21/22  Yes Leroy Sea, MD  nystatin cream (MYCOSTATIN) Apply 1 Application topically daily as needed for dry skin. 09/10/21  Yes [provider]  oxyCODONE (ROXICODONE) 15 MG immediate release tablet Take 15 mg by mouth every 6 (six) hours as needed for pain. 03/12/16  Yes [provider]  pantoprazole (PROTONIX) 40 MG tablet Take 40 mg by mouth 2 (two) times daily. 11/06/22  Yes [provider]  sucralfate (CARAFATE) 1 GM/10ML suspension Take 1 g by mouth daily. 06/23/22  Yes [provider]  thiamine (VITAMIN B-1) 100 MG tablet Take 1 tablet (100 mg total) by mouth daily. 09/21/22  Yes Leroy Sea, MD  albuterol (PROVENTIL) (2.5 MG/3ML) 0.083% nebulizer solution Take 2.5 mg by nebulization every 4 (four) hours as needed. Patient not taking: Reported on 04/28/2023 07/17/21   [provider]  chlordiazePOXIDE (LIBRIUM) 5 MG capsule Take 1 pill twice a day for 2 days then 1 pill once a day for 2 days and stop 09/21/22   Leroy Sea, MD  dexlansoprazole (DEXILANT) 60 MG capsule Take 1 capsule (60 mg total) by mouth daily. 12/25/20 02/09/21  Rourk, Gerrit Friends, MD   Allergies  Allergen Reactions   Morphine Other (See Comments)    Makes overly sleepy   Review of Systems  Unable to perform ROS: Other    Physical Exam Vitals and nursing note reviewed.  Constitutional:      General: He is not in acute distress.    Appearance: He is ill-appearing.  Cardiovascular:     Rate and Rhythm: Normal rate.  Pulmonary:     Effort: Pulmonary effort is normal. No respiratory distress.  Abdominal:     General: Abdomen is protuberant.     Palpations: Abdomen is soft.  Skin:    General: Skin is warm and dry.     Coloration: Skin is jaundiced.  Neurological:     Mental Status: He is alert and oriented to person, place, and time.   Psychiatric:        Mood and Affect: Mood normal.        Behavior: Behavior normal.     Vital Signs: BP (!) 156/97   Pulse 75   Temp 98.2 F (36.8 C)   Resp 18   Ht 6' (1.829 m)   Wt 106.4 kg   SpO2 99%   BMI 31.81 kg/m  Pain Scale: 0-10 POSS *See Group Information*: S-Acceptable,Sleep, easy to arouse Pain Score: 0-No pain   SpO2: SpO2: 99 % O2 Device:SpO2: 99 % O2 Flow Rate: .O2 Flow Rate (L/min): 0 L/min  IO: Intake/output summary:  Intake/Output Summary (Last 24 hours) at 05/12/2023 0856 Last data filed at 05/12/2023 0600 Gross per 24 hour  Intake 600 ml  Output 2850 ml  Net -2250 ml    LBM: Last BM Date : 05/10/23 Baseline Weight: Weight: 111.1 kg Most recent weight: Weight: 106.4 kg     Palliative Assessment/Data:     Time In: 0840 Time Out: 0955 Time Total: 75 minutes Greater than 50%  of this time was spent counseling and coordinating care related to the above assessment and plan.  Signed by: Katheran Awe, NP   Please contact Palliative Medicine Team phone at [phone] for questions and concerns.  For individual provider: See Loretha Stapler

## 2023-05-12 NOTE — Plan of Care (Signed)

## 2023-05-12 NOTE — TOC Transition Note (Signed)
Transition of Care [facility]) - CM/SW Discharge Note   Patient Details  Name: Timothy [name] MRN: [id number] Date of Birth: 1971-03-13  [facility] CM/SW Contact:  Villa Herb, LCSWA Phone Number: 05/12/2023, 3:41 PM   Clinical Narrative:    CSW updated that pts insurance auth has been approved at this time. Revonda Standard with [facility] updated on D/C and they are ready to accept. CSW updated pts wife of this and plan for D/C. MD completed D/C and clinicals sent to facility. CSW updated RN of room and report numbers. RN will get rider waiver signed. [facility] called for transport. TOC signing off.   Final next level of care: Skilled Nursing Facility Barriers to Discharge: Barriers Resolved   Patient Goals and CMS Choice CMS Medicare.gov Compare Post Acute Care list provided to:: Patient Choice offered to / list presented to : Patient  Discharge Placement                  Patient to be transferred to facility by: [facility] Name of family member notified: Wife Patient and family notified of of transfer: 05/12/23  Discharge Plan and Services Additional resources added to the After Visit Summary for   In-house Referral: Clinical Social Work                                   Social Determinants of Health (SDOH) Interventions SDOH Screenings   Food Insecurity: No Food Insecurity (05/03/2023)  Housing: Low Risk  (05/03/2023)  Transportation Needs: No Transportation Needs (05/03/2023)  Utilities: Not At Risk (05/03/2023)  Tobacco Use: High Risk (05/12/2023)     Readmission Risk Interventions     No data to display

## 2023-05-12 NOTE — Progress Notes (Signed)
   05/11/23 1239  Spiritual Encounters  Type of Visit Initial  Care provided to: Patient  Conversation partners present during encounter Nurse  Referral source Patient request  Reason for visit Urgent spiritual support  OnCall Visit No  Spiritual Framework  Presenting Themes Meaning/purpose/sources of inspiration;Impactful experiences and emotions;Goals in life/care;Values and beliefs;Significant life change;Courage hope and growth;Coping tools  Community/Connection Family;Friend(s);Significant other  Patient Stress Factors Health changes;Loss  Family Stress Factors None identified  Interventions  Spiritual Care Interventions Made Established relationship of care and support;Compassionate presence;Reflective listening;Normalization of emotions;Reconciliation with self/others;Narrative/life review;Explored values/beliefs/practices/strengths;Meaning making;Prayer  Intervention Outcomes  Outcomes Connection to spiritual care;Awareness of support;Reduced anxiety;Reduced fear;Autonomy/agency;Awareness around self/spiritual resourses  Spiritual Care Plan  Spiritual Care Issues Still Outstanding Chaplain will continue to follow   Found patient sitting upright in hospital recliner today. Chaplain introduced self and was welcomed warmly bedside. Engaged him in reflection around his illness, poor prognosis, relationships. He is very aware that his liver is slowly failing and had spiritual questions related to EOL. Chaplain provided space for reflection, prayer, and opportunity for reconciliation with self. Provided prayer and will continue to remain available in order to provide spiritual support and to assess for spiritual need.   Rev. Jolyn Lent, M.Div Chaplain

## 2023-05-12 NOTE — Progress Notes (Signed)
Per Dr. Arbutus Leas, patient is getting discharged to SNF today.    Reviewed patient's chart and spoke with patient briefly.  He is having bowel movements and actually refused to take lactulose this morning due to having numerous bowel movements.  Reports had 3 bowel movements so far today.  5 documented bowel movement yesterday.  Continues with upper abdominal pain and abdominal distention.  We discussed that this will take some time to resolve, but he is moving in the right direction as his bowels are finally moving.  Encouraged that he continue with his current bowel regimen to ensure that his bowels continue to move well.  We can consider pulling back on this after he has had several days of good, productive bowel movements, but would keep lactulose onboard with history of decompensated cirrhosis.  Regarding cirrhosis, he is stable.  MELD 3.0 was 19 today.  His electrolytes were stable as well.  Advised that we can try adding low-dose diuretics back with close monitoring of electrolytes.  Recommend Lasix 20 mg and spironolactone 50 mg once daily.  He would need repeat BMP in a couple of days.  This was discussed with Dr. Arbutus Leas.  Regarding alcoholic hepatitis, Lille score was 0.093 today.  DF 30.4 using 13 as PT control.  Will continue prednisolone 40 mg daily to complete a 28-day course.  This will be followed by a taper of 10 mg/week.  These recommendations were relayed to Dr. Arbutus Leas.  We will plan to follow-up with patient in the office in 1-2 weeks.   Ermalinda Memos, PA-C [facility]

## 2023-05-12 NOTE — Progress Notes (Signed)
   05/12/23 1239  Spiritual Encounters  Type of Visit Follow up  Care provided to: Family  Referral source IDT Rounds  Reason for visit Routine spiritual support  OnCall Visit No  Spiritual Framework  Presenting Themes Meaning/purpose/sources of inspiration;Impactful experiences and emotions;Courage hope and growth  Patient Stress Factors None identified  Family Stress Factors Major life changes;Loss  Interventions  Spiritual Care Interventions Made Compassionate presence;Reflective listening;Normalization of emotions;Established relationship of care and support;Meaning making;Bereavement/grief support  Spiritual Care Plan  Spiritual Care Issues Still Outstanding Chaplain will continue to follow   Met with patient parents today in family room. Patient mother, Fannie Knee, shared that the [facility] NP was meeting with Mr. Mcbroom at the time and she stated she needed support. Chaplain provided space for both parents to reflect on their sons' illness and the limited long term prognosis that he has. They understand that he may not get better from his liver disease. Mother Fannie Knee is very tearful and they both are utilizing their spiritual resources for coping well at this time. Chaplain provided normalization of grief and fears and provided prayer today. Will continue to remain available in order to provide spiritual support and to assess for spiritual need.   Rev. Jolyn Lent, M.Div. Chaplain

## 2023-05-13 NOTE — Telephone Encounter (Signed)
Spoke to nurse Verlon Au at [facility], she informed me that pt is to have labs done tomorrow.

## 2023-05-13 NOTE — Telephone Encounter (Signed)
Great.  Thanks

## 2023-05-14 DIAGNOSIS — I10 Essential (primary) hypertension: Secondary | ICD-10-CM | POA: Diagnosis not present

## 2023-05-14 DIAGNOSIS — D649 Anemia, unspecified: Secondary | ICD-10-CM | POA: Diagnosis not present

## 2023-05-15 DIAGNOSIS — E119 Type 2 diabetes mellitus without complications: Secondary | ICD-10-CM | POA: Diagnosis not present

## 2023-05-18 DIAGNOSIS — R5381 Other malaise: Secondary | ICD-10-CM | POA: Diagnosis not present

## 2023-05-26 DIAGNOSIS — K766 Portal hypertension: Secondary | ICD-10-CM | POA: Diagnosis not present

## 2023-05-26 DIAGNOSIS — E876 Hypokalemia: Secondary | ICD-10-CM | POA: Diagnosis not present

## 2023-05-26 DIAGNOSIS — K567 Ileus, unspecified: Secondary | ICD-10-CM | POA: Diagnosis not present

## 2023-05-26 DIAGNOSIS — K219 Gastro-esophageal reflux disease without esophagitis: Secondary | ICD-10-CM | POA: Diagnosis not present

## 2023-05-26 DIAGNOSIS — I456 Pre-excitation syndrome: Secondary | ICD-10-CM | POA: Diagnosis not present

## 2023-05-26 DIAGNOSIS — E871 Hypo-osmolality and hyponatremia: Secondary | ICD-10-CM | POA: Diagnosis not present

## 2023-05-26 DIAGNOSIS — R5381 Other malaise: Secondary | ICD-10-CM | POA: Diagnosis not present

## 2023-05-26 DIAGNOSIS — I700 Atherosclerosis of aorta: Secondary | ICD-10-CM | POA: Diagnosis not present

## 2023-05-26 DIAGNOSIS — K5903 Drug induced constipation: Secondary | ICD-10-CM | POA: Diagnosis not present

## 2023-05-26 DIAGNOSIS — I959 Hypotension, unspecified: Secondary | ICD-10-CM | POA: Diagnosis not present

## 2023-05-26 DIAGNOSIS — K2270 Barrett's esophagus without dysplasia: Secondary | ICD-10-CM | POA: Diagnosis not present

## 2023-05-26 DIAGNOSIS — D6959 Other secondary thrombocytopenia: Secondary | ICD-10-CM | POA: Diagnosis not present

## 2023-05-26 DIAGNOSIS — G8929 Other chronic pain: Secondary | ICD-10-CM | POA: Diagnosis not present

## 2023-05-26 DIAGNOSIS — I110 Hypertensive heart disease with heart failure: Secondary | ICD-10-CM | POA: Diagnosis not present

## 2023-05-26 DIAGNOSIS — I5022 Chronic systolic (congestive) heart failure: Secondary | ICD-10-CM | POA: Diagnosis not present

## 2023-05-26 DIAGNOSIS — D649 Anemia, unspecified: Secondary | ICD-10-CM | POA: Diagnosis not present

## 2023-05-26 DIAGNOSIS — T402X5D Adverse effect of other opioids, subsequent encounter: Secondary | ICD-10-CM | POA: Diagnosis not present

## 2023-05-26 DIAGNOSIS — E785 Hyperlipidemia, unspecified: Secondary | ICD-10-CM | POA: Diagnosis not present

## 2023-05-27 DIAGNOSIS — K567 Ileus, unspecified: Secondary | ICD-10-CM | POA: Diagnosis not present

## 2023-05-27 DIAGNOSIS — I110 Hypertensive heart disease with heart failure: Secondary | ICD-10-CM | POA: Diagnosis not present

## 2023-05-27 DIAGNOSIS — I959 Hypotension, unspecified: Secondary | ICD-10-CM | POA: Diagnosis not present

## 2023-05-27 DIAGNOSIS — K219 Gastro-esophageal reflux disease without esophagitis: Secondary | ICD-10-CM | POA: Diagnosis not present

## 2023-05-27 DIAGNOSIS — R5381 Other malaise: Secondary | ICD-10-CM | POA: Diagnosis not present

## 2023-05-27 DIAGNOSIS — K2270 Barrett's esophagus without dysplasia: Secondary | ICD-10-CM | POA: Diagnosis not present

## 2023-05-27 DIAGNOSIS — K766 Portal hypertension: Secondary | ICD-10-CM | POA: Diagnosis not present

## 2023-05-27 DIAGNOSIS — E876 Hypokalemia: Secondary | ICD-10-CM | POA: Diagnosis not present

## 2023-05-27 DIAGNOSIS — G8929 Other chronic pain: Secondary | ICD-10-CM | POA: Diagnosis not present

## 2023-05-27 DIAGNOSIS — T402X5D Adverse effect of other opioids, subsequent encounter: Secondary | ICD-10-CM | POA: Diagnosis not present

## 2023-05-27 DIAGNOSIS — K5903 Drug induced constipation: Secondary | ICD-10-CM | POA: Diagnosis not present

## 2023-05-27 DIAGNOSIS — D649 Anemia, unspecified: Secondary | ICD-10-CM | POA: Diagnosis not present

## 2023-05-27 DIAGNOSIS — I700 Atherosclerosis of aorta: Secondary | ICD-10-CM | POA: Diagnosis not present

## 2023-05-27 DIAGNOSIS — D6959 Other secondary thrombocytopenia: Secondary | ICD-10-CM | POA: Diagnosis not present

## 2023-05-27 DIAGNOSIS — E871 Hypo-osmolality and hyponatremia: Secondary | ICD-10-CM | POA: Diagnosis not present

## 2023-05-27 DIAGNOSIS — I456 Pre-excitation syndrome: Secondary | ICD-10-CM | POA: Diagnosis not present

## 2023-05-27 DIAGNOSIS — E785 Hyperlipidemia, unspecified: Secondary | ICD-10-CM | POA: Diagnosis not present

## 2023-05-27 DIAGNOSIS — I5022 Chronic systolic (congestive) heart failure: Secondary | ICD-10-CM | POA: Diagnosis not present

## 2023-06-02 DIAGNOSIS — K219 Gastro-esophageal reflux disease without esophagitis: Secondary | ICD-10-CM | POA: Diagnosis not present

## 2023-06-02 DIAGNOSIS — U071 COVID-19: Secondary | ICD-10-CM | POA: Diagnosis not present

## 2023-06-02 DIAGNOSIS — I10 Essential (primary) hypertension: Secondary | ICD-10-CM | POA: Diagnosis not present

## 2023-06-02 NOTE — Progress Notes (Deleted)
GI Office Note    Referring Provider: Elfredia Nevins, MD Primary Care Physician:  Elfredia Nevins, MD  Primary Gastroenterologist:  Chief Complaint   No chief complaint on file.   History of Present Illness   Timothy [name] is a 52 y.o. male presenting today    Recent admission for decompensated cirrhosis in setting of etoh use. MELD 3.0 19 at discharge. Abdominal distention in setting of ileus. No ascites to tap.  Prednisolone. EGD/colonoscopy as outpatient.  Anemia 10-11 rnage. No overt GI bleeding.  EGD in January 2024 for hematemesis with grade D reflux esophagitis with ulceration, recently bleeding gastritis biopsied. No esophageal varices. Recommend repeat EGD 8 weeks thereafter though he was lost to follow-up for this. 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   Medications   Current Outpatient Medications  Medication Sig Dispense Refill   albuterol (PROVENTIL) (2.5 MG/3ML) 0.083% nebulizer solution Take 2.5 mg by nebulization every 4 (four) hours as needed. (Patient not taking: Reported on 04/28/2023)     albuterol (VENTOLIN HFA) 108 (90 Base) MCG/ACT inhaler Inhale 2 puffs into the lungs every 6 (six) hours as needed for wheezing or shortness of breath.     carvedilol (COREG) 6.25 MG tablet Take 1 tablet (6.25 mg total) by mouth 2 (two) times daily with a meal.     Cholecalciferol (VITAMIN D3) 25 MCG (1000 UT) CAPS Take 2 capsules by mouth every evening.     folic acid (FOLVITE) 1 MG tablet Take 1 tablet (1 mg total) by mouth daily. 30 tablet 2   folic acid (FOLVITE) 1 MG tablet Take 1 tablet (1 mg total) by mouth daily.     furosemide (LASIX) 20 MG tablet Take 1 tablet (20 mg total) by mouth daily.     gabapentin (NEURONTIN) 100 MG capsule Take 2 capsules (200 mg total) by mouth 3 (three) times daily.     hydrOXYzine (ATARAX/VISTARIL) 25 MG tablet Take 1 tablet  (25 mg total) by mouth 3 (three) times daily as needed for anxiety or nausea. 60 tablet 2   lactulose (CHRONULAC) 10 GM/15ML solution Take 45 mLs (30 g total) by mouth 2 (two) times daily.     linaclotide (LINZESS) 290 MCG CAPS capsule Take 1 capsule (290 mcg total) by mouth daily before breakfast.     magnesium oxide (MAG-OX) 400 (240 Mg) MG tablet Take 1 tablet (400 mg total) by mouth daily.     Multiple Vitamin (MULTIVITAMIN WITH MINERALS) TABS tablet Take 1 tablet by mouth daily. 120 tablet 2   nicotine (NICODERM CQ - DOSED IN MG/24 HOURS) 21 mg/24hr patch Place 1 patch (21 mg total) onto the skin daily. 28 patch 0   nystatin cream (MYCOSTATIN) Apply 1 Application topically daily as needed for dry skin.     oxyCODONE (ROXICODONE) 15 MG immediate release tablet Take 1 tablet (15 mg total) by mouth every 6 (six) hours as needed for pain. 12 tablet 0   pantoprazole (PROTONIX) 40 MG tablet Take 40 mg by mouth 2 (two) times daily.     potassium chloride SA (KLOR-CON M) 20 MEQ tablet Take 2 tablets (40 mEq total) by mouth daily.     prednisoLONE 5 MG TABS tablet 40 mg daily for a total of 21 more days (starting 05/13/23); Then 30 mg daily (starting 06/03/23) for 1 week, 20 mg daily(starting 06/10/23) for 1 week, 10 mg daily (  starting 06/17/23) for 1 week, then stop.     spironolactone (ALDACTONE) 50 MG tablet Take 1 tablet (50 mg total) by mouth daily.     sucralfate (CARAFATE) 1 GM/10ML suspension Take 1 g by mouth daily.     thiamine (VITAMIN B-1) 100 MG tablet Take 1 tablet (100 mg total) by mouth daily. 30 tablet 0   traZODone (DESYREL) 100 MG tablet Take 1 tablet (100 mg total) by mouth at bedtime.     No current facility-administered medications for this visit.    Allergies   Allergies as of 06/03/2023 - Review Complete 05/12/2023  Allergen Reaction Noted   Morphine Other (See Comments)      Past Medical History   Past Medical History:  Diagnosis Date   Anxiety    Arthritis    CAD  (coronary artery disease) 2007   50% stenosis small diagonal   Central sleep apnea    Chronic pain    since 1998 has been on chronic pain medication   Fatty liver    GERD (gastroesophageal reflux disease)    Hemorrhoids    Hepatomegaly 10/03/2014   HTN (hypertension)    Palpitations    Syncope    Tachycardia    Tobacco abuse    Tubular adenoma    Wolff-Parkinson-White (WPW) syndrome    says was cured with ablation    Past Surgical History   Past Surgical History:  Procedure Laterality Date   ADENOIDECTOMY     APPENDECTOMY     ATRIAL ABLATION SURGERY     AV NODE ABLATION     BIOPSY N/A 05/12/2013   Procedure: BIOPSY;  Surgeon: Corbin Ade, MD;  Location: [facility];  Service: Endoscopy;  Laterality: N/A;   BIOPSY  12/20/2020   Procedure: BIOPSY;  Surgeon: Corbin Ade, MD;  Location: [facility];  Service: Endoscopy;;   BIOPSY  09/18/2022   Procedure: BIOPSY;  Surgeon: Tressia Danas, MD;  Location: [facility] ENDOSCOPY;  Service: Gastroenterology;;   CARDIAC CATHETERIZATION N/A 05/08/2016   Procedure: Right/Left Heart Cath and Coronary Angiography;  Surgeon: Peter M Swaz[place]and, MD;  Location: [facility] INVASIVE CV LAB;  Service: Cardiovascular;  Laterality: N/A;   CARDIAC ELECTROPHYSIOLOGY STUDY AND ABLATION     SA node ablation   COLONOSCOPY     age 51   COLONOSCOPY WITH PROPOFOL N/A 05/12/2013   Dr. Jena Gauss- hemorrhoids, tubular adenoma   ESOPHAGEAL DILATION  12/20/2020   Procedure: ESOPHAGEAL DILATION;  Surgeon: Corbin Ade, MD;  Location: [facility];  Service: Endoscopy;;   ESOPHAGEAL DILATION N/A 12/20/2020   Procedure: ESOPHAGEAL DILATION;  Surgeon: Corbin Ade, MD;  Location: [facility];  Service: Endoscopy;  Laterality: N/A;   ESOPHAGOGASTRODUODENOSCOPY (EGD) WITH PROPOFOL N/A 05/12/2013   Dr. Jena Gauss- hiatal hernia, erosive reflux esophagitis   ESOPHAGOGASTRODUODENOSCOPY (EGD) WITH PROPOFOL N/A 12/20/2020   Procedure: ESOPHAGOGASTRODUODENOSCOPY (EGD) WITH PROPOFOL;  Surgeon:  Corbin Ade, MD;  Location: [facility];  Service: Endoscopy;  Laterality: N/A;   ESOPHAGOGASTRODUODENOSCOPY (EGD) WITH PROPOFOL N/A 09/18/2022   Procedure: ESOPHAGOGASTRODUODENOSCOPY (EGD) WITH PROPOFOL;  Surgeon: Tressia Danas, MD;  Location: [facility] ENDOSCOPY;  Service: Gastroenterology;  Laterality: N/A;   HAND SURGERY     HEMORRHOID BANDING  06/22/13   POLYPECTOMY N/A 05/12/2013   Procedure: POLYPECTOMY;  Surgeon: Corbin Ade, MD;  Location: [facility];  Service: Endoscopy;  Laterality: N/A;   SHOULDER SURGERY      Past Family History   Family History  Problem Relation  Age of Onset   Coronary artery disease Father        cabg   Colon cancer Father        age 30, chemo/surgery   Breast cancer Mother    Hypertension Mother     Past Social History   Social History   Socioeconomic History   Marital status: Married    Spouse name: Not on file   Number of children: 2   Years of education: Not on file   Highest education level: Not on file  Occupational History   Occupation: unemployed    Employer: UNEMPLOYED  Tobacco Use   Smoking status: Every Day    Current packs/day: 0.50    Average packs/day: 0.5 packs/day for 25.0 years (12.5 ttl pk-yrs)    Types: Cigarettes   Smokeless tobacco: Never  Vaping Use   Vaping status: Never Used  Substance and Sexual Activity   Alcohol use: Yes    Comment: daily; 12/18/20-5 days/week, 6 drinks at time   Drug use: Yes    Types: Marijuana    Comment: hx marijuana use   Sexual activity: Yes    Birth control/protection: None  Other Topics Concern   Not on file  Social History Narrative   Not on file   Social Determinants of Health   Financial Resource Strain: Not on file  Food Insecurity: No Food Insecurity (05/03/2023)   Hunger Vital Sign    Worried About Running Out of Food in the Last Year: Never true    Ran Out of Food in the Last Year: Never true  Transportation Needs: No Transportation Needs (05/03/2023)   PRAPARE -  Administrator, Civil Service (Medical): No    Lack of Transportation (Non-Medical): No  Physical Activity: Not on file  Stress: Not on file  Social Connections: Not on file  Intimate Partner Violence: Not At Risk (05/03/2023)   Humiliation, Afraid, Rape, and Kick questionnaire    Fear of Current or Ex-Partner: No    Emotionally Abused: No    Physically Abused: No    Sexually Abused: No    Review of Systems   General: Negative for anorexia, weight loss, fever, chills, fatigue, weakness. ENT: Negative for hoarseness, difficulty swallowing , nasal congestion. CV: Negative for chest pain, angina, palpitations, dyspnea on exertion, peripheral edema.  Respiratory: Negative for dyspnea at rest, dyspnea on exertion, cough, sputum, wheezing.  GI: See history of present illness. GU:  Negative for dysuria, hematuria, urinary incontinence, urinary frequency, nocturnal urination.  Endo: Negative for unusual weight change.     Physical Exam   There were no vitals taken for this visit.   General: Well-nourished, well-developed in no acute distress.  Eyes: No icterus. Mouth: Oropharyngeal mucosa moist and pink , no lesions erythema or exudate. Lungs: Clear to auscultation bilaterally.  Heart: Regular rate and rhythm, no murmurs rubs or gallops.  Abdomen: Bowel sounds are normal, nontender, nondistended, no hepatosplenomegaly or masses,  no abdominal bruits or hernia , no rebound or guarding.  Rectal: ***  Extremities: No lower extremity edema. No clubbing or deformities. Neuro: Alert and oriented x 4   Skin: Warm and dry, no jaundice.   Psych: Alert and cooperative, normal mood and affect.  Labs   *** Imaging Studies   DG Abd 1 View  Result Date: 05/08/2023 CLINICAL DATA:  Ileus. EXAM: ABDOMEN - 1 VIEW COMPARISON:  May 04, 2023. FINDINGS: Stable mildly dilated and air-filled large and small bowel loops are noted.  No abnormal calcifications are noted. IMPRESSION: Stable  bowel dilatation is noted above most consistent with ileus. Electronically Signed   By: Lupita Raider M.D.   On: 05/08/2023 15:03   CT ABDOMEN PELVIS W CONTRAST  Result Date: 05/05/2023 CLINICAL DATA:  Abdominal pain and distention EXAM: CT ABDOMEN AND PELVIS WITH CONTRAST TECHNIQUE: Multidetector CT imaging of the abdomen and pelvis was performed using the standard protocol following bolus administration of intravenous contrast. RADIATION DOSE REDUCTION: This exam was performed according to the departmental dose-optimization program which includes automated exposure control, adjustment of the mA and/or kV according to patient size and/or use of iterative reconstruction technique. CONTRAST:  OMNIPAQUE IOHEXOL 300 MG/ML  SOLN COMPARISON:  Abdominal CT 05/01/2023. CT abdomen and pelvis 04/28/2023. FINDINGS: Lower chest: No acute abnormality. Hepatobiliary: Diffusely heterogeneous and hypoattenuating as seen on the prior study compatible with fatty infiltration and likely cirrhosis. Gallbladder and bile ducts are within normal limits. Pancreas: Unremarkable. No pancreatic ductal dilatation or surrounding inflammatory changes. Spleen: Normal in size without focal abnormality. Adrenals/Urinary Tract: Left renal cysts are present measuring up to 3.2 cm. There is no hydronephrosis. The adrenal glands bladder are within normal limits. Stomach/Bowel: Colon is diffusely distended with air-fluid levels. No transition point. No focal inflammation. The appendix is not seen. Small bowel and stomach are within normal limits. Vascular/Lymphatic: Aortic atherosclerosis. No enlarged abdominal or pelvic lymph nodes. Reproductive: Prostate is unremarkable. Other: There is trace ascites. There is diffuse abdominal wall edema. Musculoskeletal: No acute or significant osseous findings. IMPRESSION: 1. Colon is diffusely distended with air-fluid levels. No transition point. Findings are compatible with ileus. 2. Cirrhotic and  fatty infiltration of the liver. 3. Trace ascites. 4. Body wall edema. 5. Left Bosniak I benign renal cyst measuring 3.2 cm. No follow-up imaging is recommended. JACR 2018 Feb; 264-273, Management of the Incidental Renal Mass on CT, [employer] 2021; [id number], Bosniak Classification of Cystic Renal Masses, Version 2019. Aortic Atherosclerosis (ICD10-I70.0). Electronically Signed   By: Darliss Cheney M.D.   On: 05/05/2023 18:57   US Venous Img Lower Bilateral (DVT)  Result Date: 05/05/2023 CLINICAL DATA:  Bilateral lower extremity pain and edema for the past 2 weeks. History of smoking. Evaluate for DVT. EXAM: BILATERAL LOWER EXTREMITY VENOUS DOPPLER ULTRASOUND TECHNIQUE: Gray-scale sonography with graded compression, as well as color Doppler and duplex ultrasound were performed to evaluate the lower extremity deep venous systems from the level of the common femoral vein and including the common femoral, femoral, profunda femoral, popliteal and calf veins including the posterior tibial, peroneal and gastrocnemius veins when visible. The superficial great saphenous vein was also interrogated. Spectral Doppler was utilized to evaluate flow at rest and with distal augmentation maneuvers in the common femoral, femoral and popliteal veins. COMPARISON:  None Available. FINDINGS: RIGHT LOWER EXTREMITY Common Femoral Vein: No evidence of thrombus. Normal compressibility, respiratory phasicity and response to augmentation. Saphenofemoral Junction: No evidence of thrombus. Normal compressibility and flow on color Doppler imaging. Profunda Femoral Vein: No evidence of thrombus. Normal compressibility and flow on color Doppler imaging. Femoral Vein: No evidence of thrombus. Normal compressibility, respiratory phasicity and response to augmentation. Popliteal Vein: No evidence of thrombus. Normal compressibility, respiratory phasicity and response to augmentation. Rouleaux flow is noted within the left popliteal vein. Calf  Veins: No evidence of thrombus. Normal compressibility and flow on color Doppler imaging. Superficial Great Saphenous Vein: No evidence of thrombus. Normal compressibility. Other Findings:  None. LEFT LOWER EXTREMITY Common Femoral Vein: No evidence  of thrombus. Normal compressibility, respiratory phasicity and response to augmentation. Saphenofemoral Junction: No evidence of thrombus. Normal compressibility and flow on color Doppler imaging. Profunda Femoral Vein: No evidence of thrombus. Normal compressibility and flow on color Doppler imaging. Femoral Vein: No evidence of thrombus. Normal compressibility, respiratory phasicity and response to augmentation. Popliteal Vein: No evidence of thrombus. Normal compressibility, respiratory phasicity and response to augmentation. Calf Veins: No evidence of thrombus. Normal compressibility and flow on color Doppler imaging. Superficial Great Saphenous Vein: No evidence of thrombus. Normal compressibility. Other Findings:  None. IMPRESSION: No evidence of DVT within either lower extremity. Electronically Signed   By: Simonne Come M.D.   On: 05/05/2023 16:40   DG Abd 2 Views  Result Date: 05/04/2023 CLINICAL DATA:  Abdominal distension EXAM: ABDOMEN - 2 VIEW COMPARISON:  05/01/2023, 04/29/2023 FINDINGS: Progressive gaseous distension of both large and small bowel loops throughout the abdomen. No gross free intraperitoneal air. IMPRESSION: Progressive gaseous distension of both large and small bowel loops throughout the abdomen. Pattern favors ileus. Obstruction not excluded. Electronically Signed   By: Duanne Guess D.O.   On: 05/04/2023 18:54   [place] ASCITES (ABDOMEN LIMITED)  Result Date: 05/04/2023 CLINICAL DATA:  Abdominal ascites. EXAM: LIMITED ABDOMEN ULTRASOUND FOR ASCITES TECHNIQUE: Limited ultrasound survey for ascites was performed in all four abdominal quadrants. COMPARISON:  CT of the abdomen 05/01/2023 FINDINGS: Small volume ascites is present in the  right lower quadrant and at the midline. No significant fluid pockets are present to allow for paracentesis. IMPRESSION: Stable appearance of small volume ascites. Electronically Signed   By: Marin Roberts M.D.   On: 05/04/2023 12:41    Assessment       PLAN   ***   Leanna Battles. Melvyn Neth, MHS, PA-C [facility]

## 2023-06-03 ENCOUNTER — Inpatient Hospital Stay: Payer: 59 | Admitting: Gastroenterology

## 2023-06-03 ENCOUNTER — Encounter: Payer: Self-pay | Admitting: Gastroenterology

## 2023-06-03 DIAGNOSIS — I110 Hypertensive heart disease with heart failure: Secondary | ICD-10-CM | POA: Diagnosis not present

## 2023-06-03 DIAGNOSIS — R5381 Other malaise: Secondary | ICD-10-CM | POA: Diagnosis not present

## 2023-06-03 DIAGNOSIS — I5022 Chronic systolic (congestive) heart failure: Secondary | ICD-10-CM | POA: Diagnosis not present

## 2023-06-03 DIAGNOSIS — T402X5D Adverse effect of other opioids, subsequent encounter: Secondary | ICD-10-CM | POA: Diagnosis not present

## 2023-06-03 DIAGNOSIS — G8929 Other chronic pain: Secondary | ICD-10-CM | POA: Diagnosis not present

## 2023-06-03 DIAGNOSIS — K766 Portal hypertension: Secondary | ICD-10-CM | POA: Diagnosis not present

## 2023-06-03 DIAGNOSIS — K567 Ileus, unspecified: Secondary | ICD-10-CM | POA: Diagnosis not present

## 2023-06-03 DIAGNOSIS — K219 Gastro-esophageal reflux disease without esophagitis: Secondary | ICD-10-CM | POA: Diagnosis not present

## 2023-06-03 DIAGNOSIS — E871 Hypo-osmolality and hyponatremia: Secondary | ICD-10-CM | POA: Diagnosis not present

## 2023-06-03 DIAGNOSIS — D649 Anemia, unspecified: Secondary | ICD-10-CM | POA: Diagnosis not present

## 2023-06-03 DIAGNOSIS — E785 Hyperlipidemia, unspecified: Secondary | ICD-10-CM | POA: Diagnosis not present

## 2023-06-03 DIAGNOSIS — K5903 Drug induced constipation: Secondary | ICD-10-CM | POA: Diagnosis not present

## 2023-06-03 DIAGNOSIS — I456 Pre-excitation syndrome: Secondary | ICD-10-CM | POA: Diagnosis not present

## 2023-06-03 DIAGNOSIS — I959 Hypotension, unspecified: Secondary | ICD-10-CM | POA: Diagnosis not present

## 2023-06-03 DIAGNOSIS — K2270 Barrett's esophagus without dysplasia: Secondary | ICD-10-CM | POA: Diagnosis not present

## 2023-06-03 DIAGNOSIS — I700 Atherosclerosis of aorta: Secondary | ICD-10-CM | POA: Diagnosis not present

## 2023-06-03 DIAGNOSIS — D6959 Other secondary thrombocytopenia: Secondary | ICD-10-CM | POA: Diagnosis not present

## 2023-06-03 DIAGNOSIS — E876 Hypokalemia: Secondary | ICD-10-CM | POA: Diagnosis not present

## 2023-06-04 DIAGNOSIS — K766 Portal hypertension: Secondary | ICD-10-CM | POA: Diagnosis not present

## 2023-06-04 DIAGNOSIS — D6959 Other secondary thrombocytopenia: Secondary | ICD-10-CM | POA: Diagnosis not present

## 2023-06-04 DIAGNOSIS — G8929 Other chronic pain: Secondary | ICD-10-CM | POA: Diagnosis not present

## 2023-06-04 DIAGNOSIS — E876 Hypokalemia: Secondary | ICD-10-CM | POA: Diagnosis not present

## 2023-06-04 DIAGNOSIS — I456 Pre-excitation syndrome: Secondary | ICD-10-CM | POA: Diagnosis not present

## 2023-06-04 DIAGNOSIS — T402X5D Adverse effect of other opioids, subsequent encounter: Secondary | ICD-10-CM | POA: Diagnosis not present

## 2023-06-04 DIAGNOSIS — K2270 Barrett's esophagus without dysplasia: Secondary | ICD-10-CM | POA: Diagnosis not present

## 2023-06-04 DIAGNOSIS — K567 Ileus, unspecified: Secondary | ICD-10-CM | POA: Diagnosis not present

## 2023-06-04 DIAGNOSIS — K5903 Drug induced constipation: Secondary | ICD-10-CM | POA: Diagnosis not present

## 2023-06-04 DIAGNOSIS — I5022 Chronic systolic (congestive) heart failure: Secondary | ICD-10-CM | POA: Diagnosis not present

## 2023-06-04 DIAGNOSIS — E871 Hypo-osmolality and hyponatremia: Secondary | ICD-10-CM | POA: Diagnosis not present

## 2023-06-04 DIAGNOSIS — I110 Hypertensive heart disease with heart failure: Secondary | ICD-10-CM | POA: Diagnosis not present

## 2023-06-04 DIAGNOSIS — I700 Atherosclerosis of aorta: Secondary | ICD-10-CM | POA: Diagnosis not present

## 2023-06-04 DIAGNOSIS — D649 Anemia, unspecified: Secondary | ICD-10-CM | POA: Diagnosis not present

## 2023-06-04 DIAGNOSIS — E785 Hyperlipidemia, unspecified: Secondary | ICD-10-CM | POA: Diagnosis not present

## 2023-06-04 DIAGNOSIS — R5381 Other malaise: Secondary | ICD-10-CM | POA: Diagnosis not present

## 2023-06-04 DIAGNOSIS — I959 Hypotension, unspecified: Secondary | ICD-10-CM | POA: Diagnosis not present

## 2023-06-04 DIAGNOSIS — K219 Gastro-esophageal reflux disease without esophagitis: Secondary | ICD-10-CM | POA: Diagnosis not present

## 2023-06-07 NOTE — Progress Notes (Signed)
GI Office Note    Referring Provider: Elfredia Nevins, MD Primary Care Physician:  Elfredia Nevins, MD  Primary Gastroenterologist: Roetta Sessions, MD   Chief Complaint   Chief Complaint  Patient presents with   Follow-up    History of Present Illness   TASHI BAND is a 52 y.o. male presenting today for hospital follow up. Has been discharged from SNF recently (went for rehab after recent admission).   Recent admission for abdominal pain and decompensated cirrhosis in setting of etoh use. MELD 3.0 19 at discharge. Abdominal distention in setting of ileus. No ascites to tap. ETOH hepatitis treated with Prednisolone. Plans for EGD/colonoscopy as outpatient. Anemia 10-11 range. No overt GI bleeding.   EGD in January 2024 for hematemesis with grade D reflux esophagitis with ulceration, recently bleeding gastritis biopsied. No esophageal varices. Recommend repeat EGD 8 weeks thereafter though he was lost to follow-up for this. 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   Today: fell this morning coming out of his house. Got to the bottom two steps, thought he was on the bottom step. Hit his leg and buttocks. No etoh since before his admission in 04/2023. BM regular, soft/loose, 2-3 times per day on lactulose. Currently taking two tablespoons 3-4 times per day. No melena. No recent brbpr. No blood in the stool since in the hospital. Having some heartburn. Lasting short periods of time. Dysphagia at times if drinking something really cold, lasting seconds at a time.     CT A/P with contrast 05/05/2023: IMPRESSION: 1. Colon is diffusely distended with air-fluid levels. No transition point. Findings are compatible with ileus. 2. Cirrhotic and fatty infiltration of the liver. 3. Trace ascites. 4. Body wall edema. 5. Left Bosniak I benign renal cyst measuring 3.2 cm. No  follow-up imaging is recommended.  PATIENT DID NOT BRING HIS CURRENT MEDICATION LIST  Medications   Current Outpatient Medications  Medication Sig Dispense Refill   albuterol (VENTOLIN HFA) 108 (90 Base) MCG/ACT inhaler Inhale 2 puffs into the lungs every 6 (six) hours as needed for wheezing or shortness of breath.     carvedilol (COREG) 6.25 MG tablet Take 1 tablet (6.25 mg total) by mouth 2 (two) times daily with a meal.     cephALEXin (KEFLEX) 500 MG capsule Take 500 mg by mouth every 6 (six) hours.     Cholecalciferol (VITAMIN D3) 25 MCG (1000 UT) CAPS Take 2 capsules by mouth every evening.     folic acid (FOLVITE) 1 MG tablet Take 1 tablet (1 mg total) by mouth daily. 30 tablet 2   furosemide (LASIX) 20 MG tablet Take 1 tablet (20 mg total) by mouth daily.     gabapentin (NEURONTIN) 100 MG capsule Take 2 capsules (200 mg total) by mouth 3 (three) times daily. (Patient taking differently: Take 200 mg by mouth 2 (two) times daily.)     hydrOXYzine (ATARAX/VISTARIL) 25 MG tablet Take 1 tablet (25 mg total) by mouth 3 (three) times daily as needed for anxiety or nausea. 60 tablet 2   lactulose (CHRONULAC) 10 GM/15ML solution Take 45 mLs (30 g total) by mouth 2 (two) times daily.     lubiprostone (AMITIZA) 8 MCG capsule Take by mouth.     oxyCODONE (ROXICODONE) 15 MG immediate release tablet Take 1 tablet (15 mg total) by mouth every 6 (six) hours as needed for pain. 12 tablet 0  pantoprazole (PROTONIX) 40 MG tablet Take 40 mg by mouth 2 (two) times daily.     potassium chloride SA (KLOR-CON M) 20 MEQ tablet Take 2 tablets (40 mEq total) by mouth daily.     prednisoLONE 5 MG TABS tablet 40 mg daily for a total of 21 more days (starting 05/13/23); Then 30 mg daily (starting 06/03/23) for 1 week, 20 mg daily(starting 06/10/23) for 1 week, 10 mg daily (starting 06/17/23) for 1 week, then stop.     spironolactone (ALDACTONE) 50 MG tablet Take 1 tablet (50 mg total) by mouth daily.     thiamine  (VITAMIN B-1) 100 MG tablet Take 1 tablet (100 mg total) by mouth daily. 30 tablet 0   traZODone (DESYREL) 100 MG tablet Take 1 tablet (100 mg total) by mouth at bedtime.     No current facility-administered medications for this visit.    Allergies   Allergies as of 06/08/2023 - Review Complete 06/08/2023  Allergen Reaction Noted   Morphine Other (See Comments)      Past Medical History   Past Medical History:  Diagnosis Date   Anxiety    Arthritis    CAD (coronary artery disease) 2007   50% stenosis small diagonal   Central sleep apnea    Chronic pain    since 1998 has been on chronic pain medication   Fatty liver    GERD (gastroesophageal reflux disease)    Hemorrhoids    Hepatomegaly 10/03/2014   HTN (hypertension)    Palpitations    Syncope    Tachycardia    Tobacco abuse    Tubular adenoma    Wolff-Parkinson-White (WPW) syndrome    says was cured with ablation    Past Surgical History   Past Surgical History:  Procedure Laterality Date   ADENOIDECTOMY     APPENDECTOMY     ATRIAL ABLATION SURGERY     AV NODE ABLATION     BIOPSY N/A 05/12/2013   Procedure: BIOPSY;  Surgeon: Corbin Ade, MD;  Location: [facility];  Service: Endoscopy;  Laterality: N/A;   BIOPSY  12/20/2020   Procedure: BIOPSY;  Surgeon: Corbin Ade, MD;  Location: [facility];  Service: Endoscopy;;   BIOPSY  09/18/2022   Procedure: BIOPSY;  Surgeon: Tressia Danas, MD;  Location: [facility] ENDOSCOPY;  Service: Gastroenterology;;   CARDIAC CATHETERIZATION N/A 05/08/2016   Procedure: Right/Left Heart Cath and Coronary Angiography;  Surgeon: Peter M Swaziland, MD;  Location: [facility] INVASIVE CV LAB;  Service: Cardiovascular;  Laterality: N/A;   CARDIAC ELECTROPHYSIOLOGY STUDY AND ABLATION     SA node ablation   COLONOSCOPY     age 41   COLONOSCOPY WITH PROPOFOL N/A 05/12/2013   Dr. Jena Gauss- hemorrhoids, tubular adenoma   ESOPHAGEAL DILATION  12/20/2020   Procedure: ESOPHAGEAL DILATION;  Surgeon: Corbin Ade, MD;  Location: [facility];  Service: Endoscopy;;   ESOPHAGEAL DILATION N/A 12/20/2020   Procedure: ESOPHAGEAL DILATION;  Surgeon: Corbin Ade, MD;  Location: [facility];  Service: Endoscopy;  Laterality: N/A;   ESOPHAGOGASTRODUODENOSCOPY (EGD) WITH PROPOFOL N/A 05/12/2013   Dr. Jena Gauss- hiatal hernia, erosive reflux esophagitis   ESOPHAGOGASTRODUODENOSCOPY (EGD) WITH PROPOFOL N/A 12/20/2020   Procedure: ESOPHAGOGASTRODUODENOSCOPY (EGD) WITH PROPOFOL;  Surgeon: Corbin Ade, MD;  Location: [facility];  Service: Endoscopy;  Laterality: N/A;   ESOPHAGOGASTRODUODENOSCOPY (EGD) WITH PROPOFOL N/A 09/18/2022   Procedure: ESOPHAGOGASTRODUODENOSCOPY (EGD) WITH PROPOFOL;  Surgeon: Tressia Danas, MD;  Location: [facility] ENDOSCOPY;  Service:  Gastroenterology;  Laterality: N/A;   HAND SURGERY     HEMORRHOID BANDING  06/22/13   POLYPECTOMY N/A 05/12/2013   Procedure: POLYPECTOMY;  Surgeon: Corbin Ade, MD;  Location: [facility];  Service: Endoscopy;  Laterality: N/A;   SHOULDER SURGERY      Past Family History   Family History  Problem Relation Age of Onset   Coronary artery disease Father        cabg   Colon cancer Father        age 85, chemo/surgery   Breast cancer Mother    Hypertension Mother     Past Social History   Social History   Socioeconomic History   Marital status: Married    Spouse name: Not on file   Number of children: 2   Years of education: Not on file   Highest education level: Not on file  Occupational History   Occupation: unemployed    Employer: UNEMPLOYED  Tobacco Use   Smoking status: Every Day    Current packs/day: 0.50    Average packs/day: 0.5 packs/day for 25.0 years (12.5 ttl pk-yrs)    Types: Cigarettes   Smokeless tobacco: Never  Vaping Use   Vaping status: Never Used  Substance and Sexual Activity   Alcohol use: Yes    Comment: daily; 12/18/20-5 days/week, 6 drinks at time   Drug use: Yes    Types: Marijuana    Comment: hx marijuana use    Sexual activity: Yes    Birth control/protection: None  Other Topics Concern   Not on file  Social History Narrative   Not on file   Social Determinants of Health   Financial Resource Strain: Not on file  Food Insecurity: No Food Insecurity (05/03/2023)   Hunger Vital Sign    Worried About Running Out of Food in the Last Year: Never true    Ran Out of Food in the Last Year: Never true  Transportation Needs: No Transportation Needs (05/03/2023)   PRAPARE - Administrator, Civil Service (Medical): No    Lack of Transportation (Non-Medical): No  Physical Activity: Not on file  Stress: Not on file  Social Connections: Not on file  Intimate Partner Violence: Not At Risk (05/03/2023)   Humiliation, Afraid, Rape, and Kick questionnaire    Fear of Current or Ex-Partner: No    Emotionally Abused: No    Physically Abused: No    Sexually Abused: No    Review of Systems   General: Negative for anorexia, weight loss, fever, chills, fatigue, +weakness. ENT: Negative for hoarseness, difficulty swallowing , nasal congestion. CV: Negative for chest pain, angina, palpitations, dyspnea on exertion, +peripheral edema.  Respiratory: Negative for dyspnea at rest, dyspnea on exertion, cough, sputum, wheezing.  GI: See history of present illness. GU:  Negative for dysuria, hematuria, urinary incontinence, urinary frequency, nocturnal urination.  Endo: Negative for unusual weight change.     Physical Exam   BP (!) 145/81 (BP Location: Right Arm, Patient Position: Sitting, Cuff Size: Normal)   Pulse 83   Temp 97.6 F (36.4 C) (Oral)   Ht 6' (1.829 m)   Wt 202 lb 3.2 oz (91.7 kg)   SpO2 97%   BMI 27.42 kg/m    General: chronically ill appearing male in NAD.  Eyes: No icterus. Mouth: Oropharyngeal mucosa moist and pink  Lungs: Clear to auscultation bilaterally.  Heart: Regular rate and rhythm, no murmurs rubs or gallops.  Abdomen: Bowel sounds are normal, nontender,  nondistended,  no hepatosplenomegaly or masses,  no abdominal bruits or hernia , no rebound or guarding. Tenderness along upper abdomen and anterior ribs. Old bruising left flank Rectal: not performed  Extremities: No lower extremity edema. No clubbing or deformities. Neuro: Alert and oriented x 4   Skin: Warm and dry, no jaundice.   Psych: Alert and cooperative, normal mood and affect.  Labs   Lab Results  Component Value Date   NA 133 (L) 05/12/2023   CL 104 05/12/2023   K 3.9 05/12/2023   CO2 22 05/12/2023   BUN 12 05/12/2023   CREATININE 0.93 05/12/2023   GFRNONAA >60 05/12/2023   CALCIUM 9.1 05/12/2023   PHOS 3.9 05/12/2023   ALBUMIN 3.0 (L) 05/12/2023   GLUCOSE 75 05/12/2023   Lab Results  Component Value Date   ALT 50 (H) 05/12/2023   AST 78 (H) 05/12/2023   ALKPHOS 85 05/12/2023   BILITOT 3.3 (H) 05/12/2023   Lab Results  Component Value Date   WBC 7.4 05/11/2023   HGB 10.8 (L) 05/11/2023   HCT 33.5 (L) 05/11/2023   MCV 101.8 (H) 05/11/2023   PLT 249 05/11/2023   Lab Results  Component Value Date   IRON 162 04/29/2023   TIBC 194 (L) 04/29/2023   FERRITIN 149 04/29/2023   Lab Results  Component Value Date   VITAMINB12 999 (H) 04/29/2023   Lab Results  Component Value Date   FOLATE 3.6 (L) 04/29/2023   Lab Results  Component Value Date   INR 1.6 (H) 05/12/2023   INR 1.6 (H) 05/11/2023   INR 1.6 (H) 05/10/2023    Imaging Studies   No results found.  Assessment   *GERD *Barrett's esophagus *Rectal bleeding *Anemia *Decompensated cirrhosis *ETOH hepatitis  Recommend updated EGD/colonoscopy due to history of Barrett's, anemia, rectal bleeding, repeat EGD due to severe grade D reflux esophagitis with ulceration in 09/2022. Last esophageal biopsies of Barrett's in 2022. Typical reflux with some breakthrough symptoms and vague dysphagia with cold liquids (possibly spasms).   Decompensated cirrhosis in setting of etoh use. Recent admission with ETOH hepatitis  as well. Discharged on prednisolone with taper. Due for updated labs.   Ileus has resolved.   PLAN   Need updated medication list from patient. Labs to include CBC, CMET, lipase, inr, iron/tibc/ferritin, folate, Hep B surface antibody, Hep A total antibody. Once medication list and labs updated, we will move forward with EGD/colonoscopy. Continue etoh cessation.     Leanna Battles. Melvyn Neth, MHS, PA-C [facility]

## 2023-06-08 ENCOUNTER — Encounter: Payer: Self-pay | Admitting: Gastroenterology

## 2023-06-08 ENCOUNTER — Ambulatory Visit (INDEPENDENT_AMBULATORY_CARE_PROVIDER_SITE_OTHER): Payer: 59 | Admitting: Gastroenterology

## 2023-06-08 VITALS — BP 124/82 | HR 80 | Temp 97.6°F | Ht 72.0 in | Wt 202.2 lb

## 2023-06-08 DIAGNOSIS — K2270 Barrett's esophagus without dysplasia: Secondary | ICD-10-CM

## 2023-06-08 DIAGNOSIS — K625 Hemorrhage of anus and rectum: Secondary | ICD-10-CM

## 2023-06-08 DIAGNOSIS — K219 Gastro-esophageal reflux disease without esophagitis: Secondary | ICD-10-CM

## 2023-06-08 DIAGNOSIS — K7030 Alcoholic cirrhosis of liver without ascites: Secondary | ICD-10-CM

## 2023-06-08 DIAGNOSIS — K7010 Alcoholic hepatitis without ascites: Secondary | ICD-10-CM

## 2023-06-08 NOTE — Patient Instructions (Signed)
Please complete your labs at [facility].  Please compare the medication list provided today with what you are taking at home. Please either call back [phone]) in with any changes or send in via fax. [phone].

## 2023-06-09 DIAGNOSIS — K567 Ileus, unspecified: Secondary | ICD-10-CM | POA: Diagnosis not present

## 2023-06-09 DIAGNOSIS — I700 Atherosclerosis of aorta: Secondary | ICD-10-CM | POA: Diagnosis not present

## 2023-06-09 DIAGNOSIS — G8929 Other chronic pain: Secondary | ICD-10-CM | POA: Diagnosis not present

## 2023-06-09 DIAGNOSIS — I456 Pre-excitation syndrome: Secondary | ICD-10-CM | POA: Diagnosis not present

## 2023-06-09 DIAGNOSIS — I110 Hypertensive heart disease with heart failure: Secondary | ICD-10-CM | POA: Diagnosis not present

## 2023-06-09 DIAGNOSIS — R5381 Other malaise: Secondary | ICD-10-CM | POA: Diagnosis not present

## 2023-06-09 DIAGNOSIS — E785 Hyperlipidemia, unspecified: Secondary | ICD-10-CM | POA: Diagnosis not present

## 2023-06-09 DIAGNOSIS — I5022 Chronic systolic (congestive) heart failure: Secondary | ICD-10-CM | POA: Diagnosis not present

## 2023-06-09 DIAGNOSIS — D6959 Other secondary thrombocytopenia: Secondary | ICD-10-CM | POA: Diagnosis not present

## 2023-06-09 DIAGNOSIS — K2270 Barrett's esophagus without dysplasia: Secondary | ICD-10-CM | POA: Diagnosis not present

## 2023-06-09 DIAGNOSIS — D649 Anemia, unspecified: Secondary | ICD-10-CM | POA: Diagnosis not present

## 2023-06-09 DIAGNOSIS — E871 Hypo-osmolality and hyponatremia: Secondary | ICD-10-CM | POA: Diagnosis not present

## 2023-06-09 DIAGNOSIS — K5903 Drug induced constipation: Secondary | ICD-10-CM | POA: Diagnosis not present

## 2023-06-09 DIAGNOSIS — I959 Hypotension, unspecified: Secondary | ICD-10-CM | POA: Diagnosis not present

## 2023-06-09 DIAGNOSIS — K766 Portal hypertension: Secondary | ICD-10-CM | POA: Diagnosis not present

## 2023-06-09 DIAGNOSIS — E876 Hypokalemia: Secondary | ICD-10-CM | POA: Diagnosis not present

## 2023-06-09 DIAGNOSIS — T402X5D Adverse effect of other opioids, subsequent encounter: Secondary | ICD-10-CM | POA: Diagnosis not present

## 2023-06-09 DIAGNOSIS — K219 Gastro-esophageal reflux disease without esophagitis: Secondary | ICD-10-CM | POA: Diagnosis not present

## 2023-06-10 DIAGNOSIS — K766 Portal hypertension: Secondary | ICD-10-CM | POA: Diagnosis not present

## 2023-06-10 DIAGNOSIS — I110 Hypertensive heart disease with heart failure: Secondary | ICD-10-CM | POA: Diagnosis not present

## 2023-06-11 DIAGNOSIS — T402X5D Adverse effect of other opioids, subsequent encounter: Secondary | ICD-10-CM | POA: Diagnosis not present

## 2023-06-11 DIAGNOSIS — E785 Hyperlipidemia, unspecified: Secondary | ICD-10-CM | POA: Diagnosis not present

## 2023-06-11 DIAGNOSIS — E871 Hypo-osmolality and hyponatremia: Secondary | ICD-10-CM | POA: Diagnosis not present

## 2023-06-11 DIAGNOSIS — I456 Pre-excitation syndrome: Secondary | ICD-10-CM | POA: Diagnosis not present

## 2023-06-11 DIAGNOSIS — K2270 Barrett's esophagus without dysplasia: Secondary | ICD-10-CM | POA: Diagnosis not present

## 2023-06-11 DIAGNOSIS — D6959 Other secondary thrombocytopenia: Secondary | ICD-10-CM | POA: Diagnosis not present

## 2023-06-11 DIAGNOSIS — I959 Hypotension, unspecified: Secondary | ICD-10-CM | POA: Diagnosis not present

## 2023-06-11 DIAGNOSIS — D649 Anemia, unspecified: Secondary | ICD-10-CM | POA: Diagnosis not present

## 2023-06-11 DIAGNOSIS — R5381 Other malaise: Secondary | ICD-10-CM | POA: Diagnosis not present

## 2023-06-11 DIAGNOSIS — I700 Atherosclerosis of aorta: Secondary | ICD-10-CM | POA: Diagnosis not present

## 2023-06-11 DIAGNOSIS — I110 Hypertensive heart disease with heart failure: Secondary | ICD-10-CM | POA: Diagnosis not present

## 2023-06-11 DIAGNOSIS — G8929 Other chronic pain: Secondary | ICD-10-CM | POA: Diagnosis not present

## 2023-06-11 DIAGNOSIS — K567 Ileus, unspecified: Secondary | ICD-10-CM | POA: Diagnosis not present

## 2023-06-11 DIAGNOSIS — K5903 Drug induced constipation: Secondary | ICD-10-CM | POA: Diagnosis not present

## 2023-06-11 DIAGNOSIS — E876 Hypokalemia: Secondary | ICD-10-CM | POA: Diagnosis not present

## 2023-06-11 DIAGNOSIS — K766 Portal hypertension: Secondary | ICD-10-CM | POA: Diagnosis not present

## 2023-06-11 DIAGNOSIS — K219 Gastro-esophageal reflux disease without esophagitis: Secondary | ICD-10-CM | POA: Diagnosis not present

## 2023-06-11 DIAGNOSIS — I5022 Chronic systolic (congestive) heart failure: Secondary | ICD-10-CM | POA: Diagnosis not present

## 2023-06-13 ENCOUNTER — Telehealth: Payer: Self-pay | Admitting: Gastroenterology

## 2023-06-13 DIAGNOSIS — K7010 Alcoholic hepatitis without ascites: Secondary | ICD-10-CM | POA: Insufficient documentation

## 2023-06-13 NOTE — Telephone Encounter (Signed)
Please remind patient that we need updated medication list. He was not able to reconcile meds while in office this week. Will need med list and updated labs prior to moving forward with TCS/EGD.

## 2023-06-15 NOTE — Telephone Encounter (Signed)
Pt states that he is going to have his labs done tomorrow as well as pick up an updated med list.

## 2023-06-16 DIAGNOSIS — I456 Pre-excitation syndrome: Secondary | ICD-10-CM | POA: Diagnosis not present

## 2023-06-16 DIAGNOSIS — K219 Gastro-esophageal reflux disease without esophagitis: Secondary | ICD-10-CM | POA: Diagnosis not present

## 2023-06-16 DIAGNOSIS — I5022 Chronic systolic (congestive) heart failure: Secondary | ICD-10-CM | POA: Diagnosis not present

## 2023-06-16 DIAGNOSIS — K5903 Drug induced constipation: Secondary | ICD-10-CM | POA: Diagnosis not present

## 2023-06-16 DIAGNOSIS — E871 Hypo-osmolality and hyponatremia: Secondary | ICD-10-CM | POA: Diagnosis not present

## 2023-06-16 DIAGNOSIS — D649 Anemia, unspecified: Secondary | ICD-10-CM | POA: Diagnosis not present

## 2023-06-16 DIAGNOSIS — D6959 Other secondary thrombocytopenia: Secondary | ICD-10-CM | POA: Diagnosis not present

## 2023-06-16 DIAGNOSIS — K2270 Barrett's esophagus without dysplasia: Secondary | ICD-10-CM | POA: Diagnosis not present

## 2023-06-16 DIAGNOSIS — I110 Hypertensive heart disease with heart failure: Secondary | ICD-10-CM | POA: Diagnosis not present

## 2023-06-16 DIAGNOSIS — R5381 Other malaise: Secondary | ICD-10-CM | POA: Diagnosis not present

## 2023-06-16 DIAGNOSIS — K567 Ileus, unspecified: Secondary | ICD-10-CM | POA: Diagnosis not present

## 2023-06-16 DIAGNOSIS — K766 Portal hypertension: Secondary | ICD-10-CM | POA: Diagnosis not present

## 2023-06-16 DIAGNOSIS — I959 Hypotension, unspecified: Secondary | ICD-10-CM | POA: Diagnosis not present

## 2023-06-16 DIAGNOSIS — T402X5D Adverse effect of other opioids, subsequent encounter: Secondary | ICD-10-CM | POA: Diagnosis not present

## 2023-06-16 DIAGNOSIS — E876 Hypokalemia: Secondary | ICD-10-CM | POA: Diagnosis not present

## 2023-06-16 DIAGNOSIS — E785 Hyperlipidemia, unspecified: Secondary | ICD-10-CM | POA: Diagnosis not present

## 2023-06-16 DIAGNOSIS — G8929 Other chronic pain: Secondary | ICD-10-CM | POA: Diagnosis not present

## 2023-06-16 DIAGNOSIS — I700 Atherosclerosis of aorta: Secondary | ICD-10-CM | POA: Diagnosis not present

## 2023-06-18 DIAGNOSIS — I456 Pre-excitation syndrome: Secondary | ICD-10-CM | POA: Diagnosis not present

## 2023-06-18 DIAGNOSIS — K766 Portal hypertension: Secondary | ICD-10-CM | POA: Diagnosis not present

## 2023-06-18 DIAGNOSIS — K2270 Barrett's esophagus without dysplasia: Secondary | ICD-10-CM | POA: Diagnosis not present

## 2023-06-18 DIAGNOSIS — I959 Hypotension, unspecified: Secondary | ICD-10-CM | POA: Diagnosis not present

## 2023-06-18 DIAGNOSIS — K567 Ileus, unspecified: Secondary | ICD-10-CM | POA: Diagnosis not present

## 2023-06-18 DIAGNOSIS — I110 Hypertensive heart disease with heart failure: Secondary | ICD-10-CM | POA: Diagnosis not present

## 2023-06-18 DIAGNOSIS — R5381 Other malaise: Secondary | ICD-10-CM | POA: Diagnosis not present

## 2023-06-18 DIAGNOSIS — T402X5D Adverse effect of other opioids, subsequent encounter: Secondary | ICD-10-CM | POA: Diagnosis not present

## 2023-06-18 DIAGNOSIS — D6959 Other secondary thrombocytopenia: Secondary | ICD-10-CM | POA: Diagnosis not present

## 2023-06-18 DIAGNOSIS — K5903 Drug induced constipation: Secondary | ICD-10-CM | POA: Diagnosis not present

## 2023-06-18 DIAGNOSIS — E871 Hypo-osmolality and hyponatremia: Secondary | ICD-10-CM | POA: Diagnosis not present

## 2023-06-18 DIAGNOSIS — D649 Anemia, unspecified: Secondary | ICD-10-CM | POA: Diagnosis not present

## 2023-06-18 DIAGNOSIS — G8929 Other chronic pain: Secondary | ICD-10-CM | POA: Diagnosis not present

## 2023-06-18 DIAGNOSIS — E785 Hyperlipidemia, unspecified: Secondary | ICD-10-CM | POA: Diagnosis not present

## 2023-06-18 DIAGNOSIS — E876 Hypokalemia: Secondary | ICD-10-CM | POA: Diagnosis not present

## 2023-06-18 DIAGNOSIS — I5022 Chronic systolic (congestive) heart failure: Secondary | ICD-10-CM | POA: Diagnosis not present

## 2023-06-18 DIAGNOSIS — K219 Gastro-esophageal reflux disease without esophagitis: Secondary | ICD-10-CM | POA: Diagnosis not present

## 2023-06-18 DIAGNOSIS — I700 Atherosclerosis of aorta: Secondary | ICD-10-CM | POA: Diagnosis not present

## 2023-06-22 DIAGNOSIS — G8929 Other chronic pain: Secondary | ICD-10-CM | POA: Diagnosis not present

## 2023-06-22 DIAGNOSIS — I959 Hypotension, unspecified: Secondary | ICD-10-CM | POA: Diagnosis not present

## 2023-06-22 DIAGNOSIS — I110 Hypertensive heart disease with heart failure: Secondary | ICD-10-CM | POA: Diagnosis not present

## 2023-06-22 DIAGNOSIS — T402X5D Adverse effect of other opioids, subsequent encounter: Secondary | ICD-10-CM | POA: Diagnosis not present

## 2023-06-22 DIAGNOSIS — D649 Anemia, unspecified: Secondary | ICD-10-CM | POA: Diagnosis not present

## 2023-06-22 DIAGNOSIS — I700 Atherosclerosis of aorta: Secondary | ICD-10-CM | POA: Diagnosis not present

## 2023-06-22 DIAGNOSIS — I5022 Chronic systolic (congestive) heart failure: Secondary | ICD-10-CM | POA: Diagnosis not present

## 2023-06-22 DIAGNOSIS — D6959 Other secondary thrombocytopenia: Secondary | ICD-10-CM | POA: Diagnosis not present

## 2023-06-22 DIAGNOSIS — K2270 Barrett's esophagus without dysplasia: Secondary | ICD-10-CM | POA: Diagnosis not present

## 2023-06-22 DIAGNOSIS — I456 Pre-excitation syndrome: Secondary | ICD-10-CM | POA: Diagnosis not present

## 2023-06-22 DIAGNOSIS — E876 Hypokalemia: Secondary | ICD-10-CM | POA: Diagnosis not present

## 2023-06-22 DIAGNOSIS — R5381 Other malaise: Secondary | ICD-10-CM | POA: Diagnosis not present

## 2023-06-22 DIAGNOSIS — K567 Ileus, unspecified: Secondary | ICD-10-CM | POA: Diagnosis not present

## 2023-06-22 DIAGNOSIS — E871 Hypo-osmolality and hyponatremia: Secondary | ICD-10-CM | POA: Diagnosis not present

## 2023-06-22 DIAGNOSIS — K219 Gastro-esophageal reflux disease without esophagitis: Secondary | ICD-10-CM | POA: Diagnosis not present

## 2023-06-22 DIAGNOSIS — E785 Hyperlipidemia, unspecified: Secondary | ICD-10-CM | POA: Diagnosis not present

## 2023-06-22 DIAGNOSIS — K766 Portal hypertension: Secondary | ICD-10-CM | POA: Diagnosis not present

## 2023-06-22 DIAGNOSIS — K5903 Drug induced constipation: Secondary | ICD-10-CM | POA: Diagnosis not present

## 2023-06-23 DIAGNOSIS — K766 Portal hypertension: Secondary | ICD-10-CM | POA: Diagnosis not present

## 2023-06-23 DIAGNOSIS — I700 Atherosclerosis of aorta: Secondary | ICD-10-CM | POA: Diagnosis not present

## 2023-06-23 DIAGNOSIS — I456 Pre-excitation syndrome: Secondary | ICD-10-CM | POA: Diagnosis not present

## 2023-06-23 DIAGNOSIS — T402X5D Adverse effect of other opioids, subsequent encounter: Secondary | ICD-10-CM | POA: Diagnosis not present

## 2023-06-23 DIAGNOSIS — E871 Hypo-osmolality and hyponatremia: Secondary | ICD-10-CM | POA: Diagnosis not present

## 2023-06-23 DIAGNOSIS — I959 Hypotension, unspecified: Secondary | ICD-10-CM | POA: Diagnosis not present

## 2023-06-23 DIAGNOSIS — D649 Anemia, unspecified: Secondary | ICD-10-CM | POA: Diagnosis not present

## 2023-06-23 DIAGNOSIS — K5903 Drug induced constipation: Secondary | ICD-10-CM | POA: Diagnosis not present

## 2023-06-23 DIAGNOSIS — D6959 Other secondary thrombocytopenia: Secondary | ICD-10-CM | POA: Diagnosis not present

## 2023-06-23 DIAGNOSIS — E876 Hypokalemia: Secondary | ICD-10-CM | POA: Diagnosis not present

## 2023-06-23 DIAGNOSIS — R5381 Other malaise: Secondary | ICD-10-CM | POA: Diagnosis not present

## 2023-06-23 DIAGNOSIS — E785 Hyperlipidemia, unspecified: Secondary | ICD-10-CM | POA: Diagnosis not present

## 2023-06-23 DIAGNOSIS — K2270 Barrett's esophagus without dysplasia: Secondary | ICD-10-CM | POA: Diagnosis not present

## 2023-06-23 DIAGNOSIS — I5022 Chronic systolic (congestive) heart failure: Secondary | ICD-10-CM | POA: Diagnosis not present

## 2023-06-23 DIAGNOSIS — K219 Gastro-esophageal reflux disease without esophagitis: Secondary | ICD-10-CM | POA: Diagnosis not present

## 2023-06-23 DIAGNOSIS — I110 Hypertensive heart disease with heart failure: Secondary | ICD-10-CM | POA: Diagnosis not present

## 2023-06-23 DIAGNOSIS — G8929 Other chronic pain: Secondary | ICD-10-CM | POA: Diagnosis not present

## 2023-06-23 DIAGNOSIS — K567 Ileus, unspecified: Secondary | ICD-10-CM | POA: Diagnosis not present

## 2023-07-01 NOTE — Telephone Encounter (Signed)
Lmom for pt to return call. 

## 2023-07-01 NOTE — Telephone Encounter (Signed)
Timothy [name], no labs or med list yet. Let's remind patient once more and if he does not comply then we will move forward and wait to hear from him.

## 2023-07-02 DIAGNOSIS — I456 Pre-excitation syndrome: Secondary | ICD-10-CM | POA: Diagnosis not present

## 2023-07-02 DIAGNOSIS — E871 Hypo-osmolality and hyponatremia: Secondary | ICD-10-CM | POA: Diagnosis not present

## 2023-07-02 DIAGNOSIS — D6959 Other secondary thrombocytopenia: Secondary | ICD-10-CM | POA: Diagnosis not present

## 2023-07-02 DIAGNOSIS — I700 Atherosclerosis of aorta: Secondary | ICD-10-CM | POA: Diagnosis not present

## 2023-07-02 DIAGNOSIS — R5381 Other malaise: Secondary | ICD-10-CM | POA: Diagnosis not present

## 2023-07-02 DIAGNOSIS — K5903 Drug induced constipation: Secondary | ICD-10-CM | POA: Diagnosis not present

## 2023-07-02 DIAGNOSIS — E876 Hypokalemia: Secondary | ICD-10-CM | POA: Diagnosis not present

## 2023-07-02 DIAGNOSIS — K567 Ileus, unspecified: Secondary | ICD-10-CM | POA: Diagnosis not present

## 2023-07-02 DIAGNOSIS — M4722 Other spondylosis with radiculopathy, cervical region: Secondary | ICD-10-CM | POA: Diagnosis not present

## 2023-07-02 DIAGNOSIS — K2270 Barrett's esophagus without dysplasia: Secondary | ICD-10-CM | POA: Diagnosis not present

## 2023-07-02 DIAGNOSIS — K219 Gastro-esophageal reflux disease without esophagitis: Secondary | ICD-10-CM | POA: Diagnosis not present

## 2023-07-02 DIAGNOSIS — E785 Hyperlipidemia, unspecified: Secondary | ICD-10-CM | POA: Diagnosis not present

## 2023-07-02 DIAGNOSIS — K766 Portal hypertension: Secondary | ICD-10-CM | POA: Diagnosis not present

## 2023-07-02 DIAGNOSIS — D649 Anemia, unspecified: Secondary | ICD-10-CM | POA: Diagnosis not present

## 2023-07-02 DIAGNOSIS — I110 Hypertensive heart disease with heart failure: Secondary | ICD-10-CM | POA: Diagnosis not present

## 2023-07-02 DIAGNOSIS — I5022 Chronic systolic (congestive) heart failure: Secondary | ICD-10-CM | POA: Diagnosis not present

## 2023-07-02 DIAGNOSIS — I959 Hypotension, unspecified: Secondary | ICD-10-CM | POA: Diagnosis not present

## 2023-07-02 DIAGNOSIS — T402X5D Adverse effect of other opioids, subsequent encounter: Secondary | ICD-10-CM | POA: Diagnosis not present

## 2023-07-02 DIAGNOSIS — G8929 Other chronic pain: Secondary | ICD-10-CM | POA: Diagnosis not present

## 2023-07-06 DIAGNOSIS — I110 Hypertensive heart disease with heart failure: Secondary | ICD-10-CM | POA: Diagnosis not present

## 2023-07-06 DIAGNOSIS — D649 Anemia, unspecified: Secondary | ICD-10-CM | POA: Diagnosis not present

## 2023-07-06 DIAGNOSIS — K2270 Barrett's esophagus without dysplasia: Secondary | ICD-10-CM | POA: Diagnosis not present

## 2023-07-06 DIAGNOSIS — K766 Portal hypertension: Secondary | ICD-10-CM | POA: Diagnosis not present

## 2023-07-06 DIAGNOSIS — D6959 Other secondary thrombocytopenia: Secondary | ICD-10-CM | POA: Diagnosis not present

## 2023-07-06 DIAGNOSIS — K5903 Drug induced constipation: Secondary | ICD-10-CM | POA: Diagnosis not present

## 2023-07-06 DIAGNOSIS — K567 Ileus, unspecified: Secondary | ICD-10-CM | POA: Diagnosis not present

## 2023-07-06 DIAGNOSIS — K219 Gastro-esophageal reflux disease without esophagitis: Secondary | ICD-10-CM | POA: Diagnosis not present

## 2023-07-06 DIAGNOSIS — I700 Atherosclerosis of aorta: Secondary | ICD-10-CM | POA: Diagnosis not present

## 2023-07-06 DIAGNOSIS — I456 Pre-excitation syndrome: Secondary | ICD-10-CM | POA: Diagnosis not present

## 2023-07-06 DIAGNOSIS — E785 Hyperlipidemia, unspecified: Secondary | ICD-10-CM | POA: Diagnosis not present

## 2023-07-06 DIAGNOSIS — E871 Hypo-osmolality and hyponatremia: Secondary | ICD-10-CM | POA: Diagnosis not present

## 2023-07-06 DIAGNOSIS — R5381 Other malaise: Secondary | ICD-10-CM | POA: Diagnosis not present

## 2023-07-06 DIAGNOSIS — E876 Hypokalemia: Secondary | ICD-10-CM | POA: Diagnosis not present

## 2023-07-06 DIAGNOSIS — I959 Hypotension, unspecified: Secondary | ICD-10-CM | POA: Diagnosis not present

## 2023-07-06 DIAGNOSIS — G8929 Other chronic pain: Secondary | ICD-10-CM | POA: Diagnosis not present

## 2023-07-06 DIAGNOSIS — T402X5D Adverse effect of other opioids, subsequent encounter: Secondary | ICD-10-CM | POA: Diagnosis not present

## 2023-07-06 DIAGNOSIS — I5022 Chronic systolic (congestive) heart failure: Secondary | ICD-10-CM | POA: Diagnosis not present

## 2023-07-07 DIAGNOSIS — K2270 Barrett's esophagus without dysplasia: Secondary | ICD-10-CM | POA: Diagnosis not present

## 2023-07-07 DIAGNOSIS — D6959 Other secondary thrombocytopenia: Secondary | ICD-10-CM | POA: Diagnosis not present

## 2023-07-07 DIAGNOSIS — I110 Hypertensive heart disease with heart failure: Secondary | ICD-10-CM | POA: Diagnosis not present

## 2023-07-07 DIAGNOSIS — E785 Hyperlipidemia, unspecified: Secondary | ICD-10-CM | POA: Diagnosis not present

## 2023-07-07 DIAGNOSIS — I5022 Chronic systolic (congestive) heart failure: Secondary | ICD-10-CM | POA: Diagnosis not present

## 2023-07-07 DIAGNOSIS — D649 Anemia, unspecified: Secondary | ICD-10-CM | POA: Diagnosis not present

## 2023-07-07 DIAGNOSIS — G8929 Other chronic pain: Secondary | ICD-10-CM | POA: Diagnosis not present

## 2023-07-07 DIAGNOSIS — E871 Hypo-osmolality and hyponatremia: Secondary | ICD-10-CM | POA: Diagnosis not present

## 2023-07-07 DIAGNOSIS — E876 Hypokalemia: Secondary | ICD-10-CM | POA: Diagnosis not present

## 2023-07-07 DIAGNOSIS — K5903 Drug induced constipation: Secondary | ICD-10-CM | POA: Diagnosis not present

## 2023-07-07 DIAGNOSIS — I700 Atherosclerosis of aorta: Secondary | ICD-10-CM | POA: Diagnosis not present

## 2023-07-07 DIAGNOSIS — R5381 Other malaise: Secondary | ICD-10-CM | POA: Diagnosis not present

## 2023-07-07 DIAGNOSIS — I456 Pre-excitation syndrome: Secondary | ICD-10-CM | POA: Diagnosis not present

## 2023-07-07 DIAGNOSIS — K567 Ileus, unspecified: Secondary | ICD-10-CM | POA: Diagnosis not present

## 2023-07-07 DIAGNOSIS — T402X5D Adverse effect of other opioids, subsequent encounter: Secondary | ICD-10-CM | POA: Diagnosis not present

## 2023-07-07 DIAGNOSIS — I959 Hypotension, unspecified: Secondary | ICD-10-CM | POA: Diagnosis not present

## 2023-07-07 DIAGNOSIS — K766 Portal hypertension: Secondary | ICD-10-CM | POA: Diagnosis not present

## 2023-07-07 DIAGNOSIS — K219 Gastro-esophageal reflux disease without esophagitis: Secondary | ICD-10-CM | POA: Diagnosis not present

## 2023-07-07 NOTE — Telephone Encounter (Signed)
Spoke with pt and he stated that he completed the labs on Friday at [facility]. Looked and the labs he had done was from his PCP. Advised the pt to have [facility]' labs done and he stated that he would go today as well as bring a list of his meds by here.

## 2023-07-07 NOTE — Telephone Encounter (Signed)
noted 

## 2023-07-08 DIAGNOSIS — D6959 Other secondary thrombocytopenia: Secondary | ICD-10-CM | POA: Diagnosis not present

## 2023-07-08 DIAGNOSIS — G8929 Other chronic pain: Secondary | ICD-10-CM | POA: Diagnosis not present

## 2023-07-08 DIAGNOSIS — I5022 Chronic systolic (congestive) heart failure: Secondary | ICD-10-CM | POA: Diagnosis not present

## 2023-07-08 DIAGNOSIS — K2270 Barrett's esophagus without dysplasia: Secondary | ICD-10-CM | POA: Diagnosis not present

## 2023-07-08 DIAGNOSIS — K766 Portal hypertension: Secondary | ICD-10-CM | POA: Diagnosis not present

## 2023-07-08 DIAGNOSIS — R5381 Other malaise: Secondary | ICD-10-CM | POA: Diagnosis not present

## 2023-07-08 DIAGNOSIS — K567 Ileus, unspecified: Secondary | ICD-10-CM | POA: Diagnosis not present

## 2023-07-08 DIAGNOSIS — I959 Hypotension, unspecified: Secondary | ICD-10-CM | POA: Diagnosis not present

## 2023-07-08 DIAGNOSIS — D649 Anemia, unspecified: Secondary | ICD-10-CM | POA: Diagnosis not present

## 2023-07-08 DIAGNOSIS — E876 Hypokalemia: Secondary | ICD-10-CM | POA: Diagnosis not present

## 2023-07-08 DIAGNOSIS — E785 Hyperlipidemia, unspecified: Secondary | ICD-10-CM | POA: Diagnosis not present

## 2023-07-08 DIAGNOSIS — K219 Gastro-esophageal reflux disease without esophagitis: Secondary | ICD-10-CM | POA: Diagnosis not present

## 2023-07-08 DIAGNOSIS — I456 Pre-excitation syndrome: Secondary | ICD-10-CM | POA: Diagnosis not present

## 2023-07-08 DIAGNOSIS — E871 Hypo-osmolality and hyponatremia: Secondary | ICD-10-CM | POA: Diagnosis not present

## 2023-07-08 DIAGNOSIS — I110 Hypertensive heart disease with heart failure: Secondary | ICD-10-CM | POA: Diagnosis not present

## 2023-07-08 DIAGNOSIS — I700 Atherosclerosis of aorta: Secondary | ICD-10-CM | POA: Diagnosis not present

## 2023-07-08 DIAGNOSIS — K5903 Drug induced constipation: Secondary | ICD-10-CM | POA: Diagnosis not present

## 2023-07-08 DIAGNOSIS — T402X5D Adverse effect of other opioids, subsequent encounter: Secondary | ICD-10-CM | POA: Diagnosis not present

## 2023-08-03 DIAGNOSIS — I110 Hypertensive heart disease with heart failure: Secondary | ICD-10-CM | POA: Diagnosis not present

## 2023-08-03 DIAGNOSIS — K766 Portal hypertension: Secondary | ICD-10-CM | POA: Diagnosis not present

## 2023-08-03 DIAGNOSIS — K0889 Other specified disorders of teeth and supporting structures: Secondary | ICD-10-CM | POA: Diagnosis not present

## 2023-08-03 DIAGNOSIS — I872 Venous insufficiency (chronic) (peripheral): Secondary | ICD-10-CM | POA: Diagnosis not present

## 2023-08-03 DIAGNOSIS — I5042 Chronic combined systolic (congestive) and diastolic (congestive) heart failure: Secondary | ICD-10-CM | POA: Diagnosis not present

## 2023-08-03 DIAGNOSIS — I10 Essential (primary) hypertension: Secondary | ICD-10-CM | POA: Diagnosis not present

## 2023-08-03 DIAGNOSIS — M4722 Other spondylosis with radiculopathy, cervical region: Secondary | ICD-10-CM | POA: Diagnosis not present

## 2023-08-05 DIAGNOSIS — C44311 Basal cell carcinoma of skin of nose: Secondary | ICD-10-CM | POA: Diagnosis not present

## 2023-08-05 DIAGNOSIS — C44319 Basal cell carcinoma of skin of other parts of face: Secondary | ICD-10-CM | POA: Diagnosis not present

## 2023-08-19 DIAGNOSIS — C44311 Basal cell carcinoma of skin of nose: Secondary | ICD-10-CM | POA: Diagnosis not present

## 2023-08-28 DIAGNOSIS — K625 Hemorrhage of anus and rectum: Secondary | ICD-10-CM | POA: Diagnosis not present

## 2023-08-28 DIAGNOSIS — G894 Chronic pain syndrome: Secondary | ICD-10-CM | POA: Diagnosis not present

## 2023-08-28 DIAGNOSIS — K219 Gastro-esophageal reflux disease without esophagitis: Secondary | ICD-10-CM | POA: Diagnosis not present

## 2023-08-28 DIAGNOSIS — K766 Portal hypertension: Secondary | ICD-10-CM | POA: Diagnosis not present

## 2023-08-28 DIAGNOSIS — M4722 Other spondylosis with radiculopathy, cervical region: Secondary | ICD-10-CM | POA: Diagnosis not present

## 2023-08-28 DIAGNOSIS — K2270 Barrett's esophagus without dysplasia: Secondary | ICD-10-CM | POA: Diagnosis not present

## 2023-08-29 LAB — CBC WITH DIFFERENTIAL/PLATELET
Basophils Absolute: 0.1 10*3/uL (ref 0.0–0.2)
Basos: 1 %
EOS (ABSOLUTE): 0.1 10*3/uL (ref 0.0–0.4)
Eos: 2 %
Hematocrit: 37.4 % — ABNORMAL LOW (ref 37.5–51.0)
Hemoglobin: 12.7 g/dL — ABNORMAL LOW (ref 13.0–17.7)
Immature Grans (Abs): 0 10*3/uL (ref 0.0–0.1)
Immature Granulocytes: 0 %
Lymphocytes Absolute: 1.8 10*3/uL (ref 0.7–3.1)
Lymphs: 32 %
MCH: 27.7 pg (ref 26.6–33.0)
MCHC: 34 g/dL (ref 31.5–35.7)
MCV: 82 fL (ref 79–97)
Monocytes Absolute: 0.5 10*3/uL (ref 0.1–0.9)
Monocytes: 9 %
Neutrophils Absolute: 3.3 10*3/uL (ref 1.4–7.0)
Neutrophils: 56 %
Platelets: 201 10*3/uL (ref 150–450)
RBC: 4.59 x10E6/uL (ref 4.14–5.80)
RDW: 13.5 % (ref 11.6–15.4)
WBC: 5.8 10*3/uL (ref 3.4–10.8)

## 2023-08-29 LAB — HEPATITIS B SURFACE ANTIBODY,QUALITATIVE: Hep B Surface Ab, Qual: REACTIVE

## 2023-08-29 LAB — COMPREHENSIVE METABOLIC PANEL
ALT: 12 [IU]/L (ref 0–44)
AST: 23 [IU]/L (ref 0–40)
Albumin: 3.7 g/dL — ABNORMAL LOW (ref 3.8–4.9)
Alkaline Phosphatase: 143 [IU]/L — ABNORMAL HIGH (ref 44–121)
BUN/Creatinine Ratio: 14 (ref 9–20)
BUN: 14 mg/dL (ref 6–24)
Bilirubin Total: 0.8 mg/dL (ref 0.0–1.2)
CO2: 21 mmol/L (ref 20–29)
Calcium: 9.9 mg/dL (ref 8.7–10.2)
Chloride: 98 mmol/L (ref 96–106)
Creatinine, Ser: 0.97 mg/dL (ref 0.76–1.27)
Globulin, Total: 2.9 g/dL (ref 1.5–4.5)
Glucose: 87 mg/dL (ref 70–99)
Potassium: 3.7 mmol/L (ref 3.5–5.2)
Sodium: 136 mmol/L (ref 134–144)
Total Protein: 6.6 g/dL (ref 6.0–8.5)
eGFR: 93 mL/min/{1.73_m2} (ref >59)

## 2023-08-29 LAB — PROTIME-INR
INR: 1.2 (ref 0.9–1.2)
Prothrombin Time: 12.8 s — ABNORMAL HIGH (ref 9.1–12.0)

## 2023-08-29 LAB — HEPATITIS A ANTIBODY, TOTAL: hep A Total Ab: NEGATIVE

## 2023-08-29 LAB — IRON,TIBC AND FERRITIN PANEL
Ferritin: 63 ng/mL (ref 30–400)
Iron Saturation: 19 % (ref 15–55)
Iron: 68 ug/dL (ref 38–169)
Total Iron Binding Capacity: 352 ug/dL (ref 250–450)
UIBC: 284 ug/dL (ref 111–343)

## 2023-08-29 LAB — LIPASE: Lipase: 32 U/L (ref 13–78)

## 2023-08-29 LAB — FOLATE: Folate: 9 ng/mL (ref >3.0)

## 2023-08-31 DIAGNOSIS — C44319 Basal cell carcinoma of skin of other parts of face: Secondary | ICD-10-CM | POA: Diagnosis not present

## 2023-09-29 DIAGNOSIS — Z0001 Encounter for general adult medical examination with abnormal findings: Secondary | ICD-10-CM | POA: Diagnosis not present

## 2023-09-29 DIAGNOSIS — Z5982 Transportation insecurity: Secondary | ICD-10-CM | POA: Diagnosis not present

## 2023-09-29 DIAGNOSIS — K219 Gastro-esophageal reflux disease without esophagitis: Secondary | ICD-10-CM | POA: Diagnosis not present

## 2023-09-29 DIAGNOSIS — M4722 Other spondylosis with radiculopathy, cervical region: Secondary | ICD-10-CM | POA: Diagnosis not present

## 2023-09-29 DIAGNOSIS — I110 Hypertensive heart disease with heart failure: Secondary | ICD-10-CM | POA: Diagnosis not present

## 2023-09-29 DIAGNOSIS — K766 Portal hypertension: Secondary | ICD-10-CM | POA: Diagnosis not present

## 2023-09-29 DIAGNOSIS — Z5986 Financial insecurity: Secondary | ICD-10-CM | POA: Diagnosis not present

## 2023-09-29 DIAGNOSIS — I5042 Chronic combined systolic (congestive) and diastolic (congestive) heart failure: Secondary | ICD-10-CM | POA: Diagnosis not present

## 2023-09-29 DIAGNOSIS — I10 Essential (primary) hypertension: Secondary | ICD-10-CM | POA: Diagnosis not present

## 2023-10-27 ENCOUNTER — Encounter: Payer: Self-pay | Admitting: Internal Medicine

## 2023-10-28 DIAGNOSIS — M4722 Other spondylosis with radiculopathy, cervical region: Secondary | ICD-10-CM | POA: Diagnosis not present

## 2023-10-28 DIAGNOSIS — I5042 Chronic combined systolic (congestive) and diastolic (congestive) heart failure: Secondary | ICD-10-CM | POA: Diagnosis not present

## 2023-10-28 DIAGNOSIS — I110 Hypertensive heart disease with heart failure: Secondary | ICD-10-CM | POA: Diagnosis not present

## 2023-11-03 DIAGNOSIS — Z0001 Encounter for general adult medical examination with abnormal findings: Secondary | ICD-10-CM | POA: Diagnosis not present

## 2023-11-03 DIAGNOSIS — D509 Iron deficiency anemia, unspecified: Secondary | ICD-10-CM | POA: Diagnosis not present

## 2023-11-03 DIAGNOSIS — I10 Essential (primary) hypertension: Secondary | ICD-10-CM | POA: Diagnosis not present

## 2023-11-03 DIAGNOSIS — I110 Hypertensive heart disease with heart failure: Secondary | ICD-10-CM | POA: Diagnosis not present

## 2023-11-20 ENCOUNTER — Encounter: Payer: Self-pay | Admitting: *Deleted

## 2023-11-27 DIAGNOSIS — K766 Portal hypertension: Secondary | ICD-10-CM | POA: Diagnosis not present

## 2023-11-27 DIAGNOSIS — I110 Hypertensive heart disease with heart failure: Secondary | ICD-10-CM | POA: Diagnosis not present

## 2023-12-30 DIAGNOSIS — M4722 Other spondylosis with radiculopathy, cervical region: Secondary | ICD-10-CM | POA: Diagnosis not present

## 2023-12-30 DIAGNOSIS — R002 Palpitations: Secondary | ICD-10-CM | POA: Diagnosis not present

## 2023-12-30 DIAGNOSIS — I456 Pre-excitation syndrome: Secondary | ICD-10-CM | POA: Diagnosis not present

## 2023-12-30 DIAGNOSIS — I491 Atrial premature depolarization: Secondary | ICD-10-CM | POA: Diagnosis not present

## 2023-12-30 DIAGNOSIS — I110 Hypertensive heart disease with heart failure: Secondary | ICD-10-CM | POA: Diagnosis not present

## 2023-12-30 DIAGNOSIS — I10 Essential (primary) hypertension: Secondary | ICD-10-CM | POA: Diagnosis not present

## 2023-12-30 DIAGNOSIS — K766 Portal hypertension: Secondary | ICD-10-CM | POA: Diagnosis not present

## 2023-12-30 DIAGNOSIS — I5042 Chronic combined systolic (congestive) and diastolic (congestive) heart failure: Secondary | ICD-10-CM | POA: Diagnosis not present

## 2023-12-30 DIAGNOSIS — K219 Gastro-esophageal reflux disease without esophagitis: Secondary | ICD-10-CM | POA: Diagnosis not present

## 2024-01-26 ENCOUNTER — Encounter: Payer: Self-pay | Admitting: Cardiology

## 2024-01-28 DIAGNOSIS — I110 Hypertensive heart disease with heart failure: Secondary | ICD-10-CM | POA: Diagnosis not present

## 2024-01-28 DIAGNOSIS — M4722 Other spondylosis with radiculopathy, cervical region: Secondary | ICD-10-CM | POA: Diagnosis not present

## 2024-01-28 DIAGNOSIS — I5042 Chronic combined systolic (congestive) and diastolic (congestive) heart failure: Secondary | ICD-10-CM | POA: Diagnosis not present

## 2024-01-28 DIAGNOSIS — K766 Portal hypertension: Secondary | ICD-10-CM | POA: Diagnosis not present

## 2024-02-17 DIAGNOSIS — I110 Hypertensive heart disease with heart failure: Secondary | ICD-10-CM | POA: Diagnosis not present

## 2024-02-17 DIAGNOSIS — M4722 Other spondylosis with radiculopathy, cervical region: Secondary | ICD-10-CM | POA: Diagnosis not present

## 2024-02-17 DIAGNOSIS — K766 Portal hypertension: Secondary | ICD-10-CM | POA: Diagnosis not present

## 2024-02-17 DIAGNOSIS — I10 Essential (primary) hypertension: Secondary | ICD-10-CM | POA: Diagnosis not present

## 2024-03-24 DIAGNOSIS — K219 Gastro-esophageal reflux disease without esophagitis: Secondary | ICD-10-CM | POA: Diagnosis not present

## 2024-03-24 DIAGNOSIS — I110 Hypertensive heart disease with heart failure: Secondary | ICD-10-CM | POA: Diagnosis not present

## 2024-03-24 DIAGNOSIS — G894 Chronic pain syndrome: Secondary | ICD-10-CM | POA: Diagnosis not present

## 2024-03-24 DIAGNOSIS — K766 Portal hypertension: Secondary | ICD-10-CM | POA: Diagnosis not present

## 2024-03-24 DIAGNOSIS — I5042 Chronic combined systolic (congestive) and diastolic (congestive) heart failure: Secondary | ICD-10-CM | POA: Diagnosis not present

## 2024-03-24 DIAGNOSIS — I10 Essential (primary) hypertension: Secondary | ICD-10-CM | POA: Diagnosis not present

## 2024-03-24 DIAGNOSIS — M4722 Other spondylosis with radiculopathy, cervical region: Secondary | ICD-10-CM | POA: Diagnosis not present

## 2024-03-24 DIAGNOSIS — E349 Endocrine disorder, unspecified: Secondary | ICD-10-CM | POA: Diagnosis not present

## 2024-03-24 DIAGNOSIS — I456 Pre-excitation syndrome: Secondary | ICD-10-CM | POA: Diagnosis not present

## 2024-04-20 DIAGNOSIS — K219 Gastro-esophageal reflux disease without esophagitis: Secondary | ICD-10-CM | POA: Diagnosis not present

## 2024-04-20 DIAGNOSIS — G894 Chronic pain syndrome: Secondary | ICD-10-CM | POA: Diagnosis not present

## 2024-04-20 DIAGNOSIS — I110 Hypertensive heart disease with heart failure: Secondary | ICD-10-CM | POA: Diagnosis not present

## 2024-06-02 DIAGNOSIS — M545 Low back pain, unspecified: Secondary | ICD-10-CM | POA: Diagnosis not present

## 2024-06-02 DIAGNOSIS — M25511 Pain in right shoulder: Secondary | ICD-10-CM | POA: Diagnosis not present

## 2024-06-02 DIAGNOSIS — G8929 Other chronic pain: Secondary | ICD-10-CM | POA: Diagnosis not present

## 2024-06-02 DIAGNOSIS — R202 Paresthesia of skin: Secondary | ICD-10-CM | POA: Diagnosis not present

## 2024-07-03 ENCOUNTER — Inpatient Hospital Stay (HOSPITAL_COMMUNITY)
Admission: EM | Admit: 2024-07-03 | Discharge: 2024-07-08 | DRG: 483 | Disposition: A | Discharge status: Home Health | Attending: Internal Medicine | Admitting: Internal Medicine

## 2024-07-03 ENCOUNTER — Encounter (HOSPITAL_COMMUNITY): Payer: Self-pay

## 2024-07-03 ENCOUNTER — Other Ambulatory Visit: Payer: Self-pay

## 2024-07-03 ENCOUNTER — Emergency Department (HOSPITAL_COMMUNITY)

## 2024-07-03 DIAGNOSIS — N179 Acute kidney failure, unspecified: Secondary | ICD-10-CM | POA: Diagnosis present

## 2024-07-03 DIAGNOSIS — K7682 Hepatic encephalopathy: Secondary | ICD-10-CM | POA: Diagnosis present

## 2024-07-03 DIAGNOSIS — E869 Volume depletion, unspecified: Secondary | ICD-10-CM | POA: Diagnosis present

## 2024-07-03 DIAGNOSIS — G894 Chronic pain syndrome: Secondary | ICD-10-CM | POA: Diagnosis present

## 2024-07-03 DIAGNOSIS — I110 Hypertensive heart disease with heart failure: Secondary | ICD-10-CM | POA: Diagnosis present

## 2024-07-03 DIAGNOSIS — Z79899 Other long term (current) drug therapy: Secondary | ICD-10-CM

## 2024-07-03 DIAGNOSIS — I2510 Atherosclerotic heart disease of native coronary artery without angina pectoris: Secondary | ICD-10-CM | POA: Diagnosis present

## 2024-07-03 DIAGNOSIS — F1120 Opioid dependence, uncomplicated: Secondary | ICD-10-CM | POA: Diagnosis present

## 2024-07-03 DIAGNOSIS — S42351A Displaced comminuted fracture of shaft of humerus, right arm, initial encounter for closed fracture: Principal | ICD-10-CM | POA: Diagnosis present

## 2024-07-03 DIAGNOSIS — K760 Fatty (change of) liver, not elsewhere classified: Secondary | ICD-10-CM | POA: Diagnosis present

## 2024-07-03 DIAGNOSIS — Z800 Family history of malignant neoplasm of digestive organs: Secondary | ICD-10-CM

## 2024-07-03 DIAGNOSIS — F17210 Nicotine dependence, cigarettes, uncomplicated: Secondary | ICD-10-CM | POA: Diagnosis present

## 2024-07-03 DIAGNOSIS — F1010 Alcohol abuse, uncomplicated: Secondary | ICD-10-CM | POA: Diagnosis present

## 2024-07-03 DIAGNOSIS — K7030 Alcoholic cirrhosis of liver without ascites: Secondary | ICD-10-CM | POA: Diagnosis present

## 2024-07-03 DIAGNOSIS — Y92008 Other place in unspecified non-institutional (private) residence as the place of occurrence of the external cause: Secondary | ICD-10-CM

## 2024-07-03 DIAGNOSIS — I10 Essential (primary) hypertension: Secondary | ICD-10-CM | POA: Diagnosis present

## 2024-07-03 DIAGNOSIS — Z803 Family history of malignant neoplasm of breast: Secondary | ICD-10-CM

## 2024-07-03 DIAGNOSIS — R52 Pain, unspecified: Secondary | ICD-10-CM

## 2024-07-03 DIAGNOSIS — Z8249 Family history of ischemic heart disease and other diseases of the circulatory system: Secondary | ICD-10-CM

## 2024-07-03 DIAGNOSIS — E785 Hyperlipidemia, unspecified: Secondary | ICD-10-CM | POA: Diagnosis present

## 2024-07-03 DIAGNOSIS — Z560 Unemployment, unspecified: Secondary | ICD-10-CM

## 2024-07-03 DIAGNOSIS — E871 Hypo-osmolality and hyponatremia: Secondary | ICD-10-CM | POA: Diagnosis present

## 2024-07-03 DIAGNOSIS — R112 Nausea with vomiting, unspecified: Secondary | ICD-10-CM | POA: Diagnosis present

## 2024-07-03 DIAGNOSIS — K2100 Gastro-esophageal reflux disease with esophagitis, without bleeding: Secondary | ICD-10-CM | POA: Diagnosis present

## 2024-07-03 DIAGNOSIS — I5042 Chronic combined systolic (congestive) and diastolic (congestive) heart failure: Secondary | ICD-10-CM | POA: Diagnosis present

## 2024-07-03 DIAGNOSIS — I428 Other cardiomyopathies: Secondary | ICD-10-CM | POA: Diagnosis present

## 2024-07-03 DIAGNOSIS — E876 Hypokalemia: Secondary | ICD-10-CM | POA: Diagnosis present

## 2024-07-03 DIAGNOSIS — G9341 Metabolic encephalopathy: Secondary | ICD-10-CM | POA: Diagnosis present

## 2024-07-03 DIAGNOSIS — S43004A Unspecified dislocation of right shoulder joint, initial encounter: Secondary | ICD-10-CM | POA: Diagnosis present

## 2024-07-03 DIAGNOSIS — W010XXA Fall on same level from slipping, tripping and stumbling without subsequent striking against object, initial encounter: Secondary | ICD-10-CM | POA: Diagnosis present

## 2024-07-03 MED ORDER — LACTATED RINGERS IV BOLUS
1000.0000 mL | Freq: Once | INTRAVENOUS | Status: AC
  Administered 2024-07-03: 1000 mL via INTRAVENOUS

## 2024-07-03 MED ORDER — HYDROMORPHONE HCL 1 MG/ML IJ SOLN
1.0000 mg | Freq: Once | INTRAMUSCULAR | Status: AC
  Administered 2024-07-03: 1 mg via INTRAVENOUS
  Filled 2024-07-03: qty 1

## 2024-07-03 MED ORDER — ONDANSETRON HCL 4 MG/2ML IJ SOLN
4.0000 mg | Freq: Once | INTRAMUSCULAR | Status: AC
  Administered 2024-07-03: 4 mg via INTRAVENOUS
  Filled 2024-07-03: qty 2

## 2024-07-03 NOTE — ED Triage Notes (Signed)
 Pt to ED via EMS from home with c/o right arm pain, pt fell a week ago, entire right arm bruised in various stages, EMS report pt had couple of syncopal episodes last week, pt is currently sinus tach with c/o sob, arm pain, and chest pain.

## 2024-07-04 ENCOUNTER — Inpatient Hospital Stay (HOSPITAL_COMMUNITY)

## 2024-07-04 ENCOUNTER — Other Ambulatory Visit (HOSPITAL_COMMUNITY): Payer: Self-pay | Admitting: *Deleted

## 2024-07-04 ENCOUNTER — Inpatient Hospital Stay (HOSPITAL_COMMUNITY)
Admit: 2024-07-04 | Discharge: 2024-07-04 | Disposition: A | Discharge status: Home / Self Care | Attending: Internal Medicine | Admitting: Internal Medicine

## 2024-07-04 ENCOUNTER — Emergency Department (HOSPITAL_COMMUNITY)

## 2024-07-04 DIAGNOSIS — E869 Volume depletion, unspecified: Secondary | ICD-10-CM | POA: Diagnosis present

## 2024-07-04 DIAGNOSIS — I5042 Chronic combined systolic (congestive) and diastolic (congestive) heart failure: Secondary | ICD-10-CM | POA: Diagnosis present

## 2024-07-04 DIAGNOSIS — K760 Fatty (change of) liver, not elsewhere classified: Secondary | ICD-10-CM | POA: Diagnosis present

## 2024-07-04 DIAGNOSIS — N179 Acute kidney failure, unspecified: Secondary | ICD-10-CM | POA: Diagnosis present

## 2024-07-04 DIAGNOSIS — G9341 Metabolic encephalopathy: Secondary | ICD-10-CM | POA: Diagnosis present

## 2024-07-04 DIAGNOSIS — Y92008 Other place in unspecified non-institutional (private) residence as the place of occurrence of the external cause: Secondary | ICD-10-CM | POA: Diagnosis not present

## 2024-07-04 DIAGNOSIS — G894 Chronic pain syndrome: Secondary | ICD-10-CM | POA: Diagnosis present

## 2024-07-04 DIAGNOSIS — E785 Hyperlipidemia, unspecified: Secondary | ICD-10-CM | POA: Diagnosis present

## 2024-07-04 DIAGNOSIS — S42351A Displaced comminuted fracture of shaft of humerus, right arm, initial encounter for closed fracture: Secondary | ICD-10-CM | POA: Diagnosis present

## 2024-07-04 DIAGNOSIS — I2510 Atherosclerotic heart disease of native coronary artery without angina pectoris: Secondary | ICD-10-CM | POA: Diagnosis present

## 2024-07-04 DIAGNOSIS — R079 Chest pain, unspecified: Secondary | ICD-10-CM

## 2024-07-04 DIAGNOSIS — R1110 Vomiting, unspecified: Secondary | ICD-10-CM | POA: Diagnosis not present

## 2024-07-04 DIAGNOSIS — R112 Nausea with vomiting, unspecified: Secondary | ICD-10-CM | POA: Diagnosis not present

## 2024-07-04 DIAGNOSIS — F1090 Alcohol use, unspecified, uncomplicated: Secondary | ICD-10-CM | POA: Diagnosis not present

## 2024-07-04 DIAGNOSIS — F1010 Alcohol abuse, uncomplicated: Secondary | ICD-10-CM

## 2024-07-04 DIAGNOSIS — K7682 Hepatic encephalopathy: Secondary | ICD-10-CM | POA: Diagnosis present

## 2024-07-04 DIAGNOSIS — Z800 Family history of malignant neoplasm of digestive organs: Secondary | ICD-10-CM | POA: Diagnosis not present

## 2024-07-04 DIAGNOSIS — R52 Pain, unspecified: Secondary | ICD-10-CM | POA: Diagnosis not present

## 2024-07-04 DIAGNOSIS — K7030 Alcoholic cirrhosis of liver without ascites: Secondary | ICD-10-CM | POA: Diagnosis present

## 2024-07-04 DIAGNOSIS — I110 Hypertensive heart disease with heart failure: Secondary | ICD-10-CM | POA: Diagnosis present

## 2024-07-04 DIAGNOSIS — F1120 Opioid dependence, uncomplicated: Secondary | ICD-10-CM | POA: Diagnosis present

## 2024-07-04 DIAGNOSIS — E871 Hypo-osmolality and hyponatremia: Secondary | ICD-10-CM | POA: Diagnosis present

## 2024-07-04 DIAGNOSIS — I428 Other cardiomyopathies: Secondary | ICD-10-CM | POA: Diagnosis present

## 2024-07-04 DIAGNOSIS — R569 Unspecified convulsions: Secondary | ICD-10-CM | POA: Diagnosis not present

## 2024-07-04 DIAGNOSIS — Z803 Family history of malignant neoplasm of breast: Secondary | ICD-10-CM | POA: Diagnosis not present

## 2024-07-04 DIAGNOSIS — R4182 Altered mental status, unspecified: Secondary | ICD-10-CM | POA: Diagnosis not present

## 2024-07-04 DIAGNOSIS — S42351K Displaced comminuted fracture of shaft of humerus, right arm, subsequent encounter for fracture with nonunion: Secondary | ICD-10-CM | POA: Diagnosis not present

## 2024-07-04 DIAGNOSIS — I10 Essential (primary) hypertension: Secondary | ICD-10-CM | POA: Diagnosis not present

## 2024-07-04 DIAGNOSIS — W010XXA Fall on same level from slipping, tripping and stumbling without subsequent striking against object, initial encounter: Secondary | ICD-10-CM | POA: Diagnosis present

## 2024-07-04 DIAGNOSIS — F17210 Nicotine dependence, cigarettes, uncomplicated: Secondary | ICD-10-CM | POA: Diagnosis present

## 2024-07-04 DIAGNOSIS — K2100 Gastro-esophageal reflux disease with esophagitis, without bleeding: Secondary | ICD-10-CM | POA: Diagnosis present

## 2024-07-04 DIAGNOSIS — Z8249 Family history of ischemic heart disease and other diseases of the circulatory system: Secondary | ICD-10-CM | POA: Diagnosis not present

## 2024-07-04 DIAGNOSIS — E876 Hypokalemia: Secondary | ICD-10-CM | POA: Diagnosis present

## 2024-07-04 DIAGNOSIS — S42201A Unspecified fracture of upper end of right humerus, initial encounter for closed fracture: Secondary | ICD-10-CM | POA: Diagnosis not present

## 2024-07-04 DIAGNOSIS — S43004A Unspecified dislocation of right shoulder joint, initial encounter: Secondary | ICD-10-CM | POA: Diagnosis present

## 2024-07-04 LAB — T4, FREE: Free T4: 1.14 ng/dL — ABNORMAL HIGH (ref 0.61–1.12)

## 2024-07-04 LAB — CBC WITH DIFFERENTIAL/PLATELET
Abs Immature Granulocytes: 0.05 K/uL (ref 0.00–0.07)
Basophils Absolute: 0.1 K/uL (ref 0.0–0.1)
Basophils Relative: 1 %
Eosinophils Absolute: 0 K/uL (ref 0.0–0.5)
Eosinophils Relative: 0 %
HCT: 35.8 % — ABNORMAL LOW (ref 39.0–52.0)
Hemoglobin: 12 g/dL — ABNORMAL LOW (ref 13.0–17.0)
Immature Granulocytes: 1 %
Lymphocytes Relative: 12 %
Lymphs Abs: 1.3 K/uL (ref 0.7–4.0)
MCH: 28 pg (ref 26.0–34.0)
MCHC: 33.5 g/dL (ref 30.0–36.0)
MCV: 83.4 fL (ref 80.0–100.0)
Monocytes Absolute: 1.7 K/uL — ABNORMAL HIGH (ref 0.1–1.0)
Monocytes Relative: 17 %
Neutro Abs: 7.1 K/uL (ref 1.7–7.7)
Neutrophils Relative %: 69 %
Platelets: 274 K/uL (ref 150–400)
RBC: 4.29 MIL/uL (ref 4.22–5.81)
RDW: 17.1 % — ABNORMAL HIGH (ref 11.5–15.5)
WBC: 10.3 K/uL (ref 4.0–10.5)
nRBC: 0 % (ref 0.0–0.2)

## 2024-07-04 LAB — COMPREHENSIVE METABOLIC PANEL WITH GFR
ALT: 33 U/L (ref 0–44)
AST: 82 U/L — ABNORMAL HIGH (ref 15–41)
Albumin: 4 g/dL (ref 3.5–5.0)
Alkaline Phosphatase: 166 U/L — ABNORMAL HIGH (ref 38–126)
Anion gap: 18 — ABNORMAL HIGH (ref 5–15)
BUN: 16 mg/dL (ref 6–20)
CO2: 22 mmol/L (ref 22–32)
Calcium: 8.5 mg/dL — ABNORMAL LOW (ref 8.9–10.3)
Chloride: 93 mmol/L — ABNORMAL LOW (ref 98–111)
Creatinine, Ser: 1.16 mg/dL (ref 0.61–1.24)
GFR, Estimated: >60 mL/min (ref >60)
Glucose, Bld: 128 mg/dL — ABNORMAL HIGH (ref 70–99)
Potassium: 3.6 mmol/L (ref 3.5–5.1)
Sodium: 133 mmol/L — ABNORMAL LOW (ref 135–145)
Total Bilirubin: 1.9 mg/dL — ABNORMAL HIGH (ref 0.0–1.2)
Total Protein: 7.7 g/dL (ref 6.5–8.1)

## 2024-07-04 LAB — URINALYSIS, W/ REFLEX TO CULTURE (INFECTION SUSPECTED)
Bacteria, UA: NONE SEEN
Bilirubin Urine: NEGATIVE
Glucose, UA: NEGATIVE mg/dL
Ketones, ur: NEGATIVE mg/dL
Leukocytes,Ua: NEGATIVE
Nitrite: NEGATIVE
Protein, ur: 100 mg/dL — AB
Specific Gravity, Urine: 1.023 (ref 1.005–1.030)
pH: 6 (ref 5.0–8.0)

## 2024-07-04 LAB — VITAMIN B12: Vitamin B-12: 631 pg/mL (ref 180–914)

## 2024-07-04 LAB — CK: Total CK: 255 U/L (ref 49–397)

## 2024-07-04 LAB — HIV ANTIBODY (ROUTINE TESTING W REFLEX): HIV Screen 4th Generation wRfx: NONREACTIVE

## 2024-07-04 LAB — URINE DRUG SCREEN
Amphetamines: NEGATIVE
Barbiturates: NEGATIVE
Benzodiazepines: NEGATIVE
Cocaine: NEGATIVE
Fentanyl: NEGATIVE
Methadone Scn, Ur: NEGATIVE
Opiates: NEGATIVE
Tetrahydrocannabinol: POSITIVE — AB

## 2024-07-04 LAB — ECHOCARDIOGRAM COMPLETE
Area-P 1/2: 10.84 cm2
Height: 72 in
S' Lateral: 3.8 cm
Weight: 3234.59 [oz_av]

## 2024-07-04 LAB — TSH: TSH: 5.29 u[IU]/mL — ABNORMAL HIGH (ref 0.350–4.500)

## 2024-07-04 LAB — TROPONIN T, HIGH SENSITIVITY
Troponin T High Sensitivity: 32 ng/L — ABNORMAL HIGH (ref 0–19)
Troponin T High Sensitivity: 33 ng/L — ABNORMAL HIGH (ref 0–19)

## 2024-07-04 LAB — D-DIMER, QUANTITATIVE: D-Dimer, Quant: 6.17 ug{FEU}/mL — ABNORMAL HIGH (ref 0.00–0.50)

## 2024-07-04 LAB — PRO BRAIN NATRIURETIC PEPTIDE: Pro Brain Natriuretic Peptide: 1272 pg/mL — ABNORMAL HIGH (ref <300.0)

## 2024-07-04 LAB — MAGNESIUM: Magnesium: 1.6 mg/dL — ABNORMAL LOW (ref 1.7–2.4)

## 2024-07-04 LAB — AMMONIA: Ammonia: 72 umol/L — ABNORMAL HIGH (ref 9–35)

## 2024-07-04 LAB — FOLATE: Folate: 4.9 ng/mL — ABNORMAL LOW

## 2024-07-04 LAB — PHOSPHORUS: Phosphorus: 2.1 mg/dL — ABNORMAL LOW (ref 2.5–4.6)

## 2024-07-04 MED ORDER — FOLIC ACID 1 MG PO TABS: 1.0000 mg | ORAL_TABLET | Freq: Every day | ORAL | Status: DC

## 2024-07-04 MED ORDER — NAPROXEN 250 MG PO TABS
500.0000 mg | ORAL_TABLET | Freq: Two times a day (BID) | ORAL | Status: DC | PRN
  Administered 2024-07-04: 500 mg via ORAL
  Filled 2024-07-04: qty 2

## 2024-07-04 MED ORDER — FOLIC ACID 1 MG PO TABS
1.0000 mg | ORAL_TABLET | Freq: Every day | ORAL | Status: DC
  Administered 2024-07-05 – 2024-07-08 (×3): 1 mg via ORAL
  Filled 2024-07-04 (×3): qty 1

## 2024-07-04 MED ORDER — CLONIDINE HCL 0.1 MG PO TABS: 0.1000 mg | ORAL_TABLET | ORAL | Status: DC

## 2024-07-04 MED ORDER — CLONIDINE HCL 0.1 MG PO TABS: 0.1000 mg | ORAL_TABLET | Freq: Every day | ORAL | Status: DC

## 2024-07-04 MED ORDER — NICOTINE 21 MG/24HR TD PT24
21.0000 mg | MEDICATED_PATCH | Freq: Every day | TRANSDERMAL | Status: DC
  Administered 2024-07-04 – 2024-07-08 (×5): 21 mg via TRANSDERMAL
  Filled 2024-07-04 (×5): qty 1

## 2024-07-04 MED ORDER — ADULT MULTIVITAMIN W/MINERALS CH
1.0000 | ORAL_TABLET | Freq: Every day | ORAL | Status: DC
  Administered 2024-07-04 – 2024-07-08 (×4): 1 via ORAL
  Filled 2024-07-04 (×4): qty 1

## 2024-07-04 MED ORDER — TRAZODONE HCL 100 MG PO TABS
100.0000 mg | ORAL_TABLET | Freq: Every day | ORAL | Status: DC
  Administered 2024-07-05 – 2024-07-07 (×4): 100 mg via ORAL
  Filled 2024-07-04 (×2): qty 1
  Filled 2024-07-04 (×2): qty 2

## 2024-07-04 MED ORDER — LACTULOSE 10 GM/15ML PO SOLN
30.0000 g | Freq: Two times a day (BID) | ORAL | Status: DC
  Administered 2024-07-04 – 2024-07-07 (×6): 30 g via ORAL
  Filled 2024-07-04 (×2): qty 60
  Filled 2024-07-04 (×2): qty 45
  Filled 2024-07-04 (×2): qty 60
  Filled 2024-07-04: qty 45

## 2024-07-04 MED ORDER — ONDANSETRON HCL 4 MG/2ML IJ SOLN
4.0000 mg | Freq: Four times a day (QID) | INTRAMUSCULAR | Status: DC | PRN
  Administered 2024-07-04: 4 mg via INTRAVENOUS
  Filled 2024-07-04: qty 2

## 2024-07-04 MED ORDER — ONDANSETRON HCL 4 MG PO TABS: 4.0000 mg | ORAL_TABLET | Freq: Four times a day (QID) | ORAL | Status: DC | PRN

## 2024-07-04 MED ORDER — LACTULOSE 10 GM/15ML PO SOLN: 30.0000 g | Freq: Two times a day (BID) | ORAL | Status: DC | PRN

## 2024-07-04 MED ORDER — PANTOPRAZOLE SODIUM 40 MG PO TBEC
40.0000 mg | DELAYED_RELEASE_TABLET | Freq: Two times a day (BID) | ORAL | Status: DC
  Administered 2024-07-05 – 2024-07-08 (×6): 40 mg via ORAL
  Filled 2024-07-04 (×6): qty 1

## 2024-07-04 MED ORDER — CARVEDILOL 3.125 MG PO TABS: 6.2500 mg | ORAL_TABLET | Freq: Two times a day (BID) | ORAL | Status: DC

## 2024-07-04 MED ORDER — CLONIDINE HCL 0.1 MG PO TABS: 0.1000 mg | ORAL_TABLET | Freq: Four times a day (QID) | ORAL | Status: DC

## 2024-07-04 MED ORDER — ACETAMINOPHEN 650 MG RE SUPP: 650.0000 mg | Freq: Four times a day (QID) | RECTAL | Status: DC | PRN

## 2024-07-04 MED ORDER — HYDROMORPHONE HCL 1 MG/ML IJ SOLN
1.0000 mg | Freq: Once | INTRAMUSCULAR | Status: AC
  Administered 2024-07-04: 1 mg via INTRAVENOUS
  Filled 2024-07-04: qty 1

## 2024-07-04 MED ORDER — LOPERAMIDE HCL 2 MG PO CAPS: 2.0000 mg | ORAL_CAPSULE | ORAL | Status: DC | PRN

## 2024-07-04 MED ORDER — LORAZEPAM 1 MG PO TABS
1.0000 mg | ORAL_TABLET | ORAL | Status: AC | PRN
  Administered 2024-07-05 – 2024-07-06 (×3): 1 mg via ORAL
  Filled 2024-07-04 (×3): qty 1

## 2024-07-04 MED ORDER — GABAPENTIN 100 MG PO CAPS
200.0000 mg | ORAL_CAPSULE | Freq: Two times a day (BID) | ORAL | Status: DC
  Administered 2024-07-04 – 2024-07-08 (×7): 200 mg via ORAL
  Filled 2024-07-04 (×7): qty 2

## 2024-07-04 MED ORDER — THIAMINE MONONITRATE 100 MG PO TABS
100.0000 mg | ORAL_TABLET | Freq: Every day | ORAL | Status: DC
  Administered 2024-07-05 – 2024-07-08 (×3): 100 mg via ORAL
  Filled 2024-07-04 (×4): qty 1

## 2024-07-04 MED ORDER — CLONIDINE HCL 0.1 MG PO TABS
0.1000 mg | ORAL_TABLET | Freq: Four times a day (QID) | ORAL | Status: DC
  Administered 2024-07-04: 0.1 mg via ORAL
  Filled 2024-07-04: qty 1

## 2024-07-04 MED ORDER — VITAMIN D 25 MCG (1000 UNIT) PO TABS
2000.0000 [IU] | ORAL_TABLET | Freq: Every evening | ORAL | Status: DC
  Administered 2024-07-04 – 2024-07-07 (×4): 2000 [IU] via ORAL
  Filled 2024-07-04 (×4): qty 2

## 2024-07-04 MED ORDER — ACETAMINOPHEN 325 MG PO TABS
650.0000 mg | ORAL_TABLET | Freq: Four times a day (QID) | ORAL | Status: DC | PRN
Start: 2024-07-04 — End: 2024-07-08
  Administered 2024-07-05: 650 mg via ORAL
  Filled 2024-07-04: qty 2

## 2024-07-04 MED ORDER — OXYCODONE HCL 5 MG PO TABS
15.0000 mg | ORAL_TABLET | Freq: Four times a day (QID) | ORAL | Status: DC | PRN
  Administered 2024-07-04 – 2024-07-07 (×10): 15 mg via ORAL
  Administered 2024-07-07: 10 mg via ORAL
  Administered 2024-07-08 (×2): 15 mg via ORAL
  Filled 2024-07-04 (×13): qty 3

## 2024-07-04 MED ORDER — DICYCLOMINE HCL 10 MG PO CAPS: 20.0000 mg | ORAL_CAPSULE | Freq: Four times a day (QID) | ORAL | Status: DC | PRN

## 2024-07-04 MED ORDER — LORAZEPAM 2 MG/ML IJ SOLN
1.0000 mg | INTRAMUSCULAR | Status: AC | PRN
  Administered 2024-07-04: 2 mg via INTRAVENOUS
  Administered 2024-07-05: 1 mg via INTRAVENOUS
  Filled 2024-07-04 (×2): qty 1

## 2024-07-04 MED ORDER — METHOCARBAMOL 500 MG PO TABS
500.0000 mg | ORAL_TABLET | Freq: Three times a day (TID) | ORAL | Status: DC | PRN
  Administered 2024-07-04 – 2024-07-08 (×5): 500 mg via ORAL
  Filled 2024-07-04 (×5): qty 1

## 2024-07-04 MED ORDER — HYDROXYZINE HCL 25 MG PO TABS
25.0000 mg | ORAL_TABLET | Freq: Three times a day (TID) | ORAL | Status: DC | PRN
  Administered 2024-07-04 – 2024-07-08 (×6): 25 mg via ORAL
  Filled 2024-07-04 (×6): qty 1

## 2024-07-04 MED ORDER — HYDROMORPHONE HCL 1 MG/ML IJ SOLN
1.0000 mg | INTRAMUSCULAR | Status: DC | PRN
  Administered 2024-07-04 – 2024-07-08 (×18): 1 mg via INTRAVENOUS
  Filled 2024-07-04 (×18): qty 1

## 2024-07-04 MED ORDER — CARVEDILOL 6.25 MG PO TABS
6.2500 mg | ORAL_TABLET | Freq: Two times a day (BID) | ORAL | Status: DC
  Administered 2024-07-04 – 2024-07-08 (×9): 6.25 mg via ORAL
  Filled 2024-07-04: qty 1
  Filled 2024-07-04: qty 2
  Filled 2024-07-04: qty 1
  Filled 2024-07-04 (×5): qty 2
  Filled 2024-07-04: qty 1

## 2024-07-04 MED ORDER — FUROSEMIDE 10 MG/ML IJ SOLN
40.0000 mg | Freq: Once | INTRAMUSCULAR | Status: AC
  Administered 2024-07-04: 40 mg via INTRAVENOUS
  Filled 2024-07-04: qty 4

## 2024-07-04 MED ORDER — ONDANSETRON 4 MG PO TBDP
4.0000 mg | ORAL_TABLET | Freq: Four times a day (QID) | ORAL | Status: DC | PRN
  Administered 2024-07-04: 4 mg via ORAL
  Filled 2024-07-04: qty 1

## 2024-07-04 MED ORDER — IOHEXOL 350 MG/ML SOLN
100.0000 mL | Freq: Once | INTRAVENOUS | Status: AC | PRN
  Administered 2024-07-04: 75 mL via INTRAVENOUS

## 2024-07-04 MED ORDER — THIAMINE HCL 100 MG/ML IJ SOLN
100.0000 mg | Freq: Every day | INTRAMUSCULAR | Status: DC
  Administered 2024-07-04 – 2024-07-07 (×2): 100 mg via INTRAVENOUS
  Filled 2024-07-04 (×3): qty 2

## 2024-07-04 MED ORDER — ALBUTEROL SULFATE (2.5 MG/3ML) 0.083% IN NEBU: 2.5000 mg | INHALATION_SOLUTION | Freq: Four times a day (QID) | RESPIRATORY_TRACT | Status: DC | PRN

## 2024-07-04 MED ORDER — THIAMINE MONONITRATE 100 MG PO TABS: 100.0000 mg | ORAL_TABLET | Freq: Every day | ORAL | Status: DC

## 2024-07-04 MED ORDER — ENOXAPARIN SODIUM 40 MG/0.4ML IJ SOSY
40.0000 mg | PREFILLED_SYRINGE | INTRAMUSCULAR | Status: DC
Start: 2024-07-04 — End: 2024-07-08
  Administered 2024-07-04 – 2024-07-06 (×3): 40 mg via SUBCUTANEOUS
  Filled 2024-07-04 (×3): qty 0.4

## 2024-07-04 MED ORDER — LABETALOL HCL 5 MG/ML IV SOLN
5.0000 mg | Freq: Once | INTRAVENOUS | Status: DC
  Filled 2024-07-04: qty 4

## 2024-07-04 MED ORDER — PERFLUTREN LIPID MICROSPHERE
1.0000 mL | INTRAVENOUS | Status: AC | PRN
  Administered 2024-07-04: 3 mL via INTRAVENOUS

## 2024-07-04 MED ORDER — HYDROXYZINE HCL 25 MG PO TABS
25.0000 mg | ORAL_TABLET | Freq: Four times a day (QID) | ORAL | Status: DC | PRN
  Administered 2024-07-04: 25 mg via ORAL
  Filled 2024-07-04: qty 1

## 2024-07-04 NOTE — Hospital Course (Addendum)
 53 year old male with history of HTN, hyperlipidemia opioid dependent, chronic HFmrEF, Barrett esophagus, PUD, WPW status post ablation, NICM, alcohol  abuse, and Etoh liver cirrhosis presenting with severe right arm pain.  The patient is a difficult historian often giving tangential answers with conflicting chronological timelines.  In general, the patient believes that he sustained a mechanical fall about 10 days prior to this admission while playing with his son.  He stated that he tripped over an ottoman and fall onto his right side.  He stated that he had some right arm pain at that time, but it had resolved after several minutes. About 2 days later, apparently the patient woke up after sleeping in his recliner.  Apparently his wife described the patient as having a night terror.  Apparently the patient was confused and fell onto his right side at that time after which he had significant arm pain.  He stated that he continued to have some intermittent arm pain throughout the week but did not think much of it. However, he noted that on 07/02/2024 there was significant swelling and edema about his right upper arm which had worsened over the next 48 hours prompting him to come to the emergency department for further evaluation. He had denied any interim syncopal episodes, but had complained of some dizziness. He states he has had some subjective fevers and chills.  As he was preparing to come to the emergency department he noticed some pins and needle sensation on his whole face bilaterally down through his neck bilaterally and down to his chest.  He had complained of sharp chest pain at that time lasting about 2 hours.  He had some shortness of breath.  At the time of my evaluation he is pain-free with regard to his chest pain and dysesthesia on his face and neck.  He denies any visual disturbance.  He denies any focal extremity weakness aside from his right arm.  He denies dysarthria.  Notably, the  patient continues to drink alcohol  at least 4 days/week.  He states that he drinks  a couple shots of vodka at each sitting.  He continues to smoke 1 pack/day x 40 years.  He denies illicit drug use. He does endorse several episodes of emesis prior to coming to the hospital without any blood.  He also endorsed having some loose stools but denied any hematochezia or melena.  He denies any abdominal pain, dysuria, hematuria.  He denies any coughing or hemoptysis. In addition, the patient states that he has been not completely compliant with his antihypertensive medications nor his lactulose .  In the ED, the patient was afebrile and hemodynamically stable with oxygen saturation 97% room air.  WBC 10.3, hemoglobin 12.0, platelet 274.  Sodium 133, potassium 3.6, bicarbonate 22, serum creatinine 1.16.  AST 82, ALT 33, alk phosphatase 166, total bilirubin 1.9.  Albumin  4.0.  Troponin 33>> 32.  CPK 215.  D-dimer 6.17. CTA chest negative for PE.  No focal consolidation or effusion on CTA chest.  X-ray of the right arm showed an acutely displaced and comminuted right humerus head and neck fracture.  EDP spoke with orthopedic surgery, Dr. Onesimo who will consult.

## 2024-07-04 NOTE — ED Notes (Signed)
 Pt has strong palpable radial pulse in R arm, skin is warm and dry. Report to floor RN, pt awaits transport.

## 2024-07-04 NOTE — ED Notes (Signed)
 Pt shirt cuf off per pt request

## 2024-07-04 NOTE — ED Notes (Signed)
 ED Provider at bedside.

## 2024-07-04 NOTE — Progress Notes (Signed)
 EEG complete, results are pending.

## 2024-07-04 NOTE — H&P (Signed)
 History and Physical    Patient: Timothy [name] FMW:[id number] DOB: 06/08/1971 DOA: 07/03/2024 DOS: the patient was seen and examined on 07/04/2024 PCP: Bertell Satterfield, MD  Patient coming from: Home  Chief Complaint:  Chief Complaint  Patient presents with   Arm Injury    Chest pain    HPI: Timothy [name] is a 53 year old male with history of HTN, hyperlipidemia opioid dependent, chronic HFmrEF, Barrett esophagus, PUD, WPW status post ablation, NICM, alcohol  abuse, and Etoh liver cirrhosis presenting with severe right arm pain.  The patient is a difficult historian often giving tangential answers with conflicting chronological timelines.  In general, the patient believes that he sustained a mechanical fall about 10 days prior to this admission while playing with his son.  He stated that he tripped over an ottoman and fall onto his right side.  He stated that he had some right arm pain at that time, but it had resolved after several minutes. About 2 days later, apparently the patient woke up after sleeping in his recliner.  Apparently his wife described the patient as having a night terror.  Apparently the patient was confused and fell onto his right side at that time after which he had significant arm pain.  He stated that he continued to have some intermittent arm pain throughout the week but did not think much of it. However, he noted that on 07/02/2024 there was significant swelling and edema about his right upper arm which had worsened over the next 48 hours prompting him to come to the emergency department for further evaluation. He had denied any interim syncopal episodes, but had complained of some dizziness. He states he has had some subjective fevers and chills.  As he was preparing to come to the emergency department he noticed some pins and needle sensation on his whole face bilaterally down through his neck bilaterally and down to his chest.  He had complained of sharp chest  pain at that time lasting about 2 hours.  He had some shortness of breath.  At the time of my evaluation he is pain-free with regard to his chest pain and dysesthesia on his face and neck.  He denies any visual disturbance.  He denies any focal extremity weakness aside from his right arm.  He denies dysarthria.  Notably, the patient continues to drink alcohol  at least 4 days/week.  He states that he drinks  a couple shots of vodka at each sitting.  He continues to smoke 1 pack/day x 40 years.  He denies illicit drug use. He does endorse several episodes of emesis prior to coming to the hospital without any blood.  He also endorsed having some loose stools but denied any hematochezia or melena.  He denies any abdominal pain, dysuria, hematuria.  He denies any coughing or hemoptysis. In addition, the patient states that he has been not completely compliant with his antihypertensive medications nor his lactulose .  In the ED, the patient was afebrile and hemodynamically stable with oxygen saturation 97% room air.  WBC 10.3, hemoglobin 12.0, platelet 274.  Sodium 133, potassium 3.6, bicarbonate 22, serum creatinine 1.16.  AST 82, ALT 33, alk phosphatase 166, total bilirubin 1.9.  Albumin  4.0.  Troponin 33>> 32.  CPK 215.  D-dimer 6.17. CTA chest negative for PE.  No focal consolidation or effusion on CTA chest.  X-ray of the right arm showed an acutely displaced and comminuted right humerus head and neck fracture.  EDP spoke with orthopedic surgery, Dr.  Onesimo who will consult.  Review of Systems: As mentioned in the history of present illness. All other systems reviewed and are negative. Past Medical History:  Diagnosis Date   Anxiety    Arthritis    CAD (coronary artery disease) 2007   50% stenosis small diagonal   Central sleep apnea    Chronic pain    since 1998 has been on chronic pain medication   Fatty liver    GERD (gastroesophageal reflux disease)    Hemorrhoids    Hepatomegaly 10/03/2014    HTN (hypertension)    Palpitations    Syncope    Tachycardia    Tobacco abuse    Tubular adenoma    Wolff-Parkinson-White (WPW) syndrome    says was cured with ablation   Past Surgical History:  Procedure Laterality Date   ADENOIDECTOMY     APPENDECTOMY     ATRIAL ABLATION SURGERY     AV NODE ABLATION     BIOPSY N/A 05/12/2013   Procedure: BIOPSY;  Surgeon: Lamar CHRISTELLA Hollingshead, MD;  Location: [facility];  Service: Endoscopy;  Laterality: N/A;   BIOPSY  12/20/2020   Procedure: BIOPSY;  Surgeon: Hollingshead Lamar CHRISTELLA, MD;  Location: [facility];  Service: Endoscopy;;   BIOPSY  09/18/2022   Procedure: BIOPSY;  Surgeon: Eda Iha, MD;  Location: [facility] ENDOSCOPY;  Service: Gastroenterology;;   CARDIAC CATHETERIZATION N/A 05/08/2016   Procedure: Right/Left Heart Cath and Coronary Angiography;  Surgeon: Peter M [place], MD;  Location: [facility] INVASIVE CV LAB;  Service: Cardiovascular;  Laterality: N/A;   CARDIAC ELECTROPHYSIOLOGY STUDY AND ABLATION     SA node ablation   COLONOSCOPY     age 49   COLONOSCOPY WITH PROPOFOL  N/A 05/12/2013   Dr. Hollingshead- hemorrhoids, tubular adenoma   ESOPHAGEAL DILATION  12/20/2020   Procedure: ESOPHAGEAL DILATION;  Surgeon: Hollingshead Lamar CHRISTELLA, MD;  Location: [facility];  Service: Endoscopy;;   ESOPHAGEAL DILATION N/A 12/20/2020   Procedure: ESOPHAGEAL DILATION;  Surgeon: Hollingshead Lamar CHRISTELLA, MD;  Location: [facility];  Service: Endoscopy;  Laterality: N/A;   ESOPHAGOGASTRODUODENOSCOPY (EGD) WITH PROPOFOL  N/A 05/12/2013   Dr. Hollingshead- hiatal hernia, erosive reflux esophagitis   ESOPHAGOGASTRODUODENOSCOPY (EGD) WITH PROPOFOL  N/A 12/20/2020   Procedure: ESOPHAGOGASTRODUODENOSCOPY (EGD) WITH PROPOFOL ;  Surgeon: Hollingshead Lamar CHRISTELLA, MD;  Location: [facility];  Service: Endoscopy;  Laterality: N/A;   ESOPHAGOGASTRODUODENOSCOPY (EGD) WITH PROPOFOL  N/A 09/18/2022   Procedure: ESOPHAGOGASTRODUODENOSCOPY (EGD) WITH PROPOFOL ;  Surgeon: Eda Iha, MD;  Location: [facility] ENDOSCOPY;  Service:  Gastroenterology;  Laterality: N/A;   HAND SURGERY     HEMORRHOID BANDING  06/22/13   POLYPECTOMY N/A 05/12/2013   Procedure: POLYPECTOMY;  Surgeon: Lamar CHRISTELLA Hollingshead, MD;  Location: [facility];  Service: Endoscopy;  Laterality: N/A;   SHOULDER SURGERY     Social History:  reports that he has been smoking cigarettes. He has a 12.5 pack-year smoking history. He has never used smokeless tobacco. He reports current alcohol  use. He reports current drug use. Drug: Marijuana.  Allergies  Allergen Reactions   Morphine Other (See Comments)    Makes overly sleepy    Family History  Problem Relation Age of Onset   Coronary artery disease Father        cabg   Colon cancer Father        age 39, chemo/surgery   Breast cancer Mother    Hypertension Mother     Prior to Admission medications   Medication Sig Start Date End Date  Taking? Authorizing Provider  albuterol  (VENTOLIN  HFA) 108 (90 Base) MCG/ACT inhaler Inhale 2 puffs into the lungs every 6 (six) hours as needed for wheezing or shortness of breath.    [provider]  carvedilol  (COREG ) 6.25 MG tablet Take 1 tablet (6.25 mg total) by mouth 2 (two) times daily with a meal. 05/12/23   Breyah Akhter, Alm, MD  cephALEXin (KEFLEX) 500 MG capsule Take 500 mg by mouth every 6 (six) hours. 06/02/23   [provider]  Cholecalciferol  (VITAMIN D3) 25 MCG (1000 UT) CAPS Take 2 capsules by mouth every evening.    [provider]  folic acid  (FOLVITE ) 1 MG tablet Take 1 tablet (1 mg total) by mouth daily. 12/21/20   Pearlean Manus, MD  furosemide  (LASIX ) 20 MG tablet Take 1 tablet (20 mg total) by mouth daily. 05/13/23   Evonnie Alm, MD  gabapentin  (NEURONTIN ) 100 MG capsule Take 2 capsules (200 mg total) by mouth 3 (three) times daily. Patient taking differently: Take 200 mg by mouth 2 (two) times daily. 05/12/23   Evonnie Alm, MD  hydrOXYzine  (ATARAX /VISTARIL ) 25 MG tablet Take 1 tablet (25 mg total) by mouth 3 (three) times daily as needed for  anxiety or nausea. 12/20/20   Pearlean Manus, MD  lactulose  (CHRONULAC ) 10 GM/15ML solution Take 45 mLs (30 g total) by mouth 2 (two) times daily. 05/12/23   Evonnie Alm, MD  lubiprostone (AMITIZA) 8 MCG capsule Take by mouth. 05/23/23   [provider]  oxyCODONE  (ROXICODONE ) 15 MG immediate release tablet Take 1 tablet (15 mg total) by mouth every 6 (six) hours as needed for pain. 05/12/23   Evonnie Alm, MD  pantoprazole  (PROTONIX ) 40 MG tablet Take 40 mg by mouth 2 (two) times daily. 11/06/22   [provider]  potassium chloride  SA (KLOR-CON  M) 20 MEQ tablet Take 2 tablets (40 mEq total) by mouth daily. 05/13/23   Evonnie Alm, MD  prednisoLONE  5 MG TABS tablet 40 mg daily for a total of 21 more days (starting 05/13/23); Then 30 mg daily (starting 06/03/23) for 1 week, 20 mg daily(starting 06/10/23) for 1 week, 10 mg daily (starting 06/17/23) for 1 week, then stop. 05/13/23   Evonnie Alm, MD  spironolactone  (ALDACTONE ) 50 MG tablet Take 1 tablet (50 mg total) by mouth daily. 05/13/23   Evonnie Alm, MD  thiamine  (VITAMIN B-1) 100 MG tablet Take 1 tablet (100 mg total) by mouth daily. 09/21/22   Dennise Lavada POUR, MD  traZODone  (DESYREL ) 100 MG tablet Take 1 tablet (100 mg total) by mouth at bedtime. 05/12/23   Evonnie Alm, MD    Physical Exam: Vitals:   07/04/24 0500 07/04/24 0530 07/04/24 0600 07/04/24 0630  BP: (!) 182/122 (!) 159/123 (!) 180/109 (!) 169/114  Pulse: (!) 125 (!) 132 (!) 123 (!) 117  Resp: 18 18 20 17   Temp:   98.2 F (36.8 C)   TempSrc:      SpO2: 95% 94% 94% 97%  Weight:      Height:       GENERAL:  A&O x 3, NAD, well developed, cooperative, follows commands HEENT: [place]/AT, No thrush, No icterus, No oral ulcers Neck:  No neck mass, No meningismus, soft, supple CV: RRR, no S3, no S4, no rub, no JVD Lungs:  CTA, no wheeze, no rhonchi, good air movement Abd: soft/NT +BS, nondistended Ext: No edema, no lymphangitis, no cyanosis, no rashes Neuro:  CN II-XII intact, strength   RUE limited motion due to pain and  fracture, 4/5RLE, strength 4/5 LUE, LLE; sensation intact bilateral; no dysmetria; babinski equivocal  Data Reviewed: Data reviewed above in the history  Assessment and Plan: Comminuted fracture of the right humerus - Orthopedic surgery, Dr. Onesimo consulted - Judicious opioids  Acute metabolic encephalopathy - The patient appears a little bit slow with his responses with difficult history - Check ammonia - B12 - TSH - CT brain - Folic acid  - Obtain UA - UDS  Alcoholic liver cirrhosis - Patient continues to drink - Restart carvedilol  - Restart lactulose  - Holding furosemide  and spironolactone  temporarily  Diarrhea - Check C. Difficile - Stool pathogen panel  Essential hypertension - Restart carvedilol   Chronic pain syndrome/opioid dependence - PDMP reviewed - Oxycodone  15 mg, #125, last refill 06/03/2024 - Ambien  10 mg, #30, last refill 04/20/2024  Alcohol  abuse - Alcohol  withdrawal protocol - Last alcoholic beverage 07/03/2024  Tobacco abuse - Tobacco cessation discussed  Chronic HFmrEF - 12/19/2020 echo EF 45-50%, grade 1 DD, normal RVF  Hyponatremia - Secondary to liver cirrhosis - Monitor BMP  GERD with history of Barrett's esophagus - Continue pantoprazole  twice daily   Advance Care Planning: FULL  Consults: orthopedic surgery  Family Communication: none  Severity of Illness: The appropriate patient status for this patient is INPATIENT. Inpatient status is judged to be reasonable and necessary in order to provide the required intensity of service to ensure the patient's safety. The patient's presenting symptoms, physical exam findings, and initial radiographic and laboratory data in the context of their chronic comorbidities is felt to place them at high risk for further clinical deterioration. Furthermore, it is not anticipated that the patient will be medically stable for discharge from the hospital within 2 midnights  of admission.   * I certify that at the point of admission it is my clinical judgment that the patient will require inpatient hospital care spanning beyond 2 midnights from the point of admission due to high intensity of service, high risk for further deterioration and high frequency of surveillance required.*  Author: Alm Schneider, MD 07/04/2024 7:21 AM  For on call review [email or web address].

## 2024-07-04 NOTE — Progress Notes (Signed)
*  PRELIMINARY RESULTS* Echocardiogram 2D Echocardiogram has been performed with Definity.  Timothy [name] 07/04/2024, 2:03 PM

## 2024-07-04 NOTE — ED Notes (Signed)
Pt given another cup of water

## 2024-07-04 NOTE — ED Notes (Signed)
 Pt has drank 2 cups of water  per request

## 2024-07-04 NOTE — ED Notes (Addendum)
 Timothy [name]

## 2024-07-04 NOTE — ED Provider Notes (Signed)
 [place] EMERGENCY DEPARTMENT AT [facility] Provider Note   CSN: [id number] Arrival date & time: 07/03/24  2255     Patient presents with: Arm Injury (Chest pain/)   Timothy [name] is a 53 y.o. male.   presents with severe arm pain and facial stinging. The arm pain began after an incident last Saturday, initially thought to be a fall, but significant bruising and pain developed later, reportedly after an episode described by his wife as a terror while asleep in a recliner. The pain is described as 10/10, affecting the entire arm, including the inside, and is not alleviated by any measures. The patient also reports a stinging sensation in the face, described as pins and needles, which began upon leaving the house for the hospital. He denies any facial pain upon touch. 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 He typically takes oxycodone  at least three times daily and has been on it since the 1990s. He also takes clarithromycin and carvedilol . The patient reports nausea but denies vomiting. He has not been under regular medical care since his previous doctor retired and has not established care with a new physician. History was obtained from the patient.   Arm Injury      Prior to Admission medications   Medication Sig Start Date End Date Taking? Authorizing Provider  albuterol  (VENTOLIN  HFA) 108 (90 Base) MCG/ACT inhaler Inhale 2 puffs into the lungs every 6 (six) hours as needed for wheezing or shortness of breath.    [provider]  carvedilol  (COREG ) 6.25 MG tablet Take 1 tablet (6.25 mg total) by mouth 2 (two) times daily with a meal. 05/12/23   Tat, Alm, MD  cephALEXin (KEFLEX) 500 MG capsule Take 500 mg by mouth every 6 (six) hours. 06/02/23   [provider]  Cholecalciferol  (VITAMIN D3) 25 MCG (1000 UT) CAPS Take 2 capsules by mouth every evening.    [provider]  folic acid  (FOLVITE ) 1 MG tablet Take 1 tablet (1 mg total) by mouth daily. 12/21/20   Pearlean Manus, MD  furosemide  (LASIX ) 20 MG tablet Take 1 tablet (20 mg total) by mouth daily. 05/13/23   Evonnie Alm, MD  gabapentin  (NEURONTIN ) 100 MG capsule Take 2 capsules (200 mg total) by mouth 3 (three) times daily. Patient taking differently: Take 200 mg by mouth 2 (two) times daily. 05/12/23   Evonnie Alm, MD  hydrOXYzine  (ATARAX /VISTARIL ) 25 MG tablet Take 1 tablet (25 mg total) by mouth 3 (three) times daily as needed for anxiety or nausea. 12/20/20   Pearlean Manus, MD  lactulose  (CHRONULAC ) 10 GM/15ML solution Take 45 mLs (30 g total) by mouth 2 (two) times daily. 05/12/23   Evonnie Alm, MD  lubiprostone (AMITIZA) 8 MCG capsule Take by mouth. 05/23/23   [provider]  oxyCODONE  (ROXICODONE ) 15 MG immediate release tablet Take 1 tablet (15 mg total) by mouth every 6 (six) hours as needed for pain. 05/12/23   Evonnie Alm, MD  pantoprazole  (PROTONIX ) 40 MG tablet Take 40 mg by mouth 2 (two) times daily. 11/06/22   [provider]  potassium chloride  SA (KLOR-CON  M) 20 MEQ tablet Take 2 tablets (40 mEq total) by mouth daily. 05/13/23   Evonnie Alm, MD  prednisoLONE  5 MG TABS tablet 40 mg daily for a total of 21 more days (starting 05/13/23); Then 30 mg daily (starting 06/03/23) for 1 week, 20 mg  daily(starting 06/10/23) for 1 week, 10 mg daily (starting 06/17/23) for 1 week, then stop. 05/13/23   Evonnie Lenis, MD  spironolactone  (ALDACTONE ) 50 MG tablet Take 1 tablet (50 mg total) by mouth daily. 05/13/23   Evonnie Lenis, MD  thiamine  (VITAMIN B-1) 100 MG tablet Take 1 tablet (100 mg total) by mouth daily. 09/21/22   Dennise Lavada POUR, MD  traZODone  (DESYREL ) 100 MG tablet Take 1 tablet (100 mg total) by mouth at bedtime. 05/12/23   Evonnie Lenis, MD    Allergies: Morphine    Review of Systems  Updated Vital Signs BP (!) 180/109   Pulse (!) 123   Temp 98.2 F (36.8 C)   Resp 20   Ht 6'  (1.829 m)   Wt 91.7 kg   SpO2 94%   BMI 27.42 kg/m   Physical Exam Vitals and nursing note reviewed.  Constitutional:      Appearance: He is well-developed.  HENT:     Head: Normocephalic and atraumatic.  Cardiovascular:     Rate and Rhythm: Tachycardia present.     Comments: hypertensive Pulmonary:     Effort: Pulmonary effort is normal. No respiratory distress.  Abdominal:     General: There is no distension.  Musculoskeletal:        General: Normal range of motion.     Cervical back: Normal range of motion.     Comments: Pain to right shoulder, significant swelling and ecchymosis to right upper arm  Neurological:     Mental Status: He is alert.     (all labs ordered are listed, but only abnormal results are displayed) Labs Reviewed  CBC WITH DIFFERENTIAL/PLATELET - Abnormal; Notable for the following components:      Result Value   Hemoglobin 12.0 (*)    HCT 35.8 (*)    RDW 17.1 (*)    Monocytes Absolute 1.7 (*)    All other components within normal limits  COMPREHENSIVE METABOLIC PANEL WITH GFR - Abnormal; Notable for the following components:   Sodium 133 (*)    Chloride 93 (*)    Glucose, Bld 128 (*)    Calcium  8.5 (*)    AST 82 (*)    Alkaline Phosphatase 166 (*)    Total Bilirubin 1.9 (*)    Anion gap 18 (*)    All other components within normal limits  PRO BRAIN NATRIURETIC PEPTIDE - Abnormal; Notable for the following components:   Pro Brain Natriuretic Peptide 1,272.0 (*)    All other components within normal limits  D-DIMER, QUANTITATIVE - Abnormal; Notable for the following components:   D-Dimer, Quant 6.17 (*)    All other components within normal limits  TROPONIN T, HIGH SENSITIVITY - Abnormal; Notable for the following components:   Troponin T High Sensitivity 33 (*)    All other components within normal limits  TROPONIN T, HIGH SENSITIVITY - Abnormal; Notable for the following components:   Troponin T High Sensitivity 32 (*)    All other  components within normal limits  CK    EKG: None  Radiology: CT Angio Chest PE W and/or Wo Contrast Result Date: 07/04/2024 CLINICAL DATA:  Pulmonary embolism (PE) suspected, low to intermediate prob, positive D-dimer reviewed fall. Bruises. Chest pain and arm pain. Short of breath. EXAM: CT ANGIOGRAPHY CHEST WITH CONTRAST TECHNIQUE: Multidetector CT imaging of the chest was performed using the standard protocol during bolus administration of intravenous contrast. Multiplanar CT image reconstructions and MIPs were obtained to evaluate the vascular  anatomy. RADIATION DOSE REDUCTION: This exam was performed according to the departmental dose-optimization program which includes automated exposure control, adjustment of the mA and/or kV according to patient size and/or use of iterative reconstruction technique. CONTRAST:  75mL OMNIPAQUE  IOHEXOL  350 MG/ML SOLN COMPARISON:  Chest x-ray 07/03/2024, CT abdomen 05/01/2023 FINDINGS: Cardiovascular: Satisfactory opacification of the pulmonary arteries to the segmental level. No evidence of pulmonary embolism. Limited evaluation of the subsegmental level due to timing of contrast. Normal heart size. No significant pericardial effusion. Limited evaluation of the aortic root due to motion artifact. Possible ectasia of the aortic root. Otherwise the thoracic aorta is normal in caliber. Mild-to-moderate atherosclerotic plaque of the thoracic aorta. At least 3 vessel coronary artery calcifications. Mediastinum/Nodes: No enlarged mediastinal, hilar, or axillary lymph nodes. Thyroid  gland, trachea, and esophagus demonstrate no significant findings. Lungs/Pleura: No focal consolidation. No pulmonary nodule. No pulmonary mass. No pleural effusion. No pneumothorax. Upper Abdomen: No acute abnormality. Musculoskeletal: No chest wall abnormality. No suspicious lytic or blastic osseous lesions. Acute markedly comminuted and displaced humerus with fracture fragment superiorly  dislocated in relation to the to the acetabula and impacted on the superior glenoid (10:93). Interval development of age-indeterminate L1 superior endplate compression fracture with at least 40% vertebral body height loss. Mild degenerative changes of the spine. Review of the MIP images confirms the above findings. IMPRESSION: 1. No pulmonary embolus with limited evaluation of the subsegmental level due to timing of contrast. 2. Acute markedly comminuted and displaced humerus with fracture fragment superiorly dislocated in relation to the acetabulum and impacted on the superior glenoid. 3. Interval development of age-indeterminate L1 superior endplate compression fracture with at least 40% vertebral body height loss. Correlate with point tenderness to palpation to evaluate for an acute component. 4. Limited evaluation of the aortic root due to motion artifact. Possible ectasia of the aortic root. 5. Aortic Atherosclerosis (ICD10-I70.0) including three-vessel coronary artery calcification. Electronically Signed   By: Morgane  Naveau M.D.   On: 07/04/2024 03:49   DG Chest 2 View Result Date: 07/03/2024 EXAM: 2 VIEW(S) XRAY OF THE CHEST 07/03/2024 11:49:04 PM COMPARISON: Chest x-ray 09/17/2022, x-ray right shoulder 07/03/2024. ct chest 09/27/20 CLINICAL HISTORY: eval for fracture. Pt to ED via EMS from home with c/o right arm pain, pt fell a week ago, entire right arm bruised in various stages, EMS report pt had couple of syncopal episodes last week, pt is currently sinus tach with c/o sob, arm pain, and chest pain. ; Did not raise R arm on lateral as we are eval for fracture of humerus FINDINGS: LUNGS AND PLEURA: No focal pulmonary opacity. No pulmonary edema. No pleural effusion. No pneumothorax. HEART AND MEDIASTINUM: No acute abnormality of the cardiac and mediastinal silhouettes. BONES AND SOFT TISSUES: Partially visualized right shoulder fracture - please see separately dictated x-ray right shoulder at  07/03/2024. IMPRESSION: 1. No acute cardiopulmonary disease. 2. Partially visualized right shoulder fracture; refer to the separately dictated right shoulder radiograph for dedicated evaluation. Electronically signed by: Morgane Naveau MD 07/03/2024 11:56 PM EDT RP Workstation: [id number]   DG Humerus Right Result Date: 07/03/2024 EXAM: 2 VIEW(S) XRAY OF THE RIGHT HUMERUS 07/03/2024 11:49:04 PM COMPARISON: None available. CLINICAL HISTORY: eval for fracture. Pt to ED via EMS from home with c/o right arm pain, pt fell a week ago, entire right arm bruised in various stages, EMS report pt had couple of syncopal episodes last week, pt is currently sinus tach with c/o sob, arm pain, and chest pain. FINDINGS: BONES  AND JOINTS: Acute displaced and comminuted right humeral head and neck fracture. No definite dislocation from this 2 view only radiograph. SOFT TISSUES: Soft tissues edema. IMPRESSION: 1. Acute displaced and comminuted right humeral head and neck fracture. 2. No definite dislocation on this 2-view radiograph. Electronically signed by: Morgane Naveau MD 07/03/2024 11:54 PM EDT RP Workstation: [id number]     .Critical Care  Performed by: Lorette Mayo, MD Authorized by: Lorette Mayo, MD   Critical care provider statement:    Critical care time (minutes):  30   Critical care was necessary to treat or prevent imminent or life-threatening deterioration of the following conditions:  Cardiac failure   Critical care was time spent personally by me on the following activities:  Development of treatment plan with patient or surrogate, discussions with consultants, evaluation of patient's response to treatment, examination of patient, ordering and review of laboratory studies, ordering and review of radiographic studies, ordering and performing treatments and interventions, pulse oximetry, re-evaluation of patient's condition and review of old charts    Medications Ordered in the ED  dicyclomine  (BENTYL) capsule 20 mg (has no administration in time range)  hydrOXYzine  (ATARAX ) tablet 25 mg (25 mg Oral Given 07/04/24 0349)  loperamide (IMODIUM) capsule 2-4 mg (has no administration in time range)  methocarbamol  (ROBAXIN ) tablet 500 mg (has no administration in time range)  naproxen (NAPROSYN) tablet 500 mg (500 mg Oral Given 07/04/24 0348)  ondansetron  (ZOFRAN -ODT) disintegrating tablet 4 mg (4 mg Oral Given 07/04/24 0349)  cloNIDine  (CATAPRES ) tablet 0.1 mg (0.1 mg Oral Given 07/04/24 0347)    Followed by  cloNIDine  (CATAPRES ) tablet 0.1 mg (has no administration in time range)    Followed by  cloNIDine  (CATAPRES ) tablet 0.1 mg (has no administration in time range)  labetalol  (NORMODYNE ) injection 5 mg (0 mg Intravenous Hold 07/04/24 0512)  lactated ringers  bolus 1,000 mL (0 mLs Intravenous Stopped 07/04/24 0210)  HYDROmorphone  (DILAUDID ) injection 1 mg (1 mg Intravenous Given 07/03/24 2358)  ondansetron  (ZOFRAN ) injection 4 mg (4 mg Intravenous Given 07/03/24 2355)  HYDROmorphone  (DILAUDID ) injection 1 mg (1 mg Intravenous Given 07/04/24 0126)  iohexol  (OMNIPAQUE ) 350 MG/ML injection 100 mL (75 mLs Intravenous Contrast Given 07/04/24 0303)  furosemide  (LASIX ) injection 40 mg (40 mg Intravenous Given 07/04/24 0349)  HYDROmorphone  (DILAUDID ) injection 1 mg (1 mg Intravenous Given 07/04/24 0509)                                    Medical Decision Making Amount and/or Complexity of Data Reviewed Labs: ordered. Radiology: ordered.  Risk Prescription drug management. Decision regarding hospitalization.   Patient found to have a right humeral fracture.  Discussed with Dr. Bessie with orthopedics who will see him upon admission.  Patient also found to have hypertension, tachycardia, elevated BNP, elevated D-dimer with (negative CT for PE).  Very well could be related to opioid withdrawal, pain, fluid overload.  Started on withdrawal medications.  Pain meds given but not really  controlling pain very well.  Discussed with hospitalist for admission.  Final diagnoses:  None    ED Discharge Orders     None          Bryndle Corredor, Mayo, MD 07/04/24 [phone]

## 2024-07-04 NOTE — ED Notes (Signed)
 Pt sitting up in bed, pt has removed chest leads and sling, pt states that his pain is a 10/10 and he is feeling anxious, pt skin is cool and dry.

## 2024-07-04 NOTE — ED Notes (Signed)
 Pt given urinal.

## 2024-07-04 NOTE — ED Notes (Signed)
 Ice water given to pt

## 2024-07-05 DIAGNOSIS — F1090 Alcohol use, unspecified, uncomplicated: Secondary | ICD-10-CM

## 2024-07-05 DIAGNOSIS — R112 Nausea with vomiting, unspecified: Secondary | ICD-10-CM | POA: Diagnosis not present

## 2024-07-05 DIAGNOSIS — K7030 Alcoholic cirrhosis of liver without ascites: Secondary | ICD-10-CM | POA: Diagnosis not present

## 2024-07-05 DIAGNOSIS — F1010 Alcohol abuse, uncomplicated: Secondary | ICD-10-CM | POA: Diagnosis not present

## 2024-07-05 DIAGNOSIS — S42351K Displaced comminuted fracture of shaft of humerus, right arm, subsequent encounter for fracture with nonunion: Secondary | ICD-10-CM

## 2024-07-05 DIAGNOSIS — R569 Unspecified convulsions: Secondary | ICD-10-CM

## 2024-07-05 DIAGNOSIS — R4182 Altered mental status, unspecified: Secondary | ICD-10-CM

## 2024-07-05 DIAGNOSIS — R1110 Vomiting, unspecified: Secondary | ICD-10-CM

## 2024-07-05 DIAGNOSIS — R52 Pain, unspecified: Secondary | ICD-10-CM | POA: Diagnosis not present

## 2024-07-05 LAB — PHOSPHORUS: Phosphorus: 2.2 mg/dL — ABNORMAL LOW (ref 2.5–4.6)

## 2024-07-05 LAB — COMPREHENSIVE METABOLIC PANEL WITH GFR
ALT: 30 U/L (ref 0–44)
AST: 65 U/L — ABNORMAL HIGH (ref 15–41)
Albumin: 3.8 g/dL (ref 3.5–5.0)
Alkaline Phosphatase: 148 U/L — ABNORMAL HIGH (ref 38–126)
Anion gap: 15 (ref 5–15)
BUN: 26 mg/dL — ABNORMAL HIGH (ref 6–20)
CO2: 27 mmol/L (ref 22–32)
Calcium: 8.9 mg/dL (ref 8.9–10.3)
Chloride: 85 mmol/L — ABNORMAL LOW (ref 98–111)
Creatinine, Ser: 1.5 mg/dL — ABNORMAL HIGH (ref 0.61–1.24)
GFR, Estimated: 55 mL/min — ABNORMAL LOW (ref >60)
Glucose, Bld: 84 mg/dL (ref 70–99)
Potassium: 3 mmol/L — ABNORMAL LOW (ref 3.5–5.1)
Sodium: 127 mmol/L — ABNORMAL LOW (ref 135–145)
Total Bilirubin: 2.6 mg/dL — ABNORMAL HIGH (ref 0.0–1.2)
Total Protein: 7.1 g/dL (ref 6.5–8.1)

## 2024-07-05 LAB — CBC
HCT: 35.4 % — ABNORMAL LOW (ref 39.0–52.0)
Hemoglobin: 11.5 g/dL — ABNORMAL LOW (ref 13.0–17.0)
MCH: 28 pg (ref 26.0–34.0)
MCHC: 32.5 g/dL (ref 30.0–36.0)
MCV: 86.1 fL (ref 80.0–100.0)
Platelets: 209 K/uL (ref 150–400)
RBC: 4.11 MIL/uL — ABNORMAL LOW (ref 4.22–5.81)
RDW: 17.1 % — ABNORMAL HIGH (ref 11.5–15.5)
WBC: 7 K/uL (ref 4.0–10.5)
nRBC: 0 % (ref 0.0–0.2)

## 2024-07-05 LAB — PROTIME-INR
INR: 1.1 (ref 0.8–1.2)
Prothrombin Time: 15.3 s — ABNORMAL HIGH (ref 11.4–15.2)

## 2024-07-05 LAB — MAGNESIUM: Magnesium: 1.9 mg/dL (ref 1.7–2.4)

## 2024-07-05 LAB — AMMONIA: Ammonia: 51 umol/L — ABNORMAL HIGH (ref 9–35)

## 2024-07-05 MED ORDER — POTASSIUM CHLORIDE IN NACL 20-0.9 MEQ/L-% IV SOLN: INTRAVENOUS | Status: AC

## 2024-07-05 MED ORDER — POTASSIUM CHLORIDE 20 MEQ PO PACK
40.0000 meq | PACK | Freq: Once | ORAL | Status: AC
  Administered 2024-07-05: 40 meq via ORAL
  Filled 2024-07-05: qty 2

## 2024-07-05 MED ORDER — LACTATED RINGERS IV SOLN: INTRAVENOUS | Status: AC

## 2024-07-05 MED ORDER — K PHOS MONO-SOD PHOS DI & MONO 155-852-130 MG PO TABS
500.0000 mg | ORAL_TABLET | Freq: Two times a day (BID) | ORAL | Status: DC
  Administered 2024-07-05 – 2024-07-08 (×6): 500 mg via ORAL
  Filled 2024-07-05 (×8): qty 2

## 2024-07-05 NOTE — Progress Notes (Signed)
 Patient experiencing urinary retention. He endorses the urge to void but is having difficulty. He also states this has been an ongoing issue for the past five years but is not being treated for it although he has seen a urologist in the past. He refuses in and out cath at this time. RN will obtain an order in the event the patient is unable to void. Bladder scan greater than 300.

## 2024-07-05 NOTE — Progress Notes (Addendum)
 PROGRESS NOTE  Timothy [name] FMW:[id number] DOB: 1971-05-17 DOA: 07/03/2024 PCP: Bertell Satterfield, MD  Brief History:  53 year old male with history of HTN, hyperlipidemia opioid dependent, chronic HFmrEF, Barrett esophagus, PUD, WPW status post ablation, NICM, alcohol  abuse, and Etoh liver cirrhosis presenting with severe right arm pain.  The patient is a difficult historian often giving tangential answers with conflicting chronological timelines.  In general, the patient believes that he sustained a mechanical fall about 10 days prior to this admission while playing with his son.  He stated that he tripped over an ottoman and fall onto his right side.  He stated that he had some right arm pain at that time, but it had resolved after several minutes. About 2 days later, apparently the patient woke up after sleeping in his recliner.  Apparently his wife described the patient as having a night terror.  Apparently the patient was confused and fell onto his right side at that time after which he had significant arm pain.  He stated that he continued to have some intermittent arm pain throughout the week but did not think much of it. However, he noted that on 07/02/2024 there was significant swelling and edema about his right upper arm which had worsened over the next 48 hours prompting him to come to the emergency department for further evaluation. He had denied any interim syncopal episodes, but had complained of some dizziness. He states he has had some subjective fevers and chills.  As he was preparing to come to the emergency department he noticed some pins and needle sensation on his whole face bilaterally down through his neck bilaterally and down to his chest.  He had complained of sharp chest pain at that time lasting about 2 hours.  He had some shortness of breath.  At the time of my evaluation he is pain-free with regard to his chest pain and dysesthesia on his face and neck.  He  denies any visual disturbance.  He denies any focal extremity weakness aside from his right arm.  He denies dysarthria.  Notably, the patient continues to drink alcohol  at least 4 days/week.  He states that he drinks  a couple shots of vodka at each sitting.  He continues to smoke 1 pack/day x 40 years.  He denies illicit drug use. He does endorse several episodes of emesis prior to coming to the hospital without any blood.  He also endorsed having some loose stools but denied any hematochezia or melena.  He denies any abdominal pain, dysuria, hematuria.  He denies any coughing or hemoptysis. In addition, the patient states that he has been not completely compliant with his antihypertensive medications nor his lactulose .  In the ED, the patient was afebrile and hemodynamically stable with oxygen saturation 97% room air.  WBC 10.3, hemoglobin 12.0, platelet 274.  Sodium 133, potassium 3.6, bicarbonate 22, serum creatinine 1.16.  AST 82, ALT 33, alk phosphatase 166, total bilirubin 1.9.  Albumin  4.0.  Troponin 33>> 32.  CPK 215.  D-dimer 6.17. CTA chest negative for PE.  No focal consolidation or effusion on CTA chest.  X-ray of the right arm showed an acutely displaced and comminuted right humerus head and neck fracture.  EDP spoke with orthopedic surgery, Dr. Onesimo who will consult.   Assessment/Plan: Comminuted fracture of the right humerus - Orthopedic surgery, Dr. Onesimo consulted - Judicious opioids - pt states pain is uncontrolled - increase dilaudid  to 2  mg q 4   Acute metabolic encephalopathy/hepatic encephalopathy - The patient appears a little bit slow with his responses with difficult history - EEG neg - Check ammonia 72>>51 - B12--631 - TSH--5.290 - CT brain--neg - Folic acid --4.9>>>supplement - Obtain UA--no pyuria - UDS +THC - 10/21--more alert   Intractable vomiting -?cannabis hyperemesis>>UDS shows +THC -pt has hx of Barrett's -consult GI -continue pantoprazole   bid -make npo as he states he cannot tolerate water   AKI - Baseline creatinine 0.9-1.0 - Serum creatinine up to 1.50 - Secondary to volume depletion - Start IV fluids  Alcoholic liver cirrhosis - Patient continues to drink - Restarted carvedilol  - Restarted lactulose >>increase to TID as pt states he has not had any BMs since admission - Holding furosemide  and spironolactone  temporarily   Diarrhea - Check C. Difficile--no BM to collect  - Stool pathogen panel--no BM to collect  Essential hypertension - Restart carvedilol    Chronic pain syndrome/opioid dependence - PDMP reviewed - Oxycodone  15 mg, #125, last refill 06/03/2024 - Ambien  10 mg, #30, last refill 04/20/2024   Alcohol  abuse - Alcohol  withdrawal protocol - Last alcoholic beverage 07/03/2024   Tobacco abuse - Tobacco cessation discussed   Chronic HFmrEF - 12/19/2020 echo EF 45-50%, grade 1 DD, normal RVF - clinically compensated   Hyponatremia - Secondary to liver cirrhosis - Monitor BMP  Hypokalemia/hypophosphatemia - repleting        Family Communication:   father updated at bedside 10/21  Consultants:  GI  Code Status:  FULL   DVT Prophylaxis:   [place] Lovenox    Procedures: As Listed in Progress Note Above  Antibiotics: None      Subjective: Pt states he has vomiting 8-9 times during day shift.  Denies chest pain, shortness breath, abdominal pain.  He states he has not had a bowel movement since admission.  He denies any headache, neck pain.  Complains of uncontrolled right arm pain.  Objective: Vitals:   07/04/24 1959 07/05/24 0023 07/05/24 0141 07/05/24 1201  BP: (!) 135/114 (!) 140/91 (!) 132/90 (!) 129/91  Pulse: 88 97 85 89  Resp: 20 20 18 18   Temp: 98.7 F (37.1 C)  97.7 F (36.5 C) 97.6 F (36.4 C)  TempSrc: Oral  Oral Oral  SpO2: 94% 98% 96% 98%  Weight:      Height:       No intake or output data in the 24 hours ending 07/05/24 1205 Weight change:  Exam:  General:   Pt is alert, follows commands appropriately, not in acute distress HEENT: No icterus, No thrush, No neck mass, [place]/AT Cardiovascular: RRR, S1/S2, no rubs, no gallops Respiratory: Bibasilar rales.  No wheezing. Abdomen: Soft/+BS, non tender, non distended, no guarding Extremities: No  LE edema, No lymphangitis, No petechiae, No rashes, no synovitis;  RUE capillary refill less than 2 seconds.  Bicep compartment is soft.  Dorsal compartments are soft.  Radial pulses present.   Data Reviewed: I have personally reviewed following labs and imaging studies Basic Metabolic Panel: Recent Labs  Lab 07/04/24 0016 07/04/24 1130 07/05/24 0335  NA 133*  --  127*  K 3.6  --  3.0*  CL 93*  --  85*  CO2 22  --  27  GLUCOSE 128*  --  84  BUN 16  --  26*  CREATININE 1.16  --  1.50*  CALCIUM  8.5*  --  8.9  MG  --  1.6* 1.9  PHOS  --  2.1* 2.2*   Liver  Function Tests: Recent Labs  Lab 07/04/24 0016 07/05/24 0335  AST 82* 65*  ALT 33 30  ALKPHOS 166* 148*  BILITOT 1.9* 2.6*  PROT 7.7 7.1  ALBUMIN  4.0 3.8   No results for input(s): LIPASE, AMYLASE in the last 168 hours. Recent Labs  Lab 07/04/24 1130 07/05/24 0948  AMMONIA 72* 51*   Coagulation Profile: No results for input(s): INR, PROTIME in the last 168 hours. CBC: Recent Labs  Lab 07/04/24 0016 07/05/24 0335  WBC 10.3 7.0  NEUTROABS 7.1  --   HGB 12.0* 11.5*  HCT 35.8* 35.4*  MCV 83.4 86.1  PLT 274 209   Cardiac Enzymes: Recent Labs  Lab 07/04/24 0016  CKTOTAL 255   BNP: Invalid input(s): POCBNP CBG: No results for input(s): GLUCAP in the last 168 hours. HbA1C: No results for input(s): HGBA1C in the last 72 hours. Urine analysis:    Component Value Date/Time   COLORURINE YELLOW 07/04/2024 1126   APPEARANCEUR CLEAR 07/04/2024 1126   LABSPEC 1.023 07/04/2024 1126   PHURINE 6.0 07/04/2024 1126   GLUCOSEU NEGATIVE 07/04/2024 1126   HGBUR SMALL (A) 07/04/2024 1126   BILIRUBINUR NEGATIVE 07/04/2024  1126   KETONESUR NEGATIVE 07/04/2024 1126   PROTEINUR 100 (A) 07/04/2024 1126   UROBILINOGEN 0.2 10/29/2014 0937   NITRITE NEGATIVE 07/04/2024 1126   LEUKOCYTESUR NEGATIVE 07/04/2024 1126   Sepsis Labs: @LABRCNTIP (procalcitonin:4,lacticidven:4) )No results found for this or any previous visit (from the past 240 hours).   Scheduled Meds:  carvedilol   6.25 mg Oral BID WC   cholecalciferol   2,000 Units Oral QPM   enoxaparin  (LOVENOX ) injection  40 mg Subcutaneous Q24H   folic acid   1 mg Oral Daily   gabapentin   200 mg Oral BID   lactulose   30 g Oral BID   multivitamin with minerals  1 tablet Oral Daily   nicotine   21 mg Transdermal Daily   pantoprazole   40 mg Oral BID   phosphorus  500 mg Oral BID   thiamine   100 mg Oral Daily   Or   thiamine   100 mg Intravenous Daily   traZODone   100 mg Oral QHS   Continuous Infusions:  lactated ringers  75 mL/hr at 07/05/24 9044    Procedures/Studies: EEG adult Result Date: 07/05/2024 Shelton Arlin KIDD, MD     07/05/2024  8:26 AM Patient Name: Timothy [name] MRN: [id number] Epilepsy Attending: Arlin KIDD Shelton Referring Physician/Provider: Evonnie Lenis, MD Date: 07/04/2024 Duration: 23.44 mins Patient history: 53yo M with ams. EEG to evaluate for seizure Level of alertness: Awake, asleep AEDs during EEG study: GBP, Ativan  Technical aspects: This EEG study was done with scalp electrodes positioned according to the 10-20 International system of electrode placement. Electrical activity was reviewed with band pass filter of 1-70Hz , sensitivity of 7 uV/mm, display speed of 55mm/sec with a 60Hz  notched filter applied as appropriate. EEG data were recorded continuously and digitally stored.  Video monitoring was available and reviewed as appropriate. Description: The posterior dominant rhythm consists of 8Hz  activity of moderate voltage (25-35 uV) seen predominantly in posterior head regions, symmetric and reactive to eye opening and eye closing. Sleep was  characterized by vertex waves, sleep spindles (12 to 14 Hz), maximal frontocentral region. Hyperventilation and photic stimulation were not performed.   IMPRESSION: This study is within normal limits. No seizures or epileptiform discharges were seen throughout the recording. A normal interictal EEG does not exclude the diagnosis of epilepsy. Priyanka O Yadav   CT HEAD WO CONTRAST ( )  Result Date: 07/04/2024 CLINICAL DATA:  Fall altered EXAM: CT HEAD WITHOUT CONTRAST TECHNIQUE: Contiguous axial images were obtained from the base of the skull through the vertex without intravenous contrast. RADIATION DOSE REDUCTION: This exam was performed according to the departmental dose-optimization program which includes automated exposure control, adjustment of the mA and/or kV according to patient size and/or use of iterative reconstruction technique. COMPARISON:  CT brain 09/24/2021 FINDINGS: Brain: Mild motion degradation. No acute territorial infarction, hemorrhage or intracranial mass. Mild atrophy. Nonenlarged ventricles. Vascular: No hyperdense vessels.  Carotid vascular calcification Skull: No fracture Sinuses/Orbits: No acute finding. Other: None IMPRESSION: Mild motion degradation. No CT evidence for acute intracranial abnormality. Mild atrophy. Electronically Signed   By: Luke Bun M.D.   On: 07/04/2024 17:40   ECHOCARDIOGRAM COMPLETE Result Date: 07/04/2024    ECHOCARDIOGRAM REPORT   Patient Name:   Timothy [name] Date of Exam: 07/04/2024 Medical Rec #:  [id number]       Height:       72.0 in Accession #:    [id number]      Weight:       202.2 lb Date of Birth:  1971-03-16       BSA:          2.140 m Patient Age:    53 years        BP:           150/108 mmHg Patient Gender: M               HR:           97 bpm. Exam Location:  [facility] Procedure: 2D Echo, Cardiac Doppler, Color Doppler and Intracardiac            Opacification Agent (Both Spectral and Color Flow Doppler were            utilized during  procedure). Indications:    Chest Pain R07.9  History:        Patient has prior history of Echocardiogram examinations, most                 recent 12/19/2020. CHF and Cardiomyopathy, CAD, Arrythmias:RBBB,                 Signs/Symptoms:Syncope; Risk Factors:Current Smoker,                 Hypertension and Dyslipidemia. Hx of ETOH abuse and                 Wolff-Parkinson-White (WPW) syndrome.  Sonographer:    Aida Pizza RCS Referring Phys: [phone] Kalicia Dufresne IMPRESSIONS  1. No LV thrombus by Definity. Left ventricular ejection fraction, by estimation, is 50 to 55%. The left ventricle has low normal function. The left ventricle demonstrates regional wall motion abnormalities, basal septal hypokinesis. There is moderate left ventricular hypertrophy of the septal segment. Left ventricular diastolic parameters are consistent with Grade I diastolic dysfunction (impaired relaxation). Elevated left ventricular end-diastolic pressure.  2. Right ventricular systolic function is normal. The right ventricular size is normal. Tricuspid regurgitation signal is inadequate for assessing PA pressure.  3. The mitral valve is abnormal. No evidence of mitral valve regurgitation. No evidence of mitral stenosis. Moderate mitral annular calcification.  4. The aortic valve has an indeterminant number of cusps. Aortic valve regurgitation is not visualized. No aortic stenosis is present. FINDINGS  Left Ventricle: Left ventricular ejection fraction, by estimation, is 50 to 55%. The left ventricle has low normal function.  The left ventricle demonstrates regional wall motion abnormalities. Definity contrast agent was given IV to delineate the left ventricular endocardial borders. Strain was performed and the global longitudinal strain is indeterminate. The left ventricular internal cavity size was normal in size. There is moderate left ventricular hypertrophy of the septal segment. Left ventricular diastolic parameters are consistent with Grade  I diastolic dysfunction (impaired relaxation). Elevated left ventricular end-diastolic pressure. Right Ventricle: The right ventricular size is normal. No increase in right ventricular wall thickness. Right ventricular systolic function is normal. Tricuspid regurgitation signal is inadequate for assessing PA pressure. Left Atrium: Left atrial size was normal in size. Right Atrium: Right atrial size was normal in size. Pericardium: There is no evidence of pericardial effusion. Mitral Valve: The mitral valve is abnormal. Moderate mitral annular calcification. No evidence of mitral valve regurgitation. No evidence of mitral valve stenosis. Tricuspid Valve: The tricuspid valve is normal in structure. Tricuspid valve regurgitation is trivial. No evidence of tricuspid stenosis. Aortic Valve: The aortic valve has an indeterminant number of cusps. Aortic valve regurgitation is not visualized. No aortic stenosis is present. Pulmonic Valve: The pulmonic valve was grossly normal. Pulmonic valve regurgitation is not visualized. No evidence of pulmonic stenosis. Aorta: The aortic root is normal in size and structure. Venous: The inferior vena cava was not well visualized. IAS/Shunts: The interatrial septum was not well visualized. Additional Comments: 3D was performed not requiring image post processing on an independent workstation and was indeterminate.  LEFT VENTRICLE PLAX 2D LVIDd:         5.00 cm   Diastology LVIDs:         3.80 cm   LV e' medial:    4.85 cm/s LV PW:         1.10 cm   LV E/e' medial:  21.2 LV IVS:        1.40 cm   LV e' lateral:   7.34 cm/s LVOT diam:     2.20 cm   LV E/e' lateral: 14.0 LV SV:         57 LV SV Index:   27 LVOT Area:     3.80 cm  RIGHT VENTRICLE RV S prime:     10.70 cm/s TAPSE (M-mode): 2.5 cm LEFT ATRIUM             Index LA diam:        3.40 cm 1.59 cm/m LA Vol (A2C):   73.4 ml 34.29 ml/m LA Vol (A4C):   55.3 ml 25.84 ml/m LA Biplane Vol: 67.7 ml 31.63 ml/m  AORTIC VALVE LVOT Vmax:    85.70 cm/s LVOT Vmean:  60.000 cm/s LVOT VTI:    0.151 m  AORTA Ao Root diam: 4.00 cm MITRAL VALVE MV Area (PHT): 10.84 cm    SHUNTS MV Decel Time: 70 msec      Systemic VTI:  0.15 m MV E velocity: 103.00 cm/s  Systemic Diam: 2.20 cm Vishnu Priya Mallipeddi Electronically signed by Diannah Late Mallipeddi Signature Date/Time: 07/04/2024/3:33:16 PM    Final    CT Angio Chest PE W and/or Wo Contrast Result Date: 07/04/2024 CLINICAL DATA:  Pulmonary embolism (PE) suspected, low to intermediate prob, positive D-dimer reviewed fall. Bruises. Chest pain and arm pain. Short of breath. EXAM: CT ANGIOGRAPHY CHEST WITH CONTRAST TECHNIQUE: Multidetector CT imaging of the chest was performed using the standard protocol during bolus administration of intravenous contrast. Multiplanar CT image reconstructions and MIPs were obtained to evaluate the vascular anatomy. RADIATION DOSE  REDUCTION: This exam was performed according to the departmental dose-optimization program which includes automated exposure control, adjustment of the mA and/or kV according to patient size and/or use of iterative reconstruction technique. CONTRAST:  75mL OMNIPAQUE  IOHEXOL  350 MG/ML SOLN COMPARISON:  Chest x-ray 07/03/2024, CT abdomen 05/01/2023 FINDINGS: Cardiovascular: Satisfactory opacification of the pulmonary arteries to the segmental level. No evidence of pulmonary embolism. Limited evaluation of the subsegmental level due to timing of contrast. Normal heart size. No significant pericardial effusion. Limited evaluation of the aortic root due to motion artifact. Possible ectasia of the aortic root. Otherwise the thoracic aorta is normal in caliber. Mild-to-moderate atherosclerotic plaque of the thoracic aorta. At least 3 vessel coronary artery calcifications. Mediastinum/Nodes: No enlarged mediastinal, hilar, or axillary lymph nodes. Thyroid  gland, trachea, and esophagus demonstrate no significant findings. Lungs/Pleura: No focal  consolidation. No pulmonary nodule. No pulmonary mass. No pleural effusion. No pneumothorax. Upper Abdomen: No acute abnormality. Musculoskeletal: No chest wall abnormality. No suspicious lytic or blastic osseous lesions. Acute markedly comminuted and displaced humerus with fracture fragment superiorly dislocated in relation to the to the acetabula and impacted on the superior glenoid (10:93). Interval development of age-indeterminate L1 superior endplate compression fracture with at least 40% vertebral body height loss. Mild degenerative changes of the spine. Review of the MIP images confirms the above findings. IMPRESSION: 1. No pulmonary embolus with limited evaluation of the subsegmental level due to timing of contrast. 2. Acute markedly comminuted and displaced humerus with fracture fragment superiorly dislocated in relation to the acetabulum and impacted on the superior glenoid. 3. Interval development of age-indeterminate L1 superior endplate compression fracture with at least 40% vertebral body height loss. Correlate with point tenderness to palpation to evaluate for an acute component. 4. Limited evaluation of the aortic root due to motion artifact. Possible ectasia of the aortic root. 5. Aortic Atherosclerosis (ICD10-I70.0) including three-vessel coronary artery calcification. Electronically Signed   By: Morgane  Naveau M.D.   On: 07/04/2024 03:49   DG Chest 2 View Result Date: 07/03/2024 EXAM: 2 VIEW(S) XRAY OF THE CHEST 07/03/2024 11:49:04 PM COMPARISON: Chest x-ray 09/17/2022, x-ray right shoulder 07/03/2024. ct chest 09/27/20 CLINICAL HISTORY: eval for fracture. Pt to ED via EMS from home with c/o right arm pain, pt fell a week ago, entire right arm bruised in various stages, EMS report pt had couple of syncopal episodes last week, pt is currently sinus tach with c/o sob, arm pain, and chest pain. ; Did not raise R arm on lateral as we are eval for fracture of humerus FINDINGS: LUNGS AND PLEURA: No  focal pulmonary opacity. No pulmonary edema. No pleural effusion. No pneumothorax. HEART AND MEDIASTINUM: No acute abnormality of the cardiac and mediastinal silhouettes. BONES AND SOFT TISSUES: Partially visualized right shoulder fracture - please see separately dictated x-ray right shoulder at 07/03/2024. IMPRESSION: 1. No acute cardiopulmonary disease. 2. Partially visualized right shoulder fracture; refer to the separately dictated right shoulder radiograph for dedicated evaluation. Electronically signed by: Morgane Naveau MD 07/03/2024 11:56 PM EDT RP Workstation: [id number]   DG Humerus Right Result Date: 07/03/2024 EXAM: 2 VIEW(S) XRAY OF THE RIGHT HUMERUS 07/03/2024 11:49:04 PM COMPARISON: None available. CLINICAL HISTORY: eval for fracture. Pt to ED via EMS from home with c/o right arm pain, pt fell a week ago, entire right arm bruised in various stages, EMS report pt had couple of syncopal episodes last week, pt is currently sinus tach with c/o sob, arm pain, and chest pain. FINDINGS: BONES AND JOINTS: Acute  displaced and comminuted right humeral head and neck fracture. No definite dislocation from this 2 view only radiograph. SOFT TISSUES: Soft tissues edema. IMPRESSION: 1. Acute displaced and comminuted right humeral head and neck fracture. 2. No definite dislocation on this 2-view radiograph. Electronically signed by: Morgane Naveau MD 07/03/2024 11:54 PM EDT RP Workstation: [id number]    Alm Schneider, DO  [facility]  If 7PM-7AM, please contact night-coverage [email or web address] Password [email or web address] 07/05/2024, 12:05 PM   LOS: 1 day

## 2024-07-05 NOTE — Procedures (Signed)
 Patient Name: Timothy [name]  MRN: [id number]  Epilepsy Attending: Arlin MALVA Krebs  Referring Physician/Provider: Evonnie Lenis, MD  Date: 07/04/2024 Duration: 23.44 mins  Patient history: 53yo M with ams. EEG to evaluate for seizure  Level of alertness: Awake, asleep  AEDs during EEG study: GBP, Ativan   Technical aspects: This EEG study was done with scalp electrodes positioned according to the 10-20 International system of electrode placement. Electrical activity was reviewed with band pass filter of 1-70Hz , sensitivity of 7 uV/mm, display speed of 60mm/sec with a 60Hz  notched filter applied as appropriate. EEG data were recorded continuously and digitally stored.  Video monitoring was available and reviewed as appropriate.  Description: The posterior dominant rhythm consists of 8Hz  activity of moderate voltage (25-35 uV) seen predominantly in posterior head regions, symmetric and reactive to eye opening and eye closing. Sleep was characterized by vertex waves, sleep spindles (12 to 14 Hz), maximal frontocentral region. Hyperventilation and photic stimulation were not performed.     IMPRESSION: This study is within normal limits. No seizures or epileptiform discharges were seen throughout the recording.  A normal interictal EEG does not exclude the diagnosis of epilepsy.   Antwann Preziosi O Aissatou Fronczak

## 2024-07-05 NOTE — Plan of Care (Signed)

## 2024-07-05 NOTE — Plan of Care (Signed)
  Problem: Education: Goal: Knowledge of General Education information will improve Description: Including pain rating scale, medication(s)/side effects and non-pharmacologic comfort measures 07/05/2024 1906 by Durenda Annabella BRAVO, RN Outcome: Progressing 07/05/2024 1906 by Durenda Annabella BRAVO, RN Outcome: Progressing   Problem: Health Behavior/Discharge Planning: Goal: Ability to manage health-related needs will improve 07/05/2024 1906 by Durenda Annabella BRAVO, RN Outcome: Progressing 07/05/2024 1906 by Durenda Annabella BRAVO, RN Outcome: Progressing   Problem: Clinical Measurements: Goal: Ability to maintain clinical measurements within normal limits will improve 07/05/2024 1906 by Durenda Annabella BRAVO, RN Outcome: Progressing 07/05/2024 1906 by Durenda Annabella BRAVO, RN Outcome: Progressing Goal: Will remain free from infection 07/05/2024 1906 by Durenda Annabella BRAVO, RN Outcome: Progressing 07/05/2024 1906 by Durenda Annabella BRAVO, RN Outcome: Progressing Goal: Diagnostic test results will improve 07/05/2024 1906 by Durenda Annabella BRAVO, RN Outcome: Progressing 07/05/2024 1906 by Durenda Annabella BRAVO, RN Outcome: Progressing   Problem: Activity: Goal: Risk for activity intolerance will decrease 07/05/2024 1906 by Durenda Annabella BRAVO, RN Outcome: Progressing 07/05/2024 1906 by Durenda Annabella BRAVO, RN Outcome: Progressing   Problem: Nutrition: Goal: Adequate nutrition will be maintained 07/05/2024 1906 by Durenda Annabella BRAVO, RN Outcome: Progressing 07/05/2024 1906 by Durenda Annabella BRAVO, RN Outcome: Progressing   Problem: Coping: Goal: Level of anxiety will decrease 07/05/2024 1906 by Durenda Annabella BRAVO, RN Outcome: Progressing 07/05/2024 1906 by Durenda Annabella BRAVO, RN Outcome: Progressing   Problem: Elimination: Goal: Will not experience complications related to bowel motility 07/05/2024 1906 by Durenda Annabella BRAVO, RN Outcome: Progressing 07/05/2024 1906 by  Durenda Annabella BRAVO, RN Outcome: Progressing Goal: Will not experience complications related to urinary retention 07/05/2024 1906 by Durenda Annabella BRAVO, RN Outcome: Progressing 07/05/2024 1906 by Durenda Annabella BRAVO, RN Outcome: Progressing   Problem: Pain Managment: Goal: General experience of comfort will improve and/or be controlled 07/05/2024 1906 by Durenda Annabella BRAVO, RN Outcome: Progressing 07/05/2024 1906 by Durenda Annabella BRAVO, RN Outcome: Progressing   Problem: Safety: Goal: Ability to remain free from injury will improve 07/05/2024 1906 by Durenda Annabella BRAVO, RN Outcome: Progressing 07/05/2024 1906 by Durenda Annabella BRAVO, RN Outcome: Progressing   Problem: Skin Integrity: Goal: Risk for impaired skin integrity will decrease 07/05/2024 1906 by Durenda Annabella BRAVO, RN Outcome: Progressing 07/05/2024 1906 by Durenda Annabella BRAVO, RN Outcome: Progressing

## 2024-07-05 NOTE — Consult Note (Addendum)
 Gastroenterology Consult   Referring Provider: No ref. provider found Primary Care Physician:  Bertell Satterfield, MD Primary Gastroenterologist:  Dr. Shaaron   Patient ID: Timothy [name]; [id number]; [birth year]   Admit date: 07/03/2024  LOS: 1 day   Date of Consultation: 07/05/2024  Reason for Consultation: nausea/vomiting  History of Present Illness   Timothy [name] is a 53 y.o. year old male with history significant for CAD, WPW s/p ablation, chronic pain, diastolic heart failure, GERD, HTN, adenomatous colon polyp, gastric ulcers, alcohol  cirrhosis with continued alcohol  abuse, Barrett's esophagus, history of alcoholic hepatitis who presented to the ED with R arm pain after a fall, found to have displaced and comminuted R humeral head/neck fracture, as well as nausea, vomiting and diarrhea, GI consulted for further evaluation  Labs: Hgb 11.5 today, down from 12 yesterday Plt count 209k Sodium 127, potassium 3 Creat 1.5, BUn 26, AST 65, ALT 30 Alk phos 148, t bili 2.6  Consult: Patient well known to our service, seen recently as outpatient in GI clinic, recommended to schedule EGD and Colonoscopy which have not been done   He states that beginning last week he started having episodes where he will have burning/pins needle sensation to his face, will become diaphoretic and will have some mucus come up in his throat, he will then vomit. He denies any abdominal pain or heartburn. Sometimes symptoms precipitated by eating. He notes a brown color the initial time he vomited as well as 3 BMs over the course of a few hours which were not diarrhea. Denies any new recent medication. Denies rectal bleeding, had a dark stool yesterday that was not black. Denies any dysphagia or odynophagia. Has had some weight loss, down about 44 pounds since last August. Notes decrease in his appetite, he has had some cardiac issues which he feels influences the mucus coming up in his throat. He is taking PRN  famotidine  at home, not taking protonix  at home. Does take baby ASA on occasion, BC powders, about 2 per week, does try to take these with food. Is taking celebrex daily for the past 2 months. He was able to eat some breakfast this morning, english muffin, eggs and sausage which he was able to keep down.   He is still drinking alcohol . 1/5 every 2-3 days.  He smokes about 1 PPD  Last EGD: 09/2022 (Dr. Eda) LA Grade D reflux esophagitis with ulceration, recently bleeding. - Gastritis. Biopsied.Gastric antral and oxyntic mucosa with no specific histopathologic  changes - Helicobacter pylori-like organisms are not identified on routine HE  stain  - No esophageal or gastric varices. Last Colonoscopy: 2014 Hemorrhoids (grade 3 by history)-likely source of hematochezia. Colonic and rectal polyps removed and/or treated as described above.  Past Medical History:  Diagnosis Date   Anxiety    Arthritis    CAD (coronary artery disease) 2007   50% stenosis small diagonal   Central sleep apnea    Chronic pain    since 1998 has been on chronic pain medication   Fatty liver    GERD (gastroesophageal reflux disease)    Hemorrhoids    Hepatomegaly 10/03/2014   HTN (hypertension)    Palpitations    Syncope    Tachycardia    Tobacco abuse    Tubular adenoma    Wolff-Parkinson-White (WPW) syndrome    says was cured with ablation    Past Surgical History:  Procedure Laterality Date   ADENOIDECTOMY     APPENDECTOMY  ATRIAL ABLATION SURGERY     AV NODE ABLATION     BIOPSY N/A 05/12/2013   Procedure: BIOPSY;  Surgeon: Timothy CHRISTELLA Hollingshead, MD;  Location: [facility];  Service: Endoscopy;  Laterality: N/A;   BIOPSY  12/20/2020   Procedure: BIOPSY;  Surgeon: Hollingshead Timothy CHRISTELLA, MD;  Location: [facility];  Service: Endoscopy;;   BIOPSY  09/18/2022   Procedure: BIOPSY;  Surgeon: Eda Iha, MD;  Location: [facility] ENDOSCOPY;  Service: Gastroenterology;;   CARDIAC CATHETERIZATION N/A 05/08/2016   Procedure:  Right/Left Heart Cath and Coronary Angiography;  Surgeon: Peter M Swaziland, MD;  Location: [facility] INVASIVE CV LAB;  Service: Cardiovascular;  Laterality: N/A;   CARDIAC ELECTROPHYSIOLOGY STUDY AND ABLATION     SA node ablation   COLONOSCOPY     age 64   COLONOSCOPY WITH PROPOFOL  N/A 05/12/2013   Dr. Hollingshead- hemorrhoids, tubular adenoma   ESOPHAGEAL DILATION  12/20/2020   Procedure: ESOPHAGEAL DILATION;  Surgeon: Hollingshead Timothy CHRISTELLA, MD;  Location: [facility];  Service: Endoscopy;;   ESOPHAGEAL DILATION N/A 12/20/2020   Procedure: ESOPHAGEAL DILATION;  Surgeon: Hollingshead Timothy CHRISTELLA, MD;  Location: [facility];  Service: Endoscopy;  Laterality: N/A;   ESOPHAGOGASTRODUODENOSCOPY (EGD) WITH PROPOFOL  N/A 05/12/2013   Dr. Hollingshead- hiatal hernia, erosive reflux esophagitis   ESOPHAGOGASTRODUODENOSCOPY (EGD) WITH PROPOFOL  N/A 12/20/2020   Procedure: ESOPHAGOGASTRODUODENOSCOPY (EGD) WITH PROPOFOL ;  Surgeon: Hollingshead Timothy CHRISTELLA, MD;  Location: [facility];  Service: Endoscopy;  Laterality: N/A;   ESOPHAGOGASTRODUODENOSCOPY (EGD) WITH PROPOFOL  N/A 09/18/2022   Procedure: ESOPHAGOGASTRODUODENOSCOPY (EGD) WITH PROPOFOL ;  Surgeon: Eda Iha, MD;  Location: [facility] ENDOSCOPY;  Service: Gastroenterology;  Laterality: N/A;   HAND SURGERY     HEMORRHOID BANDING  06/22/13   POLYPECTOMY N/A 05/12/2013   Procedure: POLYPECTOMY;  Surgeon: Timothy CHRISTELLA Hollingshead, MD;  Location: [facility];  Service: Endoscopy;  Laterality: N/A;   SHOULDER SURGERY      Prior to Admission medications   Medication Sig Start Date End Date Taking? Authorizing Provider  albuterol  (VENTOLIN  HFA) 108 (90 Base) MCG/ACT inhaler Inhale 2 puffs into the lungs every 6 (six) hours as needed for wheezing or shortness of breath.   Yes [provider]  carvedilol  (COREG ) 12.5 MG tablet Take 6.25-12.5 mg by mouth See admin instructions. 6.25mg  in the morning and 12.5mg  at bedtime   Yes [provider]  celecoxib (CELEBREX) 200 MG capsule Take 200 mg by mouth  daily. 06/03/24  Yes [provider]  Cholecalciferol  (VITAMIN D3) 25 MCG (1000 UT) CAPS Take 2 capsules by mouth every evening.   Yes [provider]  cloNIDine  (CATAPRES  - DOSED IN MG/24 HR) 0.3 mg/24hr patch Place 0.3 mg onto the skin once a week. 04/14/24  Yes [provider]  cyclobenzaprine (FLEXERIL) 10 MG tablet Take 10 mg by mouth 2 (two) times daily. 06/03/24  Yes [provider]  folic acid  (FOLVITE ) 1 MG tablet Take 1 tablet (1 mg total) by mouth daily. 12/21/20  Yes Emokpae, Courage, MD  furosemide  (LASIX ) 20 MG tablet Take 1 tablet (20 mg total) by mouth daily. Patient taking differently: Take 20-40 mg by mouth daily. 05/13/23  Yes Tat, Alm, MD  gabapentin  (NEURONTIN ) 100 MG capsule Take 2 capsules (200 mg total) by mouth 3 (three) times daily. Patient taking differently: Take 200 mg by mouth 2 (two) times daily. 05/12/23  Yes Tat, Alm, MD  hydrOXYzine  (ATARAX /VISTARIL ) 25 MG tablet Take 1 tablet (25 mg total) by mouth 3 (three) times daily  as needed for anxiety or nausea. 12/20/20  Yes Emokpae, Courage, MD  lactulose  (CHRONULAC ) 10 GM/15ML solution Take 45 mLs (30 g total) by mouth 2 (two) times daily. Patient taking differently: Take 30 g by mouth 2 (two) times daily as needed for moderate constipation or mild constipation. 05/12/23  Yes Tat, Alm, MD  lubiprostone (AMITIZA) 8 MCG capsule Take 8 mcg by mouth daily as needed for constipation. 05/23/23  Yes [provider]  oxyCODONE  (ROXICODONE ) 15 MG immediate release tablet Take 1 tablet (15 mg total) by mouth every 6 (six) hours as needed for pain. Patient taking differently: Take 15 mg by mouth in the morning, at noon, in the evening, and at bedtime. 05/12/23  Yes Tat, Alm, MD  pantoprazole  (PROTONIX ) 40 MG tablet Take 40 mg by mouth 2 (two) times daily. 11/06/22  Yes [provider]  potassium chloride  SA (KLOR-CON  M) 20 MEQ tablet Take 2 tablets (40 mEq total) by mouth  daily. Patient taking differently: Take 40 mEq by mouth every other day. 05/13/23  Yes Tat, Alm, MD  spironolactone  (ALDACTONE ) 50 MG tablet Take 1 tablet (50 mg total) by mouth daily. 05/13/23  Yes Tat, Alm, MD  thiamine  (VITAMIN B-1) 100 MG tablet Take 1 tablet (100 mg total) by mouth daily. 09/21/22  Yes Dennise Lavada POUR, MD  traZODone  (DESYREL ) 100 MG tablet Take 1 tablet (100 mg total) by mouth at bedtime. Patient taking differently: Take 100 mg by mouth at bedtime as needed for sleep. 05/12/23   Evonnie Alm, MD    Current Facility-Administered Medications  Medication Dose Route Frequency Provider Last Rate Last Admin   0.9 % NaCl with KCl 20 mEq/ L  infusion   Intravenous Continuous Tat, Alm, MD       acetaminophen  (TYLENOL ) tablet 650 mg  650 mg Oral Q6H PRN Tat, Alm, MD       Or   acetaminophen  (TYLENOL ) suppository 650 mg  650 mg Rectal Q6H PRN Tat, Alm, MD       albuterol  (PROVENTIL ) (2.5 MG/3ML) 0.083% nebulizer solution 2.5 mg  2.5 mg Inhalation Q6H PRN Tat, Alm, MD       carvedilol  (COREG ) tablet 6.25 mg  6.25 mg Oral BID WC Tat, Alm, MD   6.25 mg at 07/05/24 9270   cholecalciferol  (VITAMIN D3) 25 MCG (1000 UNIT) tablet 2,000 Units  2,000 Units Oral QPM Evonnie Alm, MD   2,000 Units at 07/04/24 2230   enoxaparin  (LOVENOX ) injection 40 mg  40 mg Subcutaneous Q24H Tat, Alm, MD   40 mg at 07/05/24 1211   folic acid  (FOLVITE ) tablet 1 mg  1 mg Oral Daily Tat, David, MD   1 mg at 07/05/24 1002   gabapentin  (NEURONTIN ) capsule 200 mg  200 mg Oral BID Tat, Alm, MD   200 mg at 07/05/24 1002   HYDROmorphone  (DILAUDID ) injection 1 mg  1 mg Intravenous Q3H PRN Tat, Alm, MD   1 mg at 07/05/24 0957   hydrOXYzine  (ATARAX ) tablet 25 mg  25 mg Oral TID PRN Evonnie Alm, MD   25 mg at 07/05/24 1004   lactated ringers  infusion   Intravenous Continuous Tat, David, MD 75 mL/hr at 07/05/24 0955 New Bag at 07/05/24 0955   lactulose  (CHRONULAC ) 10 GM/15ML solution 30 g  30 g Oral BID  Lenon Elsie HERO, RPH   30 g at 07/05/24 9043   LORazepam  (ATIVAN ) tablet 1-4 mg  1-4 mg Oral Q1H PRN Evonnie Alm, MD   1  mg at 07/05/24 0032   Or   LORazepam  (ATIVAN ) injection 1-4 mg  1-4 mg Intravenous Q1H PRN Evonnie Lenis, MD   1 mg at 07/05/24 1210   methocarbamol  (ROBAXIN ) tablet 500 mg  500 mg Oral Q8H PRN Evonnie Lenis, MD   500 mg at 07/04/24 2001   multivitamin with minerals tablet 1 tablet  1 tablet Oral Daily Tat, David, MD   1 tablet at 07/05/24 1002   nicotine  (NICODERM CQ  - dosed in mg/24 hours) patch 21 mg  21 mg Transdermal Daily Tat, Lenis, MD   21 mg at 07/05/24 1001   ondansetron  (ZOFRAN ) tablet 4 mg  4 mg Oral Q6H PRN Tat, Lenis, MD       Or   ondansetron  (ZOFRAN ) injection 4 mg  4 mg Intravenous Q6H PRN Tat, Lenis, MD   4 mg at 07/04/24 1215   oxyCODONE  (Oxy IR/ROXICODONE ) immediate release tablet 15 mg  15 mg Oral Q6H PRN Tat, Lenis, MD   15 mg at 07/05/24 9271   pantoprazole  (PROTONIX ) EC tablet 40 mg  40 mg Oral BID Tat, Lenis, MD   40 mg at 07/05/24 1003   phosphorus (K PHOS  NEUTRAL) tablet 500 mg  500 mg Oral BID Tat, Lenis, MD   500 mg at 07/05/24 1002   thiamine  (VITAMIN B1) tablet 100 mg  100 mg Oral Daily Tat, David, MD   100 mg at 07/05/24 1003   Or   thiamine  (VITAMIN B1) injection 100 mg  100 mg Intravenous Daily Tat, David, MD   100 mg at 07/04/24 1426   traZODone  (DESYREL ) tablet 100 mg  100 mg Oral QHS Tat, David, MD   100 mg at 07/05/24 0145    Allergies as of 07/03/2024 - Review Complete 07/03/2024  Allergen Reaction Noted   Morphine Other (See Comments)     Family History  Problem Relation Age of Onset   Coronary artery disease Father        cabg   Colon cancer Father        age 4, chemo/surgery   Breast cancer Mother    Hypertension Mother     Social History   Socioeconomic History   Marital status: Married    Spouse name: Not on file   Number of children: 2   Years of education: Not on file   Highest education level: Not on file   Occupational History   Occupation: unemployed    Employer: UNEMPLOYED  Tobacco Use   Smoking status: Every Day    Current packs/day: 0.50    Average packs/day: 0.5 packs/day for 25.0 years (12.5 ttl pk-yrs)    Types: Cigarettes   Smokeless tobacco: Never  Vaping Use   Vaping status: Never Used  Substance and Sexual Activity   Alcohol  use: Yes    Comment: daily; 12/18/20-5 days/week, 6 drinks at time   Drug use: Yes    Types: Marijuana    Comment: hx marijuana use   Sexual activity: Yes    Birth control/protection: None  Other Topics Concern   Not on file  Social History Narrative   Not on file   Social Drivers of Health   Financial Resource Strain: Not on file  Food Insecurity: No Food Insecurity (07/04/2024)   Hunger Vital Sign    Worried About Running Out of Food in the Last Year: Never true    Ran Out of Food in the Last Year: Never true  Transportation Needs: No Transportation Needs (07/04/2024)  PRAPARE - Administrator, Civil Service (Medical): No    Lack of Transportation (Non-Medical): No  Physical Activity: Not on file  Stress: Not on file  Social Connections: Not on file  Intimate Partner Violence: Not At Risk (07/04/2024)   Humiliation, Afraid, Rape, and Kick questionnaire    Fear of Current or Ex-Partner: No    Emotionally Abused: No    Physically Abused: No    Sexually Abused: No    Review of Systems   Gen: Denies any fever, chills, loss of appetite, change in weight or weight loss CV: Denies chest pain, heart palpitations, syncope, edema  Resp: Denies shortness of breath with rest, cough, wheezing, coughing up blood, and pleurisy. GI:  denies melena, hematochezia, constipation, dysphagia, odyonophagia, early satiety or weight loss. +vomiting GU : Denies urinary burning, blood in urine, urinary frequency, and urinary incontinence. MS: Denies cramps, and atrophy. +R arm pain  Derm: Denies rash, itching, dry skin, hives. Psych: Denies  depression, anxiety, memory loss, hallucinations, and confusion. Heme: Denies bruising or bleeding Neuro:  Denies any headaches, dizziness, paresthesias, shaking  Physical Exam   Vital Signs in last 24 hours: Temp:  [97.6 F (36.4 C)-98.7 F (37.1 C)] 97.6 F (36.4 C) (10/21 1201) Pulse Rate:  [85-101] 89 (10/21 1201) Resp:  [18-20] 18 (10/21 1201) BP: (129-155)/(90-114) 129/91 (10/21 1201) SpO2:  [94 %-98 %] 98 % (10/21 1201) Last BM Date : 07/04/24  General:   Alert,  Well-developed, well-nourished, pleasant and cooperative in NAD Head:  Normocephalic and atraumatic. Eyes:  Sclera clear, no icterus.   Conjunctiva pink. Ears:  Normal auditory acuity. Mouth:  No deformity or lesions, dentition normal. Lungs:  Clear throughout to auscultation.   No wheezes, crackles, or rhonchi. No acute distress. Heart:  Regular rate and rhythm; no murmurs, clicks, rubs,  or gallops. Abdomen:  Soft, nontender and nondistended. No masses, hepatosplenomegaly or hernias noted. Normal bowel sounds, without guarding, and without rebound.   Msk:  Symmetrical without gross deformities. Normal posture. Extremities:  bruising noted diffusely to R shoulder and upper arm, yellow and purple in color  Neurologic:  Alert and  oriented x4. Skin:  Intact without significant lesions or rashes. Psych:  Alert and cooperative. Normal mood and affect. Tremulous but no asterixis (only left hand assessed as he cannot lift R arm due to fracture)   Intake/Output from previous day: 10/20 0701 - 10/21 0700 In: -  Out: 700 [Urine:700] Intake/Output this shift: No intake/output data recorded.   Labs/Studies   Recent Labs Recent Labs    07/04/24 0016 07/05/24 0335  WBC 10.3 7.0  HGB 12.0* 11.5*  HCT 35.8* 35.4*  PLT 274 209   BMET Recent Labs    07/04/24 0016 07/05/24 0335  NA 133* 127*  K 3.6 3.0*  CL 93* 85*  CO2 22 27  GLUCOSE 128* 84  BUN 16 26*  CREATININE 1.16 1.50*  CALCIUM  8.5* 8.9    LFT Recent Labs    07/04/24 0016 07/05/24 0335  PROT 7.7 7.1  ALBUMIN  4.0 3.8  AST 82* 65*  ALT 33 30  ALKPHOS 166* 148*  BILITOT 1.9* 2.6*     Assessment   Timothy [name] is a 53 y.o. year old male with history significant for CAD, WPW s/p ablation, chronic pain, diastolic heart failure, GERD, HTN, adenomatous colon polyp, gastric ulcers, alcohol  cirrhosis with continued alcohol  abuse, Barrett's esophagus, history of alcoholic hepatitis who presented to the ED with R arm  pain after a fall, found to have displaced and comminuted R humeral head/neck fracture, as well as nausea, vomiting GI consulted for further evaluation  Vomiting:  -Last EGD 09/2022 with gastritis, esophagitis, no H pylori, lost to follow up for Repeat EGD recommended 8 weeks after -seen recent in GI clinic, recommended to schedule EGD/TCS but this is pending -ongoing ETOH use -+THC on UDS, could be influenced by possible cannabinoid hyperemesis  -more frequent BMs, but denies diarrhea -able to tolerate breakfast this morning without vomiting  -report mucus coming up in his throat, not taking PPI at home, only taking H2B PRN -he is taking celebrex, BC powders a few times per week, ongoing ETOH use -denies dysphagia/odynophagia or abdominal pain   At this time, given multi-morbidities, vomiting likely multifactorial. He endorses sensation of pins and needles and diaphoresis prior to episodes of vomiting, query vagal response/neurological etiology? Certainly at risk for PUD, gastritis, ongoing esophagitis in setting of celebrex, etoh and BC powders, also with THC on UDS, could be dealing with cannabinoid hyperemesis, however, symptoms are not intractable at this time. he has no alarm features and Symptoms are well controlled right now, has not required frequent anti emetics, on PPI BID and is tolerating regular solid food. He is overdue for EGD though would recommend acute Humeral fracture be addressed by ortho to  determine if surgery is warranted first, could consider EGD as outpatient along with planned TCS as long as vomiting is well controlled and no worsening of UGI symptoms.   Alcoholic cirrhosis: -ammonia 72 yesterday, not compliant with lactulose  on regular basis -a/ox4 today, tremulous on exam though--suspect this may be secondary to ETOH withdrawal as he has no asterixis  -continues to use ETOH, about 1/5 of liquor every 2-3 days -occasional dependent edema to his BLLEs but no LE edema or ascites on exam  -taking lasix  40mg  BID daily, tries to be compliant, not taking spironolactone  -last EGD in early 2024 without varices -up to date on [facility] screening via [place]  imaging -needs ongoing outpatient management of cirrhosis   Ultimately needs complete ETOH cessation. Will order INR to calculate MELD score.   In regards to need for potential orthopedic surgery in setting of cirrhosis, his VOCAL-Penn score is: 30 day mortality risk 3.2% 90 day mortality risk 5.5% 180- day mortality risk 6.5% 90 day decompensation risk 6.6%  Plan / Recommendations   -PPI BID -Anti emetics per hospitalist -Lactulose  30mg  BID, titrate to 2-3 soft stools  -MELD labs daily -Needs complete alcohol  cessation -Supplement folate, thiamine  daily -Check INR to update MELD score  -CIWA protocol per hospitalist  -Consider EGD once acute humeral fracture addressed, could do as outpatient along with TCS as long as vomiting continues to be well managed/tolerating POs  -Outpatient cirrhosis management     07/05/2024, 1:20 PM  Jake Goodson L. Nikeia Henkes, MSN, APRN, AGNP-C Adult-Gerontology Nurse Practitioner [facility] G[facility] at [facility]

## 2024-07-05 NOTE — Plan of Care (Signed)
  Problem: Education: Goal: Knowledge of General Education information will improve Description: Including pain rating scale, medication(s)/side effects and non-pharmacologic comfort measures Outcome: Progressing   Problem: Health Behavior/Discharge Planning: Goal: Ability to manage health-related needs will improve Outcome: Progressing   Problem: Clinical Measurements: Goal: Will remain free from infection Outcome: Progressing Goal: Respiratory complications will improve Outcome: Not Applicable Goal: Cardiovascular complication will be avoided Outcome: Not Applicable

## 2024-07-06 DIAGNOSIS — F1090 Alcohol use, unspecified, uncomplicated: Secondary | ICD-10-CM | POA: Diagnosis not present

## 2024-07-06 DIAGNOSIS — R112 Nausea with vomiting, unspecified: Secondary | ICD-10-CM | POA: Diagnosis not present

## 2024-07-06 DIAGNOSIS — K7030 Alcoholic cirrhosis of liver without ascites: Secondary | ICD-10-CM | POA: Diagnosis not present

## 2024-07-06 DIAGNOSIS — S42351A Displaced comminuted fracture of shaft of humerus, right arm, initial encounter for closed fracture: Secondary | ICD-10-CM | POA: Diagnosis not present

## 2024-07-06 DIAGNOSIS — S42351K Displaced comminuted fracture of shaft of humerus, right arm, subsequent encounter for fracture with nonunion: Secondary | ICD-10-CM | POA: Diagnosis not present

## 2024-07-06 LAB — COMPREHENSIVE METABOLIC PANEL WITH GFR
ALT: 27 U/L (ref 0–44)
AST: 63 U/L — ABNORMAL HIGH (ref 15–41)
Albumin: 3.6 g/dL (ref 3.5–5.0)
Alkaline Phosphatase: 151 U/L — ABNORMAL HIGH (ref 38–126)
Anion gap: 12 (ref 5–15)
BUN: 25 mg/dL — ABNORMAL HIGH (ref 6–20)
CO2: 28 mmol/L (ref 22–32)
Calcium: 9.1 mg/dL (ref 8.9–10.3)
Chloride: 92 mmol/L — ABNORMAL LOW (ref 98–111)
Creatinine, Ser: 1.28 mg/dL — ABNORMAL HIGH (ref 0.61–1.24)
GFR, Estimated: >60 mL/min (ref >60)
Glucose, Bld: 82 mg/dL (ref 70–99)
Potassium: 3.3 mmol/L — ABNORMAL LOW (ref 3.5–5.1)
Sodium: 132 mmol/L — ABNORMAL LOW (ref 135–145)
Total Bilirubin: 2.4 mg/dL — ABNORMAL HIGH (ref 0.0–1.2)
Total Protein: 6.9 g/dL (ref 6.5–8.1)

## 2024-07-06 LAB — AMMONIA: Ammonia: 28 umol/L (ref 9–35)

## 2024-07-06 LAB — PHOSPHORUS: Phosphorus: 4.1 mg/dL (ref 2.5–4.6)

## 2024-07-06 LAB — MAGNESIUM: Magnesium: 2.1 mg/dL (ref 1.7–2.4)

## 2024-07-06 NOTE — Plan of Care (Signed)
   Problem: Activity: Goal: Risk for activity intolerance will decrease Outcome: Progressing   Problem: Nutrition: Goal: Adequate nutrition will be maintained Outcome: Progressing   Problem: Coping: Goal: Level of anxiety will decrease Outcome: Progressing

## 2024-07-06 NOTE — Evaluation (Signed)
 Physical Therapy Evaluation Patient Details Name: Timothy [name] MRN: [id number] DOB: [birth year] Today's Date: 07/06/2024  History of Present Illness  Timothy [name] is a 53 year old male with history of HTN, hyperlipidemia opioid dependent, chronic HFmrEF, Barrett esophagus, PUD, WPW status post ablation, NICM, alcohol  abuse, and Etoh liver cirrhosis presenting with severe right arm pain.     The patient is a difficult historian often giving tangential answers with conflicting chronological timelines.     In general, the patient believes that he sustained a mechanical fall about 10 days prior to this admission while playing with his son.  He stated that he tripped over an ottoman and fall onto his right side.  He stated that he had some right arm pain at that time, but it had resolved after several minutes.  About 2 days later, apparently the patient woke up after sleeping in his recliner.  Apparently his wife described the patient as having a night terror.  Apparently the patient was confused and fell onto his right side at that time after which he had significant arm pain.  He stated that he continued to have some intermittent arm pain throughout the week but did not think much of it.  However, he noted that on 07/02/2024 there was significant swelling and edema about his right upper arm which had worsened over the next 48 hours prompting him to come to the emergency department for further evaluation.  He had denied any interim syncopal episodes, but had complained of some dizziness.  He states he has had some subjective fevers and chills.  As he was preparing to come to the emergency department he noticed some pins and needle sensation on his whole face bilaterally down through his neck bilaterally and down to his chest.  He had complained of sharp chest pain at that time lasting about 2 hours.  He had some shortness of breath.  At the time of my evaluation he is pain-free with regard to his chest pain  and dysesthesia on his face and neck.  He denies any visual disturbance.  He denies any focal extremity weakness aside from his right arm.  He denies dysarthria.   Clinical Impression  Pt. Presented with generalized weakness in the LE, and sling on RUE. Pt. Had difficulty with bed mobility initially and needed  CGA to get to EOB. Pt. Used IV pole w/ CGA to Min A for sit to stand transfer and ambulation to the bathroom. Pt. Was fatigued during ambulation to bathroom. Nursing staff was notified on pt. Status. Patient will benefit from continued skilled physical therapy in hospital and recommended venue below to increase strength, balance, endurance for safe ADLs and gait.          If plan is discharge home, recommend the following: A little help with walking and/or transfers;Assistance with cooking/housework;A little help with bathing/dressing/bathroom;Help with stairs or ramp for entrance;Assist for transportation   Can travel by private vehicle        Equipment Recommendations None recommended by PT  Recommendations for Other Services       Functional Status Assessment Patient has had a recent decline in their functional status and demonstrates the ability to make significant improvements in function in a reasonable and predictable amount of time.     Precautions / Restrictions Precautions Precautions: Fall;Shoulder Type of Shoulder Precautions: no UE WB Shoulder Interventions: Shoulder sling/immobilizer Recall of Precautions/Restrictions: Intact Required Braces or Orthoses: Sling Restrictions Weight Bearing Restrictions Per Provider Order: No  Mobility  Bed Mobility Overal bed mobility: Needs Assistance Bed Mobility: Supine to Sit     Supine to sit: Contact guard, Min assist     General bed mobility comments: Pt. lays in reclined chair    Transfers Overall transfer level: Needs assistance Equipment used: 1 person hand held assist (Pt. used IV pole for  transfer.) Transfers: Sit to/from Stand Sit to Stand: Min assist, Contact guard assist           General transfer comment: Pt. used IV pole to transfer from sit to stand, to ambulate to bathroom. ( in comments for equipment used)    Ambulation/Gait Ambulation/Gait assistance: Min assist, Contact guard assist Gait Distance (Feet): 15 Feet Assistive device: IV Pole Gait Pattern/deviations: Step-to pattern, Step-through pattern, Decreased step length - right, Decreased step length - left, Decreased stance time - right, Decreased stride length, Narrow base of support       General Gait Details: Pt. was able to ambulate to the bathroom, with Min A and IV for stability. Pt. had diffculty with progression of gait during ambulation  Stairs            Wheelchair Mobility     Tilt Bed    Modified Rankin (Stroke Patients Only)       Balance Overall balance assessment: Needs assistance Sitting-balance support: Feet supported, Single extremity supported Sitting balance-Leahy Scale: Fair Sitting balance - Comments: fair/good for sitting balance   Standing balance support: During functional activity, Reliant on assistive device for balance Standing balance-Leahy Scale: Fair Standing balance comment: Fair- for standing balance, needed IV pole for addtional support.                             Pertinent Vitals/Pain Pain Assessment Pain Assessment: 0-10 Pain Score: 10-Worst pain ever Pain Location: R. Shoulder, and Back Pain Descriptors / Indicators: Pins and needles, Discomfort, Jabbing, Other (Comment) (Pt. mentions popping noise.)    Home Living Family/patient expects to be discharged to:: Private residence Living Arrangements: Spouse/significant other;Children Available Help at Discharge: Family;Available PRN/intermittently Type of Home: House Home Access: Stairs to enter Entrance Stairs-Rails: Right;Left Entrance Stairs-Number of Steps: 7   Home Layout:  One level Home Equipment: Agricultural consultant (2 wheels);Cane - single point Additional Comments: Pt. spouse helps with physically needs in home    Prior Function Prior Level of Function : Needs assist       Physical Assist : Mobility (physical);ADLs (physical) Mobility (physical): Gait;Transfers;Stairs ADLs (physical): Grooming;Bathing;Dressing Mobility Comments: Pt. is househld ambulator with cane or RW, diffculty with mobility. ADLs Comments: Pt. reports having difficulty with self care task such as bathing, grooming, and toileting independently, Pt. spouses helps with self-care, and shopping for pt.     Extremity/Trunk Assessment   Upper Extremity Assessment Upper Extremity Assessment: Defer to OT evaluation    Lower Extremity Assessment Lower Extremity Assessment: Generalized weakness    Cervical / Trunk Assessment Cervical / Trunk Assessment: Normal  Communication   Communication Communication: No apparent difficulties    Cognition Arousal: Alert Behavior During Therapy: WFL for tasks assessed/performed   PT - Cognitive impairments: No apparent impairments                         Following commands: Intact       Cueing Cueing Techniques: Verbal cues, Tactile cues     General Comments      Exercises  Assessment/Plan    PT Assessment Patient needs continued PT services  PT Problem List Decreased strength;Decreased range of motion;Decreased activity tolerance;Decreased balance;Decreased mobility       PT Treatment Interventions DME instruction;Gait training;Stair training;Functional mobility training;Therapeutic activities;Therapeutic exercise;Balance training;Patient/family education    PT Goals (Current goals can be found in the Care Plan section)  Acute Rehab PT Goals Patient Stated Goal: Pt. wants to return hom with family PT Goal Formulation: With patient Time For Goal Achievement: 07/13/24 Potential to Achieve Goals: Good     Frequency Min 3X/week     Co-evaluation PT/OT/SLP Co-Evaluation/Treatment: Yes Reason for Co-Treatment: To address functional/ADL transfers PT goals addressed during session: Mobility/safety with mobility;Strengthening/ROM OT goals addressed during session: ADL's and self-care       AM-PAC PT 6 Clicks Mobility  Outcome Measure Help needed turning from your back to your side while in a flat bed without using bedrails?: A Little Help needed moving from lying on your back to sitting on the side of a flat bed without using bedrails?: A Lot Help needed moving to and from a bed to a chair (including a wheelchair)?: A Lot Help needed standing up from a chair using your arms (e.g., wheelchair or bedside chair)?: A Lot Help needed to walk in hospital room?: A Lot Help needed climbing 3-5 steps with a railing? : A Lot 6 Click Score: 13    End of Session Equipment Utilized During Treatment: Gait belt Activity Tolerance: Patient tolerated treatment well;Patient limited by fatigue;Patient limited by pain Patient left: with nursing/sitter in room;Other (comment) (Left in the bathroom with mobility tech.) Nurse Communication: Mobility status PT Visit Diagnosis: Unsteadiness on feet (R26.81);Repeated falls (R29.6);Muscle weakness (generalized) (M62.81)    Time: 9182-9150 PT Time Calculation (min) (ACUTE ONLY): 32 min   Charges:   PT Evaluation $PT Eval Moderate Complexity: 1 Mod PT Treatments $Therapeutic Activity: 23-37 mins PT General Charges $$ ACUTE PT VISIT: 1 Visit        Timothy [name], SPT

## 2024-07-06 NOTE — Progress Notes (Signed)
 Subjective: States doing well from GI standpoint. Arm is throbbing and causing him a lot of pain. He is hungry and ready to eat.   Objective: Vital signs in last 24 hours: Temp:  [97.5 F (36.4 C)-98.2 F (36.8 C)] 97.8 F (36.6 C) (10/22 0401) Pulse Rate:  [85-97] 85 (10/22 0401) Resp:  [17-18] 17 (10/22 0401) BP: (129-142)/(90-98) 139/90 (10/22 0401) SpO2:  [96 %-98 %] 97 % (10/22 0401) Last BM Date : 07/04/24 General:   Alert and oriented, pleasant Head:  Normocephalic and atraumatic. Eyes:  No icterus, sclera clear. Conjuctiva pink.  Mouth:  Without lesions, mucosa pink and moist.  Heart:  S1, S2 present, no murmurs noted.  Lungs: Clear to auscultation bilaterally, without wheezing, rales, or rhonchi.  Abdomen:  Bowel sounds present, soft, non-tender, non-distended. No HSM or hernias noted. No rebound or guarding. No masses appreciated  Extremities:  +bruising to R arm  Neurologic:  Alert and  oriented x4;  grossly normal neurologically. Skin:  Warm and dry, intact without significant lesions. +bruising to R upper arm  Psych:  Alert and cooperative. Normal mood and affect. No asterixis   Intake/Output from previous day: 10/21 0701 - 10/22 0700 In: 1260.5 [I.V.:1260.5] Out: 600 [Urine:600] Intake/Output this shift: No intake/output data recorded.  Lab Results: Recent Labs    07/04/24 0016 07/05/24 0335  WBC 10.3 7.0  HGB 12.0* 11.5*  HCT 35.8* 35.4*  PLT 274 209   BMET Recent Labs    07/04/24 0016 07/05/24 0335 07/06/24 0409  NA 133* 127* 132*  K 3.6 3.0* 3.3*  CL 93* 85* 92*  CO2 22 27 28   GLUCOSE 128* 84 82  BUN 16 26* 25*  CREATININE 1.16 1.50* 1.28*  CALCIUM  8.5* 8.9 9.1   LFT Recent Labs    07/04/24 0016 07/05/24 0335 07/06/24 0409  PROT 7.7 7.1 6.9  ALBUMIN  4.0 3.8 3.6  AST 82* 65* 63*  ALT 33 30 27  ALKPHOS 166* 148* 151*  BILITOT 1.9* 2.6* 2.4*   PT/INR Recent Labs    07/05/24 1550  LABPROT 15.3*  INR 1.1     Studies/Results: EEG adult Result Date: 07/05/2024 [name] Arlin KIDD, MD     07/05/2024  8:26 AM Patient Name: Timothy [name] MRN: [id number] Epilepsy Attending: Arlin KIDD [name] Referring Physician/Provider: Evonnie Lenis, MD Date: 07/04/2024 Duration: 23.44 mins Patient history: 53yo M with ams. EEG to evaluate for seizure Level of alertness: Awake, asleep AEDs during EEG study: GBP, Ativan  Technical aspects: This EEG study was done with scalp electrodes positioned according to the 10-20 International system of electrode placement. Electrical activity was reviewed with band pass filter of 1-70Hz , sensitivity of 7 uV/mm, display speed of 67mm/sec with a 60Hz  notched filter applied as appropriate. EEG data were recorded continuously and digitally stored.  Video monitoring was available and reviewed as appropriate. Description: The posterior dominant rhythm consists of 8Hz  activity of moderate voltage (25-35 uV) seen predominantly in posterior head regions, symmetric and reactive to eye opening and eye closing. Sleep was characterized by vertex waves, sleep spindles (12 to 14 Hz), maximal frontocentral region. Hyperventilation and photic stimulation were not performed.   IMPRESSION: This study is within normal limits. No seizures or epileptiform discharges were seen throughout the recording. A normal interictal EEG does not exclude the diagnosis of epilepsy. Timothy [name]   CT HEAD WO CONTRAST ( ) Result Date: 07/04/2024 CLINICAL DATA:  Fall altered EXAM: CT HEAD WITHOUT CONTRAST TECHNIQUE: Contiguous axial images were obtained  from the base of the skull through the vertex without intravenous contrast. RADIATION DOSE REDUCTION: This exam was performed according to the departmental dose-optimization program which includes automated exposure control, adjustment of the mA and/or kV according to patient size and/or use of iterative reconstruction technique. COMPARISON:  CT brain 09/24/2021 FINDINGS: Brain:  Mild motion degradation. No acute territorial infarction, hemorrhage or intracranial mass. Mild atrophy. Nonenlarged ventricles. Vascular: No hyperdense vessels.  Carotid vascular calcification Skull: No fracture Sinuses/Orbits: No acute finding. Other: None IMPRESSION: Mild motion degradation. No CT evidence for acute intracranial abnormality. Mild atrophy. Electronically Signed   By: Luke Bun M.D.   On: 07/04/2024 17:40   ECHOCARDIOGRAM COMPLETE Result Date: 07/04/2024    ECHOCARDIOGRAM REPORT   Patient Name:   Timothy [name] Date of Exam: 07/04/2024 Medical Rec #:  [id number]       Height:       72.0 in Accession #:    [id number]      Weight:       202.2 lb Date of Birth:  Oct 05, 1970       BSA:          2.140 m Patient Age:    53 years        BP:           150/108 mmHg Patient Gender: M               HR:           97 bpm. Exam Location:  [facility] Procedure: 2D Echo, Cardiac Doppler, Color Doppler and Intracardiac            Opacification Agent (Both Spectral and Color Flow Doppler were            utilized during procedure). Indications:    Chest Pain R07.9  History:        Patient has prior history of Echocardiogram examinations, most                 recent 12/19/2020. CHF and Cardiomyopathy, CAD, Arrythmias:RBBB,                 Signs/Symptoms:Syncope; Risk Factors:Current Smoker,                 Hypertension and Dyslipidemia. Hx of ETOH abuse and                 Wolff-Parkinson-White (WPW) syndrome.  Sonographer:    Aida Pizza RCS Referring Phys: [phone] DAVID TAT IMPRESSIONS  1. No LV thrombus by Definity. Left ventricular ejection fraction, by estimation, is 50 to 55%. The left ventricle has low normal function. The left ventricle demonstrates regional wall motion abnormalities, basal septal hypokinesis. There is moderate left ventricular hypertrophy of the septal segment. Left ventricular diastolic parameters are consistent with Grade I diastolic dysfunction (impaired relaxation). Elevated left  ventricular end-diastolic pressure.  2. Right ventricular systolic function is normal. The right ventricular size is normal. Tricuspid regurgitation signal is inadequate for assessing PA pressure.  3. The mitral valve is abnormal. No evidence of mitral valve regurgitation. No evidence of mitral stenosis. Moderate mitral annular calcification.  4. The aortic valve has an indeterminant number of cusps. Aortic valve regurgitation is not visualized. No aortic stenosis is present. FINDINGS  Left Ventricle: Left ventricular ejection fraction, by estimation, is 50 to 55%. The left ventricle has low normal function. The left ventricle demonstrates regional wall motion abnormalities. Definity contrast agent was given IV to delineate the left  ventricular endocardial borders. Strain was performed and the global longitudinal strain is indeterminate. The left ventricular internal cavity size was normal in size. There is moderate left ventricular hypertrophy of the septal segment. Left ventricular diastolic parameters are consistent with Grade I diastolic dysfunction (impaired relaxation). Elevated left ventricular end-diastolic pressure. Right Ventricle: The right ventricular size is normal. No increase in right ventricular wall thickness. Right ventricular systolic function is normal. Tricuspid regurgitation signal is inadequate for assessing PA pressure. Left Atrium: Left atrial size was normal in size. Right Atrium: Right atrial size was normal in size. Pericardium: There is no evidence of pericardial effusion. Mitral Valve: The mitral valve is abnormal. Moderate mitral annular calcification. No evidence of mitral valve regurgitation. No evidence of mitral valve stenosis. Tricuspid Valve: The tricuspid valve is normal in structure. Tricuspid valve regurgitation is trivial. No evidence of tricuspid stenosis. Aortic Valve: The aortic valve has an indeterminant number of cusps. Aortic valve regurgitation is not visualized. No  aortic stenosis is present. Pulmonic Valve: The pulmonic valve was grossly normal. Pulmonic valve regurgitation is not visualized. No evidence of pulmonic stenosis. Aorta: The aortic root is normal in size and structure. Venous: The inferior vena cava was not well visualized. IAS/Shunts: The interatrial septum was not well visualized. Additional Comments: 3D was performed not requiring image post processing on an independent workstation and was indeterminate.  LEFT VENTRICLE PLAX 2D LVIDd:         5.00 cm   Diastology LVIDs:         3.80 cm   LV e' medial:    4.85 cm/s LV PW:         1.10 cm   LV E/e' medial:  21.2 LV IVS:        1.40 cm   LV e' lateral:   7.34 cm/s LVOT diam:     2.20 cm   LV E/e' lateral: 14.0 LV SV:         57 LV SV Index:   27 LVOT Area:     3.80 cm  RIGHT VENTRICLE RV S prime:     10.70 cm/s TAPSE (M-mode): 2.5 cm LEFT ATRIUM             Index LA diam:        3.40 cm 1.59 cm/m LA Vol (A2C):   73.4 ml 34.29 ml/m LA Vol (A4C):   55.3 ml 25.84 ml/m LA Biplane Vol: 67.7 ml 31.63 ml/m  AORTIC VALVE LVOT Vmax:   85.70 cm/s LVOT Vmean:  60.000 cm/s LVOT VTI:    0.151 m  AORTA Ao Root diam: 4.00 cm MITRAL VALVE MV Area (PHT): 10.84 cm    SHUNTS MV Decel Time: 70 msec      Systemic VTI:  0.15 m MV E velocity: 103.00 cm/s  Systemic Diam: 2.20 cm Vishnu Priya Mallipeddi Electronically signed by Diannah Late Mallipeddi Signature Date/Time: 07/04/2024/3:33:16 PM    Final     Assessment: Timothy [name] is a 53 y.o. year old male with history significant for CAD, WPW s/p ablation, chronic pain, diastolic heart failure, GERD, HTN, adenomatous colon polyp, gastric ulcers, alcohol  cirrhosis with continued alcohol  abuse, Barrett's esophagus, history of alcoholic hepatitis who presented to the ED with R arm pain after a fall, found to have displaced and comminuted R humeral head/neck fracture, as well as nausea, vomiting GI consulted for further evaluation   Vomiting:  -Last EGD 09/2022 with  gastritis, esophagitis, no H pylori, lost to follow up  for Repeat EGD recommended 8 weeks after -seen recent in GI clinic, recommended to schedule EGD/TCS but this is pending -ongoing ETOH use -+THC on UDS, could be influenced by possible cannabinoid hyperemesis  -more frequent BMs, but denies diarrhea -able to tolerate breakfast this morning without vomiting  -report mucus coming up in his throat, not taking PPI at home, only taking H2B PRN -he is taking celebrex, BC powders a few times per week, ongoing ETOH use -denies dysphagia/odynophagia or abdominal pain    At this time, given multi-morbidities, vomiting likely multifactorial. He endorses sensation of pins and needles and diaphoresis prior to episodes of vomiting, query vagal response/neurological etiology? Certainly at risk for PUD, gastritis, ongoing esophagitis in setting of celebrex, etoh and BC powders, also with THC on UDS, could be dealing with cannabinoid hyperemesis, however, symptoms are not intractable at this time. he has no alarm features and Symptoms are well controlled right now, has not required frequent anti emetics, on PPI BID and is tolerating regular solid food. He is overdue for EGD though would recommend acute Humeral fracture be addressed by ortho to determine if surgery is warranted first, could consider EGD as outpatient along with planned TCS as long as vomiting is well controlled and no worsening of UGI symptoms.    Alcoholic cirrhosis: -ammonia 72 yesterday, not compliant with lactulose  on regular basis -a/ox4 today, tremulous on exam though--suspect this may be secondary to ETOH withdrawal as he has no asterixis  -continues to use ETOH, about 1/5 of liquor every 2-3 days -occasional dependent edema to his BLLEs but no LE edema or ascites on exam  -taking lasix  40mg  BID daily, tries to be compliant, not taking spironolactone  -last EGD in early 2024 without varices -up to date on [facility] screening via [place]   imaging -needs ongoing outpatient management of cirrhosis   MELD 3.0 21   Ultimately needs complete ETOH cessation.   In regards to need for potential orthopedic surgery in setting of cirrhosis, his VOCAL-Penn score is: 30 day mortality risk 3.2% 90 day mortality risk 5.5% 180- day mortality risk 6.5% 90 day decompensation risk 6.6%  recommend tertiary care facility if surgical repair of humeral fracture is warranted given concern for decompensation risk.  Plan per ortho: patient to be transferred to [facility] for repair of humeral fracture tomorrow.   Plan: -PPI BID -Anti emetics per hospitalist -Lactulose  30mg  BID, titrate to 2-3 soft stools  -MELD labs daily -Needs complete alcohol  cessation -Supplement folate, thiamine  daily -Check INR to update MELD score  -CIWA protocol per hospitalist  -Consider EGD once acute humeral fracture addressed, could do as outpatient along with TCS as long as vomiting continues to be well managed/tolerating POs  -Outpatient cirrhosis management  -can have 2g sodium diet today, from GI standpoint    LOS: 2 days    07/06/2024, 9:07 AM   Akshitha Culmer L. Alaena Strader, MSN, APRN, AGNP-C Adult-Gerontology Nurse Practitioner [facility] at [facility]

## 2024-07-06 NOTE — H&P (View-Only) (Signed)
 ORTHOPAEDIC CONSULTATION  REQUESTING PHYSICIAN: Maree Bracken D, DO  ASSESSMENT AND PLAN: 53 y.o. male with the following: Comminuted right proximal humerus fracture  Patient has been admitted for medical reasons.  He is to remain hospitalized for the next 1-2 days until he is medically able to go home.  He has a complicated issue with the right shoulder.  In addition, he has multiple medical comorbidities.  I discussed this with the patient.  I do not think that his injury is amenable to open reduction and internal fixation with plate and screws.  As such, he would require complex reconstruction.  Given his age, and desire to return to previous level of function, he is interested in a reconstruction.  His care will continue to be discussed with the medical team, as well as anesthesia.  His medical comorbidities may not allow him to proceed with surgery at [facility]  - Weight Bearing Status/Activity: Non weightbearing right upper extremity, in a sling  - Additional recommended labs/tests: None  -VTE Prophylaxis: As needed  - Pain control: As needed  - Follow-up plan: To be determined  -Procedures: None plan currently  Chief Complaint: Right shoulder pain  HPI: Timothy [name] is a 53 y.o. male with complicated medical history, as indicated below.  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  He does not provide a direct story, and some this information has been gleaned from review of the chart.  He reportedly had a couple of falls within the last week, which has resulted in shoulder pain.  He is on chronic narcotics, and per the ED, his pain was so severe and he had run out of medicine, so he presented for evaluation.  He is right-hand dominant.  He states he has limited use of the left shoulder.  He noted a lot of bruising and swelling in the right hand.  He denies numbness and  tingling.  He notes that he is unable to move the right shoulder due to pain.  Past Medical History:  Diagnosis Date   Anxiety    Arthritis    CAD (coronary artery disease) 2007   50% stenosis small diagonal   Central sleep apnea    Chronic pain    since 1998 has been on chronic pain medication   Fatty liver    GERD (gastroesophageal reflux disease)    Hemorrhoids    Hepatomegaly 10/03/2014   HTN (hypertension)    Palpitations    Syncope    Tachycardia    Tobacco abuse    Tubular adenoma    Wolff-Parkinson-White (WPW) syndrome    says was cured with ablation   Past Surgical History:  Procedure Laterality Date   ADENOIDECTOMY     APPENDECTOMY     ATRIAL ABLATION SURGERY     AV NODE ABLATION     BIOPSY N/A 05/12/2013   Procedure: BIOPSY;  Surgeon: Lamar CHRISTELLA Hollingshead, MD;  Location: [facility] ORS;  Service: Endoscopy;  Laterality: N/A;   BIOPSY  12/20/2020   Procedure: BIOPSY;  Surgeon: Hollingshead Lamar CHRISTELLA, MD;  Location: [facility] ENDO SUITE;  Service: Endoscopy;;   BIOPSY  09/18/2022   Procedure: BIOPSY;  Surgeon: Eda Iha, MD;  Location: [facility] ENDOSCOPY;  Service: Gastroenterology;;   CARDIAC CATHETERIZATION N/A 05/08/2016   Procedure: Right/Left Heart Cath and Coronary Angiography;  Surgeon: Peter M Swaziland, MD;  Location: [facility] INVASIVE CV LAB;  Service: Cardiovascular;  Laterality: N/A;   CARDIAC ELECTROPHYSIOLOGY STUDY AND ABLATION     SA node ablation   COLONOSCOPY     age 89   COLONOSCOPY WITH PROPOFOL  N/A 05/12/2013   Dr. Shaaron- hemorrhoids, tubular adenoma   ESOPHAGEAL DILATION  12/20/2020   Procedure: ESOPHAGEAL DILATION;  Surgeon: Shaaron Lamar HERO, MD;  Location: [facility];  Service: Endoscopy;;   ESOPHAGEAL DILATION N/A 12/20/2020   Procedure: ESOPHAGEAL DILATION;  Surgeon: Shaaron Lamar HERO, MD;  Location: [facility];  Service: Endoscopy;  Laterality: N/A;   ESOPHAGOGASTRODUODENOSCOPY (EGD) WITH PROPOFOL  N/A 05/12/2013   Dr. Shaaron- hiatal hernia, erosive reflux esophagitis    ESOPHAGOGASTRODUODENOSCOPY (EGD) WITH PROPOFOL  N/A 12/20/2020   Procedure: ESOPHAGOGASTRODUODENOSCOPY (EGD) WITH PROPOFOL ;  Surgeon: Shaaron Lamar HERO, MD;  Location: [facility];  Service: Endoscopy;  Laterality: N/A;   ESOPHAGOGASTRODUODENOSCOPY (EGD) WITH PROPOFOL  N/A 09/18/2022   Procedure: ESOPHAGOGASTRODUODENOSCOPY (EGD) WITH PROPOFOL ;  Surgeon: Eda Iha, MD;  Location: [facility] ENDOSCOPY;  Service: Gastroenterology;  Laterality: N/A;   HAND SURGERY     HEMORRHOID BANDING  06/22/13   POLYPECTOMY N/A 05/12/2013   Procedure: POLYPECTOMY;  Surgeon: Lamar HERO Shaaron, MD;  Location: [facility];  Service: Endoscopy;  Laterality: N/A;   SHOULDER SURGERY     Social History   Socioeconomic History   Marital status: Married    Spouse name: Not on file   Number of children: 2   Years of education: Not on file   Highest education level: Not on file  Occupational History   Occupation: unemployed    Employer: UNEMPLOYED  Tobacco Use   Smoking status: Every Day    Current packs/day: 0.50    Average packs/day: 0.5 packs/day for 25.0 years (12.5 ttl pk-yrs)    Types: Cigarettes   Smokeless tobacco: Never  Vaping Use   Vaping status: Never Used  Substance and Sexual Activity   Alcohol  use: Yes    Comment: daily; 12/18/20-5 days/week, 6 drinks at time   Drug use: Yes    Types: Marijuana    Comment: hx marijuana use   Sexual activity: Yes    Birth control/protection: None  Other Topics Concern   Not on file  Social History Narrative   Not on file   Social Drivers of Health   Financial Resource Strain: Not on file  Food Insecurity: No Food Insecurity (07/04/2024)   Hunger Vital Sign    Worried About Running Out of Food in the Last Year: Never true    Ran Out of Food in the Last Year: Never true  Transportation Needs: No Transportation Needs (07/04/2024)   PRAPARE - Administrator, Civil Service (Medical): No    Lack of Transportation (Non-Medical): No  Physical Activity: Not on  file  Stress: Not on file  Social Connections: Not on file   Family History  Problem Relation Age of Onset   Coronary artery disease Father        cabg   Colon cancer Father        age 52, chemo/surgery   Breast cancer Mother    Hypertension Mother    Allergies  Allergen Reactions   Morphine Other (See Comments)    Makes overly sleepy   Prior to Admission medications   Medication Sig Start Date End Date Taking? Authorizing Provider  albuterol  (VENTOLIN  HFA) 108 (90 Base) MCG/ACT inhaler Inhale 2 puffs into the lungs every 6 (six) hours as needed for wheezing or shortness of breath.  Yes [provider]  carvedilol  (COREG ) 12.5 MG tablet Take 6.25-12.5 mg by mouth See admin instructions. 6.25mg  in the morning and 12.5mg  at bedtime   Yes [provider]  celecoxib (CELEBREX) 200 MG capsule Take 200 mg by mouth daily. 06/03/24  Yes [provider]  Cholecalciferol  (VITAMIN D3) 25 MCG (1000 UT) CAPS Take 2 capsules by mouth every evening.   Yes [provider]  cloNIDine  (CATAPRES  - DOSED IN MG/24 HR) 0.3 mg/24hr patch Place 0.3 mg onto the skin once a week. 04/14/24  Yes [provider]  cyclobenzaprine (FLEXERIL) 10 MG tablet Take 10 mg by mouth 2 (two) times daily. 06/03/24  Yes [provider]  folic acid  (FOLVITE ) 1 MG tablet Take 1 tablet (1 mg total) by mouth daily. 12/21/20  Yes Emokpae, Courage, MD  furosemide  (LASIX ) 20 MG tablet Take 1 tablet (20 mg total) by mouth daily. Patient taking differently: Take 20-40 mg by mouth daily. 05/13/23  Yes Tat, Alm, MD  gabapentin  (NEURONTIN ) 100 MG capsule Take 2 capsules (200 mg total) by mouth 3 (three) times daily. Patient taking differently: Take 200 mg by mouth 2 (two) times daily. 05/12/23  Yes Tat, Alm, MD  hydrOXYzine  (ATARAX /VISTARIL ) 25 MG tablet Take 1 tablet (25 mg total) by mouth 3 (three) times daily as needed for anxiety or nausea. 12/20/20  Yes Emokpae, Courage, MD  lactulose   (CHRONULAC ) 10 GM/15ML solution Take 45 mLs (30 g total) by mouth 2 (two) times daily. Patient taking differently: Take 30 g by mouth 2 (two) times daily as needed for moderate constipation or mild constipation. 05/12/23  Yes Tat, Alm, MD  lubiprostone (AMITIZA) 8 MCG capsule Take 8 mcg by mouth daily as needed for constipation. 05/23/23  Yes [provider]  oxyCODONE  (ROXICODONE ) 15 MG immediate release tablet Take 1 tablet (15 mg total) by mouth every 6 (six) hours as needed for pain. Patient taking differently: Take 15 mg by mouth in the morning, at noon, in the evening, and at bedtime. 05/12/23  Yes Tat, Alm, MD  pantoprazole  (PROTONIX ) 40 MG tablet Take 40 mg by mouth 2 (two) times daily. 11/06/22  Yes [provider]  potassium chloride  SA (KLOR-CON  M) 20 MEQ tablet Take 2 tablets (40 mEq total) by mouth daily. Patient taking differently: Take 40 mEq by mouth every other day. 05/13/23  Yes Tat, Alm, MD  spironolactone  (ALDACTONE ) 50 MG tablet Take 1 tablet (50 mg total) by mouth daily. 05/13/23  Yes Tat, Alm, MD  thiamine  (VITAMIN B-1) 100 MG tablet Take 1 tablet (100 mg total) by mouth daily. 09/21/22  Yes Dennise Lavada POUR, MD  traZODone  (DESYREL ) 100 MG tablet Take 1 tablet (100 mg total) by mouth at bedtime. Patient taking differently: Take 100 mg by mouth at bedtime as needed for sleep. 05/12/23   Evonnie Alm, MD   EEG adult Result Date: 07/05/2024 Shelton Arlin KIDD, MD     07/05/2024  8:26 AM Patient Name: Timothy [name] MRN: [id number] Epilepsy Attending: Arlin KIDD Shelton Referring Physician/Provider: Evonnie Alm, MD Date: 07/04/2024 Duration: 23.44 mins Patient history: 53yo M with ams. EEG to evaluate for seizure Level of alertness: Awake, asleep AEDs during EEG study: GBP, Ativan  Technical aspects: This EEG study was done with scalp electrodes positioned according to the 10-20 International system of electrode placement. Electrical activity was reviewed with band pass  filter of 1-70Hz , sensitivity of 7 uV/mm, display speed of 26mm/sec with a 60Hz  notched filter applied as appropriate. EEG  data were recorded continuously and digitally stored.  Video monitoring was available and reviewed as appropriate. Description: The posterior dominant rhythm consists of 8Hz  activity of moderate voltage (25-35 uV) seen predominantly in posterior head regions, symmetric and reactive to eye opening and eye closing. Sleep was characterized by vertex waves, sleep spindles (12 to 14 Hz), maximal frontocentral region. Hyperventilation and photic stimulation were not performed.   IMPRESSION: This study is within normal limits. No seizures or epileptiform discharges were seen throughout the recording. A normal interictal EEG does not exclude the diagnosis of epilepsy. Priyanka MALVA Krebs   CT HEAD WO CONTRAST ( ) Result Date: 07/04/2024 CLINICAL DATA:  Fall altered EXAM: CT HEAD WITHOUT CONTRAST TECHNIQUE: Contiguous axial images were obtained from the base of the skull through the vertex without intravenous contrast. RADIATION DOSE REDUCTION: This exam was performed according to the departmental dose-optimization program which includes automated exposure control, adjustment of the mA and/or kV according to patient size and/or use of iterative reconstruction technique. COMPARISON:  CT brain 09/24/2021 FINDINGS: Brain: Mild motion degradation. No acute territorial infarction, hemorrhage or intracranial mass. Mild atrophy. Nonenlarged ventricles. Vascular: No hyperdense vessels.  Carotid vascular calcification Skull: No fracture Sinuses/Orbits: No acute finding. Other: None IMPRESSION: Mild motion degradation. No CT evidence for acute intracranial abnormality. Mild atrophy. Electronically Signed   By: Luke Bun M.D.   On: 07/04/2024 17:40   ECHOCARDIOGRAM COMPLETE Result Date: 07/04/2024    ECHOCARDIOGRAM REPORT   Patient Name:   Timothy [name] Date of Exam: 07/04/2024 Medical Rec #:   [id number]       Height:       72.0 in Accession #:    [id number]      Weight:       202.2 lb Date of Birth:  09-22-1970       BSA:          2.140 m Patient Age:    53 years        BP:           150/108 mmHg Patient Gender: M               HR:           97 bpm. Exam Location:  [facility] Procedure: 2D Echo, Cardiac Doppler, Color Doppler and Intracardiac            Opacification Agent (Both Spectral and Color Flow Doppler were            utilized during procedure). Indications:    Chest Pain R07.9  History:        Patient has prior history of Echocardiogram examinations, most                 recent 12/19/2020. CHF and Cardiomyopathy, CAD, Arrythmias:RBBB,                 Signs/Symptoms:Syncope; Risk Factors:Current Smoker,                 Hypertension and Dyslipidemia. Hx of ETOH abuse and                 Wolff-Parkinson-White (WPW) syndrome.  Sonographer:    Aida Pizza RCS Referring Phys: [phone] DAVID TAT IMPRESSIONS  1. No LV thrombus by Definity. Left ventricular ejection fraction, by estimation, is 50 to 55%. The left ventricle has low normal function. The left ventricle demonstrates regional wall motion abnormalities, basal septal hypokinesis. There is moderate left ventricular hypertrophy of the septal  segment. Left ventricular diastolic parameters are consistent with Grade I diastolic dysfunction (impaired relaxation). Elevated left ventricular end-diastolic pressure.  2. Right ventricular systolic function is normal. The right ventricular size is normal. Tricuspid regurgitation signal is inadequate for assessing PA pressure.  3. The mitral valve is abnormal. No evidence of mitral valve regurgitation. No evidence of mitral stenosis. Moderate mitral annular calcification.  4. The aortic valve has an indeterminant number of cusps. Aortic valve regurgitation is not visualized. No aortic stenosis is present. FINDINGS  Left Ventricle: Left ventricular ejection fraction, by estimation, is 50 to 55%. The left  ventricle has low normal function. The left ventricle demonstrates regional wall motion abnormalities. Definity contrast agent was given IV to delineate the left ventricular endocardial borders. Strain was performed and the global longitudinal strain is indeterminate. The left ventricular internal cavity size was normal in size. There is moderate left ventricular hypertrophy of the septal segment. Left ventricular diastolic parameters are consistent with Grade I diastolic dysfunction (impaired relaxation). Elevated left ventricular end-diastolic pressure. Right Ventricle: The right ventricular size is normal. No increase in right ventricular wall thickness. Right ventricular systolic function is normal. Tricuspid regurgitation signal is inadequate for assessing PA pressure. Left Atrium: Left atrial size was normal in size. Right Atrium: Right atrial size was normal in size. Pericardium: There is no evidence of pericardial effusion. Mitral Valve: The mitral valve is abnormal. Moderate mitral annular calcification. No evidence of mitral valve regurgitation. No evidence of mitral valve stenosis. Tricuspid Valve: The tricuspid valve is normal in structure. Tricuspid valve regurgitation is trivial. No evidence of tricuspid stenosis. Aortic Valve: The aortic valve has an indeterminant number of cusps. Aortic valve regurgitation is not visualized. No aortic stenosis is present. Pulmonic Valve: The pulmonic valve was grossly normal. Pulmonic valve regurgitation is not visualized. No evidence of pulmonic stenosis. Aorta: The aortic root is normal in size and structure. Venous: The inferior vena cava was not well visualized. IAS/Shunts: The interatrial septum was not well visualized. Additional Comments: 3D was performed not requiring image post processing on an independent workstation and was indeterminate.  LEFT VENTRICLE PLAX 2D LVIDd:         5.00 cm   Diastology LVIDs:         3.80 cm   LV e' medial:    4.85 cm/s LV PW:          1.10 cm   LV E/e' medial:  21.2 LV IVS:        1.40 cm   LV e' lateral:   7.34 cm/s LVOT diam:     2.20 cm   LV E/e' lateral: 14.0 LV SV:         57 LV SV Index:   27 LVOT Area:     3.80 cm  RIGHT VENTRICLE RV S prime:     10.70 cm/s TAPSE (M-mode): 2.5 cm LEFT ATRIUM             Index LA diam:        3.40 cm 1.59 cm/m LA Vol (A2C):   73.4 ml 34.29 ml/m LA Vol (A4C):   55.3 ml 25.84 ml/m LA Biplane Vol: 67.7 ml 31.63 ml/m  AORTIC VALVE LVOT Vmax:   85.70 cm/s LVOT Vmean:  60.000 cm/s LVOT VTI:    0.151 m  AORTA Ao Root diam: 4.00 cm MITRAL VALVE MV Area (PHT): 10.84 cm    SHUNTS MV Decel Time: 70 msec      Systemic VTI:  0.15 m MV E velocity: 103.00 cm/s  Systemic Diam: 2.20 cm Vishnu Priya Mallipeddi Electronically signed by Diannah Late Mallipeddi Signature Date/Time: 07/04/2024/3:33:16 PM    Final    Family History Reviewed and non-contributory, no pertinent history of problems with bleeding or anesthesia    Review of Systems No fevers or chills No numbness or tingling No chest pain No shortness of breath No bowel or bladder dysfunction No GI distress No headaches    OBJECTIVE  Vitals:Patient Vitals for the past 8 hrs:  BP Temp Temp src Pulse Resp SpO2  07/06/24 0401 (!) 139/90 97.8 F (36.6 C) Oral 85 17 97 %   General: Alert, no acute distress Cardiovascular: Warm extremities noted Respiratory: No cyanosis, no use of accessory musculature GI: No organomegaly, abdomen is soft and non-tender Skin: No lesions in the area of chief complaint other than those listed below in MSK exam.  Neurologic: Sensation intact distally save for the below mentioned MSK exam Psychiatric: Patient is competent for consent with normal mood and affect Lymphatic: No swelling obvious and reported other than the area involved in the exam below  Extremities  MLZ:[id number] and swelling throughout the right arm.  Sensation intact in the Axillary nerve distribution.  Fingers are warm and well  perfused.  Sensation intact in the right hand.  Pain with attempted motion in the right shoulder.    Test Results Imaging XR and the CT scan of the right shoulder demonstrates a comminuted right proximal humerus fracture.  Humeral head is locked on the posterior glenoid.   Labs cbc Recent Labs    07/04/24 0016 07/05/24 0335  WBC 10.3 7.0  HGB 12.0* 11.5*  HCT 35.8* 35.4*  PLT 274 209     Labs coag Recent Labs    07/05/24 1550  INR 1.1    Recent Labs    07/05/24 0335 07/06/24 0409  NA 127* 132*  K 3.0* 3.3*  CL 85* 92*  CO2 27 28  GLUCOSE 84 82  BUN 26* 25*  CREATININE 1.50* 1.28*  CALCIUM  8.9 9.1

## 2024-07-06 NOTE — Consult Note (Signed)
 ORTHOPAEDIC CONSULTATION  REQUESTING PHYSICIAN: Maree Bracken D, DO  ASSESSMENT AND PLAN: 53 y.o. male with the following: Comminuted right proximal humerus fracture  Patient has been admitted for medical reasons.  He is to remain hospitalized for the next 1-2 days until he is medically able to go home.  He has a complicated issue with the right shoulder.  In addition, he has multiple medical comorbidities.  I discussed this with the patient.  I do not think that his injury is amenable to open reduction and internal fixation with plate and screws.  As such, he would require complex reconstruction.  Given his age, and desire to return to previous level of function, he is interested in a reconstruction.  His care will continue to be discussed with the medical team, as well as anesthesia.  His medical comorbidities may not allow him to proceed with surgery at [facility]  - Weight Bearing Status/Activity: Non weightbearing right upper extremity, in a sling  - Additional recommended labs/tests: None  -VTE Prophylaxis: As needed  - Pain control: As needed  - Follow-up plan: To be determined  -Procedures: None plan currently  Chief Complaint: Right shoulder pain  HPI: Timothy [name] is a 53 y.o. male with complicated medical history, as indicated below.  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  He does not provide a direct story, and some this information has been gleaned from review of the chart.  He reportedly had a couple of falls within the last week, which has resulted in shoulder pain.  He is on chronic narcotics, and per the ED, his pain was so severe and he had run out of medicine, so he presented for evaluation.  He is right-hand dominant.  He states he has limited use of the left shoulder.  He noted a lot of bruising and swelling in the right hand.  He denies numbness and  tingling.  He notes that he is unable to move the right shoulder due to pain.  Past Medical History:  Diagnosis Date   Anxiety    Arthritis    CAD (coronary artery disease) 2007   50% stenosis small diagonal   Central sleep apnea    Chronic pain    since 1998 has been on chronic pain medication   Fatty liver    GERD (gastroesophageal reflux disease)    Hemorrhoids    Hepatomegaly 10/03/2014   HTN (hypertension)    Palpitations    Syncope    Tachycardia    Tobacco abuse    Tubular adenoma    Wolff-Parkinson-White (WPW) syndrome    says was cured with ablation   Past Surgical History:  Procedure Laterality Date   ADENOIDECTOMY     APPENDECTOMY     ATRIAL ABLATION SURGERY     AV NODE ABLATION     BIOPSY N/A 05/12/2013   Procedure: BIOPSY;  Surgeon: Lamar CHRISTELLA Hollingshead, MD;  Location: [facility] ORS;  Service: Endoscopy;  Laterality: N/A;   BIOPSY  12/20/2020   Procedure: BIOPSY;  Surgeon: Hollingshead Lamar CHRISTELLA, MD;  Location: [facility] ENDO SUITE;  Service: Endoscopy;;   BIOPSY  09/18/2022   Procedure: BIOPSY;  Surgeon: Eda Iha, MD;  Location: [facility] ENDOSCOPY;  Service: Gastroenterology;;   CARDIAC CATHETERIZATION N/A 05/08/2016   Procedure: Right/Left Heart Cath and Coronary Angiography;  Surgeon: Peter M Swaziland, MD;  Location: [facility] INVASIVE CV LAB;  Service: Cardiovascular;  Laterality: N/A;   CARDIAC ELECTROPHYSIOLOGY STUDY AND ABLATION     SA node ablation   COLONOSCOPY     age 89   COLONOSCOPY WITH PROPOFOL  N/A 05/12/2013   Dr. Shaaron- hemorrhoids, tubular adenoma   ESOPHAGEAL DILATION  12/20/2020   Procedure: ESOPHAGEAL DILATION;  Surgeon: Shaaron Lamar HERO, MD;  Location: [facility];  Service: Endoscopy;;   ESOPHAGEAL DILATION N/A 12/20/2020   Procedure: ESOPHAGEAL DILATION;  Surgeon: Shaaron Lamar HERO, MD;  Location: [facility];  Service: Endoscopy;  Laterality: N/A;   ESOPHAGOGASTRODUODENOSCOPY (EGD) WITH PROPOFOL  N/A 05/12/2013   Dr. Shaaron- hiatal hernia, erosive reflux esophagitis    ESOPHAGOGASTRODUODENOSCOPY (EGD) WITH PROPOFOL  N/A 12/20/2020   Procedure: ESOPHAGOGASTRODUODENOSCOPY (EGD) WITH PROPOFOL ;  Surgeon: Shaaron Lamar HERO, MD;  Location: [facility];  Service: Endoscopy;  Laterality: N/A;   ESOPHAGOGASTRODUODENOSCOPY (EGD) WITH PROPOFOL  N/A 09/18/2022   Procedure: ESOPHAGOGASTRODUODENOSCOPY (EGD) WITH PROPOFOL ;  Surgeon: Eda Iha, MD;  Location: [facility] ENDOSCOPY;  Service: Gastroenterology;  Laterality: N/A;   HAND SURGERY     HEMORRHOID BANDING  06/22/13   POLYPECTOMY N/A 05/12/2013   Procedure: POLYPECTOMY;  Surgeon: Lamar HERO Shaaron, MD;  Location: [facility] OR[facility];  Service: Endoscopy;  Laterality: N/A;   SHOULDER SURGERY     Social History   Socioeconomic History   Marital status: Married    Spouse name: Not on file   Number of children: 2   Years of education: Not on file   Highest education level: Not on file  Occupational History   Occupation: unemployed    Employer: UNEMPLOYED  Tobacco Use   Smoking status: Every Day    Current packs/day: 0.50    Average packs/day: 0.5 packs/day for 25.0 years (12.5 ttl pk-yrs)    Types: Cigarettes   Smokeless tobacco: Never  Vaping Use   Vaping status: Never Used  Substance and Sexual Activity   Alcohol  use: Yes    Comment: daily; 12/18/20-5 days/week, 6 drinks at time   Drug use: Yes    Types: Marijuana    Comment: hx marijuana use   Sexual activity: Yes    Birth control/protection: None  Other Topics Concern   Not on file  Social History Narrative   Not on file   Social Drivers of Health   Financial Resource Strain: Not on file  Food Insecurity: No Food Insecurity (07/04/2024)   Hunger Vital Sign    Worried About Running Out of Food in the Last Year: Never true    Ran Out of Food in the Last Year: Never true  Transportation Needs: No Transportation Needs (07/04/2024)   PRAPARE - Administrator, Civil Service (Medical): No    Lack of Transportation (Non-Medical): No  Physical Activity: Not on  file  Stress: Not on file  Social Connections: Not on file   Family History  Problem Relation Age of Onset   Coronary artery disease Father        cabg   Colon cancer Father        age 52, chemo/surgery   Breast cancer Mother    Hypertension Mother    Allergies  Allergen Reactions   Morphine Other (See Comments)    Makes overly sleepy   Prior to Admission medications   Medication Sig Start Date End Date Taking? Authorizing Provider  albuterol  (VENTOLIN  HFA) 108 (90 Base) MCG/ACT inhaler Inhale 2 puffs into the lungs every 6 (six) hours as needed for wheezing or shortness of breath.  Yes [provider]  carvedilol  (COREG ) 12.5 MG tablet Take 6.25-12.5 mg by mouth See admin instructions. 6.25mg  in the morning and 12.5mg  at bedtime   Yes [provider]  celecoxib (CELEBREX) 200 MG capsule Take 200 mg by mouth daily. 06/03/24  Yes [provider]  Cholecalciferol  (VITAMIN D3) 25 MCG (1000 UT) CAPS Take 2 capsules by mouth every evening.   Yes [provider]  cloNIDine  (CATAPRES  - DOSED IN MG/24 HR) 0.3 mg/24hr patch Place 0.3 mg onto the skin once a week. 04/14/24  Yes [provider]  cyclobenzaprine (FLEXERIL) 10 MG tablet Take 10 mg by mouth 2 (two) times daily. 06/03/24  Yes [provider]  folic acid  (FOLVITE ) 1 MG tablet Take 1 tablet (1 mg total) by mouth daily. 12/21/20  Yes Emokpae, Courage, MD  furosemide  (LASIX ) 20 MG tablet Take 1 tablet (20 mg total) by mouth daily. Patient taking differently: Take 20-40 mg by mouth daily. 05/13/23  Yes Tat, Alm, MD  gabapentin  (NEURONTIN ) 100 MG capsule Take 2 capsules (200 mg total) by mouth 3 (three) times daily. Patient taking differently: Take 200 mg by mouth 2 (two) times daily. 05/12/23  Yes Tat, Alm, MD  hydrOXYzine  (ATARAX /VISTARIL ) 25 MG tablet Take 1 tablet (25 mg total) by mouth 3 (three) times daily as needed for anxiety or nausea. 12/20/20  Yes Emokpae, Courage, MD  lactulose   (CHRONULAC ) 10 GM/15ML solution Take 45 mLs (30 g total) by mouth 2 (two) times daily. Patient taking differently: Take 30 g by mouth 2 (two) times daily as needed for moderate constipation or mild constipation. 05/12/23  Yes Tat, Alm, MD  lubiprostone (AMITIZA) 8 MCG capsule Take 8 mcg by mouth daily as needed for constipation. 05/23/23  Yes [provider]  oxyCODONE  (ROXICODONE ) 15 MG immediate release tablet Take 1 tablet (15 mg total) by mouth every 6 (six) hours as needed for pain. Patient taking differently: Take 15 mg by mouth in the morning, at noon, in the evening, and at bedtime. 05/12/23  Yes Tat, Alm, MD  pantoprazole  (PROTONIX ) 40 MG tablet Take 40 mg by mouth 2 (two) times daily. 11/06/22  Yes [provider]  potassium chloride  SA (KLOR-CON  M) 20 MEQ tablet Take 2 tablets (40 mEq total) by mouth daily. Patient taking differently: Take 40 mEq by mouth every other day. 05/13/23  Yes Tat, Alm, MD  spironolactone  (ALDACTONE ) 50 MG tablet Take 1 tablet (50 mg total) by mouth daily. 05/13/23  Yes Tat, Alm, MD  thiamine  (VITAMIN B-1) 100 MG tablet Take 1 tablet (100 mg total) by mouth daily. 09/21/22  Yes Dennise Lavada POUR, MD  traZODone  (DESYREL ) 100 MG tablet Take 1 tablet (100 mg total) by mouth at bedtime. Patient taking differently: Take 100 mg by mouth at bedtime as needed for sleep. 05/12/23   Evonnie Alm, MD   EEG adult Result Date: 07/05/2024 Shelton Arlin KIDD, MD     07/05/2024  8:26 AM Patient Name: Timothy [name] MRN: [id number] Epilepsy Attending: Arlin KIDD Shelton Referring Physician/Provider: Evonnie Alm, MD Date: 07/04/2024 Duration: 23.44 mins Patient history: 53yo M with ams. EEG to evaluate for seizure Level of alertness: Awake, asleep AEDs during EEG study: GBP, Ativan  Technical aspects: This EEG study was done with scalp electrodes positioned according to the 10-20 International system of electrode placement. Electrical activity was reviewed with band pass  filter of 1-70Hz , sensitivity of 7 uV/mm, display speed of 26mm/sec with a 60Hz  notched filter applied as appropriate. EEG  data were recorded continuously and digitally stored.  Video monitoring was available and reviewed as appropriate. Description: The posterior dominant rhythm consists of 8Hz  activity of moderate voltage (25-35 uV) seen predominantly in posterior head regions, symmetric and reactive to eye opening and eye closing. Sleep was characterized by vertex waves, sleep spindles (12 to 14 Hz), maximal frontocentral region. Hyperventilation and photic stimulation were not performed.   IMPRESSION: This study is within normal limits. No seizures or epileptiform discharges were seen throughout the recording. A normal interictal EEG does not exclude the diagnosis of epilepsy. Priyanka MALVA Krebs   CT HEAD WO CONTRAST ( ) Result Date: 07/04/2024 CLINICAL DATA:  Fall altered EXAM: CT HEAD WITHOUT CONTRAST TECHNIQUE: Contiguous axial images were obtained from the base of the skull through the vertex without intravenous contrast. RADIATION DOSE REDUCTION: This exam was performed according to the departmental dose-optimization program which includes automated exposure control, adjustment of the mA and/or kV according to patient size and/or use of iterative reconstruction technique. COMPARISON:  CT brain 09/24/2021 FINDINGS: Brain: Mild motion degradation. No acute territorial infarction, hemorrhage or intracranial mass. Mild atrophy. Nonenlarged ventricles. Vascular: No hyperdense vessels.  Carotid vascular calcification Skull: No fracture Sinuses/Orbits: No acute finding. Other: None IMPRESSION: Mild motion degradation. No CT evidence for acute intracranial abnormality. Mild atrophy. Electronically Signed   By: Luke Bun M.D.   On: 07/04/2024 17:40   ECHOCARDIOGRAM COMPLETE Result Date: 07/04/2024    ECHOCARDIOGRAM REPORT   Patient Name:   GEREMY RISTER Date of Exam: 07/04/2024 Medical Rec #:   [id number]       Height:       72.0 in Accession #:    [id number]      Weight:       202.2 lb Date of Birth:  09-22-1970       BSA:          2.140 m Patient Age:    53 years        BP:           150/108 mmHg Patient Gender: M               HR:           97 bpm. Exam Location:  [facility] Procedure: 2D Echo, Cardiac Doppler, Color Doppler and Intracardiac            Opacification Agent (Both Spectral and Color Flow Doppler were            utilized during procedure). Indications:    Chest Pain R07.9  History:        Patient has prior history of Echocardiogram examinations, most                 recent 12/19/2020. CHF and Cardiomyopathy, CAD, Arrythmias:RBBB,                 Signs/Symptoms:Syncope; Risk Factors:Current Smoker,                 Hypertension and Dyslipidemia. Hx of ETOH abuse and                 Wolff-Parkinson-White (WPW) syndrome.  Sonographer:    Aida Pizza RCS Referring Phys: [phone] DAVID TAT IMPRESSIONS  1. No LV thrombus by Definity. Left ventricular ejection fraction, by estimation, is 50 to 55%. The left ventricle has low normal function. The left ventricle demonstrates regional wall motion abnormalities, basal septal hypokinesis. There is moderate left ventricular hypertrophy of the septal  segment. Left ventricular diastolic parameters are consistent with Grade I diastolic dysfunction (impaired relaxation). Elevated left ventricular end-diastolic pressure.  2. Right ventricular systolic function is normal. The right ventricular size is normal. Tricuspid regurgitation signal is inadequate for assessing PA pressure.  3. The mitral valve is abnormal. No evidence of mitral valve regurgitation. No evidence of mitral stenosis. Moderate mitral annular calcification.  4. The aortic valve has an indeterminant number of cusps. Aortic valve regurgitation is not visualized. No aortic stenosis is present. FINDINGS  Left Ventricle: Left ventricular ejection fraction, by estimation, is 50 to 55%. The left  ventricle has low normal function. The left ventricle demonstrates regional wall motion abnormalities. Definity contrast agent was given IV to delineate the left ventricular endocardial borders. Strain was performed and the global longitudinal strain is indeterminate. The left ventricular internal cavity size was normal in size. There is moderate left ventricular hypertrophy of the septal segment. Left ventricular diastolic parameters are consistent with Grade I diastolic dysfunction (impaired relaxation). Elevated left ventricular end-diastolic pressure. Right Ventricle: The right ventricular size is normal. No increase in right ventricular wall thickness. Right ventricular systolic function is normal. Tricuspid regurgitation signal is inadequate for assessing PA pressure. Left Atrium: Left atrial size was normal in size. Right Atrium: Right atrial size was normal in size. Pericardium: There is no evidence of pericardial effusion. Mitral Valve: The mitral valve is abnormal. Moderate mitral annular calcification. No evidence of mitral valve regurgitation. No evidence of mitral valve stenosis. Tricuspid Valve: The tricuspid valve is normal in structure. Tricuspid valve regurgitation is trivial. No evidence of tricuspid stenosis. Aortic Valve: The aortic valve has an indeterminant number of cusps. Aortic valve regurgitation is not visualized. No aortic stenosis is present. Pulmonic Valve: The pulmonic valve was grossly normal. Pulmonic valve regurgitation is not visualized. No evidence of pulmonic stenosis. Aorta: The aortic root is normal in size and structure. Venous: The inferior vena cava was not well visualized. IAS/Shunts: The interatrial septum was not well visualized. Additional Comments: 3D was performed not requiring image post processing on an independent workstation and was indeterminate.  LEFT VENTRICLE PLAX 2D LVIDd:         5.00 cm   Diastology LVIDs:         3.80 cm   LV e' medial:    4.85 cm/s LV PW:          1.10 cm   LV E/e' medial:  21.2 LV IVS:        1.40 cm   LV e' lateral:   7.34 cm/s LVOT diam:     2.20 cm   LV E/e' lateral: 14.0 LV SV:         57 LV SV Index:   27 LVOT Area:     3.80 cm  RIGHT VENTRICLE RV S prime:     10.70 cm/s TAPSE (M-mode): 2.5 cm LEFT ATRIUM             Index LA diam:        3.40 cm 1.59 cm/m LA Vol (A2C):   73.4 ml 34.29 ml/m LA Vol (A4C):   55.3 ml 25.84 ml/m LA Biplane Vol: 67.7 ml 31.63 ml/m  AORTIC VALVE LVOT Vmax:   85.70 cm/s LVOT Vmean:  60.000 cm/s LVOT VTI:    0.151 m  AORTA Ao Root diam: 4.00 cm MITRAL VALVE MV Area (PHT): 10.84 cm    SHUNTS MV Decel Time: 70 msec      Systemic VTI:  0.15 m MV E velocity: 103.00 cm/s  Systemic Diam: 2.20 cm Vishnu Priya Mallipeddi Electronically signed by Diannah Late Mallipeddi Signature Date/Time: 07/04/2024/3:33:16 PM    Final    Family History Reviewed and non-contributory, no pertinent history of problems with bleeding or anesthesia    Review of Systems No fevers or chills No numbness or tingling No chest pain No shortness of breath No bowel or bladder dysfunction No GI distress No headaches    OBJECTIVE  Vitals:Patient Vitals for the past 8 hrs:  BP Temp Temp src Pulse Resp SpO2  07/06/24 0401 (!) 139/90 97.8 F (36.6 C) Oral 85 17 97 %   General: Alert, no acute distress Cardiovascular: Warm extremities noted Respiratory: No cyanosis, no use of accessory musculature GI: No organomegaly, abdomen is soft and non-tender Skin: No lesions in the area of chief complaint other than those listed below in MSK exam.  Neurologic: Sensation intact distally save for the below mentioned MSK exam Psychiatric: Patient is competent for consent with normal mood and affect Lymphatic: No swelling obvious and reported other than the area involved in the exam below  Extremities  MLZ:[id number] and swelling throughout the right arm.  Sensation intact in the Axillary nerve distribution.  Fingers are warm and well  perfused.  Sensation intact in the right hand.  Pain with attempted motion in the right shoulder.    Test Results Imaging XR and the CT scan of the right shoulder demonstrates a comminuted right proximal humerus fracture.  Humeral head is locked on the posterior glenoid.   Labs cbc Recent Labs    07/04/24 0016 07/05/24 0335  WBC 10.3 7.0  HGB 12.0* 11.5*  HCT 35.8* 35.4*  PLT 274 209     Labs coag Recent Labs    07/05/24 1550  INR 1.1    Recent Labs    07/05/24 0335 07/06/24 0409  NA 127* 132*  K 3.0* 3.3*  CL 85* 92*  CO2 27 28  GLUCOSE 84 82  BUN 26* 25*  CREATININE 1.50* 1.28*  CALCIUM  8.9 9.1

## 2024-07-06 NOTE — Evaluation (Addendum)
 Occupational Therapy Evaluation Patient Details Name: Timothy [name] MRN: [id number] DOB: 09-13-1971 Today's Date: 07/06/2024   History of Present Illness   Timothy [name] is a 53 year old male with history of HTN, hyperlipidemia opioid dependent, chronic HFmrEF, Barrett esophagus, PUD, WPW status post ablation, NICM, alcohol  abuse, and Etoh liver cirrhosis presenting with severe right arm pain.     The patient is a difficult historian often giving tangential answers with conflicting chronological timelines.     In general, the patient believes that he sustained a mechanical fall about 10 days prior to this admission while playing with his son.  He stated that he tripped over an ottoman and fall onto his right side.  He stated that he had some right arm pain at that time, but it had resolved after several minutes.  About 2 days later, apparently the patient woke up after sleeping in his recliner.  Apparently his wife described the patient as having a night terror.  Apparently the patient was confused and fell onto his right side at that time after which he had significant arm pain.  He stated that he continued to have some intermittent arm pain throughout the week but did not think much of it.  However, he noted that on 07/02/2024 there was significant swelling and edema about his right upper arm which had worsened over the next 48 hours prompting him to come to the emergency department for further evaluation.  He had denied any interim syncopal episodes, but had complained of some dizziness.  He states he has had some subjective fevers and chills.  As he was preparing to come to the emergency department he noticed some pins and needle sensation on his whole face bilaterally down through his neck bilaterally and down to his chest.  He had complained of sharp chest pain at that time lasting about 2 hours.  He had some shortness of breath.  At the time of my evaluation he is pain-free with regard to his  chest pain and dysesthesia on his face and neck.  He denies any visual disturbance.  He denies any focal extremity weakness aside from his right arm.  He denies dysarthria.     Clinical Impressions Pt agreeable to OT and PT co-evaluation. Pt assisted to don R UE sling today and educated to not bear weight through R UE. Pt requires mod to max A for most lower body ADL tasks at this time and moderate assist for upper body ADL's like dressing and bathing per observation and clinical judgement. Pt demonstrated very slow and labored movement to ambulate to the toilet leaning on IV pole with PT student assisting with mostly CGA. BSC recommended based on pt's difficulty lowering to toilet and labored movement to the toilet. Pt reports his wife can provided the needed level of support at home. L UE noted to be generally weak as well. Pt will benefit from continued OT in the hospital to increase strength, balance, and endurance for safe ADL's.        If plan is discharge home, recommend the following:   A little help with walking and/or transfers;A lot of help with bathing/dressing/bathroom;Assistance with cooking/housework;Assist for transportation;Help with stairs or ramp for entrance     Functional Status Assessment   Patient has had a recent decline in their functional status and demonstrates the ability to make significant improvements in function in a reasonable and predictable amount of time.     Equipment Recommendations   BSC/3in1  Precautions/Restrictions   Precautions Precautions: Fall;Shoulder Type of Shoulder Precautions: no UE WB Shoulder Interventions: Shoulder sling/immobilizer Recall of Precautions/Restrictions: Intact Required Braces or Orthoses: Sling Restrictions Weight Bearing Restrictions Per Provider Order: Yes Other Position/Activity Restrictions: Likely non-weight bearing, no official status listed.     Mobility Bed Mobility Overal bed  mobility: Needs Assistance Bed Mobility: Supine to Sit     Supine to sit: Contact guard, HOB elevated     General bed mobility comments: labored movement ; HOB elevated significantly as the pt sleeps at home.    Transfers Overall transfer level: Needs assistance Equipment used: 1 person hand held assist Transfers: Sit to/from Stand Sit to Stand: Min assist, Contact guard assist           General transfer comment: Leaning on IV pole during sit to stand and ambulation. Very slow and labored movement.      Balance Overall balance assessment: Needs assistance Sitting-balance support: Feet supported, Single extremity supported Sitting balance-Leahy Scale: Fair Sitting balance - Comments: fair/good for sitting balance   Standing balance support: During functional activity, Reliant on assistive device for balance Standing balance-Leahy Scale: Fair Standing balance comment: Using IV pole and wall for support in standing.                           ADL either performed or assessed with clinical judgement   ADL Overall ADL's : Needs assistance/impaired     Grooming: Moderate assistance;Sitting   Upper Body Bathing: Moderate assistance;Sitting   Lower Body Bathing: Maximal assistance;Sitting/lateral leans   Upper Body Dressing : Moderate assistance;Sitting   Lower Body Dressing: Maximal assistance;Sitting/lateral leans;Moderate assistance Lower Body Dressing Details (indicate cue type and reason): Assist to don shoes seated at EOB. Toilet Transfer: Contact guard assist;Minimal assistance;Ambulation Toilet Transfer Details (indicate cue type and reason): EOB to toilet with much extended time and labored movement. Toileting- Clothing Manipulation and Hygiene: Moderate assistance;Maximal assistance;Sitting/lateral lean;Sit to/from stand Toileting - Clothing Manipulation Details (indicate cue type and reason): Pt able to pull down L side of underwear, assist to pull R  side down. NT assisted with the rest of toileting after pt used the bathroom. Likely needing mod to max A overall at this time.     Functional mobility during ADLs: Minimal assistance;Contact guard assist (Leaning on IV pole and wall to ambulate to toilet.)       Vision Baseline Vision/History:  (Has vision deficits but does not have glasses.) Ability to See in Adequate Light: 1 Impaired Patient Visual Report: No change from baseline Vision Assessment?:  (Baseline deficits)     Perception Perception: Not tested       Praxis Praxis: Not tested       Pertinent Vitals/Pain Pain Assessment Pain Assessment: 0-10 Pain Score: 10-Worst pain ever Pain Location: R. Shoulder, and Back Pain Descriptors / Indicators: Other (Comment) (popping; feeling like a rubber band popping) Pain Intervention(s): Limited activity within patient's tolerance, Monitored during session, Repositioned     Extremity/Trunk Assessment Upper Extremity Assessment Upper Extremity Assessment: Right hand dominant;RUE deficits/detail;LUE deficits/detail RUE Deficits / Details: Assited to don sling today. Educated not to put weight on R UE. LUE Deficits / Details: 3+/5 shoulder flexion; generally weak.   Lower Extremity Assessment Lower Extremity Assessment: Defer to PT evaluation   Cervical / Trunk Assessment Cervical / Trunk Assessment: Normal   Communication Communication Communication: No apparent difficulties   Cognition Arousal: Alert Behavior During Therapy: [facility] for tasks  assessed/performed Cognition: No apparent impairments                               Following commands: Intact       Cueing  General Comments   Cueing Techniques: Verbal cues;Tactile cues                 Home Living Family/patient expects to be discharged to:: Private residence Living Arrangements: Spouse/significant other;Children Available Help at Discharge: Family;Available PRN/intermittently Type  of Home: House Home Access: Stairs to enter [employer] of Steps: 7 Entrance Stairs-Rails: Right;Left Home Layout: One level     Bathroom Shower/Tub: Tub/shower unit;Other (comment)   Bathroom Toilet: Standard (elongated) Bathroom Accessibility: No   Home Equipment: Agricultural consultant (2 wheels);Cane - single point;Tub bench   Additional Comments: Pt. spouse helps with physically needs in home      Prior Functioning/Environment Prior Level of Function : Needs assist       Physical Assist : Mobility (physical);ADLs (physical) Mobility (physical): Gait;Transfers;Stairs ADLs (physical): IADLs;Toileting;Dressing;Bathing Mobility Comments: Pt. is househld ambulator with cane or RW, diffculty with mobility. ADLs Comments: Pt reports PRN assist for ADL's depending on how he is doing that day. Pt can complete ADL's on his own at times.    OT Problem List: Decreased strength;Decreased range of motion;Decreased activity tolerance;Impaired balance (sitting and/or standing);Impaired UE functional use;Pain   OT Treatment/Interventions: Self-care/ADL training;Therapeutic activities;DME and/or AE instruction;Patient/family education;Balance training      OT Goals(Current goals can be found in the care plan section)   Acute Rehab OT Goals Patient Stated Goal: Return home OT Goal Formulation: With patient Time For Goal Achievement: 07/20/24 Potential to Achieve Goals: Good   OT Frequency:  Min 2X/week    Co-evaluation PT/OT/SLP Co-Evaluation/Treatment: Yes Reason for Co-Treatment: To address functional/ADL transfers PT goals addressed during session: Mobility/safety with mobility;Strengthening/ROM OT goals addressed during session: ADL's and self-care                       End of Session Equipment Utilized During Treatment: Gait belt  Activity Tolerance: Patient tolerated treatment well Patient left: Other (comment) (BSC with instruction to pull wall bell when  done with bowel movement for NT to assist.)  OT Visit Diagnosis: Unsteadiness on feet (R26.81);Other abnormalities of gait and mobility (R26.89);Muscle weakness (generalized) (M62.81);History of falling (Z91.81)                Time: 9180-9146 OT Time Calculation (min): 34 min Charges:  OT General Charges $OT Visit: 1 Visit OT Evaluation $OT Eval Low Complexity: 1 Low  Kaliegh Willadsen OT, MOT   Jayson Person 07/06/2024, 1:13 PM

## 2024-07-06 NOTE — Progress Notes (Signed)
 PROGRESS NOTE    Timothy [name]  FMW:[id number] DOB: 12-22-1970 DOA: 07/03/2024 PCP: Bertell Satterfield, MD   Brief Narrative:    53 year old male with history of HTN, hyperlipidemia opioid dependent, chronic HFmrEF, Barrett esophagus, PUD, WPW status post ablation, NICM, alcohol  abuse, and Etoh liver cirrhosis presenting with severe right arm pain.   The patient is a difficult historian often giving tangential answers with conflicting chronological timelines.   In general, the patient believes that he sustained a mechanical fall about 10 days prior to this admission while playing with his son.  He stated that he tripped over an ottoman and fall onto his right side.  He stated that he had some right arm pain at that time, but it had resolved after several minutes. About 2 days later, apparently the patient woke up after sleeping in his recliner.  Apparently his wife described the patient as having a night terror.  Apparently the patient was confused and fell onto his right side at that time after which he had significant arm pain.  He stated that he continued to have some intermittent arm pain throughout the week but did not think much of it. However, he noted that on 07/02/2024 there was significant swelling and edema about his right upper arm which had worsened over the next 48 hours prompting him to come to the emergency department for further evaluation. He had denied any interim syncopal episodes, but had complained of some dizziness. He states he has had some subjective fevers and chills.  As he was preparing to come to the emergency department he noticed some pins and needle sensation on his whole face bilaterally down through his neck bilaterally and down to his chest.  He had complained of sharp chest pain at that time lasting about 2 hours.  He had some shortness of breath.  At the time of my evaluation he is pain-free with regard to his chest pain and dysesthesia on his face and neck.   He denies any visual disturbance.  He denies any focal extremity weakness aside from his right arm.  He denies dysarthria.   Notably, the patient continues to drink alcohol  at least 4 days/week.  He states that he drinks  a couple shots of vodka at each sitting.  He continues to smoke 1 pack/day x 40 years.  He denies illicit drug use. He does endorse several episodes of emesis prior to coming to the hospital without any blood.  He also endorsed having some loose stools but denied any hematochezia or melena.  He denies any abdominal pain, dysuria, hematuria.  He denies any coughing or hemoptysis. In addition, the patient states that he has been not completely compliant with his antihypertensive medications nor his lactulose .  Patient was admitted for comminuted fracture of the right humerus and was seen by orthopedics now with recommendations for repair at [facility] given his medical complexity.  He was also noted to have acute metabolic encephalopathy with hepatic encephalopathy that appears to have improved with use of lactulose .  He was seen by GI due to his intractable nausea and vomiting, but overall appears to be improved and is now asking for diet.  Assessment & Plan:   Principal Problem:   Closed comminuted right humeral fracture Active Problems:   Alcoholic cirrhosis (HCC)   Essential hypertension   Malignant hypertension   ETOH abuse   Chronic combined systolic (congestive) and diastolic (congestive) heart failure (HCC)   Nausea and vomiting   Alcohol  abuse  Uncontrolled pain  Assessment and Plan:   Comminuted fracture of the right humerus - Orthopedic surgery, Dr. Onesimo consulted with recommendations to transfer to [facility]  Dr. Cristy to perform operative repair in AM, keep n.p.o. after midnight - Judicious opioids   Acute metabolic encephalopathy/hepatic encephalopathy-resolved - The patient appears a little bit slow with his responses with difficult history - EEG  neg - Ammonia now within normal limits - B12--631 - TSH--5.290 - CT brain--neg - Folic acid --4.9>>>supplement - Obtain UA--no pyuria - UDS +THC - 10/22, appears to be at baseline   Intractable vomiting-improved -?cannabis hyperemesis>>UDS shows +THC -pt has hx of Barrett's - Appreciate GI evaluation with no further recommendations at this time.  May consider reevaluation as needed after surgery for right humeral fracture as noted above. -continue pantoprazole  bid - Now asking for diet and will start soft diet   AKI-improved - Baseline creatinine 0.9-1.0 - Serum creatinine up to 1.50 - Secondary to volume depletion - Hold further IV fluids and check labs in a.m.   Alcoholic liver cirrhosis - Patient continues to drink - Restarted carvedilol  - Restarted lactulose >>increase to TID as pt states he has not had any BMs since admission - Holding furosemide  and spironolactone  temporarily   Diarrhea-resolved - Check C. Difficile--no BM to collect  - Stool pathogen panel--no BM to collect   Essential hypertension - Restart carvedilol    Chronic pain syndrome/opioid dependence - PDMP reviewed - Oxycodone  15 mg, #125, last refill 06/03/2024 - Ambien  10 mg, #30, last refill 04/20/2024   Alcohol  abuse - Alcohol  withdrawal protocol - Last alcoholic beverage 07/03/2024   Tobacco abuse - Tobacco cessation discussed   Chronic HFmrEF - 12/19/2020 echo EF 45-50%, grade 1 DD, normal RVF - clinically compensated   Hyponatremia - Secondary to liver cirrhosis - Monitor BMP   Hypokalemia - repleting    DVT prophylaxis:Lovenox  Code Status: Full Family Communication: Father at bedside 10/22 Disposition Plan: Transfer to [facility] for [facility] management Status is: Inpatient Remains inpatient appropriate because: Need for inpatient procedure.   Consultants:  Orthopedics GI  Procedures:  None  Antimicrobials:  None   Subjective: Patient seen and evaluated today with  no new acute complaints or concerns. No acute concerns or events noted overnight.  Objective: Vitals:   07/05/24 1335 07/05/24 1709 07/05/24 2001 07/06/24 0401  BP: (!) 141/98 (!) 142/93 (!) 139/96 (!) 139/90  Pulse: 97 93 94 85  Resp: 18 18 18 17   Temp: (!) 97.5 F (36.4 C) 98.1 F (36.7 C) 98.2 F (36.8 C) 97.8 F (36.6 C)  TempSrc: Oral Oral Oral Oral  SpO2: 98% 96% 96% 97%  Weight:      Height:        Intake/Output Summary (Last 24 hours) at 07/06/2024 1121 Last data filed at 07/06/2024 0306 Gross per 24 hour  Intake 1260.51 ml  Output 600 ml  Net 660.51 ml   Filed Weights   07/03/24 2327  Weight: 91.7 kg    Examination:  General exam: Appears calm and comfortable  Respiratory system: Clear to auscultation. Respiratory effort normal. Cardiovascular system: S1 & S2 heard, RRR.  Gastrointestinal system: Abdomen is soft Central nervous system: Alert and awake Extremities: Right upper extremity in sling Skin: No significant lesions noted Psychiatry: Flat affect.    Data Reviewed: I have personally reviewed following labs and imaging studies  CBC: Recent Labs  Lab 07/04/24 0016 07/05/24 0335  WBC 10.3 7.0  NEUTROABS 7.1  --   HGB 12.0*  11.5*  HCT 35.8* 35.4*  MCV 83.4 86.1  PLT 274 209   Basic Metabolic Panel: Recent Labs  Lab 07/04/24 0016 07/04/24 1130 07/05/24 0335 07/06/24 0409  NA 133*  --  127* 132*  K 3.6  --  3.0* 3.3*  CL 93*  --  85* 92*  CO2 22  --  27 28  GLUCOSE 128*  --  84 82  BUN 16  --  26* 25*  CREATININE 1.16  --  1.50* 1.28*  CALCIUM  8.5*  --  8.9 9.1  MG  --  1.6* 1.9 2.1  PHOS  --  2.1* 2.2* 4.1   GFR: Estimated Creatinine Clearance: 71.6 mL/min (A) (by C-G formula based on SCr of 1.28 mg/dL (H)). Liver Function Tests: Recent Labs  Lab 07/04/24 0016 07/05/24 0335 07/06/24 0409  AST 82* 65* 63*  ALT 33 30 27  ALKPHOS 166* 148* 151*  BILITOT 1.9* 2.6* 2.4*  PROT 7.7 7.1 6.9  ALBUMIN  4.0 3.8 3.6   No results  for input(s): LIPASE, AMYLASE in the last 168 hours. Recent Labs  Lab 07/04/24 1130 07/05/24 0948 07/06/24 0409  AMMONIA 72* 51* 28   Coagulation Profile: Recent Labs  Lab 07/05/24 1550  INR 1.1   Cardiac Enzymes: Recent Labs  Lab 07/04/24 0016  CKTOTAL 255   BNP (last 3 results) Recent Labs    07/04/24 0016  PROBNP 1,272.0*   HbA1C: No results for input(s): HGBA1C in the last 72 hours. CBG: No results for input(s): GLUCAP in the last 168 hours. Lipid Profile: No results for input(s): CHOL, HDL, LDLCALC, TRIG, CHOLHDL, LDLDIRECT in the last 72 hours. Thyroid  Function Tests: Recent Labs    07/04/24 1130  TSH 5.290*  FREET4 1.14*   Anemia Panel: Recent Labs    07/04/24 1130  VITAMINB12 631  FOLATE 4.9*   Sepsis Labs: No results for input(s): PROCALCITON, LATICACIDVEN in the last 168 hours.  No results found for this or any previous visit (from the past 240 hours).       Radiology Studies: EEG adult Result Date: 07/05/2024 Shelton Arlin KIDD, MD     07/05/2024  8:26 AM Patient Name: Timothy [name] MRN: [id number] Epilepsy Attending: Arlin KIDD Shelton Referring Physician/Provider: Evonnie Lenis, MD Date: 07/04/2024 Duration: 23.44 mins Patient history: 53yo M with ams. EEG to evaluate for seizure Level of alertness: Awake, asleep AEDs during EEG study: GBP, Ativan  Technical aspects: This EEG study was done with scalp electrodes positioned according to the 10-20 International system of electrode placement. Electrical activity was reviewed with band pass filter of 1-70Hz , sensitivity of 7 uV/mm, display speed of 16mm/sec with a 60Hz  notched filter applied as appropriate. EEG data were recorded continuously and digitally stored.  Video monitoring was available and reviewed as appropriate. Description: The posterior dominant rhythm consists of 8Hz  activity of moderate voltage (25-35 uV) seen predominantly in posterior head regions, symmetric and  reactive to eye opening and eye closing. Sleep was characterized by vertex waves, sleep spindles (12 to 14 Hz), maximal frontocentral region. Hyperventilation and photic stimulation were not performed.   IMPRESSION: This study is within normal limits. No seizures or epileptiform discharges were seen throughout the recording. A normal interictal EEG does not exclude the diagnosis of epilepsy. Priyanka KIDD Shelton   CT HEAD WO CONTRAST ( ) Result Date: 07/04/2024 CLINICAL DATA:  Fall altered EXAM: CT HEAD WITHOUT CONTRAST TECHNIQUE: Contiguous axial images were obtained from the base of the skull through the vertex without  intravenous contrast. RADIATION DOSE REDUCTION: This exam was performed according to the departmental dose-optimization program which includes automated exposure control, adjustment of the mA and/or kV according to patient size and/or use of iterative reconstruction technique. COMPARISON:  CT brain 09/24/2021 FINDINGS: Brain: Mild motion degradation. No acute territorial infarction, hemorrhage or intracranial mass. Mild atrophy. Nonenlarged ventricles. Vascular: No hyperdense vessels.  Carotid vascular calcification Skull: No fracture Sinuses/Orbits: No acute finding. Other: None IMPRESSION: Mild motion degradation. No CT evidence for acute intracranial abnormality. Mild atrophy. Electronically Signed   By: Luke Bun M.D.   On: 07/04/2024 17:40   ECHOCARDIOGRAM COMPLETE Result Date: 07/04/2024    ECHOCARDIOGRAM REPORT   Patient Name:   LINDON KIEL Date of Exam: 07/04/2024 Medical Rec #:  [id number]       Height:       72.0 in Accession #:    [id number]      Weight:       202.2 lb Date of Birth:  17-May-1971       BSA:          2.140 m Patient Age:    53 years        BP:           150/108 mmHg Patient Gender: M               HR:           97 bpm. Exam Location:  [facility] Procedure: 2D Echo, Cardiac Doppler, Color Doppler and Intracardiac            Opacification Agent (Both Spectral and  Color Flow Doppler were            utilized during procedure). Indications:    Chest Pain R07.9  History:        Patient has prior history of Echocardiogram examinations, most                 recent 12/19/2020. CHF and Cardiomyopathy, CAD, Arrythmias:RBBB,                 Signs/Symptoms:Syncope; Risk Factors:Current Smoker,                 Hypertension and Dyslipidemia. Hx of ETOH abuse and                 Wolff-Parkinson-White (WPW) syndrome.  Sonographer:    Aida Pizza RCS Referring Phys: [phone] DAVID TAT IMPRESSIONS  1. No LV thrombus by Definity. Left ventricular ejection fraction, by estimation, is 50 to 55%. The left ventricle has low normal function. The left ventricle demonstrates regional wall motion abnormalities, basal septal hypokinesis. There is moderate left ventricular hypertrophy of the septal segment. Left ventricular diastolic parameters are consistent with Grade I diastolic dysfunction (impaired relaxation). Elevated left ventricular end-diastolic pressure.  2. Right ventricular systolic function is normal. The right ventricular size is normal. Tricuspid regurgitation signal is inadequate for assessing PA pressure.  3. The mitral valve is abnormal. No evidence of mitral valve regurgitation. No evidence of mitral stenosis. Moderate mitral annular calcification.  4. The aortic valve has an indeterminant number of cusps. Aortic valve regurgitation is not visualized. No aortic stenosis is present. FINDINGS  Left Ventricle: Left ventricular ejection fraction, by estimation, is 50 to 55%. The left ventricle has low normal function. The left ventricle demonstrates regional wall motion abnormalities. Definity contrast agent was given IV to delineate the left ventricular endocardial borders. Strain was performed and the global longitudinal strain  is indeterminate. The left ventricular internal cavity size was normal in size. There is moderate left ventricular hypertrophy of the septal segment. Left  ventricular diastolic parameters are consistent with Grade I diastolic dysfunction (impaired relaxation). Elevated left ventricular end-diastolic pressure. Right Ventricle: The right ventricular size is normal. No increase in right ventricular wall thickness. Right ventricular systolic function is normal. Tricuspid regurgitation signal is inadequate for assessing PA pressure. Left Atrium: Left atrial size was normal in size. Right Atrium: Right atrial size was normal in size. Pericardium: There is no evidence of pericardial effusion. Mitral Valve: The mitral valve is abnormal. Moderate mitral annular calcification. No evidence of mitral valve regurgitation. No evidence of mitral valve stenosis. Tricuspid Valve: The tricuspid valve is normal in structure. Tricuspid valve regurgitation is trivial. No evidence of tricuspid stenosis. Aortic Valve: The aortic valve has an indeterminant number of cusps. Aortic valve regurgitation is not visualized. No aortic stenosis is present. Pulmonic Valve: The pulmonic valve was grossly normal. Pulmonic valve regurgitation is not visualized. No evidence of pulmonic stenosis. Aorta: The aortic root is normal in size and structure. Venous: The inferior vena cava was not well visualized. IAS/Shunts: The interatrial septum was not well visualized. Additional Comments: 3D was performed not requiring image post processing on an independent workstation and was indeterminate.  LEFT VENTRICLE PLAX 2D LVIDd:         5.00 cm   Diastology LVIDs:         3.80 cm   LV e' medial:    4.85 cm/s LV PW:         1.10 cm   LV E/e' medial:  21.2 LV IVS:        1.40 cm   LV e' lateral:   7.34 cm/s LVOT diam:     2.20 cm   LV E/e' lateral: 14.0 LV SV:         57 LV SV Index:   27 LVOT Area:     3.80 cm  RIGHT VENTRICLE RV S prime:     10.70 cm/s TAPSE (M-mode): 2.5 cm LEFT ATRIUM             Index LA diam:        3.40 cm 1.59 cm/m LA Vol (A2C):   73.4 ml 34.29 ml/m LA Vol (A4C):   55.3 ml 25.84 ml/m LA  Biplane Vol: 67.7 ml 31.63 ml/m  AORTIC VALVE LVOT Vmax:   85.70 cm/s LVOT Vmean:  60.000 cm/s LVOT VTI:    0.151 m  AORTA Ao Root diam: 4.00 cm MITRAL VALVE MV Area (PHT): 10.84 cm    SHUNTS MV Decel Time: 70 msec      Systemic VTI:  0.15 m MV E velocity: 103.00 cm/s  Systemic Diam: 2.20 cm Vishnu Priya Mallipeddi Electronically signed by Diannah Late Mallipeddi Signature Date/Time: 07/04/2024/3:33:16 PM    Final         Scheduled Meds:  carvedilol   6.25 mg Oral BID WC   cholecalciferol   2,000 Units Oral QPM   enoxaparin  (LOVENOX ) injection  40 mg Subcutaneous Q24H   folic acid   1 mg Oral Daily   gabapentin   200 mg Oral BID   lactulose   30 g Oral BID   multivitamin with minerals  1 tablet Oral Daily   nicotine   21 mg Transdermal Daily   pantoprazole   40 mg Oral BID   phosphorus  500 mg Oral BID   thiamine   100 mg Oral Daily   Or  thiamine   100 mg Intravenous Daily   traZODone   100 mg Oral QHS   Continuous Infusions:  0.9 % NaCl with KCl 20 mEq / L 75 mL/hr at 07/06/24 0413     LOS: 2 days    Time spent: 55 minutes    Lakeem Rozo D Maree, DO [facility]  If 7PM-7AM, please contact night-coverage [email or web address] 07/06/2024, 11:21 AM

## 2024-07-06 NOTE — Plan of Care (Signed)
  Problem: Acute Rehab OT Goals (only OT should resolve) Goal: Pt. Will Perform Grooming Flowsheets (Taken 07/06/2024 1317) Pt Will Perform Grooming:  with contact guard assist  with min assist  sitting Goal: Pt. Will Perform Lower Body Bathing Flowsheets (Taken 07/06/2024 1317) Pt Will Perform Lower Body Bathing:  with min assist  with adaptive equipment  sitting/lateral leans Goal: Pt. Will Perform Upper Body Dressing Flowsheets (Taken 07/06/2024 1317) Pt Will Perform Upper Body Dressing:  with set-up  with contact guard assist  sitting Goal: Pt. Will Perform Lower Body Dressing Flowsheets (Taken 07/06/2024 1317) Pt Will Perform Lower Body Dressing:  with min assist  sitting/lateral leans Goal: Pt. Will Transfer To Toilet Flowsheets (Taken 07/06/2024 1317) Pt Will Transfer to Toilet:  with modified independence  ambulating Goal: Pt. Will Perform Toileting-Clothing Manipulation Flowsheets (Taken 07/06/2024 1317) Pt Will Perform Toileting - Clothing Manipulation and hygiene:  with contact guard assist  with min assist  sitting/lateral leans  with adaptive equipment Goal: Pt/Caregiver Will Perform Home Exercise Program Flowsheets (Taken 07/06/2024 1317) Pt/caregiver will Perform Home Exercise Program:  Increased strength  Left upper extremity  Independently  Zaylon Bossier OT, MOT

## 2024-07-06 NOTE — TOC Initial Note (Signed)
 Transition of Care [facility]) - Initial/Assessment Note    Patient Details  Name: Timothy [name] MRN: [id number] Date of Birth: 1971-08-24  [facility]) CM/SW Contact:    Mcarthur Saddie Kim, LCSW Phone Number: 07/06/2024, 11:40 AM  Clinical Narrative: TOC received consult for substance abuse counseling. Pt reports he drinks 2 shots a day 3-4 days a week. He does not feel this is a problem for him. Pt undecided if he is interested in outpatient treatment resources, but agreed to LCSW adding [facility] to AVS for follow up after d/c if desired.   PT/OT evaluated pt and recommend home health. Pt lives with his wife and son who assist with ADLs as needed. He is open to home health, but would like to talk to his friend about what agency they recommend. TOC will follow up. Pt will transfer to [facility] for [facility].                   Expected Discharge Plan: Home w Home Health Services Barriers to Discharge: Continued Medical Work up   Patient Goals and CMS Choice Patient states their goals for this hospitalization and ongoing recovery are:: return home   Choice offered to / list presented to : Patient [place] ownership interest in [facility].provided to::  (n/a)    Expected Discharge Plan and Services In-house Referral: Clinical Social Work   Post Acute Care Choice: Home Health Living arrangements for the past 2 months: Single Family Home                                      Prior Living Arrangements/Services Living arrangements for the past 2 months: Single Family Home Lives with:: Spouse, Adult Children Patient language and need for interpreter reviewed:: Yes Do you feel safe going back to the place where you live?: Yes      Need for Family Participation in Patient Care: No (Comment) Care giver support system in place?: Yes (comment)   Criminal Activity/Legal Involvement Pertinent to Current Situation/Hospitalization: No -  Comment as needed  Activities of Daily Living      Permission Sought/Granted                  Emotional Assessment     Affect (typically observed): Accepting Orientation: : Oriented to Self, Oriented to Place, Oriented to  Time, Oriented to Situation Alcohol  / Substance Use: Alcohol  Use Psych Involvement: No (comment)  Admission diagnosis:  Closed comminuted right humeral fracture [S42.351A] Patient Active Problem List   Diagnosis Date Noted   Uncontrolled pain 07/05/2024   Closed comminuted right humeral fracture 07/04/2024   Alcohol  abuse 07/04/2024   Alcoholic hepatitis (HCC) 06/13/2023   Ileus (HCC) 05/08/2023   Abdominal pain 04/28/2023   Alcoholic cirrhosis (HCC) 04/28/2023   Hypomagnesemia 04/28/2023   Hematemesis with nausea 09/17/2022   Barrett's esophagus without dysplasia 02/09/2021   PUD (peptic ulcer disease)/Gastric Ulcers---etoh and Nsaid related 12/20/2020   Alcohol  withdrawal/DTs 12/19/2020   Nausea and vomiting    Dysphagia    Elevated LFTs    Hyperbilirubinemia    Chronic combined systolic (congestive) and diastolic (congestive) heart failure (HCC) 05/08/2016   Acute CHF (congestive heart failure) (HCC) 04/18/2016   ETOH abuse 04/18/2016   RBBB 04/18/2016   Cardiomyopathy (HCC) 04/18/2016   Family history of coronary artery disease in father 04/18/2016   Dyslipidemia 04/18/2016  Elevated brain natriuretic peptide (BNP) level    Acute on chronic diastolic heart failure (HCC)    Interstitial edema    Hemoptysis 04/11/2016   Orthopnea 04/11/2016   Elevated troponin I level    Stroke (HCC) 10/28/2014   Hypertensive emergency 10/28/2014   Left sided numbness 10/28/2014   Hepatomegaly 10/03/2014   GERD (gastroesophageal reflux disease) 07/13/2013   Hemorrhoids 07/13/2013   Abdominal pain, chronic, right upper quadrant 05/03/2013   Bowel habit changes 05/03/2013   FH: colon cancer 05/03/2013   Rectal bleeding 05/03/2013   Hypokalemia  03/08/2013   History of Wolff-Parkinson-White syndrome 03/08/2013   Malignant hypertension 03/08/2013   Polycythemia 03/08/2013   Tobacco abuse 03/08/2013   Chronic anxiety 03/08/2013   Leukocytosis 03/08/2013   Laceration of arm 03/08/2013   Chest tightness 03/08/2013   CAD (coronary artery disease) 08/11/2012   Smoking 08/11/2012   NIGHT SWEATS 10/23/2010   Anxiety state 12/23/2008   Essential hypertension 12/23/2008   SYNCOPE 12/23/2008   Palpitations 12/23/2008   TACHYCARDIA, HX OF 12/23/2008   PCP:  Bertell Satterfield, MD Pharmacy:   [facility] [place], [place] - [address] [place] [address] Phone: [phone] Fax: [phone]  [id number] [place], [place] - [address] AT [facility] OF [address] [place] [address] Phone: [phone] Fax: [phone]     Social Drivers of Health (SDOH) Social History: SDOH Screenings   Food Insecurity: No Food Insecurity (07/04/2024)  Housing: Low Risk  (07/04/2024)  Transportation Needs: No Transportation Needs (07/04/2024)  Utilities: Not At Risk (07/04/2024)  Tobacco Use: High Risk (07/03/2024)   SDOH Interventions: Food Insecurity Interventions: Intervention Not Indicated Housing Interventions: Intervention Not Indicated Transportation Interventions: Intervention Not Indicated Utilities Interventions: Intervention Not Indicated   Readmission Risk Interventions     No data to display

## 2024-07-06 NOTE — Progress Notes (Signed)
  Patient fell and sustained a right comminuted proximal humerus fracture, with dislocation of the humeral head posterior.  He was evaluated yesterday.  I discussed the potential treatment options with him.  During our discussion, he was interested in considering shoulder reconstruction.  His care was discussed with the hospitalist, who expressed some concerns about his ongoing health overall.  In addition, case was discussed with anesthesia who did not feel that the care that he required was available at [facility], and recommended transfer if surgery was a consideration.  I have discussed his care with Dr. Cristy, who recommended reverse shoulder arthroplasty for his fracture pattern.  He has requested time in the operating room at [facility] afternoon.  As such, I have discussed this with the hospitalist, and recommended transfer so that the patient can receive appropriate care.  If there are additional questions, I am available   Fynley Chrystal A. Onesimo, MD MS [facility] [address] [place],  [place]  [address] Phone: [phone] Fax: [phone]

## 2024-07-06 NOTE — Progress Notes (Signed)
 [facility] transporting patient to [facility]ogg RN

## 2024-07-06 NOTE — Plan of Care (Signed)
  Problem: Acute Rehab PT Goals(only PT should resolve) Goal: Pt Will Go Supine/Side To Sit Outcome: Progressing Flowsheets (Taken 07/06/2024 1216) Pt will go Supine/Side to Sit:  with contact guard assist  with supervision Goal: Pt Will Go Sit To Supine/Side Outcome: Progressing Flowsheets (Taken 07/06/2024 1216) Pt will go Sit to Supine/Side:  with supervision  with contact guard assist Goal: Patient Will Perform Sitting Balance Outcome: Progressing Flowsheets (Taken 07/06/2024 1216) Patient will perform sitting balance:  with supervision  with modified independence Goal: Patient Will Transfer Sit To/From Stand Outcome: Progressing Flowsheets (Taken 07/06/2024 1216) Patient will transfer sit to/from stand:  with supervision  with contact guard assist Goal: Pt Will Transfer Bed To Chair/Chair To Bed Outcome: Progressing Flowsheets (Taken 07/06/2024 1216) Pt will Transfer Bed to Chair/Chair to Bed: with contact guard assist Goal: Pt Will Perform Standing Balance Or Pre-Gait Outcome: Progressing Flowsheets (Taken 07/06/2024 1216) Pt will perform standing balance or pre-gait:  with contact guard assist  with Supervision Note: With AD Goal: Pt Will Ambulate Outcome: Progressing Flowsheets (Taken 07/06/2024 1216) Pt will Ambulate:  25 feet  with supervision  with contact guard assist Note: With AD  Laverda Stribling, SPT

## 2024-07-07 ENCOUNTER — Inpatient Hospital Stay (HOSPITAL_COMMUNITY): Admitting: Anesthesiology

## 2024-07-07 ENCOUNTER — Inpatient Hospital Stay (HOSPITAL_COMMUNITY)

## 2024-07-07 ENCOUNTER — Encounter (HOSPITAL_COMMUNITY)
Admission: EM | Disposition: A | Discharge status: Home Health | Payer: Self-pay | Source: Home / Self Care | Attending: Internal Medicine

## 2024-07-07 ENCOUNTER — Encounter (HOSPITAL_COMMUNITY): Payer: Self-pay | Admitting: Internal Medicine

## 2024-07-07 DIAGNOSIS — F17210 Nicotine dependence, cigarettes, uncomplicated: Secondary | ICD-10-CM | POA: Diagnosis not present

## 2024-07-07 DIAGNOSIS — S42351A Displaced comminuted fracture of shaft of humerus, right arm, initial encounter for closed fracture: Secondary | ICD-10-CM

## 2024-07-07 DIAGNOSIS — I2510 Atherosclerotic heart disease of native coronary artery without angina pectoris: Secondary | ICD-10-CM | POA: Diagnosis not present

## 2024-07-07 DIAGNOSIS — I10 Essential (primary) hypertension: Secondary | ICD-10-CM

## 2024-07-07 DIAGNOSIS — S42201A Unspecified fracture of upper end of right humerus, initial encounter for closed fracture: Secondary | ICD-10-CM | POA: Diagnosis not present

## 2024-07-07 HISTORY — PX: TOTAL SHOULDER ARTHROPLASTY: SHX126

## 2024-07-07 LAB — SURGICAL PCR SCREEN
MRSA, PCR: NEGATIVE
Staphylococcus aureus: NEGATIVE

## 2024-07-07 SURGERY — ARTHROPLASTY, SHOULDER, TOTAL
Anesthesia: Regional | Site: Shoulder | Laterality: Right

## 2024-07-07 MED ORDER — FENTANYL CITRATE (PF) 50 MCG/ML IJ SOSY
25.0000 ug | PREFILLED_SYRINGE | Freq: Once | INTRAMUSCULAR | Status: AC
Start: 2024-07-07 — End: 2024-07-07
  Administered 2024-07-07: 100 ug via INTRAVENOUS
  Filled 2024-07-07: qty 2

## 2024-07-07 MED ORDER — PHENYLEPHRINE 80 MCG/ML (10ML) SYRINGE FOR IV PUSH (FOR BLOOD PRESSURE SUPPORT)
PREFILLED_SYRINGE | INTRAVENOUS | Status: AC
  Filled 2024-07-07: qty 10

## 2024-07-07 MED ORDER — LACTATED RINGERS IV SOLN: INTRAVENOUS | Status: DC | PRN

## 2024-07-07 MED ORDER — MIDAZOLAM HCL (PF) 2 MG/2ML IJ SOLN
0.5000 mg | Freq: Once | INTRAMUSCULAR | Status: AC
Start: 2024-07-07 — End: 2024-07-07
  Administered 2024-07-07: 2 mg via INTRAVENOUS
  Filled 2024-07-07: qty 2

## 2024-07-07 MED ORDER — FENTANYL CITRATE (PF) 100 MCG/2ML IJ SOLN
INTRAMUSCULAR | Status: AC
  Filled 2024-07-07: qty 2

## 2024-07-07 MED ORDER — PHENYLEPHRINE HCL-NACL 20-0.9 MG/250ML-% IV SOLN
INTRAVENOUS | Status: DC | PRN
  Administered 2024-07-07: 40 ug/min via INTRAVENOUS

## 2024-07-07 MED ORDER — CELECOXIB 200 MG PO CAPS
200.0000 mg | ORAL_CAPSULE | Freq: Every day | ORAL | Status: DC
  Administered 2024-07-07 – 2024-07-08 (×2): 200 mg via ORAL
  Filled 2024-07-07 (×2): qty 1

## 2024-07-07 MED ORDER — ORAL CARE MOUTH RINSE: 15.0000 mL | Freq: Once | OROMUCOSAL | Status: AC

## 2024-07-07 MED ORDER — CEFAZOLIN SODIUM-DEXTROSE 2-4 GM/100ML-% IV SOLN: 2.0000 g | INTRAVENOUS | Status: DC

## 2024-07-07 MED ORDER — ONDANSETRON HCL 4 MG/2ML IJ SOLN
INTRAMUSCULAR | Status: AC
  Filled 2024-07-07: qty 2

## 2024-07-07 MED ORDER — VANCOMYCIN HCL 1000 MG IV SOLR
INTRAVENOUS | Status: AC
  Filled 2024-07-07: qty 20

## 2024-07-07 MED ORDER — VANCOMYCIN HCL 1 G IV SOLR
INTRAVENOUS | Status: DC | PRN
  Administered 2024-07-07: 1000 mg via TOPICAL

## 2024-07-07 MED ORDER — ACETAMINOPHEN 500 MG PO TABS
1000.0000 mg | ORAL_TABLET | Freq: Once | ORAL | Status: AC
  Administered 2024-07-07: 1000 mg via ORAL
  Filled 2024-07-07: qty 2

## 2024-07-07 MED ORDER — POTASSIUM CHLORIDE 10 MEQ/100ML IV SOLN
10.0000 meq | INTRAVENOUS | Status: AC
  Administered 2024-07-07 (×2): 10 meq via INTRAVENOUS
  Filled 2024-07-07 (×2): qty 100

## 2024-07-07 MED ORDER — GABAPENTIN 300 MG PO CAPS
300.0000 mg | ORAL_CAPSULE | Freq: Once | ORAL | Status: AC
Start: 2024-07-07 — End: 2024-07-07
  Administered 2024-07-07: 300 mg via ORAL
  Filled 2024-07-07: qty 1

## 2024-07-07 MED ORDER — FENTANYL CITRATE (PF) 50 MCG/ML IJ SOSY
25.0000 ug | PREFILLED_SYRINGE | INTRAMUSCULAR | Status: DC | PRN
  Administered 2024-07-07: 50 ug via INTRAVENOUS

## 2024-07-07 MED ORDER — SUGAMMADEX SODIUM 200 MG/2ML IV SOLN
INTRAVENOUS | Status: DC | PRN
  Administered 2024-07-07: 200 mg via INTRAVENOUS

## 2024-07-07 MED ORDER — LACTATED RINGERS IV SOLN: INTRAVENOUS | Status: DC

## 2024-07-07 MED ORDER — AMISULPRIDE (ANTIEMETIC) 5 MG/2ML IV SOLN: 10.0000 mg | Freq: Once | INTRAVENOUS | Status: DC | PRN

## 2024-07-07 MED ORDER — POTASSIUM CHLORIDE 10 MEQ/100ML IV SOLN
10.0000 meq | INTRAVENOUS | Status: AC
  Administered 2024-07-07 (×3): 10 meq via INTRAVENOUS
  Filled 2024-07-07 (×3): qty 100

## 2024-07-07 MED ORDER — ACETAMINOPHEN 500 MG PO TABS: 1000.0000 mg | ORAL_TABLET | Freq: Once | ORAL | Status: DC

## 2024-07-07 MED ORDER — ROCURONIUM BROMIDE 10 MG/ML (PF) SYRINGE
PREFILLED_SYRINGE | INTRAVENOUS | Status: DC | PRN
  Administered 2024-07-07: 60 mg via INTRAVENOUS

## 2024-07-07 MED ORDER — ALBUMIN HUMAN 5 % IV SOLN: INTRAVENOUS | Status: DC | PRN

## 2024-07-07 MED ORDER — DEXAMETHASONE SOD PHOSPHATE PF 10 MG/ML IJ SOLN
INTRAMUSCULAR | Status: DC | PRN
  Administered 2024-07-07: 10 mg via INTRAVENOUS

## 2024-07-07 MED ORDER — SODIUM CHLORIDE 0.9 % IR SOLN
Status: DC | PRN
  Administered 2024-07-07: 1000 mL

## 2024-07-07 MED ORDER — OXYCODONE HCL 5 MG PO TABS
ORAL_TABLET | ORAL | Status: AC
  Filled 2024-07-07: qty 1

## 2024-07-07 MED ORDER — SUGAMMADEX SODIUM 200 MG/2ML IV SOLN
INTRAVENOUS | Status: AC
  Filled 2024-07-07: qty 2

## 2024-07-07 MED ORDER — CEFAZOLIN SODIUM-DEXTROSE 2-4 GM/100ML-% IV SOLN
2.0000 g | INTRAVENOUS | Status: AC
  Administered 2024-07-07: 2 g via INTRAVENOUS
  Filled 2024-07-07: qty 100

## 2024-07-07 MED ORDER — KETAMINE HCL 50 MG/5ML IJ SOSY
PREFILLED_SYRINGE | INTRAMUSCULAR | Status: AC
  Filled 2024-07-07: qty 5

## 2024-07-07 MED ORDER — PROPOFOL 10 MG/ML IV BOLUS
INTRAVENOUS | Status: DC | PRN
  Administered 2024-07-07: 150 mg via INTRAVENOUS

## 2024-07-07 MED ORDER — OXYCODONE HCL 5 MG/5ML PO SOLN: 5.0000 mg | Freq: Once | ORAL | Status: AC | PRN

## 2024-07-07 MED ORDER — DEXMEDETOMIDINE HCL IN NACL 80 MCG/20ML IV SOLN
INTRAVENOUS | Status: DC | PRN
  Administered 2024-07-07: 12 ug via INTRAVENOUS

## 2024-07-07 MED ORDER — EPHEDRINE SULFATE-NACL 50-0.9 MG/10ML-% IV SOSY
PREFILLED_SYRINGE | INTRAVENOUS | Status: DC | PRN
  Administered 2024-07-07 (×2): 10 mg via INTRAVENOUS

## 2024-07-07 MED ORDER — TRANEXAMIC ACID-NACL 1000-0.7 MG/100ML-% IV SOLN
1000.0000 mg | INTRAVENOUS | Status: AC
  Administered 2024-07-07: 1000 mg via INTRAVENOUS
  Filled 2024-07-07: qty 100

## 2024-07-07 MED ORDER — OXYCODONE HCL 5 MG PO TABS
5.0000 mg | ORAL_TABLET | Freq: Once | ORAL | Status: AC | PRN
  Administered 2024-07-07: 5 mg via ORAL

## 2024-07-07 MED ORDER — FENTANYL CITRATE (PF) 250 MCG/5ML IJ SOLN
INTRAMUSCULAR | Status: DC | PRN
  Administered 2024-07-07: 100 ug via INTRAVENOUS

## 2024-07-07 MED ORDER — PROPOFOL 10 MG/ML IV BOLUS
INTRAVENOUS | Status: AC
  Filled 2024-07-07: qty 20

## 2024-07-07 MED ORDER — ORAL CARE MOUTH RINSE: 15.0000 mL | OROMUCOSAL | Status: DC | PRN

## 2024-07-07 MED ORDER — NAPHAZOLINE-GLYCERIN 0.012-0.25 % OP SOLN: 1.0000 [drp] | Freq: Four times a day (QID) | OPHTHALMIC | Status: DC | PRN

## 2024-07-07 MED ORDER — SPIRONOLACTONE 25 MG PO TABS
50.0000 mg | ORAL_TABLET | Freq: Every day | ORAL | Status: DC
  Administered 2024-07-08: 50 mg via ORAL
  Filled 2024-07-07: qty 2

## 2024-07-07 MED ORDER — KETAMINE HCL 10 MG/ML IJ SOLN
INTRAMUSCULAR | Status: DC | PRN
  Administered 2024-07-07: 30 mg via INTRAVENOUS

## 2024-07-07 MED ORDER — CEFAZOLIN SODIUM-DEXTROSE 1-4 GM/50ML-% IV SOLN
1.0000 g | Freq: Three times a day (TID) | INTRAVENOUS | Status: AC
  Administered 2024-07-07 – 2024-07-08 (×2): 1 g via INTRAVENOUS
  Filled 2024-07-07 (×2): qty 50

## 2024-07-07 MED ORDER — FUROSEMIDE 20 MG PO TABS
20.0000 mg | ORAL_TABLET | Freq: Every day | ORAL | Status: DC
  Administered 2024-07-07 – 2024-07-08 (×2): 20 mg via ORAL
  Filled 2024-07-07 (×2): qty 1

## 2024-07-07 MED ORDER — CHLORHEXIDINE GLUCONATE 0.12 % MT SOLN
15.0000 mL | Freq: Once | OROMUCOSAL | Status: AC
Start: 2024-07-07 — End: 2024-07-07
  Administered 2024-07-07: 15 mL via OROMUCOSAL

## 2024-07-07 MED ORDER — FENTANYL CITRATE (PF) 50 MCG/ML IJ SOSY
PREFILLED_SYRINGE | INTRAMUSCULAR | Status: AC
  Filled 2024-07-07: qty 1

## 2024-07-07 MED ORDER — ALBUMIN HUMAN 5 % IV SOLN
INTRAVENOUS | Status: AC
  Filled 2024-07-07: qty 250

## 2024-07-07 SURGICAL SUPPLY — 64 items
BAG COUNTER SPONGE SURGICOUNT (BAG) ×2 IMPLANT
BASEPLATE GLENOID RSA 3X25 0D (Shoulder) IMPLANT
BIT DRILL 3.2 PERIPHERAL SCREW (BIT) IMPLANT
BLADE SAW SGTL 73X25 THK (BLADE) ×2 IMPLANT
CHLORAPREP W/TINT 26 (MISCELLANEOUS) ×4 IMPLANT
CLSR STERI-STRIP ANTIMIC 1/2X4 (GAUZE/BANDAGES/DRESSINGS) ×2 IMPLANT
COOLER ICEMAN CLASSIC (MISCELLANEOUS) IMPLANT
COVER BACK TABLE 60X90IN (DRAPES) ×2 IMPLANT
COVER SURGICAL LIGHT HANDLE (MISCELLANEOUS) ×2 IMPLANT
CUP HUM INSERT SHLD 3/4 39 +0 (Cup) IMPLANT
DRAPE INCISE IOBAN 66X45 STRL (DRAPES) ×2 IMPLANT
DRAPE POUCH INSTRU U-SHP 10X18 (DRAPES) ×2 IMPLANT
DRAPE SHEET LG 3/4 BI-LAMINATE (DRAPES) ×2 IMPLANT
DRAPE SURG ORHT 6 SPLT 77X108 (DRAPES) ×4 IMPLANT
DRAPE TOP 10253 STERILE (DRAPES) ×2 IMPLANT
DRSG AQUACEL AG ADV 3.5X 6 (GAUZE/BANDAGES/DRESSINGS) ×2 IMPLANT
DRSG AQUACEL AG ADV 3.5X10 (GAUZE/BANDAGES/DRESSINGS) IMPLANT
DRSG XEROFORM 1X8 (GAUZE/BANDAGES/DRESSINGS) IMPLANT
ELECT BLADE TIP CTD 4 INCH (ELECTRODE) ×2 IMPLANT
ELECT PENCIL ROCKER SW 15FT (MISCELLANEOUS) ×2 IMPLANT
ELECT REM PT RETURN 15FT ADLT (MISCELLANEOUS) ×2 IMPLANT
FACESHIELD WRAPAROUND OR TEAM (MASK) ×2 IMPLANT
GLENOSPHERE STD 39 (Joint) IMPLANT
GLOVE BIO SURGEON STRL SZ 6.5 (GLOVE) ×4 IMPLANT
GLOVE BIOGEL PI IND STRL 8 (GLOVE) ×2 IMPLANT
GLOVE ECLIPSE 8.0 STRL XLNG CF (GLOVE) ×4 IMPLANT
GLOVE INDICATOR 6.5 STRL GRN (GLOVE) ×2 IMPLANT
GOWN STRL REUS W/ TWL LRG LVL3 (GOWN DISPOSABLE) ×4 IMPLANT
GUIDEWIRE GLENOID 2.5X220 (WIRE) IMPLANT
KIT BASIN OR (CUSTOM PROCEDURE TRAY) ×2 IMPLANT
KIT STABILIZATION SHOULDER (MISCELLANEOUS) ×2 IMPLANT
KIT TURNOVER KIT A (KITS) ×2 IMPLANT
MANIFOLD NEPTUNE II (INSTRUMENTS) ×2 IMPLANT
NDL HYPO 25X1 1.5 SAFETY (NEEDLE) IMPLANT
NDL MAYO CATGUT SZ4 TPR NDL (NEEDLE) IMPLANT
NDL TROCAR POINT SZ 2 1/2 (NEEDLE) IMPLANT
NEEDLE HYPO 25X1 1.5 SAFETY (NEEDLE) IMPLANT
NEEDLE MAYO CATGUT SZ4 (NEEDLE) IMPLANT
NEEDLE TROCAR POINT SZ 2 1/2 (NEEDLE) ×1 IMPLANT
NS IRRIG 1000ML POUR BTL (IV SOLUTION) ×2 IMPLANT
PACK SHOULDER (CUSTOM PROCEDURE TRAY) ×2 IMPLANT
PAD COLD SHLDR WRAP-ON (PAD) IMPLANT
PENCIL SMOKE EVACUATOR (MISCELLANEOUS) IMPLANT
RESTRAINT HEAD UNIVERSAL NS (MISCELLANEOUS) ×2 IMPLANT
SCREW 5.5X22 (Screw) IMPLANT
SCREW CENTRAL THREAD 6.5X45 (Screw) IMPLANT
SCREW PERIPHERAL 30 (Screw) IMPLANT
SET HNDPC FAN SPRY TIP SCT (DISPOSABLE) ×2 IMPLANT
SLING ULTRA II L (ORTHOPEDIC SUPPLIES) IMPLANT
SLING ULTRA III MED (ORTHOPEDIC SUPPLIES) IMPLANT
SPONGE T-LAP 4X18 ~~LOC~~+RFID (SPONGE) ×4 IMPLANT
STEM PERFORM FX 130 12L (Stem) IMPLANT
STRIP CLOSURE SKIN 1/2X4 (GAUZE/BANDAGES/DRESSINGS) IMPLANT
SUT ETHIBOND 2 V 37 (SUTURE) ×2 IMPLANT
SUT ETHIBOND NAB CT1 #1 30IN (SUTURE) ×2 IMPLANT
SUT ETHILON 3 0 PS 1 (SUTURE) IMPLANT
SUT MNCRL AB 4-0 PS2 18 (SUTURE) ×2 IMPLANT
SUT VIC AB 0 CT1 36 (SUTURE) IMPLANT
SUT VIC AB 3-0 SH 27X BRD (SUTURE) ×2 IMPLANT
SUTURE FIBERWR #5 38 CONV NDL (SUTURE) IMPLANT
TOWEL OR 17X26 10 PK STRL BLUE (TOWEL DISPOSABLE) ×2 IMPLANT
TOWER SMARTMIX MINI (MISCELLANEOUS) IMPLANT
TUBE SUCTION HIGH CAP CLEAR NV (SUCTIONS) ×2 IMPLANT
WATER STERILE IRR 1000ML POUR (IV SOLUTION) ×2 IMPLANT

## 2024-07-07 NOTE — Transfer of Care (Signed)
 Immediate Anesthesia Transfer of Care Note  Patient: Timothy [name]  Procedure(s) Performed: RIGHT REVERSE TOTAL SHOULDER ARTHROPLASTY FOR FRACTURE (Right: Shoulder)  Patient Location: PACU  Anesthesia Type:GA combined with regional for post-op pain  Level of Consciousness: awake, alert , and oriented  Airway & Oxygen Therapy: Patient Spontanous Breathing and Patient connected to face mask oxygen  Post-op Assessment: Report given to RN and Post -op Vital signs reviewed and stable  Post vital signs: Reviewed and stable  Last Vitals:  Vitals Value Taken Time  BP 132/78 07/07/24 14:09  Temp    Pulse    Resp 15 07/07/24 14:11  SpO2    Vitals shown include unfiled device data.  Last Pain:  Vitals:   07/07/24 1204  TempSrc:   PainSc: 0-No pain      Patients Stated Pain Goal: 3 (07/07/24 0900)  Complications: No notable events documented.

## 2024-07-07 NOTE — Progress Notes (Signed)
 OT Cancellation Note  Patient Details Name: Timothy [name] MRN: [id number] DOB: 1971-06-12   Cancelled Treatment:    Reason Eval/Treat Not Completed: Patient at procedure or test/ unavailable.  Patient in OR at time of attempt.  Belvie FURY Garlin Batdorf, MS, OTR/L  07/07/2024, 10:58 AM

## 2024-07-07 NOTE — Progress Notes (Signed)
 [facility] PROGRESS NOTE    Progress Note  Timothy [name]  FMW:[id number] DOB: 1971/08/28 DOA: 07/03/2024 PCP: Bertell Satterfield, MD     Brief Narrative:   Timothy [name] is an 53 y.o. male past medical history of opioid dependent, HFpEF, Barrett esophagus Wolff-Parkinson-White status post ablation, history of nonischemic cardiomyopathy, alcohol  abuse liver cirrhosis comes in with right arm pain after mechanical fall about 10 days prior to admission while playing with his son   Assessment/Plan:   Closed comminuted right humeral fracture The case was discussed with orthopedic surgery Dr. Cristy who recommended to transfer from [facility] management on 07/07/2024. Narcotics per orthopedic surgery  Acute metabolic encephalopathy: Of unclear etiology, EEG unremarkable ammonia level within normal limits, B12 TSH CT of the brain folic acid  UA were negative. UDS was positive for cannabis.  Intractable vomiting: Question due to narcotics and cannabis now resolved. GI was consulted recommended no further evaluation. May consider referral as an outpatient. Continue Protonix  twice a day.  Acute kidney injury: With a baseline creatinine around 1, on admission 1.5. Likely prerenal creatinine returned to baseline after IV fluids.  Alcoholic liver cirrhosis: Patient continues to drink. He was restarted on his Coreg . Holding furosemide  and Aldactone  temporarily for surgery.  Alcohol  abuse: Last drink was 07/03/2024 no signs of withdrawal continue to monitor with CIWA protocol. He was given thiamine  and folate.  Diarrhea: Now resolved.  Central hypertension: Continue Coreg .  Chronic pain syndrome/opiate dependence: Continue oxycodone  and Ambien . PDMP reviewed Oxycodone  15 mg, #125, last refill 06/03/2024 Ambien  10 mg, #30, last refill 04/20/2024  HFpEF: Appears to be compensated continue beta-blockers.  Hyponatremia: Appears to be hypovolemic  and alcoholic liver cirrhosis improving with holding Lasix  and Aldactone .  Hypokalemia: Try to keep potassium greater than 4 replete IV recheck in the morning. Try to keep magnesium  greater than 2 phosphorus greater than 3.   DVT prophylaxis: lovenox  Family Communication:none Status is: Inpatient Remains inpatient appropriate because: Humeral fracture    Code Status:     Code Status Orders  (From admission, onward)           Start     Ordered   07/04/24 0837  Full code  Continuous       Question:  By:  Answer:  Consent: discussion documented in EHR   07/04/24 0842           Code Status History     Date Active Date Inactive Code Status Order ID Comments User Context   04/28/2023 0741 05/12/2023 2148 Full Code [id number]  Willette Adriana LABOR, MD ED   09/18/2022 0114 09/21/2022 1534 Full Code [id number]  Debby Camila LABOR, MD ED   02/09/2021 2031 02/10/2021 2022 Full Code [id number]  Pearlean Manus, MD ED   12/18/2020 1515 12/20/2020 2113 Full Code [id number]  Ricky Fines, MD ED   05/08/2016 0957 05/08/2016 1703 Full Code [id number]  [place], Peter M, MD Inpatient   04/11/2016 0840 04/12/2016 1638 Full Code [id number]  Kerman Vina HERO, NP Inpatient   04/11/2016 0840 04/11/2016 0840 Full Code [id number]  Kerman Vina HERO, NP Inpatient   04/11/2016 0840 04/11/2016 0840 Full Code [id number]  Kerman Vina HERO, NP Inpatient   10/28/2014 2127 10/30/2014 2016 Full Code [id number]  Remonia Alm PARAS, MD ED   10/28/2014 2127 10/28/2014 2127 Full Code [id number]  Remonia Alm PARAS, MD ED   03/08/2013 1737 03/09/2013 1823 Full Code [id number]  Gasper Aquas, MD ED  IV Access:   Peripheral IV   Procedures and diagnostic studies:   EEG adult Result Date: 07/05/2024 Shelton Arlin KIDD, MD     07/05/2024  8:26 AM Patient Name: Timothy [name] MRN: [id number] Epilepsy Attending: Arlin KIDD Shelton Referring Physician/Provider: Evonnie Lenis, MD Date: 07/04/2024 Duration: 23.44 mins Patient history:  53yo M with ams. EEG to evaluate for seizure Level of alertness: Awake, asleep AEDs during EEG study: GBP, Ativan  Technical aspects: This EEG study was done with scalp electrodes positioned according to the 10-20 International system of electrode placement. Electrical activity was reviewed with band pass filter of 1-70Hz , sensitivity of 7 uV/mm, display speed of 50mm/sec with a 60Hz  notched filter applied as appropriate. EEG data were recorded continuously and digitally stored.  Video monitoring was available and reviewed as appropriate. Description: The posterior dominant rhythm consists of 8Hz  activity of moderate voltage (25-35 uV) seen predominantly in posterior head regions, symmetric and reactive to eye opening and eye closing. Sleep was characterized by vertex waves, sleep spindles (12 to 14 Hz), maximal frontocentral region. Hyperventilation and photic stimulation were not performed.   IMPRESSION: This study is within normal limits. No seizures or epileptiform discharges were seen throughout the recording. A normal interictal EEG does not exclude the diagnosis of epilepsy. Arlin KIDD Shelton     Medical Consultants:   None.   Subjective:    Timothy [name] pain is controlled worry about his home regimen  Objective:    Vitals:   07/06/24 1511 07/06/24 2036 07/07/24 0002 07/07/24 0521  BP: 122/69 120/76 133/79 117/67  Pulse: 87 81 87 89  Resp: 20 17 17 16   Temp: 97.9 F (36.6 C) 97.6 F (36.4 C) 97.6 F (36.4 C) 98.4 F (36.9 C)  TempSrc: Oral Axillary Axillary Oral  SpO2: 96% 96% 96% 96%  Weight:      Height:       SpO2: 96 %   Intake/Output Summary (Last 24 hours) at 07/07/2024 0752 Last data filed at 07/06/2024 2200 Gross per 24 hour  Intake 360 ml  Output --  Net 360 ml   Filed Weights   07/03/24 2327  Weight: 91.7 kg    Exam: General exam: In no acute distress. Respiratory system: Good air movement and clear to auscultation. Cardiovascular system: S1 & S2  heard, RRR. No JVD.  Gastrointestinal system: Abdomen is nondistended, soft and nontender.  Extremities: No pedal edema. Skin: Arm in sling Psychiatry: Judgement and insight appear normal. Mood & affect appropriate.    Data Reviewed:    Labs: Basic Metabolic Panel: Recent Labs  Lab 07/04/24 0016 07/04/24 1130 07/05/24 0335 07/06/24 0409  NA 133*  --  127* 132*  K 3.6  --  3.0* 3.3*  CL 93*  --  85* 92*  CO2 22  --  27 28  GLUCOSE 128*  --  84 82  BUN 16  --  26* 25*  CREATININE 1.16  --  1.50* 1.28*  CALCIUM  8.5*  --  8.9 9.1  MG  --  1.6* 1.9 2.1  PHOS  --  2.1* 2.2* 4.1   GFR Estimated Creatinine Clearance: 71.6 mL/min (A) (by C-G formula based on SCr of 1.28 mg/dL (H)). Liver Function Tests: Recent Labs  Lab 07/04/24 0016 07/05/24 0335 07/06/24 0409  AST 82* 65* 63*  ALT 33 30 27  ALKPHOS 166* 148* 151*  BILITOT 1.9* 2.6* 2.4*  PROT 7.7 7.1 6.9  ALBUMIN  4.0 3.8 3.6   No results  for input(s): LIPASE, AMYLASE in the last 168 hours. Recent Labs  Lab 07/04/24 1130 07/05/24 0948 07/06/24 0409  AMMONIA 72* 51* 28   Coagulation profile Recent Labs  Lab 07/05/24 1550  INR 1.1   COVID-19 Labs  No results for input(s): DDIMER, FERRITIN, LDH, CRP in the last 72 hours.  Lab Results  Component Value Date   SARSCOV2NAA NEGATIVE 09/24/2021   SARSCOV2NAA NEGATIVE 02/09/2021   SARSCOV2NAA NEGATIVE 12/18/2020    CBC: Recent Labs  Lab 07/04/24 0016 07/05/24 0335  WBC 10.3 7.0  NEUTROABS 7.1  --   HGB 12.0* 11.5*  HCT 35.8* 35.4*  MCV 83.4 86.1  PLT 274 209   Cardiac Enzymes: Recent Labs  Lab 07/04/24 0016  CKTOTAL 255   BNP (last 3 results) Recent Labs    07/04/24 0016  PROBNP 1,272.0*   CBG: No results for input(s): GLUCAP in the last 168 hours. D-Dimer: No results for input(s): DDIMER in the last 72 hours. Hgb A1c: No results for input(s): HGBA1C in the last 72 hours. Lipid Profile: No results for input(s):  CHOL, HDL, LDLCALC, TRIG, CHOLHDL, LDLDIRECT in the last 72 hours. Thyroid  function studies: Recent Labs    07/04/24 1130  TSH 5.290*   Anemia work up: Recent Labs    07/04/24 1130  VITAMINB12 631  FOLATE 4.9*   Sepsis Labs: Recent Labs  Lab 07/04/24 0016 07/05/24 0335  WBC 10.3 7.0   Microbiology No results found for this or any previous visit (from the past 240 hours).   Medications:    carvedilol   6.25 mg Oral BID WC   cholecalciferol   2,000 Units Oral QPM   enoxaparin  (LOVENOX ) injection  40 mg Subcutaneous Q24H   folic acid   1 mg Oral Daily   gabapentin   200 mg Oral BID   lactulose   30 g Oral BID   multivitamin with minerals  1 tablet Oral Daily   nicotine   21 mg Transdermal Daily   pantoprazole   40 mg Oral BID   phosphorus  500 mg Oral BID   thiamine   100 mg Oral Daily   Or   thiamine   100 mg Intravenous Daily   traZODone   100 mg Oral QHS   Continuous Infusions:    LOS: 3 days   Erle Odell Castor  [facility]  07/07/2024, 7:52 AM

## 2024-07-07 NOTE — Op Note (Signed)
 Orthopaedic Surgery Operative Note (CSN: [id number])  Timothy [name]  1970/10/16 Date of Surgery: 07/07/2024   Diagnoses:  Right proximal humerus fracture, right humeral head dislocation   Procedure: Right reverse total Shoulder Arthroplasty 23472 Right open treatment acute shoulder dislocation 23660 Right open reduction internal fixation tuberosities 23630   Operative Finding Successful completion of planned procedure.  Patient was sent due to the complexity of his injury.  He had a subacute shoulder dislocation with fracture.  The humeral head was completely dislocated posteriorly and locked.  It was took an extensive amount of time to extricate it.  At that point the fracture case was relatively routine.  Axillary nerve sensation before the case was somewhat altered.  Patient is at extremely high risk of periprosthetic loosening fracture and dislocation.  That said the implants were quite stable.  Post-operative plan: The patient will be NWB in sling.  The patient will be will be admitted to observation due to medical complexity, monitoring and pain management.  DVT prophylaxis Aspirin  81 mg twice daily for 6 weeks.  Pain control with PRN pain medication preferring oral medicines.  Follow up plan will be scheduled in approximately 7 days for incision check and XR.  Physical therapy to start after 4 weeks.  Implants: Tornier perform humeral size 12 stem, 0 retentive polyethylene, 39 standard glenosphere with a 25+3 baseplate, 45 center screw and 2 peripheral locking screws  Post-Op Diagnosis: Same Surgeons:Primary: Cristy Bonner DASEN, MD Assistants:Caroline McBane, PA-C Location: [facility] ROOM 07 Anesthesia: General with Exparel Interscalene Antibiotics: Ancef 2g preop, Vancomycin 1000mg  locally Tourniquet time: None Estimated Blood Loss: 100 Complications: None Specimens: None Implants: Implant Name Type Inv. Item Serial No. Manufacturer Lot No. LRB No. Used Action  BASEPLATE GLENOID RSA  3X25 0D - [id number] Shoulder BASEPLATE GLENOID RSA 3X25 0D [id number] [employer]  Right 1 Implanted  GLENOSPHERE STD 39 - [id number] Joint GLENOSPHERE STD 39 [id number] [employer]  Right 1 Implanted  SCREW CENTRAL THREAD 6.5X45 - [id number] Screw SCREW CENTRAL THREAD 6.5X45  [employer]  Right 1 Implanted  SCREW PERIPHERAL 30 - [id number] Screw SCREW PERIPHERAL 30  [employer]  Right 1 Implanted  SCREW 5.5X22 - [id number] Screw SCREW 5.5X22  [employer]  Right 1 Implanted  STEM PERFORM FX 130 12L - [id number] Stem STEM PERFORM FX 130 12L [id number] [employer]  Right 1 Implanted  CUP HUM INSERT SHLD 3/4 39 +0 - [id number] Cup CUP HUM INSERT SHLD 3/4 39 +0 [id number] [employer]  Right 1 Implanted    Indications for Surgery:   Timothy [name] is a 53 y.o. male with fall resulting in a fracture dislocation of the humerus.  Benefits and risks of operative and nonoperative management were discussed prior to surgery with patient/guardian(s) and informed consent form was completed.  Infection and need for further surgery were discussed as was prosthetic stability and cuff issues.  We additionally specifically discussed risks of axillary nerve injury, infection, periprosthetic fracture, continued pain and longevity of implants prior to beginning procedure.      Procedure:   The patient was identified in the preoperative holding area where the surgical site was marked. Block placed by anesthesia with exparel.  The patient was taken to the OR where a procedural timeout was called and the above noted anesthesia was induced.  The patient was positioned beachchair on allen table with spider arm positioner.  Preoperative antibiotics were dosed.  The patient's right shoulder was prepped and draped in the usual sterile  fashion.  A second preoperative timeout was called.       Standard deltopectoral approach was performed with a #10 blade. We dissected down to the subcutaneous tissues and the cephalic vein was  taken laterally with the deltoid. Clavipectoral fascia was incised in line with the incision. Deep retractors were placed. The long of the biceps tendon was identified and there was significant tenosynovitis present.  Tenodesis was performed to the pectoralis tendon with #2 Ethibond. The remaining biceps was followed up into the rotator interval where it was released.    We used the bicipital groove as a landmark for the lesser and greater tuberosity fragments.  We were able to mobilize the lesser tuberosity fragment however it was clear the subscapularis had completely torn away.  I was able to place stay stitches in that.  We then were able to identify the posterior superior cuff fragments which were separate and torn away from the humeral head.  The humeral head had dislocated and was locked posteriorly.  There was significant glenoid bone loss and notching of the posterior aspect of the glenoid into the humeral head.  It took an extended amount of time to use multiple clamps and a Cobb elevator to free the head from its impacted location and eventually remove it.  We then mobilized the greater tuberosity fragment and placed five #5 FiberWire's in it for eventual repair.   At this point the axillary nerve was found and palpated and with a tug test noted to be intact.  Protected throughout the remainder of the case with blunt retractors.    We then released the SGHL with bovie cautery prior to placing a curved mayo at the junction of the anterior glenoid well above the axillary nerve and bluntly dissecting the subscapularis from the capsule.  We then carefully protected the axillary nerve as we gently released the inferior capsule to fully mobilize the subscapularis.  An anterior deltoid retractor was then placed as well as a small Hohmann retractor superiorly.   The glenoid was relatively preserved as we would expect in this fracture patient.  The remaining labrum was removed circumferentially taking  great care not to disrupt the posterior capsule.    The glenoid drill guide was placed and used to drill a guide pin in the center, inferior position. The glenoid face was then reamed concentrically over the guide wire. The center hole was drilled over the guidepin in a near anatomic angle of version. Next the glenoid vault was drilled back and we placed 2 locking screws.  The base plate was screwed into the glenoid vault obtaining secure fixation. We next placed superior and inferior locking screws for additional fixation.  Next the above size glenosphere was selected and impacted onto the baseplate. The center screw was tightened.   We then repositioned the arm to give access to the humeral shaft fragment.  Drill holes were placed and fiberwire sutures in the shaft for vertical fixation of the tuberosities.  We broached with the Perform humeral fracture stem implants  up to size listed above which obtained an appropriate fit.    We tried multiple options for sizes settling on the above.  The shoulder was trialed.  There was good ROM in all planes and the shoulder was stable with no inferior translation.   We then mobilized her tuberosities again and placed the anterior deep limbs of the 4 #5 fiber wires around the stem.  1 of these was tied down fixing the greater tuberosity  in place after bone graft harvest from the humeral head component was placed underneath.   The joint was reduced and thoroughly irrigated with pulsatile lavage. The remaining sutures were then placed through the subscapularis and the bone tendon junction and the tuberosities were reduced after bone graft placed beneath as autograft at the subscap.  We horizontally secured the tuberosities before placing vertical fixation with the suture that was placed into the shaft.  Tuberosities moved as a unit were happy with the overall reduction.    We irrigated copiously at this point.  Hemostasis was obtained. The deltopectoral interval was  reapproximated with #1 Ethibond. The subcutaneous tissues were closed with 3-0 Vicryl and the skin was closed with running nylon.     Aleck Stalling, PA-C, present and scrubbed throughout the case, critical for completion in a timely fashion, and for retraction, instrumentation, closure.

## 2024-07-07 NOTE — Interval H&P Note (Signed)
 I spoke to the patient at length. We discussed reverse total shoulder, in the setting of a fractured dislocation.  Specific risks, including but not limited to periprosthetic fracture, dislocation, and neurovascular injury were discussed.  His ancillary nerve sensation is somewhat altered however, I'm not able to test deltoid function with a fracture.  We also discussed pain medicine. He is on 15 mg of oxycodone  multiple times a day at baseline.  I was very clear that his baseline medicine would be his continued medicine however he says he's out. It is his responsibility to speak to his pain clinic about this. We will prescribe only the standard medicines after surgery including 30 -5 mg oxycodone  pills and we will not refill under any circumstance

## 2024-07-07 NOTE — Anesthesia Postprocedure Evaluation (Signed)
 Anesthesia Post Note  Patient: Timothy [name]  Procedure(s) Performed: RIGHT REVERSE TOTAL SHOULDER ARTHROPLASTY FOR FRACTURE (Right: Shoulder)     Patient location during evaluation: PACU Anesthesia Type: Regional and General Level of consciousness: awake and alert Pain management: pain level controlled Vital Signs Assessment: post-procedure vital signs reviewed and stable Respiratory status: spontaneous breathing, nonlabored ventilation, respiratory function stable and patient connected to nasal cannula oxygen Cardiovascular status: blood pressure returned to baseline and stable Postop Assessment: no apparent nausea or vomiting Anesthetic complications: no   No notable events documented.  Last Vitals:  Vitals:   07/07/24 1409 07/07/24 1415  BP: 132/78 130/85  Pulse: 81 80  Resp: (!) 21 (!) 22  Temp: 36.6 C   SpO2: 100% 99%    Last Pain:  Vitals:   07/07/24 1436  TempSrc:   PainSc: 4                  Garnette DELENA Gab

## 2024-07-07 NOTE — Anesthesia Procedure Notes (Signed)
 Procedure Name: Intubation Date/Time: 07/07/2024 12:24 PM  Performed by: [place], Corean BROCKS, CRNAPre-anesthesia Checklist: Patient identified, Emergency Drugs available, Suction available and Patient being monitored Patient Re-evaluated:Patient Re-evaluated prior to induction Oxygen Delivery Method: Circle system utilized Preoxygenation: Pre-oxygenation with 100% oxygen Induction Type: IV induction Ventilation: Mask ventilation without difficulty Laryngoscope Size: Mac and 4 Grade View: Grade I Tube type: Oral Tube size: 7.5 mm Number of attempts: 1 Airway Equipment and Method: Stylet and Oral airway Placement Confirmation: ETT inserted through vocal cords under direct vision, positive ETCO2 and breath sounds checked- equal and bilateral Secured at: 21 cm Tube secured with: Tape Dental Injury: Teeth and Oropharynx as per pre-operative assessment

## 2024-07-07 NOTE — Anesthesia Preprocedure Evaluation (Addendum)
 Anesthesia Evaluation  Patient identified by MRN, date of birth, ID band Patient awake    Reviewed: Allergy & Precautions, NPO status , Patient's Chart, lab work & pertinent test results  History of Anesthesia Complications (+) DIFFICULT AIRWAY and history of anesthetic complications  Airway Mallampati: III  TM Distance: >3 FB Neck ROM: Full    Dental  (+) Chipped, Missing   Pulmonary sleep apnea , Current Smoker and Patient abstained from smoking.   Pulmonary exam normal        Cardiovascular hypertension, Pt. on medications and Pt. on home beta blockers + CAD and +CHF  Normal cardiovascular exam+ dysrhythmias   ECHO: 1. No LV thrombus by Definity. Left ventricular ejection fraction, by  estimation, is 50 to 55%. The left ventricle has low normal function. The  left ventricle demonstrates regional wall motion abnormalities, basal  septal hypokinesis. There is moderate  left ventricular hypertrophy of the septal segment. Left ventricular  diastolic parameters are consistent with Grade I diastolic dysfunction  (impaired relaxation). Elevated left ventricular end-diastolic pressure.  2. Right ventricular systolic function is normal. The right ventricular  size is normal. Tricuspid regurgitation signal is inadequate for assessing  PA pressure.  3. The mitral valve is abnormal. No evidence of mitral valve  regurgitation. No evidence of mitral stenosis. Moderate mitral annular  calcification.  4. The aortic valve has an indeterminant number of cusps. Aortic valve  regurgitation is not visualized. No aortic stenosis is present.     Neuro/Psych  PSYCHIATRIC DISORDERS Anxiety     TIA   GI/Hepatic PUD,GERD  Medicated and Controlled,,(+) Cirrhosis     substance abuse  alcohol  use, Hepatitis -  Endo/Other  negative endocrine ROS    Renal/GU Renal disease     Musculoskeletal  (+) Arthritis ,  narcotic dependentChronic pain    Abdominal   Peds  Hematology  (+) Blood dyscrasia, anemia   Anesthesia Other Findings proximal humerus fracture dislocation  Reproductive/Obstetrics                              Anesthesia Physical Anesthesia Plan  ASA: 3  Anesthesia Plan: Regional and General   Post-op Pain Management: Regional block*   Induction: Intravenous  PONV Risk Score and Plan: 1 and Ondansetron , Dexamethasone , Midazolam  and Treatment may vary due to age or medical condition  Airway Management Planned: Oral ETT  Additional Equipment:   Intra-op Plan:   Post-operative Plan: Extubation in OR  Informed Consent: I have reviewed the patients History and Physical, chart, labs and discussed the procedure including the risks, benefits and alternatives for the proposed anesthesia with the patient or authorized representative who has indicated his/her understanding and acceptance.     Dental advisory given  Plan Discussed with: CRNA  Anesthesia Plan Comments:          Anesthesia Quick Evaluation

## 2024-07-08 ENCOUNTER — Encounter (HOSPITAL_COMMUNITY): Payer: Self-pay | Admitting: Orthopaedic Surgery

## 2024-07-08 DIAGNOSIS — S42351A Displaced comminuted fracture of shaft of humerus, right arm, initial encounter for closed fracture: Secondary | ICD-10-CM | POA: Diagnosis not present

## 2024-07-08 LAB — BASIC METABOLIC PANEL WITH GFR
Anion gap: 9 (ref 5–15)
BUN: 19 mg/dL (ref 6–20)
CO2: 26 mmol/L (ref 22–32)
Calcium: 8.2 mg/dL — ABNORMAL LOW (ref 8.9–10.3)
Chloride: 96 mmol/L — ABNORMAL LOW (ref 98–111)
Creatinine, Ser: 1.29 mg/dL — ABNORMAL HIGH (ref 0.61–1.24)
GFR, Estimated: >60 mL/min (ref >60)
Glucose, Bld: 128 mg/dL — ABNORMAL HIGH (ref 70–99)
Potassium: 3.6 mmol/L (ref 3.5–5.1)
Sodium: 130 mmol/L — ABNORMAL LOW (ref 135–145)

## 2024-07-08 LAB — AMMONIA: Ammonia: 34 umol/L (ref 9–35)

## 2024-07-08 MED ORDER — ASPIRIN 81 MG PO CHEW: 81.0000 mg | CHEWABLE_TABLET | Freq: Every day | ORAL | 0 refills | Status: AC

## 2024-07-08 MED ORDER — BUPIVACAINE LIPOSOME 1.3 % IJ SUSP
INTRAMUSCULAR | Status: DC | PRN
  Administered 2024-07-07: 10 mL via PERINEURAL

## 2024-07-08 MED ORDER — ONDANSETRON HCL 4 MG PO TABS: 4.0000 mg | ORAL_TABLET | Freq: Three times a day (TID) | ORAL | 0 refills | Status: AC | PRN

## 2024-07-08 MED ORDER — OXYCODONE HCL 5 MG PO TABS: ORAL_TABLET | ORAL | 0 refills | Status: AC

## 2024-07-08 MED ORDER — CELECOXIB 200 MG PO CAPS: 200.0000 mg | ORAL_CAPSULE | Freq: Two times a day (BID) | ORAL | 0 refills | Status: AC

## 2024-07-08 MED ORDER — ACETAMINOPHEN 500 MG PO TABS: 500.0000 mg | ORAL_TABLET | Freq: Three times a day (TID) | ORAL | 0 refills | Status: AC

## 2024-07-08 NOTE — Progress Notes (Signed)
 Patient refusing Ice Man chest at this time. Patient educated on the need for cold therapy to help reduce swelling and assist with pain. Patient reports he needs the ice off to be able to go to sleep.

## 2024-07-08 NOTE — Anesthesia Procedure Notes (Signed)
 Anesthesia Regional Block: Interscalene brachial plexus block   Pre-Anesthetic Checklist: , timeout performed,  Correct Patient, Correct Site, Correct Laterality,  Correct Procedure, Correct Position, site marked,  Risks and benefits discussed,  Surgical consent,  Pre-op evaluation,  At surgeon's request and post-op pain management  Laterality: Upper and Right  Prep: Maximum Sterile Barrier Precautions used, chloraprep       Needles:  Injection technique: Single-shot  Needle Type: Echogenic Needle     Needle Length: 5cm  Needle Gauge: 21     Additional Needles:   Procedures:,,,, ultrasound used (permanent image in chart),,    Narrative:  Start time: 07/07/2024 11:48 AM End time: 07/07/2024 11:54 AM Injection made incrementally with aspirations every 5 mL.  Performed by: Personally  Anesthesiologist: Jefm Garnette LABOR, MD  Additional Notes: Block assessed prior to procedure. Patient tolerated procedure well.

## 2024-07-08 NOTE — Discharge Summary (Signed)
 Physician Discharge Summary  Timothy [name] FMW:[id number] DOB: [birth year] DOA: 07/03/2024  PCP: Bertell Satterfield, MD  Admit date: 07/03/2024 Discharge date: 07/08/2024  Admitted From: Home Disposition:  Home  Recommendations for Outpatient Follow-up:  Follow up with orthopedic surgery in 4 weeks Please obtain BMP/CBC in one week   Home Health: Yes Equipment/Devices:None  Discharge Condition:Stable CODE STATUS:Full Diet recommendation: Heart Healthy   Brief/Interim Summary: 53 y.o. male past medical history of opioid dependent, HFpEF, Barrett esophagus Wolff-Parkinson-White status post ablation, history of nonischemic cardiomyopathy, alcohol  abuse liver cirrhosis comes in with right arm pain after mechanical fall about 10 days prior to admission while playing with his son   Discharge Diagnoses:  Principal Problem:   Closed comminuted right humeral fracture Active Problems:   Alcoholic cirrhosis (HCC)   Essential hypertension   Malignant hypertension   ETOH abuse   Chronic combined systolic (congestive) and diastolic (congestive) heart failure (HCC)   Nausea and vomiting   Alcohol  abuse   Uncontrolled pain  Closed comminuted right humeral fracture: Orthopedic surgery was consulted and is status post ORIF of the right humerus. Orthopedic surgery recommended aspirin  for DVT prophylaxis. He was also sent home on analgesics.  Acute metabolic encephalopathy: Unclear etiology, EEG was unremarkable ammonia level was unremarkable, B12 and TSH were unremarkable. UDS was positive for cannabis.  Intractable nausea vomiting: Likely due to narcotics and cannabis. GI was consulted recommended conservative treatment.  Acute kidney injury: Likely prerenal azotemia resolved with IV fluids. And February with holding of his diuretics.  Alcoholic cirrhosis: No signs of withdrawal he will resume his medication as an outpatient.  Alcohol  abuse: Was monitored with CIWA protocol no  signs of withdrawal.  Essential hypertension: No changes made to his medication.  Chronic pain syndrome: No changes made to his medication.  Expected of HFpEF: Continue beta-blockers no changes made to his medication.  Electrolyte imbalance: They were corrected now improved.  Discharge Instructions  Discharge Instructions     Diet - low sodium heart healthy   Complete by: As directed    Increase activity slowly   Complete by: As directed    No wound care   Complete by: As directed       Allergies as of 07/08/2024   No Active Allergies      Medication List     TAKE these medications    acetaminophen  500 MG tablet Commonly known as: TYLENOL  Take 1 tablet (500 mg total) by mouth every 8 (eight) hours for 14 days.   albuterol  108 (90 Base) MCG/ACT inhaler Commonly known as: VENTOLIN  HFA Inhale 2 puffs into the lungs every 6 (six) hours as needed for wheezing or shortness of breath.   aspirin  81 MG chewable tablet Commonly known as: Aspirin  Childrens Chew 1 tablet (81 mg total) by mouth daily. For 4 weeks for DVT prophylaxis after surgery   carvedilol  12.5 MG tablet Commonly known as: COREG  Take 6.25-12.5 mg by mouth See admin instructions. 6.25mg  in the morning and 12.5mg  at bedtime   celecoxib 200 MG capsule Commonly known as: CELEBREX Take 1 capsule (200 mg total) by mouth 2 (two) times daily for 14 days. What changed: when to take this   cloNIDine  0.3 mg/24hr patch Commonly known as: CATAPRES  - Dosed in mg/24 hr Place 0.3 mg onto the skin once a week.   cyclobenzaprine 10 MG tablet Commonly known as: FLEXERIL Take 10 mg by mouth 2 (two) times daily.   folic acid  1 MG tablet Commonly known as:  FOLVITE  Take 1 tablet (1 mg total) by mouth daily.   furosemide  20 MG tablet Commonly known as: LASIX  Take 1 tablet (20 mg total) by mouth daily. What changed: how much to take   gabapentin  100 MG capsule Commonly known as: NEURONTIN  Take 2 capsules (200  mg total) by mouth 3 (three) times daily. What changed: when to take this   hydrOXYzine  25 MG tablet Commonly known as: ATARAX  Take 1 tablet (25 mg total) by mouth 3 (three) times daily as needed for anxiety or nausea.   lactulose  10 GM/15ML solution Commonly known as: CHRONULAC  Take 45 mLs (30 g total) by mouth 2 (two) times daily. What changed:  when to take this reasons to take this   lubiprostone 8 MCG capsule Commonly known as: AMITIZA Take 8 mcg by mouth daily as needed for constipation.   ondansetron  4 MG tablet Commonly known as: Zofran  Take 1 tablet (4 mg total) by mouth every 8 (eight) hours as needed for up to 7 days for nausea or vomiting.   oxyCODONE  5 MG immediate release tablet Commonly known as: Oxy IR/ROXICODONE  Take 1-2 pills every 6 hrs as needed for severe pain, no more than 6 per day What changed:  medication strength how much to take how to take this when to take this reasons to take this additional instructions   pantoprazole  40 MG tablet Commonly known as: PROTONIX  Take 40 mg by mouth 2 (two) times daily.   potassium chloride  SA 20 MEQ tablet Commonly known as: KLOR-CON  M Take 2 tablets (40 mEq total) by mouth daily. What changed: when to take this   spironolactone  50 MG tablet Commonly known as: ALDACTONE  Take 1 tablet (50 mg total) by mouth daily.   thiamine  100 MG tablet Commonly known as: Vitamin B-1 Take 1 tablet (100 mg total) by mouth daily.   traZODone  100 MG tablet Commonly known as: DESYREL  Take 1 tablet (100 mg total) by mouth at bedtime. What changed:  when to take this reasons to take this   Vitamin D3 25 MCG (1000 UT) Caps Take 2 capsules by mouth every evening.               Durable Medical Equipment  (From admission, onward)           Start     Ordered   07/06/24 1320  For home use only DME 3 n 1  Once        07/06/24 1319            Follow-up Information     [facility]  Follow up.   Contact information: [facility] [phone] [address] [place], [place] [address] Call or walk in between 5:30am - 11:00Am        Cristy Bonner DASEN, MD Follow up in 2 week(s).   Specialty: Orthopedic Surgery Why: For suture removal Contact information: [address] [place] [address] [phone]                No Active Allergies  Consultations: Cardiology Orthopedic surgery GI    Procedures/Studies: DG Shoulder Right Port Result Date: 07/07/2024 CLINICAL DATA:  Postop right shoulder. EXAM: RIGHT SHOULDER - 1 VIEW COMPARISON:  Prior greater radiograph FINDINGS: Reverse left shoulder arthroplasty in expected alignment. Fracture about the humeral stem was present on preoperative imaging. Recent postsurgical change includes air and edema in the joint space and soft tissues. IMPRESSION: Reverse left shoulder arthroplasty in  expected alignment. Fracture about the humeral stem was present on preoperative imaging. Electronically Signed   By: Andrea Gasman M.D.   On: 07/07/2024 15:35   EEG adult Result Date: 07/05/2024 Shelton Arlin KIDD, MD     07/05/2024  8:26 AM Patient Name: STYLES FAMBRO MRN: [id number] Epilepsy Attending: Arlin KIDD Shelton Referring Physician/Provider: Evonnie Lenis, MD Date: 07/04/2024 Duration: 23.44 mins Patient history: 53yo M with ams. EEG to evaluate for seizure Level of alertness: Awake, asleep AEDs during EEG study: GBP, Ativan  Technical aspects: This EEG study was done with scalp electrodes positioned according to the 10-20 International system of electrode placement. Electrical activity was reviewed with band pass filter of 1-70Hz , sensitivity of 7 uV/mm, display speed of 17mm/sec with a 60Hz  notched filter applied as appropriate. EEG data were recorded continuously and digitally stored.  Video monitoring was available and reviewed as appropriate. Description: The posterior  dominant rhythm consists of 8Hz  activity of moderate voltage (25-35 uV) seen predominantly in posterior head regions, symmetric and reactive to eye opening and eye closing. Sleep was characterized by vertex waves, sleep spindles (12 to 14 Hz), maximal frontocentral region. Hyperventilation and photic stimulation were not performed.   IMPRESSION: This study is within normal limits. No seizures or epileptiform discharges were seen throughout the recording. A normal interictal EEG does not exclude the diagnosis of epilepsy. Priyanka KIDD Shelton   CT HEAD WO CONTRAST ( ) Result Date: 07/04/2024 CLINICAL DATA:  Fall altered EXAM: CT HEAD WITHOUT CONTRAST TECHNIQUE: Contiguous axial images were obtained from the base of the skull through the vertex without intravenous contrast. RADIATION DOSE REDUCTION: This exam was performed according to the departmental dose-optimization program which includes automated exposure control, adjustment of the mA and/or kV according to patient size and/or use of iterative reconstruction technique. COMPARISON:  CT brain 09/24/2021 FINDINGS: Brain: Mild motion degradation. No acute territorial infarction, hemorrhage or intracranial mass. Mild atrophy. Nonenlarged ventricles. Vascular: No hyperdense vessels.  Carotid vascular calcification Skull: No fracture Sinuses/Orbits: No acute finding. Other: None IMPRESSION: Mild motion degradation. No CT evidence for acute intracranial abnormality. Mild atrophy. Electronically Signed   By: Luke Bun M.D.   On: 07/04/2024 17:40   ECHOCARDIOGRAM COMPLETE Result Date: 07/04/2024    ECHOCARDIOGRAM REPORT   Patient Name:   ISMAEL KARGE Date of Exam: 07/04/2024 Medical Rec #:  [id number]       Height:       72.0 in Accession #:    [id number]      Weight:       202.2 lb Date of Birth:  07-Aug-1971       BSA:          2.140 m Patient Age:    53 years        BP:           150/108 mmHg Patient Gender: M               HR:           97 bpm. Exam  Location:  [facility] Procedure: 2D Echo, Cardiac Doppler, Color Doppler and Intracardiac            Opacification Agent (Both Spectral and Color Flow Doppler were            utilized during procedure). Indications:    Chest Pain R07.9  History:        Patient has prior history of Echocardiogram examinations, most  recent 12/19/2020. CHF and Cardiomyopathy, CAD, Arrythmias:RBBB,                 Signs/Symptoms:Syncope; Risk Factors:Current Smoker,                 Hypertension and Dyslipidemia. Hx of ETOH abuse and                 Wolff-Parkinson-White (WPW) syndrome.  Sonographer:    Aida Pizza RCS Referring Phys: [phone] DAVID TAT IMPRESSIONS  1. No LV thrombus by Definity. Left ventricular ejection fraction, by estimation, is 50 to 55%. The left ventricle has low normal function. The left ventricle demonstrates regional wall motion abnormalities, basal septal hypokinesis. There is moderate left ventricular hypertrophy of the septal segment. Left ventricular diastolic parameters are consistent with Grade I diastolic dysfunction (impaired relaxation). Elevated left ventricular end-diastolic pressure.  2. Right ventricular systolic function is normal. The right ventricular size is normal. Tricuspid regurgitation signal is inadequate for assessing PA pressure.  3. The mitral valve is abnormal. No evidence of mitral valve regurgitation. No evidence of mitral stenosis. Moderate mitral annular calcification.  4. The aortic valve has an indeterminant number of cusps. Aortic valve regurgitation is not visualized. No aortic stenosis is present. FINDINGS  Left Ventricle: Left ventricular ejection fraction, by estimation, is 50 to 55%. The left ventricle has low normal function. The left ventricle demonstrates regional wall motion abnormalities. Definity contrast agent was given IV to delineate the left ventricular endocardial borders. Strain was performed and the global longitudinal strain is indeterminate. The  left ventricular internal cavity size was normal in size. There is moderate left ventricular hypertrophy of the septal segment. Left ventricular diastolic parameters are consistent with Grade I diastolic dysfunction (impaired relaxation). Elevated left ventricular end-diastolic pressure. Right Ventricle: The right ventricular size is normal. No increase in right ventricular wall thickness. Right ventricular systolic function is normal. Tricuspid regurgitation signal is inadequate for assessing PA pressure. Left Atrium: Left atrial size was normal in size. Right Atrium: Right atrial size was normal in size. Pericardium: There is no evidence of pericardial effusion. Mitral Valve: The mitral valve is abnormal. Moderate mitral annular calcification. No evidence of mitral valve regurgitation. No evidence of mitral valve stenosis. Tricuspid Valve: The tricuspid valve is normal in structure. Tricuspid valve regurgitation is trivial. No evidence of tricuspid stenosis. Aortic Valve: The aortic valve has an indeterminant number of cusps. Aortic valve regurgitation is not visualized. No aortic stenosis is present. Pulmonic Valve: The pulmonic valve was grossly normal. Pulmonic valve regurgitation is not visualized. No evidence of pulmonic stenosis. Aorta: The aortic root is normal in size and structure. Venous: The inferior vena cava was not well visualized. IAS/Shunts: The interatrial septum was not well visualized. Additional Comments: 3D was performed not requiring image post processing on an independent workstation and was indeterminate.  LEFT VENTRICLE PLAX 2D LVIDd:         5.00 cm   Diastology LVIDs:         3.80 cm   LV e' medial:    4.85 cm/s LV PW:         1.10 cm   LV E/e' medial:  21.2 LV IVS:        1.40 cm   LV e' lateral:   7.34 cm/s LVOT diam:     2.20 cm   LV E/e' lateral: 14.0 LV SV:         57 LV SV Index:   27 LVOT Area:  3.80 cm  RIGHT VENTRICLE RV S prime:     10.70 cm/s TAPSE (M-mode): 2.5 cm LEFT  ATRIUM             Index LA diam:        3.40 cm 1.59 cm/m LA Vol (A2C):   73.4 ml 34.29 ml/m LA Vol (A4C):   55.3 ml 25.84 ml/m LA Biplane Vol: 67.7 ml 31.63 ml/m  AORTIC VALVE LVOT Vmax:   85.70 cm/s LVOT Vmean:  60.000 cm/s LVOT VTI:    0.151 m  AORTA Ao Root diam: 4.00 cm MITRAL VALVE MV Area (PHT): 10.84 cm    SHUNTS MV Decel Time: 70 msec      Systemic VTI:  0.15 m MV E velocity: 103.00 cm/s  Systemic Diam: 2.20 cm Vishnu Priya Mallipeddi Electronically signed by Diannah Late Mallipeddi Signature Date/Time: 07/04/2024/3:33:16 PM    Final    CT Angio Chest PE W and/or Wo Contrast Result Date: 07/04/2024 CLINICAL DATA:  Pulmonary embolism (PE) suspected, low to intermediate prob, positive D-dimer reviewed fall. Bruises. Chest pain and arm pain. Short of breath. EXAM: CT ANGIOGRAPHY CHEST WITH CONTRAST TECHNIQUE: Multidetector CT imaging of the chest was performed using the standard protocol during bolus administration of intravenous contrast. Multiplanar CT image reconstructions and MIPs were obtained to evaluate the vascular anatomy. RADIATION DOSE REDUCTION: This exam was performed according to the departmental dose-optimization program which includes automated exposure control, adjustment of the mA and/or kV according to patient size and/or use of iterative reconstruction technique. CONTRAST:  75mL OMNIPAQUE  IOHEXOL  350 MG/ML SOLN COMPARISON:  Chest x-ray 07/03/2024, CT abdomen 05/01/2023 FINDINGS: Cardiovascular: Satisfactory opacification of the pulmonary arteries to the segmental level. No evidence of pulmonary embolism. Limited evaluation of the subsegmental level due to timing of contrast. Normal heart size. No significant pericardial effusion. Limited evaluation of the aortic root due to motion artifact. Possible ectasia of the aortic root. Otherwise the thoracic aorta is normal in caliber. Mild-to-moderate atherosclerotic plaque of the thoracic aorta. At least 3 vessel coronary artery  calcifications. Mediastinum/Nodes: No enlarged mediastinal, hilar, or axillary lymph nodes. Thyroid  gland, trachea, and esophagus demonstrate no significant findings. Lungs/Pleura: No focal consolidation. No pulmonary nodule. No pulmonary mass. No pleural effusion. No pneumothorax. Upper Abdomen: No acute abnormality. Musculoskeletal: No chest wall abnormality. No suspicious lytic or blastic osseous lesions. Acute markedly comminuted and displaced humerus with fracture fragment superiorly dislocated in relation to the to the acetabula and impacted on the superior glenoid (10:93). Interval development of age-indeterminate L1 superior endplate compression fracture with at least 40% vertebral body height loss. Mild degenerative changes of the spine. Review of the MIP images confirms the above findings. IMPRESSION: 1. No pulmonary embolus with limited evaluation of the subsegmental level due to timing of contrast. 2. Acute markedly comminuted and displaced humerus with fracture fragment superiorly dislocated in relation to the acetabulum and impacted on the superior glenoid. 3. Interval development of age-indeterminate L1 superior endplate compression fracture with at least 40% vertebral body height loss. Correlate with point tenderness to palpation to evaluate for an acute component. 4. Limited evaluation of the aortic root due to motion artifact. Possible ectasia of the aortic root. 5. Aortic Atherosclerosis (ICD10-I70.0) including three-vessel coronary artery calcification. Electronically Signed   By: Morgane  Naveau M.D.   On: 07/04/2024 03:49   DG Chest 2 View Result Date: 07/03/2024 EXAM: 2 VIEW(S) XRAY OF THE CHEST 07/03/2024 11:49:04 PM COMPARISON: Chest x-ray 09/17/2022, x-ray right shoulder 07/03/2024. ct chest 09/27/20  CLINICAL HISTORY: eval for fracture. Pt to ED via EMS from home with c/o right arm pain, pt fell a week ago, entire right arm bruised in various stages, EMS report pt had couple of syncopal  episodes last week, pt is currently sinus tach with c/o sob, arm pain, and chest pain. ; Did not raise R arm on lateral as we are eval for fracture of humerus FINDINGS: LUNGS AND PLEURA: No focal pulmonary opacity. No pulmonary edema. No pleural effusion. No pneumothorax. HEART AND MEDIASTINUM: No acute abnormality of the cardiac and mediastinal silhouettes. BONES AND SOFT TISSUES: Partially visualized right shoulder fracture - please see separately dictated x-ray right shoulder at 07/03/2024. IMPRESSION: 1. No acute cardiopulmonary disease. 2. Partially visualized right shoulder fracture; refer to the separately dictated right shoulder radiograph for dedicated evaluation. Electronically signed by: Morgane Naveau MD 07/03/2024 11:56 PM EDT RP Workstation: [id number]   DG Humerus Right Result Date: 07/03/2024 EXAM: 2 VIEW(S) XRAY OF THE RIGHT HUMERUS 07/03/2024 11:49:04 PM COMPARISON: None available. CLINICAL HISTORY: eval for fracture. Pt to ED via EMS from home with c/o right arm pain, pt fell a week ago, entire right arm bruised in various stages, EMS report pt had couple of syncopal episodes last week, pt is currently sinus tach with c/o sob, arm pain, and chest pain. FINDINGS: BONES AND JOINTS: Acute displaced and comminuted right humeral head and neck fracture. No definite dislocation from this 2 view only radiograph. SOFT TISSUES: Soft tissues edema. IMPRESSION: 1. Acute displaced and comminuted right humeral head and neck fracture. 2. No definite dislocation on this 2-view radiograph. Electronically signed by: Kate Plummer MD 07/03/2024 11:54 PM EDT RP Workstation: [id number]   (Echo, Carotid, EGD, Colonoscopy, ERCP)    Subjective: No complaints  Discharge Exam: Vitals:   07/07/24 1539 07/08/24 0556  BP: 126/74 101/76  Pulse: 82 86  Resp: 18 18  Temp: 97.6 F (36.4 C) (!) 97.5 F (36.4 C)  SpO2: 98% 97%   Vitals:   07/07/24 1500 07/07/24 1515 07/07/24 1539 07/08/24 0556  BP: 120/79  125/82 126/74 101/76  Pulse: 80 77 82 86  Resp: 16 17 18 18   Temp:  97.6 F (36.4 C) 97.6 F (36.4 C) (!) 97.5 F (36.4 C)  TempSrc:   Oral Oral  SpO2: 95% 95% 98% 97%  Weight:      Height:        General: Pt is alert, awake, not in acute distress Cardiovascular: RRR, S1/S2 +, no rubs, no gallops Respiratory: CTA bilaterally, no wheezing, no rhonchi Abdominal: Soft, NT, ND, bowel sounds + Extremities: no edema, no cyanosis    The results of significant diagnostics from this hospitalization (including imaging, microbiology, ancillary and laboratory) are listed below for reference.     Microbiology: Recent Results (from the past 240 hours)  Surgical pcr screen     Status: None   Collection Time: 07/07/24  6:37 AM   Specimen: Nasal Mucosa; Nasal Swab  Result Value Ref Range Status   MRSA, PCR NEGATIVE NEGATIVE Final   Staphylococcus aureus NEGATIVE NEGATIVE Final    Comment: (NOTE) The Xpert SA Assay (FDA approved for NASAL specimens in patients 23 years of age and older), is one component of a comprehensive surveillance program. It is not intended to diagnose infection nor to guide or monitor treatment. Performed at [facility], [address], [place], [place] [address]      Labs: BNP (last 3 results) No results for input(s): BNP in the  last 8760 hours. Basic Metabolic Panel: Recent Labs  Lab 07/04/24 0016 07/04/24 1130 07/05/24 0335 07/06/24 0409 07/08/24 0322  NA 133*  --  127* 132* 130*  K 3.6  --  3.0* 3.3* 3.6  CL 93*  --  85* 92* 96*  CO2 22  --  27 28 26   GLUCOSE 128*  --  84 82 128*  BUN 16  --  26* 25* 19  CREATININE 1.16  --  1.50* 1.28* 1.29*  CALCIUM  8.5*  --  8.9 9.1 8.2*  MG  --  1.6* 1.9 2.1  --   PHOS  --  2.1* 2.2* 4.1  --    Liver Function Tests: Recent Labs  Lab 07/04/24 0016 07/05/24 0335 07/06/24 0409  AST 82* 65* 63*  ALT 33 30 27  ALKPHOS 166* 148* 151*  BILITOT 1.9* 2.6* 2.4*  PROT 7.7 7.1 6.9   ALBUMIN  4.0 3.8 3.6   No results for input(s): LIPASE, AMYLASE in the last 168 hours. Recent Labs  Lab 07/04/24 1130 07/05/24 0948 07/06/24 0409 07/08/24 0322  AMMONIA 72* 51* 28 34   CBC: Recent Labs  Lab 07/04/24 0016 07/05/24 0335  WBC 10.3 7.0  NEUTROABS 7.1  --   HGB 12.0* 11.5*  HCT 35.8* 35.4*  MCV 83.4 86.1  PLT 274 209   Cardiac Enzymes: Recent Labs  Lab 07/04/24 0016  CKTOTAL 255   BNP: Invalid input(s): POCBNP CBG: No results for input(s): GLUCAP in the last 168 hours. D-Dimer No results for input(s): DDIMER in the last 72 hours. Hgb A1c No results for input(s): HGBA1C in the last 72 hours. Lipid Profile No results for input(s): CHOL, HDL, LDLCALC, TRIG, CHOLHDL, LDLDIRECT in the last 72 hours. Thyroid  function studies No results for input(s): TSH, T4TOTAL, T3FREE, THYROIDAB in the last 72 hours.  Invalid input(s): FREET3 Anemia work up No results for input(s): VITAMINB12, FOLATE, FERRITIN, TIBC, IRON, RETICCTPCT in the last 72 hours. Urinalysis    Component Value Date/Time   COLORURINE YELLOW 07/04/2024 1126   APPEARANCEUR CLEAR 07/04/2024 1126   LABSPEC 1.023 07/04/2024 1126   PHURINE 6.0 07/04/2024 1126   GLUCOSEU NEGATIVE 07/04/2024 1126   HGBUR SMALL (A) 07/04/2024 1126   BILIRUBINUR NEGATIVE 07/04/2024 1126   KETONESUR NEGATIVE 07/04/2024 1126   PROTEINUR 100 (A) 07/04/2024 1126   UROBILINOGEN 0.2 10/29/2014 0937   NITRITE NEGATIVE 07/04/2024 1126   LEUKOCYTESUR NEGATIVE 07/04/2024 1126   Sepsis Labs Recent Labs  Lab 07/04/24 0016 07/05/24 0335  WBC 10.3 7.0   Microbiology Recent Results (from the past 240 hours)  Surgical pcr screen     Status: None   Collection Time: 07/07/24  6:37 AM   Specimen: Nasal Mucosa; Nasal Swab  Result Value Ref Range Status   MRSA, PCR NEGATIVE NEGATIVE Final   Staphylococcus aureus NEGATIVE NEGATIVE Final    Comment: (NOTE) The Xpert SA Assay  (FDA approved for NASAL specimens in patients 46 years of age and older), is one component of a comprehensive surveillance program. It is not intended to diagnose infection nor to guide or monitor treatment. Performed at [facility], [address], [place], [place] [address]      Time coordinating discharge: Over 35 minutes  SIGNED:   Erle Odell Castor, MD  [facility] 07/08/2024, 10:20 AM Pager   If 7PM-7AM, please contact night-coverage [email or web address] Password [email or web address]

## 2024-07-08 NOTE — Progress Notes (Addendum)
 Physical Therapy Treatment Patient Details Name: Timothy [name] MRN: [id number] DOB: 1971/07/25 Today's Date: 07/08/2024   History of Present Illness 53 yo male presents to therapy following presentation to [facility] on 07/03/2024 due to fall 10 day prior , chest pain and R UE pain. Pt found to have R proximal humerus fx and is s/p R TSA on 10/23. Pt R UE in sling and NWB at this time. Pt PMH includes but is not limited to: substance abuse, falls, anxiety, arthritis, CAD, OSA, GERD, chronic pain, HTN, wolff-parkinson white syndrome s/p ablation, cardiomyopathy, cirrhosis of the liver and barrett esophagus.    PT Comments   Timothy [name] is a 53 y.o. male POD 0 s/p R reverse TSA. Patient reports mod I with mobility at baseline. Patient is now limited by functional impairments (see PT problem list below) and is mod I for bed mobility and S to CGA for transfers. Patient was able to ambulate 150 feet x 2 without AD and S to CGA level of assist.  Pt ed provided on step navigation with L handrail and min cues for safety and sequencing with pt demonstrating reciprocal pattern. Pt ed provided on R UE NWB, finger and wrist ROM ok, sling placement, clothing options for s/p shoulder sx for increased ease with don/doff. Patient will benefit from continued skilled PT interventions to address impairments and progress towards PLOF. Acute PT will follow to progress mobility and stair training in preparation for safe discharge home.    If plan is discharge home, recommend the following: A little help with walking and/or transfers;Assistance with cooking/housework;A little help with bathing/dressing/bathroom;Help with stairs or ramp for entrance;Assist for transportation   Can travel by private vehicle        Equipment Recommendations  None recommended by PT    Recommendations for Other Services       Precautions / Restrictions Precautions Precautions: Fall;Shoulder Type of Shoulder Precautions: no UE  WB Shoulder Interventions: Shoulder sling/immobilizer Recall of Precautions/Restrictions: Intact Required Braces or Orthoses: Sling Restrictions Weight Bearing Restrictions Per Provider Order: Yes RUE Weight Bearing Per Provider Order: Non weight bearing     Mobility  Bed Mobility Overal bed mobility: Modified Independent                  Transfers Overall transfer level: Needs assistance Equipment used: None Transfers: Sit to/from Stand Sit to Stand: Supervision, Contact guard assist           General transfer comment: pt able to initlaly perform transfer tasks with S and min cues with fatigue pt required CGA to min A to boost up with L UE    Ambulation/Gait Ambulation/Gait assistance: Supervision, Contact guard assist Gait Distance (Feet): 150 Feet Assistive device: 1 person hand held assist Gait Pattern/deviations: Narrow base of support, Step-through pattern Gait velocity: decreased     General Gait Details: gait in personal room mod I for short distances bed <> bathroom no overt LOB, gait tasks 150 feet to therapy gym with close S and min cues, return amb bout of 150 feet pt requested HHA and stated that he uses a SPC for polonged distances and outdoor navigation and that he was feeling fatigued and unsteady   Stairs Stairs: Yes Stairs assistance: Contact guard assist Stair Management: One rail Left Number of Stairs: 5 General stair comments: min cues for safety and sequencing   Wheelchair Mobility     Tilt Bed    Modified Rankin (Stroke Patients Only)  Balance Overall balance assessment: Needs assistance Sitting-balance support: Feet supported, Single extremity supported Sitting balance-Leahy Scale: Good     Standing balance support: During functional activity, Reliant on assistive device for balance Standing balance-Leahy Scale: Good Standing balance comment: no UE support and no overt LOB, pt reports instabiltiy with fatigue with a  significant fall hx pt stating he falls 2-3 times a month                            Communication Communication Communication: No apparent difficulties  Cognition Arousal: Alert Behavior During Therapy: WFL for tasks assessed/performed   PT - Cognitive impairments: No apparent impairments                         Following commands: Intact      Cueing Cueing Techniques: Verbal cues, Tactile cues  Exercises      General Comments        Pertinent Vitals/Pain Pain Assessment Pain Assessment: 0-10 Pain Score: 7  Pain Location: R. Shoulder, and Back Pain Descriptors / Indicators: Grimacing, Operative site guarding, Aching, Constant, Discomfort Pain Intervention(s): Limited activity within patient's tolerance, Monitored during session, Premedicated before session, Repositioned, Ice applied    Home Living                          Prior Function            PT Goals (current goals can now be found in the care plan section) Acute Rehab PT Goals Patient Stated Goal: Pt. wants to return hom with family PT Goal Formulation: With patient Time For Goal Achievement: 07/13/24 Potential to Achieve Goals: Good Progress towards PT goals: Progressing toward goals    Frequency    Min 3X/week      PT Plan      Co-evaluation              AM-PAC PT 6 Clicks Mobility   Outcome Measure  Help needed turning from your back to your side while in a flat bed without using bedrails?: None Help needed moving from lying on your back to sitting on the side of a flat bed without using bedrails?: A Little Help needed moving to and from a bed to a chair (including a wheelchair)?: A Little Help needed standing up from a chair using your arms (e.g., wheelchair or bedside chair)?: A Little Help needed to walk in hospital room?: A Little Help needed climbing 3-5 steps with a railing? : A Little 6 Click Score: 19    End of Session Equipment Utilized  During Treatment: Gait belt Activity Tolerance: Patient limited by fatigue;Patient limited by pain Patient left: in chair;with call bell/phone within reach;with chair alarm set Nurse Communication: Mobility status PT Visit Diagnosis: Unsteadiness on feet (R26.81);Repeated falls (R29.6);Muscle weakness (generalized) (M62.81)     Time: 8979-8941 PT Time Calculation (min) (ACUTE ONLY): 38 min  Charges:    $Gait Training: 8-22 mins $Therapeutic Activity: 23-37 mins PT General Charges $$ ACUTE PT VISIT: 1 Visit                     [place], PT Acute Rehab   [place] VEAR Drone 07/08/2024, 1:53 PM

## 2024-07-08 NOTE — Care Management Important Message (Signed)
 Important Message  Patient Details  Name: Timothy [name] MRN: [id number] Date of Birth: 10/14/1970   Important Message Given:        Glade Cuff 07/08/2024, 12:42 PM

## 2024-07-08 NOTE — Progress Notes (Signed)
 ORTHOPAEDIC PROGRESS NOTE  s/p Procedure(s): RIGHT REVERSE TOTAL SHOULDER ARTHROPLASTY FOR FRACTURE  SUBJECTIVE: Reports mild pain about operative site. Starting to feel shoulder a little bit more. He wants to go home. Having trouble with solids because of throat irritation.   OBJECTIVE: PE: General: sitting up in hospital bed, NAD RUE: Dressing CDI and sling well fitting,  full and painless ROM throughout hand with DPC of 0.  Axillary nerve sensation/motor altered in setting of block and unable to be fully tested.  Distal motor and sensory altered in setting of block.   Vitals:   07/07/24 1539 07/08/24 0556  BP: 126/74 101/76  Pulse: 82 86  Resp: 18 18  Temp: 97.6 F (36.4 C) (!) 97.5 F (36.4 C)  SpO2: 98% 97%    Opiates Today (MME): Today's  total administered Morphine Milligram Equivalents: 62.5 Opiates Yesterday (MME): Yesterday's total administered Morphine Milligram Equivalents: 200 Opiates Used in the last two days:  Inpatient Morphine Milligram Equivalents Per Day 10/19 - 10/24   Values displayed are in units of MME/Day    Order Start / End Date 10/19 10/20 10/21  10/22 Yesterday Today    oxyCODONE  (Oxy IR/ROXICODONE ) immediate release tablet 5 mg 10/23 - 10/23 -- -- -- -- 7.5 of Unknown --    oxyCODONE  (ROXICODONE ) 5 MG/5ML solution 5 mg 10/23 - 10/23 -- -- -- -- 0 of Unknown --          Group total: 7.5 of Unknown     HYDROmorphone  (DILAUDID ) injection 1 mg 10/19 - 10/19 20 of 20 -- -- -- -- --    HYDROmorphone  (DILAUDID ) injection 1 mg 10/20 - 10/20 -- 20 of 20 -- -- -- --    HYDROmorphone  (DILAUDID ) injection 1 mg 10/20 - 10/20 -- 20 of 20 -- -- -- --    HYDROmorphone  (DILAUDID ) injection 1 mg 10/20 - No end date -- 80 of 120 80 of 160 80 of 160 80 of 160 40 of 160    oxyCODONE  (Oxy IR/ROXICODONE ) immediate release tablet 15 mg 10/20 - No end date -- 45 of 67.5 90 of 90 67.5 of 90 37.5 of 90 22.5 of 90    fentaNYL  (SUBLIMAZE ) injection 25-100 mcg 10/23 - 10/23  -- -- -- -- 30 of 7.5-30 --    fentaNYL  (SUBLIMAZE ) injection 25-50 mcg 10/23 - 10/23 -- -- -- -- 15 of 45-90 --    fentaNYL  citrate (PF) (SUBLIMAZE ) injection 10/23 - 10/23 -- -- -- -- *30 of 30 --    Daily Totals  20 of 20 165 of 227.5 170 of 250 147.5 of 250 * 200 of Unknown (at least 332.5-400) 62.5 of 250  *One-Step medication  Calculation Errors     Order Type Date Details   oxyCODONE  (Oxy IR/ROXICODONE ) immediate release tablet 5 mg Ordered Dose -- Insufficient frequency information   oxyCODONE  (ROXICODONE ) 5 MG/5ML solution 5 mg Ordered Dose -- Insufficient frequency information           Stable post-op images  ASSESSMENT: Timothy [name] is a 54 y.o. male POD#1  PLAN: Weightbearing: NWB RUE Insicional and dressing care: Reinforce dressings as needed Orthopedic device(s): Sling Showering: post-op day#3 VTE prophylaxis: Aspirin  81 mg BID Pain control: PRN pain medication, minimize narcotics  Follow - up plan: 2 weeks Dispo: okay to discharge from orthopedic standpoint, once cleared by medicine team. Discharge medications sent electronically to patient's pharmacy.  Contact information:  Weekdays 8-5 Dr. Bonner Hair, Aleck Stalling PA-C, After hours  and holidays please check [email or web address] for group call information for [facility]   Aleck Stalling, PA-C 07/08/24

## 2024-07-08 NOTE — Progress Notes (Signed)
 Patient discharged to home w/ family. Given all belongings, instructions, sling in place. Verbalized understanding of instructions. Escorted to pov via w/c.

## 2024-07-08 NOTE — Addendum Note (Signed)
 Addendum  created 07/08/24 1253 by Jefm Garnette LABOR, MD   Child order released for a procedure order, Clinical Note Signed, Intraprocedure Blocks edited, Intraprocedure Meds edited, SmartForm saved

## 2024-07-08 NOTE — Discharge Instructions (Addendum)
 Bonner Hair MD, MPH Aleck Stalling, PA-C [facility] [address], [address] [phone] (tel)   [phone] (fax)   POST-OPERATIVE INSTRUCTIONS - TOTAL SHOULDER REPLACEMENT    WOUND CARE You may leave the operative dressing in place until your follow-up appointment. KEEP THE INCISIONS CLEAN AND DRY. There may be a small amount of fluid/bleeding leaking at the surgical site. This is normal after surgery.  If it fills with liquid or blood please call us  immediately to change it for you. Use the provided ice machine or Ice packs as often as possible for the first 3-4 days, then as needed for pain relief.   Keep a layer of cloth or a shirt between your skin and the cooling unit to prevent frost bite as it can get very cold.  SHOWERING: - You may shower on Post-Op Day #2.  - The dressing is water  resistant but do not scrub it as it may start to peel up.   - You may remove the sling for showering - Gently pat the area dry.  - Do not soak the shoulder in water .  - Do not go swimming in the pool or ocean until your incision has completely healed (about 4-6 weeks after surgery) - KEEP THE INCISIONS CLEAN AND DRY.  EXERCISES Wear the sling at all times  You may remove the sling for showering, but keep the arm across the chest or in a secondary sling.    Accidental/Purposeful External Rotation and shoulder flexion (reaching behind you) is to be avoided at all costs for the first month. It is ok to come out of your sling if your are sitting and have assistance for eating.   Do not lift anything heavier than 1 pound until we discuss it further in clinic.  It is normal for your fingers/hand to become more swollen after surgery and discolored from bruising.   This will resolve over the first few weeks usually after surgery. Please continue to ambulate and do not stay sitting or lying for too long.  Perform foot and wrist pumps to assist in circulation.  PHYSICAL  THERAPY - No therapy for 4 weeks  REGIONAL ANESTHESIA (NERVE BLOCKS) The anesthesia team may have performed a nerve block for you this is a great tool used to minimize pain.   The block may start wearing off overnight (between 8-24 hours postop) When the block wears off, your pain may go from nearly zero to the pain you would have had postop without the block. This is an abrupt transition but nothing dangerous is happening.   This can be a challenging period but utilize your as needed pain medications to try and manage this period. We suggest you use the pain medication the first night prior to going to bed, to ease this transition.  You may take an extra dose of narcotic when this happens if needed   POST-OP MEDICATIONS- Multimodal approach to pain control In general your pain will be controlled with a combination of substances.  Prescriptions unless otherwise discussed are electronically sent to your pharmacy.  This is a carefully made plan we use to minimize narcotic use.     Celebrex - Anti-inflammatory medication taken on a scheduled basis Acetaminophen  - Non-narcotic pain medicine taken on a scheduled basis  Oxycodone  - This is a strong narcotic, to be used only on an "as needed" basis for SEVERE pain. You will need to contact your pain management provider about any additional refills We will not  prescribe any more narcotics Aspirin  81mg  - This medicine is used to minimize the risk of blood clots after surgery. Zofran  -  take as needed for nausea   FOLLOW-UP If you develop a Fever (>101.5), Redness or Drainage from the surgical incision site, please call our office to arrange for an evaluation. Please call the office to schedule a follow-up appointment for a wound check, 7-10 days post-operatively.  IF YOU HAVE ANY QUESTIONS, PLEASE FEEL FREE TO CALL OUR OFFICE.  HELPFUL INFORMATION  Your arm will be in a sling following surgery. You will be in this sling for the next 4 weeks.    You may be more comfortable sleeping in a semi-seated position the first few nights following surgery.  Keep a pillow propped under the elbow and forearm for comfort.  If you have a recliner type of chair it might be beneficial.  If not that is fine too, but it would be helpful to sleep propped up with pillows behind your operated shoulder as well under your elbow and forearm.  This will reduce pulling on the suture lines.  When dressing, put your operative arm in the sleeve first.  When getting undressed, take your operative arm out last.  Loose fitting, button-down shirts are recommended.  In most states it is against the law to drive while your arm is in a sling. And certainly against the law to drive while taking narcotics.  You may return to work/school in the next couple of days when you feel up to it. Desk work and typing in the sling is fine.  We suggest you use the pain medication the first night prior to going to bed, in order to ease any pain when the anesthesia wears off. You should avoid taking pain medications on an empty stomach as it will make you nauseous.  You should wean off your narcotic medicines as soon as you are able.     Most patients will be off narcotics before their first postop appointment.   Do not drink alcoholic beverages or take illicit drugs when taking pain medications.  Pain medication may make you constipated.  Below are a few solutions to try in this order: Decrease the amount of pain medication if you aren't having pain. Drink lots of decaffeinated fluids. Drink prune juice and/or each dried prunes  If the first 3 don't work start with additional solutions Take Colace - an over-the-counter stool softener Take Senokot - an over-the-counter laxative Take Miralax  - a stronger over-the-counter laxative   Dental Antibiotics:  We require dental prophylaxis for 2 years after a shoulder replacement  Contact your surgeon for an antibiotic prescription,  prior to your dental procedure.   For more information including helpful videos and documents visit our website:   [email or web address]

## 2024-07-08 NOTE — Progress Notes (Signed)
 Occupational Therapy Treatment Patient Details Name: Timothy [name] MRN: [id number] DOB: 09/08/1971 Today's Date: 07/08/2024   History of present illness 53 yr old male who presented to [facility] ED on 07/03/2024 due to fall 10 day prior , chest pain and R UE pain. Pt found to have R proximal humerus fracture and is s/p R reverse total shoulder arthroplasty on 07/07/24. Pt R UE in sling and NWB at this time. Pt PMH includes but is not limited to: substance abuse, falls, anxiety, arthritis, CAD, OSA, GERD, chronic pain, HTN, wolff-parkinson white syndrome s/p ablation, cardiomyopathy, cirrhosis of the liver and barrett esophagus.   OT comments  Pt is s/p shoulder replacement of right dominant upper extremity on 07-07-24. Therapist provided education and instruction to patient with regards to ROM/exercise protocol and restrictions, post-op precautions, upper extremity and sling positioning, dressing techniques, bathing while maintaining shoulder precautions, use of ice for pain and edema management, non-weightbearing status, sling wear schedule and correctly donning/doffing sling. Patient ambulated to and from the bathroom in his room without an assistive device, and he required mod assist to donn an overhead shirt, max assist to donn son joy sling, and SBA to min assist to donn his shorts, socks and shoes. Instruction was provided on compensatory strategies to perform dressing, toileting, and bathing tasks. OT will continue to follow the pt for further services in the acute care setting. Patient to follow physician's recommendations for discharge plan.       If plan is discharge home, recommend the following:  Assistance with cooking/housework;Assist for transportation;Help with stairs or ramp for entrance;A little help with bathing/dressing/bathroom   Equipment Recommendations  Tub/shower seat    Recommendations for Other Services      Precautions / Restrictions Precautions Precautions:  Fall;Shoulder Type of Shoulder Precautions: okay to shower 3 days post-op, no shoulder ROM Shoulder Interventions: Don joy ultra sling Precaution Booklet Issued: Yes (comment) Restrictions Weight Bearing Restrictions Per Provider Order: Yes RUE Weight Bearing Per Provider Order: Non weight bearing       Mobility Bed Mobility        General bed mobility comments: pt was received seated in the chair    Transfers Overall transfer level: Needs assistance Equipment used: None Transfers: Sit to/from Stand Sit to Stand: Supervision          Balance     Sitting balance-Leahy Scale: Good       Standing balance-Leahy Scale: Good       ADL either performed or assessed with clinical judgement   ADL Overall ADL's : Needs assistance/impaired             Lower Body Bathing: Set up;Supervison/ safety;Sit to/from stand Lower Body Bathing Details (indicate cue type and reason): The pt performed partial lower body bathing in standing at the sink. Upper Body Dressing : Moderate assistance;Sitting;Cueing for sequencing;Cueing for compensatory techniques Upper Body Dressing Details (indicate cue type and reason): OT instructed the pt on proper technique for donning an over head shirt, as well as doffing and donning his RUE sling. Lower Body Dressing: Contact guard assist;Sit to/from stand;Sitting/lateral leans;Cueing for compensatory techniques;Cueing for sequencing Lower Body Dressing Details (indicate cue type and reason): OT instructed the pt on performing as much of lower body dressing tasks as possible in sitting for added safety. He required SBA with cues to donn his shorts, min assist with instruction provided on implementing the 1 handed technique to donn socks, and min assist to donn his tennis shoes.  Toileting - Clothing Manipulation Details (indicate cue type and reason): OT instructed the pt to use non-surgical LUE to perform posterior peri-hygiene, to ensure  adherence to R shoulder ROM restrictions.                      Communication Communication Communication: No apparent difficulties   Cognition Arousal: Alert Behavior During Therapy: WFL for tasks assessed/performed Cognition: No apparent impairments          Following commands: Intact        Cueing   Cueing Techniques: Verbal cues             Pertinent Vitals/ Pain       Pain Assessment Pain Assessment: 0-10 Pain Score: 6  Pain Location: R shoulder Pain Intervention(s): Ice applied, Repositioned, Limited activity within patient's tolerance, Monitored during session   Frequency  Min 2X/week        Progress Toward Goals  OT Goals(current goals can now be found in the care plan section)     Acute Rehab OT Goals OT Goal Formulation: With patient Time For Goal Achievement: 07/20/24 Potential to Achieve Goals: Good  Plan         AM-PAC OT 6 Clicks Daily Activity     Outcome Measure   Help from another person eating meals?: None Help from another person taking care of personal grooming?: None Help from another person toileting, which includes using toliet, bedpan, or urinal?: A Little Help from another person bathing (including washing, rinsing, drying)?: A Little Help from another person to put on and taking off regular upper body clothing?: A Lot Help from another person to put on and taking off regular lower body clothing?: A Little 6 Click Score: 19    End of Session Equipment Utilized During Treatment: Gait belt  OT Visit Diagnosis: Muscle weakness (generalized) (M62.81);History of falling (Z91.81);Pain Pain - Right/Left: Right Pain - part of body: Shoulder   Activity Tolerance Patient tolerated treatment well   Patient Left in chair;with call bell/phone within reach   Nurse Communication Other (comment) (shoulder education completed)        Time: 8871-8789 OT Time Calculation (min): 42 min  Charges: OT General Charges $OT  Visit: 1 Visit OT Treatments $Self Care/Home Management : 23-37 mins    Delanna JINNY Lesches, OTR/L 07/08/2024, 2:37 PM

## 2024-07-14 MED FILL — Hydromorphone HCl Inj 2 MG/ML: INTRAMUSCULAR | Qty: 1 | Status: AC

## 2024-07-22 ENCOUNTER — Emergency Department (HOSPITAL_COMMUNITY): Admission: EM | Admit: 2024-07-22 | Discharge: 2024-07-22 | Disposition: A | Discharge status: Home / Self Care

## 2024-07-22 ENCOUNTER — Other Ambulatory Visit: Payer: Self-pay

## 2024-07-22 ENCOUNTER — Emergency Department (HOSPITAL_COMMUNITY)

## 2024-07-22 ENCOUNTER — Encounter (HOSPITAL_COMMUNITY): Payer: Self-pay | Admitting: Emergency Medicine

## 2024-07-22 DIAGNOSIS — S4991XA Unspecified injury of right shoulder and upper arm, initial encounter: Secondary | ICD-10-CM | POA: Diagnosis present

## 2024-07-22 DIAGNOSIS — S3011XA Contusion of abdominal wall, initial encounter: Secondary | ICD-10-CM | POA: Diagnosis not present

## 2024-07-22 DIAGNOSIS — W19XXXA Unspecified fall, initial encounter: Secondary | ICD-10-CM | POA: Diagnosis not present

## 2024-07-22 DIAGNOSIS — Z7982 Long term (current) use of aspirin: Secondary | ICD-10-CM | POA: Insufficient documentation

## 2024-07-22 DIAGNOSIS — S20211A Contusion of right front wall of thorax, initial encounter: Secondary | ICD-10-CM | POA: Insufficient documentation

## 2024-07-22 DIAGNOSIS — F1910 Other psychoactive substance abuse, uncomplicated: Secondary | ICD-10-CM | POA: Diagnosis not present

## 2024-07-22 DIAGNOSIS — S42294A Other nondisplaced fracture of upper end of right humerus, initial encounter for closed fracture: Secondary | ICD-10-CM | POA: Diagnosis not present

## 2024-07-22 DIAGNOSIS — F1990 Other psychoactive substance use, unspecified, uncomplicated: Secondary | ICD-10-CM

## 2024-07-22 DIAGNOSIS — S3013XA Contusion of flank (latus) region, initial encounter: Secondary | ICD-10-CM

## 2024-07-22 DIAGNOSIS — S42295A Other nondisplaced fracture of upper end of left humerus, initial encounter for closed fracture: Secondary | ICD-10-CM

## 2024-07-22 LAB — CBC WITH DIFFERENTIAL/PLATELET
Abs Immature Granulocytes: 0.04 K/uL (ref 0.00–0.07)
Basophils Absolute: 0.2 K/uL — ABNORMAL HIGH (ref 0.0–0.1)
Basophils Relative: 2 %
Eosinophils Absolute: 0.2 K/uL (ref 0.0–0.5)
Eosinophils Relative: 3 %
HCT: 34.7 % — ABNORMAL LOW (ref 39.0–52.0)
Hemoglobin: 11.1 g/dL — ABNORMAL LOW (ref 13.0–17.0)
Immature Granulocytes: 0 %
Lymphocytes Relative: 26 %
Lymphs Abs: 2.4 K/uL (ref 0.7–4.0)
MCH: 28.5 pg (ref 26.0–34.0)
MCHC: 32 g/dL (ref 30.0–36.0)
MCV: 89 fL (ref 80.0–100.0)
Monocytes Absolute: 0.7 K/uL (ref 0.1–1.0)
Monocytes Relative: 8 %
Neutro Abs: 5.7 K/uL (ref 1.7–7.7)
Neutrophils Relative %: 61 %
Platelets: 215 K/uL (ref 150–400)
RBC: 3.9 MIL/uL — ABNORMAL LOW (ref 4.22–5.81)
RDW: 18.8 % — ABNORMAL HIGH (ref 11.5–15.5)
WBC: 9.2 K/uL (ref 4.0–10.5)
nRBC: 0 % (ref 0.0–0.2)

## 2024-07-22 LAB — BASIC METABOLIC PANEL WITH GFR
Anion gap: 14 (ref 5–15)
BUN: 9 mg/dL (ref 6–20)
CO2: 22 mmol/L (ref 22–32)
Calcium: 9.5 mg/dL (ref 8.9–10.3)
Chloride: 103 mmol/L (ref 98–111)
Creatinine, Ser: 0.88 mg/dL (ref 0.61–1.24)
GFR, Estimated: >60 mL/min (ref >60)
Glucose, Bld: 87 mg/dL (ref 70–99)
Potassium: 3.8 mmol/L (ref 3.5–5.1)
Sodium: 139 mmol/L (ref 135–145)

## 2024-07-22 LAB — PROTIME-INR
INR: 1.1 (ref 0.8–1.2)
Prothrombin Time: 14.5 s (ref 11.4–15.2)

## 2024-07-22 MED ORDER — LIDOCAINE 5 % EX PTCH: 1.0000 | MEDICATED_PATCH | CUTANEOUS | 0 refills | Status: AC

## 2024-07-22 MED ORDER — METHOCARBAMOL 500 MG PO TABS: 500.0000 mg | ORAL_TABLET | Freq: Four times a day (QID) | ORAL | 0 refills | Status: AC | PRN

## 2024-07-22 MED ORDER — ACETAMINOPHEN 500 MG PO TABS
1000.0000 mg | ORAL_TABLET | Freq: Once | ORAL | Status: AC
  Administered 2024-07-22: 1000 mg via ORAL
  Filled 2024-07-22: qty 2

## 2024-07-22 MED ORDER — KETOROLAC TROMETHAMINE 15 MG/ML IJ SOLN
15.0000 mg | Freq: Once | INTRAMUSCULAR | Status: AC
  Administered 2024-07-22: 15 mg via INTRAVENOUS
  Filled 2024-07-22: qty 1

## 2024-07-22 MED ORDER — IOHEXOL 300 MG/ML  SOLN
100.0000 mL | Freq: Once | INTRAMUSCULAR | Status: AC | PRN
  Administered 2024-07-22: 100 mL via INTRAVENOUS

## 2024-07-22 NOTE — ED Provider Notes (Signed)
 [place] [facility]PARTMENT AT [facility] Provider Note   CSN: [id number] Arrival date & time: 07/22/24  9297     Patient presents with: Timothy [name] is a 53 y.o. male.   53 year old male with history of alcohol  abuse presents for evaluation of right side pain.  States he fell a week ago and never got evaluated.  Had a recent right shoulder surgery but states since he fell he has had right side and rib pain as well as right arm pain.  He is on aspirin  but no other blood thinners.  Denies hitting his head or losing consciousness.  Denies any other symptoms or concerns at this time.   Fall Associated symptoms include abdominal pain. Pertinent negatives include no chest pain and no shortness of breath.       Prior to Admission medications   Medication Sig Start Date End Date Taking? Authorizing Provider  lidocaine  (LIDODERM ) 5 % Place 1 patch onto the skin daily. Remove & Discard patch within 12 hours or as directed by MD 07/22/24  Yes Jassmine Vandruff L, DO  methocarbamol  (ROBAXIN ) 500 MG tablet Take 1 tablet (500 mg total) by mouth every 6 (six) hours as needed for up to 7 days for muscle spasms. 07/22/24 07/29/24 Yes Darene Nappi L, DO  acetaminophen  (TYLENOL ) 500 MG tablet Take 1 tablet (500 mg total) by mouth every 8 (eight) hours for 14 days. 07/08/24 07/22/24  McBane, Caroline N, PA-C  albuterol  (VENTOLIN  HFA) 108 (90 Base) MCG/ACT inhaler Inhale 2 puffs into the lungs every 6 (six) hours as needed for wheezing or shortness of breath.    [provider]  aspirin  (ASPIRIN  CHILDRENS) 81 MG chewable tablet Chew 1 tablet (81 mg total) by mouth daily. For 4 weeks for DVT prophylaxis after surgery 07/08/24 08/07/24  Jennye Aleck SAILOR, PA-C  carvedilol  (COREG ) 12.5 MG tablet Take 6.25-12.5 mg by mouth See admin instructions. 6.25mg  in the morning and 12.5mg  at bedtime    [provider]  celecoxib (CELEBREX) 200 MG capsule Take 1 capsule (200 mg  total) by mouth 2 (two) times daily for 14 days. 07/08/24 07/22/24  McBane, Aleck SAILOR, PA-C  Cholecalciferol  (VITAMIN D3) 25 MCG (1000 UT) CAPS Take 2 capsules by mouth every evening.    [provider]  cloNIDine  (CATAPRES  - DOSED IN MG/24 HR) 0.3 mg/24hr patch Place 0.3 mg onto the skin once a week. 04/14/24   [provider]  cyclobenzaprine (FLEXERIL) 10 MG tablet Take 10 mg by mouth 2 (two) times daily. 06/03/24   [provider]  folic acid  (FOLVITE ) 1 MG tablet Take 1 tablet (1 mg total) by mouth daily. 12/21/20   Pearlean Manus, MD  furosemide  (LASIX ) 20 MG tablet Take 1 tablet (20 mg total) by mouth daily. Patient taking differently: Take 20-40 mg by mouth daily. 05/13/23   Evonnie Lenis, MD  gabapentin  (NEURONTIN ) 100 MG capsule Take 2 capsules (200 mg total) by mouth 3 (three) times daily. Patient taking differently: Take 200 mg by mouth 2 (two) times daily. 05/12/23   Evonnie Lenis, MD  hydrOXYzine  (ATARAX /VISTARIL ) 25 MG tablet Take 1 tablet (25 mg total) by mouth 3 (three) times daily as needed for anxiety or nausea. 12/20/20   Pearlean Manus, MD  lactulose  (CHRONULAC ) 10 GM/15ML solution Take 45 mLs (30 g total) by mouth 2 (two) times daily. Patient taking differently: Take 30 g by mouth 2 (two) times daily as needed for moderate constipation or mild  constipation. 05/12/23   Evonnie Lenis, MD  lubiprostone (AMITIZA) 8 MCG capsule Take 8 mcg by mouth daily as needed for constipation. 05/23/23   [provider]  pantoprazole  (PROTONIX ) 40 MG tablet Take 40 mg by mouth 2 (two) times daily. 11/06/22   [provider]  potassium chloride  SA (KLOR-CON  M) 20 MEQ tablet Take 2 tablets (40 mEq total) by mouth daily. Patient taking differently: Take 40 mEq by mouth every other day. 05/13/23   Evonnie Lenis, MD  spironolactone  (ALDACTONE ) 50 MG tablet Take 1 tablet (50 mg total) by mouth daily. 05/13/23   Evonnie Lenis, MD  thiamine  (VITAMIN B-1) 100 MG tablet Take 1 tablet  (100 mg total) by mouth daily. 09/21/22   Dennise Lavada POUR, MD  traZODone  (DESYREL ) 100 MG tablet Take 1 tablet (100 mg total) by mouth at bedtime. Patient taking differently: Take 100 mg by mouth at bedtime as needed for sleep. 05/12/23   Evonnie Lenis, MD    Allergies: Patient has no active allergies.    Review of Systems  Constitutional:  Negative for chills and fever.  HENT:  Negative for ear pain and sore throat.   Eyes:  Negative for pain and visual disturbance.  Respiratory:  Negative for cough and shortness of breath.   Cardiovascular:  Negative for chest pain and palpitations.  Gastrointestinal:  Positive for abdominal pain. Negative for vomiting.  Genitourinary:  Negative for dysuria and hematuria.  Musculoskeletal:  Positive for back pain. Negative for arthralgias.  Skin:  Negative for color change and rash.  Neurological:  Negative for seizures and syncope.  All other systems reviewed and are negative.   Updated Vital Signs BP (!) 159/101   Pulse 97   Temp 98.6 F (37 C) (Oral)   Resp 18   Ht 6' (1.829 m)   Wt 92 kg   SpO2 96%   BMI 27.51 kg/m   Physical Exam Vitals and nursing note reviewed.  Constitutional:      General: He is not in acute distress.    Appearance: Normal appearance. He is well-developed. He is ill-appearing.     Comments: Chronically ill-appearing  HENT:     Head: Normocephalic and atraumatic.  Eyes:     Conjunctiva/sclera: Conjunctivae normal.  Cardiovascular:     Rate and Rhythm: Normal rate and regular rhythm.     Heart sounds: No murmur heard. Pulmonary:     Effort: Pulmonary effort is normal. No respiratory distress.     Breath sounds: Normal breath sounds.  Abdominal:     Palpations: Abdomen is soft.     Tenderness: There is no abdominal tenderness.  Musculoskeletal:        General: Tenderness present. No swelling.     Cervical back: Neck supple.     Comments: There is a well appearing well-healed surgical incision on the right  shoulder but bruising around the humerus, there is also bruising to the right lateral and right posterior ribs as well as the right abdomen.  There is tenderness to palpation of the ribs and abdomen  Skin:    General: Skin is warm and dry.     Capillary Refill: Capillary refill takes less than 2 seconds.  Neurological:     Mental Status: He is alert.  Psychiatric:        Mood and Affect: Mood normal.     (all labs ordered are listed, but only abnormal results are displayed) Labs Reviewed  CBC WITH DIFFERENTIAL/PLATELET - Abnormal; Notable for  the following components:      Result Value   RBC 3.90 (*)    Hemoglobin 11.1 (*)    HCT 34.7 (*)    RDW 18.8 (*)    Basophils Absolute 0.2 (*)    All other components within normal limits  BASIC METABOLIC PANEL WITH GFR  PROTIME-INR    EKG: None  Radiology: CT CHEST ABDOMEN PELVIS W CONTRAST Result Date: 07/22/2024 EXAM: CT CHEST, ABDOMEN AND PELVIS WITH CONTRAST 07/22/2024 09:48:00 AM TECHNIQUE: CT of the chest, abdomen and pelvis was performed with the administration of 100 mL of iohexol  (OMNIPAQUE ) 300 MG/ML solution. Multiplanar reformatted images are provided for review. Automated exposure control, iterative reconstruction, and/or weight based adjustment of the mA/kV was utilized to reduce the radiation dose to as low as reasonably achievable. COMPARISON: None available. CLINICAL HISTORY: fall, bruising pain FINDINGS: CHEST: MEDIASTINUM AND LYMPH NODES: Heart and pericardium are unremarkable. The central airways are clear. No mediastinal, hilar or axillary lymphadenopathy. LUNGS AND PLEURA: No focal consolidation or pulmonary edema. No pleural effusion or pneumothorax. ABDOMEN AND PELVIS: LIVER: Cirrhotic morphology of the liver. No intrahepatic mass. Hepatic steatosis. GALLBLADDER AND BILE DUCTS: Decompressed gallbladder without radiopaque stones. No biliary ductal dilatation. SPLEEN: No acute abnormality. PANCREAS: No acute abnormality.  ADRENAL GLANDS: No acute abnormality. KIDNEYS, URETERS AND BLADDER: Subcentimeter hypodensity in the left interpolar region too small to definitively characterize, but likely a small renal cyst. Normal variant retroaortic left renal vein. No stones in the kidneys or ureters. No hydronephrosis. No perinephric or periureteral stranding. Urinary bladder is unremarkable. GI AND BOWEL: Stomach demonstrates no acute abnormality. Mild wall thickening of the ascending and proximal transverse colon. Inflammatory stranding along the right paracolic gutter. There is no bowel obstruction. REPRODUCTIVE ORGANS: No prostatomegaly. No acute abnormality. PERITONEUM AND RETROPERITONEUM: No ascites. No free air. No free pelvic fluid. VASCULATURE: Dense multivessel chronic atherosclerosis. 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 Diffuse aortoiliac atherosclerosis. ABDOMINAL AND PELVIS LYMPH NODES: No lymphadenopathy. BONES AND SOFT TISSUES: Unchanged mild compression fracture of the superior endplate of L1. Moderate volume of symmetric bilateral gynecomastia. Right glenohumeral joint arthroplasty is partially visualized without dislocation. The proximal humeral fracture is also partially visualized. osteopenia. Volume symmetric bilateral gynecomastia. No focal soft tissue abnormality. IMPRESSION: 1. Mild thickening of the ascending and proximal transverse colon, which may be due to portal colopathy versus an infectious or inflammatory colitis. Clinical correlation requested. 2. Cirrhosis with hepatic steatosis and sequelae of portal hypertension continued biannual HCC surveillance recommended. 3. Unchanged mild superior endplate compression fracture of L1. Electronically signed by: Rogelia Myers MD 07/22/2024 10:43 AM EST RP Workstation: [id number]   DG Humerus Right Result Date: 07/22/2024 EXAM: 1 VIEW(S) XRAY OF THE RIGHT HUMERUS 07/22/2024 08:03:00 AM COMPARISON: Comparison studies dated  07/07/2024 and 07/03/2024. CLINICAL HISTORY: fall, pain FINDINGS: BONES AND JOINTS: The right shoulder glenohumeral arthroplasty is anatomically aligned without dislocation. Redemonstrated, healing fracture of the proximal humerus metaphysis. No acute fracture of the distal humerus. SOFT TISSUES: Peripheral vascular atherosclerosis. IMPRESSION: 1. 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 2. Healing fracture of the proximal humeral metaphysis. Electronically signed by: Rogelia Myers MD 07/22/2024 08:17 AM EST RP Workstation: [id number]     Procedures   Medications Ordered in the ED  acetaminophen  (TYLENOL ) tablet 1,000 mg (1,000 mg Oral Given 07/22/24 0759)  iohexol  (OMNIPAQUE ) 300 MG/ML solution 100 mL (100 mLs Intravenous Contrast Given 07/22/24 0938)  ketorolac  (TORADOL ) 15 MG/ML injection 15 mg (15 mg Intravenous  Given 07/22/24 1013)                                    Medical Decision Making Cardiac monitor interpretation: Sinus rhythm, no ectopy  Social determinants of health: Patient has a history of daily alcohol  abuse  Patient here for pain after a fall that was a week ago.  He has bruising on his right flank, right upper extremity which she just recently had surgery for humeral fracture and also has right posterior ribs.  Imaging is negative.  Given Tylenol  for pain.  Counseled on cessation of alcohol  and advised Tylenol  as needed for pain.  Will also give prescription for muscle relaxer.  Advised to stay in his sling/arm brace and follow-up with orthopedic surgery.  Advised return to the ER for new or worsening symptoms.  He feels comfortable being discharged home.  Problems Addressed: Fall, initial encounter: acute illness or injury Other closed nondisplaced fracture of proximal end of left humerus, initial encounter: acute illness or injury Substance use disorder: chronic illness or injury Traumatic ecchymosis of flank, initial encounter: acute  illness or injury  Amount and/or Complexity of Data Reviewed External Data Reviewed: notes.    Details: Prior ED and hospital records reviewed and patient admitted 07-03-2024 for right humeral fracture Labs: ordered. Decision-making details documented in ED Course.    Details: Ordered and reviewed by me and unremarkable Radiology: ordered and independent interpretation performed. Decision-making details documented in ED Course.    Details: Ordered and reviewed by me Right humerus x-ray: Shows old healed fracture of right humerus CT chest abdomen pelvis: Shows no acute abnormality  Risk OTC drugs. Prescription drug management. Diagnosis or treatment significantly limited by social determinants of health.     Final diagnoses:  Other closed nondisplaced fracture of proximal end of left humerus, initial encounter  Fall, initial encounter  Traumatic ecchymosis of flank, initial encounter  Substance use disorder    ED Discharge Orders          Ordered    methocarbamol  (ROBAXIN ) 500 MG tablet  Every 6 hours PRN        07/22/24 1058    lidocaine  (LIDODERM ) 5 %  Every 24 hours        07/22/24 1058               Leidy Massar, [place] L, DO 07/22/24 1339

## 2024-07-22 NOTE — Discharge Instructions (Addendum)
 Use your lidocaine  patches and take Robaxin  as needed for pain.  You can also alternate Tylenol  and Motrin  every 3 hours as needed for pain.  Call and follow-up with your primary care doctor and your orthopedic surgeon.  Wear your brace/sling until you follow-up with Ortho.

## 2024-07-22 NOTE — ED Triage Notes (Signed)
 PT BIB [facility] for a fall x 1 week ago. C/o or right rib pain. Per EMS ETOH on board.
